# Patient Record
Sex: Female | Born: 1935
Health system: Southern US, Community
[De-identification: ages and names within clinical notes are randomized; demographics above are authoritative.]

## PROBLEM LIST (undated history)

## (undated) DIAGNOSIS — M419 Scoliosis, unspecified: Secondary | ICD-10-CM

## (undated) DIAGNOSIS — K219 Gastro-esophageal reflux disease without esophagitis: Secondary | ICD-10-CM

## (undated) DIAGNOSIS — G8929 Other chronic pain: Secondary | ICD-10-CM

## (undated) DIAGNOSIS — J449 Chronic obstructive pulmonary disease, unspecified: Secondary | ICD-10-CM

## (undated) DIAGNOSIS — E785 Hyperlipidemia, unspecified: Secondary | ICD-10-CM

## (undated) DIAGNOSIS — J189 Pneumonia, unspecified organism: Secondary | ICD-10-CM

## (undated) DIAGNOSIS — R112 Nausea with vomiting, unspecified: Secondary | ICD-10-CM

## (undated) DIAGNOSIS — M1990 Unspecified osteoarthritis, unspecified site: Secondary | ICD-10-CM

## (undated) DIAGNOSIS — J989 Respiratory disorder, unspecified: Secondary | ICD-10-CM

## (undated) DIAGNOSIS — R519 Headache, unspecified: Secondary | ICD-10-CM

## (undated) DIAGNOSIS — R0989 Other specified symptoms and signs involving the circulatory and respiratory systems: Secondary | ICD-10-CM

## (undated) DIAGNOSIS — K5100 Ulcerative (chronic) pancolitis without complications: Secondary | ICD-10-CM

## (undated) DIAGNOSIS — I509 Heart failure, unspecified: Secondary | ICD-10-CM

## (undated) DIAGNOSIS — K279 Peptic ulcer, site unspecified, unspecified as acute or chronic, without hemorrhage or perforation: Secondary | ICD-10-CM

## (undated) DIAGNOSIS — R51 Headache: Secondary | ICD-10-CM

## (undated) DIAGNOSIS — I499 Cardiac arrhythmia, unspecified: Secondary | ICD-10-CM

## (undated) DIAGNOSIS — N390 Urinary tract infection, site not specified: Secondary | ICD-10-CM

## (undated) DIAGNOSIS — K8050 Calculus of bile duct without cholangitis or cholecystitis without obstruction: Secondary | ICD-10-CM

## (undated) DIAGNOSIS — K529 Noninfective gastroenteritis and colitis, unspecified: Secondary | ICD-10-CM

## (undated) DIAGNOSIS — Z9889 Other specified postprocedural states: Secondary | ICD-10-CM

## (undated) DIAGNOSIS — K859 Acute pancreatitis without necrosis or infection, unspecified: Secondary | ICD-10-CM

## (undated) DIAGNOSIS — Z9981 Dependence on supplemental oxygen: Secondary | ICD-10-CM

## (undated) DIAGNOSIS — K449 Diaphragmatic hernia without obstruction or gangrene: Secondary | ICD-10-CM

## (undated) DIAGNOSIS — D649 Anemia, unspecified: Secondary | ICD-10-CM

## (undated) DIAGNOSIS — M549 Dorsalgia, unspecified: Secondary | ICD-10-CM

## (undated) DIAGNOSIS — I10 Essential (primary) hypertension: Secondary | ICD-10-CM

## (undated) HISTORY — DX: Noninfective gastroenteritis and colitis, unspecified: K52.9

## (undated) HISTORY — PX: ABDOMINAL HYSTERECTOMY: SHX81

## (undated) HISTORY — DX: Chronic obstructive pulmonary disease, unspecified: J44.9

## (undated) HISTORY — DX: Hyperlipidemia, unspecified: E78.5

## (undated) HISTORY — DX: Anemia, unspecified: D64.9

## (undated) HISTORY — PX: SPINAL CORD STIMULATOR IMPLANT: SHX2422

## (undated) HISTORY — DX: Essential (primary) hypertension: I10

## (undated) HISTORY — DX: Peptic ulcer, site unspecified, unspecified as acute or chronic, without hemorrhage or perforation: K27.9

## (undated) HISTORY — DX: Gastro-esophageal reflux disease without esophagitis: K21.9

## (undated) HISTORY — DX: Calculus of bile duct without cholangitis or cholecystitis without obstruction: K80.50

## (undated) HISTORY — PX: SPINE SURGERY: SHX786

## (undated) HISTORY — DX: Scoliosis, unspecified: M41.9

## (undated) HISTORY — DX: Diaphragmatic hernia without obstruction or gangrene: K44.9

## (undated) HISTORY — DX: Pneumonia, unspecified organism: J18.9

## (undated) HISTORY — PX: CERVICAL LAMINECTOMY: SHX94

## (undated) HISTORY — DX: Cardiac arrhythmia, unspecified: I49.9

## (undated) HISTORY — PX: BACK SURGERY: SHX140

## (undated) HISTORY — DX: Ulcerative (chronic) pancolitis without complications: K51.00

## (undated) HISTORY — PX: PAIN PUMP IMPLANTATION: SHX330

## (undated) SURGERY — EGD (ESOPHAGOGASTRODUODENOSCOPY)
Anesthesia: Moderate Sedation

---

## 1958-09-28 HISTORY — PX: BREAST CYST EXCISION: SHX579

## 1997-06-30 ENCOUNTER — Ambulatory Visit (HOSPITAL_COMMUNITY): Admission: RE | Admit: 1997-06-30 | Discharge: 1997-07-01 | Payer: Self-pay | Admitting: Neurosurgery

## 1997-07-14 ENCOUNTER — Ambulatory Visit (HOSPITAL_COMMUNITY): Admission: RE | Admit: 1997-07-14 | Discharge: 1997-07-15 | Payer: Self-pay | Admitting: Neurosurgery

## 1997-07-28 ENCOUNTER — Ambulatory Visit (HOSPITAL_COMMUNITY): Admission: RE | Admit: 1997-07-28 | Discharge: 1997-07-28 | Payer: Self-pay | Admitting: Neurosurgery

## 1997-11-08 ENCOUNTER — Encounter: Admission: RE | Admit: 1997-11-08 | Discharge: 1998-02-06 | Payer: Self-pay | Admitting: Neurosurgery

## 1997-12-19 ENCOUNTER — Ambulatory Visit (HOSPITAL_COMMUNITY): Admission: RE | Admit: 1997-12-19 | Discharge: 1997-12-19 | Payer: Self-pay | Admitting: Neurosurgery

## 2000-01-03 ENCOUNTER — Ambulatory Visit (HOSPITAL_COMMUNITY): Admission: RE | Admit: 2000-01-03 | Discharge: 2000-01-03 | Payer: Self-pay | Admitting: Neurosurgery

## 2001-03-01 ENCOUNTER — Ambulatory Visit (HOSPITAL_COMMUNITY): Admission: RE | Admit: 2001-03-01 | Discharge: 2001-03-01 | Payer: Self-pay | Admitting: Neurosurgery

## 2001-03-01 ENCOUNTER — Encounter: Payer: Self-pay | Admitting: Neurosurgery

## 2001-03-19 ENCOUNTER — Encounter: Payer: Self-pay | Admitting: Neurosurgery

## 2001-03-23 ENCOUNTER — Encounter: Payer: Self-pay | Admitting: Neurosurgery

## 2001-03-23 ENCOUNTER — Inpatient Hospital Stay (HOSPITAL_COMMUNITY): Admission: RE | Admit: 2001-03-23 | Discharge: 2001-04-01 | Payer: Self-pay | Admitting: Neurosurgery

## 2001-09-08 ENCOUNTER — Encounter: Payer: Self-pay | Admitting: Neurosurgery

## 2001-09-08 ENCOUNTER — Ambulatory Visit (HOSPITAL_COMMUNITY): Admission: RE | Admit: 2001-09-08 | Discharge: 2001-09-08 | Payer: Self-pay | Admitting: Neurosurgery

## 2001-09-17 ENCOUNTER — Encounter: Payer: Self-pay | Admitting: Neurosurgery

## 2001-09-17 ENCOUNTER — Encounter: Admission: RE | Admit: 2001-09-17 | Discharge: 2001-09-17 | Payer: Self-pay | Admitting: Neurosurgery

## 2001-10-01 ENCOUNTER — Encounter: Payer: Self-pay | Admitting: Neurosurgery

## 2001-10-01 ENCOUNTER — Encounter: Admission: RE | Admit: 2001-10-01 | Discharge: 2001-10-01 | Payer: Self-pay | Admitting: Neurosurgery

## 2001-10-15 ENCOUNTER — Encounter: Admission: RE | Admit: 2001-10-15 | Discharge: 2001-10-15 | Payer: Self-pay | Admitting: Neurosurgery

## 2001-10-15 ENCOUNTER — Encounter: Payer: Self-pay | Admitting: Neurosurgery

## 2002-04-11 ENCOUNTER — Encounter: Admission: RE | Admit: 2002-04-11 | Discharge: 2002-04-11 | Payer: Self-pay | Admitting: Neurosurgery

## 2002-04-11 ENCOUNTER — Encounter: Payer: Self-pay | Admitting: Neurosurgery

## 2002-04-26 ENCOUNTER — Encounter: Payer: Self-pay | Admitting: Neurosurgery

## 2002-04-26 ENCOUNTER — Encounter: Admission: RE | Admit: 2002-04-26 | Discharge: 2002-04-26 | Payer: Self-pay | Admitting: Neurosurgery

## 2002-05-12 ENCOUNTER — Encounter: Payer: Self-pay | Admitting: Neurosurgery

## 2002-05-12 ENCOUNTER — Encounter: Admission: RE | Admit: 2002-05-12 | Discharge: 2002-05-12 | Payer: Self-pay | Admitting: Neurosurgery

## 2003-01-25 ENCOUNTER — Encounter: Admission: RE | Admit: 2003-01-25 | Discharge: 2003-01-25 | Payer: Self-pay | Admitting: Internal Medicine

## 2003-06-30 ENCOUNTER — Ambulatory Visit (HOSPITAL_COMMUNITY): Admission: RE | Admit: 2003-06-30 | Discharge: 2003-06-30 | Payer: Self-pay | Admitting: Neurosurgery

## 2003-08-01 ENCOUNTER — Inpatient Hospital Stay (HOSPITAL_COMMUNITY): Admission: RE | Admit: 2003-08-01 | Discharge: 2003-08-17 | Payer: Self-pay | Admitting: Neurosurgery

## 2003-08-04 ENCOUNTER — Encounter: Payer: Self-pay | Admitting: Neurosurgery

## 2005-06-26 ENCOUNTER — Ambulatory Visit: Payer: Self-pay | Admitting: Internal Medicine

## 2005-07-18 ENCOUNTER — Ambulatory Visit: Payer: Self-pay | Admitting: Internal Medicine

## 2005-07-18 ENCOUNTER — Ambulatory Visit: Payer: Self-pay | Admitting: Cardiovascular Disease

## 2005-08-07 ENCOUNTER — Ambulatory Visit: Payer: Self-pay | Admitting: Internal Medicine

## 2005-11-05 ENCOUNTER — Ambulatory Visit: Payer: Self-pay | Admitting: Internal Medicine

## 2006-01-06 LAB — CONVERTED CEMR LAB

## 2006-01-27 HISTORY — PX: CHOLECYSTECTOMY: SHX55

## 2008-03-13 ENCOUNTER — Encounter: Admission: RE | Admit: 2008-03-13 | Discharge: 2008-03-13 | Payer: Self-pay | Admitting: Cardiology

## 2008-12-01 ENCOUNTER — Encounter: Admission: RE | Admit: 2008-12-01 | Discharge: 2008-12-01 | Payer: Self-pay | Admitting: Cardiology

## 2008-12-05 ENCOUNTER — Telehealth (INDEPENDENT_AMBULATORY_CARE_PROVIDER_SITE_OTHER): Payer: Self-pay | Admitting: *Deleted

## 2009-01-20 ENCOUNTER — Ambulatory Visit: Payer: Self-pay | Admitting: Diagnostic Radiology

## 2009-01-20 ENCOUNTER — Encounter: Payer: Self-pay | Admitting: Emergency Medicine

## 2009-01-20 ENCOUNTER — Inpatient Hospital Stay (HOSPITAL_COMMUNITY): Admission: EM | Admit: 2009-01-20 | Discharge: 2009-01-22 | Payer: Self-pay | Admitting: Internal Medicine

## 2009-01-22 ENCOUNTER — Telehealth: Payer: Self-pay | Admitting: Pulmonary Disease

## 2009-01-23 ENCOUNTER — Telehealth: Payer: Self-pay | Admitting: Internal Medicine

## 2009-02-01 ENCOUNTER — Ambulatory Visit: Payer: Self-pay | Admitting: Pulmonary Disease

## 2009-02-01 ENCOUNTER — Telehealth: Payer: Self-pay | Admitting: Internal Medicine

## 2009-02-01 ENCOUNTER — Ambulatory Visit: Payer: Self-pay | Admitting: Diagnostic Radiology

## 2009-02-01 ENCOUNTER — Ambulatory Visit (HOSPITAL_BASED_OUTPATIENT_CLINIC_OR_DEPARTMENT_OTHER): Admission: RE | Admit: 2009-02-01 | Discharge: 2009-02-01 | Payer: Self-pay | Admitting: Critical Care Medicine

## 2009-02-01 DIAGNOSIS — K279 Peptic ulcer, site unspecified, unspecified as acute or chronic, without hemorrhage or perforation: Secondary | ICD-10-CM | POA: Insufficient documentation

## 2009-02-01 DIAGNOSIS — M1990 Unspecified osteoarthritis, unspecified site: Secondary | ICD-10-CM | POA: Insufficient documentation

## 2009-02-01 DIAGNOSIS — K219 Gastro-esophageal reflux disease without esophagitis: Secondary | ICD-10-CM | POA: Insufficient documentation

## 2009-02-01 DIAGNOSIS — E7800 Pure hypercholesterolemia, unspecified: Secondary | ICD-10-CM | POA: Insufficient documentation

## 2009-02-01 DIAGNOSIS — K449 Diaphragmatic hernia without obstruction or gangrene: Secondary | ICD-10-CM | POA: Insufficient documentation

## 2009-02-01 DIAGNOSIS — I499 Cardiac arrhythmia, unspecified: Secondary | ICD-10-CM | POA: Insufficient documentation

## 2009-02-01 DIAGNOSIS — I10 Essential (primary) hypertension: Secondary | ICD-10-CM | POA: Insufficient documentation

## 2009-02-01 DIAGNOSIS — I5032 Chronic diastolic (congestive) heart failure: Secondary | ICD-10-CM | POA: Insufficient documentation

## 2009-02-01 DIAGNOSIS — J439 Emphysema, unspecified: Secondary | ICD-10-CM | POA: Insufficient documentation

## 2009-02-08 ENCOUNTER — Ambulatory Visit: Payer: Self-pay | Admitting: Critical Care Medicine

## 2009-02-09 ENCOUNTER — Encounter: Payer: Self-pay | Admitting: Critical Care Medicine

## 2009-02-12 ENCOUNTER — Ambulatory Visit (HOSPITAL_BASED_OUTPATIENT_CLINIC_OR_DEPARTMENT_OTHER): Admission: RE | Admit: 2009-02-12 | Discharge: 2009-02-12 | Payer: Self-pay | Admitting: Internal Medicine

## 2009-02-12 ENCOUNTER — Ambulatory Visit: Payer: Self-pay | Admitting: Diagnostic Radiology

## 2009-02-12 ENCOUNTER — Telehealth: Payer: Self-pay | Admitting: Critical Care Medicine

## 2009-02-12 ENCOUNTER — Ambulatory Visit: Payer: Self-pay | Admitting: Internal Medicine

## 2009-02-12 DIAGNOSIS — M810 Age-related osteoporosis without current pathological fracture: Secondary | ICD-10-CM | POA: Insufficient documentation

## 2009-02-15 ENCOUNTER — Telehealth: Payer: Self-pay | Admitting: Internal Medicine

## 2009-02-15 ENCOUNTER — Encounter: Payer: Self-pay | Admitting: Internal Medicine

## 2009-02-16 ENCOUNTER — Telehealth: Payer: Self-pay | Admitting: Internal Medicine

## 2009-02-21 ENCOUNTER — Telehealth: Payer: Self-pay | Admitting: Internal Medicine

## 2009-02-22 ENCOUNTER — Telehealth: Payer: Self-pay | Admitting: Internal Medicine

## 2009-02-22 ENCOUNTER — Ambulatory Visit: Payer: Self-pay | Admitting: Critical Care Medicine

## 2009-02-23 ENCOUNTER — Telehealth (INDEPENDENT_AMBULATORY_CARE_PROVIDER_SITE_OTHER): Payer: Self-pay | Admitting: *Deleted

## 2009-03-01 ENCOUNTER — Ambulatory Visit: Payer: Self-pay | Admitting: Critical Care Medicine

## 2009-03-02 ENCOUNTER — Telehealth: Payer: Self-pay | Admitting: Internal Medicine

## 2009-03-08 ENCOUNTER — Telehealth: Payer: Self-pay | Admitting: Critical Care Medicine

## 2009-03-15 ENCOUNTER — Ambulatory Visit: Payer: Self-pay | Admitting: Critical Care Medicine

## 2009-03-15 ENCOUNTER — Encounter: Payer: Self-pay | Admitting: Adult Health

## 2009-03-15 LAB — CONVERTED CEMR LAB
BUN: 13 mg/dL (ref 6–23)
CO2: 36 meq/L — ABNORMAL HIGH (ref 19–32)
Calcium: 8.4 mg/dL (ref 8.4–10.5)
Chloride: 102 meq/L (ref 96–112)
Creatinine, Ser: 0.8 mg/dL (ref 0.4–1.2)
GFR calc non Af Amer: 73.01 mL/min (ref >60)
Glucose, Bld: 91 mg/dL (ref 70–99)
Potassium: 4.5 meq/L (ref 3.5–5.1)
Sodium: 142 meq/L (ref 135–145)

## 2009-03-16 ENCOUNTER — Telehealth: Payer: Self-pay | Admitting: Critical Care Medicine

## 2009-03-20 LAB — CONVERTED CEMR LAB: Vit D, 25-Hydroxy: 15 ng/mL — ABNORMAL LOW (ref 30–89)

## 2009-04-02 ENCOUNTER — Encounter: Payer: Self-pay | Admitting: Internal Medicine

## 2009-04-02 ENCOUNTER — Encounter (HOSPITAL_COMMUNITY): Admission: RE | Admit: 2009-04-02 | Discharge: 2009-07-01 | Payer: Self-pay | Admitting: Critical Care Medicine

## 2009-04-04 ENCOUNTER — Ambulatory Visit (HOSPITAL_COMMUNITY): Admission: RE | Admit: 2009-04-04 | Discharge: 2009-04-04 | Payer: Self-pay | Admitting: Critical Care Medicine

## 2009-04-09 ENCOUNTER — Ambulatory Visit: Payer: Self-pay | Admitting: Internal Medicine

## 2009-04-09 LAB — CONVERTED CEMR LAB
ALT: 12 units/L (ref 0–35)
AST: 20 units/L (ref 0–37)
Albumin: 3.7 g/dL (ref 3.5–5.2)
Alkaline Phosphatase: 77 units/L (ref 39–117)
BUN: 15 mg/dL (ref 6–23)
Basophils Absolute: 0 10*3/uL (ref 0.0–0.1)
Basophils Relative: 0 % (ref 0–1)
Bilirubin, Direct: <0.1 mg/dL (ref 0.0–0.3)
CO2: 30 meq/L (ref 19–32)
Calcium: 8.2 mg/dL — ABNORMAL LOW (ref 8.4–10.5)
Chloride: 102 meq/L (ref 96–112)
Cholesterol: 151 mg/dL (ref 0–200)
Creatinine, Ser: 0.78 mg/dL (ref 0.40–1.20)
Eosinophils Absolute: 0.1 10*3/uL (ref 0.0–0.7)
Eosinophils Relative: 3 % (ref 0–5)
Glucose, Bld: 79 mg/dL (ref 70–99)
HCT: 35.2 % — ABNORMAL LOW (ref 36.0–46.0)
HDL: 39 mg/dL — ABNORMAL LOW (ref >39)
Hemoglobin: 10.7 g/dL — ABNORMAL LOW (ref 12.0–15.0)
LDL Cholesterol: 87 mg/dL (ref 0–99)
Lymphocytes Relative: 25 % (ref 12–46)
Lymphs Abs: 1 10*3/uL (ref 0.7–4.0)
MCHC: 30.4 g/dL (ref 30.0–36.0)
MCV: 94.9 fL (ref 78.0–100.0)
Monocytes Absolute: 0.4 10*3/uL (ref 0.1–1.0)
Monocytes Relative: 10 % (ref 3–12)
Neutro Abs: 2.5 10*3/uL (ref 1.7–7.7)
Neutrophils Relative %: 63 % (ref 43–77)
Platelets: 159 10*3/uL (ref 150–400)
Potassium: 4.6 meq/L (ref 3.5–5.3)
RBC: 3.71 M/uL — ABNORMAL LOW (ref 3.87–5.11)
RDW: 14.2 % (ref 11.5–15.5)
Sodium: 142 meq/L (ref 135–145)
TSH: 2.074 microintl units/mL (ref 0.350–4.500)
Total Bilirubin: 0.2 mg/dL — ABNORMAL LOW (ref 0.3–1.2)
Total CHOL/HDL Ratio: 3.9
Total Protein: 5.8 g/dL — ABNORMAL LOW (ref 6.0–8.3)
Triglycerides: 125 mg/dL (ref <150)
VLDL: 25 mg/dL (ref 0–40)
Vit D, 1,25-Dihydroxy: 22 — ABNORMAL LOW (ref 30–89)
WBC: 4.1 10*3/uL (ref 4.0–10.5)

## 2009-04-12 ENCOUNTER — Ambulatory Visit: Payer: Self-pay | Admitting: Internal Medicine

## 2009-04-12 ENCOUNTER — Telehealth: Payer: Self-pay | Admitting: Critical Care Medicine

## 2009-04-12 DIAGNOSIS — I279 Pulmonary heart disease, unspecified: Secondary | ICD-10-CM | POA: Insufficient documentation

## 2009-04-13 LAB — CONVERTED CEMR LAB: Pro B Natriuretic peptide (BNP): 111 pg/mL — ABNORMAL HIGH (ref 0.0–100.0)

## 2009-04-16 ENCOUNTER — Ambulatory Visit: Payer: Self-pay | Admitting: Internal Medicine

## 2009-04-16 LAB — HM MAMMOGRAPHY: HM Mammogram: NORMAL

## 2009-04-16 LAB — CONVERTED CEMR LAB

## 2009-05-03 ENCOUNTER — Telehealth: Payer: Self-pay | Admitting: Internal Medicine

## 2009-05-10 ENCOUNTER — Ambulatory Visit: Payer: Self-pay | Admitting: Critical Care Medicine

## 2009-05-10 DIAGNOSIS — R892 Abnormal level of other drugs, medicaments and biological substances in specimens from other organs, systems and tissues: Secondary | ICD-10-CM | POA: Insufficient documentation

## 2009-05-19 ENCOUNTER — Ambulatory Visit: Payer: Self-pay | Admitting: Diagnostic Radiology

## 2009-05-19 ENCOUNTER — Emergency Department (HOSPITAL_BASED_OUTPATIENT_CLINIC_OR_DEPARTMENT_OTHER)
Admission: EM | Admit: 2009-05-19 | Discharge: 2009-05-19 | Payer: Self-pay | Source: Home / Self Care | Admitting: Emergency Medicine

## 2009-06-19 ENCOUNTER — Ambulatory Visit: Payer: Self-pay | Admitting: Internal Medicine

## 2009-06-21 ENCOUNTER — Encounter: Payer: Self-pay | Admitting: Critical Care Medicine

## 2009-07-02 ENCOUNTER — Encounter (HOSPITAL_COMMUNITY): Admission: RE | Admit: 2009-07-02 | Discharge: 2009-07-05 | Payer: Self-pay | Admitting: Critical Care Medicine

## 2009-07-06 ENCOUNTER — Inpatient Hospital Stay (HOSPITAL_COMMUNITY): Admission: EM | Admit: 2009-07-06 | Discharge: 2009-07-09 | Payer: Self-pay | Admitting: Emergency Medicine

## 2009-07-07 ENCOUNTER — Encounter (INDEPENDENT_AMBULATORY_CARE_PROVIDER_SITE_OTHER): Payer: Self-pay | Admitting: Internal Medicine

## 2009-07-11 ENCOUNTER — Telehealth: Payer: Self-pay | Admitting: Internal Medicine

## 2009-07-19 ENCOUNTER — Ambulatory Visit: Payer: Self-pay | Admitting: Critical Care Medicine

## 2009-07-19 ENCOUNTER — Ambulatory Visit: Payer: Self-pay | Admitting: Internal Medicine

## 2009-07-20 ENCOUNTER — Encounter: Payer: Self-pay | Admitting: Internal Medicine

## 2009-07-24 ENCOUNTER — Telehealth: Payer: Self-pay | Admitting: Internal Medicine

## 2009-07-25 ENCOUNTER — Encounter: Payer: Self-pay | Admitting: Internal Medicine

## 2009-07-26 ENCOUNTER — Ambulatory Visit: Payer: Self-pay | Admitting: Internal Medicine

## 2009-07-26 ENCOUNTER — Ambulatory Visit: Payer: Self-pay | Admitting: Diagnostic Radiology

## 2009-07-26 ENCOUNTER — Ambulatory Visit (HOSPITAL_BASED_OUTPATIENT_CLINIC_OR_DEPARTMENT_OTHER): Admission: RE | Admit: 2009-07-26 | Discharge: 2009-07-26 | Payer: Self-pay | Admitting: Internal Medicine

## 2009-07-27 ENCOUNTER — Encounter: Payer: Self-pay | Admitting: Internal Medicine

## 2009-07-27 ENCOUNTER — Telehealth: Payer: Self-pay | Admitting: Internal Medicine

## 2009-08-02 ENCOUNTER — Telehealth: Payer: Self-pay | Admitting: Internal Medicine

## 2009-08-17 ENCOUNTER — Ambulatory Visit: Payer: Self-pay | Admitting: Internal Medicine

## 2009-08-17 ENCOUNTER — Telehealth: Payer: Self-pay | Admitting: Internal Medicine

## 2009-08-17 DIAGNOSIS — H9190 Unspecified hearing loss, unspecified ear: Secondary | ICD-10-CM | POA: Insufficient documentation

## 2009-08-17 DIAGNOSIS — M542 Cervicalgia: Secondary | ICD-10-CM | POA: Insufficient documentation

## 2009-08-20 ENCOUNTER — Encounter (INDEPENDENT_AMBULATORY_CARE_PROVIDER_SITE_OTHER): Payer: Self-pay | Admitting: *Deleted

## 2009-08-24 ENCOUNTER — Encounter: Payer: Self-pay | Admitting: Internal Medicine

## 2009-08-30 ENCOUNTER — Ambulatory Visit: Payer: Self-pay | Admitting: Cardiology

## 2009-09-26 ENCOUNTER — Ambulatory Visit: Payer: Self-pay | Admitting: Diagnostic Radiology

## 2009-09-26 ENCOUNTER — Ambulatory Visit (HOSPITAL_BASED_OUTPATIENT_CLINIC_OR_DEPARTMENT_OTHER): Admission: RE | Admit: 2009-09-26 | Discharge: 2009-09-26 | Payer: Self-pay | Admitting: Internal Medicine

## 2009-09-26 ENCOUNTER — Ambulatory Visit: Payer: Self-pay | Admitting: Family

## 2009-09-26 LAB — CONVERTED CEMR LAB: Rapid Strep: NEGATIVE

## 2009-09-27 ENCOUNTER — Encounter: Payer: Self-pay | Admitting: Adult Health

## 2009-09-28 ENCOUNTER — Ambulatory Visit: Payer: Self-pay | Admitting: Diagnostic Radiology

## 2009-09-28 ENCOUNTER — Ambulatory Visit: Payer: Self-pay | Admitting: Internal Medicine

## 2009-09-28 ENCOUNTER — Telehealth: Payer: Self-pay | Admitting: Critical Care Medicine

## 2009-09-28 ENCOUNTER — Ambulatory Visit (HOSPITAL_BASED_OUTPATIENT_CLINIC_OR_DEPARTMENT_OTHER): Admission: RE | Admit: 2009-09-28 | Discharge: 2009-09-28 | Payer: Self-pay | Admitting: Internal Medicine

## 2009-09-28 DIAGNOSIS — R51 Headache: Secondary | ICD-10-CM | POA: Insufficient documentation

## 2009-09-28 DIAGNOSIS — R05 Cough: Secondary | ICD-10-CM | POA: Insufficient documentation

## 2009-09-28 DIAGNOSIS — R519 Headache, unspecified: Secondary | ICD-10-CM | POA: Insufficient documentation

## 2009-09-28 DIAGNOSIS — R059 Cough, unspecified: Secondary | ICD-10-CM | POA: Insufficient documentation

## 2009-10-04 ENCOUNTER — Ambulatory Visit: Payer: Self-pay | Admitting: Critical Care Medicine

## 2009-10-05 ENCOUNTER — Telehealth: Payer: Self-pay | Admitting: Critical Care Medicine

## 2009-10-18 ENCOUNTER — Encounter: Payer: Self-pay | Admitting: Internal Medicine

## 2009-10-23 ENCOUNTER — Encounter: Payer: Self-pay | Admitting: Internal Medicine

## 2009-10-29 ENCOUNTER — Telehealth: Payer: Self-pay | Admitting: Internal Medicine

## 2009-10-31 ENCOUNTER — Encounter: Payer: Self-pay | Admitting: Critical Care Medicine

## 2009-11-01 ENCOUNTER — Ambulatory Visit: Payer: Self-pay | Admitting: Critical Care Medicine

## 2009-11-01 ENCOUNTER — Encounter: Payer: Self-pay | Admitting: Internal Medicine

## 2009-11-01 ENCOUNTER — Ambulatory Visit (HOSPITAL_BASED_OUTPATIENT_CLINIC_OR_DEPARTMENT_OTHER): Admission: RE | Admit: 2009-11-01 | Discharge: 2009-11-01 | Payer: Self-pay | Admitting: Critical Care Medicine

## 2009-11-01 ENCOUNTER — Ambulatory Visit: Payer: Self-pay | Admitting: Diagnostic Radiology

## 2009-11-14 ENCOUNTER — Telehealth: Payer: Self-pay | Admitting: Internal Medicine

## 2009-12-02 ENCOUNTER — Inpatient Hospital Stay (HOSPITAL_COMMUNITY): Admission: EM | Admit: 2009-12-02 | Discharge: 2009-12-07 | Payer: Self-pay | Admitting: Emergency Medicine

## 2009-12-04 ENCOUNTER — Encounter: Payer: Self-pay | Admitting: Gastroenterology

## 2009-12-14 ENCOUNTER — Telehealth: Payer: Self-pay | Admitting: Critical Care Medicine

## 2009-12-24 ENCOUNTER — Telehealth: Payer: Self-pay | Admitting: Internal Medicine

## 2009-12-27 ENCOUNTER — Ambulatory Visit: Payer: Self-pay | Admitting: Internal Medicine

## 2009-12-27 DIAGNOSIS — K859 Acute pancreatitis without necrosis or infection, unspecified: Secondary | ICD-10-CM | POA: Insufficient documentation

## 2009-12-27 DIAGNOSIS — D649 Anemia, unspecified: Secondary | ICD-10-CM | POA: Insufficient documentation

## 2009-12-28 ENCOUNTER — Encounter (INDEPENDENT_AMBULATORY_CARE_PROVIDER_SITE_OTHER): Payer: Self-pay | Admitting: *Deleted

## 2009-12-31 ENCOUNTER — Encounter: Payer: Self-pay | Admitting: Internal Medicine

## 2010-01-01 ENCOUNTER — Telehealth: Payer: Self-pay | Admitting: Internal Medicine

## 2010-01-03 ENCOUNTER — Ambulatory Visit: Payer: Self-pay | Admitting: Critical Care Medicine

## 2010-01-17 ENCOUNTER — Encounter: Payer: Self-pay | Admitting: Internal Medicine

## 2010-01-24 ENCOUNTER — Inpatient Hospital Stay (HOSPITAL_COMMUNITY)
Admission: EM | Admit: 2010-01-24 | Discharge: 2010-01-28 | Payer: Self-pay | Source: Home / Self Care | Attending: Internal Medicine | Admitting: Internal Medicine

## 2010-01-31 ENCOUNTER — Encounter: Payer: Self-pay | Admitting: Internal Medicine

## 2010-02-05 ENCOUNTER — Ambulatory Visit
Admission: RE | Admit: 2010-02-05 | Discharge: 2010-02-05 | Payer: Self-pay | Source: Home / Self Care | Attending: Internal Medicine | Admitting: Internal Medicine

## 2010-02-07 ENCOUNTER — Ambulatory Visit
Admission: RE | Admit: 2010-02-07 | Discharge: 2010-02-07 | Payer: Self-pay | Source: Home / Self Care | Attending: Critical Care Medicine | Admitting: Critical Care Medicine

## 2010-02-07 ENCOUNTER — Encounter: Payer: Self-pay | Admitting: Critical Care Medicine

## 2010-02-07 ENCOUNTER — Telehealth: Payer: Self-pay | Admitting: Critical Care Medicine

## 2010-02-07 ENCOUNTER — Ambulatory Visit (HOSPITAL_BASED_OUTPATIENT_CLINIC_OR_DEPARTMENT_OTHER)
Admission: RE | Admit: 2010-02-07 | Discharge: 2010-02-07 | Payer: Self-pay | Source: Home / Self Care | Attending: Critical Care Medicine | Admitting: Critical Care Medicine

## 2010-02-08 ENCOUNTER — Telehealth: Payer: Self-pay | Admitting: Critical Care Medicine

## 2010-02-08 LAB — CONVERTED CEMR LAB
ALT: 16 units/L (ref 0–35)
AST: 19 units/L (ref 0–37)
Albumin: 4 g/dL (ref 3.5–5.2)
Alkaline Phosphatase: 94 units/L (ref 39–117)
BUN: 14 mg/dL (ref 6–23)
Basophils Absolute: 0 10*3/uL (ref 0.0–0.1)
Basophils Relative: 0 % (ref 0–1)
Bilirubin, Direct: 0.1 mg/dL (ref 0.0–0.3)
CO2: 32 meq/L (ref 19–32)
Calcium: 9 mg/dL (ref 8.4–10.5)
Chloride: 100 meq/L (ref 96–112)
Creatinine, Ser: 0.67 mg/dL (ref 0.40–1.20)
Eosinophils Absolute: 0.1 10*3/uL (ref 0.0–0.7)
Eosinophils Relative: 2 % (ref 0–5)
Glucose, Bld: 78 mg/dL (ref 70–99)
HCT: 35.6 % — ABNORMAL LOW (ref 36.0–46.0)
Hemoglobin: 11.5 g/dL — ABNORMAL LOW (ref 12.0–15.0)
Indirect Bilirubin: 0.2 mg/dL (ref 0.0–0.9)
Lymphocytes Relative: 20 % (ref 12–46)
Lymphs Abs: 1.1 10*3/uL (ref 0.7–4.0)
MCHC: 32.3 g/dL (ref 30.0–36.0)
MCV: 95.2 fL (ref 78.0–100.0)
Monocytes Absolute: 0.5 10*3/uL (ref 0.1–1.0)
Monocytes Relative: 9 % (ref 3–12)
Neutro Abs: 3.7 10*3/uL (ref 1.7–7.7)
Neutrophils Relative %: 69 % (ref 43–77)
Platelets: 237 10*3/uL (ref 150–400)
Potassium: 4.2 meq/L (ref 3.5–5.3)
RBC: 3.74 M/uL — ABNORMAL LOW (ref 3.87–5.11)
RDW: 14.9 % (ref 11.5–15.5)
Sodium: 142 meq/L (ref 135–145)
Total Bilirubin: 0.3 mg/dL (ref 0.3–1.2)
Total Protein: 6.4 g/dL (ref 6.0–8.3)
WBC: 5.3 10*3/uL (ref 4.0–10.5)

## 2010-02-20 ENCOUNTER — Ambulatory Visit (HOSPITAL_COMMUNITY)
Admission: RE | Admit: 2010-02-20 | Discharge: 2010-02-20 | Payer: Self-pay | Source: Home / Self Care | Attending: Gastroenterology | Admitting: Gastroenterology

## 2010-02-20 ENCOUNTER — Encounter: Payer: Self-pay | Admitting: Internal Medicine

## 2010-02-21 ENCOUNTER — Encounter: Payer: Self-pay | Admitting: Internal Medicine

## 2010-02-21 ENCOUNTER — Ambulatory Visit
Admission: RE | Admit: 2010-02-21 | Discharge: 2010-02-21 | Payer: Self-pay | Source: Home / Self Care | Attending: Critical Care Medicine | Admitting: Critical Care Medicine

## 2010-02-22 NOTE — H&P (Signed)
[name] [name], [name] [name]               ACCOUNT NO.:  [id number]  MEDICAL RECORD NO.:  [id number]          PATIENT TYPE:  INP  LOCATION:  2040                         FACILITY:  [facility]  PHYSICIAN:  Massie Maroon, MD        DATE OF BIRTH:  05/31/1925  DATE OF ADMISSION:  01/24/2010 DATE OF DISCHARGE:                             HISTORY & PHYSICAL   CHIEF COMPLAINT:  Abdominal pain.  HISTORY OF PRESENT ILLNESS:  An 77 year old female with complaints of nausea, vomiting, generalized abdominal pain, and diarrhea.  Patient denies any fever, chills, constipation, bright red blood per rectum. She has had some black stool, but is on iron.  She has not had any recent colonoscopy.  She was recently discharged on 12/09/2009, after admission for mild and acute pancreatitis of unclear etiology.  At that time, on CT scan, there was some colonic wall thickening suspicious for chronic inflammation.  Patient presented to the ED and had a CT scan ofthe abdomen and pelvis which showed pancolitis with infectious or inflammatory process.  Small amount of free pelvic fluid likely related colitis.  There was no evidence of pancreatitis.  There was evidence of prior cholecystectomy with unchanged mild intra and extrahepatic biliary ductal dilation.  There was scoliosis and a small left pleural effusion. Patient will be admitted for workup of colitis.  PAST MEDICAL HISTORY: 1. COPD. 2. Pneumonia. 3. Hypertension. 4. Hyperlipidemia. 5. CHF (ejection fraction 60-65% on cardiac 2-D echo July 07, 2009),     aortic sclerosis. 6. ? Cardiac arrhythmia. 7. Chronic back pain, history of spondylosis. 8. Osteoarthritis. 9. Osteoporosis (status post last treatment March 07, 2009). 10.GERD. 11.Hiatal hernia. 12.? Colitis. 13.Pancreatitis. 14.? MAC infection on CT chest (September 28, 2009).  PAST SURGICAL HISTORY: 1. 2008 cholecystectomy; 2007, 1995, 1970s, back surgeries; 1970s     hysterectomy; 1970s neck  surgery. 2. Spinal cord stimulator surgery.  SOCIAL HISTORY:  The patient does not smoke or drink.  She lives by herself.  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  FAMILY HISTORY:  Diverticulitis, no history of colon cancer, father had heart disease, mother had uterine cancer,  ALLERGIES:  CODEINE, MORPHINE, RECLAST, IV CONTRAST DYE.  MEDICATIONS:  Tandem one p.o. daily, Fioricet 325/50/40 mg one p.o. q.8 h p.r.n., Zofran 8 mg p.o. q.4 h p.r.n., promethazine 25 mg p.o. daily as needed, Dilaudid 4 mg p.o. daily p.r.n., Symbicort 160/4.5 two puffs b.i.d., Ventolin 1 puff t.i.d. p.r.n., Spiriva 18 mcg 1 puff daily, gabapentin 100 mg one p.o. t.i.d., Bystolic 5 mg one half p.o. daily, omeprazole 20 mg one p.o. daily, enteric-coated aspirin 81 mg p.o. daily, vitamin D 50,000 international units one p.o. q. week, vitamin C 500 mg one p.o. daily, Os-Cal plus D one p.o. daily.  REVIEW OF SYSTEMS:  Negative for all 10 organ systems except for pertinent positive as stated above.  PHYSICAL EXAMINATION:  VITAL SIGNS: Temperature 100.0, pulse 106, blood pressure is 161/72, pulse ox is 95-100%, on room air.  HEENT: Anicteric, EOMI, no nystagmus, pupils 1.5 mm, symmetric, direct, consensual near reflexes intact.  Mucous membranes moist.  NECK: No JVD, no bruit, no thyromegaly, no adenopathy.  HEART: Regular rate and rhythm.  S1, S2. No murmurs, gallops, or rubs.  LUNGS: Clear to auscultation bilaterally. ABDOMEN: Soft, nontender, nondistended.  Positive bowel sounds.  There is part of her back pain apparatus relief right side of the belly. EXTREMITIES: No cyanosis, clubbing, or edema.  SKIN: No rashes.  No Cullen sign.  LYMPH NODES: No adenopathy.  NEURO: Nonfocal.  LABORATORY DATA:  BUN 11, creatinine 0.8.  Sodium 135, potassium 3.5, AST 229, ALT 327, alk phos 217, total bilirubin 0.4, albumin 3.7, calcium 8.9, PTT 30.  PT 12.5, INR 0.91.  WBC 8.0, hemoglobin 14.1, platelet  count 129.  Urine microscopic shows rbc's 3-6, lipase 19, lactic acid 1.1.  Cardiac markers show CPK of 46, CK-MB 3.4, troponin-I 0.15.  ASSESSMENT/PLAN: 1. Colitis:  Check stool for fecal leukocytes, culture, C diff, ova     and parasites.  Please involve GI in the morning for further     evaluation.  Patient will be treated with Cipro 400 mg IV b.i.d.     and Flagyl 500 mg IV t.i.d.  Patient will continue on omeprazole     for GI prophylaxis. 2. Tachycardia:  Probably related to pain and possibly some     dehydration.  We will check cardiac markers every 6 hours x3 sets.     Check a TSH.  If tachycardia continues even after hydration, and     better controlled pain, please check a cardiac 2-D echo to rule out     cardiomyopathy. 3. Chronic back pain:  Continue Dilaudid. 4. Chronic headache.  Continue Fioricet. 5. Chronic obstructive pulmonary disease.  Continue Spiriva,     Symbicort, Ventolin. 6. History of congestive heart failure (diastolic):  Continue     Bystolic, continue aspirin. 7. Hypertension:  Patient did not take her Bystolic earlier today.     This may be also a factor in her tachycardia and uncontrolled     hypertension. 8. DVT prophylaxis, SCDs.     Massie Maroon, MD     JYK/MEDQ  D:  01/25/2010  T:  01/25/2010  Job:  [id number]  Electronically Signed by Pearson Grippe MD on 02/22/2010 09:11:14 PM

## 2010-02-27 HISTORY — PX: ERCP W/ SPHICTEROTOMY: SHX1523

## 2010-02-28 NOTE — Assessment & Plan Note (Signed)
Summary: 1 month follow up/mhf--Rm 2   Vital Signs:  Patient profile:   77 year old female Height:      62 inches Weight:      128 pounds BMI:     23.50 Temp:     98.0 degrees F oral Pulse rate:   72 / minute Pulse rhythm:   regular Resp:     16 per minute BP sitting:   120 / 60  (left arm) Cuff size:   regular  Vitals Entered By: Mervin Kung CMA Duncan Dull) (August 17, 2009 2:14 PM) CC: Room 2   1 month follow up.  Pt states she can't hear well in left ear x 2 weeks. Stabbing pain in left side of neck, arm and chest x years but now getting worse. Scratched lower left leg on cement block this Tuesday.  Is Patient Diabetic? No  Does patient need assistance? Functional Status Cook/clean Comments Pt has completed Levaquin. Pt only takes Dilaudid once a day.   Primary Care Provider:  [name] DO  CC:  Room 2   1 month follow up.  Pt states she can't hear well in left ear x 2 weeks. Stabbing pain in left side of neck and arm and chest x years but now getting worse. Scratched lower left leg on cement block this Tuesday. [name]  History of Present Illness: 77 y/o female for f/u intermittent left sided chest pain, left sided neck pain. and left arm pain symptoms last for a few seconds no shortness of breath no change with taking deep breath left pain is not associated  unexplained hearing loss on left ear    Allergies: 1)  ! Codeine 2)  ! Morphine 3)  ! * Reclast 4)  ! * Contrast Dye  Past History:  Past Medical History: OSTEOPOROSIS -  Reclast rx 03/07/09 OSTEOARTHRITIS (ICD-715.90)  HIATAL HERNIA (ICD-553.3) GERD (ICD-530.81)  PNEUMONIA (ICD-486) COPD (ICD-496)  HYPOTENSION (ICD-458.9)  PEPTIC ULCER DISEASE (ICD-533.90) CARDIAC ARRHYTHMIA (ICD-427.9) HYPERCHOLESTEROLEMIA (ICD-272.0)  HEART FAILURE (ICD-428.9) HYPERTENSION (ICD-401.9) Hx of Spondylolysis with intractable back pain    Past Surgical History: gallbladder - 2008  hysterectomy - 1970s back  surgeries - 1970s, 1995, 2007  neck surgery - 1970s  spinal cord stimulator     Family History: heart disease - father cancer - mother [name] Family History Hypertension       Social History: former smoker, quit in 1990, x67yrs, 1ppd. no alcohol widow    3 children  One grandchild at care link = Angelia lives with great-granddaughter  retired: worked for [employer] of [employer]  Physical Exam  General:  alert, well-developed, and well-nourished.   Neck:  supple and no masses.   Lungs:  dry bibasilar crackles, normal respiratory effort and normal breath sounds.   Heart:  normal rate, regular rhythm, and no gallop.   Extremities:  trace left pedal edema and trace right pedal edema.     Impression & Recommendations:  Problem # 1:  NECK PAIN, LEFT (ICD-723.1) Intermittent left neck pain.   question radiculopathy.  no MRI due to pain pump.  trial of gabapentin.    Her updated medication list for this problem includes:    Flexeril 10 Mg Tabs (Cyclobenzaprine hcl) .[name]... Take 1 tablet by mouth two times a day    Dilaudid 4 Mg Tabs (Hydromorphone hcl) .[name]... Take 1 tablet by mouth two times a day    Bayer Low Strength 81 Mg Tbec (Aspirin) .[name]... Take 1 tablet by mouth  once a day  Orders: Neurosurgeon Referral (Neurosurgeon)  Problem # 2:  HEARING LOSS, LEFT EAR (ICD-389.9) unexplained left hearing loss. no cerumen  Pt requests referral to Dr. Suszanne Conners  Orders: ENT Referral (ENT)  Problem # 3:  CHEST PAIN, ATYPICAL (ICD-786.59) EKG is normal.   I doubt cardiac etiology I suspect musculoskelatal etiology  Complete Medication List: 1)  Bystolic 5 Mg Tabs (Nebivolol hcl) .... One half by mouth once daily 2)  Omeprazole 40 Mg Cpdr (Omeprazole) .... One by mouth two times a day 30 min before meals 3)  Flexeril 10 Mg Tabs (Cyclobenzaprine hcl) .... Take 1 tablet by mouth two times a day 4)  Dilaudid 4 Mg Tabs (Hydromorphone hcl) .... Take 1 tablet by mouth two times a day 5)  Bayer Low  Strength 81 Mg Tbec (Aspirin) .... Take 1 tablet by mouth once a day 6)  Spiriva Handihaler 18 Mcg Caps (Tiotropium bromide monohydrate) .... Inhale contents of 1 capsule once a day 7)  Symbicort 160-4.5 Mcg/act Aero (Budesonide-formoterol fumarate) .... Inhale 2 puffs two times a day 8)  Caltrate 600+d Plus 600-400 Mg-unit Tabs (Calcium carbonate-vit d-min) .... One by mouth two times a day with meals 9)  Dilaudid Pump  .... Continuous 10)  Aerochamber Mv Misc (Spacer/aero-holding chambers) .... Use with symbicort 11)  Vitamin D3 50000 Units  .... One by mouth once weekly 12)  Ventolin Hfa 108 (90 Base) Mcg/act Aers (Albuterol sulfate) .... 2 puffs by mouth once daily as needed 13)  Pulse Oximeter  .... Use as needed 14)  Majic Mouthwash  .... Swish and swollow 1-2 teaspoonfuls every 4 hours as directed 15)  Gabapentin 100 Mg Caps (Gabapentin) 16)  Gabapentin 100 Mg Caps (Gabapentin) .... One by mouth three times a day  Patient Instructions: 1)  Please schedule a follow-up appointment in 6 weeks Prescriptions: GABAPENTIN 100 MG CAPS (GABAPENTIN) one by mouth three times a day  #90 x 1   Entered and Authorized by:   D. Thomos Lemons DO   Signed by:   D. Thomos Lemons DO on 08/17/2009   Method used:   Electronically to        [facility] (retail)       [phone]       [place], [place]  [id number]       Ph: [phone]       Fax: [phone]   RxID:   [id number]

## 2010-02-28 NOTE — Letter (Signed)
Summary: CMN for Oxygen/[facility]  CMN for Oxygen/[facility]   Imported By: Lanelle Bal 11/08/2009 12:44:52  _____________________________________________________________________  External Attachment:    Type:   Image     Comment:   External Document

## 2010-02-28 NOTE — Progress Notes (Signed)
Summary: please call grand daughter   Phone Note Call from Patient   Caller: PATIENT GRAND DAUGHTER [name] Call For: Ninette Cotta  Summary of Call: GRAND DAUGHTER IS HERE WITH [place]ana TO SEE DR WRIGHT TODAY.  [facility]  HAS CALLED SEVERAL TIMES ABOUT A MED THAT IS MAKING HER HAVE ARRYTHMIA.  THEY HAVE STATED THEY ARE NOT GETTING A RETURN PHONE CALL.  THEY DID GET A CALL FROM [facility] YESTERDAY STATING THEY WOULD HAVE TO WAIT TILL TODAY SINCE IT WAS YOUR HALF DAY.  GRAND DAUGHTER IS CONCERNED THAT SHE HAS BEEN WAITING FOR A REPLY FOR THREE DAYS.   GRAND DAUGHTER WONDERED IF SHE COULD SPEAK WITH YOU WHILE SHE IS HERE TO SEE DR Delford Field  Initial call taken by: Roselle Locus,  February 22, 2009 10:23 AM  Follow-up for Phone Call        grand daughter stopped at desk after appt with Dr Delford Field and said Dr Delford Field changed all the meds.  Please look at the the changes and she also needs some blood work for the reclast and grand daughter would like to speak with you when you can give her a call [name] [phone] Roselle Locus  February 22, 2009 11:34 AM  Additional Follow-up for Phone Call Additional follow up Details #1::        I will try to call her around 6:00 pm when I am finished seeing patient for the day Additional Follow-up by: D. Thomos Lemons DO,  February 22, 2009 1:23 PM    Additional Follow-up for Phone Call Additional follow up Details #2::    called grand daughter to advise Dr Artist Pais will call her this evening after 6 pm and to apologize for mis routing the message on the 26th  that did not go to Dr Artist Pais.    Roselle Locus  February 22, 2009 1:29 PM  Additional Follow-up for Phone Call Additional follow up Details #3:: Details for Additional Follow-up Action Taken: spoke with grand daughter.  she would like to reschedule Reclast infusion until pt is feeling better.   last BMET was in Dec.  she will need to come in for BMET, Mg, Phos before infusion we can add above labs to ones scheduled in  march and postpone reclast infusion until march Additional Follow-up by: D. Thomos Lemons DO,  February 22, 2009 6:57 PM  Spoke with patients grandaughter [name] she was advised per Dr Artist Pais instructions. Reclast will be scheduled for March and any blood work needed will be drawn around the Reclst appointment Glendell Docker CMA  February 23, 2009 4:53 PM

## 2010-02-28 NOTE — Progress Notes (Signed)
Summary: message from [facility]  Phone Note From Other Clinic   Summary of Call: Sherlyn Lick with [facility] called and stated that pt. refuses Physical Therapy Initial call taken by: Michaelle Copas,  February 01, 2009 12:10 PM  Follow-up for Phone Call        noted Follow-up by: D. Thomos Lemons DO,  February 01, 2009 1:20 PM

## 2010-02-28 NOTE — Assessment & Plan Note (Signed)
Summary: Pulmonary OV   Primary Provider/Referring Provider:  [name] DO  CC:  2 month follow up.  States breathing is better.  c/o nonprod cough x 2-3 days.  [name]  History of Present Illness: 77 yowf quit smoking 1990 due to bad cough, improved   02/01/2009-- Presents for post hosp follow up. Admitted 12/25-12/27/10 for COPD exacerbation. She has a hx of  degenerative disk disease on  chronic Dilaudid was brought in by her family for increased shortness of  breath.  She was diagnosed with a COPD exacerbation and right lower lobe   pneumonia. Concern for probably  episode of  aspiration.  She was tx w/ aggressive pulmonary hygiene and IV abx . Improved w/ transition over to oral/inhaler meds at discharge. She was last seen 2007 by Dr. Sherene Sires for her COPD managment. She has plans to establish with Dr. Artist Pais and would like to be seen at our [facility] office. Since discharge she is slowly improving, continues to get out of breath quite easily. She is currently on Symbicort 160 and Spiriva. Denies chest pain,  orthopnea, hemoptysis, fever, n/v/d, edema, headache.  February 22, 2009 10:41 AM Not as good as the 02/01/09.  Pt notes lips turning blue.  Pt is more dyspneic.  More cough but is dry.  Not on oxygen at home.  No chest pain.  Now on neb plus symbicort plus spiriva   March 01, 2009  At last ov on 1/27 we rx: Start oxygen 2Liter rest, 3Liter exertion obtain today from [facility] main office Start Bystolic 10mg  (2 - 5mg  tab or 1 - 10mg  tab) daily Stop Metoprolol Stop duoneb Stay on Symbicort and Spiriva daily Prednisone 10mg  4 each am x3days, 3 x 3days, 2 x 3days, 1 x 3days then stop Still dyspneic with any exertion but is better compared to 1/27 ov.     March 15, 2009 --Presents for follow up to discuss reclast. She has hx of osteoporosis .w/ hx of steroid exposure d/t  COPD flares. She has been intolerant to oral boniva and fosamax. She would like to discuss reclast. She is here  today to set up infusion. she has no hx of fx.  She is currently on caltrate two times a day.  rec no change in resp rx  April 12, 2009 Acute visit.  Pt c/o swelling in face and both ankles x 1 wk. She also c/o pressure in the center of chest that "comes and goes"- better with tums, no classic lateralizing or exertional cp.  She also c/o increased cough for several days- mostly dry and in the am. No pain in supine position.  Pt denies any significant sore throat, dysphagia, itching, sneezing,  nasal congestion or excess secretions,  fever, chills, sweats, unintended wt loss, pleuritic or exertional cp, hempoptysis, change in activity tolerance  orthopnea pnd.  May 10, 2009 12:08 PM Had swelling with reclast.  This was the first injection.  Red and swollen in legs.  Still with edema now in face. Now not much cough.  No real congestion.   No chest pain.    Preventive Screening-Counseling & Management  Alcohol-Tobacco     Smoking Status: quit > 6 months  Current Medications (verified): 1)  Simvastatin 20 Mg Tabs (Simvastatin) .... Take 1 Tab By Mouth At Bedtime 2)  Bystolic 5 Mg Tabs (Nebivolol Hcl) .... One By Mouth Once Daily 3)  Prilosec 20 Mg Cpdr (Omeprazole) .... Take One 30-60 Min Before First and  Last Meals of The Day 4)  Flexeril 10 Mg Tabs (Cyclobenzaprine Hcl) .... Take 1 Tablet By Mouth Two Times A Day 5)  Dilaudid 4 Mg Tabs (Hydromorphone Hcl) .... Take 1 Tablet By Mouth Two Times A Day 6)  Bayer Low Strength 81 Mg Tbec (Aspirin) .... Take 1 Tablet By Mouth Once A Day 7)  Spiriva Handihaler 18 Mcg Caps (Tiotropium Bromide Monohydrate) .... Inhale Contents of 1 Capsule Once A Day 8)  Symbicort 160-4.5 Mcg/act Aero (Budesonide-Formoterol Fumarate) .... Inhale 2 Puffs Two Times A Day 9)  Caltrate 600+d Plus 600-400 Mg-Unit Tabs (Calcium Carbonate-Vit D-Min) .... One By Mouth Two Times A Day With Meals 10)  Dilaudid Pump .... Continuous 11)  Aerochamber Mv  Misc (Spacer/aero-Holding  Chambers) .... Use With Symbicort 12)  Vitamin D3 1000 Unit Caps (Cholecalciferol) .... One By Mouth Once Daily 13)  Ventolin Hfa 108 (90 Base) Mcg/act Aers (Albuterol Sulfate) .... 2 Puffs By Mouth Once Daily As Needed  Allergies (verified): 1)  ! Codeine 2)  ! Morphine 3)  ! * Reclast 4)  ! * Contrast Dye  Past History:  Past medical, surgical, family and social histories (including risk factors) reviewed, and no changes noted (except as noted below).  Past Medical History: Reviewed history from 04/16/2009 and no changes required. OSTEOPOROSIS -  Reclast rx 03/07/09 OSTEOARTHRITIS (ICD-715.90) HIATAL HERNIA (ICD-553.3) GERD (ICD-530.81) PNEUMONIA (ICD-486) COPD (ICD-496)  HYPOTENSION (ICD-458.9)  PEPTIC ULCER DISEASE (ICD-533.90) CARDIAC ARRHYTHMIA (ICD-427.9) HYPERCHOLESTEROLEMIA (ICD-272.0) HEART FAILURE (ICD-428.9) HYPERTENSION (ICD-401.9) Hx of Spondylolysis with intractable back pain    Past Surgical History: Reviewed history from 04/16/2009 and no changes required. gallbladder - 2008  hysterectomy - 1970s back surgeries - 1970s, 1995, 2007 neck surgery - 1970s spinal cord stimulator    Past Pulmonary History:  Pulmonary History: Pulmonary Function Test  Date: 02/08/2009 Height (in.): 60 Gender: Female  Pre-Spirometry  FVC     Value: 1.48 L/min   Pred: 2.10 L/min     % Pred: 70 % FEV1     Value: 1.03 L     Pred: 1.38 L     % Pred: 74 % FEV1/FVC   Value: 69 %     Pred: 70 %     FEF 25-75   Value: 0.64 L/min   Pred: 1.72 L/min     % Pred: 37 %  Post-Spirometry  FVC     Value: 1.69 L/min   Pred: 2.10 L/min     % Pred: 80 % FEV1     Value: 1.25 L     Pred: 1.38 L     % Pred: 90 % FEV1/FVC   Value: 74 %     Pred: 70 %     FEF 25-75   Value: 0.89 L/min   Pred: 1.72 L/min     % Pred: 52 %  Lung Volumes  TLC     Value: 3.77 L   % Pred: 96 % RV     Value: 2.16 L   % Pred: 126 % DLCO     Value: 10.6 %   % Pred: 71 % DLCO/VA   Value: 3.31 %   % Pred: 101  %  Comments:  Moderate peripheral airflow obstruction ,  normal lung volumes,  mildly reduced DLCO  Family History: Reviewed history from 04/16/2009 and no changes required. heart disease - father cancer - mother [name] Family History Hypertension     Social History: Reviewed history from 04/12/2009 and no changes required.  former smoker, quit in 1990, x51yrs, 1ppd. no alcohol widow  3 children One grandchild at care link = Angelia lives with great-granddaughter  retired: worked for [employer]  Review of Systems       The patient complains of shortness of breath with activity, acid heartburn, and hand/feet swelling.  The patient denies shortness of breath at rest, productive cough, non-productive cough, coughing up blood, chest pain, irregular heartbeats, indigestion, loss of appetite, weight change, abdominal pain, difficulty swallowing, sore throat, tooth/dental problems, headaches, nasal congestion/difficulty breathing through nose, sneezing, itching, ear ache, anxiety, depression, joint stiffness or pain, rash, change in color of mucus, and fever.    Vital Signs:  Patient profile:   77 year old female Height:      62 inches Weight:      134 pounds BMI:     24.60 O2 Sat:      97 % on 2 L/minpulsed Temp:     98.5 degrees F oral Pulse rate:   85 / minute BP sitting:   120 / 76  (left arm) Cuff size:   regular  Vitals Entered By: Gweneth Dimitri RN (May 10, 2009 12:01 PM)  O2 Flow:  2 L/minpulsed CC: 2 month follow up.  States breathing is better.  c/o nonprod cough x 2-3 days.   Comments Medications reviewed with patient Daytime contact number verified with patient. Gweneth Dimitri RN  May 10, 2009 12:03 PM  .Ambulatory Pulse Oximetry  Resting; HR_84____    02 Sat__95% RA___  Lap1 (185 feet)   HR_101____   02 Sat__91% RA___ Lap2 (185 feet)   HR_105____   02 Sat__91% RA___    Lap3 (185 feet)   HR_____   02 Sat_____  ___Test Completed without  Difficulty _x__Test Stopped due to:     Pt requesting to rest after 2nd lap.  Pt did desat to 89% RA x 2 but only for a few seconds each time.   Gweneth Dimitri RN  May 10, 2009 12:32 PM     Physical Exam  Additional Exam:  Gen: Pleasant, well-nourished, in no distress , normal affect ENT: no lesions, no post nasal drip Neck: No JVD, no TMG, no carotid bruits Lungs: No use of accessory muscles, no dullness to percussion, inspir /expir wheezes, distant bs  Cardiovascular: RRR, heart sounds normal, no murmurs or gallop, trace pretibial edema bilaterally Abdomen: soft and non-tender, no HSM, BS normal Musculoskeletal: No deformities, no cyanosis or clubbing Neuro: alert, non-focal     CXR  Procedure date:  04/12/2009  Findings:      IMPRESSION: Stable chest x-ray with hyperaeration.  No active lung disease.  Impression & Recommendations:  Problem # 1:  NONSPECIFIC ABNORMAL TOXICOLOGICAL FINDINGS (ICD-796.0) Assessment Unchanged Probable adverse drug reaction to Reclast with edema plan d/c further Reclast   Problem # 2:  COR PULMONALE (ICD-416.9) Assessment: Improved Severe copd with corpulmonale actually better on BD therapy, oxygenation is better plan obtain a rollator for the pt pulse ox check at home wean oxygen to off during the day No change in inhaled medications.     Orders: Pulse Oximetry, Ambulatory (04540) DME Referral (DME) Est. Patient Level IV (98119)  Medications Added to Medication List This Visit: 1)  Ventolin Hfa 108 (90 Base) Mcg/act Aers (Albuterol sulfate) .... 2 puffs by mouth once daily as needed 2)  Pulse Oximeter  .... Use as needed  Complete Medication List: 1)  Simvastatin 20 Mg Tabs (Simvastatin) .... Take 1 tab by  mouth at bedtime 2)  Bystolic 5 Mg Tabs (Nebivolol hcl) .... One by mouth once daily 3)  Prilosec 20 Mg Cpdr (Omeprazole) .... Take one 30-60 min before first and last meals of the day 4)  Flexeril 10 Mg Tabs  (Cyclobenzaprine hcl) .... Take 1 tablet by mouth two times a day 5)  Dilaudid 4 Mg Tabs (Hydromorphone hcl) .... Take 1 tablet by mouth two times a day 6)  Bayer Low Strength 81 Mg Tbec (Aspirin) .... Take 1 tablet by mouth once a day 7)  Spiriva Handihaler 18 Mcg Caps (Tiotropium bromide monohydrate) .... Inhale contents of 1 capsule once a day 8)  Symbicort 160-4.5 Mcg/act Aero (Budesonide-formoterol fumarate) .... Inhale 2 puffs two times a day 9)  Caltrate 600+d Plus 600-400 Mg-unit Tabs (Calcium carbonate-vit d-min) .... One by mouth two times a day with meals 10)  Dilaudid Pump  .... Continuous 11)  Aerochamber Mv Misc (Spacer/aero-holding chambers) .... Use with symbicort 12)  Vitamin D3 1000 Unit Caps (Cholecalciferol) .... One by mouth once daily 13)  Ventolin Hfa 108 (90 Base) Mcg/act Aers (Albuterol sulfate) .... 2 puffs by mouth once daily as needed 14)  Pulse Oximeter  .... Use as needed  Patient Instructions: 1)  I would not give Reclast again 2)  A rollator will be obtained 3)  A pulse oximeter will be obtained 4)  No change in inhaled medications 5)  You can use oxygen during the day as needed especially to keep saturations above 89% (use pulse oximeter) 6)  Wear oxygen 2Liters every night when you sleep 7)  Return [place] Office 4 months Prescriptions: PULSE OXIMETER use as needed  #1 x 0   Entered and Authorized by:   Storm Frisk MD   Signed by:   Storm Frisk MD on 05/10/2009   Method used:   Print then Give to Patient   RxID:   [id number]

## 2010-02-28 NOTE — Progress Notes (Signed)
Summary: SEE DRS IN H/P...Marland Kitchenwould like to see DR. Alva.   Phone Note From Other Clinic Call back at [phone]   [facility] Call For: [facility] Summary of Call: PT WOULD LIKE TO SEE DRS IN H/P Initial call taken by: Rickard Patience,  January 22, 2009 11:28 AM  Follow-up for Phone Call        pt would like to start seeing one of the docs that sees patient in [place]. Please advise if thsi is okay with you. Carron Curie CMA  January 22, 2009 11:47 AM fine with me -  would ask dr Vassie Loll to see there Follow-up by: Nyoka Cowden MD,  January 22, 2009 12:52 PM  Additional Follow-up for Phone Call Additional follow up Details #1::        Dr. Vassie Loll is it okay for pt to switch to you? Carron Curie CMA  January 22, 2009 12:55 PM     Additional Follow-up for Phone Call Additional follow up Details #2::    ok. have already made appt with Tp next wk (post hospital dc - then with me as TP feels appropriate -pl. double chk) Follow-up by: Comer Locket. Vassie Loll MD,  January 22, 2009 9:45 PM  Additional Follow-up for Phone Call Additional follow up Details #3:: Details for Additional Follow-up Action Taken: verified in EMR that pt does indeed have appt with TP in [place] for 02-02-2008.  called and spoke with pt.  pt aware of appt date and time and that it is in [place].  Arman Filter LPN  January 23, 2009 8:51 AM

## 2010-02-28 NOTE — Progress Notes (Signed)
Summary: [facility] NURSE SAYS HEART RATE OVER 100N   Phone Note Other Incoming   Caller: [facility]  Summary of Call: SHE IS SEEING PATIENT AND HER HEART RATE ABOVE 100.  [phone].  THE FAMILY HAS SWITCHED HER MEDS.  SHE WAS ON ATENOLOL  12.5 MG two times a day DR Shad Ledvina CHANGED HER TO TOPROL 50 MG 1 TIME PER DAY.  THE FAMILY HAS STOPPED THE TOPROL ON SUNDAY AND THEY STARTED THE ATENOLOL BACK.  NURSE WANTS TO TALK TO DR Artist Pais  Initial call taken by: Roselle Locus,  February 21, 2009 2:30 PM

## 2010-02-28 NOTE — Assessment & Plan Note (Signed)
Summary: per dr yoo/mhf   Vital Signs:  Patient profile:   77 year old female Height:      62 inches Weight:      126.50 pounds BMI:     23.22 O2 Sat:      94 % on Room air Temp:     98.0 degrees F oral Pulse rate:   81 / minute Pulse rhythm:   regular Resp:     20 per minute BP sitting:   110 / 60  (right arm) Cuff size:   regular  Vitals Entered By: Glendell Docker CMA (July 26, 2009 2:09 PM)  O2 Flow:  Room air CC: Rm 3- Follow up , Depression Is Patient Diabetic? No Comments follow up on upper respiratory infection,denies productive cough, c/o hoarseness,headache and fatigue, she is requesting a rx for magic mouth wash for motuh irritation. would like to know if she needs to be on the oxygen more   Primary Care Provider:  D. Thomos Lemons DO  CC:  Rm 3- Follow up  and Depression.  History of Present Illness: 77 y/o white female for follow up pt seen by home care nurse - Marylene Land with [facility]  found patient has right and left lobe Rhonci,productive cough with thick yellow sputum pt restarted on abx  cough is somewhat better denies fever or chills  Allergies: 1)  ! Codeine 2)  ! Morphine 3)  ! * Reclast 4)  ! * Contrast Dye  Past History:  Past Medical History: OSTEOPOROSIS -  Reclast rx 03/07/09 OSTEOARTHRITIS (ICD-715.90)  HIATAL HERNIA (ICD-553.3) GERD (ICD-530.81)  PNEUMONIA (ICD-486) COPD (ICD-496)  HYPOTENSION (ICD-458.9)  PEPTIC ULCER DISEASE (ICD-533.90) CARDIAC ARRHYTHMIA (ICD-427.9) HYPERCHOLESTEROLEMIA (ICD-272.0) HEART FAILURE (ICD-428.9) HYPERTENSION (ICD-401.9) Hx of Spondylolysis with intractable back pain    Past Surgical History: gallbladder - 2008  hysterectomy - 1970s back surgeries - 1970s, 1995, 2007 neck surgery - 1970s  spinal cord stimulator     Family History: heart disease - father cancer - mother [name] Family History Hypertension      Social History: former smoker, quit in 1990, x67yrs, 1ppd. no alcohol widow      3 children One grandchild at care link = Angelia lives with great-granddaughter  retired: worked for [employer]  Physical Exam  General:  alert, well-developed, and well-nourished.   Lungs:  dry bibasilar crackles, normal respiratory effort and normal breath sounds.   Heart:  normal rate, regular rhythm, and no gallop.   Extremities:  trace left pedal edema and trace right pedal edema.   Neurologic:  cranial nerves II-XII intact and gait normal.     Impression & Recommendations:  Problem # 1:  COPD (ICD-496) Assessment Improved CXR neg for pna.  DC clinda.  finish course of levaquin Patient advised to call office if symptoms persist or worsen.  Her updated medication list for this problem includes:    Spiriva Handihaler 18 Mcg Caps (Tiotropium bromide monohydrate) ..... Inhale contents of 1 capsule once a day    Symbicort 160-4.5 Mcg/act Aero (Budesonide-formoterol fumarate) ..... Inhale 2 puffs two times a day    Ventolin Hfa 108 (90 Base) Mcg/act Aers (Albuterol sulfate) .[name]... 2 puffs by mouth once daily as needed  Orders: T-2 View CXR, Same Day (71020.5TC)  Complete Medication List: 1)  Bystolic 5 Mg Tabs (Nebivolol hcl) .... One half by mouth once daily 2)  Omeprazole 40 Mg Cpdr (Omeprazole) .... One by mouth two times a day 30 min before meals 3)  Flexeril 10 Mg Tabs (Cyclobenzaprine hcl) .... Take 1 tablet by mouth two times a day 4)  Dilaudid 4 Mg Tabs (Hydromorphone hcl) .... Take 1 tablet by mouth two times a day 5)  Bayer Low Strength 81 Mg Tbec (Aspirin) .... Take 1 tablet by mouth once a day 6)  Spiriva Handihaler 18 Mcg Caps (Tiotropium bromide monohydrate) .... Inhale contents of 1 capsule once a day 7)  Symbicort 160-4.5 Mcg/act Aero (Budesonide-formoterol fumarate) .... Inhale 2 puffs two times a day 8)  Caltrate 600+d Plus 600-400 Mg-unit Tabs (Calcium carbonate-vit d-min) .... One by mouth two times a day with meals 9)  Dilaudid Pump  .... Continuous 10)   Aerochamber Mv Misc (Spacer/aero-holding chambers) .... Use with symbicort 11)  Vitamin D3 50000 Units  .... One by mouth once weekly 12)  Ventolin Hfa 108 (90 Base) Mcg/act Aers (Albuterol sulfate) .... 2 puffs by mouth once daily as needed 13)  Pulse Oximeter  .... Use as needed 14)  Levaquin 500 Mg Tabs (Levofloxacin) .... One by mouth once daily 15)  Majic Mouthwash  .... Swish and swollow 1-2 teaspoonfuls every 4 hours as directed   Patient Instructions: 1)  You can stop clindamycin 2)  Finish levaquin prescription as directed  Current Allergies (reviewed today): ! CODEINE ! MORPHINE ! * RECLAST ! * CONTRAST DYE

## 2010-02-28 NOTE — Progress Notes (Signed)
Summary: Reclast Blood Work  Programme researcher, broadcasting/film/video of Call: call pt - pt needs to come in for BMET, Mg, Phos blood test before we can set up ReClast infusion.   she should have other labs drawn at same time -  Hepatic Panel prior to visit, ICD-9: 272.4 4)  Lipid Panel prior to visit, ICD-9: 272.4 5)  TSH prior to visit, ICD-9: 272.4 6)  CBC w/ Diff prior to visit, ICD-9: 496.0 7)  Vitamin D level:  733.00 Initial call taken by: D. Thomos Lemons DO,  February 16, 2009 6:00 PM  Follow-up for Phone Call        attempted to contact patient at [phone] line busy x3 Follow-up by: Glendell Docker CMA,  February 19, 2009 4:26 PM  Additional Follow-up for Phone Call Additional follow up Details #1::        patient advised per Dr Artist Pais instructions. Patient states she has  appointment with Dr Delford Field on 1/27, she was advised to come if fasting prior to appointment for Dr Delford Field to have labs drawn. Patient verbalized understanding and agrees Additional Follow-up by: Glendell Docker CMA,  February 20, 2009 4:11 PM

## 2010-02-28 NOTE — Assessment & Plan Note (Signed)
Summary: 2 day follow up/mhf   Vital Signs:  Patient profile:   77 year old female Weight:      126.50 pounds BMI:     23.22 O2 Sat:      93 % on Room air Temp:     98.1 degrees F oral Pulse rate:   72 / minute Pulse rhythm:   regular Resp:     18 per minute BP sitting:   116 / 70  (left arm) Cuff size:   regular  Vitals Entered By: [name] [name] (September 28, 2009 2:38 PM)  O2 Flow:  Room air CC: 2  day follow up  Is Patient Diabetic? No Comments c/o unresolved throat pain, requesting rx to help  numb her throat, she is still taking antibiotics   Primary Care Provider:  [name] [name]  CC:  2  day follow up .  History of Present Illness:  09/27/2010  (OV with [name] [name]) [name] [name] is an 77 year old female who presents with complaint of chest congestion- productive of yellow sputum.  Also has sore throat. Denies SOB, or fever. Notes + DOE- which is at baseline.  Patient uses 2 liters of oxygen at night due to history of cor pulmonale.    F/u o2 sat 90% on RA  July 19, 2009 11:58 AM This pt was admitted 6/10 -6/13 for RLL CAP and colitis of sigmoid.   Pt c/o persistent sore throat despite taking ceftin.  throat is red.  she denies obvious white film in AM.   she has chronic cough and headache.  slight left facial redness with "puffiness" no purulent nasal drainage  2D Echo from 6/11  Study Conclusions    - Left ventricle: The cavity size was normal. Wall thickness was     normal. Systolic function was normal. The estimated ejection     fraction was in the range of 60% to 65%. Although no diagnostic     regional wall motion abnormality was identified, this possibility     cannot be completely excluded on the basis of this study.    Preventive Screening-Counseling & Management  Alcohol-Tobacco     Smoking Status: quit  Allergies: 1)  ! Codeine 2)  ! Morphine 3)  ! * Reclast 4)  ! * Contrast Dye  Past History:  Past Medical  History: OSTEOPOROSIS -  Reclast rx 03/07/09 OSTEOARTHRITIS (ICD-715.90)  HIATAL HERNIA (ICD-553.3) GERD (ICD-530.81)   PNEUMONIA (ICD-486) COPD (ICD-496)  HYPOTENSION (ICD-458.9)  PEPTIC ULCER DISEASE (ICD-533.90) CARDIAC ARRHYTHMIA (ICD-427.9) HYPERCHOLESTEROLEMIA (ICD-272.0)  HEART FAILURE (ICD-428.9) HYPERTENSION (ICD-401.9) Hx of Spondylolysis with intractable back pain    Past Surgical History: gallbladder - 2008  hysterectomy - 1970s back surgeries - 1970s, 1995, 2007  neck surgery - 1970s  spinal cord stimulator      Family History: heart disease - father cancer - mother [name] [name]) Family History Hypertension        Social History: former smoker, quit in 1990, x8yrs, 1ppd. no alcohol widow    3 children   One grandchild at care link = Angelia lives with great-granddaughter  retired: worked for [employer] of [employer] Smoking Status:  quit  Review of Systems  The patient denies fever.         headache  Physical Exam  General:  alert, well-developed, and well-nourished.   Head:  left facial edema and mild redness Ears:  R ear normal and L ear normal.   Mouth:  no exudates  and pharyngeal erythema.   Lungs:  normal respiratory effort and coarse breath sounds left base,  no wheezing Heart:  normal rate, regular rhythm, and no gallop.   Extremities:  No lower extremity edema  Neurologic:  cranial nerves II-XII intact and gait normal.   Psych:  normally interactive and good eye contact.     Impression & Recommendations:  Problem # 1:  COUGH (ICD-786.2) 77 y/o chronic cough.  pt tx'ed for RLL pna in June.  Pt completed levaquin then clinda.  rule out chronic sinusitis considering headache.   check CT of chest.  CT of sinuses - No evidence of acute sinusitis.  Incomplete opacification of the left mastoid air cells.  CT of chest:  1.  Bilateral thyroid nodules. 2.  Spinal cord stimulator noted in the lower thoracic canal. 3.  Patchy areas of peribronchial  thickening and peripheral branching nodularity (tree in bud appearance).  This is likely a mild inflammatory or infectious process.  MAC is a possibility. 4.  No focal airspace consolidation or pulmonary edema.  Reviewed CT of chest with Dr. Delford Field.  arrange f/u next week.   cough, chronic hypoxia seems to be out of proportion to COPD  Orders: CT without Contrast (CT w/o contrast)  Problem # 2:  ACUTE PHARYNGITIS (ICD-462) no improvement with ceftin.  prev rapid strep was negative.  tx failure unlikely.  sore throat may be from oral candidida.  empiric clotrimazole troche.  use viocous lidocaine as needed for symptom relief.  Her updated medication list for this problem includes:    Bayer Low Strength 81 Mg Tbec (Aspirin) .[name]... Take 1 tablet by mouth once a day    Ceftin 500 Mg Tabs (Cefuroxime axetil) ..... One tablet by mouth two times a day x 7 days    Clotrimazole 10 Mg Troc (Clotrimazole) ..... Use 5 x per day x 2 weeks    Lidocaine Viscous 2 % Soln (Lidocaine hcl) .Marland KitchenMarland KitchenMarland KitchenMarland Kitchen 5 ml by mouth qid as needed for sore throat (gargle and spit)  Orders: CT without Contrast (CT w/o contrast)  Complete Medication List: 1)  Bystolic 5 Mg Tabs (Nebivolol hcl) .... One half by mouth once daily 2)  Omeprazole 40 Mg Cpdr (Omeprazole) .... One by mouth two times a day 30 min before meals 3)  Flexeril 10 Mg Tabs (Cyclobenzaprine hcl) .... Take 1 tablet by mouth two times a day 4)  Dilaudid 4 Mg Tabs (Hydromorphone hcl) .... Take 1 tablet by mouth two times a day 5)  Bayer Low Strength 81 Mg Tbec (Aspirin) .... Take 1 tablet by mouth once a day 6)  Spiriva Handihaler 18 Mcg Caps (Tiotropium bromide monohydrate) .... Inhale contents of 1 capsule once a day 7)  Symbicort 160-4.5 Mcg/act Aero (Budesonide-formoterol fumarate) .... Inhale 2 puffs two times a day 8)  Caltrate 600+d Plus 600-400 Mg-unit Tabs (Calcium carbonate-vit d-min) .... One by mouth two times a day with meals 9)  Dilaudid Pump  ....  Continuous 10)  Aerochamber Mv Misc (Spacer/aero-holding chambers) .... Use with symbicort 11)  Vitamin D3 50000 Units  .... One by mouth once weekly 12)  Ventolin Hfa 108 (90 Base) Mcg/act Aers (Albuterol sulfate) .... 2 puffs by mouth once daily as needed 13)  Pulse Oximeter  .... Use as needed 14)  Majic Mouthwash  .... Swish and swollow 1-2 teaspoonfuls every 4 hours as directed 15)  Gabapentin 100 Mg Caps (Gabapentin) 16)  Gabapentin 100 Mg Caps (Gabapentin) .... One by mouth three  times a day 17)  Ceftin 500 Mg Tabs (Cefuroxime axetil) .... One tablet by mouth two times a day x 7 days 18)  Clotrimazole 10 Mg Troc (Clotrimazole) .... Use 5 x per day x 2 weeks 19)  Lidocaine Viscous 2 % Soln (Lidocaine hcl) .... 5 ml by mouth qid as needed for sore throat (gargle and spit)  Patient Instructions: 1)  Please schedule a follow-up appointment in 1 month. Prescriptions: LIDOCAINE VISCOUS 2 % SOLN (LIDOCAINE HCL) 5 ml by mouth qid as needed for sore throat (gargle and spit)  #60 ml x 0   Entered and Authorized by:   D. Thomos Lemons [name]   Signed by:   D. Thomos Lemons [name] on 09/28/2009   Method used:   Electronically to        [facility] (retail)       [phone]       [place], [place]  [id number]       Ph: [phone]       Fax: [phone]   RxID:   [id number] CLOTRIMAZOLE 10 MG TROC (CLOTRIMAZOLE) use 5 x per day x 2 weeks  #70 x 0   Entered and Authorized by:   D. Thomos Lemons [name]   Signed by:   D. Thomos Lemons [name] on 09/28/2009   Method used:   Electronically to        [facility] (retail)       [phone]       [place], [place]  [id number]       Ph: [phone]       Fax: [phone]   RxID:   [id number]   Current Allergies (reviewed today): ! CODEINE ! MORPHINE ! * RECLAST ! * CONTRAST DYE

## 2010-02-28 NOTE — Procedures (Signed)
Summary: Oximetry/Oxygen Qualifying Services  Oximetry/Oxygen Qualifying Services   Imported By: Lanelle Bal 11/08/2009 12:19:54  _____________________________________________________________________  External Attachment:    Type:   Image     Comment:   External Document

## 2010-02-28 NOTE — Assessment & Plan Note (Signed)
Summary: Pulmonary OV   Primary Provider/Referring Provider:  [name] DO  CC:  6 wk follow up.  increased difficulty breathing at night, congestion - worse at night, and nonprod cough x 2-3 wks.  Denies wheezing and chest tightness.[name]  History of Present Illness: 77 yowf  July 19, 2009 11:58 AM This pt was admitted 6/10 -6/13 for RLL CAP and colitis of sigmoid.  All c/s data was neg. Pt had AMS.  She responded to flagyl and levaquin. Last CXR showed no PNA and CT chest essentially neg. Echo normal.   The diarrhea is now resolved.  Pt notes now bowels are better,  no diarrhea,  no n/v  no abd pain,   Pt still with cough and tightness in chest , occ prod mucus yellow,  no real pndrip.  No chest pain. No edema in feet,  but did have edema before.  Still with chronic headache.  Head hurts across forehead and to the side of the head.  The pt remains on oxygen at bedtime along with symbicort and spiriva.  October 04, 2009 1:33 PM Pt notes since 6/11.  having sore throat  started two weeks ago.   recent CT chest.   no real fever.  The pt si  still with cough and is congested. The mucus is yellow and gray. There is  no real chest pain.  The pt  notes some headaches.   THe pt is not dyspneic.  There is  no real edema.  The Head aches on front and top The pt was ecent rx with  cleocin A recent CT  chest  noted with LL tree in bud pattern ?MAC . CT sinus was normal  November 01, 2009 2:23 PM Dyspnea is the same, not as bad.  Not able to raise the mucus.  No real chest pain.  No sore throat. No f/c/s.  Notes some headaches.  Mucus is yellow not a gray.  Took abx became white and foamy. at home pulse ox is 94% January 03, 2010 11:50 AM Pt in hosp 11/6 - 12/07/09 for pancreatitis since d/c no cough,  no pain now.  occ pain in LUQ  but is better.   Notes more congestion and more dyspneic at night.  Occ heartburn with this .  Sleeps on two pillows. Bed is raised 6 inches. Has chronic headaches  and to see neurology (2/12)   Preventive Screening-Counseling & Management  Alcohol-Tobacco     Smoking Status: quit  Current Medications (verified): 1)  Bystolic 5 Mg Tabs (Nebivolol Hcl) .... One Half By Mouth Once Daily 2)  Omeprazole 40 Mg Cpdr (Omeprazole) .... One By Mouth Two Times A Day 30 Min Before Meals 3)  Dilaudid 4 Mg Tabs (Hydromorphone Hcl) .... Take 1 Tablet By Mouth Once Daily 4)  Bayer Low Strength 81 Mg Tbec (Aspirin) .... Take 1 Tablet By Mouth Once A Day 5)  Spiriva Handihaler 18 Mcg Caps (Tiotropium Bromide Monohydrate) .... Inhale Contents of 1 Capsule Once A Day 6)  Symbicort 160-4.5 Mcg/act Aero (Budesonide-Formoterol Fumarate) .... Inhale 2 Puffs Two Times A Day 7)  Caltrate 600+d Plus 600-400 Mg-Unit Tabs (Calcium Carbonate-Vit D-Min) .... One By Mouth Two Times A Day With Meals 8)  Dilaudid Pump .... Continuous 9)  Aerochamber Mv  Misc (Spacer/aero-Holding Chambers) .... Use With Symbicort 10)  Ventolin Hfa 108 (90 Base) Mcg/act Aers (Albuterol Sulfate) .[name].. 1 Puff By Mouth Three Times A Day As Needed 11)  Gabapentin 100 Mg Caps (Gabapentin) .... One By Mouth Three Times A Day 12)  Oxygen .... 2l At Night 13)  Flutter  Devi (Respiratory Therapy Supplies) .... Use 4 Times Daily 14)  Vitamin D (Ergocalciferol) 50000 Unit Caps (Ergocalciferol) .... One Tablet By Mouth Once Weekly 15)  Tandem 162-115.2 Mg Caps (Ferrous Fum-Iron Polysacch) .... Take 1 Capsule By Mouth Once A Day 16)  Fioricet 50-325-40 Mg Tabs (Butalbital-Apap-Caffeine) .... One Tablet By Mouth  Every 8 Hours As Needed Headache 17)  Vitamin C 500 Mg Tabs (Ascorbic Acid) .... Take 1 Tablet By Mouth Once A Day  Allergies (verified): 1)  ! Codeine 2)  ! Morphine 3)  ! * Reclast 4)  ! * Contrast Dye  Past History:  Past medical, surgical, family and social histories (including risk factors) reviewed, and no changes noted (except as noted below).  Past Medical History: Reviewed history from  09/28/2009 and no changes required. OSTEOPOROSIS -  Reclast rx 03/07/09 OSTEOARTHRITIS (ICD-715.90)  HIATAL HERNIA (ICD-553.3) GERD (ICD-530.81)   PNEUMONIA (ICD-486) COPD (ICD-496)  HYPOTENSION (ICD-458.9)  PEPTIC ULCER DISEASE (ICD-533.90) CARDIAC ARRHYTHMIA (ICD-427.9) HYPERCHOLESTEROLEMIA (ICD-272.0)  HEART FAILURE (ICD-428.9) HYPERTENSION (ICD-401.9) Hx of Spondylolysis with intractable back pain    Past Surgical History: Reviewed history from 09/28/2009 and no changes required. gallbladder - 2008  hysterectomy - 1970s back surgeries - 1970s, 1995, 2007  neck surgery - 1970s  spinal cord stimulator      Past Pulmonary History:  Pulmonary History: Pulmonary Function Test  Date: 02/08/2009 Height (in.): 60 Gender: Female  Pre-Spirometry  FVC     Value: 1.48 L/min   Pred: 2.10 L/min     % Pred: 70 % FEV1     Value: 1.03 L     Pred: 1.38 L     % Pred: 74 % FEV1/FVC   Value: 69 %     Pred: 70 %     FEF 25-75   Value: 0.64 L/min   Pred: 1.72 L/min     % Pred: 37 %  Post-Spirometry  FVC     Value: 1.69 L/min   Pred: 2.10 L/min     % Pred: 80 % FEV1     Value: 1.25 L     Pred: 1.38 L     % Pred: 90 % FEV1/FVC   Value: 74 %     Pred: 70 %     FEF 25-75   Value: 0.89 L/min   Pred: 1.72 L/min     % Pred: 52 %  Lung Volumes  TLC     Value: 3.77 L   % Pred: 96 % RV     Value: 2.16 L   % Pred: 126 % DLCO     Value: 10.6 %   % Pred: 71 % DLCO/VA   Value: 3.31 %   % Pred: 101 %  Comments:  Moderate peripheral airflow obstruction ,  normal lung volumes,  mildly reduced DLCO  Family History: Reviewed history from 09/28/2009 and no changes required. heart disease - father cancer - mother [name] Family History Hypertension        Social History: Reviewed history from 09/28/2009 and no changes required. former smoker, quit in 1990, x70yrs, 1ppd. no alcohol widow    3 children   One grandchild at care link = Angelia lives with great-granddaughter  retired: worked  for [employer]  Review of Systems       The patient complains of shortness of breath with  activity, productive cough, non-productive cough, and headaches.  The patient denies shortness of breath at rest, coughing up blood, chest pain, irregular heartbeats, acid heartburn, indigestion, loss of appetite, weight change, abdominal pain, difficulty swallowing, sore throat, tooth/dental problems, nasal congestion/difficulty breathing through nose, sneezing, itching, ear ache, anxiety, depression, hand/feet swelling, joint stiffness or pain, rash, change in color of mucus, and fever.    Vital Signs:  Patient profile:   77 year old female Height:      26 inches Weight:      125 pounds BMI:     130.48 O2 Sat:      95 % on Room air Temp:     98.0 degrees F oral Pulse rate:   75 / minute BP sitting:   130 / 70  (left arm) Cuff size:   regular  Vitals Entered By: Gweneth Dimitri RN (January 03, 2010 11:40 AM)  O2 Flow:  Room air CC: 6 wk follow up.  increased difficulty breathing at night, congestion - worse at night, nonprod cough x 2-3 wks.  Denies wheezing and chest tightness. Comments Medications reviewed with patient Daytime contact number verified with patient. Gweneth Dimitri RN  January 03, 2010 11:41 AM    Physical Exam  Additional Exam:  Gen: Pleasant, well-nourished, in no distress , normal affect ENT: no lesions, no post nasal drip Neck: No JVD, no TMG, no carotid bruits Lungs: No use of accessory muscles, no dullness to percussion,  distant BS, decreased  exp wheeze RLL and LLL Cardiovascular: RRR, heart sounds normal, no murmurs or gallop, trace pretibial edema bilaterally Abdomen: soft and non-tender, no HSM, BS normal Musculoskeletal: No deformities, no cyanosis or clubbing Neuro: alert, non-focal  Still poor HFA technique despite aerochamber use    Impression & Recommendations:  Problem # 1:  COPD (ICD-496) Assessment Unchanged  copd with recent tree in bud pattern  LLL on Ct chest ? mac infection.  also poss chronic aspiration .  pt with poor  hfa technique plan  cont current meds, may yet need to change HFA to neb med 120mg  depomedrol IM  Complete Medication List: 1)  Bystolic 5 Mg Tabs (Nebivolol hcl) .... One half by mouth once daily 2)  Omeprazole 40 Mg Cpdr (Omeprazole) .... One by mouth two times a day 30 min before meals 3)  Dilaudid 4 Mg Tabs (Hydromorphone hcl) .... Take 1 tablet by mouth once daily 4)  Bayer Low Strength 81 Mg Tbec (Aspirin) .... Take 1 tablet by mouth once a day 5)  Spiriva Handihaler 18 Mcg Caps (Tiotropium bromide monohydrate) .... Inhale contents of 1 capsule once a day 6)  Symbicort 160-4.5 Mcg/act Aero (Budesonide-formoterol fumarate) .... Inhale 2 puffs two times a day 7)  Caltrate 600+d Plus 600-400 Mg-unit Tabs (Calcium carbonate-vit d-min) .... One by mouth two times a day with meals 8)  Dilaudid Pump  .... Continuous 9)  Aerochamber Mv Misc (Spacer/aero-holding chambers) .... Use with symbicort 10)  Ventolin Hfa 108 (90 Base) Mcg/act Aers (Albuterol sulfate) .[name].. 1 puff by mouth three times a day as needed 11)  Gabapentin 100 Mg Caps (Gabapentin) .... One by mouth three times a day 12)  Oxygen  .... 2l at night 13)  Flutter Devi (Respiratory therapy supplies) .... Use 4 times daily 14)  Vitamin D (ergocalciferol) 50000 Unit Caps (Ergocalciferol) .... One tablet by mouth once weekly 15)  Tandem 162-115.2 Mg Caps (Ferrous fum-iron polysacch) .... Take 1 capsule by mouth once a day  16)  Fioricet 50-325-40 Mg Tabs (Butalbital-apap-caffeine) .... One tablet by mouth  every 8 hours as needed headache 17)  Vitamin C 500 Mg Tabs (Ascorbic acid) .... Take 1 tablet by mouth once a day  Other Orders: Est. Patient Level IV (16109) Depo- Medrol 80mg  (J1040) Depo-Medrol 20mg  (J1020) Admin of Therapeutic Inj  intramuscular or subcutaneous (60454)  Patient Instructions: 1)  Work on your inhaler technique . 2)  A 120mg   Depomedrol injection will be given 3)  No other medication changes 4)  Return 2 months [place]      Medication Administration  Injection # 1:    Medication: Depo- Medrol 80mg     Diagnosis: BRONCHITIS (ICD-490)    Route: IM    Site: RUOQ gluteus    Exp Date: 11/28/2011    Lot #: [id number]    Mfr: Pharm[employer]    Patient tolerated injection without complications    Given by: Kandice Hams CMA (January 03, 2010 12:11 PM)  Injection # 2:    Medication: Depo-Medrol 20mg     Diagnosis: BRONCHITIS (ICD-490)    Route: IM    Site: RUOQ gluteus    Exp Date: 11/28/2011    Lot #: [id number]    Mfr: [employer]    Patient tolerated injection without complications    Given by: Kandice Hams CMA (January 03, 2010 12:12 PM)  Orders Added: 1)  Est. Patient Level IV [09811] 2)  Depo- Medrol 80mg  [J1040] 3)  Depo-Medrol 20mg  [J1020] 4)  Admin of Therapeutic Inj  intramuscular or subcutaneous [91478]

## 2010-02-28 NOTE — Assessment & Plan Note (Signed)
Summary: Pulmonary OV   Primary Provider/Referring Provider:  [name] DO  CC:  HFU.  Pt states she is having more difficulty breathing when walking since discharge. Also c/o of feeling like she has something "stuck" in her throat.  Occas coughing - off white mucus.  .  History of Present Illness: 77 yowf  July 19, 2009 11:58 AM This pt was admitted 6/10 -6/13 for RLL CAP and colitis of sigmoid.  All c/s data was neg. Pt had AMS.  She responded to flagyl and levaquin. Last CXR showed no PNA and CT chest essentially neg. Echo normal.   The diarrhea is now resolved.  Pt notes now bowels are better,  no diarrhea,  no n/v  no abd pain,   Pt still with cough and tightness in chest , occ prod mucus yellow,  no real pndrip.  No chest pain. No edema in feet,  but did have edema before.  Still with chronic headache.  Head hurts across forehead and to the side of the head.  The pt remains on oxygen at bedtime along with symbicort and spiriva.  October 04, 2009 1:33 PM Pt notes since 6/11.  having sore throat  started two weeks ago.   recent CT chest.   no real fever.  The pt si  still with cough and is congested. The mucus is yellow and gray. There is  no real chest pain.  The pt  notes some headaches.   THe pt is not dyspneic.  There is  no real edema.  The Head aches on front and top The pt was ecent rx with  cleocin A recent CT  chest  noted with LL tree in bud pattern ?MAC . CT sinus was normal  November 01, 2009 2:23 PM Dyspnea is the same, not as bad.  Not able to raise the mucus.  No real chest pain.  No sore throat. No f/c/s.  Notes some headaches.  Mucus is yellow not a gray.  Took abx became white and foamy. at home pulse ox is 94% January 03, 2010 11:50 AM Pt in hosp 11/6 - 12/07/09 for pancreatitis since d/c no cough,  no pain now.  occ pain in LUQ  but is better.   Notes more congestion and more dyspneic at night.  Occ heartburn with this .  Sleeps on two pillows. Bed is raised  6 inches. Has chronic headaches and to see neurology (2/12)  February 07, 2010 2:28 PM dx diarrhea and abd pain.  dx colitis ?etiol pt admitted for same  d/c on hyosciamine for nausea and abd pain LFTs higher dyspnea is worse than before .  notes some cough but something in the throat mucus off white no real chest pain. has indigestion no further diarrhea still with abd pain    Current Medications (verified): 1)  Bystolic 5 Mg Tabs (Nebivolol Hcl) .... One Half By Mouth Once Daily 2)  Omeprazole 40 Mg Cpdr (Omeprazole) .... One By Mouth Two Times A Day 30 Min Before Meals 3)  Bayer Low Strength 81 Mg Tbec (Aspirin) .... Take 1 Tablet By Mouth Once A Day 4)  Spiriva Handihaler 18 Mcg Caps (Tiotropium Bromide Monohydrate) .... Inhale Contents of 1 Capsule Once A Day 5)  Symbicort 160-4.5 Mcg/act Aero (Budesonide-Formoterol Fumarate) .... Inhale 2 Puffs Two Times A Day 6)  Caltrate 600+d Plus 600-400 Mg-Unit Tabs (Calcium Carbonate-Vit D-Min) .... One By Mouth Two Times A Day With Meals 7)  Dilaudid Pump .... Continuous 8)  Aerochamber Mv  Misc (Spacer/aero-Holding Chambers) .... Use With Symbicort 9)  Ventolin Hfa 108 (90 Base) Mcg/act Aers (Albuterol Sulfate) .[name].. 1 Puff By Mouth Three Times A Day As Needed 10)  Gabapentin 100 Mg Caps (Gabapentin) .... One By Mouth Three Times A Day 11)  Oxygen .... 2l At Night 12)  Flutter  Devi (Respiratory Therapy Supplies) .... Use 4 Times Daily 13)  Vitamin D (Ergocalciferol) 50000 Unit Caps (Ergocalciferol) .... One Tablet By Mouth Once Weekly 14)  Tandem 162-115.2 Mg Caps (Ferrous Fum-Iron Polysacch) .... Take 1 Capsule By Mouth Once A Day 15)  Fioricet 50-325-40 Mg Tabs (Butalbital-Apap-Caffeine) .... One Tablet By Mouth  Every 8 Hours As Needed Headache 16)  Vitamin C 500 Mg Tabs (Ascorbic Acid) .... Take 1 Tablet By Mouth Once A Day 17)  Hyoscyamine Sulfate 0.125 Mg Tabs (Hyoscyamine Sulfate) .... Take 1 Tablet By Mouth Once A Day As  Needed  Allergies (verified): 1)  ! Codeine 2)  ! Morphine 3)  ! * Reclast 4)  ! * Contrast Dye  Past History:  Past medical, surgical, family and social histories (including risk factors) reviewed, and no changes noted (except as noted below).  Past Medical History: Reviewed history from 12/27/2009 and no changes required. OSTEOPOROSIS -  Reclast rx 03/07/09 OSTEOARTHRITIS (ICD-715.90)   HIATAL HERNIA (ICD-553.3) GERD (ICD-530.81)   PNEUMONIA (ICD-486) COPD (ICD-496)  HYPOTENSION (ICD-458.9)  PEPTIC ULCER DISEASE (ICD-533.90) CARDIAC ARRHYTHMIA (ICD-427.9) HYPERCHOLESTEROLEMIA (ICD-272.0)  HEART FAILURE (ICD-428.9) HYPERTENSION (ICD-401.9) Hx of Spondylolysis with intractable back pain    Past Surgical History: Reviewed history from 12/27/2009 and no changes required. gallbladder - 2008  hysterectomy - 1970s back surgeries - 1970s, 1995, 2007  neck surgery - 1970s  spinal cord stimulator      Past Pulmonary History:  Pulmonary History: Pulmonary Function Test  Date: 02/08/2009 Height (in.): 60 Gender: Female  Pre-Spirometry  FVC     Value: 1.48 L/min   Pred: 2.10 L/min     % Pred: 70 % FEV1     Value: 1.03 L     Pred: 1.38 L     % Pred: 74 % FEV1/FVC   Value: 69 %     Pred: 70 %     FEF 25-75   Value: 0.64 L/min   Pred: 1.72 L/min     % Pred: 37 %  Post-Spirometry  FVC     Value: 1.69 L/min   Pred: 2.10 L/min     % Pred: 80 % FEV1     Value: 1.25 L     Pred: 1.38 L     % Pred: 90 % FEV1/FVC   Value: 74 %     Pred: 70 %     FEF 25-75   Value: 0.89 L/min   Pred: 1.72 L/min     % Pred: 52 %  Lung Volumes  TLC     Value: 3.77 L   % Pred: 96 % RV     Value: 2.16 L   % Pred: 126 % DLCO     Value: 10.6 %   % Pred: 71 % DLCO/VA   Value: 3.31 %   % Pred: 101 %  Comments:  Moderate peripheral airflow obstruction ,  normal lung volumes,  mildly reduced DLCO  Family History: Reviewed history from 12/27/2009 and no changes required. heart disease - father cancer  - mother [name] [name]) Family History Hypertension  Social History: Reviewed history from 12/27/2009 and no changes required. former smoker, quit in 1990, x23yrs, 1ppd. no alcohol widow    3 children   One grandchild at care link = Angelia lives with great-granddaughter   retired: worked for [employer] of [employer]  Review of Systems       The patient complains of shortness of breath with activity, productive cough, non-productive cough, and abdominal pain.  The patient denies shortness of breath at rest, coughing up blood, chest pain, irregular heartbeats, acid heartburn, indigestion, loss of appetite, weight change, difficulty swallowing, sore throat, tooth/dental problems, headaches, nasal congestion/difficulty breathing through nose, sneezing, itching, ear ache, anxiety, depression, hand/feet swelling, joint stiffness or pain, rash, change in color of mucus, and fever.    Vital Signs:  Patient profile:   77 year old female Height:      61 inches Weight:      126.31 pounds BMI:     23.95 O2 Sat:      92 % on Room air Temp:     99.0 degrees F oral Pulse rate:   82 / minute BP sitting:   94 / 62  (right arm) Cuff size:   regular  Vitals Entered By: Gweneth Dimitri RN (February 07, 2010 2:10 PM)  O2 Flow:  Room air CC: HFU.  Pt states she is having more difficulty breathing when walking since discharge. Also c/o of feeling like she has something "stuck" in her throat.  Occas coughing - off white mucus.   Comments Medications reviewed with patient Daytime contact number verified with patient. Gweneth Dimitri RN  February 07, 2010 2:08 PM    Physical Exam  Additional Exam:  Gen: Pleasant, well-nourished, in no distress , normal affect ENT: no lesions, no post nasal drip Neck: No JVD, no TMG, no carotid bruits Lungs: No use of accessory muscles, no dullness to percussion,  distant BS, decreased  exp wheeze RLL and LLL Cardiovascular: RRR, heart sounds normal, no murmurs or gallop, trace  pretibial edema bilaterally Abdomen: diffusely tender, bsa, no masses or hsm Musculoskeletal: No deformities, no cyanosis or clubbing Neuro: alert, non-focal     WBC                       5.3 K/uL                    4.0-10.5   RBC                  [L]  3.74 MIL/uL                 3.87-5.11   Hemoglobin           [L]  11.5 g/dL                   16.1-09.6   Hematocrit           [L]  35.6 %                      36.0-46.0   MCV                       95.2 fL                     78.0-100.0 ! MCH  30.7 pg                     26.0-34.0   MCHC                      32.3 g/dL                   19.1-47.8   RDW                       14.9 %                      11.5-15.5   Platelet Count            237 K/uL                    150-400   Granulocyte %             69 %                        43-77   Absolute Gran             3.7 K/uL                    1.7-7.7   Lymph %                   20 %                        12-46   Absolute Lymph            1.1 K/uL                    0.7-4.0   Mono %                    9 %                         3-12   Absolute Mono             0.5 K/uL                    0.1-1.0   Eos %                     2 %                         0-5   Absolute Eos              0.1 K/uL                    0.0-0.7   Baso %                    0 %                         0-1   Absolute Baso             0.0 K/uL                    0.0-0.1   Smear Review       RESULT: Criteria for review not  met  Tests: (2) Basic Metabolic Panel (16109)   Sodium                    142 mEq/L                   135-145   Potassium                 4.2 mEq/L                   3.5-5.3   Chloride                  100 mEq/L                   96-112   CO2                       32 mEq/L                    19-32   Glucose                   78 mg/dL                    60-45   BUN                       14 mg/dL                    4-09   Creatinine                0.67 mg/dL                   0.40-1.20   Calcium                   9.0 mg/dL                   8.1-19.1  Tests: (3) Liver Profile (47829)   Bilirubin, Total          0.3 mg/dL                   5.6-2.1   Bilirubin, Direct         0.1 mg/dL                   3.0-8.6   Indirect Bilirubin        0.2 mg/dL                   5.7-8.4   Alkaline Phosphatase      94 U/L                      39-117   AST/SGOT                  19 U/L                      0-37   ALT/SGPT                  16 U/L                      0-35   Total Protein             6.4 g/dL  6.0-8.3   Albumin                   4.0 g/dL                    0.1-0.2  CXR  Procedure date:  02/07/2010  Findings:      IMPRESSION: COPD without acute disease.  Impression & Recommendations:  Problem # 1:  BRONCHITIS (ICD-490) Assessment Deteriorated acute tracheobronchits and ongoing mild colitis ?etiol plan Flagyl one three times daily for 5days to cover gut  Cipro one twice daily until gone to cover lungs/gut cxr shows nad  labs ok Her updated medication list for this problem includes:    Spiriva Handihaler 18 Mcg Caps (Tiotropium bromide monohydrate) ..... Inhale contents of 1 capsule once a day    Symbicort 160-4.5 Mcg/act Aero (Budesonide-formoterol fumarate) ..... Inhale 2 puffs two times a day    Ventolin Hfa 108 (90 Base) Mcg/act Aers (Albuterol sulfate) .[name]... 1 puff by mouth three times a day as needed    Cipro 500 Mg Tabs (Ciprofloxacin hcl) .[name]... Take one tablet po twice a day    Flagyl 500 Mg Tabs (Metronidazole) ..... One by mouth three times a day  Orders: Est. Patient Level V (72536) T-2 View CXR (71020TC)  Medications Added to Medication List This Visit: 1)  Cipro 500 Mg Tabs (Ciprofloxacin hcl) .... Take one tablet po twice a day 2)  Flagyl 500 Mg Tabs (Metronidazole) .... One by mouth three times a day  Complete Medication List: 1)  Bystolic 5 Mg Tabs (Nebivolol hcl) .... One half by mouth once daily 2)  Omeprazole  40 Mg Cpdr (Omeprazole) .... One by mouth two times a day 30 min before meals 3)  Bayer Low Strength 81 Mg Tbec (Aspirin) .... Take 1 tablet by mouth once a day 4)  Spiriva Handihaler 18 Mcg Caps (Tiotropium bromide monohydrate) .... Inhale contents of 1 capsule once a day 5)  Symbicort 160-4.5 Mcg/act Aero (Budesonide-formoterol fumarate) .... Inhale 2 puffs two times a day 6)  Caltrate 600+d Plus 600-400 Mg-unit Tabs (Calcium carbonate-vit d-min) .... One by mouth two times a day with meals 7)  Dilaudid Pump  .... Continuous 8)  Aerochamber Mv Misc (Spacer/aero-holding chambers) .... Use with symbicort 9)  Ventolin Hfa 108 (90 Base) Mcg/act Aers (Albuterol sulfate) .[name].. 1 puff by mouth three times a day as needed 10)  Gabapentin 100 Mg Caps (Gabapentin) .... One by mouth three times a day 11)  Oxygen  .... 2l at night 12)  Flutter Devi (Respiratory therapy supplies) .... Use 4 times daily 13)  Vitamin D (ergocalciferol) 50000 Unit Caps (Ergocalciferol) .... One tablet by mouth once weekly 14)  Tandem 162-115.2 Mg Caps (Ferrous fum-iron polysacch) .... Take 1 capsule by mouth once a day 15)  Fioricet 50-325-40 Mg Tabs (Butalbital-apap-caffeine) .... One tablet by mouth  every 8 hours as needed headache 16)  Vitamin C 500 Mg Tabs (Ascorbic acid) .... Take 1 tablet by mouth once a day 17)  Hyoscyamine Sulfate 0.125 Mg Tabs (Hyoscyamine sulfate) .... Take 1 tablet by mouth once a day as needed 18)  Cipro 500 Mg Tabs (Ciprofloxacin hcl) .... Take one tablet po twice a day 19)  Flagyl 500 Mg Tabs (Metronidazole) .... One by mouth three times a day  Other Orders: TLB-CBC Platelet - w/Differential (85025-CBCD) TLB-Hepatic/Liver Function Pnl (80076-HEPATIC) TLB-BMP (Basic Metabolic Panel-BMET) (80048-METABOL)  Patient Instructions: 1)  Flagyl one three times daily for 5days  2)  Cipro one twice daily until gone 3)  Labs and Chest xray today 4)  Return [place] two weeks Prescriptions: FLAGYL  500 MG TABS (METRONIDAZOLE) one by mouth three times a day  #15 x 0   Entered and Authorized by:   Storm Frisk MD   Signed by:   Storm Frisk MD on 02/07/2010   Method used:   Electronically to        [facility] (retail)       [phone]       [place], [place]  [id number]       Ph: [phone]       Fax: [phone]   RxID:   [id number] CIPRO 500 MG  TABS (CIPROFLOXACIN HCL) Take one tablet po twice a day  #14 x 0   Entered and Authorized by:   Storm Frisk MD   Signed by:   Storm Frisk MD on 02/07/2010   Method used:   Electronically to        [facility] (retail)       [phone] [address]       [place], [place]  [id number]       Ph: [phone]       Fax: [phone]   RxID:   [id number]

## 2010-02-28 NOTE — Miscellaneous (Signed)
Summary: Care Plan/[facility]  Care Plan/[facility]   Imported By: Lanelle Bal 08/02/2009 12:41:33  _____________________________________________________________________  External Attachment:    Type:   Image     Comment:   External Document

## 2010-02-28 NOTE — Assessment & Plan Note (Signed)
Summary: congestion & sore/dt--Rm 5   Vital Signs:  Patient profile:   77 year old female Height:      62 inches Weight:      128 pounds BMI:     23.50 O2 Sat:      88 % on Room air Temp:     98.3 degrees F oral Pulse rate:   86 / minute Pulse rhythm:   regular Resp:     16 per minute BP sitting:   118 / 70  (right arm) Cuff size:   regular  Vitals Entered By: Mervin Kung CMA Duncan Dull) (September 26, 2009 1:46 PM)  O2 Flow:  Room air CC: Room 5  Pt states she has sore throat since Sunday, now has productive cough. Comments Pt had Clindamycin rx from the past that she has started. Nicki Guadalajara Fergerson CMA Duncan Dull)  September 26, 2009 1:50 PM    Primary Care Provider:  D. Thomos Lemons DO  CC:  Room 5  Pt states she has sore throat since Sunday and now has productive cough..  History of Present Illness: Ms Weimer is an 77 year old female who presents with complaint of chest congestion- productive of yellow sputum.  Also has sore throat. Denies SOB, or fever. Notes + DOE- which is at baseline.  Patient uses 2 liters of oxygen at night due to history of cor pulmonale.    F/u o2 sat 90% on RA  Allergies: 1)  ! Codeine 2)  ! Morphine 3)  ! * Reclast 4)  ! * Contrast Dye  Past History:  Past Medical History: Last updated: 08/17/2009 OSTEOPOROSIS -  Reclast rx 03/07/09 OSTEOARTHRITIS (ICD-715.90)  HIATAL HERNIA (ICD-553.3) GERD (ICD-530.81)  PNEUMONIA (ICD-486) COPD (ICD-496)  HYPOTENSION (ICD-458.9)  PEPTIC ULCER DISEASE (ICD-533.90) CARDIAC ARRHYTHMIA (ICD-427.9) HYPERCHOLESTEROLEMIA (ICD-272.0)  HEART FAILURE (ICD-428.9) HYPERTENSION (ICD-401.9) Hx of Spondylolysis with intractable back pain    Past Surgical History: Last updated: 08/17/2009 gallbladder - 2008  hysterectomy - 1970s back surgeries - 1970s, 1995, 2007  neck surgery - 1970s  spinal cord stimulator     Review of Systems       see HPI  Physical Exam  General:  Elderly white female, awake, alert and in  NAD Head:  Normocephalic and atraumatic without obvious abnormalities. No apparent alopecia or balding. Lungs:  diminished breath sounds at the bases R>L.  Bibasilar crackles, no wheezing Heart:  Normal rate and regular rhythm. S1 and S2 normal without gallop, murmur, click, rub or other extra sounds. Extremities:  1+ left pedal edema and 1+ right pedal edema.     Impression & Recommendations:  Problem # 1:  BRONCHITIS (ICD-490)  Rapid strep is negative. Started clindamycin this AM which she had on hand.  Recommended that she start ceftin instead.  CXR is negative.  I am concerned about her hypoxia.  She was 90% on room air when I saw her in the office- which appears to be lower than her baseline.   Wears HS 02 2L [place].  No clear volume overload. Plan to continue symbicort, ventolin and spiriva.   + hx of cor pulmonale. Will plan to have pt return on friday so that she can be re-evaluated.    Her updated medication list for this problem includes:    Spiriva Handihaler 18 Mcg Caps (Tiotropium bromide monohydrate) ..... Inhale contents of 1 capsule once a day    Symbicort 160-4.5 Mcg/act Aero (Budesonide-formoterol fumarate) ..... Inhale 2 puffs two times a day  Ventolin Hfa 108 (90 Base) Mcg/act Aers (Albuterol sulfate) .[name]... 2 puffs by mouth once daily as needed    Ceftin 500 Mg Tabs (Cefuroxime axetil) ..... One tablet by mouth two times a day x 7 days  Orders: CXR- 2view (CXR) Prescription Created Electronically [id number])  Complete Medication List: 1)  Bystolic 5 Mg Tabs (Nebivolol hcl) .... One half by mouth once daily 2)  Omeprazole 40 Mg Cpdr (Omeprazole) .... One by mouth two times a day 30 min before meals 3)  Flexeril 10 Mg Tabs (Cyclobenzaprine hcl) .... Take 1 tablet by mouth two times a day 4)  Dilaudid 4 Mg Tabs (Hydromorphone hcl) .... Take 1 tablet by mouth two times a day 5)  Bayer Low Strength 81 Mg Tbec (Aspirin) .... Take 1 tablet by mouth once a day 6)  Spiriva  Handihaler 18 Mcg Caps (Tiotropium bromide monohydrate) .... Inhale contents of 1 capsule once a day 7)  Symbicort 160-4.5 Mcg/act Aero (Budesonide-formoterol fumarate) .... Inhale 2 puffs two times a day 8)  Caltrate 600+d Plus 600-400 Mg-unit Tabs (Calcium carbonate-vit d-min) .... One by mouth two times a day with meals 9)  Dilaudid Pump  .... Continuous 10)  Aerochamber Mv Misc (Spacer/aero-holding chambers) .... Use with symbicort 11)  Vitamin D3 50000 Units  .... One by mouth once weekly 12)  Ventolin Hfa 108 (90 Base) Mcg/act Aers (Albuterol sulfate) .... 2 puffs by mouth once daily as needed 13)  Pulse Oximeter  .... Use as needed 14)  Majic Mouthwash  .... Swish and swollow 1-2 teaspoonfuls every 4 hours as directed 15)  Gabapentin 100 Mg Caps (Gabapentin) 16)  Gabapentin 100 Mg Caps (Gabapentin) .... One by mouth three times a day 17)  Ceftin 500 Mg Tabs (Cefuroxime axetil) .... One tablet by mouth two times a day x 7 days  Other Orders: Rapid Strep (60454)  Patient Instructions: 1)  Please complete your chest x-ray downstairs 2)  Please follow up with Dr. Artist Pais on Friday. 3)  Call if you develop fever, or worsening shortness of breath. Prescriptions: SYMBICORT 160-4.5 MCG/ACT AERO (BUDESONIDE-FORMOTEROL FUMARATE) Inhale 2 puffs two times a day  #1 x 0   Entered and Authorized by:   Lemont Fillers FNP   Signed by:   Lemont Fillers FNP on 09/26/2009   Method used:   Samples Given   RxID:   [id number] SPIRIVA HANDIHALER 18 MCG CAPS (TIOTROPIUM BROMIDE MONOHYDRATE) Inhale contents of 1 capsule once a day  #1 x 0   Entered and Authorized by:   Lemont Fillers FNP   Signed by:   Lemont Fillers FNP on 09/26/2009   Method used:   Samples Given   RxID:   [id number] CEFTIN 500 MG TABS (CEFUROXIME AXETIL) one tablet by mouth two times a day x 7 days  #14 x 0   Entered and Authorized by:   Lemont Fillers FNP   Signed by:   Lemont Fillers  FNP on 09/26/2009   Method used:   Electronically to        [facility] (retail)       [phone]       [place], [place]  [id number]       Ph: [phone]       Fax: [phone]   RxID:   [id number]   Current Allergies (reviewed today): ! CODEINE ! MORPHINE ! * RECLAST ! * CONTRAST  DYE  Laboratory Results    Other Tests  Rapid Strep: negative  Kit Test Internal QC: Positive   (Normal Range: Negative)

## 2010-02-28 NOTE — Letter (Signed)
Summary: CMN for [facility]  CMN for [facility]   Imported By: Lanelle Bal 01/25/2010 12:58:48  _____________________________________________________________________  External Attachment:    Type:   Image     Comment:   External Document

## 2010-02-28 NOTE — Assessment & Plan Note (Signed)
Summary: hospital fu/dt   Vital Signs:  Patient profile:   77 year old female Weight:      125.50 pounds BMI:     23.04 O2 Sat:      95 % on Room air Temp:     98.2 degrees F oral Pulse rate:   86 / minute Pulse rhythm:   regular Resp:     22 per minute BP sitting:   94 / 50  (right arm) Cuff size:   regular  Vitals Entered By: Glendell Docker CMA (July 19, 2009 1:58 PM)  O2 Flow:  Room air CC: Rm 3- Hospital Follow up  Is Patient Diabetic? No   Primary Care Provider:  [name] DO  CC:  Rm 3- Hospital Follow up .  History of Present Illness: 77 yo white female for hospital fu pt admitted for: 1. Right lower lobe pneumonia.   2. Suspected mild wall thickening of the sigmoid colon suggesting       early colitis.   3. CT of the head without contrast.   4. Uncontrolled neck pain and headache with narcotic dependence.   5. History of chronic obstructive pulmonary disease.   6. History of aspiration pneumonia in January of 2011.   7. Osteoporosis.   8. Degenerative disk disease.   9. Hyperlipidemia.   This is an 77 year old female with a history of COPD,   osteoporosis, chronic back pain secondary to lumbar disk disease,   hyperlipidemia, congestive heart failure presents to [facility] ED   because of altered mental status.  The patient was found to be coughing   a lot with a productive phlegm, and she was having some sore throat over   the last few days.  The patient started having some nausea, vomiting,   diarrhea and had a fever of 102.5 in the ED.  The patient was brought in   because of altered mental status.  In the ED, the patient was found to   have a right lower lobe infiltrate and possible colitis and was   admitted.   she is feeling better overall.  still has headaches mental status returned to baseline   denies cough during meals  takes dilaudid at bedtime  Allergies: 1)  ! Codeine 2)  ! Morphine 3)  ! * Reclast 4)  ! * Contrast Dye  Past  History:  Past Medical History: OSTEOPOROSIS -  Reclast rx 03/07/09 OSTEOARTHRITIS (ICD-715.90) HIATAL HERNIA (ICD-553.3)  GERD (ICD-530.81)  PNEUMONIA (ICD-486) COPD (ICD-496)  HYPOTENSION (ICD-458.9)  PEPTIC ULCER DISEASE (ICD-533.90)  CARDIAC ARRHYTHMIA (ICD-427.9) HYPERCHOLESTEROLEMIA (ICD-272.0) HEART FAILURE (ICD-428.9) HYPERTENSION (ICD-401.9) Hx of Spondylolysis with intractable back pain    Past Surgical History: gallbladder - 2008  hysterectomy - 1970s back surgeries - 1970s, 1995, 2007 neck surgery - 1970s  spinal cord stimulator      Social History: former smoker, quit in 1990, x76yrs, 1ppd. no alcohol widow   3 children One grandchild at care link = Angelia  lives with great-granddaughter  retired: worked for [employer]  Review of Systems GI:  Denies abdominal pain, bloody stools, change in bowel habits, dark tarry stools, diarrhea, nausea, and vomiting.  Physical Exam  General:  alert, well-developed, and well-nourished.   Head:  normocephalic and atraumatic.   Mouth:  pharynx pink and moist.   Neck:  supple and no masses.   Lungs:  normal respiratory effort and normal breath sounds.   Heart:  normal rate, regular rhythm, and no  gallop.   Abdomen:  soft, non-tender, and normal bowel sounds.     Impression & Recommendations:  Problem # 1:  PNEUMONIA (ICD-486) Assessment Improved pt recently dc'ed from hosp after tx for pna.  she has clinically improved.  we discussed possibility of aspiration pna esp considering use of chronic narcotics.  raise head of bed.  use pain meds sparingly Her updated medication list for this problem includes:    Levaquin 500 Mg Tabs (Levofloxacin) ..... One by mouth once daily  Problem # 2:  HYPERTENSION (ICD-401.9) bp is low normal.  increase fluid inake.  if bp does not improve, pt to hold bystolic.  headache may be related to volume depletion Her updated medication list for this problem includes:    Bystolic 5 Mg  Tabs (Nebivolol hcl) ..... One half by mouth once daily  BP today: 94/50 Prior BP: 112/64 (07/19/2009)  Labs Reviewed: K+: 4.6 (04/09/2009) Creat: : 0.78 (04/09/2009)   Chol: 151 (04/09/2009)   HDL: 39 (04/09/2009)   LDL: 87 (04/09/2009)   TG: 125 (04/09/2009)  Problem # 3:  HYPERCHOLESTEROLEMIA (ICD-272.0) statin stopped due to elevated LFTs Labs Reviewed: SGOT: 20 (04/09/2009)   SGPT: 12 (04/09/2009)   HDL:39 (04/09/2009)  LDL:87 (04/09/2009)  Chol:151 (04/09/2009)  Trig:125 (04/09/2009)  Complete Medication List: 1)  Bystolic 5 Mg Tabs (Nebivolol hcl) .... One half by mouth once daily 2)  Omeprazole 40 Mg Cpdr (Omeprazole) .... One by mouth two times a day 30 min before meals 3)  Flexeril 10 Mg Tabs (Cyclobenzaprine hcl) .... Take 1 tablet by mouth two times a day 4)  Dilaudid 4 Mg Tabs (Hydromorphone hcl) .... Take 1 tablet by mouth two times a day 5)  Bayer Low Strength 81 Mg Tbec (Aspirin) .... Take 1 tablet by mouth once a day 6)  Spiriva Handihaler 18 Mcg Caps (Tiotropium bromide monohydrate) .... Inhale contents of 1 capsule once a day 7)  Symbicort 160-4.5 Mcg/act Aero (Budesonide-formoterol fumarate) .... Inhale 2 puffs two times a day 8)  Caltrate 600+d Plus 600-400 Mg-unit Tabs (Calcium carbonate-vit d-min) .... One by mouth two times a day with meals 9)  Dilaudid Pump  .... Continuous 10)  Aerochamber Mv Misc (Spacer/aero-holding chambers) .... Use with symbicort 11)  Vitamin D3 50000 Units  .... One by mouth once weekly 12)  Ventolin Hfa 108 (90 Base) Mcg/act Aers (Albuterol sulfate) .... 2 puffs by mouth once daily as needed 13)  Pulse Oximeter  .... Use as needed 14)  Levaquin 500 Mg Tabs (Levofloxacin) .... One by mouth once daily 15)  Majic Mouthwash  .... Swish and swollow 1-2 teaspoonfuls every 4 hours as directed  Patient Instructions: 1)  Please schedule a follow-up appointment in 1 month.   Current Allergies (reviewed today): ! CODEINE ! MORPHINE ! *  RECLAST ! * CONTRAST DYE  headache may be related to relative hypovolemia increase fluids, decrease BP med colitis - viral vs bacterial.  I doubt ischemic colon changes sigmoid, no splenic flexure  try to minimize dilaudid use continue to hold zocor transient LFT elevation likely related to dehydration

## 2010-02-28 NOTE — Progress Notes (Signed)
Summary: diagnosis  Phone Note Call from Patient Call back at [phone]   Caller: [name] [name] - granddaughter Call For: wright Reason for Call: Talk to Nurse Summary of Call: pt saw PEW in [place].  Told her, per granddaughter that she had some other condition that he wanted to bronch her for.  Please call granddaughter and let her know what is going on so that she may explain to her grandmother and get her calmed down. Initial call taken by: Eugene Gavia,  October 05, 2009 10:09 AM  Follow-up for Phone Call        Pt granddaughter wanted to know what was discussed at ov yesterday becasue pt is upset. Grandaughter signed release form.  I read over Impression from OV note and advised of PW recs. Pt granddaughter just wanted to [facility] so she could expalin to pt what exactly PW had discussed because she states the pt was confused.  Grand daughter plans to be with pt at follow-up in October. Carron Curie CMA  October 05, 2009 10:26 AM

## 2010-02-28 NOTE — Assessment & Plan Note (Signed)
Summary: hospital follow up/mhf   Vital Signs:  Patient profile:   77 year old female Weight:      125.75 pounds BMI:     131.26 O2 Sat:      95 % on Room air Temp:     97.9 degrees F oral Pulse rate:   81 / minute Resp:     24 per minute BP sitting:   100 / 62  (left arm) Cuff size:   large  Vitals Entered By: Glendell Docker CMA (February 05, 2010 2:35 PM)  O2 Flow:  Room air CC: Hospital Follow up Is Patient Diabetic? No Pain Assessment Patient in pain? no      Comments cough, unable to bring phlegm up, appointment with Dr Delford Field on Thursday, would like to change to something more cost effective for Bystolic   Primary Care Provider:  D. Thomos Lemons DO  CC:  Hospital Follow up.  History of Present Illness: 77 y/o white female for hosp f/u   "77 year old lady with a past medical history significant for hypertension and frequent abdominal pain who presented with nausea, vomiting, generalized abdominal pain, and diarrhea.  The patient denied any fever or chills and constipation.  Workup included a CT scan of the abdomen which showed pancolitis.  Initially treated with antibiotics but this was discontinued.  The patient continued to improve even while she was off antibiotics.  It was also noted that liver enzymes were elevated but etiology is unclear.  HIDA scan was done to rule out further stones but this was clear.  No evidence of common bile duct obstruction.  It was also thought that part of her abdominal pain may be due to narcotic abuse and dependence.  In any case with hydration and conservative management, the patient's pain resolved."  pt seen by GI.  if symtpoms recur,  ERCP and / or colonoscopy being considered  pt notes she stopped taking dilaudid tabs for breakthrough pain  Preventive Screening-Counseling & Management  Alcohol-Tobacco     Smoking Status: quit  Allergies: 1)  ! Codeine 2)  ! Morphine 3)  ! * Reclast 4)  ! * Contrast Dye  Past  History:  Past Medical History: OSTEOPOROSIS -  Reclast rx 03/07/09 OSTEOARTHRITIS (ICD-715.90)   HIATAL HERNIA (ICD-553.3) GERD (ICD-530.81)    PNEUMONIA (ICD-486) COPD (ICD-496)  HYPOTENSION (ICD-458.9)  PEPTIC ULCER DISEASE (ICD-533.90) CARDIAC ARRHYTHMIA (ICD-427.9) HYPERCHOLESTEROLEMIA (ICD-272.0)  HEART FAILURE (ICD-428.9) HYPERTENSION (ICD-401.9) Hx of Spondylolysis with intractable back pain    Past Surgical History: gallbladder - 2008  hysterectomy - 1970s back surgeries - 1970s, 1995, 2007  neck surgery - 1970s  spinal cord stimulator       Family History: heart disease - father cancer - mother [name] Family History Hypertension          Social History: former smoker, quit in 1990, x109yrs, 1ppd. no alcohol widow    3 children   One grandchild at care link = Angelia lives with great-granddaughter    retired: worked for [employer]  Review of Systems  The patient denies fever and weight loss.    Physical Exam  General:  alert, well-developed, and well-nourished.   Head:  normocephalic and atraumatic.   Eyes:  pupils equal, pupils round, and pupils reactive to light.   Lungs:  normal respiratory effort and normal breath sounds.   Heart:  normal rate, regular rhythm, and no gallop.   Abdomen:  soft, non-tender, and normal bowel sounds.  no masses,  no guarding, and no rigidity.   Extremities:  No lower extremity edema  Neurologic:  cranial nerves II-XII intact and gait normal.   Psych:  normally interactive and good eye contact.     Impression & Recommendations:  Problem # 1:  HYPOXEMIA (ICD-799.02) pt to f/u with Dr. Delford Field unclear if pt experiencing significant nocturnal hypoxemia arrange sleep study  Orders: Sleep Study (Sleep Study)  Problem # 2:  HYPERTENSION (ICD-401.9) Assessment: Unchanged consider taper off bystolic if SBP < 100  Her updated medication list for this problem includes:    Bystolic 5 Mg Tabs (Nebivolol hcl) .....  One half by mouth once daily  BP today: 100/62 Prior BP: 130/70 (01/03/2010)  Labs Reviewed: K+: 4.6 (04/09/2009) Creat: : 0.78 (04/09/2009)   Chol: 151 (04/09/2009)   HDL: 39 (04/09/2009)   LDL: 87 (04/09/2009)   TG: 125 (04/09/2009)  Problem # 3:  COLITIS, ACUTE (ICD-558.9) colitis of unclear etiology I doubt infectious cause consider ischemia colitis   she wakes up with symptoms question over night hypoxia contributing factor  Complete Medication List: 1)  Bystolic 5 Mg Tabs (Nebivolol hcl) .... One half by mouth once daily 2)  Omeprazole 40 Mg Cpdr (Omeprazole) .... One by mouth two times a day 30 min before meals 3)  Bayer Low Strength 81 Mg Tbec (Aspirin) .... Take 1 tablet by mouth once a day 4)  Spiriva Handihaler 18 Mcg Caps (Tiotropium bromide monohydrate) .... Inhale contents of 1 capsule once a day 5)  Symbicort 160-4.5 Mcg/act Aero (Budesonide-formoterol fumarate) .... Inhale 2 puffs two times a day 6)  Caltrate 600+d Plus 600-400 Mg-unit Tabs (Calcium carbonate-vit d-min) .... One by mouth two times a day with meals 7)  Dilaudid Pump  .... Continuous 8)  Aerochamber Mv Misc (Spacer/aero-holding chambers) .... Use with symbicort 9)  Ventolin Hfa 108 (90 Base) Mcg/act Aers (Albuterol sulfate) .[name].. 1 puff by mouth three times a day as needed 10)  Gabapentin 100 Mg Caps (Gabapentin) .... One by mouth three times a day 11)  Oxygen  .... 2l at night 12)  Flutter Devi (Respiratory therapy supplies) .... Use 4 times daily 13)  Vitamin D (ergocalciferol) 50000 Unit Caps (Ergocalciferol) .... One tablet by mouth once weekly 14)  Tandem 162-115.2 Mg Caps (Ferrous fum-iron polysacch) .... Take 1 capsule by mouth once a day 15)  Fioricet 50-325-40 Mg Tabs (Butalbital-apap-caffeine) .... One tablet by mouth  every 8 hours as needed headache 16)  Vitamin C 500 Mg Tabs (Ascorbic acid) .... Take 1 tablet by mouth once a day 17)  Hyoscyamine Sulfate 0.125 Mg Tabs (Hyoscyamine sulfate)  .... Take 1 tablet by mouth once a day as needed 18)  Cipro 500 Mg Tabs (Ciprofloxacin hcl) .... Take one tablet po twice a day 19)  Flagyl 500 Mg Tabs (Metronidazole) .... One by mouth three times a day  Patient Instructions: 1)  Please schedule a follow-up appointment in 6 weeks   Orders Added: 1)  Sleep Study [Sleep Study] 2)  Est. Patient Level IV [16109]    Current Allergies (reviewed today): ! CODEINE ! MORPHINE ! * RECLAST ! * CONTRAST DYE

## 2010-02-28 NOTE — Miscellaneous (Signed)
Summary: PFTs   Pulmonary Function Test Date: 02/08/2009 Height (in.): 60 Gender: Female  Pre-Spirometry FVC    Value: 1.48 L/min   Pred: 2.10 L/min     % Pred: 70 % FEV1    Value: 1.03 L     Pred: 1.38 L     % Pred: 74 % FEV1/FVC  Value: 69 %     Pred: 70 %    FEF 25-75  Value: 0.64 L/min   Pred: 1.72 L/min     % Pred: 37 %  Post-Spirometry FVC    Value: 1.69 L/min   Pred: 2.10 L/min     % Pred: 80 % FEV1    Value: 1.25 L     Pred: 1.38 L     % Pred: 90 % FEV1/FVC  Value: 74 %     Pred: 70 %    FEF 25-75  Value: 0.89 L/min   Pred: 1.72 L/min     % Pred: 52 %  Lung Volumes TLC    Value: 3.77 L   % Pred: 96 % RV    Value: 2.16 L   % Pred: 126 % DLCO    Value: 10.6 %   % Pred: 71 % DLCO/VA  Value: 3.31 %   % Pred: 101 %  Comments: Moderate peripheral airflow obstruction ,  normal lung volumes,  mildly reduced DLCO Clinical Lists Changes  Observations: Added new observation of PFT COMMENTS: Moderate peripheral airflow obstruction ,  normal lung volumes,  mildly reduced DLCO (02/09/2009 12:54) Added new observation of DLCO/VA%EXP: 101 % (02/09/2009 12:54) Added new observation of DLCO/VA: 3.31 % (02/09/2009 12:54) Added new observation of DLCO % EXPEC: 71 % (02/09/2009 12:54) Added new observation of DLCO: 10.6 % (02/09/2009 12:54) Added new observation of RV % EXPECT: 126 % (02/09/2009 12:54) Added new observation of RV: 2.16 L (02/09/2009 12:54) Added new observation of TLC % EXPECT: 96 % (02/09/2009 12:54) Added new observation of TLC: 3.77 L (02/09/2009 12:54) Added new observation of FEF2575%EXPS: 52 % (02/09/2009 12:54) Added new observation of PSTFEF25/75P: 1.72  (02/09/2009 12:54) Added new observation of PSTFEF25/75%: 0.89 L/min (02/09/2009 12:54) Added new observation of FEV1FVCPRDPS: 70 % (02/09/2009 12:54) Added new observation of PSTFEV1/FVC: 74 % (02/09/2009 12:54) Added new observation of POSTFEV1%PRD: 90 % (02/09/2009 12:54) Added new observation of FEV1PRDPST:  1.38 L (02/09/2009 12:54) Added new observation of POST FEV1: 1.25 L/min (02/09/2009 12:54) Added new observation of POST FVC%EXP: 80 % (02/09/2009 12:54) Added new observation of FVCPRDPST: 2.10 L/min (02/09/2009 12:54) Added new observation of POST FVC: 1.69 L (02/09/2009 12:54) Added new observation of FEF % EXPEC: 37 % (02/09/2009 12:54) Added new observation of FEF25-75%PRE: 1.72 L/min (02/09/2009 12:54) Added new observation of FEF 25-75%: 0.64 L/min (02/09/2009 12:54) Added new observation of FEV1/FVC PRE: 70 % (02/09/2009 12:54) Added new observation of FEV1/FVC: 69 % (02/09/2009 12:54) Added new observation of FEV1 % EXP: 74 % (02/09/2009 12:54) Added new observation of FEV1 PREDICT: 1.38 L (02/09/2009 12:54) Added new observation of FEV1: 1.03 L (02/09/2009 12:54) Added new observation of FVC % EXPECT: 70 % (02/09/2009 12:54) Added new observation of FVC PREDICT: 2.10 L (02/09/2009 12:54) Added new observation of FVC: 1.48 L (02/09/2009 12:54) Added new observation of PFT HEIGHT: 60  (02/09/2009 12:54) Added new observation of PFT DATE: 02/08/2009  (02/09/2009 12:54)  Appended Document: PFTs result noted  patient aware

## 2010-02-28 NOTE — Progress Notes (Signed)
Summary: REACTION  Phone Note Call from Patient Call back at [phone]   Caller: Daughter [name] Call For: WRIGHT Summary of Call: PT HAD MED CHANGE. FACE AND ANKLES SWOLLEN CHEST CONGESTION. DAUGHTER CONCERN SHE MAY BE HAVING REACTION . Initial call taken by: Rickard Patience,  April 12, 2009 2:36 PM  Follow-up for Phone Call        called and spoke with angie----she stated that pts meds were changed from atenolol to bystolic over 1 month ago---she has noticed in the last 3 days that she is having some cough--ankles are swollen with pitting edema and her face is red and swollen as well.  she is hypotensive at times and c/o of some CP at times *( she did c/o of CP with the atenolol too)  PW is out of the office today---please advise---she does have appt with TP on 3/21 to discuss these problems but daughter was very concerned about this today.  pt is at pulm rehab now and the RN there did notice some chest rattling/congestion.  please advise   thanks Randell Loop [facility]  April 12, 2009 2:51 PM   Additional Follow-up for Phone Call Additional follow up Details #1::        This sounds more like fluid retention. Suggest trying diuretic maxzide 25, # 5, 1 daily x 5 days to see what effect this has. Additional Follow-up by: Waymon Budge MD,  April 12, 2009 3:12 PM    Additional Follow-up for Phone Call Additional follow up Details #2::    appt made with MW today at 4 pm.  called and spoke with angie and she stated that she would feel better with her being seen today than waiting.  she is aware of the appt with MW and will have pt here at 4pm today.  i explained to her that i will cancel the appt with TP for next week.  i didnt send the maxide in for this pt since she has an appt this afternoon. Randell Loop [facility]  April 12, 2009 3:32 PM

## 2010-02-28 NOTE — Miscellaneous (Signed)
Summary: Order/[facility]  Order/[facility]   Imported By: Lester Fisher 10/03/2009 09:20:53  _____________________________________________________________________  External Attachment:    Type:   Image     Comment:   External Document

## 2010-02-28 NOTE — Assessment & Plan Note (Signed)
Summary: TO EST /hospital f/u   pneumonia rsc from weather   Vital Signs:  Patient profile:   77 year old female Weight:      127.75 pounds BMI:     23.45 O2 Sat:      93 % on Room air Temp:     98.2 degrees F oral Pulse rate:   84 / minute Pulse rhythm:   regular Resp:     18 per minute BP sitting:   114 / 60  (left arm) Cuff size:   regular  Vitals Entered By: Glendell Docker CMA (February 12, 2009 2:35 PM)  O2 Flow:  Room air  Primary Care Provider:  D. Thomos Lemons DO  CC:  New Patient .  History of Present Illness: New Patient   77 y/o white female with hx of COPD, osteoporosis and htn to establish.  Pt previously followed by Dr. Debroah Loop who retired.  she was recently admitted for COPD exacerbation.  daughter reports pt initially improved after hosp discharge but recently caught URI and now has sinus congestion and non prod cough.  no fever chiils.  no worsening SOB. swallow eval done in  hosp.  pt reports study positive.   grand daughter is nurse who reinforced need to take small bites and chin tuck with liquids.  official swallowing eval not avail.  pt has hx of spondylosis and severe low back pain.  she has pain pump in right lower abd and take dilaudid for breakthrough pain  hx of osteoporosis.  she takes actonel.  denies dysphagia.  however, when she takes actonel she can not take her dilaudid as usual which disrupts pain control   copd - seen by [employer].  she was started on symbicort and spiriva.    Allergies: 1)  ! Codeine 2)  ! Morphine 3)  ! * Contrast Dye  Past History:  Past Medical History:  OSTEOARTHRITIS (ICD-715.90) HIATAL HERNIA (ICD-553.3) GERD (ICD-530.81) PNEUMONIA (ICD-486) COPD (ICD-496) HYPOTENSION (ICD-458.9)  PEPTIC ULCER DISEASE (ICD-533.90) CARDIAC ARRHYTHMIA (ICD-427.9) HYPERCHOLESTEROLEMIA (ICD-272.0) HEART FAILURE (ICD-428.9) HYPERTENSION (ICD-401.9) Hx of Spondylolysis with intractable back pain    Past Surgical  History: gallbladder - 2008  hysterectomy - 1970s back surgeries - 1970s, 1995, 2007 neck surgery - 1970s spinal cord stimulator  Family History: heart disease - father cancer - mother [name] Family History Hypertension    Social History: former smoker, quit in 1990, x61yrs, 1ppd. no alcohol widow  3 children lives with great-granddaughter  retired: worked for [employer]  Review of Systems       The patient complains of prolonged cough.  The patient denies fever, chest pain, abdominal pain, and severe indigestion/heartburn.    Physical Exam  General:  alert, well-developed, and well-nourished.   Head:  normocephalic and atraumatic.   Eyes:  pupils equal, pupils round, and pupils reactive to light.   Ears:  R ear normal and L ear normal.  decreased hearing Mouth:  upper dental plate, no white plaques, pharynx pink and moist.   Neck:  supple and no masses.   Lungs:  normal respiratory effort.  decreased breath sounds.  prolonged expiration Heart:  normal rate, regular rhythm, and no gallop.   Abdomen:  soft, non-tender, and normal bowel sounds.  right lower quad - pain pump Extremities:  No lower extremity edema  Neurologic:  cranial nerves II-XII intact and gait normal.   Psych:  normally interactive, good eye contact, not anxious appearing, and not depressed appearing.  Impression & Recommendations:  Problem # 1:  COPD (ICD-496) 77 y/o with non prod cough and sinus congestion.  no new infiltrate on CXR.  tx with ceftin.  continue inhalers.   arrange nebulizer with duoneb qid Her updated medication list for this problem includes:    Proair Hfa 108 (90 Base) Mcg/act Aers (Albuterol sulfate) ..... Inhale 2 puffs every four hours as needed    Spiriva Handihaler 18 Mcg Caps (Tiotropium bromide monohydrate) ..... Inhale contents of 1 capsule once a day    Symbicort 160-4.5 Mcg/act Aero (Budesonide-formoterol fumarate) ..... Inhale 2 puffs two times a  day  Orders: T-2 View CXR, Same Day (71020.5TC)  Problem # 2:  ASPIRATION PNEUMONIA (ICD-507.0) chronic dilaudid use likely increasing her risk of aspiration.  use hosp bed at home and raise head of bed.  continue aspiration precautions  Orders: Misc. Referral (Misc. Ref) Misc. Referral (Misc. Ref)  Problem # 3:  OSTEOPOROSIS (ICD-733.00) when she takes actonel she can not take her usual pain meds with exac chronic back pain.   arrange yearly ReClast infusion.  check vit d level.  The following medications were removed from the medication list:    Actonel 150 Mg Tabs (Risedronate sodium) ..... One tablet by mouth once monthly  Orders: Misc. Referral (Misc. Ref)  Problem # 4:  HYPERTENSION (ICD-401.9) hx tachycardia.  change atenolol to metoprolol Her updated medication list for this problem includes:    Metoprolol Succinate 50 Mg Xr24h-tab (Metoprolol succinate) ..... One by mouth qd  BP today: 114/60 Prior BP: 120/66 (02/01/2009)  Complete Medication List: 1)  Simvastatin 20 Mg Tabs (Simvastatin) .... Take 1 tab by mouth at bedtime 2)  Metoprolol Succinate 50 Mg Xr24h-tab (Metoprolol succinate) .... One by mouth qd 3)  Prilosec 20 Mg Cpdr (Omeprazole) .... Take 1 capsule by mouth two times a day 4)  Flexeril 10 Mg Tabs (Cyclobenzaprine hcl) .... Take 1 tablet by mouth two times a day 5)  Dilaudid 4 Mg Tabs (Hydromorphone hcl) .... Take 1 tablet by mouth two times a day 6)  Bayer Low Strength 81 Mg Tbec (Aspirin) .... Take 1 tablet by mouth once a day 7)  Proair Hfa 108 (90 Base) Mcg/act Aers (Albuterol sulfate) .... Inhale 2 puffs every four hours as needed 8)  Spiriva Handihaler 18 Mcg Caps (Tiotropium bromide monohydrate) .... Inhale contents of 1 capsule once a day 9)  Symbicort 160-4.5 Mcg/act Aero (Budesonide-formoterol fumarate) .... Inhale 2 puffs two times a day 10)  Cefuroxime Axetil 500 Mg Tabs (Cefuroxime axetil) .... One by mouth two times a day 11)  Caltrate  600+d Plus 600-400 Mg-unit Tabs (Calcium carbonate-vit d-min) .... One by mouth two times a day with meals  Patient Instructions: 1)  Please schedule a follow-up appointment in 2 months. 2)  BMP prior to visit, ICD-9: 401.9 3)  Hepatic Panel prior to visit, ICD-9: 272.4 4)  Lipid Panel prior to visit, ICD-9: 272.4 5)  TSH prior to visit, ICD-9: 272.4 6)  CBC w/ Diff prior to visit, ICD-9: 496.0 7)  Vitamin D level:  733.00 8)  Please return for lab work one (1) week before your next appointment.  Prescriptions: METOPROLOL SUCCINATE 50 MG XR24H-TAB (METOPROLOL SUCCINATE) one by mouth qd  #30 x 2   Entered and Authorized by:   D. Thomos Lemons DO   Signed by:   D. Thomos Lemons DO on 02/12/2009   Method used:   Electronically to  [facility] (retail)       [address]       [place], [place]  [id number]       Ph: [phone]       Fax: [phone]   RxID:   [id number] CEFUROXIME AXETIL 500 MG TABS (CEFUROXIME AXETIL) one by mouth two times a day  #20 x 0   Entered and Authorized by:   D. Thomos Lemons DO   Signed by:   D. Thomos Lemons DO on 02/12/2009   Method used:   Electronically to        [facility] (retail)       [phone]       [place], [place]  [id number]       Ph: [phone]       Fax: [phone]   RxID:   [id number]    Immunization History:  Influenza Immunization History:    Influenza:  historical (01/16/2009)  Pneumovax Immunization History:    Pneumovax:  historical (02/13/2004)    Preventive Care Screening  Last Flu Shot:    Date:  01/16/2009    Results:  Historical   Bone Density:    Date:  12/15/2006    Results:  Done std dev  Pap Smear:    Date:  01/06/2006    Results:  Declined    Last Pneumovax:    Date:  02/13/2004    Results:  Historical   Mammogram:    Date:  12/05/2003    Results:  normal

## 2010-02-28 NOTE — Progress Notes (Signed)
Summary: clarification  Phone Note Call from Patient Call back at [phone]   Caller: Daughter Call For: wright Summary of Call: Has question about inhalers. Initial call taken by: Darletta Moll,  February 23, 2009 1:11 PM  Follow-up for Phone Call        Spoke with pts grandaughter, she was confused about dose for symbicort, instructed her to use symbicort 2 puffs two times a day and rinse after per Dr. Florene Route orders Renold Genta RCP, LPN  February 23, 2009 1:55 PM

## 2010-02-28 NOTE — Miscellaneous (Signed)
Summary: Orders Update pft charges  Clinical Lists Changes  Orders: Added new Service order of Carbon Monoxide diffusing w/capacity (94720) - Signed Added new Service order of Lung Volumes (94240) - Signed Added new Service order of Spirometry (Pre & Post) (94060) - Signed 

## 2010-02-28 NOTE — Progress Notes (Signed)
Summary: Home Health Care Services  Phone Note Call from Patient Call back at [phone]   Caller: Grandaughter- [name] Cox Call For: [name] DO Summary of Call: patients grandaughter called and left voice message stating patient was discharged from the hospital on 6/13  and has follow up with Dr Artist Pais on 6/23. Her message states patient was discharged on Home Health Nursing Services, Physical Therapy and Occupational therapy. She would like to know if Dr Artist Pais would send a  order for those services to [facility] and also have them draw a complete metabolic panel as directed on discharge prior to  patients appointments with Dr Artist Pais. Initial call taken by: Glendell Docker CMA,  July 11, 2009 4:39 PM  Follow-up for Phone Call        patients daughter called back and left voice message stating she has not heard anything back regarding her phone message about a referral for patient to [place]. Call was returned to [phone], no answer, voice message left informing grandaughter that she should receive a phone call on Monday regarding the referral. Message was left for her to call and ask for [facility] Follow-up by: Glendell Docker CMA,  July 13, 2009 4:03 PM  Additional Follow-up for Phone Call Additional follow up Details #1::        order was placed Additional Follow-up by: D. Thomos Lemons DO,  July 16, 2009 12:29 PM    Additional Follow-up for Phone Call Additional follow up Details #2::    patients grandaughter [name] advised per Dr Artist Pais instructions Follow-up by: Glendell Docker CMA,  July 16, 2009 1:19 PM

## 2010-02-28 NOTE — Letter (Signed)
Summary: [facility]  [facility]   Imported By: Lanelle Bal 02/12/2010 10:01:16  _____________________________________________________________________  External Attachment:    Type:   Image     Comment:   External Document

## 2010-02-28 NOTE — Assessment & Plan Note (Signed)
Summary: Pulmonary OV   Primary [name] [name]/Referring [name] [name]:  [name] DO  CC:  Follow up.  sore throat, cough - occ prod with gray to yellow mucus, chest congestion, wheezing x 8-10 days.  Denies increased SOB, and f/c/s.[name]  History of Present Illness: 77 yowf  July 19, 2009 11:58 AM This pt was admitted 6/10 -6/13 for RLL CAP and colitis of sigmoid.  All c/s data was neg. Pt had AMS.  She responded to flagyl and levaquin. Last CXR showed no PNA and CT chest essentially neg. Echo normal.   The diarrhea is now resolved.  Pt notes now bowels are better,  no diarrhea,  no n/v  no abd pain,   Pt still with cough and tightness in chest , occ prod mucus yellow,  no real pndrip.  No chest pain. No edema in feet,  but did have edema before.  Still with chronic headache.  Head hurts across forehead and to the side of the head.  The pt remains on oxygen at bedtime along with symbicort and spiriva.  October 04, 2009 1:33 PM Pt notes since 6/11.  having sore throat  started two weeks ago.   recent CT chest.   no real fever.  The pt si  still with cough and is congested. The mucus is yellow and gray. There is  no real chest pain.  The pt  notes some headaches.   THe pt is not dyspneic.  There is  no real edema.  The Head aches on front and top The pt was ecent rx with  cleocin A recent CT  chest  noted with LL tree in bud pattern ?MAC . CT sinus was normal   Preventive Screening-Counseling & Management  Alcohol-Tobacco     Smoking Status: quit  Current Medications (verified): 1)  Bystolic 5 Mg Tabs (Nebivolol Hcl) .... One Half By Mouth Once Daily 2)  Omeprazole 40 Mg Cpdr (Omeprazole) .... One By Mouth Two Times A Day 30 Min Before Meals 3)  Flexeril 10 Mg Tabs (Cyclobenzaprine Hcl) .... Take 1 Tablet By Mouth Two Times A Day 4)  Dilaudid 4 Mg Tabs (Hydromorphone Hcl) .... Take 1 Tablet By Mouth Two Times A Day 5)  Bayer Low Strength 81 Mg Tbec (Aspirin) .... Take 1 Tablet By Mouth Once A  Day 6)  Spiriva Handihaler 18 Mcg Caps (Tiotropium Bromide Monohydrate) .... Inhale Contents of 1 Capsule Once A Day 7)  Symbicort 160-4.5 Mcg/act Aero (Budesonide-Formoterol Fumarate) .... Inhale 2 Puffs Two Times A Day 8)  Caltrate 600+d Plus 600-400 Mg-Unit Tabs (Calcium Carbonate-Vit D-Min) .... One By Mouth Two Times A Day With Meals 9)  Dilaudid Pump .... Continuous 10)  Aerochamber Mv  Misc (Spacer/aero-Holding Chambers) .... Use With Symbicort 11)  Vitamin D3 50000 Units .... One By Mouth Once Weekly 12)  Ventolin Hfa 108 (90 Base) Mcg/act Aers (Albuterol Sulfate) .... 2 Puffs By Mouth Once Daily As Needed 13)  Pulse Oximeter .... Use As Needed 14)  Majic Mouthwash .... Swish and Swollow 1-2 Teaspoonfuls Every 4 Hours As Directed 15)  Gabapentin 100 Mg Caps (Gabapentin) .... One By Mouth Three Times A Day  Allergies (verified): 1)  ! Codeine 2)  ! Morphine 3)  ! * Reclast 4)  ! * Contrast Dye  Past History:  Past medical, surgical, family and social histories (including risk factors) reviewed, and no changes noted (except as noted below).  Past Medical History: Reviewed history from 09/28/2009 and no  changes required. OSTEOPOROSIS -  Reclast rx 03/07/09 OSTEOARTHRITIS (ICD-715.90)  HIATAL HERNIA (ICD-553.3) GERD (ICD-530.81)   PNEUMONIA (ICD-486) COPD (ICD-496)  HYPOTENSION (ICD-458.9)  PEPTIC ULCER DISEASE (ICD-533.90) CARDIAC ARRHYTHMIA (ICD-427.9) HYPERCHOLESTEROLEMIA (ICD-272.0)  HEART FAILURE (ICD-428.9) HYPERTENSION (ICD-401.9) Hx of Spondylolysis with intractable back pain    Past Surgical History: Reviewed history from 09/28/2009 and no changes required. gallbladder - 2008  hysterectomy - 1970s back surgeries - 1970s, 1995, 2007  neck surgery - 1970s  spinal cord stimulator      Past Pulmonary History:  Pulmonary History: Pulmonary Function Test  Date: 02/08/2009 Height (in.): 60 Gender: Female  Pre-Spirometry  FVC     Value: 1.48 L/min   Pred:  2.10 L/min     % Pred: 70 % FEV1     Value: 1.03 L     Pred: 1.38 L     % Pred: 74 % FEV1/FVC   Value: 69 %     Pred: 70 %     FEF 25-75   Value: 0.64 L/min   Pred: 1.72 L/min     % Pred: 37 %  Post-Spirometry  FVC     Value: 1.69 L/min   Pred: 2.10 L/min     % Pred: 80 % FEV1     Value: 1.25 L     Pred: 1.38 L     % Pred: 90 % FEV1/FVC   Value: 74 %     Pred: 70 %     FEF 25-75   Value: 0.89 L/min   Pred: 1.72 L/min     % Pred: 52 %  Lung Volumes  TLC     Value: 3.77 L   % Pred: 96 % RV     Value: 2.16 L   % Pred: 126 % DLCO     Value: 10.6 %   % Pred: 71 % DLCO/VA   Value: 3.31 %   % Pred: 101 %  Comments:  Moderate peripheral airflow obstruction ,  normal lung volumes,  mildly reduced DLCO   Family History: Reviewed history from 09/28/2009 and no changes required. heart disease - father cancer - mother [name] Family History Hypertension        Social History: Reviewed history from 09/28/2009 and no changes required. former smoker, quit in 1990, x83yrs, 1ppd. no alcohol widow    3 children   One grandchild at care link = Angelia lives with great-granddaughter  retired: worked for [employer]  Review of Systems       The patient complains of shortness of breath with activity, shortness of breath at rest, productive cough, and non-productive cough.  The patient denies coughing up blood, chest pain, irregular heartbeats, acid heartburn, indigestion, loss of appetite, weight change, abdominal pain, difficulty swallowing, sore throat, tooth/dental problems, headaches, nasal congestion/difficulty breathing through nose, sneezing, itching, ear ache, anxiety, depression, hand/feet swelling, joint stiffness or pain, rash, change in color of mucus, and fever.    Vital Signs:  Patient profile:   77 year old female Height:      62 inches Weight:      126 pounds BMI:     23.13 O2 Sat:      91 % on Room air Temp:     98.4 degrees F oral Pulse rate:   72 / minute BP sitting:    130 / 70  (left arm) Cuff size:   regular  Vitals Entered By: Gweneth Dimitri RN (October 04, 2009 1:25 PM)  O2  Flow:  Room air CC: Follow up.  sore throat, cough - occ prod with gray to yellow mucus, chest congestion, wheezing x 8-10 days.  Denies increased SOB, f/c/s. Comments Medications reviewed with patient Daytime contact number verified with patient. Gweneth Dimitri RN  October 04, 2009 1:27 PM    Physical Exam  Additional Exam:  Gen: Pleasant, well-nourished, in no distress , normal affect ENT: no lesions, no post nasal drip Neck: No JVD, no TMG, no carotid bruits Lungs: No use of accessory muscles, no dullness to percussion,  distant BS, exp wheeze RLL and LLL Cardiovascular: RRR, heart sounds normal, no murmurs or gallop, trace pretibial edema bilaterally Abdomen: soft and non-tender, no HSM, BS normal Musculoskeletal: No deformities, no cyanosis or clubbing Neuro: alert, non-focal     CT of Chest  Procedure date:  09/28/2009  Findings:      IMPRESSION:   1.  Bilateral thyroid nodules. 2.  Spinal cord stimulator noted in the lower thoracic canal. 3.  Patchy areas of peribronchial thickening and peripheral branching nodularity (tree in bud appearance).  This is likely a mild inflammatory or infectious process.  MAC is a possibility. 4.  No focal airspace consolidation or pulmonary edema.   IMPRESSION: No evidence of acute sinusitis.  Incomplete opacification of the left mastoid air cells.  Impression & Recommendations:  Problem # 1:  COPD (ICD-496) Assessment Unchanged copd with recent tree in bud pattern LLL on Ct chest ? mac infection.  also poss chronic aspiration .  I advised FOB but pt has declined plan empiric levaquin 500mg /d x 10days f/u with repeat cxr in one month  ? if dllaudid is exacerbating tendency to aspirate  No change in inhaled medications.   Maintain treatment program as currently prescribed.  Medications Added to Medication List  This Visit: 1)  Levaquin 500 Mg Tabs (Levofloxacin) .... One by mouth daily  Complete Medication List: 1)  Bystolic 5 Mg Tabs (Nebivolol hcl) .... One half by mouth once daily 2)  Omeprazole 40 Mg Cpdr (Omeprazole) .... One by mouth two times a day 30 min before meals 3)  Flexeril 10 Mg Tabs (Cyclobenzaprine hcl) .... Take 1 tablet by mouth two times a day 4)  Dilaudid 4 Mg Tabs (Hydromorphone hcl) .... Take 1 tablet by mouth two times a day 5)  Bayer Low Strength 81 Mg Tbec (Aspirin) .... Take 1 tablet by mouth once a day 6)  Spiriva Handihaler 18 Mcg Caps (Tiotropium bromide monohydrate) .... Inhale contents of 1 capsule once a day 7)  Symbicort 160-4.5 Mcg/act Aero (Budesonide-formoterol fumarate) .... Inhale 2 puffs two times a day 8)  Caltrate 600+d Plus 600-400 Mg-unit Tabs (Calcium carbonate-vit d-min) .... One by mouth two times a day with meals 9)  Dilaudid Pump  .... Continuous 10)  Aerochamber Mv Misc (Spacer/aero-holding chambers) .... Use with symbicort 11)  Vitamin D3 50000 Units  .... One by mouth once weekly 12)  Ventolin Hfa 108 (90 Base) Mcg/act Aers (Albuterol sulfate) .... 2 puffs by mouth once daily as needed 13)  Pulse Oximeter  .... Use as needed 14)  Majic Mouthwash  .... Swish and swollow 1-2 teaspoonfuls every 4 hours as directed 15)  Gabapentin 100 Mg Caps (Gabapentin) .... One by mouth three times a day 16)  Levaquin 500 Mg Tabs (Levofloxacin) .... One by mouth daily  Other Orders: Est. Patient Level IV (29562)  Patient Instructions: 1)  Levaquin one daily  (10days) 2)  No other medication changes  3)  Discuss stopping dilaudid pump with the pain specialist 4)  Return one month with a Chest xray first Prescriptions: LEVAQUIN 500 MG TABS (LEVOFLOXACIN) One by mouth daily  #10 x 0   Entered and Authorized by:   Storm Frisk MD   Signed by:   Storm Frisk MD on 10/04/2009   Method used:   Electronically to        [facility]  (retail)       [phone]       [place], [place]  [id number]       Ph: [phone]       Fax: [phone]   RxID:   [id number]

## 2010-02-28 NOTE — Progress Notes (Signed)
Summary: Lift Chair Form status  Phone Note Call from Patient   Caller: patient grand daughter Marina Gravel Call For: Artist Pais  Summary of Call: call grand daughter [phone] when rx and med Arther Dames for lift chair is faxed  [phone] Initial call taken by: Roselle Locus,  January 01, 2010 10:08 AM  Follow-up for Phone Call        call returned to patients grandaughter, she states that she was informed by Charlcie Cradle that the paperwork has been on Dr Waynette Buttery desk. Marylene Land was informed that I have not seen the paperwork, and would look into the status, and return a phone call to her.  Follow-up by: Glendell Docker CMA,  January 15, 2010 9:54 AM  Additional Follow-up for Phone Call Additional follow up Details #1::        call was returned to patients granddaughter. Marylene Land stated the form would come from  [facility]. She was advised to have them refax refax form. She was informed after speaking with Dr Artist Pais, no paperwork was found for the lift chair request.  Additional Follow-up by: Glendell Docker CMA,  January 15, 2010 10:06 AM    Additional Follow-up for Phone Call Additional follow up Details #2::    certficate of Medical Necessity form recieved from [facility]. Forwarded to Dr Artist Pais for signature Glendell Docker CMA  January 15, 2010 11:29 AM   form faxed to [facility], and patients grandaughter Marina Gravel has been advised to check with [facility] this morning to make sure that they have the information needed to process the lift chair request. If not, she was advised to call by the end of the day Glendell Docker [facility]  January 18, 2010 8:47 AM   Additional Follow-up for Phone Call Additional follow up Details #3:: Details for Additional Follow-up Action Taken: call placed to  [phone], no answer. Voice message was left for return call to find out if [facility] [place] [facility] has processed  patients medical necessity for Wheelchair lift.  Call was placed to [facility] at [phone],  spoke with Annabelle Harman she has confirmed certificate of medical necessity is complete and it will be sent to file after review. No additional information needed at this time Additional Follow-up by: Glendell Docker CMA,  January 22, 2010 3:28 PM

## 2010-02-28 NOTE — Progress Notes (Signed)
Summary: Needs OV ASAP-appt has been made  ---- Converted from flag ---- ---- 09/28/2009 4:49 PM, Storm Frisk MD wrote: this [place] f[place] ------------------------------  Phone Note Outgoing Call   Call placed by: Gweneth Dimitri RN,  September 28, 2009 5:15 PM Call placed to: Patient Summary of Call: Per PW, pt needs needs OV ASAP in [place].  [facility]OMTC[facility] to schedule this.   Initial call taken by: Gweneth Dimitri RN,  September 28, 2009 5:16 PM  Follow-up for Phone Call        appt this thurs in [place]/ spoke to pt's daughter today- she is aware. Tivis Ringer, CNA  October 02, 2009 9:01 AM  OV scheduled with PW for Thursday by the schedulers.  Will sign off on this phone note. Follow-up by: Gweneth Dimitri RN,  October 02, 2009 10:29 AM

## 2010-02-28 NOTE — Progress Notes (Signed)
Summary: Fioricet   Phone Note Call from Patient   Caller: Patient Call For: yoo  Summary of Call: she was given fioricet 325-50-40 for her headache while she was in the hospital.  She is out.  She is scheduled for her Hospital follow up on Thursday.  could Dr Artist Pais refill till then.  [facility]  Initial call taken by: Roselle Locus,  December 24, 2009 1:48 PM  Follow-up for Phone Call        ok to refill x 1 only Follow-up by: D. Thomos Lemons DO,  December 24, 2009 3:57 PM  Additional Follow-up for Phone Call Additional follow up Details #1::        Rx Called In, call placed to patient at [phone], no answer, no voice mail Additional Follow-up by: Glendell Docker CMA,  December 24, 2009 4:47 PM    New/Updated Medications: VENTOLIN HFA 108 (90 BASE) MCG/ACT AERS (ALBUTEROL SULFATE) 1 puff by mouth three times a day as needed VITAMIN D (ERGOCALCIFEROL) 50000 UNIT CAPS (ERGOCALCIFEROL) one tablet by mouth once weekly TANDEM 162-115.2 MG CAPS (FERROUS FUM-IRON POLYSACCH) Take 1 capsule by mouth once a day FIORICET 50-325-40 MG TABS (BUTALBITAL-APAP-CAFFEINE) one tablet by mouth  every 8 hours as needed headache VITAMIN C 500 MG TABS (ASCORBIC ACID) Take 1 tablet by mouth once a day  Prescriptions: FIORICET 50-325-40 MG TABS (BUTALBITAL-APAP-CAFFEINE) one tablet by mouth  every 8 hours as needed headache  #30 x 0   Entered by:   Glendell Docker CMA   Authorized by:   D. Thomos Lemons DO   Signed by:   Glendell Docker CMA on 12/24/2009   Method used:   Telephoned to ...       [facility] (retail)       [address]       [place], [place]  [id number]       Ph: [phone]       Fax: [phone]   RxID:   [id number]

## 2010-02-28 NOTE — Progress Notes (Signed)
Summary: Gabapentin Refill  Phone Note Refill Request Message from:  Fax from Pharmacy on October 29, 2009 12:03 PM  Refills Requested: Medication #1:  GABAPENTIN 100 MG CAPS one by mouth three times a day.   Dosage confirmed as above?Dosage Confirmed   Brand Name Necessary? No   Supply Requested: 1 month   Last Refilled: 08/17/2009  Method Requested: Electronic Next Appointment Scheduled: 11-01-09 dR wRIGHT Initial call taken by: Roselle Locus,  October 29, 2009 12:04 PM  Follow-up for Phone Call        ok to refill x 3 Follow-up by: D. Thomos Lemons DO,  October 30, 2009 2:43 PM  Additional Follow-up for Phone Call Additional follow up Details #1::        Rx sent to pharamcy Additional Follow-up by: Glendell Docker CMA,  October 30, 2009 2:48 PM    Prescriptions: GABAPENTIN 100 MG CAPS (GABAPENTIN) one by mouth three times a day  #90 x 3   Entered by:   Glendell Docker CMA   Authorized by:   D. Thomos Lemons DO   Signed by:   Glendell Docker CMA on 10/30/2009   Method used:   Electronically to        [facility] (retail)       [phone] [address]       [place], [place]  [id number]       Ph: [phone]       Fax: [phone]   RxID:   [id number]

## 2010-02-28 NOTE — Assessment & Plan Note (Signed)
Summary: Pulmonary OV   Primary Provider/Referring Provider:  [name] DO  CC:  Pt here for follow up. Pt c/o S.O.B with activity and lips being blue when active, non-productive cough, and and heart palpatations since starting new neb medications and BP med.  History of Present Illness: 77 yo female with known hx COPD.    02/01/2009-- Presents for post hosp follow up. Admitted 12/25-12/27/10 for COPD exacerbation. She has a hx of  degenerative disk disease on  chronic Dilaudid was brought in by her family for increased shortness of  breath.  She was diagnosed with a COPD exacerbation and right lower lobe   pneumonia. Concern for probably  episode of  aspiration.  She was tx w/ aggressive pulmonary hygiene and IV abx . Improved w/ transition over to oral/inhaler meds at discharge. She was last seen 2007 by Dr. Sherene Sires for her COPD managment. She has plans to establish with Dr. Artist Pais and would like to be seen at our [facility] office. Since discharge she is slowly improving, continues to get out of breath quite easily. She is currently on Symbicort 160 and Spiriva. Denies chest pain,  orthopnea, hemoptysis, fever, n/v/d, edema, headache.  February 22, 2009 10:41 AM Not as good as the 02/01/09.  Pt notes lips turning blue.  Pt is more dyspneic.  More cough but is dry.  Not on oxygen at home.  No chest pain.  Now on neb plus symbicort plus spiriva    Preventive Screening-Counseling & Management  Alcohol-Tobacco     Smoking Status: quit > 6 months  Current Medications (verified): 1)  Simvastatin 20 Mg Tabs (Simvastatin) .... Take 1 Tab By Mouth At Bedtime 2)  Metoprolol Succinate 50 Mg Xr24h-Tab (Metoprolol Succinate) .... One By Mouth Qd 3)  Prilosec 20 Mg Cpdr (Omeprazole) .... Take 1 Capsule By Mouth Two Times A Day 4)  Flexeril 10 Mg Tabs (Cyclobenzaprine Hcl) .... Take 1 Tablet By Mouth Two Times A Day 5)  Dilaudid 4 Mg Tabs (Hydromorphone Hcl) .... Take 1 Tablet By Mouth Two Times A  Day 6)  Bayer Low Strength 81 Mg Tbec (Aspirin) .... Take 1 Tablet By Mouth Once A Day 7)  Spiriva Handihaler 18 Mcg Caps (Tiotropium Bromide Monohydrate) .... Inhale Contents of 1 Capsule Once A Day 8)  Symbicort 160-4.5 Mcg/act Aero (Budesonide-Formoterol Fumarate) .... Inhale 2 Puffs Two Times A Day 9)  Cefuroxime Axetil 500 Mg Tabs (Cefuroxime Axetil) .... One By Mouth Two Times A Day 10)  Caltrate 600+d Plus 600-400 Mg-Unit Tabs (Calcium Carbonate-Vit D-Min) .... One By Mouth Two Times A Day With Meals 11)  Duoneb 0.5-2.5 (3) Mg/27ml Soln (Ipratropium-Albuterol) .... Three Times A Day 12)  Dilaudid Pump .... Continuous  Allergies (verified): 1)  ! Codeine 2)  ! Morphine 3)  ! * Contrast Dye  Past History:  Past medical, surgical, family and social histories (including risk factors) reviewed, and no changes noted (except as noted below).  Past Medical History: Reviewed history from 02/12/2009 and no changes required.  OSTEOARTHRITIS (ICD-715.90) HIATAL HERNIA (ICD-553.3) GERD (ICD-530.81) PNEUMONIA (ICD-486) COPD (ICD-496) HYPOTENSION (ICD-458.9)  PEPTIC ULCER DISEASE (ICD-533.90) CARDIAC ARRHYTHMIA (ICD-427.9) HYPERCHOLESTEROLEMIA (ICD-272.0) HEART FAILURE (ICD-428.9) HYPERTENSION (ICD-401.9) Hx of Spondylolysis with intractable back pain    Past Surgical History: Reviewed history from 02/12/2009 and no changes required. gallbladder - 2008  hysterectomy - 1970s back surgeries - 1970s, 1995, 2007 neck surgery - 1970s spinal cord stimulator  Family History: Reviewed history from 02/12/2009 and no  changes required. heart disease - father cancer - mother [name] Family History Hypertension    Social History: Reviewed history from 02/12/2009 and no changes required. former smoker, quit in 1990, x73yrs, 1ppd. no alcohol widow  3 children lives with great-granddaughter  retired: worked for [employer] Smoking Status:  quit > 6 months  Review of Systems        The patient complains of shortness of breath with activity, shortness of breath at rest, and non-productive cough.  The patient denies productive cough, coughing up blood, chest pain, irregular heartbeats, acid heartburn, indigestion, loss of appetite, weight change, abdominal pain, difficulty swallowing, sore throat, tooth/dental problems, headaches, nasal congestion/difficulty breathing through nose, sneezing, itching, ear ache, anxiety, depression, hand/feet swelling, joint stiffness or pain, rash, change in color of mucus, and fever.    Vital Signs:  Patient profile:   77 year old female Height:      62 inches Weight:      127 pounds BMI:     23.31 O2 Sat:      89 % on Room air Temp:     98.9 degrees F oral Pulse rate:   100 / minute BP sitting:   122 / 80  (left arm) Cuff size:   regular  Vitals Entered By: Zackery Barefoot CMA (February 22, 2009 10:37 AM)  O2 Flow:  Room air  O2 Sat Comments arrived to exam room with o2 sat of 89% ra.  After being placed on 2L o2 cont, o2 sat increased to 95%.  Serial Vital Signs/Assessments:  Comments: Ambulatory Pulse Oximetry  Resting; HR__88___    02 Sat__90%RA___  Lap1 (185 feet)   HR__110___   02 Sat__89%RA___ Lap2 (185 feet)   HR__119___   02 Sat__88%RA___    Lap3 (185 feet)   HR_____   02 Sat_____  ___Test Completed without Difficulty _x__Test Stopped due to: pt S.O.B and desat to 88%RA, pt recovered to 90%RA after rest.  Zackery Barefoot CMA  February 22, 2009 12:07 PM      By: Zackery Barefoot CMA   CC: Pt here for follow up. Pt c/o S.O.B with activity and lips being blue when active, non-productive cough, and heart palpatations since starting new neb medications and BP med Comments Medications reviewed with patient Zackery Barefoot CMA  February 22, 2009 10:38 AM    Physical Exam  Additional Exam:  Gen: Pleasant, well-nourished, in no distress , normal affect ENT: no lesions, no post nasal drip Neck: No JVD, no TMG,  no carotid bruits Lungs: No use of accessory muscles, no dullness to percussion, inspir /expir wheezes, distant bs  Cardiovascular: RRR, heart sounds normal, no murmurs or gallops, no peripheral edema Abdomen: soft and non-tender, no HSM, BS normal Musculoskeletal: No deformities, no cyanosis or clubbing Neuro: alert, non-focal     Sleep Study  Procedure date:  02/08/2009  Findings:      Low Oxygen Sat:  Oximetry overnight on      RA            =   80%    Pulmonary Function Test Date: 02/08/2009 Height (in.): 60 Gender: Female  Pre-Spirometry FVC    Value: 1.48 L/min   Pred: 2.10 L/min     % Pred: 70 % FEV1    Value: 1.03 L     Pred: 1.38 L     % Pred: 74 % FEV1/FVC  Value: 69 %     Pred: 74 %    FEF 25-75  Value: 0.64 L/min   Pred: 1.72 L/min     % Pred: 37 %  Post-Spirometry FVC    Value: 1.69 L/min   Pred: 2.10 L/min     % Pred: 80 % FEV1    Value: 1.25 L     Pred: 1.38 L     % Pred: 90 % FEV1/FVC  Value: 74 %     Pred: 70 %    FEF 25-75  Value: 0.89 L/min   Pred: 1.72 L/min     % Pred: 52 %  Lung Volumes TLC    Value: 3.77 L   % Pred: 96 % RV    Value: 2.16 L   % Pred: 126 % DLCO    Value: 10.6 %   % Pred: 71 % DLCO/VA  Value: 3.31 %   % Pred: 101 %  Comments: Small airway obstruction, normal lung volumes, mild decrease in DLCO  Impression & Recommendations:  Problem # 1:  COPD (ICD-496) Assessment Deteriorated COPD with ongoing airway inflammation and 2% nocturnal desaturation, need for more selective beta blocker  plan Start oxygen 2Liter rest, 3Liter exertion obtain today from [facility] main office Start Bystolic 10mg  (2 - 5mg  tab or 1 - 10mg  tab) daily Stop Metoprolol Stop duoneb Stay on Symbicort and Spiriva daily Prednisone 10mg  4 each am x3days, 3 x 3days, 2 x 3days, 1 x 3days then stop Return [place] One week  Medications Added to Medication List This Visit: 1)  Bystolic 10 Mg Tabs (Nebivolol hcl) .... One tablet daily 2)  Duoneb 0.5-2.5 (3) Mg/59ml  Soln (Ipratropium-albuterol) .... Three times a day 3)  Dilaudid Pump  .... Continuous 4)  Prednisone 10 Mg Tabs (Prednisone) .... Take as directed 4 each am x3days, 3 x 3days, 2 x 3days, 1 x 3days then stop 5)  Aerochamber Mv Misc (Spacer/aero-holding chambers) .... Use with symbicort  Complete Medication List: 1)  Simvastatin 20 Mg Tabs (Simvastatin) .... Take 1 tab by mouth at bedtime 2)  Bystolic 10 Mg Tabs (Nebivolol hcl) .... One tablet daily 3)  Prilosec 20 Mg Cpdr (Omeprazole) .... Take 1 capsule by mouth two times a day 4)  Flexeril 10 Mg Tabs (Cyclobenzaprine hcl) .... Take 1 tablet by mouth two times a day 5)  Dilaudid 4 Mg Tabs (Hydromorphone hcl) .... Take 1 tablet by mouth two times a day 6)  Bayer Low Strength 81 Mg Tbec (Aspirin) .... Take 1 tablet by mouth once a day 7)  Spiriva Handihaler 18 Mcg Caps (Tiotropium bromide monohydrate) .... Inhale contents of 1 capsule once a day 8)  Symbicort 160-4.5 Mcg/act Aero (Budesonide-formoterol fumarate) .... Inhale 2 puffs two times a day 9)  Caltrate 600+d Plus 600-400 Mg-unit Tabs (Calcium carbonate-vit d-min) .... One by mouth two times a day with meals 10)  Dilaudid Pump  .... Continuous 11)  Prednisone 10 Mg Tabs (Prednisone) .... Take as directed 4 each am x3days, 3 x 3days, 2 x 3days, 1 x 3days then stop 12)  Aerochamber Mv Misc (Spacer/aero-holding chambers) .... Use with symbicort  Other Orders: EKG w/ Interpretation (93000) Est. Patient Level V (62952) DME Referral (DME)  Patient Instructions: 1)  Start oxygen 2Liter rest, 3Liter exertion obtain today from [facility] main office 2)  Start Bystolic 10mg  (2 - 5mg  tab or 1 - 10mg  tab) daily 3)  Stop Metoprolol 4)  Stop duoneb 5)  Stay on Symbicort and Spiriva daily 6)  Prednisone 10mg  4 each am x3days, 3 x 3days,  2 x 3days, 1 x 3days then stop 7)  Return [place] One week Prescriptions: AEROCHAMBER MV  MISC (SPACER/AERO-HOLDING CHAMBERS) Use with Symbicort  #1 x 0    Entered and Authorized by:   Storm Frisk MD   Signed by:   Storm Frisk MD on 02/22/2009   Method used:   Print then Give to Patient   RxID:   [id number] PREDNISONE 10 MG  TABS (PREDNISONE) Take as directed 4 each am x3days, 3 x 3days, 2 x 3days, 1 x 3days then stop  #30 x 0   Entered and Authorized by:   Storm Frisk MD   Signed by:   Storm Frisk MD on 02/22/2009   Method used:   Electronically to        [facility] (retail)       [id number]       [place], [place]  [address]       Ph: [phone]       Fax: [phone]   RxID:   [id number] BYSTOLIC 10 MG  TABS (NEBIVOLOL HCL) One tablet daily  #30 x 6   Entered and Authorized by:   Storm Frisk MD   Signed by:   Storm Frisk MD on 02/22/2009   Method used:   Print then Give to Patient   RxID:   [id number]     Appended Document: Pulmonary OV Note ECG obtained today revealed NSR,  normal ECG

## 2010-02-28 NOTE — Miscellaneous (Signed)
Summary: Care Plan/[facility]  Care Plan/[facility]   Imported By: Lanelle Bal 01/08/2010 12:42:43  _____________________________________________________________________  External Attachment:    Type:   Image     Comment:   External Document

## 2010-02-28 NOTE — Letter (Signed)
Summary: Primary Care Consult Scheduled Letter  [place] [facility]  [address]   [place], [place] [address]   Phone: [phone]  Fax: [phone]      12/28/2009 MRN: [id number]  SHONTE BEUTLER [address] [place], [place]  [address]    Dear Ms. Piercey,      We have scheduled an appointment for you.  At the recommendation of Dr Artist Pais, we have scheduled you a consult with [facility]   on JANUARY 25,2011 at10:15AM.  Their address is_[address]   [place] The office phone number is [phone].  If this appointment day and time is not convenient for you, please feel free to call the office of the doctor you are being referred to at the number listed above and reschedule the appointment.     It is important for you to keep your scheduled appointments. We are here to make sure you are given good patient care. If you have questions or you have made changes to your appointment, please notify us at  [phone], ask for HELEN.    Thank you,  Darral Dash Patient Care Coordinator [place] [facility]

## 2010-02-28 NOTE — Progress Notes (Signed)
Summary: CXR  Phone Note Outgoing Call   Reason for Call: Discuss lab or test results Summary of Call: call the pt and tell her cxr is ok,  no active disease Initial call taken by: Storm Frisk MD,  February 07, 2010 4:24 PM  Follow-up for Phone Call        Called, spoke with pt.  She was informed of above CXR results per PW and verbalized understanding. Follow-up by: Gweneth Dimitri RN,  February 11, 2010 8:47 AM

## 2010-02-28 NOTE — Progress Notes (Signed)
Summary: Swelling/Pain  Phone Note Call from Patient Call back at [phone]   Caller: Daughter Reason for Call: Talk to Nurse Summary of Call: Eunice Blase (daughter, not the nurse) swelling in legs is worse, pain is much worse, pt feels like there is a connection to vitamin D. Pls call to advise. Initial call taken by: Lannette Donath,  May 03, 2009 3:56 PM  Follow-up for Phone Call        call returned to [name] at [phone] no ansewr, voice message left informing patients daughter call was beign retruned to her Follow-up by: Glendell Docker CMA,  May 03, 2009 4:28 PM  Additional Follow-up for Phone Call Additional follow up Details #1::        Eunice Blase called back [phone], pls leave detailed message on her answering machine as to whether you think it's the vitamin D that is causing the swelling, [name] cannot get to the phone easily since she is caring for someone with stage 4 cancer Diane Tomerlin  May 03, 2009 4:52 PM SW Pt-appt has been scheduled for 4/13 Diane Tomerlin  May 07, 2009 9:38 AM    Additional Follow-up for Phone Call Additional follow up Details #2::    needs OV to reassess edema.  please schedule within 1-2 days Follow-up by: D. Thomos Lemons DO,  May 06, 2009 8:18 PM

## 2010-02-28 NOTE — Assessment & Plan Note (Signed)
Summary: Pulmonary/ ext ov  - doubt reaction to reclast   Primary Provider/Referring Provider:  [name] DO  CC:  Acute visit.  Pt c/o swelling in face and both ankles x 1 wk. She also c/o pressure in the center of chest that "comes and goes"- better with tums.  She also c/o increased cough for several days- mostly dry and in the am..  History of Present Illness: 4 yowf quit smoking 1990 due to bad cough, improved   02/01/2009-- Presents for post hosp follow up. Admitted 12/25-12/27/10 for COPD exacerbation. She has a hx of  degenerative disk disease on  chronic Dilaudid was brought in by her family for increased shortness of  breath.  She was diagnosed with a COPD exacerbation and right lower lobe   pneumonia. Concern for probably  episode of  aspiration.  She was tx w/ aggressive pulmonary hygiene and IV abx . Improved w/ transition over to oral/inhaler meds at discharge. She was last seen 2007 by Dr. Sherene Sires for her COPD managment. She has plans to establish with Dr. Artist Pais and would like to be seen at our [facility] Pulmonary office. Since discharge she is slowly improving, continues to get out of breath quite easily. She is currently on Symbicort 160 and Spiriva. Denies chest pain,  orthopnea, hemoptysis, fever, n/v/d, edema, headache.  February 22, 2009 10:41 AM Not as good as the 02/01/09.  Pt notes lips turning blue.  Pt is more dyspneic.  More cough but is dry.  Not on oxygen at home.  No chest pain.  Now on neb plus symbicort plus spiriva   March 01, 2009  At last ov on 1/27 we rx: Start oxygen 2Liter rest, 3Liter exertion obtain today from [facility] main office Start Bystolic 10mg  (2 - 5mg  tab or 1 - 10mg  tab) daily Stop Metoprolol Stop duoneb Stay on Symbicort and Spiriva daily Prednisone 10mg  4 each am x3days, 3 x 3days, 2 x 3days, 1 x 3days then stop Still dyspneic with any exertion but is better compared to 1/27 ov.     March 15, 2009 --Presents for follow up to discuss reclast.  She has hx of osteoporosis .w/ hx of steroid exposure d/t  COPD flares. She has been intolerant to oral boniva and fosamax. She would like to discuss reclast. She is here today to set up infusion. she has no hx of fx.  She is currently on caltrate two times a day.  rec no change in resp rx  April 12, 2009 Acute visit.  Pt c/o swelling in face and both ankles x 1 wk. She also c/o pressure in the center of chest that "comes and goes"- better with tums, no classic lateralizing or exertional cp.  She also c/o increased cough for several days- mostly dry and in the am. No pain in supine position.  Pt denies any significant sore throat, dysphagia, itching, sneezing,  nasal congestion or excess secretions,  fever, chills, sweats, unintended wt loss, pleuritic or exertional cp, hempoptysis, change in activity tolerance  orthopnea pnd.  Current Medications (verified): 1)  Simvastatin 20 Mg Tabs (Simvastatin) .... Take 1 Tab By Mouth At Bedtime 2)  Bystolic 10 Mg  Tabs (Nebivolol Hcl) .... One Tablet Daily 3)  Prilosec 20 Mg Cpdr (Omeprazole) .... Take 1 Capsule By Mouth Two Times A Day 4)  Flexeril 10 Mg Tabs (Cyclobenzaprine Hcl) .... Take 1 Tablet By Mouth Two Times A Day 5)  Dilaudid 4 Mg Tabs (Hydromorphone Hcl) .Marland KitchenMarland KitchenMarland Kitchen  Take 1 Tablet By Mouth Two Times A Day 6)  Bayer Low Strength 81 Mg Tbec (Aspirin) .... Take 1 Tablet By Mouth Once A Day 7)  Spiriva Handihaler 18 Mcg Caps (Tiotropium Bromide Monohydrate) .... Inhale Contents of 1 Capsule Once A Day 8)  Symbicort 160-4.5 Mcg/act Aero (Budesonide-Formoterol Fumarate) .... Inhale 2 Puffs Two Times A Day 9)  Caltrate 600+d Plus 600-400 Mg-Unit Tabs (Calcium Carbonate-Vit D-Min) .... One By Mouth Two Times A Day With Meals 10)  Dilaudid Pump .... Continuous 11)  Aerochamber Mv  Misc (Spacer/aero-Holding Chambers) .... Use With Symbicort 12)  Vitamin D (Ergocalciferol) 50000 Unit Caps (Ergocalciferol) .[name].. 1 Capsule By Mouth Once A Week  Allergies  (verified): 1)  ! Codeine 2)  ! Morphine 3)  ! * Contrast Dye  Past History:  Past Medical History: OSTEOPOROSIS -  Reclast rx 03/07/09 OSTEOARTHRITIS (ICD-715.90) HIATAL HERNIA (ICD-553.3) GERD (ICD-530.81) PNEUMONIA (ICD-486) COPD (ICD-496) HYPOTENSION (ICD-458.9)  PEPTIC ULCER DISEASE (ICD-533.90) CARDIAC ARRHYTHMIA (ICD-427.9) HYPERCHOLESTEROLEMIA (ICD-272.0) HEART FAILURE (ICD-428.9) HYPERTENSION (ICD-401.9) Hx of Spondylolysis with intractable back pain    Social History: former smoker, quit in 1990, x55yrs, 1ppd. no alcohol widow  3 children One grandchild at care link = Angelia lives with great-granddaughter  retired: worked for [employer] of [employer]  Vital Signs:  Patient profile:   77 year old female Weight:      137.50 pounds O2 Sat:      92 % on 3 L/min Temp:     98.5 degrees F oral Pulse rate:   94 / minute BP sitting:   120 / 72  (left arm)  Vitals Entered By: Vernie Murders (April 12, 2009 4:03 PM)  O2 Flow:  3 L/min  Physical Exam  Additional Exam:  Gen: Pleasant, well-nourished, in no distress , normal affect ENT: no lesions, no post nasal drip Neck: No JVD, no TMG, no carotid bruits Lungs: No use of accessory muscles, no dullness to percussion, inspir /expir wheezes, distant bs  Cardiovascular: RRR, heart sounds normal, no murmurs or gallop, trace pretibial edema bilaterally Abdomen: soft and non-tender, no HSM, BS normal Musculoskeletal: No deformities, no cyanosis or clubbing Neuro: alert, non-focal     CXR  Procedure date:  04/12/2009  Findings:       Comparison: Chest x-ray of 02/12/2009   Findings: The lungs are clear and remains slightly hyperaerated. The heart is mildly enlarged and stable.  The bones are slightly osteopenic.  Neurostimulator electrode leads are unchanged, overlying the mid lower thoracic spinal canal.   IMPRESSION: Stable chest x-ray with hyperaeration.  No active lung disease.  Impression &  Recommendations:  Problem # 1:  COPD (ICD-496) Complicated by chronic hypoxemic rf and ? incipient cor pulmonale but no sign elevation of bnp so no change is rx needed at this point other than salt avoidance and compliance with 24 hour 02  Problem # 2:  OSTEOPOROSIS (ICD-733.00) s/p reclast 03/07/09, no evidence of sign systemic reaction  Problem # 3:  GERD (ICD-530.81)  Her updated medication list for this problem includes:    Prilosec 20 Mg Cpdr (Omeprazole) .[name]... Take one 30-60 min before first and last meals of the day  CP is either GERD or IBS related - the   Classic subdiaphragmatic pain pattern suggests ibs:  daytime, not exacerbated by ex or coughing, worse in sitting position, associated with generalized abd bloating, not present supine due to the dome effect of the diaphram canceled in that position.    Rx See  instructions for specific recommendations   Medications Added to Medication List This Visit: 1)  Prilosec 20 Mg Cpdr (Omeprazole) .... Take one 30-60 min before first and last meals of the day  Other Orders: TLB-BNP (B-Natriuretic Peptide) (83880-BNPR) T-2 View CXR (71020TC) Est. Patient Level IV (40981)  Patient Instructions: 1)  Change omeprazole to Take one 30-60 min before first and last meals of the day 2)  avoid foods that cause gas 3)  avoid salt  4)  Keep appt with Dr Delford Field in [facility]

## 2010-02-28 NOTE — Progress Notes (Signed)
Summary: mouth sores:  rx mmw  Phone Note Call from Patient Call back at [phone]   Caller: angie Call For: wright Summary of Call: pt have sores in her mouth would like magic mouthwash [facility] Initial call taken by: Rickard Patience,  March 02, 2009 9:13 AM  Follow-up for Phone Call        pt has white sores in her mouth x 1 week. Pt was not rinsing after using her inhaler at first, but she is now. Pt requesting rx for MMW. Please advise. Carron Curie CMA  March 02, 2009 10:10 AM ok with me rx 8oz use 1-2 tsp q 4 h prn  Follow-up by: Nyoka Cowden MD,  March 02, 2009 10:26 AM  Additional Follow-up for Phone Call Additional follow up Details #1::        rx sent. [name] aware.Carron Curie CMA  March 02, 2009 10:41 AM     New/Updated Medications: * MAGIC MOUTHWASH swish and swallow 1-2 tsp every 4 hours as needed Prescriptions: MAGIC MOUTHWASH swish and swallow 1-2 tsp every 4 hours as needed  #8 oz x 0   Entered by:   Carron Curie CMA   Authorized by:   Nyoka Cowden MD   Signed by:   Carron Curie CMA on 03/02/2009   Method used:   Telephoned to ...       [facility] (retail)       [address]       [place], [place]  [address]       Ph: [phone]       Fax: [phone]   RxID:   [id number]

## 2010-02-28 NOTE — Letter (Signed)
Summary: Primary Care Consult Scheduled Letter  [facility]  [address]   [place], [place] [address]   Phone: [phone]  Fax: [phone]      08/20/2009 MRN: [id number]  SHAUNTEE KARP [address] [place], [place]  [address]    Dear Ms. Sago,      We have scheduled an appointment for you.  At the recommendation of Dr.YOO, we have scheduled you a consult with DR Garen Grams  on  AUGUST  3,2011 at [facility] .  Their address [phone], [place] . The office phone number is [phone].  If this appointment day and time is not convenient for you, please feel free to call the office of the doctor you are being referred to at the number listed above and reschedule the appointment.     It is important for you to keep your scheduled appointments. We are here to make sure you are given good patient care. If you have questions or you have made changes to your appointment, please notify us at  [phone], ask for HELEN.    Thank you,  Patient Care Coordinator [place] [facility]

## 2010-02-28 NOTE — Miscellaneous (Signed)
Summary: Discontinue Home Care Services/[facility]  Discontinue Home Care Services/[facility]   Imported By: Maryln Gottron 09/04/2009 12:29:53  _____________________________________________________________________  External Attachment:    Type:   Image     Comment:   External Document

## 2010-02-28 NOTE — Letter (Signed)
Summary: CMN for Nebulizer/[facility]  CMN for Nebulizer/[facility]   Imported By: Lanelle Bal 02/20/2009 09:20:51  _____________________________________________________________________  External Attachment:    Type:   Image     Comment:   External Document

## 2010-02-28 NOTE — Assessment & Plan Note (Signed)
Summary: hospital follow up/mhf--Rm 3   Vital Signs:  Patient profile:   77 year old female Height:      62 inches Weight:      125.25 pounds BMI:     22.99 O2 Sat:      95 % on Room air Temp:     98.9 degrees F oral Pulse rate:   80 / minute Pulse rhythm:   regular Resp:     18 per minute BP sitting:   130 / 70  (right arm) Cuff size:   regular  Vitals Entered By: Mervin Kung CMA Duncan Dull) (December 27, 2009 1:25 PM)  O2 Flow:  Room air CC: Pt here for hospital follow up, Headaches Is Patient Diabetic? No Comments Pt needs refills on Spiriva and Fioricet. Nicki Guadalajara Fergerson CMA Duncan Dull)  December 27, 2009 1:32 PM    Primary Care Provider:  [name] DO  CC:  Pt here for hospital follow up and Headaches.  History of Present Illness:  Headaches      This is an 77 year old woman who presents with Headaches.  The patient denies nausea, vomiting, and sinus pain.  The headache is described as constant and throbbing.     Preventive Screening-Counseling & Management  Alcohol-Tobacco     Smoking Status: quit  Allergies: 1)  ! Codeine 2)  ! Morphine 3)  ! * Reclast 4)  ! * Contrast Dye  Past History:  Past Medical History: OSTEOPOROSIS -  Reclast rx 03/07/09 OSTEOARTHRITIS (ICD-715.90)   HIATAL HERNIA (ICD-553.3) GERD (ICD-530.81)   PNEUMONIA (ICD-486) COPD (ICD-496)  HYPOTENSION (ICD-458.9)  PEPTIC ULCER DISEASE (ICD-533.90) CARDIAC ARRHYTHMIA (ICD-427.9) HYPERCHOLESTEROLEMIA (ICD-272.0)  HEART FAILURE (ICD-428.9) HYPERTENSION (ICD-401.9) Hx of Spondylolysis with intractable back pain    Past Surgical History: gallbladder - 2008  hysterectomy - 1970s back surgeries - 1970s, 1995, 2007  neck surgery - 1970s  spinal cord stimulator      Family History: heart disease - father cancer - mother [name] Family History Hypertension         Social History: former smoker, quit in 1990, x57yrs, 1ppd. no alcohol widow    3 children   One grandchild at  care link = Angelia lives with great-granddaughter   retired: worked for [employer] of [employer]  Physical Exam  General:  alert, well-developed, and well-nourished.   Eyes:  pupils equal, pupils round, and pupils reactive to light.   Lungs:  normal respiratory effort and normal breath sounds.   Heart:  normal rate, regular rhythm, and no gallop.   Neurologic:  cranial nerves II-XII intact.     Impression & Recommendations:  Problem # 1:  HEADACHE (ICD-784.0) 77 y/o with persistent headache.  CT of brain 12/02/2009 - 1.  Mild atrophy and small vessel disease.  2. No evidence for acute intracranial abnormality.  ESR - 44  12/02/2009  question headache assoc with neurostimulator some improvement with fioricet refer to headache clinic for further eval and tx  Her updated medication list for this problem includes:    Bystolic 5 Mg Tabs (Nebivolol hcl) ..... One half by mouth once daily    Dilaudid 4 Mg Tabs (Hydromorphone hcl) .[name]... Take 1 tablet by mouth once daily    Bayer Low Strength 81 Mg Tbec (Aspirin) .[name]... Take 1 tablet by mouth once a day    Fioricet 50-325-40 Mg Tabs (Butalbital-apap-caffeine) ..... One tablet by mouth  every 8 hours as needed headache  Orders: Headache Clinic Referral (Headache)  Problem # 2:  PANCREATITIS (ICD-577.0) Assessment: Improved  Problem # 3:  ANEMIA (ICD-285.9)  Her updated medication list for this problem includes:    Tandem 162-115.2 Mg Caps (Ferrous fum-iron polysacch) .[name]... Take 1 capsule by mouth once a day  Hgb: 10.7 (04/09/2009)   Hct: 35.2 (04/09/2009)   Platelets: 159 (04/09/2009) RBC: 3.71 (04/09/2009)   RDW: 14.2 (04/09/2009)   WBC: 4.1 (04/09/2009) MCV: 94.9 (04/09/2009)   MCHC: 30.4 (04/09/2009) TSH: 2.074 (04/09/2009)  Complete Medication List: 1)  Bystolic 5 Mg Tabs (Nebivolol hcl) .... One half by mouth once daily 2)  Omeprazole 40 Mg Cpdr (Omeprazole) .... One by mouth two times a day 30 min before meals 3)  Dilaudid 4 Mg Tabs  (Hydromorphone hcl) .... Take 1 tablet by mouth once daily 4)  Bayer Low Strength 81 Mg Tbec (Aspirin) .... Take 1 tablet by mouth once a day 5)  Spiriva Handihaler 18 Mcg Caps (Tiotropium bromide monohydrate) .... Inhale contents of 1 capsule once a day 6)  Symbicort 160-4.5 Mcg/act Aero (Budesonide-formoterol fumarate) .... Inhale 2 puffs two times a day 7)  Caltrate 600+d Plus 600-400 Mg-unit Tabs (Calcium carbonate-vit d-min) .... One by mouth two times a day with meals 8)  Dilaudid Pump  .... Continuous 9)  Aerochamber Mv Misc (Spacer/aero-holding chambers) .... Use with symbicort 10)  Ventolin Hfa 108 (90 Base) Mcg/act Aers (Albuterol sulfate) .[name].. 1 puff by mouth three times a day as needed 11)  Gabapentin 100 Mg Caps (Gabapentin) .... One by mouth three times a day 12)  Oxygen  .... 2l at night 13)  Flutter Devi (Respiratory therapy supplies) .... Use 4 times daily 14)  Vitamin D (ergocalciferol) 50000 Unit Caps (Ergocalciferol) .... One tablet by mouth once weekly 15)  Tandem 162-115.2 Mg Caps (Ferrous fum-iron polysacch) .... Take 1 capsule by mouth once a day 16)  Fioricet 50-325-40 Mg Tabs (Butalbital-apap-caffeine) .... One tablet by mouth  every 8 hours as needed headache 17)  Vitamin C 500 Mg Tabs (Ascorbic acid) .... Take 1 tablet by mouth once a day  Patient Instructions: 1)  Please schedule a follow-up appointment in 3 months. Prescriptions: FIORICET 50-325-40 MG TABS (BUTALBITAL-APAP-CAFFEINE) one tablet by mouth  every 8 hours as needed headache  #30 x 0   Entered and Authorized by:   D. Thomos Lemons DO   Signed by:   D. Thomos Lemons DO on 12/27/2009   Method used:   Electronically to        [facility] (retail)       [phone]       [place], [place]  [id number]       Ph: [phone]       Fax: [phone]   RxID:   [id number]    Orders Added: 1)  Headache Clinic Referral [Headache] 2)  Est. Patient Level III  [02725]     Current Allergies (reviewed today): ! CODEINE ! MORPHINE ! * RECLAST ! * CONTRAST DYE

## 2010-02-28 NOTE — Procedures (Signed)
Summary: Upper Endoscopy  [name] Note: All result statuses are Final unless otherwise noted.  Tests: (1) Upper Endoscopy (EGD)   EGD Upper Endoscopy       DONE     [facility]     [address]     [place], [place]  [address]           ENDOSCOPY PROCEDURE REPORT           PATIENT:  [name] [name], [name] [name]  MR#:  [id number]     BIRTHDATE:  [birth year], 84 yrs. old  GENDER:  female     NDOSCOPIST:  Judie Petit T. Russella Dar, MD, [facility]     Referred by:  [facility]     PROCEDURE DATE:  12/04/2009     PROCEDURE:  EGD, diagnostic 19147     ASA CLASS:  Class II     INDICATIONS:  abnormal CT of abdomen, epigastric pain, anemia     MEDICATIONS:  Fentanyl 75 mcg IV, Versed 5 mg IV     TOPICAL ANESTHETIC:  Cetacaine Spray     DESCRIPTION OF PROCEDURE:   After the risks benefits and     alternatives of the procedure were thoroughly explained, informed     consent was obtained.  The EG-[id number] ([id number]) endoscope was     introduced through the mouth and advanced to the third portion of     the duodenum, without limitations.  The instrument was slowly     withdrawn as the mucosa was fully examined.     <<PROCEDUREIMAGES>>     A deformity was found in the antrum with scarring suggestive of     prior ulcer disease  The esophagus and gastroesophageal junction     were completely normal in appearance.  The duodenal bulb was     normal in appearance, as was the postbulbar duodenum.  The stomach     was entered and closely examined. The plyorus, antrum, angularis,     and lesser curvature were well visualized, including a retroflexed     view of the cardia and fundus. The stomach wall was normally     distensable. The scope passed easily through the pylorus into the     duodenum.  Retroflexed views revealed a hiatal hernia., small.     The scope was then withdrawn from the patient and the procedure     completed.     COMPLICATIONS:  None           ENDOSCOPIC IMPRESSION:     1) Deformity in the antrum     2) Small hiatal hernia           RECOMMENDATIONS:     1) PPI bid for 4 weeks then PPI qam long term     2) CT scan of  abdomen , pancreatic protocol, in 4-6 weeks, if     abnormalities persist consider EUS or MRCP     3) Minimize or avoid NSAIDs           Malcolm T. Russella Dar, MD, [facility]           CC:  Thomos Lemons, DO           n.     eSIGNED:   Venita Lick. Stark at 12/04/2009 03:21 PM           [name], [id number]  Note: An exclamation mark (!) indicates a result that was not dispersed into the flowsheet. Document Creation Date: 12/04/2009  3:21 PM _______________________________________________________________________  (1) Order result status: Final Collection or observation date-time: 12/04/2009 15:13 Requested date-time:  Receipt date-time:  Reported date-time:  Referring Physician:   Ordering Physician: Claudette Head [phone]) Specimen Source:  Source: Launa Grill Order Number: [id number] Lab site:

## 2010-02-28 NOTE — Medication Information (Signed)
Summary: Med Issue Order/[facility]  Med Issue Order/[facility]   Imported By: Lanelle Bal 08/03/2009 08:59:20  _____________________________________________________________________  External Attachment:    Type:   Image     Comment:   External Document

## 2010-02-28 NOTE — Progress Notes (Signed)
Summary: Med question  Phone Note Call from Patient   Caller: Patient Call For: D. Thomos Lemons DO Summary of Call: Does pt. need to take inhalers along with nebulizer that Dr.Yoo has ordered? Call [name]-grandaughter back TODAYat [phone]... She is at her grandmother's house now trying to straighten her meds out.  Initial call taken by: Michaelle Copas,  February 15, 2009 4:14 PM  Follow-up for Phone Call        see can stop proair while using nebulizer but continue symbicort Follow-up by: D. Thomos Lemons DO,  February 15, 2009 5:20 PM  Additional Follow-up for Phone Call Additional follow up Details #1::        patients granddaughter [name] advised per Dr Artist Pais instructions. She has verbalized understanding of how medication is to be used.  Additional Follow-up by: Glendell Docker CMA,  February 15, 2009 6:02 PM

## 2010-02-28 NOTE — Miscellaneous (Signed)
Summary: Verify Face to Face Encounter/[facility]  Verify Face to Face Encounter/[facility]   Imported By: Lanelle Bal 07/31/2009 10:06:07  _____________________________________________________________________  External Attachment:    Type:   Image     Comment:   External Document

## 2010-02-28 NOTE — Letter (Signed)
Summary: CMN for Oximetry Testing/[facility]  CMN for Oximetry Testing/[facility]   Imported By: Lanelle Bal 10/30/2009 12:29:30  _____________________________________________________________________  External Attachment:    Type:   Image     Comment:   External Document

## 2010-02-28 NOTE — Progress Notes (Signed)
Summary: Lab results   Phone Note Outgoing Call   Reason for Call: Discuss lab or test results Summary of Call: call pt and tell her all labs ok Initial call taken by: Storm Frisk MD,  February 08, 2010 9:10 AM  Follow-up for Phone Call        Called, spoke with pt.  She was informed of above lab results per PW and verbalized understanding. Gweneth Dimitri RN  February 11, 2010 8:46 AM

## 2010-02-28 NOTE — Assessment & Plan Note (Signed)
Summary: Pulmonary OV   Primary Provider/Referring Provider:  [name] DO  CC:  HFU.  Pt states she is better since discharge but still having prod cough with yellow mucus.  .  History of Present Illness: 6 yowf quit smoking 1990 due to bad cough, improved   March 15, 2009 --Presents for follow up to discuss reclast. She has hx of osteoporosis .w/ hx of steroid exposure d/t  COPD flares. She has been intolerant to oral boniva and fosamax. She would like to discuss reclast. She is here today to set up infusion. she has no hx of fx.  She is currently on caltrate two times a day.  rec no change in resp rx  April 12, 2009 Acute visit.  Pt c/o swelling in face and both ankles x 1 wk. She also c/o pressure in the center of chest that "comes and goes"- better with tums, no classic lateralizing or exertional cp.  She also c/o increased cough for several days- mostly dry and in the am. No pain in supine position.  Pt denies any significant sore throat, dysphagia, itching, sneezing,  nasal congestion or excess secretions,  fever, chills, sweats, unintended wt loss, pleuritic or exertional cp, hempoptysis, change in activity tolerance  orthopnea pnd.  May 10, 2009 12:08 PM Had swelling with reclast.  This was the first injection.  Red and swollen in legs.  Still with edema now in face. Now not much cough.  No real congestion.   No chest pain.    July 19, 2009 11:58 AM This pt was admitted 6/10 -6/13 for RLL CAP and colitis of sigmoid.  All c/s data was neg. Pt had AMS.  She responded to flagyl and levaquin. Last CXR showed no PNA and CT chest essentially neg. Echo normal.   The diarrhea is now resolved.  Pt notes now bowels are better,  no diarrhea,  no n/v  no abd pain,   Pt still with cough and tightness in chest , occ prod mucus yellow,  no real pndrip.  No chest pain. No edema in feet,  but did have edema before.  Still with chronic headache.  Head hurts across forehead and to the side of  the head.  The pt remains on oxygen at bedtime along with symbicort and spiriva.   Preventive Screening-Counseling & Management  Alcohol-Tobacco     Smoking Status: quit > 6 months  Current Medications (verified): 1)  Bystolic 5 Mg Tabs (Nebivolol Hcl) .... One By Mouth Once Daily 2)  Omeprazole 40 Mg Cpdr (Omeprazole) .... One By Mouth Two Times A Day 30 Min Before Meals 3)  Flexeril 10 Mg Tabs (Cyclobenzaprine Hcl) .... Take 1 Tablet By Mouth Two Times A Day 4)  Dilaudid 4 Mg Tabs (Hydromorphone Hcl) .... Take 1 Tablet By Mouth Two Times A Day 5)  Bayer Low Strength 81 Mg Tbec (Aspirin) .... Take 1 Tablet By Mouth Once A Day 6)  Spiriva Handihaler 18 Mcg Caps (Tiotropium Bromide Monohydrate) .... Inhale Contents of 1 Capsule Once A Day 7)  Symbicort 160-4.5 Mcg/act Aero (Budesonide-Formoterol Fumarate) .... Inhale 2 Puffs Two Times A Day 8)  Caltrate 600+d Plus 600-400 Mg-Unit Tabs (Calcium Carbonate-Vit D-Min) .... One By Mouth Two Times A Day With Meals 9)  Dilaudid Pump .... Continuous 10)  Aerochamber Mv  Misc (Spacer/aero-Holding Chambers) .... Use With Symbicort 11)  Vitamin D3 1000 Unit Caps (Cholecalciferol) .... One By Mouth Once Daily 12)  Ventolin Hfa 108 (90 Base)  Mcg/act Aers (Albuterol Sulfate) .... 2 Puffs By Mouth Once Daily As Needed 13)  Pulse Oximeter .... Use As Needed  Allergies (verified): 1)  ! Codeine 2)  ! Morphine 3)  ! * Reclast 4)  ! * Contrast Dye  Past History:  Past medical, surgical, family and social histories (including risk factors) reviewed, and no changes noted (except as noted below).  Past Medical History: Reviewed history from 06/19/2009 and no changes required. OSTEOPOROSIS -  Reclast rx 03/07/09 OSTEOARTHRITIS (ICD-715.90) HIATAL HERNIA (ICD-553.3) GERD (ICD-530.81)  PNEUMONIA (ICD-486) COPD (ICD-496)  HYPOTENSION (ICD-458.9)  PEPTIC ULCER DISEASE (ICD-533.90) CARDIAC ARRHYTHMIA (ICD-427.9) HYPERCHOLESTEROLEMIA (ICD-272.0) HEART  FAILURE (ICD-428.9) HYPERTENSION (ICD-401.9) Hx of Spondylolysis with intractable back pain    Past Surgical History: Reviewed history from 06/19/2009 and no changes required. gallbladder - 2008  hysterectomy - 1970s back surgeries - 1970s, 1995, 2007 neck surgery - 1970s spinal cord stimulator     Past Pulmonary History:  Pulmonary History: Pulmonary Function Test  Date: 02/08/2009 Height (in.): 60 Gender: Female  Pre-Spirometry  FVC     Value: 1.48 L/min   Pred: 2.10 L/min     % Pred: 70 % FEV1     Value: 1.03 L     Pred: 1.38 L     % Pred: 74 % FEV1/FVC   Value: 69 %     Pred: 70 %     FEF 25-75   Value: 0.64 L/min   Pred: 1.72 L/min     % Pred: 37 %  Post-Spirometry  FVC     Value: 1.69 L/min   Pred: 2.10 L/min     % Pred: 80 % FEV1     Value: 1.25 L     Pred: 1.38 L     % Pred: 90 % FEV1/FVC   Value: 74 %     Pred: 70 %     FEF 25-75   Value: 0.89 L/min   Pred: 1.72 L/min     % Pred: 52 %  Lung Volumes  TLC     Value: 3.77 L   % Pred: 96 % RV     Value: 2.16 L   % Pred: 126 % DLCO     Value: 10.6 %   % Pred: 71 % DLCO/VA   Value: 3.31 %   % Pred: 101 %  Comments:  Moderate peripheral airflow obstruction ,  normal lung volumes,  mildly reduced DLCO  Family History: Reviewed history from 04/16/2009 and no changes required. heart disease - father cancer - mother [name] Family History Hypertension     Social History: Reviewed history from 06/19/2009 and no changes required. former smoker, quit in 1990, x45yrs, 1ppd. no alcohol widow   3 children One grandchild at care link = Angelia lives with great-granddaughter  retired: worked for [employer]  Review of Systems       The patient complains of productive cough, non-productive cough, and headaches.  The patient denies shortness of breath with activity, shortness of breath at rest, coughing up blood, chest pain, irregular heartbeats, acid heartburn, indigestion, loss of appetite, weight change, abdominal  pain, difficulty swallowing, sore throat, tooth/dental problems, nasal congestion/difficulty breathing through nose, sneezing, itching, ear ache, anxiety, depression, hand/feet swelling, joint stiffness or pain, rash, change in color of mucus, and fever.    Vital Signs:  Patient profile:   77 year old female Height:      62 inches Weight:      125 pounds BMI:  22.95 O2 Sat:      95 % on Room air Temp:     98.3 degrees F oral Pulse rate:   88 / minute BP sitting:   112 / 64  (left arm) Cuff size:   regular  Vitals Entered By: Gweneth Dimitri RN (July 19, 2009 11:52 AM)  O2 Flow:  Room air CC: HFU.  Pt states she is better since discharge but still having prod cough with yellow mucus.   Comments Medications reviewed with patient Daytime contact number verified with patient. Gweneth Dimitri RN  July 19, 2009 11:52 AM    Physical Exam  Additional Exam:  Gen: Pleasant, well-nourished, in no distress , normal affect ENT: no lesions, no post nasal drip Neck: No JVD, no TMG, no carotid bruits Lungs: No use of accessory muscles, no dullness to percussion, no wheeze, distant BS Cardiovascular: RRR, heart sounds normal, no murmurs or gallop, trace pretibial edema bilaterally Abdomen: soft and non-tender, no HSM, BS normal Musculoskeletal: No deformities, no cyanosis or clubbing Neuro: alert, non-focal     Impression & Recommendations:  Problem # 1:  COPD (ICD-496) Assessment Unchanged Cor pulmonale, copd Golds Stage III, nocturnal oxygen dependent s/p recent RLL CAP now resolved. plan No change in inhaled medications.   Maintain treatment program as currently prescribed. cont nocturnal oxygen therapy  Problem # 2:  PNEUMONIA (ICD-486) Assessment: Improved RLL CAP  resolved plan no further ABX Orders: Est. Patient Level IV (04540)  Problem # 3:  COLITIS, ACUTE (ICD-558.9) Assessment: Improved Acute colitis of sigmoid colon ?etiology, now resolved plan per PCP Dr  Artist Pais  Medications Added to Medication List This Visit: 1)  Vitamin D3 50000 Units  .... One by mouth once weekly  Complete Medication List: 1)  Bystolic 5 Mg Tabs (Nebivolol hcl) .... One by mouth once daily 2)  Omeprazole 40 Mg Cpdr (Omeprazole) .... One by mouth two times a day 30 min before meals 3)  Flexeril 10 Mg Tabs (Cyclobenzaprine hcl) .... Take 1 tablet by mouth two times a day 4)  Dilaudid 4 Mg Tabs (Hydromorphone hcl) .... Take 1 tablet by mouth two times a day 5)  Bayer Low Strength 81 Mg Tbec (Aspirin) .... Take 1 tablet by mouth once a day 6)  Spiriva Handihaler 18 Mcg Caps (Tiotropium bromide monohydrate) .... Inhale contents of 1 capsule once a day 7)  Symbicort 160-4.5 Mcg/act Aero (Budesonide-formoterol fumarate) .... Inhale 2 puffs two times a day 8)  Caltrate 600+d Plus 600-400 Mg-unit Tabs (Calcium carbonate-vit d-min) .... One by mouth two times a day with meals 9)  Dilaudid Pump  .... Continuous 10)  Aerochamber Mv Misc (Spacer/aero-holding chambers) .... Use with symbicort 11)  Vitamin D3 50000 Units  .... One by mouth once weekly 12)  Ventolin Hfa 108 (90 Base) Mcg/act Aers (Albuterol sulfate) .... 2 puffs by mouth once daily as needed 13)  Pulse Oximeter  .... Use as needed  Patient Instructions: 1)  No change in medications 2)  Return in      4    months

## 2010-02-28 NOTE — Assessment & Plan Note (Signed)
Summary: Pulmonary OV   Primary Terrell Shimko/Referring Orlie Cundari:  [name] DO  CC:  1 month followup with cxr sob occasionally, cough  last few days with yellow frothy pt says using cough drops, and no wheezing.  History of Present Illness: 34 yowf  July 19, 2009 11:58 AM This pt was admitted 6/10 -6/13 for RLL CAP and colitis of sigmoid.  All c/s data was neg. Pt had AMS.  She responded to flagyl and levaquin. Last CXR showed no PNA and CT chest essentially neg. Echo normal.   The diarrhea is now resolved.  Pt notes now bowels are better,  no diarrhea,  no n/v  no abd pain,   Pt still with cough and tightness in chest , occ prod mucus yellow,  no real pndrip.  No chest pain. No edema in feet,  but did have edema before.  Still with chronic headache.  Head hurts across forehead and to the side of the head.  The pt remains on oxygen at bedtime along with symbicort and spiriva.  October 04, 2009 1:33 PM Pt notes since 6/11.  having sore throat  started two weeks ago.   recent CT chest.   no real fever.  The pt si  still with cough and is congested. The mucus is yellow and gray. There is  no real chest pain.  The pt  notes some headaches.   THe pt is not dyspneic.  There is  no real edema.  The Head aches on front and top The pt was ecent rx with  cleocin A recent CT  chest  noted with LL tree in bud pattern ?MAC . CT sinus was normal  November 01, 2009 2:23 PM Dyspnea is the same, not as bad.  Not able to raise the mucus.  No real chest pain.  No sore throat. No f/c/s.  Notes some headaches.  Mucus is yellow not a gray.  Took abx became white and foamy. at home pulse ox is 94%   Current Medications (verified): 1)  Bystolic 5 Mg Tabs (Nebivolol Hcl) .... One Half By Mouth Once Daily 2)  Omeprazole 40 Mg Cpdr (Omeprazole) .... One By Mouth Two Times A Day 30 Min Before Meals 3)  Flexeril 10 Mg Tabs (Cyclobenzaprine Hcl) .... Take 1 Tablet By Mouth Two Times A Day 4)  Dilaudid 4 Mg Tabs  (Hydromorphone Hcl) .... Take 1 Tablet By Mouth Once Daily 5)  Bayer Low Strength 81 Mg Tbec (Aspirin) .... Take 1 Tablet By Mouth Once A Day 6)  Spiriva Handihaler 18 Mcg Caps (Tiotropium Bromide Monohydrate) .... Inhale Contents of 1 Capsule Once A Day 7)  Symbicort 160-4.5 Mcg/act Aero (Budesonide-Formoterol Fumarate) .... Inhale 2 Puffs Two Times A Day 8)  Caltrate 600+d Plus 600-400 Mg-Unit Tabs (Calcium Carbonate-Vit D-Min) .... One By Mouth Two Times A Day With Meals 9)  Dilaudid Pump .... Continuous 10)  Aerochamber Mv  Misc (Spacer/aero-Holding Chambers) .... Use With Symbicort 11)  Vitamin D3 50000 Units .... One By Mouth Once Weekly 12)  Ventolin Hfa 108 (90 Base) Mcg/act Aers (Albuterol Sulfate) .... 2 Puffs By Mouth Once Daily As Needed 13)  Pulse Oximeter .... Use As Needed 14)  Majic Mouthwash .... Swish and Swollow 1-2 Teaspoonfuls Every 4 Hours As Directed 15)  Gabapentin 100 Mg Caps (Gabapentin) .... One By Mouth Three Times A Day 16)  Oxygen .... 2l At Night  Allergies (verified): 1)  ! Codeine 2)  ! Morphine  3)  ! * Reclast 4)  ! * Contrast Dye  Past History:  Past medical, surgical, family and social histories (including risk factors) reviewed, and no changes noted (except as noted below).  Past Medical History: Reviewed history from 09/28/2009 and no changes required. OSTEOPOROSIS -  Reclast rx 03/07/09 OSTEOARTHRITIS (ICD-715.90)  HIATAL HERNIA (ICD-553.3) GERD (ICD-530.81)   PNEUMONIA (ICD-486) COPD (ICD-496)  HYPOTENSION (ICD-458.9)  PEPTIC ULCER DISEASE (ICD-533.90) CARDIAC ARRHYTHMIA (ICD-427.9) HYPERCHOLESTEROLEMIA (ICD-272.0)  HEART FAILURE (ICD-428.9) HYPERTENSION (ICD-401.9) Hx of Spondylolysis with intractable back pain    Past Surgical History: Reviewed history from 09/28/2009 and no changes required. gallbladder - 2008  hysterectomy - 1970s back surgeries - 1970s, 1995, 2007  neck surgery - 1970s  spinal cord stimulator      Past  Pulmonary History:  Pulmonary History: Pulmonary Function Test  Date: 02/08/2009 Height (in.): 60 Gender: Female  Pre-Spirometry  FVC     Value: 1.48 L/min   Pred: 2.10 L/min     % Pred: 70 % FEV1     Value: 1.03 L     Pred: 1.38 L     % Pred: 74 % FEV1/FVC   Value: 69 %     Pred: 70 %     FEF 25-75   Value: 0.64 L/min   Pred: 1.72 L/min     % Pred: 37 %  Post-Spirometry  FVC     Value: 1.69 L/min   Pred: 2.10 L/min     % Pred: 80 % FEV1     Value: 1.25 L     Pred: 1.38 L     % Pred: 90 % FEV1/FVC   Value: 74 %     Pred: 70 %     FEF 25-75   Value: 0.89 L/min   Pred: 1.72 L/min     % Pred: 52 %  Lung Volumes  TLC     Value: 3.77 L   % Pred: 96 % RV     Value: 2.16 L   % Pred: 126 % DLCO     Value: 10.6 %   % Pred: 71 % DLCO/VA   Value: 3.31 %   % Pred: 101 %  Comments:  Moderate peripheral airflow obstruction ,  normal lung volumes,  mildly reduced DLCO  Family History: Reviewed history from 09/28/2009 and no changes required. heart disease - father cancer - mother [name] Family History Hypertension        Social History: Reviewed history from 09/28/2009 and no changes required. former smoker, quit in 1990, x81yrs, 1ppd. no alcohol widow    3 children   One grandchild at care link = Angelia lives with great-granddaughter  retired: worked for [employer]  Review of Systems       The patient complains of shortness of breath with activity.  The patient denies shortness of breath at rest, productive cough, non-productive cough, coughing up blood, chest pain, irregular heartbeats, acid heartburn, indigestion, loss of appetite, weight change, abdominal pain, difficulty swallowing, sore throat, tooth/dental problems, headaches, nasal congestion/difficulty breathing through nose, sneezing, itching, ear ache, anxiety, depression, hand/feet swelling, joint stiffness or pain, rash, change in color of mucus, and fever.    Vital Signs:  Patient profile:   77 year old  female Height:      62 inches Weight:      126 pounds O2 Sat:      93 % on Room air Temp:     98.3 degrees F oral Pulse  rate:   77 / minute BP sitting:   110 / 60  (right arm) Cuff size:   regular  Vitals Entered By: Kandice Hams CMA (November 01, 2009 1:54 PM)  O2 Flow:  Room air CC: 1 month followup with cxr sob occasionally, cough  last few days with yellow frothy pt says using cough drops,  no wheezing   Physical Exam  Additional Exam:  Gen: Pleasant, well-nourished, in no distress , normal affect ENT: no lesions, no post nasal drip Neck: No JVD, no TMG, no carotid bruits Lungs: No use of accessory muscles, no dullness to percussion,  distant BS, decreased  exp wheeze RLL and LLL Cardiovascular: RRR, heart sounds normal, no murmurs or gallop, trace pretibial edema bilaterally Abdomen: soft and non-tender, no HSM, BS normal Musculoskeletal: No deformities, no cyanosis or clubbing Neuro: alert, non-focal     CXR  Procedure date:  11/01/2009  Findings:      IMPRESSION: Mild emphysematous and bronchitic changes. No acute abnormalities.     Impression & Recommendations:  Problem # 1:  COPD (ICD-496) Assessment Improved  copd with recent tree in bud pattern LLL on Ct chest ? mac infection.  also poss chronic aspiration .  Repeat CXR 10/6 normal pt with better hfa technique plan  flu vaccine flutter valve cont laba/ics inhaled meds no need for fob  Medications Added to Medication List This Visit: 1)  Dilaudid 4 Mg Tabs (Hydromorphone hcl) .... Take 1 tablet by mouth once daily 2)  Oxygen  .... 2l at night 3)  Flutter Devi (Respiratory therapy supplies) .... Use 4 times daily  Complete Medication List: 1)  Bystolic 5 Mg Tabs (Nebivolol hcl) .... One half by mouth once daily 2)  Omeprazole 40 Mg Cpdr (Omeprazole) .... One by mouth two times a day 30 min before meals 3)  Flexeril 10 Mg Tabs (Cyclobenzaprine hcl) .... Take 1 tablet by mouth two times a day 4)   Dilaudid 4 Mg Tabs (Hydromorphone hcl) .... Take 1 tablet by mouth once daily 5)  Bayer Low Strength 81 Mg Tbec (Aspirin) .... Take 1 tablet by mouth once a day 6)  Spiriva Handihaler 18 Mcg Caps (Tiotropium bromide monohydrate) .... Inhale contents of 1 capsule once a day 7)  Symbicort 160-4.5 Mcg/act Aero (Budesonide-formoterol fumarate) .... Inhale 2 puffs two times a day 8)  Caltrate 600+d Plus 600-400 Mg-unit Tabs (Calcium carbonate-vit d-min) .... One by mouth two times a day with meals 9)  Dilaudid Pump  .... Continuous 10)  Aerochamber Mv Misc (Spacer/aero-holding chambers) .... Use with symbicort 11)  Vitamin D3 50000 Units  .... One by mouth once weekly 12)  Ventolin Hfa 108 (90 Base) Mcg/act Aers (Albuterol sulfate) .... 2 puffs by mouth once daily as needed 13)  Pulse Oximeter  .... Use as needed 14)  Majic Mouthwash  .... Swish and swollow 1-2 teaspoonfuls every 4 hours as directed 15)  Gabapentin 100 Mg Caps (Gabapentin) .... One by mouth three times a day 16)  Oxygen  .... 2l at night 17)  Flutter Devi (Respiratory therapy supplies) .... Use 4 times daily  Other Orders: Est. Patient Level IV (45409) Flutter Valve (81191) Flu Vaccine 49yrs + MEDICARE PATIENTS (Y7829) Administration Flu vaccine - MCR (F6213) Flu Vaccine Consent Questions     Do you have a history of severe allergic reactions to this vaccine? no    Any prior history of allergic reactions to egg and/or gelatin? no    Do you  have a sensitivity to the preservative Thimersol? no    Do you have a past history of Guillan-Barre Syndrome? no    Do you currently have an acute febrile illness? no    Have you ever had a severe reaction to latex? no    Vaccine information given and explained to patient? yes    Are you currently pregnant? no    Lot Number:[id number]   Exp Date:07/27/2010   Site Given  Left Deltoid IM .Kandice Hams [facility]  November 01, 2009 2:44 PM  Flu Vaccine 73yrs + MEDICARE PATIENTS  [phone]) Administration Flu vaccine - MCR [phone]) .lbmedflu  Patient Instructions: 1)  Flu vaccine 2)  Flutter valve 4 times daily 3)  Stay on inhalers 4)  Return 6 weeks [place] Prescriptions: FLUTTER  DEVI (RESPIRATORY THERAPY SUPPLIES) Use 4 times daily  #1 x 0   Entered and Authorized by:   Storm Frisk MD   Signed by:   Storm Frisk MD on 11/01/2009   Method used:   Print then Give to Patient   RxID:   [id number]

## 2010-02-28 NOTE — Progress Notes (Signed)
Summary: nos appt  Phone Note Call from Patient   Caller: [email or web address]  Call For: wright Summary of Call: Rsc nos from 11/17 to 12/8. Initial call taken by: Darletta Moll,  December 14, 2009 11:04 AM

## 2010-02-28 NOTE — Miscellaneous (Signed)
Summary: CXR order  Clinical Lists Changes  Orders: Added new Test order of T-2 View CXR (71020TC) - Signed 

## 2010-02-28 NOTE — Miscellaneous (Signed)
Summary: [facility]  Care Plan/[facility]   Imported By: Lanelle Bal 08/06/2009 13:43:25  _____________________________________________________________________  External Attachment:    Type:   Image     Comment:   External Document

## 2010-02-28 NOTE — Assessment & Plan Note (Signed)
Summary: 1 month follow up/mhf   Vital Signs:  Patient profile:   77 year old female Weight:      127 pounds BMI:     23.31 O2 Sat:      96 % on Room air Temp:     97.9 degrees F oral Pulse rate:   76 / minute Pulse rhythm:   regular Resp:     26 per minute BP sitting:   110 / 70  (left arm) Cuff size:   regular  Vitals Entered By: Glendell Docker CMA (Jun 19, 2009 4:14 PM)  O2 Flow:  Room air CC: Rm 2- 1 Month Follow up    Primary Care Provider:  [name] DO  CC:  Rm 2- 1 Month Follow up .  History of Present Illness: 77 y/o white female for f/u Lower ext swelling much better.  still gets swelling at end of day  she reports intermittent substernal chest pain symptoms are non exertional.  she is followed by Dr. Patty Sermons cardiologist evaluated pt in the past for volume overload she has had stress test in the past - reported normal she is not sure if she has had 2 D Echo  she is feeling stronger.  off oxygen   Allergies: 1)  ! Codeine 2)  ! Morphine 3)  ! * Reclast 4)  ! * Contrast Dye  Past History:  Past Medical History: OSTEOPOROSIS -  Reclast rx 03/07/09 OSTEOARTHRITIS (ICD-715.90) HIATAL HERNIA (ICD-553.3) GERD (ICD-530.81)  PNEUMONIA (ICD-486) COPD (ICD-496)  HYPOTENSION (ICD-458.9)  PEPTIC ULCER DISEASE (ICD-533.90) CARDIAC ARRHYTHMIA (ICD-427.9) HYPERCHOLESTEROLEMIA (ICD-272.0) HEART FAILURE (ICD-428.9) HYPERTENSION (ICD-401.9) Hx of Spondylolysis with intractable back pain    Past Surgical History: gallbladder - 2008  hysterectomy - 1970s back surgeries - 1970s, 1995, 2007 neck surgery - 1970s spinal cord stimulator     Social History: former smoker, quit in 1990, x59yrs, 1ppd. no alcohol widow   3 children One grandchild at care link = Angelia lives with great-granddaughter  retired: worked for [employer]  Physical Exam  General:  alert, well-developed, and well-nourished.   Chest Wall:  no chest wall tenderness Lungs:   dry bibasilar crackles, normal respiratory effort and normal breath sounds.   Heart:  normal rate, regular rhythm, no murmur, and no gallop.   Extremities:  trace left pedal edema and trace right pedal edema.     Impression & Recommendations:  Problem # 1:  CHEST PAIN, ATYPICAL (ICD-786.59) non exertional chest pain.  increase PPI f/u with cardiologist if symptoms get worse  Problem # 2:  HYPERTENSION (ICD-401.9) stable.  Maintain current medication regimen.  Her updated medication list for this problem includes:    Bystolic 5 Mg Tabs (Nebivolol hcl) ..... One by mouth once daily  BP today: 110/70 Prior BP: 120/76 (05/10/2009)  Labs Reviewed: K+: 4.6 (04/09/2009) Creat: : 0.78 (04/09/2009)   Chol: 151 (04/09/2009)   HDL: 39 (04/09/2009)   LDL: 87 (04/09/2009)   TG: 125 (04/09/2009)  Complete Medication List: 1)  Simvastatin 20 Mg Tabs (Simvastatin) .... Take 1 tab by mouth at bedtime 2)  Bystolic 5 Mg Tabs (Nebivolol hcl) .... One by mouth once daily 3)  Omeprazole 40 Mg Cpdr (Omeprazole) .... One by mouth two times a day 30 min before meals 4)  Flexeril 10 Mg Tabs (Cyclobenzaprine hcl) .... Take 1 tablet by mouth two times a day 5)  Dilaudid 4 Mg Tabs (Hydromorphone hcl) .... Take 1 tablet by mouth two times a  day 6)  Bayer Low Strength 81 Mg Tbec (Aspirin) .... Take 1 tablet by mouth once a day 7)  Spiriva Handihaler 18 Mcg Caps (Tiotropium bromide monohydrate) .... Inhale contents of 1 capsule once a day 8)  Symbicort 160-4.5 Mcg/act Aero (Budesonide-formoterol fumarate) .... Inhale 2 puffs two times a day 9)  Caltrate 600+d Plus 600-400 Mg-unit Tabs (Calcium carbonate-vit d-min) .... One by mouth two times a day with meals 10)  Dilaudid Pump  .... Continuous 11)  Aerochamber Mv Misc (Spacer/aero-holding chambers) .... Use with symbicort 12)  Vitamin D3 1000 Unit Caps (Cholecalciferol) .... One by mouth once daily 13)  Ventolin Hfa 108 (90 Base) Mcg/act Aers (Albuterol  sulfate) .... 2 puffs by mouth once daily as needed 14)  Pulse Oximeter  .... Use as needed  Patient Instructions: 1)  Please follow up with Dr. Patty Sermons re:  chest pain. 2)  Please schedule a follow-up appointment in 6 months. Prescriptions: OMEPRAZOLE 40 MG CPDR (OMEPRAZOLE) one by mouth two times a day 30 min before meals  #60 x 5   Entered and Authorized by:   D. Thomos Lemons DO   Signed by:   D. Thomos Lemons DO on 06/19/2009   Method used:   Electronically to        [facility] (retail)       [id number]       [place], [place]  [id number]       Ph: [phone]       Fax: [phone]   RxID:   [id number]   Current Allergies (reviewed today): ! CODEINE ! MORPHINE ! * RECLAST ! * CONTRAST DYE

## 2010-02-28 NOTE — Progress Notes (Signed)
Summary: Bystolic Refill  Phone Note Refill Request Message from:  Fax from Pharmacy on November 14, 2009 3:42 PM  Refills Requested: Medication #1:  BYSTOLIC 5 MG TABS one half by mouth once daily   Dosage confirmed as above?Dosage Confirmed   Brand Name Necessary? No   Supply Requested: 1 month   Last Refilled: 09/06/2009  Method Requested: Electronic Next Appointment Scheduled: No future appointments on  file with Dr Artist Pais Initial call taken by: Glendell Docker CMA,  November 14, 2009 3:43 PM  Follow-up for Phone Call        Rx completed in Dr. Tiajuana Amass Follow-up by: Glendell Docker CMA,  November 14, 2009 3:45 PM    Prescriptions: BYSTOLIC 5 MG TABS (NEBIVOLOL HCL) one half by mouth once daily  #30 x 3   Entered by:   Glendell Docker CMA   Authorized by:   D. Thomos Lemons DO   Signed by:   Glendell Docker CMA on 11/14/2009   Method used:   Electronically to        [facility] (retail)       [id number]       [place], [place]  [id number]       Ph: [phone]       Fax: [phone]   RxID:   [id number]

## 2010-02-28 NOTE — Progress Notes (Signed)
Summary: status update, O2 clarification  Phone Note Other Incoming   Caller: Angela (nurse) w/ [facility]  [phone] Summary of Call: Received voicemail from nurse stating that she saw pt. today and pt still has coarse crackles in right and left lobes, still has small amount of yellow sputum.   O2 sat was 95% on room air. Is pt's oxygen supposed to be continuous or as needed. Please advise.  Nicki Guadalajara Fergerson CMA Duncan Dull)  July 27, 2009 4:24 PM   Follow-up for Phone Call        use oxygen as needed Follow-up by: D. Thomos Lemons DO,  July 27, 2009 4:29 PM  Additional Follow-up for Phone Call Additional follow up Details #1::        Left message on voicemail informing Marylene Land of Dr. Olegario Messier instructions and to call if she has any questions.  Nicki Guadalajara Fergerson CMA Duncan Dull)  July 27, 2009 4:35 PM

## 2010-02-28 NOTE — Assessment & Plan Note (Signed)
Summary: NP follow up - discuss reclast   Primary Provider/Referring Provider:  [name] DO  CC:  OV to discuss reclast.  c/o still SOB with activity or simply changing tanks.  History of Present Illness: 77 yo female with known hx COPD.    02/01/2009-- Presents for post hosp follow up. Admitted 12/25-12/27/10 for COPD exacerbation. She has a hx of  degenerative disk disease on  chronic Dilaudid was brought in by her family for increased shortness of  breath.  She was diagnosed with a COPD exacerbation and right lower lobe   pneumonia. Concern for probably  episode of  aspiration.  She was tx w/ aggressive pulmonary hygiene and IV abx . Improved w/ transition over to oral/inhaler meds at discharge. She was last seen 2007 by Dr. Sherene Sires for her COPD managment. She has plans to establish with Dr. Artist Pais and would like to be seen at our [facility] office. Since discharge she is slowly improving, continues to get out of breath quite easily. She is currently on Symbicort 160 and Spiriva. Denies chest pain,  orthopnea, hemoptysis, fever, n/v/d, edema, headache.  February 22, 2009 10:41 AM Not as good as the 02/01/09.  Pt notes lips turning blue.  Pt is more dyspneic.  More cough but is dry.  Not on oxygen at home.  No chest pain.  Now on neb plus symbicort plus spiriva   March 01, 2009 10:42 AM At last ov on 1/27 we rx: Start oxygen 2Liter rest, 3Liter exertion obtain today from [facility] main office Start Bystolic 10mg  (2 - 5mg  tab or 1 - 10mg  tab) daily Stop Metoprolol Stop duoneb Stay on Symbicort and Spiriva daily Prednisone 10mg  4 each am x3days, 3 x 3days, 2 x 3days, 1 x 3days then stop Still dyspneic with any exertion but is better compared to 1/27 ov.  There is less cough ,  dyspnea is better.  Pt now on oxygen day and night.  There is no chest pain. There is no wheeze.    Pt denies any significant sore throat, nasal congestion or excess secretions, fever, chills, sweats, unintended  weight loss, pleurtic or exertional chest pain, orthopnea PND, or leg swelling Pt denies any increase in rescue therapy over baseline, denies waking up needing it or having any early am or nocturnal exacerbations of coughing/wheezing/or dyspnea.   March 15, 2009 --Presents for follow up to discuss reclast. She has hx of osteoporosis .w/ hx of steroid exposure d/t  COPD flares. She has been intolerant to oral boniva and fosamax. She would like to discuss reclast. She is here today to set up infusion. she has no hx of fx.  She is currently on caltrate two times a day.  Denies chest pain, dyspnea, orthopnea, hemoptysis, fever, n/v/d, edema, headache, known kidney dz.   Medications Prior to Update: 1)  Simvastatin 20 Mg Tabs (Simvastatin) .... Take 1 Tab By Mouth At Bedtime 2)  Bystolic 10 Mg  Tabs (Nebivolol Hcl) .... One Tablet Daily 3)  Prilosec 20 Mg Cpdr (Omeprazole) .... Take 1 Capsule By Mouth Two Times A Day 4)  Flexeril 10 Mg Tabs (Cyclobenzaprine Hcl) .... Take 1 Tablet By Mouth Two Times A Day 5)  Dilaudid 4 Mg Tabs (Hydromorphone Hcl) .... Take 1 Tablet By Mouth Two Times A Day 6)  Bayer Low Strength 81 Mg Tbec (Aspirin) .... Take 1 Tablet By Mouth Once A Day 7)  Spiriva Handihaler 18 Mcg Caps (Tiotropium Bromide Monohydrate) .... Inhale  Contents of 1 Capsule Once A Day 8)  Symbicort 160-4.5 Mcg/act Aero (Budesonide-Formoterol Fumarate) .... Inhale 2 Puffs Two Times A Day 9)  Caltrate 600+d Plus 600-400 Mg-Unit Tabs (Calcium Carbonate-Vit D-Min) .... One By Mouth Two Times A Day With Meals 10)  Dilaudid Pump .... Continuous 11)  Aerochamber Mv  Misc (Spacer/aero-Holding Chambers) .... Use With Symbicort 12)  Magic Mouthwash .... Swish and Swallow 1-2 Tsp Every 4 Hours As Needed  Current Medications (verified): 1)  Simvastatin 20 Mg Tabs (Simvastatin) .... Take 1 Tab By Mouth At Bedtime 2)  Bystolic 10 Mg  Tabs (Nebivolol Hcl) .... One Tablet Daily 3)  Prilosec 20 Mg Cpdr  (Omeprazole) .... Take 1 Capsule By Mouth Two Times A Day 4)  Flexeril 10 Mg Tabs (Cyclobenzaprine Hcl) .... Take 1 Tablet By Mouth Two Times A Day 5)  Dilaudid 4 Mg Tabs (Hydromorphone Hcl) .... Take 1 Tablet By Mouth Two Times A Day 6)  Bayer Low Strength 81 Mg Tbec (Aspirin) .... Take 1 Tablet By Mouth Once A Day 7)  Spiriva Handihaler 18 Mcg Caps (Tiotropium Bromide Monohydrate) .... Inhale Contents of 1 Capsule Once A Day 8)  Symbicort 160-4.5 Mcg/act Aero (Budesonide-Formoterol Fumarate) .... Inhale 2 Puffs Two Times A Day 9)  Caltrate 600+d Plus 600-400 Mg-Unit Tabs (Calcium Carbonate-Vit D-Min) .... One By Mouth Two Times A Day With Meals 10)  Dilaudid Pump .... Continuous 11)  Aerochamber Mv  Misc (Spacer/aero-Holding Chambers) .... Use With Symbicort 12)  Magic Mouthwash .... Swish and Swallow 1-2 Tsp Every 4 Hours As Needed  Allergies (verified): 1)  ! Codeine 2)  ! Morphine 3)  ! * Contrast Dye  Past History:  Past Surgical History: Last updated: 02/12/2009 gallbladder - 2008  hysterectomy - 1970s back surgeries - 1970s, 1995, 2007 neck surgery - 1970s spinal cord stimulator  Family History: Last updated: 02/12/2009 heart disease - father cancer - mother [name] Family History Hypertension    Social History: Last updated: 02/12/2009 former smoker, quit in 1990, x72yrs, 1ppd. no alcohol widow  3 children lives with great-granddaughter  retired: worked for [employer]  Risk Factors: Smoking Status: quit > 6 months (03/01/2009)  Past Medical History: OSTEOPOROSIS --reclast paperwork March 15, 2009 (intolerant to oral bisophosphanates. )  OSTEOARTHRITIS (ICD-715.90) HIATAL HERNIA (ICD-553.3) GERD (ICD-530.81) PNEUMONIA (ICD-486) COPD (ICD-496) HYPOTENSION (ICD-458.9)  PEPTIC ULCER DISEASE (ICD-533.90) CARDIAC ARRHYTHMIA (ICD-427.9) HYPERCHOLESTEROLEMIA (ICD-272.0) HEART FAILURE (ICD-428.9) HYPERTENSION (ICD-401.9) Hx of Spondylolysis with  intractable back pain    Review of Systems      See HPI  Vital Signs:  Patient profile:   77 year old female Height:      62 inches Weight:      133 pounds BMI:     24.41 O2 Sat:      96 % on 3L cont Temp:     98.4 degrees F oral Pulse rate:   87 / minute BP sitting:   102 / 64  (right arm) Cuff size:   regular  Vitals Entered By: Boone Master CNA (March 15, 2009 3:23 PM)  O2 Flow:  3L cont CC: OV to discuss reclast.  c/o still SOB with activity or simply changing tanks Is Patient Diabetic? No Comments Medications reviewed with patient Daytime contact number verified with patient. Boone Master CNA  March 15, 2009 3:23 PM    Physical Exam  Additional Exam:  Gen: Pleasant, well-nourished, in no distress , normal affect ENT: no lesions, no post nasal drip Neck:  No JVD, no TMG, no carotid bruits Lungs: No use of accessory muscles, no dullness to percussion, inspir /expir wheezes, distant bs  Cardiovascular: RRR, heart sounds normal, no murmurs or gallops, no peripheral edema Abdomen: soft and non-tender, no HSM, BS normal Musculoskeletal: No deformities, no cyanosis or clubbing Neuro: alert, non-focal     Impression & Recommendations:  Problem # 1:  OSTEOPOROSIS (ICD-733.00) Pt would be good candidate for Reclast.  check labs w/ bmet and vitamin d  cont on caltrate two times a day , add vitamin d 1000 international units once daily  weight bearing exercises   Complete Medication List: 1)  Simvastatin 20 Mg Tabs (Simvastatin) .... Take 1 tab by mouth at bedtime 2)  Bystolic 10 Mg Tabs (Nebivolol hcl) .... One tablet daily 3)  Prilosec 20 Mg Cpdr (Omeprazole) .... Take 1 capsule by mouth two times a day 4)  Flexeril 10 Mg Tabs (Cyclobenzaprine hcl) .... Take 1 tablet by mouth two times a day 5)  Dilaudid 4 Mg Tabs (Hydromorphone hcl) .... Take 1 tablet by mouth two times a day 6)  Bayer Low Strength 81 Mg Tbec (Aspirin) .... Take 1 tablet by mouth once a  day 7)  Spiriva Handihaler 18 Mcg Caps (Tiotropium bromide monohydrate) .... Inhale contents of 1 capsule once a day 8)  Symbicort 160-4.5 Mcg/act Aero (Budesonide-formoterol fumarate) .... Inhale 2 puffs two times a day 9)  Caltrate 600+d Plus 600-400 Mg-unit Tabs (Calcium carbonate-vit d-min) .... One by mouth two times a day with meals 10)  Dilaudid Pump  .... Continuous 11)  Aerochamber Mv Misc (Spacer/aero-holding chambers) .... Use with symbicort 12)  Magic Mouthwash  .... Swish and swallow 1-2 tsp every 4 hours as needed  Other Orders: TLB-BMP (Basic Metabolic Panel-BMET) (80048-METABOL) T-Vitamin D (25-Hydroxy) (81191-47829) Misc. Referral (Misc. Ref) Est. Patient Level III (56213)  Patient Instructions: 1)  Calcium with vitamin d two times a day  2)  Add Vitamin D3  1000 International Units once daily  3)  I will call with labs  4)  We are setting you up for Reclast infusion for your bones.  5)  follow up Dr. Delford Field in 2 months in [facility]

## 2010-02-28 NOTE — Progress Notes (Signed)
Summary: Bruise & scab on leg  Phone Note From Other Clinic Call back at [phone]   Caller: Nurse Summary of Call: SW Marylene Land, states pt has bruise and scab on left lower leg, no drainage, pt has been putting neosporin on it, pls examine at OV today Initial call taken by: Lannette Donath,  August 17, 2009 11:38 AM

## 2010-02-28 NOTE — Progress Notes (Signed)
Summary: Home health nurse called with medical question  Phone Note Other Incoming   Caller: debbie, [facility] Summary of Call: Pt. was in hospital with Pnuemonia & COPD.... At home now Heart rate is 120 rest, and 140 -ambulating . Please call nurse back [phone] Initial call taken by: Michaelle Copas,  January 23, 2009 4:32 PM  Follow-up for Phone Call        Called the nurse and explained that she has not been seen by Dr Artist Pais.  Pt has appt on 02/06/09 to est with Dr Artist Pais but still can't give any medical advice without knowing medical history.  Scherrie Merritts, RN to call Dr Sherene Sires and gave her his phone number Follow-up by: Alfred Levins, CMA,  January 23, 2009 5:19 PM

## 2010-02-28 NOTE — Assessment & Plan Note (Signed)
Summary: Pulmonary OV   Primary Provider/Referring Provider:  [name] DO  CC:  2 wk follow up.  SOB with exertion but overall breathing has improved.  Coughing some - prod at times with yellowish white mucus.  Denies wheezing and chest tightness. [name]  History of Present Illness: 77 yowf  July 19, 2009 11:58 AM This pt was admitted 6/10 -6/13 for RLL CAP and colitis of sigmoid.  All c/s data was neg. Pt had AMS.  She responded to flagyl and levaquin. Last CXR showed no PNA and CT chest essentially neg. Echo normal.   The diarrhea is now resolved.  Pt notes now bowels are better,  no diarrhea,  no n/v  no abd pain,   Pt still with cough and tightness in chest , occ prod mucus yellow,  no real pndrip.  No chest pain. No edema in feet,  but did have edema before.  Still with chronic headache.  Head hurts across forehead and to the side of the head.  The pt remains on oxygen at bedtime along with symbicort and spiriva.  October 04, 2009 1:33 PM Pt notes since 6/11.  having sore throat  started two weeks ago.   recent CT chest.   no real fever.  The pt si  still with cough and is congested. The mucus is yellow and gray. There is  no real chest pain.  The pt  notes some headaches.   THe pt is not dyspneic.  There is  no real edema.  The Head aches on front and top The pt was ecent rx with  cleocin A recent CT  chest  noted with LL tree in bud pattern ?MAC . CT sinus was normal  November 01, 2009 2:23 PM Dyspnea is the same, not as bad.  Not able to raise the mucus.  No real chest pain.  No sore throat. No f/c/s.  Notes some headaches.  Mucus is yellow not a gray.  Took abx became white and foamy. at home pulse ox is 94% January 03, 2010 11:50 AM Pt in hosp 11/6 - 12/07/09 for pancreatitis since d/c no cough,  no pain now.  occ pain in LUQ  but is better.   Notes more congestion and more dyspneic at night.  Occ heartburn with this .  Sleeps on two pillows. Bed is raised 6 inches. Has  chronic headaches and to see neurology (2/12)  February 07, 2010 2:28 PM dx diarrhea and abd pain.  dx colitis ?etiol pt admitted for same  d/c on hyosciamine for nausea and abd pain LFTs higher dyspnea is worse than before .  notes some cough but something in the throat mucus off white no real chest pain. has indigestion no further diarrhea still with abd pain   February 21, 2010 2:03 PM at last ov we rec: rx flagyl and cipro to cover GI and lungs cough is less,  flutter valve helps.   GI MD saw the pt and shows stones in the biliary tree.  GB out for three years.  Problem since that time is now off all abx.   no chest pain.  low grade fever.  diarrhea is better.  no n/v  to have ercp for stones on 03/01/10   Current Medications (verified): 1)  Bystolic 5 Mg Tabs (Nebivolol Hcl) .... One Half By Mouth Once Daily 2)  Omeprazole 40 Mg Cpdr (Omeprazole) .... One By Mouth Two Times A Day 30 Min  Before Meals 3)  Bayer Low Strength 81 Mg Tbec (Aspirin) .... Take 1 Tablet By Mouth Once A Day 4)  Spiriva Handihaler 18 Mcg Caps (Tiotropium Bromide Monohydrate) .... Inhale Contents of 1 Capsule Once A Day 5)  Symbicort 160-4.5 Mcg/act Aero (Budesonide-Formoterol Fumarate) .... Inhale 2 Puffs Two Times A Day 6)  Caltrate 600+d Plus 600-400 Mg-Unit Tabs (Calcium Carbonate-Vit D-Min) .... One By Mouth Two Times A Day With Meals 7)  Dilaudid Pump .... Continuous 8)  Aerochamber Mv  Misc (Spacer/aero-Holding Chambers) .... Use With Symbicort 9)  Ventolin Hfa 108 (90 Base) Mcg/act Aers (Albuterol Sulfate) .[name].. 1 Puff By Mouth Three Times A Day As Needed 10)  Gabapentin 100 Mg Caps (Gabapentin) .... One By Mouth Three Times A Day 11)  Oxygen .... 2l At Night 12)  Flutter  Devi (Respiratory Therapy Supplies) .... Use 4 Times Daily 13)  Vitamin D (Ergocalciferol) 50000 Unit Caps (Ergocalciferol) .... One Tablet By Mouth Once Weekly 14)  Tandem 162-115.2 Mg Caps (Ferrous Fum-Iron Polysacch) .... Take 1  Capsule By Mouth Once A Day 15)  Fioricet 50-325-40 Mg Tabs (Butalbital-Apap-Caffeine) .... One Tablet By Mouth  Every 8 Hours As Needed Headache 16)  Vitamin C 500 Mg Tabs (Ascorbic Acid) .... Take 1 Tablet By Mouth Once A Day 17)  Hyoscyamine Sulfate 0.125 Mg Tabs (Hyoscyamine Sulfate) .... Take 1 Tablet By Mouth Once A Day As Needed 18)  Dilaudid 2 Mg Tabs (Hydromorphone Hcl) .... As Needed  Allergies (verified): 1)  ! Codeine 2)  ! Morphine 3)  ! * Reclast 4)  ! * Contrast Dye  Past History:  Past medical, surgical, family and social histories (including risk factors) reviewed, and no changes noted (except as noted below).  Past Medical History: Reviewed history from 02/05/2010 and no changes required. OSTEOPOROSIS -  Reclast rx 03/07/09 OSTEOARTHRITIS (ICD-715.90)   HIATAL HERNIA (ICD-553.3) GERD (ICD-530.81)    PNEUMONIA (ICD-486) COPD (ICD-496)  HYPOTENSION (ICD-458.9)  PEPTIC ULCER DISEASE (ICD-533.90) CARDIAC ARRHYTHMIA (ICD-427.9) HYPERCHOLESTEROLEMIA (ICD-272.0)  HEART FAILURE (ICD-428.9) HYPERTENSION (ICD-401.9) Hx of Spondylolysis with intractable back pain    Past Surgical History: Reviewed history from 02/05/2010 and no changes required. gallbladder - 2008  hysterectomy - 1970s back surgeries - 1970s, 1995, 2007  neck surgery - 1970s  spinal cord stimulator       Past Pulmonary History:  Pulmonary History: Pulmonary Function Test  Date: 02/08/2009 Height (in.): 60 Gender: Female  Pre-Spirometry  FVC     Value: 1.48 L/min   Pred: 2.10 L/min     % Pred: 70 % FEV1     Value: 1.03 L     Pred: 1.38 L     % Pred: 74 % FEV1/FVC   Value: 69 %     Pred: 70 %     FEF 25-75   Value: 0.64 L/min   Pred: 1.72 L/min     % Pred: 37 %  Post-Spirometry  FVC     Value: 1.69 L/min   Pred: 2.10 L/min     % Pred: 80 % FEV1     Value: 1.25 L     Pred: 1.38 L     % Pred: 90 % FEV1/FVC   Value: 74 %     Pred: 70 %     FEF 25-75   Value: 0.89 L/min   Pred: 1.72 L/min      % Pred: 52 %  Lung Volumes  TLC     Value: 3.77 L   %  Pred: 96 % RV     Value: 2.16 L   % Pred: 126 % DLCO     Value: 10.6 %   % Pred: 71 % DLCO/VA   Value: 3.31 %   % Pred: 101 %  Comments:  Moderate peripheral airflow obstruction ,  normal lung volumes,  mildly reduced DLCO  Family History: Reviewed history from 02/05/2010 and no changes required. heart disease - father cancer - mother [name] Family History Hypertension          Social History: Reviewed history from 02/05/2010 and no changes required. former smoker, quit in 1990, x4yrs, 1ppd. no alcohol widow    3 children   One grandchild at care link = Angelia lives with great-granddaughter    retired: worked for [employer]  Review of Systems       The patient complains of shortness of breath with activity.  The patient denies shortness of breath at rest, productive cough, non-productive cough, coughing up blood, chest pain, irregular heartbeats, acid heartburn, indigestion, loss of appetite, weight change, abdominal pain, difficulty swallowing, sore throat, tooth/dental problems, headaches, nasal congestion/difficulty breathing through nose, sneezing, itching, ear ache, anxiety, depression, hand/feet swelling, joint stiffness or pain, rash, change in color of mucus, and fever.    Vital Signs:  Patient profile:   77 year old female Height:      61 inches Weight:      125 pounds BMI:     23.70 O2 Sat:      94 % on Room air Temp:     98.4 degrees F oral Pulse rate:   75 / minute BP sitting:   110 / 62  (right arm) Cuff size:   regular  Vitals Entered By: Gweneth Dimitri RN (February 21, 2010 1:58 PM)  O2 Flow:  Room air CC: 2 wk follow up.  SOB with exertion but overall breathing has improved.  Coughing some - prod at times with yellowish white mucus.  Denies wheezing and chest tightness.  Comments Medications reviewed with patient Daytime contact number verified with patient. Gweneth Dimitri RN  February 21, 2010  1:58 PM    Physical Exam  Additional Exam:  Gen: Pleasant, well-nourished, in no distress , normal affect ENT: no lesions, no post nasal drip Neck: No JVD, no TMG, no carotid bruits Lungs: No use of accessory muscles, no dullness to percussion,  distant BS, decreased  exp wheeze RLL and LLL Cardiovascular: RRR, heart sounds normal, no murmurs or gallop, trace pretibial edema bilaterally Abdomen: diffusely tender, bsa, no masses or hsm Musculoskeletal: No deformities, no cyanosis or clubbing Neuro: alert, non-focal     Impression & Recommendations:  Problem # 1:  BRONCHITIS (ICD-490) Assessment Improved improved tracheobronchitis, stable copd plan No change in inhaled medications.   Maintain treatment program as currently prescribed.  Her updated medication list for this problem includes:    Spiriva Handihaler 18 Mcg Caps (Tiotropium bromide monohydrate) ..... Inhale contents of 1 capsule once a day    Symbicort 160-4.5 Mcg/act Aero (Budesonide-formoterol fumarate) ..... Inhale 2 puffs two times a day    Ventolin Hfa 108 (90 Base) Mcg/act Aers (Albuterol sulfate) .[name]... 1 puff by mouth three times a day as needed  Orders: Est. Patient Level III (11914)  Medications Added to Medication List This Visit: 1)  Dilaudid 2 Mg Tabs (Hydromorphone hcl) .... As needed  Complete Medication List: 1)  Bystolic 5 Mg Tabs (Nebivolol hcl) .... One half by mouth once daily 2)  Omeprazole 40 Mg Cpdr (Omeprazole) .... One by mouth two times a day 30 min before meals 3)  Bayer Low Strength 81 Mg Tbec (Aspirin) .... Take 1 tablet by mouth once a day 4)  Spiriva Handihaler 18 Mcg Caps (Tiotropium bromide monohydrate) .... Inhale contents of 1 capsule once a day 5)  Symbicort 160-4.5 Mcg/act Aero (Budesonide-formoterol fumarate) .... Inhale 2 puffs two times a day 6)  Caltrate 600+d Plus 600-400 Mg-unit Tabs (Calcium carbonate-vit d-min) .... One by mouth two times a day with meals 7)  Dilaudid  Pump  .... Continuous 8)  Aerochamber Mv Misc (Spacer/aero-holding chambers) .... Use with symbicort 9)  Ventolin Hfa 108 (90 Base) Mcg/act Aers (Albuterol sulfate) .[name].. 1 puff by mouth three times a day as needed 10)  Gabapentin 100 Mg Caps (Gabapentin) .... One by mouth three times a day 11)  Oxygen  .... 2l at night 12)  Flutter Devi (Respiratory therapy supplies) .... Use 4 times daily 13)  Vitamin D (ergocalciferol) 50000 Unit Caps (Ergocalciferol) .... One tablet by mouth once weekly 14)  Tandem 162-115.2 Mg Caps (Ferrous fum-iron polysacch) .... Take 1 capsule by mouth once a day 15)  Fioricet 50-325-40 Mg Tabs (Butalbital-apap-caffeine) .... One tablet by mouth  every 8 hours as needed headache 16)  Vitamin C 500 Mg Tabs (Ascorbic acid) .... Take 1 tablet by mouth once a day 17)  Hyoscyamine Sulfate 0.125 Mg Tabs (Hyoscyamine sulfate) .... Take 1 tablet by mouth once a day as needed 18)  Dilaudid 2 Mg Tabs (Hydromorphone hcl) .... As needed  Patient Instructions: 1)  No change in medications 2)  Return in     2 months

## 2010-02-28 NOTE — Miscellaneous (Signed)
Summary: Care Plan & ST Orders/[facility]  Care Plan & ST Orders/[facility]   Imported By: Lanelle Bal 04/16/2009 09:51:21  _____________________________________________________________________  External Attachment:    Type:   Image     Comment:   External Document

## 2010-02-28 NOTE — Assessment & Plan Note (Signed)
Summary: Pulmonary OV   Primary Provider/Referring Provider:  [name] DO  CC:  1 wk follow up.  states she is feeling a little better.  still having SOB when "in a hurry" and still having a "raspy" cough.  c/o sores in mouth x2days.[name]  History of Present Illness: 77 yo female with known hx COPD.    02/01/2009-- Presents for post hosp follow up. Admitted 12/25-12/27/10 for COPD exacerbation. She has a hx of  degenerative disk disease on  chronic Dilaudid was brought in by her family for increased shortness of  breath.  She was diagnosed with a COPD exacerbation and right lower lobe   pneumonia. Concern for probably  episode of  aspiration.  She was tx w/ aggressive pulmonary hygiene and IV abx . Improved w/ transition over to oral/inhaler meds at discharge. She was last seen 2007 by Dr. Sherene Sires for her COPD managment. She has plans to establish with Dr. Artist Pais and would like to be seen at our [facility] office. Since discharge she is slowly improving, continues to get out of breath quite easily. She is currently on Symbicort 160 and Spiriva. Denies chest pain,  orthopnea, hemoptysis, fever, n/v/d, edema, headache.  February 22, 2009 10:41 AM Not as good as the 02/01/09.  Pt notes lips turning blue.  Pt is more dyspneic.  More cough but is dry.  Not on oxygen at home.  No chest pain.  Now on neb plus symbicort plus spiriva   March 01, 2009 10:42 AM At last ov on 1/27 we rx: Start oxygen 2Liter rest, 3Liter exertion obtain today from [facility] main office Start Bystolic 10mg  (2 - 5mg  tab or 1 - 10mg  tab) daily Stop Metoprolol Stop duoneb Stay on Symbicort and Spiriva daily Prednisone 10mg  4 each am x3days, 3 x 3days, 2 x 3days, 1 x 3days then stop Still dyspneic with any exertion but is better compared to 1/27 ov.  There is less cough ,  dyspnea is better.  Pt now on oxygen day and night.  There is no chest pain. There is no wheeze.    Pt denies any significant sore throat, nasal congestion  or excess secretions, fever, chills, sweats, unintended weight loss, pleurtic or exertional chest pain, orthopnea PND, or leg swelling Pt denies any increase in rescue therapy over baseline, denies waking up needing it or having any early am or nocturnal exacerbations of coughing/wheezing/or dyspnea.     Preventive Screening-Counseling & Management  Alcohol-Tobacco     Smoking Status: quit > 6 months  Current Medications (verified): 1)  Simvastatin 20 Mg Tabs (Simvastatin) .... Take 1 Tab By Mouth At Bedtime 2)  Bystolic 10 Mg  Tabs (Nebivolol Hcl) .... One Tablet Daily 3)  Prilosec 20 Mg Cpdr (Omeprazole) .... Take 1 Capsule By Mouth Two Times A Day 4)  Flexeril 10 Mg Tabs (Cyclobenzaprine Hcl) .... Take 1 Tablet By Mouth Two Times A Day 5)  Dilaudid 4 Mg Tabs (Hydromorphone Hcl) .... Take 1 Tablet By Mouth Two Times A Day 6)  Bayer Low Strength 81 Mg Tbec (Aspirin) .... Take 1 Tablet By Mouth Once A Day 7)  Spiriva Handihaler 18 Mcg Caps (Tiotropium Bromide Monohydrate) .... Inhale Contents of 1 Capsule Once A Day 8)  Symbicort 160-4.5 Mcg/act Aero (Budesonide-Formoterol Fumarate) .... Inhale 2 Puffs Two Times A Day 9)  Caltrate 600+d Plus 600-400 Mg-Unit Tabs (Calcium Carbonate-Vit D-Min) .... One By Mouth Two Times A Day With Meals 10)  Dilaudid  Pump .... Continuous 11)  Prednisone 10 Mg  Tabs (Prednisone) .... Take As Directed 4 Each Am X3days, 3 X 3days, 2 X 3days, 1 X 3days Then Stop 12)  Aerochamber Mv  Misc (Spacer/aero-Holding Chambers) .... Use With Symbicort  Allergies (verified): 1)  ! Codeine 2)  ! Morphine 3)  ! * Contrast Dye  Past History:  Past medical, surgical, family and social histories (including risk factors) reviewed, and no changes noted (except as noted below).  Past Medical History: Reviewed history from 02/12/2009 and no changes required.  OSTEOARTHRITIS (ICD-715.90) HIATAL HERNIA (ICD-553.3) GERD (ICD-530.81) PNEUMONIA (ICD-486) COPD  (ICD-496) HYPOTENSION (ICD-458.9)  PEPTIC ULCER DISEASE (ICD-533.90) CARDIAC ARRHYTHMIA (ICD-427.9) HYPERCHOLESTEROLEMIA (ICD-272.0) HEART FAILURE (ICD-428.9) HYPERTENSION (ICD-401.9) Hx of Spondylolysis with intractable back pain    Past Surgical History: Reviewed history from 02/12/2009 and no changes required. gallbladder - 2008  hysterectomy - 1970s back surgeries - 1970s, 1995, 2007 neck surgery - 1970s spinal cord stimulator  Family History: Reviewed history from 02/12/2009 and no changes required. heart disease - father cancer - mother [name] Family History Hypertension    Social History: Reviewed history from 02/12/2009 and no changes required. former smoker, quit in 1990, x80yrs, 1ppd. no alcohol widow  3 children lives with great-granddaughter  retired: worked for [employer]  Review of Systems       The patient complains of shortness of breath with activity and non-productive cough.  The patient denies shortness of breath at rest, productive cough, coughing up blood, chest pain, irregular heartbeats, acid heartburn, indigestion, loss of appetite, weight change, abdominal pain, difficulty swallowing, sore throat, tooth/dental problems, headaches, nasal congestion/difficulty breathing through nose, sneezing, itching, ear ache, anxiety, depression, hand/feet swelling, joint stiffness or pain, rash, change in color of mucus, and fever.    Vital Signs:  Patient profile:   77 year old female Height:      62 inches Weight:      132 pounds BMI:     24.23 O2 Sat:      97 % on 3 L/minpulsed Temp:     98.5 degrees F oral Pulse rate:   93 / minute BP sitting:   138 / 70  (left arm) Cuff size:   regular  Vitals Entered By: Gweneth Dimitri RN (March 01, 2009 10:30 AM)  O2 Flow:  3 L/minpulsed CC: 1 wk follow up.  states she is feeling a little better.  still having SOB when "in a hurry" and still having a "raspy" cough.  c/o sores in mouth x2days. Comments  Medications reviewed with patient Daytime contact number verified with patient. Gweneth Dimitri RN  March 01, 2009 10:35 AM     Impression & Recommendations:  Problem # 1:  COPD (ICD-496) Assessment Improved  COPD with improved airway inflammation and 2% nocturnal desaturation  plan Cont  oxygen 2Liter rest, 3Liter exertion obtain today from [name] main office Cont Bystolic 10mg  (2 - 5mg  tab or 1 - 10mg  tab) daily Stay on Symbicort and Spiriva daily refer to pulmonary rehab Return [place] two months  Complete Medication List: 1)  Simvastatin 20 Mg Tabs (Simvastatin) .... Take 1 tab by mouth at bedtime 2)  Bystolic 10 Mg Tabs (Nebivolol hcl) .... One tablet daily 3)  Prilosec 20 Mg Cpdr (Omeprazole) .... Take 1 capsule by mouth two times a day 4)  Flexeril 10 Mg Tabs (Cyclobenzaprine hcl) .... Take 1 tablet by mouth two times a day 5)  Dilaudid 4 Mg Tabs (Hydromorphone  hcl) .... Take 1 tablet by mouth two times a day 6)  Bayer Low Strength 81 Mg Tbec (Aspirin) .... Take 1 tablet by mouth once a day 7)  Spiriva Handihaler 18 Mcg Caps (Tiotropium bromide monohydrate) .... Inhale contents of 1 capsule once a day 8)  Symbicort 160-4.5 Mcg/act Aero (Budesonide-formoterol fumarate) .... Inhale 2 puffs two times a day 9)  Caltrate 600+d Plus 600-400 Mg-unit Tabs (Calcium carbonate-vit d-min) .... One by mouth two times a day with meals 10)  Dilaudid Pump  .... Continuous 11)  Prednisone 10 Mg Tabs (Prednisone) .... Take as directed 4 each am x3days, 3 x 3days, 2 x 3days, 1 x 3days then stop 12)  Aerochamber Mv Misc (Spacer/aero-holding chambers) .... Use with symbicort  Other Orders: Est. Patient Level IV (14782) Pulmonary Referral (Pulmonary)  Patient Instructions: 1)  No change in medications 2)  Referral to [facility] rehab at [facility] 3)  Return [place] two months

## 2010-02-28 NOTE — Progress Notes (Signed)
  Phone Note From Other Clinic   Caller: [facility] Details for Reason: Hospital Bed Summary of Call: [facility]  called Pt  to deliver  hospital bed  , patient refused bed Initial call taken by: Darral Dash,  February 15, 2009 8:44 AM  Follow-up for Phone Call        noted Follow-up by: D. Thomos Lemons DO,  February 15, 2009 12:10 PM

## 2010-02-28 NOTE — Assessment & Plan Note (Signed)
Summary: NP post hosp follow up   CC:  post hosp follow up - states breathing is about the same.  History of Present Illness: 77 yo female with known hx COPD.    02/01/2009-- Presents for post hosp follow up. Admitted 12/25-12/27/10 for COPD exacerbation. She has a hx of  degenerative disk disease on  chronic Dilaudid was brought in by her family for increased shortness of  breath.  She was diagnosed with a COPD exacerbation and right lower lobe   pneumonia. Concern for probably  episode of  aspiration.  She was tx w/ aggressive pulmonary hygiene and IV abx . Improved w/ transition over to oral/inhaler meds at discharge. She was last seen 2007 by Dr. Sherene Sires for her COPD managment. She has plans to establish with Dr. Artist Pais and would like to be seen at our [facility] office. Since discharge she is slowly improving, continues to get out of breath quite easily. She is currently on Symbicort 160 and Spiriva. Denies chest pain,  orthopnea, hemoptysis, fever, n/v/d, edema, headache.  Preventive Screening-Counseling & Management  Alcohol-Tobacco     Smoking Status: quit  Medications Prior to Update: 1)  None  Current Medications (verified): 1)  Simvastatin 20 Mg Tabs (Simvastatin) .... Take 1 Tab By Mouth At Bedtime 2)  Atenolol 25 Mg Tabs (Atenolol) .... 1/2 Tab By Mouth Two Times A Day 3)  Prilosec 20 Mg Cpdr (Omeprazole) .... Take 1 Capsule By Mouth Two Times A Day 4)  Flexeril 10 Mg Tabs (Cyclobenzaprine Hcl) .... Take 1 Tablet By Mouth Two Times A Day 5)  Dilaudid 4 Mg Tabs (Hydromorphone Hcl) .... Take 1 Tablet By Mouth Two Times A Day 6)  Bayer Low Strength 81 Mg Tbec (Aspirin) .... Take 1 Tablet By Mouth Once A Day 7)  Proair Hfa 108 (90 Base) Mcg/act Aers (Albuterol Sulfate) .... Inhale 2 Puffs Every Four Hours As Needed 8)  Spiriva Handihaler 18 Mcg Caps (Tiotropium Bromide Monohydrate) .... Inhale Contents of 1 Capsule Once A Day 9)  Symbicort 160-4.5 Mcg/act Aero  (Budesonide-Formoterol Fumarate) .... Inhale 2 Puffs Two Times A Day  Allergies (verified): 1)  ! Codeine 2)  ! Morphine 3)  ! * Contrast Dye  Past History:  Family History: Last updated: 02/01/2009 heart disease - father cancer - mother [name]  Social History: Last updated: 02/01/2009 former smoker, quit in 1990, x71yrs, 1ppd. no alcohol widow 3 children lives with great-granddaughter  retired: worked for [employer] of [employer]  Risk Factors: Smoking Status: quit (02/01/2009)  Past Medical History:  OSTEOARTHRITIS (ICD-715.90) HIATAL HERNIA (ICD-553.3) GERD (ICD-530.81) PNEUMONIA (ICD-486) COPD (ICD-496) HYPOTENSION (ICD-458.9) PEPTIC ULCER DISEASE (ICD-533.90) CARDIAC ARRHYTHMIA (ICD-427.9) HYPERCHOLESTEROLEMIA (ICD-272.0) HEART FAILURE (ICD-428.9) HYPERTENSION (ICD-401.9)    Past Surgical History: gallbladder - 2008 hysterectomy - 1970s back surgeries - 1970s, 1995, 2007 neck surgery - 1970s  Family History: heart disease - father cancer - mother [name]  Social History: former smoker, quit in 1990, x50yrs, 1ppd. no alcohol widow 3 children lives with great-granddaughter  retired: worked for [employer] of [employer] Smoking Status:  quit  Review of Systems      See HPI  Vital Signs:  Patient profile:   77 year old female Height:      62 inches Weight:      126 pounds BMI:     23.13 O2 Sat:      90 % on Room air Temp:     98.3 degrees F oral Pulse rate:   84 /  minute BP sitting:   120 / 66  (left arm) Cuff size:   regular  Vitals Entered By: Boone Master CNA (February 01, 2009 3:15 PM)  O2 Flow:  Room air CC: post hosp follow up - states breathing is about the same Is Patient Diabetic? No Comments Medications reviewed with patient Daytime contact number verified with patient. Boone Master CNA  February 01, 2009 3:16 PM     CXR  Procedure date:  02/01/2009  Findings:      IMPRESSION: No acute disease.  Interval clearing of patchy airspace disease  in the right lower lung zone.    Impression & Recommendations:  Problem # 1:  COPD (ICD-496) Recent exacerbation w/ PNA ? seconary aspiration.  Xray with clearance of PNA  she does have bordeline O2 sats , will set up for ovenight oximetry to make sure no nocturnal desaturations.  REC: return for PFT cont on same meds follow up 2-3 weeks Dr. Delford Field  and as needed   Her updated medication list for this problem includes:    Prilosec 20 Mg Cpdr (Omeprazole) .[name]... Take 1 capsule by mouth two times a day  Orders: DME Referral (DME) Est. Patient Level IV (11914)  Medications Added to Medication List This Visit: 1)  Simvastatin 20 Mg Tabs (Simvastatin) .... Take 1 tab by mouth at bedtime 2)  Atenolol 25 Mg Tabs (Atenolol) .... 1/2 tab by mouth two times a day 3)  Prilosec 20 Mg Cpdr (Omeprazole) .... Take 1 capsule by mouth two times a day 4)  Flexeril 10 Mg Tabs (Cyclobenzaprine hcl) .... Take 1 tablet by mouth two times a day 5)  Dilaudid 4 Mg Tabs (Hydromorphone hcl) .... Take 1 tablet by mouth two times a day 6)  Bayer Low Strength 81 Mg Tbec (Aspirin) .... Take 1 tablet by mouth once a day 7)  Proair Hfa 108 (90 Base) Mcg/act Aers (Albuterol sulfate) .... Inhale 2 puffs every four hours as needed 8)  Spiriva Handihaler 18 Mcg Caps (Tiotropium bromide monohydrate) .... Inhale contents of 1 capsule once a day 9)  Symbicort 160-4.5 Mcg/act Aero (Budesonide-formoterol fumarate) .... Inhale 2 puffs two times a day  Complete Medication List: 1)  Simvastatin 20 Mg Tabs (Simvastatin) .... Take 1 tab by mouth at bedtime 2)  Atenolol 25 Mg Tabs (Atenolol) .... 1/2 tab by mouth two times a day 3)  Prilosec 20 Mg Cpdr (Omeprazole) .... Take 1 capsule by mouth two times a day 4)  Flexeril 10 Mg Tabs (Cyclobenzaprine hcl) .... Take 1 tablet by mouth two times a day 5)  Dilaudid 4 Mg Tabs (Hydromorphone hcl) .... Take 1 tablet by mouth two times a day 6)  Bayer Low Strength 81 Mg Tbec (Aspirin)  .... Take 1 tablet by mouth once a day 7)  Proair Hfa 108 (90 Base) Mcg/act Aers (Albuterol sulfate) .... Inhale 2 puffs every four hours as needed 8)  Spiriva Handihaler 18 Mcg Caps (Tiotropium bromide monohydrate) .... Inhale contents of 1 capsule once a day 9)  Symbicort 160-4.5 Mcg/act Aero (Budesonide-formoterol fumarate) .... Inhale 2 puffs two times a day  Other Orders: T-2 View CXR ([id number])  Patient Instructions: 1)  Continue on same meds.  2)  I will call with xray results [phone] - 3)  [name] 4)  Return for PFTs  5)  follow up 3 weeks Dr. Delford Field here at [facility]  6)  Please contact office for sooner follow up if symptoms do not improve  or worsen   Appended Document: NP post hosp follow up I agree with this plan of care  pw

## 2010-02-28 NOTE — Progress Notes (Signed)
Summary: PFT results-ATC  Phone Note Outgoing Call   Reason for Call: Discuss lab or test results Summary of Call: Tammy,  I have not seen this pt yet,  pls call her with her PFT results as you were last provider to see her then I will f/u in [facility].  thks  PW Initial call taken by: Storm Frisk MD,  February 12, 2009 11:05 AM  Follow-up for Phone Call        no answer,  PFTs show mild changes with sl decrease in FEV1 will discuss further at follow up next week, no changes at this time.  Follow-up by: Rubye Oaks NP,  February 12, 2009 2:24 PM  Additional Follow-up for Phone Call Additional follow up Details #1::        ATC no answer, no voicemail. Carron Curie CMA  February 12, 2009 2:27 PM  pt advised. Carron Curie CMA  February 13, 2009 11:01 AM

## 2010-02-28 NOTE — Progress Notes (Signed)
Summary: Chest Pain  Phone Note From Other Clinic Call back at [phone]   Caller: Nurse Summary of Call: SW Marylene Land with [facility], states pt mentioned pain in her jaw & chest  that only lasted one min, vitals good bp 116/68 pulse 80 Initial call taken by: Lannette Donath,  August 02, 2009 3:48 PM  Follow-up for Phone Call        call returned to [facility] with [facility]; she states she has been seeing patient  for the past 2 weeks, and today is the first time that patient has mentiioned that she has been having chest pain with radiating sharp left jaw pain that patient says only last for a minute. Marylene Land states the patients daughter is there with the patient, and states this has been ongoing for the past year, but seems to have gotten worse. Marylene Land was advised to have patient seek emergency care in ER. She states that she will inform patients daughter to do so Follow-up by: Glendell Docker CMA,  August 02, 2009 3:52 PM

## 2010-02-28 NOTE — Letter (Signed)
Summary: CMN for [facility]  CMN for [facility]   Imported By: Sherian Rein 06/29/2009 08:53:49  _____________________________________________________________________  External Attachment:    Type:   Image     Comment:   External Document

## 2010-02-28 NOTE — Miscellaneous (Signed)
Summary: Face to Face Encounter Form/[facility]  Face to Face Encounter Form/[facility]   Imported By: Lanelle Bal 08/03/2009 08:57:45  _____________________________________________________________________  External Attachment:    Type:   Image     Comment:   External Document

## 2010-02-28 NOTE — Progress Notes (Signed)
Summary: Status Update  Phone Note Other Incoming Call back at [phone]   Caller: Lanetta Inch Summary of Call: voice message left from [place] with [facility], stating she was out to see patient and her assessment found patient has right and left lobe Rhonci,productive cough with thick yellow sputum,  she states patient does not have a fever. Marylene Land is also asking for clarification on patients oxygen. Her discharge orders states patient is to be on oxygen continuos, however she states patient  is using the oxygen at night and as needed during the day.  Initial call taken by: Glendell Docker CMA,  July 24, 2009 4:31 PM  Follow-up for Phone Call        please advise pt to starrt abx - levaquin and clindamycin.  I suggest OV with 1-2 days Follow-up by: D. Thomos Lemons DO,  July 24, 2009 4:42 PM  Additional Follow-up for Phone Call Additional follow up Details #1::        call placed to patient informinf patient of medication change and follow up visit per Dr Artist Pais instructions. Patient states she will call back to schedule follow up as instructed.  attempted to contact Marylene Land with [facility] at [phone] answer a detailed voice message was left informing her contact was made with patient regarding phone message, and if there were additional questions to return call if needed Additional Follow-up by: Glendell Docker CMA,  July 24, 2009 4:52 PM    New/Updated Medications: LEVAQUIN 500 MG TABS (LEVOFLOXACIN) one by mouth once daily CLINDAMYCIN HCL 300 MG CAPS (CLINDAMYCIN HCL) one by mouth three times a day Prescriptions: CLINDAMYCIN HCL 300 MG CAPS (CLINDAMYCIN HCL) one by mouth three times a day  #30 x 0   Entered and Authorized by:   D. Thomos Lemons DO   Signed by:   D. Thomos Lemons DO on 07/24/2009   Method used:   Electronically to        [facility] (retail)       [phone]       [place], [place]  [address]       Ph: [phone]       Fax:  [phone]   RxID:   [id number] LEVAQUIN 500 MG TABS (LEVOFLOXACIN) one by mouth once daily  #10 x 0   Entered and Authorized by:   D. Thomos Lemons DO   Signed by:   D. Thomos Lemons DO on 07/24/2009   Method used:   Electronically to        [facility] (retail)       [phone]       [place], [place]  [id number]       Ph: [phone]       Fax: [phone]   RxID:   [id number]

## 2010-02-28 NOTE — Progress Notes (Signed)
Summary: oxygen  Phone Note Call from Patient Call back at [phone]   Caller: Other Relative angie Call For: wright Summary of Call: granddaughter have questions about oxygen use for pt Initial call taken by: Rickard Patience,  March 16, 2009 1:44 PM  Follow-up for Phone Call         Granddaughter wanted to know what leters of o2 she was supposed to be on. I advised according to last ov not from PW on 03/01/09 pt is to use 2 liters at rest and 3 liters with exertion.  she stated understanding. Carron Curie CMA  March 16, 2009 3:23 PM

## 2010-02-28 NOTE — Progress Notes (Signed)
Summary: referral & new phone number  Phone Note Call from Patient Call back at [phone]   Caller: Daughter Call For: wright Summary of Call: York Spaniel someone was supposed to call them about pulmonary rehab a week ago, and they still haven't heard from anyone, please advise.  Follow-up for Phone Call        Bjorn Loser can you help with this helen faxed order on 2/7 but pt has not heard from pulm rehab yet how long does this usually take  Philipp Deputy [facility]  March 08, 2009 3:07 PM   Granddaughter asked that we give her phone number to [facility].  She will be the one that takes pt and pt gets confused at age 77.  Can you call them and give them(Pulm.Rehab) the call back number listed on this message?  She does understand that it can take up to 2 weeks for them to contact now. Follow-up by: Eugene Gavia,  March 09, 2009 11:01 AM  Additional Follow-up for Phone Call Additional follow up Details #1::        Called phone number back to get granddaughter's name in order to provide this information to [facility]. Pt's granddaughter is [name] and she is the person to contact to schedule the pulmonary rehab classes. [name] was advised that it usually takes 2-3 wks once referral has been sent in before she is contacted by pulmonary rehab. Spoke with Marily Memos at Pulmonary Rehab and gave her the above contact information for phyllis to call to arrange rehab. Advised [name] that if she hasnt heard from them in that time period to call us back. [name] aware.  Additional Follow-up by: Alfonso Ramus,  March 09, 2009 11:33 AM

## 2010-02-28 NOTE — Assessment & Plan Note (Signed)
Summary: 2 mo. f/u - jr   Vital Signs:  Patient profile:   77 year old female Height:      62 inches Weight:      134 pounds BMI:     24.60 O2 Sat:      97 % on 3 L/min Temp:     98.8 degrees F oral Pulse (ortho):   77 / minute Pulse rhythm:   regular Resp:     18 per minute BP sitting:   108 / 60  (right arm) Cuff size:   regular  Vitals Entered By: Glendell Docker CMA (April 16, 2009 2:06 PM)  O2 Flow:  3 L/min  Contraindications/Deferment of Procedures/Staging:    Test/Procedure: PAP Smear    Reason for deferment: patient declined     Test/Procedure: Mammogram    Reason for deferment: patient declined  CC: Rm 3- Follow up      Last PAP Result Declined   Primary Care Provider:  DThomos Lemons DO  CC:  Rm 3- Follow up .  History of Present Illness:  Hypertension Follow-Up      This is an 77 year old woman who presents for Hypertension follow-up.  The patient reports edema, but denies lightheadedness.  The patient denies the following associated symptoms: chest pain.  Compliance with medications (by patient report) has been near 100%.  The patient reports that dietary compliance has been fair.    Allergies: 1)  ! Codeine 2)  ! Morphine 3)  ! * Contrast Dye  Past History:  Past Medical History: OSTEOPOROSIS -  Reclast rx 03/07/09 OSTEOARTHRITIS (ICD-715.90) HIATAL HERNIA (ICD-553.3) GERD (ICD-530.81) PNEUMONIA (ICD-486) COPD (ICD-496)  HYPOTENSION (ICD-458.9)  PEPTIC ULCER DISEASE (ICD-533.90) CARDIAC ARRHYTHMIA (ICD-427.9) HYPERCHOLESTEROLEMIA (ICD-272.0) HEART FAILURE (ICD-428.9) HYPERTENSION (ICD-401.9) Hx of Spondylolysis with intractable back pain    Past Surgical History: gallbladder - 2008  hysterectomy - 1970s back surgeries - 1970s, 1995, 2007 neck surgery - 1970s spinal cord stimulator    Family History: heart disease - father cancer - mother [name] Family History Hypertension     Physical Exam  General:  alert, well-developed,  and well-nourished.   Lungs:  normal respiratory effort.  decreased breath sounds.  prolonged expiration Heart:  normal rate, regular rhythm, and no gallop.   Extremities:  trace left pedal edema and trace right pedal edema.     Impression & Recommendations:  Problem # 1:  HYPERTENSION (ICD-401.9) BP with manual cuff 100/60.  reduce bystolic dose.   I suspect lower ext edema side effect from bystolic.  BNP and CXR - does not support CHF  Her updated medication list for this problem includes:    Bystolic 5 Mg Tabs (Nebivolol hcl) ..... One by mouth once daily  BP today: 108/60 Prior BP: 120/72 (04/12/2009)  Labs Reviewed: K+: 4.6 (04/09/2009) Creat: : 0.78 (04/09/2009)   Chol: 151 (04/09/2009)   HDL: 39 (04/09/2009)   LDL: 87 (04/09/2009)   TG: 125 (04/09/2009)  Complete Medication List: 1)  Simvastatin 20 Mg Tabs (Simvastatin) .... Take 1 tab by mouth at bedtime 2)  Bystolic 5 Mg Tabs (Nebivolol hcl) .... One by mouth once daily 3)  Prilosec 20 Mg Cpdr (Omeprazole) .... Take one 30-60 min before first and last meals of the day 4)  Flexeril 10 Mg Tabs (Cyclobenzaprine hcl) .... Take 1 tablet by mouth two times a day 5)  Dilaudid 4 Mg Tabs (Hydromorphone hcl) .... Take 1 tablet by mouth two times a day 6)  Bayer  Low Strength 81 Mg Tbec (Aspirin) .... Take 1 tablet by mouth once a day 7)  Spiriva Handihaler 18 Mcg Caps (Tiotropium bromide monohydrate) .... Inhale contents of 1 capsule once a day 8)  Symbicort 160-4.5 Mcg/act Aero (Budesonide-formoterol fumarate) .... Inhale 2 puffs two times a day 9)  Caltrate 600+d Plus 600-400 Mg-unit Tabs (Calcium carbonate-vit d-min) .... One by mouth two times a day with meals 10)  Dilaudid Pump  .... Continuous 11)  Aerochamber Mv Misc (Spacer/aero-holding chambers) .... Use with symbicort 12)  Vitamin D3 1000 Unit Caps (Cholecalciferol) .... One by mouth once daily 13)  Ventolin Hfa 108 (90 Base) Mcg/act Aers (Albuterol sulfate) .... 2 puffs by  mouth once daily  Patient Instructions: 1)  Please schedule a follow-up appointment in 2 months. Prescriptions: BYSTOLIC 5 MG TABS (NEBIVOLOL HCL) one by mouth once daily  #30 x 3   Entered and Authorized by:   D. Thomos Lemons DO   Signed by:   D. Thomos Lemons DO on 04/16/2009   Method used:   Electronically to        [facility] (retail)       [phone]       [place], [place]  [id number]       Ph: [phone]       Fax: [phone]   RxID:   [id number]    Preventive Care Screening  Mammogram:    Date:  04/16/2009    Results:  Declined  Pap Smear:    Date:  04/16/2009    Results:  Declined    Current Allergies (reviewed today): ! CODEINE ! MORPHINE ! * CONTRAST DYE

## 2010-03-01 ENCOUNTER — Inpatient Hospital Stay (HOSPITAL_COMMUNITY)
Admission: RE | Admit: 2010-03-01 | Discharge: 2010-03-05 | DRG: 392 | Disposition: A | Discharge status: Home / Self Care | Payer: Medicare Other | Source: Ambulatory Visit | Attending: Gastroenterology | Admitting: Gastroenterology

## 2010-03-01 ENCOUNTER — Ambulatory Visit (HOSPITAL_COMMUNITY): Payer: Medicare Other

## 2010-03-01 DIAGNOSIS — R112 Nausea with vomiting, unspecified: Secondary | ICD-10-CM | POA: Diagnosis present

## 2010-03-01 DIAGNOSIS — J4489 Other specified chronic obstructive pulmonary disease: Secondary | ICD-10-CM | POA: Diagnosis present

## 2010-03-01 DIAGNOSIS — Z9981 Dependence on supplemental oxygen: Secondary | ICD-10-CM

## 2010-03-01 DIAGNOSIS — E785 Hyperlipidemia, unspecified: Secondary | ICD-10-CM | POA: Diagnosis present

## 2010-03-01 DIAGNOSIS — I509 Heart failure, unspecified: Secondary | ICD-10-CM | POA: Diagnosis present

## 2010-03-01 DIAGNOSIS — R1013 Epigastric pain: Principal | ICD-10-CM | POA: Diagnosis present

## 2010-03-01 DIAGNOSIS — G894 Chronic pain syndrome: Secondary | ICD-10-CM | POA: Diagnosis present

## 2010-03-01 DIAGNOSIS — J449 Chronic obstructive pulmonary disease, unspecified: Secondary | ICD-10-CM | POA: Diagnosis present

## 2010-03-01 DIAGNOSIS — I10 Essential (primary) hypertension: Secondary | ICD-10-CM | POA: Diagnosis present

## 2010-03-01 DIAGNOSIS — K8050 Calculus of bile duct without cholangitis or cholecystitis without obstruction: Secondary | ICD-10-CM | POA: Diagnosis present

## 2010-03-01 DIAGNOSIS — Y849 Medical procedure, unspecified as the cause of abnormal reaction of the patient, or of later complication, without mention of misadventure at the time of the procedure: Secondary | ICD-10-CM | POA: Diagnosis present

## 2010-03-01 DIAGNOSIS — K219 Gastro-esophageal reflux disease without esophagitis: Secondary | ICD-10-CM | POA: Diagnosis present

## 2010-03-01 LAB — CBC
HCT: 38.3 % (ref 36.0–46.0)
HCT: 39.8 % (ref 36.0–46.0)
Hemoglobin: 12.5 g/dL (ref 12.0–15.0)
Hemoglobin: 13 g/dL (ref 12.0–15.0)
MCH: 30.3 pg (ref 26.0–34.0)
MCH: 30.3 pg (ref 26.0–34.0)
MCHC: 32.6 g/dL (ref 30.0–36.0)
MCHC: 32.7 g/dL (ref 30.0–36.0)
MCV: 93 fL (ref 78.0–100.0)
MCV: 92.8 fL (ref 78.0–100.0)
Platelets: 159 10*3/uL (ref 150–400)
Platelets: 144 10*3/uL — ABNORMAL LOW (ref 150–400)
RBC: 4.12 MIL/uL (ref 3.87–5.11)
RBC: 4.29 MIL/uL (ref 3.87–5.11)
RDW: 14.2 % (ref 11.5–15.5)
RDW: 14.2 % (ref 11.5–15.5)
WBC: 5.5 10*3/uL (ref 4.0–10.5)
WBC: 9.1 10*3/uL (ref 4.0–10.5)

## 2010-03-01 LAB — COMPREHENSIVE METABOLIC PANEL
ALT: 15 U/L (ref 0–35)
ALT: 25 U/L (ref 0–35)
AST: 21 U/L (ref 0–37)
AST: 52 U/L — ABNORMAL HIGH (ref 0–37)
Albumin: 3.4 g/dL — ABNORMAL LOW (ref 3.5–5.2)
Albumin: 3.2 g/dL — ABNORMAL LOW (ref 3.5–5.2)
Alkaline Phosphatase: 84 U/L (ref 39–117)
Alkaline Phosphatase: 95 U/L (ref 39–117)
BUN: 12 mg/dL (ref 6–23)
BUN: 11 mg/dL (ref 6–23)
CO2: 33 mEq/L — ABNORMAL HIGH (ref 19–32)
CO2: 29 mEq/L (ref 19–32)
Calcium: 9 mg/dL (ref 8.4–10.5)
Calcium: 8.2 mg/dL — ABNORMAL LOW (ref 8.4–10.5)
Chloride: 100 mEq/L (ref 96–112)
Chloride: 101 mEq/L (ref 96–112)
Creatinine, Ser: 0.83 mg/dL (ref 0.4–1.2)
Creatinine, Ser: 0.73 mg/dL (ref 0.4–1.2)
GFR calc Af Amer: >60 mL/min (ref >60)
GFR calc Af Amer: >60 mL/min (ref >60)
GFR calc non Af Amer: >60 mL/min (ref >60)
GFR calc non Af Amer: >60 mL/min (ref >60)
Glucose, Bld: 94 mg/dL (ref 70–99)
Glucose, Bld: 129 mg/dL — ABNORMAL HIGH (ref 70–99)
Potassium: 3.8 mEq/L (ref 3.5–5.1)
Potassium: 3.5 mEq/L (ref 3.5–5.1)
Sodium: 141 mEq/L (ref 135–145)
Sodium: 137 mEq/L (ref 135–145)
Total Bilirubin: 0.4 mg/dL (ref 0.3–1.2)
Total Bilirubin: 0.2 mg/dL — ABNORMAL LOW (ref 0.3–1.2)
Total Protein: 6.6 g/dL (ref 6.0–8.3)
Total Protein: 6.1 g/dL (ref 6.0–8.3)

## 2010-03-01 LAB — TYPE AND SCREEN
ABO/RH(D): O POS
Antibody Screen: NEGATIVE

## 2010-03-01 LAB — DIFFERENTIAL
Basophils Absolute: 0 10*3/uL (ref 0.0–0.1)
Basophils Relative: 0 % (ref 0–1)
Eosinophils Absolute: 0.1 10*3/uL (ref 0.0–0.7)
Eosinophils Relative: 1 % (ref 0–5)
Lymphocytes Relative: 9 % — ABNORMAL LOW (ref 12–46)
Lymphs Abs: 0.9 10*3/uL (ref 0.7–4.0)
Monocytes Absolute: 0.2 10*3/uL (ref 0.1–1.0)
Monocytes Relative: 2 % — ABNORMAL LOW (ref 3–12)
Neutro Abs: 8 10*3/uL — ABNORMAL HIGH (ref 1.7–7.7)
Neutrophils Relative %: 88 % — ABNORMAL HIGH (ref 43–77)

## 2010-03-01 LAB — PROTIME-INR
INR: 0.98 (ref 0.00–1.49)
Prothrombin Time: 13.2 seconds (ref 11.6–15.2)

## 2010-03-01 LAB — ABO/RH: ABO/RH(D): O POS

## 2010-03-01 LAB — MRSA PCR SCREENING: MRSA by PCR: NEGATIVE

## 2010-03-01 LAB — LIPASE, BLOOD: Lipase: 30 U/L (ref 11–59)

## 2010-03-02 ENCOUNTER — Inpatient Hospital Stay (HOSPITAL_COMMUNITY): Payer: Medicare Other

## 2010-03-02 LAB — COMPREHENSIVE METABOLIC PANEL
ALT: 30 U/L (ref 0–35)
AST: 42 U/L — ABNORMAL HIGH (ref 0–37)
Albumin: 2.8 g/dL — ABNORMAL LOW (ref 3.5–5.2)
Alkaline Phosphatase: 122 U/L — ABNORMAL HIGH (ref 39–117)
BUN: 9 mg/dL (ref 6–23)
CO2: 27 mEq/L (ref 19–32)
Calcium: 8 mg/dL — ABNORMAL LOW (ref 8.4–10.5)
Chloride: 98 mEq/L (ref 96–112)
Creatinine, Ser: 0.79 mg/dL (ref 0.4–1.2)
GFR calc Af Amer: >60 mL/min (ref >60)
GFR calc non Af Amer: >60 mL/min (ref >60)
Glucose, Bld: 159 mg/dL — ABNORMAL HIGH (ref 70–99)
Potassium: 3.6 mEq/L (ref 3.5–5.1)
Sodium: 134 mEq/L — ABNORMAL LOW (ref 135–145)
Total Bilirubin: 0.4 mg/dL (ref 0.3–1.2)
Total Protein: 6.1 g/dL (ref 6.0–8.3)

## 2010-03-02 LAB — CBC
HCT: 44.6 % (ref 36.0–46.0)
Hemoglobin: 14.8 g/dL (ref 12.0–15.0)
MCH: 30.3 pg (ref 26.0–34.0)
MCHC: 33.2 g/dL (ref 30.0–36.0)
MCV: 91.4 fL (ref 78.0–100.0)
Platelets: 159 10*3/uL (ref 150–400)
RBC: 4.88 MIL/uL (ref 3.87–5.11)
RDW: 14.2 % (ref 11.5–15.5)
WBC: 13 10*3/uL — ABNORMAL HIGH (ref 4.0–10.5)

## 2010-03-02 LAB — URINALYSIS, ROUTINE W REFLEX MICROSCOPIC
Bilirubin Urine: NEGATIVE
Ketones, ur: 15 mg/dL — AB
Leukocytes, UA: NEGATIVE
Nitrite: NEGATIVE
Protein, ur: NEGATIVE mg/dL
Specific Gravity, Urine: 1.034 — ABNORMAL HIGH (ref 1.005–1.030)
Urine Glucose, Fasting: NEGATIVE mg/dL
Urobilinogen, UA: 0.2 mg/dL (ref 0.0–1.0)
pH: 6.5 (ref 5.0–8.0)

## 2010-03-02 LAB — URINE MICROSCOPIC-ADD ON

## 2010-03-02 LAB — LIPASE, BLOOD: Lipase: 20 U/L (ref 11–59)

## 2010-03-02 MED ORDER — IOHEXOL 300 MG/ML  SOLN
100.0000 mL | Freq: Once | INTRAMUSCULAR | Status: AC | PRN
  Administered 2010-03-02: 100 mL via INTRAVENOUS

## 2010-03-03 LAB — COMPREHENSIVE METABOLIC PANEL
ALT: 21 U/L (ref 0–35)
AST: 25 U/L (ref 0–37)
Albumin: 2.2 g/dL — ABNORMAL LOW (ref 3.5–5.2)
Alkaline Phosphatase: 68 U/L (ref 39–117)
BUN: 7 mg/dL (ref 6–23)
CO2: 32 mEq/L (ref 19–32)
Calcium: 7.4 mg/dL — ABNORMAL LOW (ref 8.4–10.5)
Chloride: 102 mEq/L (ref 96–112)
Creatinine, Ser: 0.79 mg/dL (ref 0.4–1.2)
GFR calc Af Amer: >60 mL/min (ref >60)
GFR calc non Af Amer: >60 mL/min (ref >60)
Glucose, Bld: 87 mg/dL (ref 70–99)
Potassium: 4 mEq/L (ref 3.5–5.1)
Sodium: 142 mEq/L (ref 135–145)
Total Bilirubin: 0.4 mg/dL (ref 0.3–1.2)
Total Protein: 4.8 g/dL — ABNORMAL LOW (ref 6.0–8.3)

## 2010-03-03 LAB — CBC
HCT: 36 % (ref 36.0–46.0)
Hemoglobin: 11.9 g/dL — ABNORMAL LOW (ref 12.0–15.0)
MCH: 30.6 pg (ref 26.0–34.0)
MCHC: 33.1 g/dL (ref 30.0–36.0)
MCV: 92.5 fL (ref 78.0–100.0)
Platelets: 113 10*3/uL — ABNORMAL LOW (ref 150–400)
RBC: 3.89 MIL/uL (ref 3.87–5.11)
RDW: 14.5 % (ref 11.5–15.5)
WBC: 4.7 10*3/uL (ref 4.0–10.5)

## 2010-03-05 ENCOUNTER — Encounter: Payer: Self-pay | Admitting: Internal Medicine

## 2010-03-05 LAB — URINE CULTURE
Colony Count: 4000
Culture  Setup Time: 201202041707
Special Requests: POSITIVE

## 2010-03-07 ENCOUNTER — Encounter (HOSPITAL_BASED_OUTPATIENT_CLINIC_OR_DEPARTMENT_OTHER): Payer: Self-pay

## 2010-03-11 NOTE — Consult Note (Signed)
NAME:  [name] [name], [name] [name]               ACCOUNT NO.:  [id number]  MEDICAL RECORD NO.:  [id number]           PATIENT TYPE:  O  LOCATION:  1235                         FACILITY[facility]  [facility]  PHYSICIAN:  Homero Fellers, MD   DATE OF BIRTH:  29-Nov-1925  DATE OF CONSULTATION: DATE OF DISCHARGE:                                CONSULTATION   REFERRING PHYSICIAN:  Dr. Vida Rigger of GI service.  REASON FOR CONSULTATION:  Medical management.  HISTORY:  This is an 77 year old woman who just underwent ERCP procedure here today.  Procedure was well tolerated.  After the procedure, the patient started having epigastric abdominal pain and vomiting.  She is also very nauseous.  She has vomited about 9 to 10 times.  It seems the procedure was done about 4 hours ago.  Vomitus was initially bilious, but now it is becoming brownish and probably coffee-ground.  She was intubated during the procedure.  According to the operative report, she had a medium large sphincterotomy with multiple adjustable balloons, which probably 1 stone being removed.  There was no definite stone seen on cholangiogram.  She also has atrophic gastritis and 2 periampullary diverticula.  Please refer to full report for details.  The patient denied any chest pain or shortness of breath.  No leg swelling.  No back pain.  No diarrhea.  No fever.  PAST MEDICAL HISTORY:  Significant for: 1. Congestive heart failure with a normal ejection fraction going by     echocardiogram done in June of last year. 2. History of high blood pressure. 3. Hyperlipidemia. 4. COPD. 5. Chronic back pain. 6. Spondylosis. 7. History of chronic Dilaudid pump infusion for chronic back pain. 8. Osteoporosis. 9. Gout. 10.History of previous pancreatitis.  MEDICATIONS:  Going by the last discharge summary include, 1. Zofran. 2. Symbicort. 3. Fioricet. 4. Gabapentin. 5. Spiriva. 6. Bystolic. 7. Dilaudid pump 0.5 mg to 1 mg q.4 h. p.r.n. 8. Dilaudid  tablet. 9. Aspirin. 10.Phenergan. 11.Omeprazole. 12.Ventolin. 13.Caltrate. 14.Prandin. 15.Vitamin C. 16.Vitamin D2. 17.Apresoline.  ALLERGIES: 1. MORPHINE 2. CODEINE.  Both of which causes nausea and vomiting. 3. IV CONTRAST DYE.  SOCIAL HISTORY:  No smoking or alcohol use.  She lives alone, get support from her granddaughter.  Ten-point review of systems is essentially negative except as above.  PHYSICAL EXAMINATION:  VITAL SIGNS:  Blood pressure is 182/91, pulse 97, respirations 23, and O2 saturation 98%. GENERAL:  The patient is lying in bed in mild discomfort from abdominal pain.  She had 1 episode of vomiting during my evaluation, which was brownish in color. NECK:  Supple.  No JVD. LUNGS:  Clear to auscultation bilaterally.  No wheezing, no crackles. HEART:  S1 and S2.  No murmurs, rubs, or gallop. ABDOMEN:  Full, soft.  There is epigastric tenderness.  Bowel sounds present.  No masses. EXTREMITIES:  No edema, clubbing, or cyanosis. NEUROLOGICAL:  No deficits.  LABORATORY DATA:  White count today 9.1, hemoglobin 13.0, and platelet count 144.  Lipase is normal at 30.  Chemistry, BUN 11, creatinine 0.73, and potassium 3.5.  Liver enzymes, AST 52 and ALT 25.  Acute abdominal  series showed no evidence of bowel perforation or obstruction.  Chest, clear lungs.  No evidence of free air and no evidence of obstruction or ileus.  EKG, normal sinus rhythm.  ASSESSMENT:  This is an 77 year old woman who is status post endoscopy with retrograde cholangiopancreatography today following which she started having nausea, vomiting, and abdominal pain.  IMPRESSION: 1. Persistent nausea and vomiting, status post ERCP with abdominal     pain.  This is most likely that her symptoms related to the     procedure.  The operative report describes of oozing from the     sphincterotomy site, which stopped at the end of the procedure.  It     is possible that she is having mild oozing and  this is causing     irritation of the gastric and duodenum that causing vomiting. 2. Congestive heart failure appear compensated. 3. Chronic obstructive pulmonary disease appear compensated. 4. High blood pressure, which is currently uncontrolled, probably     related to poor pain control. 5. Hyperlipidemia. 6. Chronic back pain and chronic pain syndrome.  PLAN:  Keep n.p.o. for now, give gentle IV fluids, optimize pain control.  The patient has a Dilaudid pump.  I would like to use Demerol for pain control.  She can start clear liquid once symptoms improve.  I also put her on proton pump inhibitor, use Zofran or Phenergan for vomiting.  Also add some Reglan.  Other home medicines will be on hold.  We will give her some Spiriva and albuterol as should she takes at home.  She will get Lovenox for DVT prophylaxis.  Overall condition is guarded.  Thanks for consulting [facility].  We will follow.     Homero Fellers, MD     FA/MEDQ  D:  03/01/2010  T:  03/01/2010  Job:  [id number]  cc:   Petra Kuba, M.D. Fax: [phone]  Electronically Signed by Homero Fellers  on 03/04/2010 10:30:45 PM

## 2010-03-14 ENCOUNTER — Encounter: Payer: Self-pay | Admitting: Internal Medicine

## 2010-03-14 NOTE — Miscellaneous (Signed)
Summary: Face to Face Encounter/[facility]  Face to Face Encounter/[facility]   Imported By: Lanelle Bal 03/08/2010 09:51:17  _____________________________________________________________________  External Attachment:    Type:   Image     Comment:   External Document

## 2010-03-19 ENCOUNTER — Ambulatory Visit (INDEPENDENT_AMBULATORY_CARE_PROVIDER_SITE_OTHER): Payer: Medicare Other | Admitting: Internal Medicine

## 2010-03-19 ENCOUNTER — Encounter: Payer: Self-pay | Admitting: Internal Medicine

## 2010-03-19 DIAGNOSIS — K8689 Other specified diseases of pancreas: Secondary | ICD-10-CM

## 2010-03-19 DIAGNOSIS — I10 Essential (primary) hypertension: Secondary | ICD-10-CM

## 2010-03-20 NOTE — Letter (Signed)
Summary: Sleep Study cancelled -Patient in Hospital  Sleep Study cancelled -Patient in Hospital   Imported By: Maryln Gottron 03/14/2010 15:40:31  _____________________________________________________________________  External Attachment:    Type:   Image     Comment:   External Document

## 2010-03-20 NOTE — Procedures (Signed)
Summary: Endoscopic Ultrasound /[facility]  Endoscopic Ultrasound [facility]   Imported By: Maryln Gottron 03/14/2010 14:13:16  _____________________________________________________________________  External Attachment:    Type:   Image     Comment:   External Document

## 2010-03-26 NOTE — Letter (Signed)
Summary: [facility]  [facility]   Imported By: Maryln Gottron 03/21/2010 14:11:06  _____________________________________________________________________  External Attachment:    Type:   Image     Comment:   External Document

## 2010-03-28 ENCOUNTER — Telehealth: Payer: Self-pay | Admitting: Internal Medicine

## 2010-03-28 NOTE — Discharge Summary (Signed)
  NAME:  [name] [name], [name] [name]               ACCOUNT NO.:  [id number]  MEDICAL RECORD NO.:  [id number]           PATIENT TYPE:  I  LOCATION:  1521                         FACILITY:  [facility]  PHYSICIAN:  Bernette Redbird, M.D.   DATE OF BIRTH:  05-24-1925  DATE OF ADMISSION:  03/01/2010 DATE OF DISCHARGE:  03/05/2010                              DISCHARGE SUMMARY   FINAL DIAGNOSES: 1. Post endoscopic retrograde cholangiopancreatography pain, without     evidence of pancreatitis. 2. Chronic abdominal pain. 3. History of choledocholithiasis, status post endoscopic retrograde     cholangiopancreatography, sphincterotomy, and stone extraction on     February 3. 4. Multiple medical issues including chronic obstructive pulmonary     disease, congestive heart failure, anemia, reflux, chronic back     pain, history of recurrent pancreatitis.  CONSULTATIONS:  Hillery Aldo, MD, Internal Medicine  COMPLICATIONS:  None.  LABORATORY DATA:  The patient's labs on admission were unremarkable and in particular lipase was normal at 20, BUN 9, creatinine 0.8.  Liver chemistries within normal limits.  White count 13,000, hemoglobin 14.8 as of the day following admission.  HOSPITAL COURSE:  The patient was observed and managed symptomatically. Over the ensuing several days, her abdominal pain and nausea improved to the point where she was tolerating a solid diet and was ambulatory to the bathroom, so discharge home was felt to be clinically appropriate.  DISCHARGE MEDICATIONS:  The patient will resume her previous home medications.  No medications are being added or taken away from her preadmission medications.  DISPOSITION:  The patient is discharged home with followup arranged for Dr. Ewing Schlein in the office on February 16 at 12 noon.  CONDITION ON DISCHARGE:  Improved.          ______________________________ Bernette Redbird, M.D.     RB/MEDQ  D:  03/05/2010  T:  03/05/2010  Job:  [id number]  cc:    Barbette Hair. [place], DO [address] [place], [place] [address]  Petra Kuba, M.D. Fax: [phone]  Electronically Signed by Bernette Redbird M.D. on 03/28/2010 07:56:09 AM

## 2010-04-04 NOTE — Progress Notes (Signed)
Summary: refill-apap/butalbital/caff   Phone Note Refill Request Message from:  Fax from Pharmacy on March 28, 2010 3:52 PM  Refills Requested: Medication #1:  apap/butalbital/caff 325-50-40mg  take one tablet by mouth every 8 hours as needed   Brand Name Necessary? No   Supply Requested: 1 month   Last Refilled: 01/08/2010 [facility] [address] [place] [id number] fax [phone]   Method Requested: Electronic Next Appointment Scheduled: 4.19.12 yoo Initial call taken by: Elba Barman,  March 28, 2010 3:54 PM  Follow-up for Phone Call        ok to refill x 2 Follow-up by: D. Thomos Lemons DO,  March 29, 2010 1:29 PM  Additional Follow-up for Phone Call Additional follow up Details #1::        Rx called to pharmacy to [facility], call placed to patient at [phone], she was informed of rx refill. Additional Follow-up by: Glendell Docker CMA,  March 29, 2010 2:45 PM    Prescriptions: FIORICET 50-325-40 MG TABS [facility]) one tablet by mouth  every 8 hours as needed headache  #30 x 1   Entered by:   Glendell Docker CMA   Authorized by:   D. Thomos Lemons DO   Signed by:   Glendell Docker CMA on 03/29/2010   Method used:   Telephoned to ...       [facility] (retail)       [address]       [place], [place]  [id number]       Ph: [phone]       Fax: [phone]   RxID:   [id number]

## 2010-04-05 NOTE — H&P (Signed)
  NAME:  [name] [name], [name] [name]               ACCOUNT NO.:  [id number]  MEDICAL RECORD NO.:  [id number]           PATIENT TYPE:  I  LOCATION:  1235                         FACILITY:  [facility]  PHYSICIAN:  Petra Kuba, M.D.    DATE OF BIRTH:  Mar 27, 1925  DATE OF ADMISSION:  03/01/2010 DATE OF DISCHARGE:                             HISTORY & PHYSICAL   HISTORY:  The patient is admitted status post ERCP with sphincterotomy and balloon pull-through with persistent abdominal pain and nausea and vomiting.  Please see dictation for the procedure.  The procedure actually went smoothly without any pancreatic duct injections or any obvious problems.  She did have a large periampullary diverticula and some debris was removed from that as well.  Her ERCP was done for a history of recurrent pancreatitis and EUS last week which showed positive CBD stones.  PAST MEDICAL HISTORY:  Fairly extensive pertinent for: 1. COPD. 2. Congestive heart failure. 3. Chronic anemia. 4. Reflux. 5. Hiatal hernia. 6. History of pancreatitis and diverticulitis as well as hypertension. 7. Back pain. 8. Resolved colitis. 9. Spinal stimulator, chronic back pain. 10.She does use oxygen at night. 11.She has had multiple back surgeries as well as a cholecystectomy.  MEDICINES AT HOME: 1. Caltrate. 2. Aspirin. 3. Vitamin D. 4. Neurontin. 5. Omeprazole. 6. Hyoscyamine. 7. Iron. 8. Bystolic. 9. Vitamin C. 10.Dilaudid. 11.Phenergan. 12.Zofran. 13.Spiriva. 14.Symbicort. 15.Ventolin.  FAMILY HISTORY:  Noncontributory.  ALLERGIES: 1. CODEINE. 2. MORPHINE. 3. IV DYE.  SOCIAL HISTORY:  She quit smoking about 25-30 years ago.  Does not drink.  REVIEW OF SYSTEMS:  Preprocedure is pertinent for her chronic headache only, but no abdominal symptoms and her colitis had resolved.  POSTOP PHYSICAL EXAM:  VITAL SIGNS:  Stable, afebrile. LUNGS:  Clear. HEART:  Regular rate and rhythm. ABDOMEN:  Soft, nontender when I  left the endoscopy unit.  Preprocedure labs including CBC were normal.  ASSESSMENT: 1. Status post endoscopic retrograde cholangiopancreatogram,     sphincterotomy, and balloon pull-through, possibly one small stone     being removed with increased pain and nausea, vomiting,     questionable etiology. 2. Multiple medical problems.  PLAN:  Will observe her in the step-down setting and consult hospitalist to assist with medical problems.  In the meantime, will get a three-way abdomen and repeat labs.  In the meantime, will continue antibiotics and keep her comfortable with Dilaudid and Phenergan and Zofran as needed. Repeat labs in the morning with further workup and plans pending above.          ______________________________ Petra Kuba, M.D.     MEM/MEDQ  D:  03/01/2010  T:  03/01/2010  Job:  [id number]  cc:   Barbette Hair. [place], DO [address] [place], [place] [address]  Electronically Signed by Vida Rigger M.D. on 04/04/2010 07:58:38 AM

## 2010-04-05 NOTE — Op Note (Signed)
NAME:  [name] [name], [name] [name]               ACCOUNT NO.:  [id number]  MEDICAL RECORD NO.:  [id number]           PATIENT TYPE:  O  LOCATION:  WLEN                         FACILITY:  [facility]  PHYSICIAN:  Petra Kuba, M.D.    DATE OF BIRTH:  [birth year]  DATE OF PROCEDURE: DATE OF DISCHARGE:                              OPERATIVE REPORT   PROCEDURE:  Endoscopic retrograde cholangiopancreatography, sphincterotomy, and stone extraction.  INDICATIONS:  Patient with CBD stones on endoscopic ultrasound as well as history of a few bouts of pancreatitis.  Consent was signed after risks, benefits, methods, options, thoroughly discussed multiple times in the past with both the patient and multiple family members. Medicines used, 300 mg of propofol, 100 mcg of fentanyl.  PROCEDURE:  The side-viewing therapeutic video duodenoscope was inserted by indirect vision into the stomach and advanced through a normal antrum and pylorus.  She did have some atrophic gastritis seen on insertion. We advanced into the duodenum and 2 periampullary diverticula were brought into view.  One was small, one was large.  The ampulla was at the 6 o'clock position on the larger diverticulum.  Using the triple- lumen sphincterotome loaded with a JAG-wire, deep selective cannulation was readily obtained.  There were no PD injections or wire advancements throughout the procedure.  Once deep selective cannulation was obtained, the wire was advanced into the intrahepatic and dye was injected, no obvious stone was seen.  The CBD was slightly dilated.  We went ahead and proceeded with a medium large sphincterotomy in the customary fashion with excellent biliary drainage.  There was a little bit of heme oozing from the sphincterotomy site which stopped by the end of the procedure. We were able to advance a fully bowed sphincterotome easily in and out of the duct.  We then exchanged the sphincterotome with a 12- 15 mm adjustable  balloon and proceeded with multiple balloon pull through.  We did think one stone was removed during this maneuver.  We then went ahead and proceeded with an occlusion cholangiogram which did not show any residual stones.  The balloon pulled readily through the patent sphincterotomy site and again there was excellent biliary drainage at this juncture.  After we had withdrawn the balloon the first time not mentioned above, we did advance it externally up against the sphincterotomy site and held it in place for 30 seconds which did seem to help the bleeding.  The sphincterotomy site at this juncture was washed and watched and was not bleeding.  She did have a large amount of debris in the large diverticulum which could not be washed down.  We initially advanced the sphincterotome and tried to bow it around it or get the debris in-between the wire and the sphincterotome but were unsuccessful but we did carefully advance the snare and snare the large piece and withdrawn into the duodenum.  We did cut the piece into 4 to 5 pieces with the snare and allowed it to pass.  Again the tick and the sphincterotomy site was washed and watched, there was no signs of bleeding.  We elected to slowly withdrawn.  Air and water were suctioned.  There was no obvious immediate complication.  Scope was removed.  The patient tolerated the procedure well.  ENDOSCOPIC DIAGNOSES: 1. Atrophic gastritis. 2. One small, one larger periampullary diverticulum. 3. No PD injections. 4. Slight dilated common bile duct without any obvious stones seen on     cholangiogram. 5. Status post medium large sphincterotomy and multiple 12-15 mm     adjustable balloon pull through withdrawn at 12 mm without any     resistance through the patent sphincterotomy site and probably 1     stone being delivered. 6. Excellent drainage and negative occlusion cholangiogram at the end     of the procedure. 7. Removed debris from the tick  with the snare and broke into the     pieces.  PLAN:  See how she does clinically.  Follow up p.r.n. or in 1 month. Hold aspirin for 5 days because of her minimal oozing.          ______________________________ Petra Kuba, M.D.     MEM/MEDQ  D:  03/01/2010  T:  03/01/2010  Job:  [id number]  cc:   Barbette Hair. [place], DO [address] [place], [place] [address]  Electronically Signed by Vida Rigger M.D. on 04/04/2010 07:58:35 AM

## 2010-04-08 LAB — CARDIAC PANEL(CRET KIN+CKTOT+MB+TROPI)
CK, MB: 2.9 ng/mL (ref 0.3–4.0)
CK, MB: 3.8 ng/mL (ref 0.3–4.0)
CK, MB: 3.8 ng/mL (ref 0.3–4.0)
Relative Index: INVALID (ref 0.0–2.5)
Relative Index: INVALID (ref 0.0–2.5)
Relative Index: INVALID (ref 0.0–2.5)
Total CK: 18 U/L (ref 7–177)
Total CK: 29 U/L (ref 7–177)
Total CK: 8 U/L (ref 7–177)
Troponin I: 0.06 ng/mL (ref 0.00–0.06)
Troponin I: 0.12 ng/mL — ABNORMAL HIGH (ref 0.00–0.06)
Troponin I: 0.2 ng/mL — ABNORMAL HIGH (ref 0.00–0.06)

## 2010-04-08 LAB — CBC
HCT: 35.9 % — ABNORMAL LOW (ref 36.0–46.0)
HCT: 43.5 % (ref 36.0–46.0)
HCT: 46.8 % — ABNORMAL HIGH (ref 36.0–46.0)
Hemoglobin: 11.6 g/dL — ABNORMAL LOW (ref 12.0–15.0)
Hemoglobin: 14.1 g/dL (ref 12.0–15.0)
Hemoglobin: 15.3 g/dL — ABNORMAL HIGH (ref 12.0–15.0)
MCH: 30.1 pg (ref 26.0–34.0)
MCH: 30.2 pg (ref 26.0–34.0)
MCH: 30.3 pg (ref 26.0–34.0)
MCHC: 32.3 g/dL (ref 30.0–36.0)
MCHC: 32.4 g/dL (ref 30.0–36.0)
MCHC: 32.7 g/dL (ref 30.0–36.0)
MCV: 92.3 fL (ref 78.0–100.0)
MCV: 93.2 fL (ref 78.0–100.0)
MCV: 93.3 fL (ref 78.0–100.0)
Platelets: 115 10*3/uL — ABNORMAL LOW (ref 150–400)
Platelets: 129 10*3/uL — ABNORMAL LOW (ref 150–400)
Platelets: 131 10*3/uL — ABNORMAL LOW (ref 150–400)
RBC: 3.85 MIL/uL — ABNORMAL LOW (ref 3.87–5.11)
RBC: 4.66 MIL/uL (ref 3.87–5.11)
RBC: 5.07 MIL/uL (ref 3.87–5.11)
RDW: 15.2 % (ref 11.5–15.5)
RDW: 15.3 % (ref 11.5–15.5)
RDW: 15.3 % (ref 11.5–15.5)
WBC: 4.9 10*3/uL (ref 4.0–10.5)
WBC: 8 10*3/uL (ref 4.0–10.5)
WBC: 9 10*3/uL (ref 4.0–10.5)

## 2010-04-08 LAB — URINE MICROSCOPIC-ADD ON

## 2010-04-08 LAB — APTT: aPTT: 30 seconds (ref 24–37)

## 2010-04-08 LAB — DIFFERENTIAL
Basophils Absolute: 0 10*3/uL (ref 0.0–0.1)
Basophils Absolute: 0 10*3/uL (ref 0.0–0.1)
Basophils Relative: 0 % (ref 0–1)
Basophils Relative: 0 % (ref 0–1)
Eosinophils Absolute: 0 10*3/uL (ref 0.0–0.7)
Eosinophils Absolute: 0 10*3/uL (ref 0.0–0.7)
Eosinophils Relative: 0 % (ref 0–5)
Eosinophils Relative: 0 % (ref 0–5)
Lymphocytes Relative: 6 % — ABNORMAL LOW (ref 12–46)
Lymphocytes Relative: 7 % — ABNORMAL LOW (ref 12–46)
Lymphs Abs: 0.5 10*3/uL — ABNORMAL LOW (ref 0.7–4.0)
Lymphs Abs: 0.5 10*3/uL — ABNORMAL LOW (ref 0.7–4.0)
Monocytes Absolute: 0.6 10*3/uL (ref 0.1–1.0)
Monocytes Absolute: 0.8 10*3/uL (ref 0.1–1.0)
Monocytes Relative: 7 % (ref 3–12)
Monocytes Relative: 9 % (ref 3–12)
Neutro Abs: 6.8 10*3/uL (ref 1.7–7.7)
Neutro Abs: 7.7 10*3/uL (ref 1.7–7.7)
Neutrophils Relative %: 85 % — ABNORMAL HIGH (ref 43–77)
Neutrophils Relative %: 86 % — ABNORMAL HIGH (ref 43–77)

## 2010-04-08 LAB — HEPATIC FUNCTION PANEL
ALT: 64 U/L — ABNORMAL HIGH (ref 0–35)
ALT: 87 U/L — ABNORMAL HIGH (ref 0–35)
AST: 22 U/L (ref 0–37)
AST: 30 U/L (ref 0–37)
Albumin: 2.3 g/dL — ABNORMAL LOW (ref 3.5–5.2)
Albumin: 2.4 g/dL — ABNORMAL LOW (ref 3.5–5.2)
Alkaline Phosphatase: 100 U/L (ref 39–117)
Alkaline Phosphatase: 90 U/L (ref 39–117)
Bilirubin, Direct: <0.1 mg/dL (ref 0.0–0.3)
Bilirubin, Direct: <0.1 mg/dL (ref 0.0–0.3)
Total Bilirubin: 0.2 mg/dL — ABNORMAL LOW (ref 0.3–1.2)
Total Bilirubin: 0.4 mg/dL (ref 0.3–1.2)
Total Protein: 4.9 g/dL — ABNORMAL LOW (ref 6.0–8.3)
Total Protein: 4.9 g/dL — ABNORMAL LOW (ref 6.0–8.3)

## 2010-04-08 LAB — POCT I-STAT, CHEM 8
BUN: 11 mg/dL (ref 6–23)
Calcium, Ion: 0.98 mmol/L — ABNORMAL LOW (ref 1.12–1.32)
Chloride: 97 mEq/L (ref 96–112)
Creatinine, Ser: 0.8 mg/dL (ref 0.4–1.2)
Glucose, Bld: 152 mg/dL — ABNORMAL HIGH (ref 70–99)
HCT: 46 % (ref 36.0–46.0)
Hemoglobin: 15.6 g/dL — ABNORMAL HIGH (ref 12.0–15.0)
Potassium: 3.5 mEq/L (ref 3.5–5.1)
Sodium: 133 mEq/L — ABNORMAL LOW (ref 135–145)
TCO2: 29 mmol/L (ref 0–100)

## 2010-04-08 LAB — URINALYSIS, ROUTINE W REFLEX MICROSCOPIC
Bilirubin Urine: NEGATIVE
Glucose, UA: NEGATIVE mg/dL
Ketones, ur: 15 mg/dL — AB
Leukocytes, UA: NEGATIVE
Nitrite: NEGATIVE
Protein, ur: NEGATIVE mg/dL
Specific Gravity, Urine: 1.013 (ref 1.005–1.030)
Urobilinogen, UA: 0.2 mg/dL (ref 0.0–1.0)
pH: 8.5 — ABNORMAL HIGH (ref 5.0–8.0)

## 2010-04-08 LAB — TROPONIN I: Troponin I: 0.15 ng/mL — ABNORMAL HIGH (ref 0.00–0.06)

## 2010-04-08 LAB — CROSSMATCH
ABO/RH(D): O POS
Antibody Screen: NEGATIVE

## 2010-04-08 LAB — COMPREHENSIVE METABOLIC PANEL
ALT: 110 U/L — ABNORMAL HIGH (ref 0–35)
ALT: 212 U/L — ABNORMAL HIGH (ref 0–35)
ALT: 327 U/L — ABNORMAL HIGH (ref 0–35)
AST: 113 U/L — ABNORMAL HIGH (ref 0–37)
AST: 229 U/L — ABNORMAL HIGH (ref 0–37)
AST: 42 U/L — ABNORMAL HIGH (ref 0–37)
Albumin: 2.3 g/dL — ABNORMAL LOW (ref 3.5–5.2)
Albumin: 2.8 g/dL — ABNORMAL LOW (ref 3.5–5.2)
Albumin: 3.7 g/dL (ref 3.5–5.2)
Alkaline Phosphatase: 122 U/L — ABNORMAL HIGH (ref 39–117)
Alkaline Phosphatase: 203 U/L — ABNORMAL HIGH (ref 39–117)
Alkaline Phosphatase: 217 U/L — ABNORMAL HIGH (ref 39–117)
BUN: 10 mg/dL (ref 6–23)
BUN: 10 mg/dL (ref 6–23)
BUN: 16 mg/dL (ref 6–23)
CO2: 24 mEq/L (ref 19–32)
CO2: 26 mEq/L (ref 19–32)
CO2: 28 mEq/L (ref 19–32)
Calcium: 7.3 mg/dL — ABNORMAL LOW (ref 8.4–10.5)
Calcium: 7.7 mg/dL — ABNORMAL LOW (ref 8.4–10.5)
Calcium: 8.9 mg/dL (ref 8.4–10.5)
Chloride: 94 mEq/L — ABNORMAL LOW (ref 96–112)
Chloride: 97 mEq/L (ref 96–112)
Chloride: 99 mEq/L (ref 96–112)
Creatinine, Ser: 0.69 mg/dL (ref 0.4–1.2)
Creatinine, Ser: 0.77 mg/dL (ref 0.4–1.2)
Creatinine, Ser: 0.99 mg/dL (ref 0.4–1.2)
GFR calc Af Amer: >60 mL/min (ref >60)
GFR calc Af Amer: >60 mL/min (ref >60)
GFR calc Af Amer: >60 mL/min (ref >60)
GFR calc non Af Amer: 54 mL/min — ABNORMAL LOW (ref >60)
GFR calc non Af Amer: >60 mL/min (ref >60)
GFR calc non Af Amer: >60 mL/min (ref >60)
Glucose, Bld: 148 mg/dL — ABNORMAL HIGH (ref 70–99)
Glucose, Bld: 151 mg/dL — ABNORMAL HIGH (ref 70–99)
Glucose, Bld: 84 mg/dL (ref 70–99)
Potassium: 3.2 mEq/L — ABNORMAL LOW (ref 3.5–5.1)
Potassium: 3.5 mEq/L (ref 3.5–5.1)
Potassium: 4 mEq/L (ref 3.5–5.1)
Sodium: 130 mEq/L — ABNORMAL LOW (ref 135–145)
Sodium: 134 mEq/L — ABNORMAL LOW (ref 135–145)
Sodium: 135 mEq/L (ref 135–145)
Total Bilirubin: 0.4 mg/dL (ref 0.3–1.2)
Total Bilirubin: 0.6 mg/dL (ref 0.3–1.2)
Total Bilirubin: 0.8 mg/dL (ref 0.3–1.2)
Total Protein: 5 g/dL — ABNORMAL LOW (ref 6.0–8.3)
Total Protein: 5.9 g/dL — ABNORMAL LOW (ref 6.0–8.3)
Total Protein: 7.2 g/dL (ref 6.0–8.3)

## 2010-04-08 LAB — IRON AND TIBC
Iron: 18 ug/dL — ABNORMAL LOW (ref 42–135)
Saturation Ratios: 5 % — ABNORMAL LOW (ref 20–55)
TIBC: 352 ug/dL (ref 250–470)
UIBC: 334 ug/dL

## 2010-04-08 LAB — PROTIME-INR
INR: 0.91 (ref 0.00–1.49)
Prothrombin Time: 12.5 seconds (ref 11.6–15.2)

## 2010-04-08 LAB — LACTIC ACID, PLASMA: Lactic Acid, Venous: 1.1 mmol/L (ref 0.5–2.2)

## 2010-04-08 LAB — LIPASE, BLOOD
Lipase: 15 U/L (ref 11–59)
Lipase: 19 U/L (ref 11–59)

## 2010-04-08 LAB — CULTURE, BLOOD (ROUTINE X 2)
Culture  Setup Time: 201112292335
Culture  Setup Time: 201112292335
Culture: NO GROWTH
Culture: NO GROWTH

## 2010-04-08 LAB — ABO/RH: ABO/RH(D): O POS

## 2010-04-08 LAB — URINE CULTURE
Colony Count: NO GROWTH
Culture  Setup Time: 201112291736
Culture: NO GROWTH

## 2010-04-08 LAB — OCCULT BLOOD, POC DEVICE: Fecal Occult Bld: POSITIVE

## 2010-04-08 LAB — HEPATITIS PANEL, ACUTE
HCV Ab: NEGATIVE
Hep A IgM: NEGATIVE
Hep B C IgM: NEGATIVE
Hepatitis B Surface Ag: NEGATIVE

## 2010-04-08 LAB — RETICULIN ANTIBODIES, IGA W TITER: Reticulin Ab, IgA: NEGATIVE

## 2010-04-08 LAB — CK TOTAL AND CKMB (NOT AT ARMC)
CK, MB: 3.4 ng/mL (ref 0.3–4.0)
Relative Index: INVALID (ref 0.0–2.5)
Total CK: 46 U/L (ref 7–177)

## 2010-04-08 LAB — ALPHA-1-ANTITRYPSIN: A-1 Antitrypsin, Ser: 233 mg/dL — ABNORMAL HIGH (ref 83–200)

## 2010-04-08 LAB — FERRITIN: Ferritin: 190 ng/mL (ref 10–291)

## 2010-04-08 LAB — TSH: TSH: 0.723 u[IU]/mL (ref 0.350–4.500)

## 2010-04-08 LAB — ANTI-NUCLEAR AB-TITER (ANA TITER): ANA Titer 1: 1:160 {titer} — ABNORMAL HIGH

## 2010-04-08 LAB — ANA: Anti Nuclear Antibody(ANA): POSITIVE — AB

## 2010-04-08 LAB — TISSUE TRANSGLUTAMINASE, IGA: Tissue Transglutaminase Ab, IgA: 15.8 U/mL (ref <20)

## 2010-04-08 LAB — CERULOPLASMIN: Ceruloplasmin: 40 mg/dL (ref 21–63)

## 2010-04-08 LAB — GLIADIN ANTIBODIES, SERUM
Gliadin IgA: 8.7 U/mL (ref <20)
Gliadin IgG: 2.7 U/mL (ref <20)

## 2010-04-09 LAB — URINALYSIS, ROUTINE W REFLEX MICROSCOPIC
Bilirubin Urine: NEGATIVE
Glucose, UA: NEGATIVE mg/dL
Ketones, ur: NEGATIVE mg/dL
Leukocytes, UA: NEGATIVE
Nitrite: NEGATIVE
Protein, ur: NEGATIVE mg/dL
Specific Gravity, Urine: 1.012 (ref 1.005–1.030)
Urobilinogen, UA: 0.2 mg/dL (ref 0.0–1.0)
pH: 8.5 — ABNORMAL HIGH (ref 5.0–8.0)

## 2010-04-09 LAB — COMPREHENSIVE METABOLIC PANEL
ALT: 15 U/L (ref 0–35)
ALT: 15 U/L (ref 0–35)
ALT: 17 U/L (ref 0–35)
AST: 21 U/L (ref 0–37)
AST: 21 U/L (ref 0–37)
AST: 27 U/L (ref 0–37)
Albumin: 2.4 g/dL — ABNORMAL LOW (ref 3.5–5.2)
Albumin: 2.9 g/dL — ABNORMAL LOW (ref 3.5–5.2)
Albumin: 3.6 g/dL (ref 3.5–5.2)
Alkaline Phosphatase: 60 U/L (ref 39–117)
Alkaline Phosphatase: 67 U/L (ref 39–117)
Alkaline Phosphatase: 98 U/L (ref 39–117)
BUN: 10 mg/dL (ref 6–23)
BUN: 12 mg/dL (ref 6–23)
BUN: 4 mg/dL — ABNORMAL LOW (ref 6–23)
CO2: 29 mEq/L (ref 19–32)
CO2: 30 mEq/L (ref 19–32)
CO2: 31 mEq/L (ref 19–32)
Calcium: 7.1 mg/dL — ABNORMAL LOW (ref 8.4–10.5)
Calcium: 7.9 mg/dL — ABNORMAL LOW (ref 8.4–10.5)
Calcium: 9 mg/dL (ref 8.4–10.5)
Chloride: 104 mEq/L (ref 96–112)
Chloride: 105 mEq/L (ref 96–112)
Chloride: 99 mEq/L (ref 96–112)
Creatinine, Ser: 0.64 mg/dL (ref 0.4–1.2)
Creatinine, Ser: 0.64 mg/dL (ref 0.4–1.2)
Creatinine, Ser: 0.83 mg/dL (ref 0.4–1.2)
GFR calc Af Amer: >60 mL/min (ref >60)
GFR calc Af Amer: >60 mL/min (ref >60)
GFR calc Af Amer: >60 mL/min (ref >60)
GFR calc non Af Amer: >60 mL/min (ref >60)
GFR calc non Af Amer: >60 mL/min (ref >60)
GFR calc non Af Amer: >60 mL/min (ref >60)
Glucose, Bld: 100 mg/dL — ABNORMAL HIGH (ref 70–99)
Glucose, Bld: 104 mg/dL — ABNORMAL HIGH (ref 70–99)
Glucose, Bld: 146 mg/dL — ABNORMAL HIGH (ref 70–99)
Potassium: 3.5 mEq/L (ref 3.5–5.1)
Potassium: 3.7 mEq/L (ref 3.5–5.1)
Potassium: 3.9 mEq/L (ref 3.5–5.1)
Sodium: 138 mEq/L (ref 135–145)
Sodium: 139 mEq/L (ref 135–145)
Sodium: 139 mEq/L (ref 135–145)
Total Bilirubin: 0.3 mg/dL (ref 0.3–1.2)
Total Bilirubin: 0.4 mg/dL (ref 0.3–1.2)
Total Bilirubin: 0.4 mg/dL (ref 0.3–1.2)
Total Protein: 4.9 g/dL — ABNORMAL LOW (ref 6.0–8.3)
Total Protein: 5.5 g/dL — ABNORMAL LOW (ref 6.0–8.3)
Total Protein: 7 g/dL (ref 6.0–8.3)

## 2010-04-09 LAB — CBC
HCT: 32.7 % — ABNORMAL LOW (ref 36.0–46.0)
HCT: 33.2 % — ABNORMAL LOW (ref 36.0–46.0)
HCT: 38.4 % (ref 36.0–46.0)
HCT: 43.6 % (ref 36.0–46.0)
Hemoglobin: 10.4 g/dL — ABNORMAL LOW (ref 12.0–15.0)
Hemoglobin: 10.5 g/dL — ABNORMAL LOW (ref 12.0–15.0)
Hemoglobin: 12.2 g/dL (ref 12.0–15.0)
Hemoglobin: 13.9 g/dL (ref 12.0–15.0)
MCH: 29.5 pg (ref 26.0–34.0)
MCH: 29.5 pg (ref 26.0–34.0)
MCH: 29.6 pg (ref 26.0–34.0)
MCH: 29.7 pg (ref 26.0–34.0)
MCHC: 31.3 g/dL (ref 30.0–36.0)
MCHC: 31.8 g/dL (ref 30.0–36.0)
MCHC: 31.9 g/dL (ref 30.0–36.0)
MCHC: 32.1 g/dL (ref 30.0–36.0)
MCV: 92.4 fL (ref 78.0–100.0)
MCV: 92.8 fL (ref 78.0–100.0)
MCV: 93 fL (ref 78.0–100.0)
MCV: 94.1 fL (ref 78.0–100.0)
Platelets: 158 10*3/uL (ref 150–400)
Platelets: 164 10*3/uL (ref 150–400)
Platelets: 216 10*3/uL (ref 150–400)
Platelets: 218 10*3/uL (ref 150–400)
RBC: 3.53 MIL/uL — ABNORMAL LOW (ref 3.87–5.11)
RBC: 3.54 MIL/uL — ABNORMAL LOW (ref 3.87–5.11)
RBC: 4.13 MIL/uL (ref 3.87–5.11)
RBC: 4.7 MIL/uL (ref 3.87–5.11)
RDW: 14 % (ref 11.5–15.5)
RDW: 14 % (ref 11.5–15.5)
RDW: 14.2 % (ref 11.5–15.5)
RDW: 14.7 % (ref 11.5–15.5)
WBC: 12.4 10*3/uL — ABNORMAL HIGH (ref 4.0–10.5)
WBC: 12.5 10*3/uL — ABNORMAL HIGH (ref 4.0–10.5)
WBC: 4.7 10*3/uL (ref 4.0–10.5)
WBC: 5.7 10*3/uL (ref 4.0–10.5)

## 2010-04-09 LAB — DIFFERENTIAL
Basophils Absolute: 0 10*3/uL (ref 0.0–0.1)
Basophils Absolute: 0 10*3/uL (ref 0.0–0.1)
Basophils Relative: 0 % (ref 0–1)
Basophils Relative: 0 % (ref 0–1)
Eosinophils Absolute: 0 10*3/uL (ref 0.0–0.7)
Eosinophils Absolute: 0 10*3/uL (ref 0.0–0.7)
Eosinophils Relative: 0 % (ref 0–5)
Eosinophils Relative: 0 % (ref 0–5)
Lymphocytes Relative: 4 % — ABNORMAL LOW (ref 12–46)
Lymphocytes Relative: 6 % — ABNORMAL LOW (ref 12–46)
Lymphs Abs: 0.4 10*3/uL — ABNORMAL LOW (ref 0.7–4.0)
Lymphs Abs: 0.8 10*3/uL (ref 0.7–4.0)
Monocytes Absolute: 0.8 10*3/uL (ref 0.1–1.0)
Monocytes Absolute: 0.9 10*3/uL (ref 0.1–1.0)
Monocytes Relative: 6 % (ref 3–12)
Monocytes Relative: 7 % (ref 3–12)
Neutro Abs: 10.8 10*3/uL — ABNORMAL HIGH (ref 1.7–7.7)
Neutro Abs: 11.2 10*3/uL — ABNORMAL HIGH (ref 1.7–7.7)
Neutrophils Relative %: 87 % — ABNORMAL HIGH (ref 43–77)
Neutrophils Relative %: 90 % — ABNORMAL HIGH (ref 43–77)

## 2010-04-09 LAB — BASIC METABOLIC PANEL
BUN: 8 mg/dL (ref 6–23)
CO2: 29 mEq/L (ref 19–32)
Calcium: 7.9 mg/dL — ABNORMAL LOW (ref 8.4–10.5)
Chloride: 102 mEq/L (ref 96–112)
Creatinine, Ser: 0.8 mg/dL (ref 0.4–1.2)
GFR calc Af Amer: >60 mL/min (ref >60)
GFR calc non Af Amer: >60 mL/min (ref >60)
Glucose, Bld: 139 mg/dL — ABNORMAL HIGH (ref 70–99)
Potassium: 4.4 mEq/L (ref 3.5–5.1)
Sodium: 137 mEq/L (ref 135–145)

## 2010-04-09 LAB — FERRITIN: Ferritin: 129 ng/mL (ref 10–291)

## 2010-04-09 LAB — POCT CARDIAC MARKERS
CKMB, poc: 1.1 ng/mL (ref 1.0–8.0)
Myoglobin, poc: 109 ng/mL (ref 12–200)
Troponin i, poc: <0.05 ng/mL (ref 0.00–0.09)

## 2010-04-09 LAB — AMYLASE
Amylase: 19 U/L (ref 0–105)
Amylase: 21 U/L (ref 0–105)

## 2010-04-09 LAB — SEDIMENTATION RATE: Sed Rate: 44 mm/hr — ABNORMAL HIGH (ref 0–22)

## 2010-04-09 LAB — CEA: CEA: 1.1 ng/mL (ref 0.0–5.0)

## 2010-04-09 LAB — LIPASE, BLOOD
Lipase: 18 U/L (ref 11–59)
Lipase: 19 U/L (ref 11–59)
Lipase: 21 U/L (ref 11–59)
Lipase: 21 U/L (ref 11–59)

## 2010-04-09 LAB — VITAMIN B12: Vitamin B-12: 410 pg/mL (ref 211–911)

## 2010-04-09 LAB — URINE MICROSCOPIC-ADD ON

## 2010-04-09 LAB — IRON AND TIBC
Iron: 26 ug/dL — ABNORMAL LOW (ref 42–135)
Saturation Ratios: 8 % — ABNORMAL LOW (ref 20–55)
TIBC: 315 ug/dL (ref 250–470)
UIBC: 289 ug/dL

## 2010-04-09 LAB — CANCER ANTIGEN 19-9: CA 19-9: 11.7 U/mL — ABNORMAL LOW (ref <35.0)

## 2010-04-09 LAB — FOLATE RBC: RBC Folate: 1011 ng/mL — ABNORMAL HIGH (ref 180–600)

## 2010-04-09 LAB — HEMOCCULT GUIAC POC 1CARD (OFFICE): Fecal Occult Bld: POSITIVE

## 2010-04-09 LAB — MAGNESIUM: Magnesium: 2 mg/dL (ref 1.5–2.5)

## 2010-04-09 NOTE — Assessment & Plan Note (Signed)
Summary: 6 week follow up/mhf   Vital Signs:  Patient profile:   77 year old female Height:      61 inches Weight:      121 pounds BMI:     22.95 O2 Sat:      94 % on Room air Temp:     97.9 degrees F oral Pulse rate:   78 / minute Resp:     22 per minute BP sitting:   100 / 60  (left arm) Cuff size:   regular  Vitals Entered By: Glendell Docker CMA (March 19, 2010 2:57 PM)  O2 Flow:  Room air CC: 6 Week follow up Is Patient Diabetic? No Pain Assessment Patient in pain? no      Comments decrease in appetite, refill on Fioricet, discuss cholesterol medications   Primary Care Provider:  [name] DO  CC:  6 Week follow up.  History of Present Illness: 77 yo female for f/u pt seen by GI and treated for  choledocholithiasis, status post endoscopic retrograde     cholangiopancreatography, sphincterotomy, and stone extraction on  February 3.  despite procedure - pt still having poor appetite and intermittent abdominal pain  Preventive Screening-Counseling & Management  Alcohol-Tobacco     Smoking Status: quit  Allergies: 1)  ! Codeine 2)  ! Morphine 3)  ! * Reclast 4)  ! * Contrast Dye  Past History:  Past Medical History: OSTEOPOROSIS -  Reclast rx 03/07/09 OSTEOARTHRITIS (ICD-715.90)   HIATAL HERNIA (ICD-553.3) GERD (ICD-530.81)    PNEUMONIA (ICD-486) COPD (ICD-496)  HYPOTENSION (ICD-458.9)  PEPTIC ULCER DISEASE (ICD-533.90) CARDIAC ARRHYTHMIA (ICD-427.9) HYPERCHOLESTEROLEMIA (ICD-272.0)   HEART FAILURE (ICD-428.9) HYPERTENSION (ICD-401.9) Hx of Spondylolysis with intractable back pain    Past Surgical History: gallbladder - 2008  hysterectomy - 1970s back surgeries - 1970s, 1995, 2007   neck surgery - 1970s  spinal cord stimulator       Physical Exam  General:  alert.   Head:  normocephalic and atraumatic.   Mouth:  pharynx pink and moist.   Lungs:  normal respiratory effort and normal breath sounds.   Heart:  normal rate, regular  rhythm, and no gallop.   Abdomen:  soft, non-tender, and normal bowel sounds.  no masses, no guarding, and no rigidity.     Impression & Recommendations:  Problem # 1:  PANCREATIC INSUFFICIENCY (ICD-577.8) Assessment Unchanged pt still having symptmos despite ERCP , sphincterotomy, and stone extraction on   February 3. question symptoms related to pancreatic insuff.  trial of pancreatic enzymes  Problem # 2:  HYPERTENSION (ICD-401.9) Assessment: Unchanged  Her updated medication list for this problem includes:    Bystolic 5 Mg Tabs (Nebivolol hcl) ..... One half by mouth once daily  BP today: 100/60 Prior BP: 110/62 (02/21/2010)  Labs Reviewed: K+: 4.2 (02/07/2010) Creat: : 0.67 (02/07/2010)   Chol: 151 (04/09/2009)   HDL: 39 (04/09/2009)   LDL: 87 (04/09/2009)   TG: 125 (04/09/2009)  Complete Medication List: 1)  Bystolic 5 Mg Tabs (Nebivolol hcl) .... One half by mouth once daily 2)  Omeprazole 40 Mg Cpdr (Omeprazole) .... One by mouth two times a day 30 min before meals 3)  Bayer Low Strength 81 Mg Tbec (Aspirin) .... Take 1 tablet by mouth once a day 4)  Spiriva Handihaler 18 Mcg Caps (Tiotropium bromide monohydrate) .... Inhale contents of 1 capsule once a day 5)  Symbicort 160-4.5 Mcg/act Aero (Budesonide-formoterol fumarate) .... Inhale 2 puffs two times a day  6)  Caltrate 600+d Plus 600-400 Mg-unit Tabs (Calcium carbonate-vit d-min) .... One by mouth two times a day with meals 7)  Dilaudid Pump  .... Continuous 8)  Aerochamber Mv Misc (Spacer/aero-holding chambers) .... Use with symbicort 9)  Ventolin Hfa 108 (90 Base) Mcg/act Aers (Albuterol sulfate) .[name].. 1 puff by mouth three times a day as needed 10)  Gabapentin 100 Mg Caps (Gabapentin) .... One by mouth three times a day 11)  Oxygen  .... 2l at night 12)  Flutter Devi (Respiratory therapy supplies) .... Use 4 times daily 13)  Vitamin D (ergocalciferol) 50000 Unit Caps (Ergocalciferol) .... One tablet by mouth once  weekly 14)  Tandem 162-115.2 Mg Caps (Ferrous fum-iron polysacch) .... Take 1 capsule by mouth once a day 15)  Fioricet 50-325-40 Mg Tabs (Butalbital-apap-caffeine) .... One tablet by mouth  every 8 hours as needed headache 16)  Vitamin C 500 Mg Tabs (Ascorbic acid) .... Take 1 tablet by mouth once a day 17)  Dilaudid 2 Mg Tabs (Hydromorphone hcl) .... Take 1 tablet by mouth once a day as needed 18)  Creon 24000 Unit Cpep (Pancrelipase (lip-prot-amyl)) .... 2 caps by mouth three to four times per day before meals and snacks  Patient Instructions: 1)  Please schedule a follow-up appointment in 2 months. 2)  BMP prior to visit, ICD-9:  401.9 3)  Lipid Panel prior to visit, ICD-9: 272.4 4)  Please return for lab work one (1) week before your next appointment.  Prescriptions: CREON 24000 UNIT CPEP (PANCRELIPASE (LIP-PROT-AMYL)) 2 caps by mouth three to four times per day before meals and snacks  #240 x 2   Entered and Authorized by:   D. Thomos Lemons DO   Signed by:   D. Thomos Lemons DO on 03/19/2010   Method used:   Electronically to        [facility] (retail)       [phone]       [place], [place]  [id number]       Ph: [phone]       Fax: [phone]   RxID:   [id number]    Orders Added: 1)  Est. Patient Level III [02725]    Current Allergies (reviewed today): ! CODEINE ! MORPHINE ! * RECLAST ! * CONTRAST DYE

## 2010-04-11 ENCOUNTER — Ambulatory Visit (INDEPENDENT_AMBULATORY_CARE_PROVIDER_SITE_OTHER): Payer: Medicare Other | Admitting: Critical Care Medicine

## 2010-04-11 ENCOUNTER — Encounter: Payer: Self-pay | Admitting: Critical Care Medicine

## 2010-04-11 DIAGNOSIS — R05 Cough: Secondary | ICD-10-CM

## 2010-04-11 DIAGNOSIS — R059 Cough, unspecified: Secondary | ICD-10-CM

## 2010-04-11 DIAGNOSIS — J4489 Other specified chronic obstructive pulmonary disease: Secondary | ICD-10-CM

## 2010-04-11 DIAGNOSIS — J449 Chronic obstructive pulmonary disease, unspecified: Secondary | ICD-10-CM

## 2010-04-12 ENCOUNTER — Telehealth: Payer: Self-pay | Admitting: Internal Medicine

## 2010-04-15 LAB — PROTIME-INR
INR: 1.02 (ref 0.00–1.49)
INR: 1.02 (ref 0.00–1.49)
Prothrombin Time: 13.3 seconds (ref 11.6–15.2)
Prothrombin Time: 13.3 seconds (ref 11.6–15.2)

## 2010-04-15 LAB — ACETAMINOPHEN LEVEL: Acetaminophen (Tylenol), Serum: <10 ug/mL — ABNORMAL LOW (ref 10–30)

## 2010-04-15 LAB — CULTURE, BLOOD (ROUTINE X 2)
Culture: NO GROWTH
Culture: NO GROWTH

## 2010-04-15 LAB — BASIC METABOLIC PANEL
BUN: 13 mg/dL (ref 6–23)
CO2: 31 mEq/L (ref 19–32)
Calcium: 7.9 mg/dL — ABNORMAL LOW (ref 8.4–10.5)
Chloride: 101 mEq/L (ref 96–112)
Creatinine, Ser: 0.84 mg/dL (ref 0.4–1.2)
GFR calc Af Amer: >60 mL/min (ref >60)
GFR calc non Af Amer: >60 mL/min (ref >60)
Glucose, Bld: 122 mg/dL — ABNORMAL HIGH (ref 70–99)
Potassium: 4.1 mEq/L (ref 3.5–5.1)
Sodium: 139 mEq/L (ref 135–145)

## 2010-04-15 LAB — URINALYSIS, ROUTINE W REFLEX MICROSCOPIC
Bilirubin Urine: NEGATIVE
Glucose, UA: NEGATIVE mg/dL
Ketones, ur: 15 mg/dL — AB
Leukocytes, UA: NEGATIVE
Nitrite: NEGATIVE
Protein, ur: 30 mg/dL — AB
Specific Gravity, Urine: 1.016 (ref 1.005–1.030)
Urobilinogen, UA: 0.2 mg/dL (ref 0.0–1.0)
pH: 5.5 (ref 5.0–8.0)

## 2010-04-15 LAB — COMPREHENSIVE METABOLIC PANEL
ALT: 30 U/L (ref 0–35)
ALT: 55 U/L — ABNORMAL HIGH (ref 0–35)
AST: 31 U/L (ref 0–37)
AST: 56 U/L — ABNORMAL HIGH (ref 0–37)
Albumin: 2.5 g/dL — ABNORMAL LOW (ref 3.5–5.2)
Albumin: 3.5 g/dL (ref 3.5–5.2)
Alkaline Phosphatase: 173 U/L — ABNORMAL HIGH (ref 39–117)
Alkaline Phosphatase: 99 U/L (ref 39–117)
BUN: 12 mg/dL (ref 6–23)
BUN: 8 mg/dL (ref 6–23)
CO2: 29 mEq/L (ref 19–32)
CO2: 33 mEq/L — ABNORMAL HIGH (ref 19–32)
Calcium: 7.6 mg/dL — ABNORMAL LOW (ref 8.4–10.5)
Calcium: 8.6 mg/dL (ref 8.4–10.5)
Chloride: 104 mEq/L (ref 96–112)
Chloride: 98 mEq/L (ref 96–112)
Creatinine, Ser: 0.7 mg/dL (ref 0.4–1.2)
Creatinine, Ser: 0.85 mg/dL (ref 0.4–1.2)
GFR calc Af Amer: >60 mL/min (ref >60)
GFR calc Af Amer: >60 mL/min (ref >60)
GFR calc non Af Amer: >60 mL/min (ref >60)
GFR calc non Af Amer: >60 mL/min (ref >60)
Glucose, Bld: 112 mg/dL — ABNORMAL HIGH (ref 70–99)
Glucose, Bld: 139 mg/dL — ABNORMAL HIGH (ref 70–99)
Potassium: 3.3 mEq/L — ABNORMAL LOW (ref 3.5–5.1)
Potassium: 3.8 mEq/L (ref 3.5–5.1)
Sodium: 136 mEq/L (ref 135–145)
Sodium: 140 mEq/L (ref 135–145)
Total Bilirubin: 0.4 mg/dL (ref 0.3–1.2)
Total Bilirubin: 0.5 mg/dL (ref 0.3–1.2)
Total Protein: 5.4 g/dL — ABNORMAL LOW (ref 6.0–8.3)
Total Protein: 7 g/dL (ref 6.0–8.3)

## 2010-04-15 LAB — CBC
HCT: 32.9 % — ABNORMAL LOW (ref 36.0–46.0)
HCT: 38.6 % (ref 36.0–46.0)
Hemoglobin: 11.1 g/dL — ABNORMAL LOW (ref 12.0–15.0)
Hemoglobin: 12.8 g/dL (ref 12.0–15.0)
MCHC: 33 g/dL (ref 30.0–36.0)
MCHC: 33.7 g/dL (ref 30.0–36.0)
MCV: 89.8 fL (ref 78.0–100.0)
MCV: 90.1 fL (ref 78.0–100.0)
Platelets: 153 10*3/uL (ref 150–400)
Platelets: 174 10*3/uL (ref 150–400)
RBC: 3.67 MIL/uL — ABNORMAL LOW (ref 3.87–5.11)
RBC: 4.29 MIL/uL (ref 3.87–5.11)
RDW: 16 % — ABNORMAL HIGH (ref 11.5–15.5)
RDW: 16.3 % — ABNORMAL HIGH (ref 11.5–15.5)
WBC: 7.9 10*3/uL (ref 4.0–10.5)
WBC: 9.7 10*3/uL (ref 4.0–10.5)

## 2010-04-15 LAB — CARDIAC PANEL(CRET KIN+CKTOT+MB+TROPI)
CK, MB: 5.2 ng/mL — ABNORMAL HIGH (ref 0.3–4.0)
CK, MB: 5.2 ng/mL — ABNORMAL HIGH (ref 0.3–4.0)
CK, MB: 6.7 ng/mL (ref 0.3–4.0)
Relative Index: 2.8 — ABNORMAL HIGH (ref 0.0–2.5)
Relative Index: 3.1 — ABNORMAL HIGH (ref 0.0–2.5)
Relative Index: 3.6 — ABNORMAL HIGH (ref 0.0–2.5)
Total CK: 143 U/L (ref 7–177)
Total CK: 167 U/L (ref 7–177)
Total CK: 238 U/L — ABNORMAL HIGH (ref 7–177)
Troponin I: 0.06 ng/mL (ref 0.00–0.06)
Troponin I: 0.06 ng/mL (ref 0.00–0.06)
Troponin I: 0.1 ng/mL — ABNORMAL HIGH (ref 0.00–0.06)

## 2010-04-15 LAB — TRICYCLIC ANTIDEPRESSANT EVALUATION
Amitriptyline and Nortriptyline: <5 mcg/L — ABNORMAL LOW (ref 120–250)
Amitriptyline: <5 mcg/L
Clomipramine.: <5 ng/mL
Desemethylclomipramine: <5 ng/mL
Desipramine DPRAM: <5 mcg/L
Desmethyldoxepin: <5 mcg/L
Doxepin: <5 mcg/L
Imipramine IPRAM: <5 mcg/L
Imipramine and Desipramine: <5 mcg/L — ABNORMAL LOW (ref 150–300)
Nortriptyline NTRIP: <5 mcg/L
Tot Clomipramine+Desmethylclomipramine: NOT DETECTED not reported
Total Desmethyldoxepine+Doxepin: <5 mcg/L — ABNORMAL LOW (ref 100–250)

## 2010-04-15 LAB — DIFFERENTIAL
Basophils Absolute: 0 10*3/uL (ref 0.0–0.1)
Basophils Relative: 0 % (ref 0–1)
Eosinophils Absolute: 0 10*3/uL (ref 0.0–0.7)
Eosinophils Relative: 0 % (ref 0–5)
Lymphocytes Relative: 5 % — ABNORMAL LOW (ref 12–46)
Lymphs Abs: 0.5 10*3/uL — ABNORMAL LOW (ref 0.7–4.0)
Monocytes Absolute: 0.8 10*3/uL (ref 0.1–1.0)
Monocytes Relative: 8 % (ref 3–12)
Neutro Abs: 8.4 10*3/uL — ABNORMAL HIGH (ref 1.7–7.7)
Neutrophils Relative %: 87 % — ABNORMAL HIGH (ref 43–77)

## 2010-04-15 LAB — LIPID PANEL
Cholesterol: 132 mg/dL (ref 0–200)
HDL: 35 mg/dL — ABNORMAL LOW (ref >39)
LDL Cholesterol: 82 mg/dL (ref 0–99)
Total CHOL/HDL Ratio: 3.8 RATIO
Triglycerides: 74 mg/dL (ref <150)
VLDL: 15 mg/dL (ref 0–40)

## 2010-04-15 LAB — GLUCOSE, CAPILLARY: Glucose-Capillary: 138 mg/dL — ABNORMAL HIGH (ref 70–99)

## 2010-04-15 LAB — URINE CULTURE
Colony Count: NO GROWTH
Culture: NO GROWTH

## 2010-04-15 LAB — LACTIC ACID, PLASMA: Lactic Acid, Venous: 1.3 mmol/L (ref 0.5–2.2)

## 2010-04-15 LAB — MRSA PCR SCREENING: MRSA by PCR: NEGATIVE

## 2010-04-15 LAB — POCT CARDIAC MARKERS
CKMB, poc: 4.8 ng/mL (ref 1.0–8.0)
Myoglobin, poc: >500 ng/mL (ref 12–200)
Troponin i, poc: <0.05 ng/mL (ref 0.00–0.09)

## 2010-04-15 LAB — CK: Total CK: 202 U/L — ABNORMAL HIGH (ref 7–177)

## 2010-04-15 LAB — APTT: aPTT: 42 seconds — ABNORMAL HIGH (ref 24–37)

## 2010-04-15 LAB — SALICYLATE LEVEL: Salicylate Lvl: <4 mg/dL (ref 2.8–20.0)

## 2010-04-15 LAB — BRAIN NATRIURETIC PEPTIDE: Pro B Natriuretic peptide (BNP): 124 pg/mL — ABNORMAL HIGH (ref 0.0–100.0)

## 2010-04-15 LAB — URINE MICROSCOPIC-ADD ON

## 2010-04-15 LAB — DRUG SCREEN PANEL (SERUM)

## 2010-04-16 LAB — CBC
HCT: 35.1 % — ABNORMAL LOW (ref 36.0–46.0)
Hemoglobin: 12 g/dL (ref 12.0–15.0)
MCHC: 34.1 g/dL (ref 30.0–36.0)
MCV: 89.8 fL (ref 78.0–100.0)
Platelets: 178 10*3/uL (ref 150–400)
RBC: 3.91 MIL/uL (ref 3.87–5.11)
RDW: 13.8 % (ref 11.5–15.5)
WBC: 4.5 10*3/uL (ref 4.0–10.5)

## 2010-04-16 LAB — URINALYSIS, ROUTINE W REFLEX MICROSCOPIC
Glucose, UA: NEGATIVE mg/dL
Hgb urine dipstick: NEGATIVE
Ketones, ur: 15 mg/dL — AB
Nitrite: NEGATIVE
Protein, ur: NEGATIVE mg/dL
Specific Gravity, Urine: 1.019 (ref 1.005–1.030)
Urobilinogen, UA: 0.2 mg/dL (ref 0.0–1.0)
pH: 6 (ref 5.0–8.0)

## 2010-04-16 LAB — COMPREHENSIVE METABOLIC PANEL
ALT: 21 U/L (ref 0–35)
AST: 40 U/L — ABNORMAL HIGH (ref 0–37)
Albumin: 3.4 g/dL — ABNORMAL LOW (ref 3.5–5.2)
Alkaline Phosphatase: 78 U/L (ref 39–117)
BUN: 13 mg/dL (ref 6–23)
CO2: 29 mEq/L (ref 19–32)
Calcium: 7.5 mg/dL — ABNORMAL LOW (ref 8.4–10.5)
Chloride: 102 mEq/L (ref 96–112)
Creatinine, Ser: 0.8 mg/dL (ref 0.4–1.2)
GFR calc Af Amer: >60 mL/min (ref >60)
GFR calc non Af Amer: >60 mL/min (ref >60)
Glucose, Bld: 106 mg/dL — ABNORMAL HIGH (ref 70–99)
Potassium: 4.3 mEq/L (ref 3.5–5.1)
Sodium: 139 mEq/L (ref 135–145)
Total Bilirubin: 0.4 mg/dL (ref 0.3–1.2)
Total Protein: 6.5 g/dL (ref 6.0–8.3)

## 2010-04-16 LAB — DIFFERENTIAL
Basophils Absolute: 0 10*3/uL (ref 0.0–0.1)
Basophils Relative: 1 % (ref 0–1)
Eosinophils Absolute: 0 10*3/uL (ref 0.0–0.7)
Eosinophils Relative: 1 % (ref 0–5)
Lymphocytes Relative: 15 % (ref 12–46)
Lymphs Abs: 0.7 10*3/uL (ref 0.7–4.0)
Monocytes Absolute: 0.5 10*3/uL (ref 0.1–1.0)
Monocytes Relative: 12 % (ref 3–12)
Neutro Abs: 3.3 10*3/uL (ref 1.7–7.7)
Neutrophils Relative %: 71 % (ref 43–77)

## 2010-04-16 LAB — URINE MICROSCOPIC-ADD ON

## 2010-04-16 LAB — LIPASE, BLOOD: Lipase: 17 U/L — ABNORMAL LOW (ref 23–300)

## 2010-04-16 NOTE — Assessment & Plan Note (Signed)
Summary: Pulmonary OV   Primary Provider/Referring Provider:  [name] DO  CC:  2 mos followup, c/o increased sob with exertion, cough, hard to get it up, and no chest tightness or wheezing.  History of Present Illness: 77 yowf  July 19, 2009 11:58 AM This pt was admitted 6/10 -6/13 for RLL CAP and colitis of sigmoid.  All c/s data was neg. Pt had AMS.  She responded to flagyl and levaquin. Last CXR showed no PNA and CT chest essentially neg. Echo normal.   The diarrhea is now resolved.  Pt notes now bowels are better,  no diarrhea,  no n/v  no abd pain,   Pt still with cough and tightness in chest , occ prod mucus yellow,  no real pndrip.  No chest pain. No edema in feet,  but did have edema before.  Still with chronic headache.  Head hurts across forehead and to the side of the head.  The pt remains on oxygen at bedtime along with symbicort and spiriva.  October 04, 2009 1:33 PM Pt notes since 6/11.  having sore throat  started two weeks ago.   recent CT chest.   no real fever.  The pt si  still with cough and is congested. The mucus is yellow and gray. There is  no real chest pain.  The pt  notes some headaches.   THe pt is not dyspneic.  There is  no real edema.  The Head aches on front and top The pt was ecent rx with  cleocin A recent CT  chest  noted with LL tree in bud pattern ?MAC . CT sinus was normal  November 01, 2009 2:23 PM Dyspnea is the same, not as bad.  Not able to raise the mucus.  No real chest pain.  No sore throat. No f/c/s.  Notes some headaches.  Mucus is yellow not a gray.  Took abx became white and foamy. at home pulse ox is 94% January 03, 2010 11:50 AM Pt in hosp 11/6 - 12/07/09 for pancreatitis since d/c no cough,  no pain now.  occ pain in LUQ  but is better.   Notes more congestion and more dyspneic at night.  Occ heartburn with this .  Sleeps on two pillows. Bed is raised 6 inches. Has chronic headaches and to see neurology (2/12)  February 07, 2010 2:28 PM dx diarrhea and abd pain.  dx colitis ?etiol pt admitted for same  d/c on hyosciamine for nausea and abd pain LFTs higher dyspnea is worse than before .  notes some cough but something in the throat mucus off white no real chest pain. has indigestion no further diarrhea still with abd pain   February 21, 2010 2:03 PM at last ov we rec: rx flagyl and cipro to cover GI and lungs cough is less,  flutter valve helps.   GI MD saw the pt and shows stones in the biliary tree.  GB out for three years.  Problem since that time is now off all abx.   no chest pain.  low grade fever.  diarrhea is better.  no n/v  to have ercp for stones on 03/01/10 April 11, 2010 1:50 PM Had ERCP 2/12:  no stones seen.  Opened up bile duct.  abd pain is better,  no n/v.  Notes some cough.  Notes some mucus in the throat   Current Medications (verified): 1)  Bystolic 5 Mg Tabs (Nebivolol  Hcl) .... One Half By Mouth Once Daily 2)  Omeprazole 40 Mg Cpdr (Omeprazole) .... One By Mouth Two Times A Day 30 Min Before Meals 3)  Bayer Low Strength 81 Mg Tbec (Aspirin) .... Take 1 Tablet By Mouth Once A Day 4)  Spiriva Handihaler 18 Mcg Caps (Tiotropium Bromide Monohydrate) .... Inhale Contents of 1 Capsule Once A Day 5)  Symbicort 160-4.5 Mcg/act Aero (Budesonide-Formoterol Fumarate) .... Inhale 2 Puffs Two Times A Day 6)  Caltrate 600+d Plus 600-400 Mg-Unit Tabs (Calcium Carbonate-Vit D-Min) .... One By Mouth Two Times A Day With Meals 7)  Dilaudid Pump .... Continuous 8)  Aerochamber Mv  Misc (Spacer/aero-Holding Chambers) .... Use With Symbicort 9)  Ventolin Hfa 108 (90 Base) Mcg/act Aers (Albuterol Sulfate) .[name].. 1 Puff By Mouth Three Times A Day As Needed 10)  Gabapentin 100 Mg Caps (Gabapentin) .... One By Mouth Three Times A Day 11)  Oxygen .... 2l At Night 12)  Flutter  Devi (Respiratory Therapy Supplies) .... Use 4 Times Daily 13)  Vitamin D (Ergocalciferol) 50000 Unit Caps (Ergocalciferol) .... One  Tablet By Mouth Once Weekly 14)  Tandem 162-115.2 Mg Caps (Ferrous Fum-Iron Polysacch) .... Take 1 Capsule By Mouth Once A Day 15)  Fioricet 50-325-40 Mg Tabs (Butalbital-Apap-Caffeine) .... One Tablet By Mouth  Every 8 Hours As Needed Headache 16)  Vitamin C 500 Mg Tabs (Ascorbic Acid) .... Take 1 Tablet By Mouth Once A Day 17)  Dilaudid 2 Mg Tabs (Hydromorphone Hcl) .... Take 1 Tablet By Mouth Once A Day As Needed 18)  Creon 24000 Unit Cpep (Pancrelipase (Lip-Prot-Amyl)) .... 2 Caps By Mouth Three To Four Times Per Day Before Meals and Snacks  Allergies: 1)  ! Codeine 2)  ! Morphine 3)  ! * Reclast 4)  ! * Contrast Dye  Past History:  Past medical, surgical, family and social histories (including risk factors) reviewed for relevance to current acute and chronic problems.  Past Medical History: Reviewed history from 03/19/2010 and no changes required. OSTEOPOROSIS -  Reclast rx 03/07/09 OSTEOARTHRITIS (ICD-715.90)   HIATAL HERNIA (ICD-553.3) GERD (ICD-530.81)    PNEUMONIA (ICD-486) COPD (ICD-496)  HYPOTENSION (ICD-458.9)  PEPTIC ULCER DISEASE (ICD-533.90) CARDIAC ARRHYTHMIA (ICD-427.9) HYPERCHOLESTEROLEMIA (ICD-272.0)   HEART FAILURE (ICD-428.9) HYPERTENSION (ICD-401.9) Hx of Spondylolysis with intractable back pain    Past Surgical History: Reviewed history from 03/19/2010 and no changes required. gallbladder - 2008  hysterectomy - 1970s back surgeries - 1970s, 1995, 2007   neck surgery - 1970s  spinal cord stimulator       Past Pulmonary History:  Pulmonary History: Pulmonary Function Test  Date: 02/08/2009 Height (in.): 60 Gender: Female  Pre-Spirometry  FVC     Value: 1.48 L/min   Pred: 2.10 L/min     % Pred: 70 % FEV1     Value: 1.03 L     Pred: 1.38 L     % Pred: 74 % FEV1/FVC   Value: 69 %     Pred: 70 %     FEF 25-75   Value: 0.64 L/min   Pred: 1.72 L/min     % Pred: 37 %  Post-Spirometry  FVC     Value: 1.69 L/min   Pred: 2.10 L/min     % Pred: 80  % FEV1     Value: 1.25 L     Pred: 1.38 L     % Pred: 90 % FEV1/FVC   Value: 74 %     Pred: 70 %  FEF 25-75   Value: 0.89 L/min   Pred: 1.72 L/min     % Pred: 52 %  Lung Volumes  TLC     Value: 3.77 L   % Pred: 96 % RV     Value: 2.16 L   % Pred: 126 % DLCO     Value: 10.6 %   % Pred: 71 % DLCO/VA   Value: 3.31 %   % Pred: 101 %  Comments:  Moderate peripheral airflow obstruction ,  normal lung volumes,  mildly reduced DLCO  Family History: Reviewed history from 02/05/2010 and no changes required. heart disease - father cancer - mother [name] Family History Hypertension          Social History: Reviewed history from 02/05/2010 and no changes required. former smoker, quit in 1990, x58yrs, 1ppd. no alcohol widow    3 children   One grandchild at care link = Angelia lives with great-granddaughter    retired: worked for [employer] of [employer]  Review of Systems       The patient complains of shortness of breath with activity.  The patient denies shortness of breath at rest, productive cough, non-productive cough, coughing up blood, chest pain, irregular heartbeats, acid heartburn, indigestion, loss of appetite, weight change, abdominal pain, difficulty swallowing, sore throat, tooth/dental problems, headaches, nasal congestion/difficulty breathing through nose, sneezing, itching, ear ache, anxiety, depression, hand/feet swelling, joint stiffness or pain, rash, change in color of mucus, and fever.    Vital Signs:  Patient profile:   77 year old female Height:      61 inches Weight:      122 pounds BMI:     23.14 O2 Sat:      94 % on Room air Temp:     98.0 degrees F oral Pulse rate:   80 / minute BP sitting:   100 / 60  (right arm) Cuff size:   regular  Vitals Entered By: Kandice Hams CMA (April 11, 2010 1:36 PM)  O2 Flow:  Room air  Physical Exam  Additional Exam:  Gen: Pleasant, well-nourished, in no distress , normal affect ENT: no lesions, no post nasal drip Neck: No  JVD, no TMG, no carotid bruits Lungs: No use of accessory muscles, no dullness to percussion,  distant BS, decreased  exp wheeze RLL and LLL Cardiovascular: RRR, heart sounds normal, no murmurs or gallop, trace pretibial edema bilaterally Abdomen: diffusely tender, bsa, no masses or hsm Musculoskeletal: No deformities, no cyanosis or clubbing Neuro: alert, non-focal     Impression & Recommendations:  Problem # 1:  COPD (ICD-496) Assessment Unchanged  copd  improved plan No change in inhaled medications.   Maintain treatment program as currently prescribed.  Complete Medication List: 1)  Bystolic 5 Mg Tabs (Nebivolol hcl) .... One half by mouth once daily 2)  Omeprazole 40 Mg Cpdr (Omeprazole) .... One by mouth two times a day 30 min before meals 3)  Bayer Low Strength 81 Mg Tbec (Aspirin) .... Take 1 tablet by mouth once a day 4)  Spiriva Handihaler 18 Mcg Caps (Tiotropium bromide monohydrate) .... Inhale contents of 1 capsule once a day 5)  Symbicort 160-4.5 Mcg/act Aero (Budesonide-formoterol fumarate) .... Inhale 2 puffs two times a day 6)  Caltrate 600+d Plus 600-400 Mg-unit Tabs (Calcium carbonate-vit d-min) .... One by mouth two times a day with meals 7)  Dilaudid Pump  .... Continuous 8)  Aerochamber Mv Misc (Spacer/aero-holding chambers) .... Use with symbicort 9)  Ventolin Hfa  108 (90 Base) Mcg/act Aers (Albuterol sulfate) .[name].. 1 puff by mouth three times a day as needed 10)  Gabapentin 100 Mg Caps (Gabapentin) .... One by mouth three times a day 11)  Oxygen  .... 2l at night 12)  Flutter Devi (Respiratory therapy supplies) .... Use 4 times daily 13)  Vitamin D (ergocalciferol) 50000 Unit Caps (Ergocalciferol) .... One tablet by mouth once weekly 14)  Tandem 162-115.2 Mg Caps (Ferrous fum-iron polysacch) .... Take 1 capsule by mouth once a day 15)  Fioricet 50-325-40 Mg Tabs (Butalbital-apap-caffeine) .... One tablet by mouth  every 8 hours as needed headache 16)  Vitamin  C 500 Mg Tabs (Ascorbic acid) .... Take 1 tablet by mouth once a day 17)  Dilaudid 2 Mg Tabs (Hydromorphone hcl) .... Take 1 tablet by mouth once a day as needed 18)  Creon 24000 Unit Cpep (Pancrelipase (lip-prot-amyl)) .... 2 caps by mouth three to four times per day before meals and snacks  Other Orders: Est. Patient Level III (04540)  Patient Instructions: 1)  No change in medications 2)  Return in       4   months

## 2010-04-16 NOTE — Progress Notes (Signed)
Summary: would dr Artist Pais call in some percocet   Phone Note Call from Patient   Caller: Patient Call For: Ridwan Bondy  Summary of Call: she is trying not to use the dilauidid as much.  She had a few percocet from an old rx.  she tried one and it really helps pain.  Would Dr Artist Pais call her in an rx for this med.  [facility].  If the rx needs to be picked up please call grand daughter [phone] Initial call taken by: Roselle Locus,  April 12, 2010 12:26 PM  Follow-up for Phone Call        no she has to call her pain mgt specialist Follow-up by: D. Thomos Lemons DO,  April 12, 2010 4:29 PM  Additional Follow-up for Phone Call Additional follow up Details #1::        spoke with grand daughter and she will contact the pain management people Roselle Locus  April 12, 2010 4:36 PM

## 2010-04-29 LAB — POCT CARDIAC MARKERS
CKMB, poc: 3.3 ng/mL (ref 1.0–8.0)
Myoglobin, poc: 90.3 ng/mL (ref 12–200)
Troponin i, poc: <0.05 ng/mL (ref 0.00–0.09)

## 2010-04-29 LAB — URINALYSIS, MICROSCOPIC ONLY
Bilirubin Urine: NEGATIVE
Glucose, UA: 250 mg/dL — AB
Hgb urine dipstick: NEGATIVE
Ketones, ur: NEGATIVE mg/dL
Leukocytes, UA: NEGATIVE
Nitrite: NEGATIVE
Protein, ur: NEGATIVE mg/dL
Specific Gravity, Urine: 1.026 (ref 1.005–1.030)
Urobilinogen, UA: 0.2 mg/dL (ref 0.0–1.0)
pH: 6 (ref 5.0–8.0)

## 2010-04-29 LAB — CBC
HCT: 34 % — ABNORMAL LOW (ref 36.0–46.0)
HCT: 34.9 % — ABNORMAL LOW (ref 36.0–46.0)
Hemoglobin: 11.3 g/dL — ABNORMAL LOW (ref 12.0–15.0)
Hemoglobin: 11.9 g/dL — ABNORMAL LOW (ref 12.0–15.0)
MCHC: 33.1 g/dL (ref 30.0–36.0)
MCHC: 34.1 g/dL (ref 30.0–36.0)
MCV: 92.8 fL (ref 78.0–100.0)
MCV: 90.8 fL (ref 78.0–100.0)
Platelets: 179 10*3/uL (ref 150–400)
Platelets: 163 10*3/uL (ref 150–400)
RBC: 3.67 MIL/uL — ABNORMAL LOW (ref 3.87–5.11)
RBC: 3.85 MIL/uL — ABNORMAL LOW (ref 3.87–5.11)
RDW: 15.1 % (ref 11.5–15.5)
RDW: 14.1 % (ref 11.5–15.5)
WBC: 6.9 10*3/uL (ref 4.0–10.5)
WBC: 9.1 10*3/uL (ref 4.0–10.5)

## 2010-04-29 LAB — BASIC METABOLIC PANEL
BUN: 12 mg/dL (ref 6–23)
BUN: 19 mg/dL (ref 6–23)
CO2: 32 mEq/L (ref 19–32)
CO2: 31 mEq/L (ref 19–32)
Calcium: 8.1 mg/dL — ABNORMAL LOW (ref 8.4–10.5)
Calcium: 8.4 mg/dL (ref 8.4–10.5)
Chloride: 101 mEq/L (ref 96–112)
Chloride: 102 mEq/L (ref 96–112)
Creatinine, Ser: 0.63 mg/dL (ref 0.4–1.2)
Creatinine, Ser: 0.7 mg/dL (ref 0.4–1.2)
GFR calc Af Amer: >60 mL/min (ref >60)
GFR calc Af Amer: >60 mL/min (ref >60)
GFR calc non Af Amer: >60 mL/min (ref >60)
GFR calc non Af Amer: >60 mL/min (ref >60)
Glucose, Bld: 111 mg/dL — ABNORMAL HIGH (ref 70–99)
Glucose, Bld: 96 mg/dL (ref 70–99)
Potassium: 4.4 mEq/L (ref 3.5–5.1)
Potassium: 4.2 mEq/L (ref 3.5–5.1)
Sodium: 138 mEq/L (ref 135–145)
Sodium: 143 mEq/L (ref 135–145)

## 2010-04-29 LAB — DIFFERENTIAL
Basophils Absolute: 0.1 10*3/uL (ref 0.0–0.1)
Basophils Relative: 1 % (ref 0–1)
Eosinophils Absolute: 0.1 10*3/uL (ref 0.0–0.7)
Eosinophils Relative: 1 % (ref 0–5)
Lymphocytes Relative: 11 % — ABNORMAL LOW (ref 12–46)
Lymphs Abs: 1 10*3/uL (ref 0.7–4.0)
Monocytes Absolute: 0.5 10*3/uL (ref 0.1–1.0)
Monocytes Relative: 5 % (ref 3–12)
Neutro Abs: 7.4 10*3/uL (ref 1.7–7.7)
Neutrophils Relative %: 81 % — ABNORMAL HIGH (ref 43–77)

## 2010-04-29 LAB — URINE CULTURE
Colony Count: 5000
Special Requests: NEGATIVE

## 2010-04-29 LAB — POCT B-TYPE NATRIURETIC PEPTIDE (BNP): B Natriuretic Peptide, POC: 14.8 pg/mL (ref 0–100)

## 2010-04-29 LAB — HEMOCCULT GUIAC POC 1CARD (OFFICE): Fecal Occult Bld: NEGATIVE

## 2010-05-15 ENCOUNTER — Encounter: Payer: Self-pay | Admitting: Internal Medicine

## 2010-05-16 ENCOUNTER — Ambulatory Visit (INDEPENDENT_AMBULATORY_CARE_PROVIDER_SITE_OTHER): Payer: Medicare Other | Admitting: Internal Medicine

## 2010-05-16 ENCOUNTER — Encounter: Payer: Self-pay | Admitting: Internal Medicine

## 2010-05-16 DIAGNOSIS — M549 Dorsalgia, unspecified: Secondary | ICD-10-CM

## 2010-05-16 DIAGNOSIS — G8929 Other chronic pain: Secondary | ICD-10-CM

## 2010-05-16 DIAGNOSIS — K8689 Other specified diseases of pancreas: Secondary | ICD-10-CM

## 2010-05-16 NOTE — Patient Instructions (Addendum)
Please complete the following lab tests before your next follow up appointment: BMET - 401.9 Vit D level - 733.00 CBC - 285.9

## 2010-06-02 ENCOUNTER — Encounter: Payer: Self-pay | Admitting: Internal Medicine

## 2010-06-02 DIAGNOSIS — M549 Dorsalgia, unspecified: Secondary | ICD-10-CM | POA: Insufficient documentation

## 2010-06-02 DIAGNOSIS — G8929 Other chronic pain: Secondary | ICD-10-CM | POA: Insufficient documentation

## 2010-06-02 NOTE — Assessment & Plan Note (Signed)
No improvement in dyspepsia symptoms despite trial of pancreatic enzymes. Continue PPI bid  Pt advised to avoid heavy protein / fat containing meals

## 2010-06-02 NOTE — Progress Notes (Signed)
Subjective:    Patient ID: [name] [name], female    DOB: 08/28/1925, 77 y.o.   MRN: [id number]  HPI  77 y/o white female for follow up. At prev visit, pt tried on pancreatic enzymes to see if improvement in dyspepsia.  She has not noticed any improvement (even at higher dose).  Chronic back pain.  Pt experiencing some breakthrough back pain despite using dilaudid.  She is followed by pain mgt.   Review of Systems  Past Medical History  Diagnosis Date  . Osteoporosis   . Arthritis     osteoarthritis  . GERD (gastroesophageal reflux disease)   . Hiatal hernia   . COPD (chronic obstructive pulmonary disease)   . Pneumonia   . Hypotension   . PUD (peptic ulcer disease)   . Cardiac arrhythmia   . Hyperlipidemia   . Heart failure   . Hypertension   . Spondylolysis     history of intractable back pain  . Choledocholithiasis     History   Social History  . Marital Status: Widowed    Spouse Name: N/A    Number of Children: 3  . Years of Education: N/A   Occupational History  .     Social History Main Topics  . Smoking status: Former Smoker -- 1.0 packs/day for 12 years    Types: Cigarettes    Quit date: 01/28/1988  . Smokeless tobacco: Not on file  . Alcohol Use: No  . Drug Use: Not on file  . Sexually Active: Not on file   Other Topics Concern  . Not on file   Social History Narrative   1 grandchild at care link--AngeliaLives with great-granddaughter    Past Surgical History  Procedure Date  . Cholecystectomy 2008  . Abdominal hysterectomy 1970s  . Spine surgery [phone]  . Neck surgery 1970s  . Spinal cord stimulator implant   . Ercp w/ sphicterotomy 02/2010    Family History  Problem Relation Age of Onset  . Cancer Mother     uterine  . Heart disease Father   . Hypertension Other     Allergies  Allergen Reactions  . Codeine     REACTION: n/v/d, HA  . Morphine     REACTION: n/v/d, HA  . Zoledronic Acid     REACTION: Severe edema      Current Outpatient Prescriptions on File Prior to Visit  Medication Sig Dispense Refill  . albuterol (VENTOLIN HFA) 108 (90 BASE) MCG/ACT inhaler Inhale 1 puff into the lungs 3 (three) times daily as needed.        . Ascorbic Acid (VITAMIN C) 500 MG tablet Take 500 mg by mouth daily.        [name] aspirin 81 MG tablet Take 81 mg by mouth daily.        . budesonide-formoterol (SYMBICORT) 160-4.5 MCG/ACT inhaler Inhale 2 puffs into the lungs 2 (two) times daily.        . butalbital-acetaminophen-caffeine (FIORICET) 50-325-40 MG per tablet Take 1 tablet by mouth every 8 (eight) hours as needed.        . Calcium Carbonate-Vitamin D (CALCIUM 600-D) 600-400 MG-UNIT per tablet Take 1 tablet by mouth 2 (two) times daily with a meal.        . ergocalciferol (VITAMIN D2) 50000 UNITS capsule Take 50,000 Units by mouth once a week.        . ferrous fumarate-iron polysaccharide complex (TANDEM) 162-115.2 MG CAPS Take 1 capsule by mouth daily.        [name]  gabapentin (NEURONTIN) 100 MG capsule Take 100 mg by mouth 3 (three) times daily.        [name] HYDROmorphone (DILAUDID) 2 MG tablet Take 1 tablet by mouth once daily as needed.       . nebivolol (BYSTOLIC) 5 MG tablet Take 1/2 tablet by mouth daily.       . NON FORMULARY DILAUDID PUMP continuous       . NON FORMULARY OXYGEN. 2L at night       . omeprazole (PRILOSEC) 40 MG capsule Take 40 mg by mouth 2 (two) times daily before a meal.        . Respiratory Therapy Supplies (FLUTTER) DEVI Use 4 times daily.       [name] Spacer/Aero-Holding Chambers (AEROCHAMBER MV) inhaler Use with Symbicort.       . tiotropium (SPIRIVA) 18 MCG inhalation capsule Place 18 mcg into inhaler and inhale daily.        . Pancrelipase, Lip-Prot-Amyl, (CREON) 24000 UNITS CPEP Take 2 capsules by mouth 3-4 times per day before meals and snacks.         BP 120/74  Pulse 81  Temp(Src) 98.1 F (36.7 C) (Oral)  Resp 20  Ht 5\' 1"  (1.549 m)  Wt 120 lb (54.432 kg)  BMI 22.67 kg/m2  SpO2  94%       Objective:   Physical Exam  Constitutional: She appears well-developed and well-nourished.  Cardiovascular: Normal rate, regular rhythm and normal heart sounds.   Pulmonary/Chest: Effort normal and breath sounds normal.  Psychiatric: She has a normal mood and affect. Her behavior is normal.          Assessment & Plan:

## 2010-06-02 NOTE — Assessment & Plan Note (Signed)
Pt followed by pain mgt.  Pt advised to request additional breakthrough meds from pain mgt. We discussed providing additional pain meds only if approved in writing by pain mgt.

## 2010-06-06 ENCOUNTER — Other Ambulatory Visit: Payer: Self-pay | Admitting: *Deleted

## 2010-06-06 MED ORDER — LACTULOSE SOLN: 2.0000 mL | Freq: Every evening | Status: DC

## 2010-06-06 NOTE — Telephone Encounter (Signed)
Ok to refill x 3 

## 2010-06-06 NOTE — Telephone Encounter (Signed)
Refill sent to pharmacy.   

## 2010-06-06 NOTE — Telephone Encounter (Signed)
Patient called and left voice message requesting a refill for Lactulose

## 2010-06-14 NOTE — Assessment & Plan Note (Signed)
[facility]                               PULMONARY OFFICE NOTE   AMITA, ATAYDE                        MRN:          [id number]  DATE:08/07/2005                            DOB:          [birth year]    HISTORY:  This is a 77 year old white female remote smoker with unexplained  dyspnea associated with subjective wheezing and inability to use MDI  effectively on July 18, 2005.  I obtained a CT scan of the chest to be  complete and found that she had evidence of a very small loculated effusion  on the right but no evidence of pulmonary embolism or acute abnormality.  She denies having any major surgery on her chest but has had several bad  pneumonias remotely.   She returns today all smiles stating that she feels better since her  morphine was switched to Dilaudid.  Evidently she has also stopped Fosamax  and started high dose PPI therapy.  She was not able to maintain MDI therapy  so she stopped it.   PHYSICAL EXAMINATION:  GENERAL:  She is a pleasant white female in no acute  distress.  VITAL SIGNS: Stable.  LUNGS:  Lung fields are clear bilaterally to auscultation and percussion.  HEART:  Regular rhythm without murmurs, rubs, or gallops.  ABDOMEN:  Soft and nondistended.  EXTREMITIES:  no clubbing or edema.   PFTs reveal a mild to moderate air flow obstruction pattern but no improved  after bronchodilators.  Diffusion capacity is normal   IMPRESSION:  1.  Chronic obstructive pulmonary disease with no active asthmatic      component, therefore, it is difficult the nocturnal wheeze that she      had which resolved on the Fosamax and on PPI therapy most likely      represented VCD/GERD.  I think it is very unlikely that this was simply      a morphine reaction but since she is doing so well on her present      regimen, I am reluctant to change it.   In the event that she becomes convinced that reflux is not driving her  symptoms and/or she  cannot afford Prilosec, it is fine with me if she stops  it and sees if her symptoms flare (the reverse of a therapeutic trial).  1.  Small loculated right pleural effusion.  A followup chest x-ray was      recommended for three months with pulmonary followup in the meantime      p.r.n.                                  Charlaine Dalton. Sherene Sires, MD, FCCP   MBW/MedQ  DD:  08/07/2005  DT:  08/07/2005  Job #:  [id number]   cc:   Drucie Opitz

## 2010-06-14 NOTE — H&P (Signed)
NAME:  [name] [name], [name] [name] NO.:  [id number]   MEDICAL RECORD NO.:  [id number]                   PATIENT TYPE:  OIB   LOCATION:  3172                                 FACILITY:  [facility]   PHYSICIAN:  Payton Doughty, M.D.                   DATE OF BIRTH:  [birth year]   DATE OF ADMISSION:  08/01/2003  DATE OF DISCHARGE:                                HISTORY & PHYSICAL   ADMISSION DIAGNOSIS:  Spondylolysis with intractable back pain.   This is a very nice 77 year old right handed white lady who in 2003  underwent laminotomy foraminotomy at L4-5 and L5-S1 on the right side. She  has persistent pain in back and terrible scoliosis, not really a candidate  for a large operation. Now admitted for spinal cord stimulator for pain  control in the right leg and hip.   PAST MEDICAL HISTORY:  Unremarkable for significant diseases. She had a  cervical laminectomy by Dr. Ethelene Hal in 1972 and hysterectomy in 1970.   ALLERGIES:  She is allergic to CODEINE.   MEDICATIONS:  Only medication is Percocet.   FAMILY HISTORY:  Mother is 26 with heart disease. Dad died at 92 of heart  disease.   SOCIAL HISTORY:  Does not smoke or drink. She is retired from the city of  [employer].   REVIEW OF SYSTEMS:  Remarkable for back pain and pain in the right leg.   PHYSICAL EXAMINATION:  HEENT:  Within normal limits.  NECK:  She has reasonable range of motion in her neck.  CHEST:  Clear.  CARDIAC:  Regular rate and rhythm.  BACK:  She has an obvious rotoscoliosis.  ABDOMEN:  Nontender. No hepatosplenomegaly.  EXTREMITIES:  Without clubbing or cyanosis. Peripheral pulses are good.  GENITOURINARY:  Deferred.  NEUROLOGICAL:  She is awake, alert, and oriented. Her cranial nerves are  intact. Motor exam shows 5/5 strength throughout the upper and lower  extremities. She is tender to percussion over the right posterior spine.  Reflexes are 2 at the knees, absent at the ankles.   STUDIES:  MR  results that she has a significant scoliosis with mild  narrowing of the right sided foramina.   CLINICAL IMPRESSION:  Intractable back pain secondary to scoliosis. She does  not want _________ would like some pain relief. The plan is to try spinal  cord stimulator to be placed from T10 up through T8. The risks and benefits  of this approach have been discussed with her, and she wishes to proceed.                                                Payton Doughty, M.D.    MWR/MEDQ  D:  08/01/2003  T:  08/01/2003  Job:  [id number]

## 2010-06-14 NOTE — Op Note (Signed)
NAME:  MAKENDRA, VIGEANT NO.:  [id number]   MEDICAL RECORD NO.:  [id number]                   PATIENT TYPE:  OIB   LOCATION:  3172                                 FACILITY:  [facility]   PHYSICIAN:  Payton Doughty, M.D.                   DATE OF BIRTH:  July 17, 1925   DATE OF PROCEDURE:  08/01/2003  DATE OF DISCHARGE:                                 OPERATIVE REPORT   PREOPERATIVE DIAGNOSIS:  Intractable back pain.   POSTOPERATIVE DIAGNOSIS:  Intractable back pain.   OPERATIVE PROCEDURE:  Placement of spinal cord stimulator.   SURGEON:  Payton Doughty, M.D.   ASSISTANTBasilia Jumbo.   ANESTHESIA:  General endotracheal.   PREPARATION:  Sterile Betadine prep and scrub with alcohol wipe.   COMPLICATIONS:  None.   BODY OF TEXT:  A 77 year old lady with severe lumbar scoliosis and  intractable back and right hip pain.  Taken to the operating room and  smoothly anesthetized and intubated, placed prone on the operating table.  Following shave, prepped and draped in the usual sterile fashion.  The skin  was infiltrated with 1% lidocaine and 1:400,000 epinephrine.  Skin was  incised from T8 through T11 and the laminae of T11, T10, T9, and the bottom  of T8 were exposed bilaterally in the subperiosteal plane, intraoperative x-  ray confirming correctness of level.  Laminectomy of T11 was carried out to  the inferior aspect of the lamina.  The ligamentum flavum was removed and  the epidural fat dissected, and this revealed the dura.  An electrode was  passed through the epidural space without difficulty.  It was positioned  slightly high to start with, and it was drawn back down to position the  bottom electrode exactly at the T10-11 interspace.  The wound was irrigated  and hemostasis assured.  The strain relief devices were attached to the  interspinous ligament.  The extension electrodes were then placed and exited  via separate trocar incision.  The wound was  irrigated and hemostasis once  again ensured and the incision closed with successive layers of 3-0 Vicryl  and 3-0 nylon.  A Betadine and Telfa dressing was applied and the patient  then returned to the recovery room in good condition.                                               Payton Doughty, M.D.    MWR/MEDQ  D:  08/01/2003  T:  08/01/2003  Job:  [id number]

## 2010-06-14 NOTE — Assessment & Plan Note (Signed)
[facility]                               PULMONARY OFFICE NOTE   [name] [name], [name] [name]                        MRN:          [id number]  DATE:11/05/2005                            DOB:          [birth year]    HISTORY:  A 77 year old white female who carries the diagnosis of  COPD/asthma but actually had minimal air flow obstruction by previous TFTs  dated August 07, 2005, and no improvement after bronchodilators. I thought  most of her problems were upper airway in nature and asked her to stop  Fosamax and take Prilosec 20 mg b.i.d., and she had no symptoms at all when  she was here on July 12 on examination.  She states she has no limitations  presently due to dyspnea.  She does have occasional cough, and her family  hears her wheezing at night, but the patient denies that this is an issue.   For full list of medications, please see patient note dated November 05, 2005, corrected list.   PHYSICAL EXAMINATION:  GENERAL:  She is a chronically ill, ambulatory white  female in no acute distress.  VITAL SIGNS:  Normal.  HEENT:  Unremarkable.  __________ .  LUNGS:  Lung fields are clear bilaterally to auscultation and percussion.  There is a mild kyphotic deformity.  There is mild kyphosis.  HEART:  Regular rate and rhythm without murmurs, rubs, or gallops.  ABDOMEN:  Soft, benign.  EXTREMITIES:  Warm without calf tenderness, cyanosis, clubbing, or edema.   Hemoglobin saturation 95% on room air.   IMPRESSION:  Mild chronic obstructive pulmonary disease with no significant  asthmatic component presently.  Her family would like for her to have a  rescue inhaler.  So I spent extra time teaching her how use metered dose  inhaler technique via spacer and was only partially effective, no more than  25% by the end of the teaching session.  Therefore, I gave her a sample of  Xopenex and if she able to use it and benefit from the Xopenex on a p.r.n.  basis, I  think Combivent would be the best choice for her (no samples  available or a prescription given).   Other than that, if she is truly not limited by dyspnea, I see no benefit to  treating her with any form of maintenance therapy, and p.r.n. Combivent  should be adequate.   I note that my concern previously was she had upper airway instability and  for that reason is taking Prilosec with improvement in her symptoms.  She  would like to back on Fosamax which I  have told her that is fine as long as she remembers to stop it if her  symptoms exacerbate.  Follow up will be Dr. Debroah Loop in [facility].            ______________________________  Charlaine Dalton. Sherene Sires, MD, [facility]      MBW/MedQ  DD:  11/05/2005  DT:  11/07/2005  Job #:  [id number]

## 2010-06-14 NOTE — Op Note (Signed)
[place]. [facility]  Patient:    [name] [name], [name] [name] Visit Number: [id number] MRN: [id number]          Service Type: SUR Location: [facility] 3172 08 Attending Physician:  Emeterio Reeve Dictated by:   Payton Doughty, M.D. Proc. Date: 03/23/01 Admit Date:  03/23/2001                             Operative Report  PREOPERATIVE DIAGNOSIS:  Lumbar spondylosis and mixed right L5-S1 radiculopathies.  POSTOPERATIVE DIAGNOSIS:  Lumbar spondylosis and mixed right L5-S1 radiculopathies.  PROCEDURE:  Right-sided hemilaminectomy of L4 and L5.  SURGEON:  Payton Doughty, M.D.  ANESTHESIA:  General endotracheal.  PREP:  Prepped with Betadine and alcohol wipe.  COMPLICATIONS:  None.  ASSISTANT:  Stefani Dama, M.D.  INDICATIONS FOR PROCEDURE:  The patient is a 77 year old right-handed white female with severe lumbar spondylosis and right leg pain.  DESCRIPTION OF PROCEDURE:  She was taken to the operating room.  Anesthetized and intubated.  Placed prone on the operating room table.  After shave, prep, and drape in the usual sterile fashion, skin was infiltrated with 1% lidocaine with 1:400,000 epinephrine.  Skin was incised from the top of L4 to the mid S1, and the lamina of L4-5 and S1 were exposed on the right side and the subperiosteal plane.  Intraoperative x-ray confirmed correctness of level. The hemilamina of L5 and L4 were removed.  The ligamentum flavum was found to be extremely redundant, was also resected.  The 4 root, 5 root, and 1 root were carefully inspected and decompressed as they rounded their respective lamina.  The pars interarticularis was not violated, nor were the facet joints.  Following complete decompression, the wound was irrigated and hemostasis assured.  The entire defect was filled with Depo-Medrol soaked fat. The fascia was reapproximated with 0 Vicryl interrupted fashion. Subcutaneous tissues were reapproximated with 0 Vicryl in  interrupted fashion. The skin was closed with 3-0 nylon in running locked fashion.  Bacitracin and Telfa dressings were applied, and made occlusive with Op-Site.  The patient returned to the recovery room in good condition. Dictated by:   Payton Doughty, M.D. Attending Physician:  Emeterio Reeve DD:  03/23/01 TD:  03/23/01 Job: [id number]

## 2010-06-14 NOTE — H&P (Signed)
[place]. [facility]  Patient:    [name], [name] Visit Number: [id number] MRN: [id number]          Service Type: SUR Location: 3000 3005 01 Attending Physician:  Emeterio Reeve Dictated by:   Payton Doughty, M.D. Admit Date:  03/23/2001                           History and Physical  ADMITTING DIAGNOSES:  Scoliosis and spondylosis.  BRIEF HISTORY:  A 77 year old right handed white female who has been seeing Dr. Lovell Sheehan for a long time.  She had pain in her back and shooting down her right leg after a click in her back a few weeks ago and her daughter who works in the emergency room here brought her to see me.  She had been having some epidural steroids which were of no avail and she would like to try something to get her nerve roots decompressed so her leg will quit hurting.  Based on MRI she had severe spondylosis and narrowing of the neuroforamen on the right side at 4-5 and 5-1 and she is here for a laminotomy foraminotomy at each of these levels on the right.  Her medical history is remarkable for rheumatoid arthritis and fibromyalgia.  Her medications are celebrex 200 mg p.o. a day and darvocet on a p.r.n. basis.  ALLERGIES:  CODEINE causes nausea and vomiting.  SURGICAL HISTORY:  Cervical laminectomy by Dr. Clearance Coots in 1972, and a history in 1970.  FAMILY HISTORY:  The patients mother is 54 with heart disease.  Father died at 31 of heart disease.  SOCIAL HISTORY:  She does not smoke or drink and is retired from the [employer] of [employer].  REVIEW OF SYSTEMS:  Remarkable for back pain and pain in her right leg.  PHYSICAL EXAMINATION:  HEENT:  Within normal limits.  NECK:  She has reasonable range of motion in her neck.  CHEST:  Clear.  CARDIAC:  Regular rate and rhythm.  ABDOMEN:  Nontender, no hepatosplenomegaly.  EXTREMITIES:  Without clubbing or cyanosis.  GU:  Deferred.  PERIPHERAL PULSES:  Good.  BACK:  She does have marked  scoliosis of her back.  NEUROLOGIC:  She is awake, alert, and oriented.  Cranial nerves II-XII intact.  MOTOR EXAMINATION:  Shows 5/5 strength throughout the upper and lower extremities.  She is tender to percussion over the posterior iliac spine and the ______ on the right side.  Reflexes are 2 at the knees, absent at the ankles.  MR results have been removed above.  IMPRESSION:  Right L5-S1 radiculopathies.  PLAN:  The plan is for decompressive laminotomy and foraminotomy at both 4-5 and 5-1.  The risks and benefits of this approach have been discussed with her and she wishes to proceed. Dictated by:   Payton Doughty, M.D. Attending Physician:  Emeterio Reeve DD:  03/23/01 TD:  03/23/01 Job: [id number]

## 2010-06-14 NOTE — Op Note (Signed)
NAME:  [name] [name], [name] [name] NO.:  [id number]   MEDICAL RECORD NO.:  [id number]                   PATIENT TYPE:  OIB   LOCATION:  NA                                   FACILITY:  [facility]   PHYSICIAN:  Payton Doughty, M.D.                   DATE OF BIRTH:  January 28, 1926   DATE OF PROCEDURE:  08/15/2003  DATE OF DISCHARGE:                                 OPERATIVE REPORT   PREOPERATIVE DIAGNOSIS:  External spinal cord stimulator.   POSTOPERATIVE DIAGNOSIS:  External spinal cord stimulator.   OPERATIVE PROCEDURE:  Implantation of spinal cord receiver.   SURGEON:  Payton Doughty, M.D.   ANESTHESIA:  General endotracheal.   PREPARATION:  Sterile Betadine prep and scrub with alcohol wipe.   COMPLICATIONS:  None.   NURSE ASSISTANT:  [place].   BODY OF TEXT:  This is a 77 year old lady that had a spinal cord stimulator  implanted a couple of weeks ago, has been trialling it.  It seems to work,  and it is now implanted for use.  She was taken to the operating room and  smoothly anesthetized and intubated, placed prone on the operating table.  Following shave, prep and drape in usual sterile fashion, the stimulator  site was opened.  The wire was accessed, the extension wires removed, and  the stimulator wires tunneled subcutaneously to the left posterior superior  iliac spine, where through a transverse incision they were connected to an  ANS receiver.  It was implanted subcutaneously with less than a centimeter  thickness of skin overlying it.  It was attached with the strain relief  devices in place.  Both sounds were irrigated with bacitracin and closed  with successive layers of Vicryl and nylon.  The patient returned to the  recovery room in good condition.                                               Payton Doughty, M.D.    MWR/MEDQ  D:  08/15/2003  T:  08/16/2003  Job:  [id number]

## 2010-06-14 NOTE — Discharge Summary (Signed)
[name] [name]. [facility]  Patient:    [name] [name] Visit Number: [id number] MRN: [id number]          Service Type: SUR Location: 3000 3005 01 Attending Physician:  Emeterio Reeve Dictated by:   Payton Doughty, M.D. Admit Date:  03/23/2001 Discharge Date: 04/01/2001                             Discharge Summary  ADMISSION DIAGNOSIS:  Lumbar spondylitic scoliosis.  DISCHARGE DIAGNOSIS: Lumbar spondylitic scoliosis.  PROCEDURE:  Right sided L4-L5 and L5-S1 laminotomy and foraminotomy.  COMPLICATIONS:  None.  DISCHARGE STATUS:  Alive and well.  HOSPITAL COURSE:  This is a 77 year old right handed white lady whose history and physical is recounted in the chart.  She basically has right sided leg pain and severe spondylitic scoliosis.  Magnetic resonance imaging showed neuroforaminal narrowing and she was admitted for decompression and fusion.  MEDICATIONS:  Celebrex, Darvocet.  ALLERGIES:  CODEINE.  GENERAL EXAMINATION:  Intact.  NEUROLOGICAL EXAMINATION:  Awake and alert with intact strength.  She had positive Tinels over the ischial spine on the right side and also over the posterior superior iliac spine.  She had a positive straight leg raise.  The patient was admitted after ascertaining normal laboratory values and underwent a laminotomy and foraminotomy on the right side.  Prior to operation she had had difficulty walking.  Postoperatively she can get up and about with a walker.  She was visited with the physical therapy who spent significant time with her helping her mobilize.  She is up and about with a walker.  She was visited by the rehabilitation folks who initially said that she was a good candidate for the subacute unit.  There was then some mysterious change of heart where they felt that she could be adequately managed at home.  The reason for this is not entirely clear to me but they nonetheless seem reluctant to take her on to the  subacute care unit.  On examination her lower extremity strength is full.  She has difficulty getting in and out of chairs. The plan is to discharge her home with home health for physical therapy.  She will be sent home on Darvocet.  FOLLOW UP:  Her follow up will be with the [facility] office in one week for suture removal. Dictated by:   Payton Doughty, M.D. Attending Physician:  Emeterio Reeve DD:  04/01/01 TD:  04/02/01 Job: [id number]

## 2010-06-14 NOTE — Op Note (Signed)
NAME:  [name] [name], [name] [name] NO.:  [id number]   MEDICAL RECORD NO.:  [id number]                   PATIENT TYPE:  INP   LOCATION:  3008                                 FACILITY:  [facility]   PHYSICIAN:  Payton Doughty, M.D.                   DATE OF BIRTH:  [birth year]   DATE OF PROCEDURE:  08/04/2003  DATE OF DISCHARGE:                                 OPERATIVE REPORT   PREOPERATIVE DIAGNOSIS:  Misplaced spinal cord stimulator.   POSTOPERATIVE DIAGNOSIS:  Misplaced spinal cord stimulator.   OPERATION PERFORMED:  Revision of spinal cord stimulator placement.   SURGEON:  Payton Doughty, M.D.   ANESTHESIA:  General endotracheal.   PREP:  Sterile Betadine prep and scrub with alcohol wipe.   COMPLICATIONS:  None.   NURSE ASSISTANT:  [place].   INDICATIONS FOR PROCEDURE:  The patient is a 77 year old lady who had a  spinal cord stimulator placed three days ago.  Impedance is very high and  review of x-rays revealed that the stimulator is probably placed upside down  or in an inverted position.  The patient was then taken back to the  operating room for revision.   DESCRIPTION OF PROCEDURE:  The patient was taken to the operating room,  smoothly anesthetized, intubated, and placed prone on the operating table.  Following shave, prep and drape in the usual sterile fashion, the old skin  incision was reopened and the stimulator and its attachments rapidly  accessed.  The extension wires were removed.  The stimulator was withdrawn  from the epidural space and was found to be in with the stimulating surface  pointing in a dorsal direction instead of a ventral direction toward the  cord.  It was flipped over, replaced.  X-ray showed good placement.  The  anchoring strain relieving devices were reattached using silk.  New  extension cords were attached  and exited by separate incision.  The  incision was copiously irrigated.  Hemostasis assured and closed with  successive layers of  2-0 Vicryl, 3-0 Vicryl and 3-0 nylon.  Betadine Telfa  dressing was applied to both sites.  The patient was then transferred to the  recovery room in good condition.                                               Payton Doughty, M.D.    MWR/MEDQ  D:  08/04/2003  T:  08/04/2003  Job:  [id number]

## 2010-06-14 NOTE — Discharge Summary (Signed)
NAME:  [name] [name], [name] [name] NO.:  [id number]   MEDICAL RECORD NO.:  [id number]                   PATIENT TYPE:  INP   LOCATION:  3008                                 FACILITY:  [facility]   PHYSICIAN:  Payton Doughty, M.D.                   DATE OF BIRTH:  05/06/1925   DATE OF ADMISSION:  08/01/2003  DATE OF DISCHARGE:  08/17/2003                                 DISCHARGE SUMMARY   ADMISSION DIAGNOSIS:  Intractable back pain and spondylosis.   DISCHARGE DIAGNOSIS:  Intractable back pain and spondylosis.   PROCEDURE:  Placement of spinal cord stimulator.   SURGEON:  Payton Doughty, M.D.   SECOND ASSISTANT:  Neurosurgery.   COMPLICATIONS:  Displaced spinal cord stimulator.   CONDITION ON DISCHARGE:  Alive and well.   This is a very nice 77 year old right-handed white female whose history and  physical is recounted on the chart.  She has intractable back pain related  to spondylosis and scoliosis.   PAST MEDICAL HISTORY:  Osteoporosis.  Her general health has been good.   ALLERGIES:  CODEINE.   PHYSICAL EXAMINATION:  General examination was intact.  Neurologically, she  had scoliosis, intact strength and back pain.   She was admitted after ascertainment of normal laboratory values on August 01, 2003, and underwent placement of spinal cord stimulator.  It did not work  very well.  She had high impedance.  After review of her films, it was  discovered that the simulator was placed with the leads in upside down  position.  She was then taken back to the operating room on August 04, 2003,  and underwent revision.   Postoperatively, she had a lot of pain and pain down her rib cage.  She  convalesced in the hospital through several days and then has been able to  get up and ambulate.  As time passed and the stimulator was placed into use  by the external leads, it became obvious that the stimulator was, in fact,  helping her.  As her incisional soreness waned, the  stimulator produced more  and more beneficial effects to the pain in her hip and down her legs.  At no  time did she suffer any neurologic deficit from it.  She remained on  antibiotics during the time that these wires were externalized.   By August 15, 2003, she was doing well enough that it was felt that it would  be beneficial to implant the stimulator receiver since she was receiving  good benefit from it.  On August 15, 2003, the stimulator was implanted.  Postoperatively, she did well from this procedure and has been up and about.  She is eating and voiding normally.  Her incision is dry and well-healing.   She is being discharged home on Vicodin and Flexeril with ciprofloxin.   Her follow-up will be in the  [facility] office in  about 10 days for suture removal.                                                Payton Doughty, M.D.    MWR/MEDQ  D:  08/17/2003  T:  08/18/2003  Job:  [id number]

## 2010-08-02 ENCOUNTER — Telehealth: Payer: Self-pay | Admitting: *Deleted

## 2010-08-02 DIAGNOSIS — IMO0002 Reserved for concepts with insufficient information to code with codable children: Secondary | ICD-10-CM

## 2010-08-02 DIAGNOSIS — I10 Essential (primary) hypertension: Secondary | ICD-10-CM

## 2010-08-02 DIAGNOSIS — D649 Anemia, unspecified: Secondary | ICD-10-CM

## 2010-08-02 DIAGNOSIS — M810 Age-related osteoporosis without current pathological fracture: Secondary | ICD-10-CM

## 2010-08-02 LAB — CBC WITH DIFFERENTIAL/PLATELET
Basophils Absolute: 0 10*3/uL (ref 0.0–0.1)
Basophils Relative: 0 % (ref 0–1)
Eosinophils Absolute: 0.2 10*3/uL (ref 0.0–0.7)
Eosinophils Relative: 3 % (ref 0–5)
HCT: 38.1 % (ref 36.0–46.0)
Hemoglobin: 11.8 g/dL — ABNORMAL LOW (ref 12.0–15.0)
Lymphocytes Relative: 28 % (ref 12–46)
Lymphs Abs: 1.3 10*3/uL (ref 0.7–4.0)
MCH: 29.4 pg (ref 26.0–34.0)
MCHC: 31 g/dL (ref 30.0–36.0)
MCV: 95 fL (ref 78.0–100.0)
Monocytes Absolute: 0.3 10*3/uL (ref 0.1–1.0)
Monocytes Relative: 7 % (ref 3–12)
Neutro Abs: 3 10*3/uL (ref 1.7–7.7)
Neutrophils Relative %: 62 % (ref 43–77)
Platelets: 179 10*3/uL (ref 150–400)
RBC: 4.01 MIL/uL (ref 3.87–5.11)
RDW: 14.2 % (ref 11.5–15.5)
WBC: 4.8 10*3/uL (ref 4.0–10.5)

## 2010-08-02 LAB — BASIC METABOLIC PANEL
BUN: 20 mg/dL (ref 6–23)
CO2: 29 mEq/L (ref 19–32)
Calcium: 8.6 mg/dL (ref 8.4–10.5)
Chloride: 101 mEq/L (ref 96–112)
Creat: 0.95 mg/dL (ref 0.50–1.10)
Glucose, Bld: 83 mg/dL (ref 70–99)
Potassium: 4.6 mEq/L (ref 3.5–5.3)
Sodium: 139 mEq/L (ref 135–145)

## 2010-08-02 NOTE — Telephone Encounter (Signed)
Patient presented to office for blood work, and she informed front office staff Cindee Salt) that she thinks that she has urine infection and request to have a urinalysis performed. Marj was informed to urine test would be added to blood work.

## 2010-08-03 LAB — URINALYSIS, ROUTINE W REFLEX MICROSCOPIC
Bilirubin Urine: NEGATIVE
Glucose, UA: NEGATIVE mg/dL
Hgb urine dipstick: NEGATIVE
Ketones, ur: NEGATIVE mg/dL
Nitrite: NEGATIVE
Protein, ur: NEGATIVE mg/dL
Specific Gravity, Urine: 1.026 (ref 1.005–1.030)
Urobilinogen, UA: 0.2 mg/dL (ref 0.0–1.0)
pH: 5 (ref 5.0–8.0)

## 2010-08-03 LAB — URINALYSIS, MICROSCOPIC ONLY
Casts: NONE SEEN
Crystals: NONE SEEN

## 2010-08-03 LAB — VITAMIN D 25 HYDROXY (VIT D DEFICIENCY, FRACTURES): Vit D, 25-Hydroxy: 55 ng/mL (ref 30–89)

## 2010-08-07 ENCOUNTER — Telehealth: Payer: Self-pay | Admitting: *Deleted

## 2010-08-07 NOTE — Telephone Encounter (Signed)
Spoke to pt, she states she has intermittent dysuria and some low back pain. Advised her we would send in Cipro Rx and she states that it makes her extremely nauseated. Denies difficulty with other antibiotics. Please advise if there is another alternative? Also, pt is schedule to see you tomorrow at 1:15 for UTI. Can we cancel that appt?

## 2010-08-07 NOTE — Telephone Encounter (Signed)
Message copied by Kathi Simpers on Wed Aug 07, 2010  1:38 PM ------      Message from: Staci Righter      Created: Wed Aug 07, 2010 10:47 AM       It looks llike a UA was added to routine labs for possible uti sx. pls check with pt and if has sx's consistent with uti (ie freq, dysuria, urgency, etc) then begin cipro 250mg  po bid x 5 (if no med interactions)      Thanks      W

## 2010-08-08 ENCOUNTER — Ambulatory Visit: Payer: Medicare Other | Admitting: Internal Medicine

## 2010-08-08 MED ORDER — SULFAMETHOXAZOLE-TRIMETHOPRIM 800-160 MG PO TABS: 1.0000 | ORAL_TABLET | Freq: Two times a day (BID) | ORAL | Status: DC

## 2010-08-08 NOTE — Telephone Encounter (Signed)
Notified pt per Dr Ilda Foil recommendation. Pt has never tried either but prefers the mildest. Called Septra DS to pharmacist at [facility] as below. Pt aware to call for appt if symptoms not resolved after abx. Appt cancelled for today.

## 2010-08-08 NOTE — Telephone Encounter (Signed)
Either septra ds bid x 7d or macrobid 100mg  bid x 7d. Can cancel if wants but f/u if no resolution of sx's after abx

## 2010-08-12 ENCOUNTER — Telehealth: Payer: Self-pay | Admitting: Internal Medicine

## 2010-08-12 NOTE — Telephone Encounter (Signed)
Refill- gabapentin 100mg  cap. Take one capsule by mouth three times a day. Qty 90. Last fill 5.19.12

## 2010-08-14 MED ORDER — GABAPENTIN 100 MG PO CAPS: 100.0000 mg | ORAL_CAPSULE | Freq: Three times a day (TID) | ORAL | Status: DC

## 2010-08-14 NOTE — Telephone Encounter (Signed)
RX sent

## 2010-08-14 NOTE — Telephone Encounter (Signed)
Last seen by Dr. Artist Pais in 4/12.  [name] [name] is receiving Gabapentin for neck pain since 7/11.  [name] [name] has follow up appt with you tomorrow.  Is it OK to fill or wait until tomorrow?

## 2010-08-14 NOTE — Telephone Encounter (Signed)
Ok to fill 

## 2010-08-15 ENCOUNTER — Encounter: Payer: Self-pay | Admitting: Internal Medicine

## 2010-08-15 ENCOUNTER — Ambulatory Visit: Payer: Medicare Other | Admitting: Internal Medicine

## 2010-08-15 ENCOUNTER — Ambulatory Visit (INDEPENDENT_AMBULATORY_CARE_PROVIDER_SITE_OTHER): Payer: Medicare Other | Admitting: Internal Medicine

## 2010-08-15 DIAGNOSIS — J4489 Other specified chronic obstructive pulmonary disease: Secondary | ICD-10-CM

## 2010-08-15 DIAGNOSIS — I509 Heart failure, unspecified: Secondary | ICD-10-CM

## 2010-08-15 DIAGNOSIS — E559 Vitamin D deficiency, unspecified: Secondary | ICD-10-CM

## 2010-08-15 DIAGNOSIS — J449 Chronic obstructive pulmonary disease, unspecified: Secondary | ICD-10-CM

## 2010-08-17 DIAGNOSIS — E559 Vitamin D deficiency, unspecified: Secondary | ICD-10-CM | POA: Insufficient documentation

## 2010-08-17 NOTE — Assessment & Plan Note (Signed)
Normalized. Stop ergocalciferol. begin OTC vitamin D 800 units daily

## 2010-08-17 NOTE — Assessment & Plan Note (Signed)
Clinically euvolemic.  Stable. °

## 2010-08-17 NOTE — Assessment & Plan Note (Signed)
Mild increase in symptoms possibly related to weather. Exam normal. Monitor for worsening and followup closely. Continue current regimen.

## 2010-08-17 NOTE — Progress Notes (Signed)
  Subjective:    Patient ID: [name] [name], female    DOB: October 26, 1925, 77 y.o.   MRN: [id number]  HPI Patient presents to clinic for evaluation of multiple medical problems. Recently completed 7 day course of antibiotic for UTI. Urinary symptoms have resolved and denies fever chills, urinary frequency or dysuria. Has had mild increase in baseline shortness of breath over the past several weeks possibly associated with the weather. No recent infection or illness. Denies wheezing or chest pain. History of pancreatic insufficiency and weight is up 2 pounds. Suffers from chronic back pain followed by the pain clinic and appear stable. History of vitamin D deficiency with most recent vitamin D level normalized. No other complaints  Reviewed past medical history, medications and allergies  Review of Systems see history of present illness     Objective:   Physical Exam    Physical Exam  Vitals reviewed. Constitutional:  appears well-developed and well-nourished. No distress.  HENT:  Head: Normocephalic and atraumatic.  Nose: Nose normal.  Mouth/Throat: Oropharynx is clear and moist. No oropharyngeal exudate.  Eyes: Conjunctivae clear. Right eye exhibits no discharge. Left eye exhibits no discharge. No scleral icterus.  Neck: Neck supple. No thyromegaly present. No JVD Cardiovascular: Normal rate, regular rhythm and normal heart sounds.  Exam reveals no gallop and no friction rub.   No murmur heard. Pulmonary/Chest: Effort normal and breath sounds normal. No respiratory distress.  has no wheezes.  has no rales.  Lymphadenopathy:   no cervical adenopathy.  Neurological:  is alert.  Skin: Skin is warm and dry.  not diaphoretic.  Psychiatric: normal mood and affect.  Extremities: No edema    Assessment & Plan:

## 2010-08-22 ENCOUNTER — Encounter: Payer: Self-pay | Admitting: Critical Care Medicine

## 2010-08-22 ENCOUNTER — Ambulatory Visit (INDEPENDENT_AMBULATORY_CARE_PROVIDER_SITE_OTHER): Payer: Medicare Other | Admitting: Critical Care Medicine

## 2010-08-22 VITALS — BP 100/62 | HR 79 | Temp 98.4°F | Ht 60.0 in | Wt 123.0 lb

## 2010-08-22 DIAGNOSIS — J209 Acute bronchitis, unspecified: Secondary | ICD-10-CM

## 2010-08-22 DIAGNOSIS — J4489 Other specified chronic obstructive pulmonary disease: Secondary | ICD-10-CM

## 2010-08-22 DIAGNOSIS — J449 Chronic obstructive pulmonary disease, unspecified: Secondary | ICD-10-CM

## 2010-08-22 MED ORDER — PREDNISONE 10 MG PO TABS: ORAL_TABLET | ORAL | Status: DC

## 2010-08-22 MED ORDER — LEVOFLOXACIN 750 MG PO TABS: 750.0000 mg | ORAL_TABLET | Freq: Every day | ORAL | Status: AC

## 2010-08-22 NOTE — Patient Instructions (Signed)
Prednisone 10mg  Take 4 for two days, three for two days, two for two days, then one for two days and stop Levaquin 750mg  daily for 5days No other medication changes Return 2 months [place]

## 2010-08-22 NOTE — Progress Notes (Signed)
Subjective:    Patient ID: [name] [name], female    DOB: 03/06/1925, 77 y.o.   MRN: [id number]  HPI 11 yowf  July 19, 2009 11:58 AM  This pt was admitted 6/10 -6/13 for RLL CAP and colitis of sigmoid. All c/s data was neg. Pt had AMS. She responded to flagyl and levaquin. Last CXR showed no PNA and CT chest essentially neg. Echo normal.  The diarrhea is now resolved.  Pt notes now bowels are better, no diarrhea, no n/v no abd pain, Pt still with cough and tightness in chest , occ prod mucus yellow, no real pndrip. No chest pain. No edema in feet, but did have edema before. Still with chronic headache. Head hurts across forehead and to the side of the head. The pt remains on oxygen at bedtime along with symbicort and spiriva.  October 04, 2009 1:33 PM  Pt notes since 6/11. having sore throat started two weeks ago. recent CT chest. no real fever. The pt si still with cough and is congested.  The mucus is yellow and gray. There is no real chest pain. The pt notes some headaches. THe pt is not dyspneic. There is no real edema. The Head aches on front and top  The pt was ecent rx with cleocin  A recent CT chest noted with LL tree in bud pattern ?MAC .  CT sinus was normal  November 01, 2009 2:23 PM  Dyspnea is the same, not as bad. Not able to raise the mucus. No real chest pain. No sore throat.  No f/c/s. Notes some headaches. Mucus is yellow not a gray. Took abx became white and foamy.  at home pulse ox is 94%  January 03, 2010 11:50 AM  Pt in hosp 11/6 - 12/07/09 for pancreatitis  since d/c no cough, no pain now. occ pain in LUQ but is better.  Notes more congestion and more dyspneic at night. Occ heartburn with this . Sleeps on two pillows. Bed is raised 6 inches.  Has chronic headaches and to see neurology (2/12)  February 07, 2010 2:28 PM  dx diarrhea and abd pain. dx colitis ?etiol pt admitted for same  d/c on hyosciamine for nausea and abd pain  LFTs higher  dyspnea is worse than  before . notes some cough but something in the throat  mucus off white  no real chest pain. has indigestion  no further diarrhea  still with abd pain  February 21, 2010 2:03 PM  at last ov we rec:  rx flagyl and cipro to cover GI and lungs  cough is less, flutter valve helps.  GI MD saw the pt and shows stones in the biliary tree. GB out for three years. Problem since that time  is now off all abx. no chest pain. low grade fever. diarrhea is better. no n/v  to have ercp for stones on 03/01/10  April 11, 2010 1:50 PM  Had ERCP 2/12: no stones seen. Opened up bile duct. abd pain is better, no n/v. Notes some cough. Notes some mucus in the throat   08/22/2010 One month of worsening dyspnea.  Notes some cough productive, yellow. Notes some chest pain. Notes some wheeze.   Past Medical History  Diagnosis Date  . Osteoporosis   . Arthritis     osteoarthritis  . GERD (gastroesophageal reflux disease)   . Hiatal hernia   . COPD (chronic obstructive pulmonary disease)   . Pneumonia   . Hypotension   .  PUD (peptic ulcer disease)   . Cardiac arrhythmia   . Hyperlipidemia   . Heart failure   . Hypertension   . Scoliosis deformity of spine     history of intractable back pain  . Choledocholithiasis      Family History  Problem Relation Age of Onset  . Cancer Mother     uterine  . Heart disease Father   . Hypertension Other      History   Social History  . Marital Status: Widowed    Spouse Name: N/A    Number of Children: 3  . Years of Education: N/A   Occupational History  .     Social History Main Topics  . Smoking status: Former Smoker -- 1.0 packs/day for 12 years    Types: Cigarettes    Quit date: 01/28/1988  . Smokeless tobacco: Never Used  . Alcohol Use: No  . Drug Use: Not on file  . Sexually Active: Not on file   Other Topics Concern  . Not on file   Social History Narrative   1 grandchild at care link--AngeliaLives with great-granddaughter      Allergies  Allergen Reactions  . Codeine     REACTION: n/v/d, HA  . Morphine     REACTION: n/v/d, HA  . Zoledronic Acid     REACTION: Severe edema     Outpatient Prescriptions Prior to Visit  Medication Sig Dispense Refill  . albuterol (VENTOLIN HFA) 108 (90 BASE) MCG/ACT inhaler Inhale 1 puff into the lungs 3 (three) times daily as needed.        . Ascorbic Acid (VITAMIN C) 500 MG tablet Take 500 mg by mouth daily.        [name] aspirin 81 MG tablet Take 81 mg by mouth daily.        . budesonide-formoterol (SYMBICORT) 160-4.5 MCG/ACT inhaler Inhale 2 puffs into the lungs 2 (two) times daily.        . butalbital-acetaminophen-caffeine (FIORICET) 50-325-40 MG per tablet Take 1 tablet by mouth every 8 (eight) hours as needed.        . Calcium Carbonate-Vitamin D (CALCIUM 600-D) 600-400 MG-UNIT per tablet Take 1 tablet by mouth 2 (two) times daily with a meal.        . cyclobenzaprine (FLEXERIL) 10 MG tablet Take 1 tablet by mouth Three times a day.      . ferrous fumarate-iron polysaccharide complex (TANDEM) 162-115.2 MG CAPS Take 1 capsule by mouth daily.        [name] gabapentin (NEURONTIN) 100 MG capsule Take 1 capsule (100 mg total) by mouth 3 (three) times daily.  90 capsule  0  . Lactulose SOLN Take 2 mLs by mouth every evening.  500 mL  2  . nebivolol (BYSTOLIC) 5 MG tablet Take 1/2 tablet by mouth daily.       . NON FORMULARY OXYGEN. 2L at night       . omeprazole (PRILOSEC) 40 MG capsule Take 40 mg by mouth 2 (two) times daily before a meal.        . oxyCODONE-acetaminophen (PERCOCET) 5-325 MG per tablet Take 1 tablet by mouth Three times a day.      [name] Respiratory Therapy Supplies (FLUTTER) DEVI Use 3-4 times daily.      [name] Spacer/Aero-Holding Chambers (AEROCHAMBER MV) inhaler Use with Symbicort.       . tiotropium (SPIRIVA) 18 MCG inhalation capsule Place 18 mcg into inhaler and inhale daily.  Review of Systems Constitutional:   No  weight loss, night sweats,  Fevers,  chills, fatigue, lassitude. HEENT:   No headaches,  Difficulty swallowing,  Tooth/dental problems,  Sore throat,                No sneezing, itching, ear ache, nasal congestion, post nasal drip,   CV:  No chest pain,  Orthopnea, PND, swelling in lower extremities, anasarca, dizziness, palpitations  GI  No heartburn, indigestion, abdominal pain, nausea, vomiting, diarrhea, change in bowel habits, loss of appetite  Resp: Notes shortness of breath with exertion and at rest.  Notes  excess mucus, notes  productive cough,  No non-productive cough,  No coughing up of blood.  No change in color of mucus.  No wheezing.  No chest wall deformity  Skin: no rash or lesions.  GU: no dysuria, change in color of urine, no urgency or frequency.  No flank pain.  MS:  No joint pain or swelling.  No decreased range of motion.  No back pain.  Psych:  No change in mood or affect. No depression or anxiety.  No memory loss.     Objective:   Physical Exam Filed Vitals:   08/22/10 1611  BP: 100/62  Pulse: 79  Temp: 98.4 F (36.9 C)  TempSrc: Oral  Height: 5' (1.524 m)  Weight: 123 lb (55.792 kg)  SpO2: 93%    Gen: Pleasant, well-nourished, in no distress,  normal affect  ENT: No lesions,  mouth clear,  oropharynx clear, no postnasal drip  Neck: No JVD, no TMG, no carotid bruits  Lungs: No use of accessory muscles, no dullness to percussion,exp wheeze  Cardiovascular: RRR, heart sounds normal, no murmur or gallops, no peripheral edema  Abdomen: soft and NT, no HSM,  BS normal  Musculoskeletal: No deformities, no cyanosis or clubbing  Neuro: alert, non focal  Skin: Warm, no lesions or rashes        Assessment & Plan:   COPD Copd exac d/t tracheobronchitis Plan Prednisone 10mg  Take 4 for two days, three for two days, two for two days, then one for two days and stop Levaquin 750mg  daily for 5days No other medication changes Return 2 months [place]     Updated Medication  List Outpatient Encounter Prescriptions as of 08/22/2010  Medication Sig Dispense Refill  . albuterol (VENTOLIN HFA) 108 (90 BASE) MCG/ACT inhaler Inhale 1 puff into the lungs 3 (three) times daily as needed.        . Ascorbic Acid (VITAMIN C) 500 MG tablet Take 500 mg by mouth daily.        [name] aspirin 81 MG tablet Take 81 mg by mouth daily.        . budesonide-formoterol (SYMBICORT) 160-4.5 MCG/ACT inhaler Inhale 2 puffs into the lungs 2 (two) times daily.        . butalbital-acetaminophen-caffeine (FIORICET) 50-325-40 MG per tablet Take 1 tablet by mouth every 8 (eight) hours as needed.        . Calcium Carbonate-Vitamin D (CALCIUM 600-D) 600-400 MG-UNIT per tablet Take 1 tablet by mouth 2 (two) times daily with a meal.        . cyclobenzaprine (FLEXERIL) 10 MG tablet Take 1 tablet by mouth Three times a day.      . ferrous fumarate-iron polysaccharide complex (TANDEM) 162-115.2 MG CAPS Take 1 capsule by mouth daily.        [name] gabapentin (NEURONTIN) 100 MG capsule Take 1 capsule (100 mg total)  by mouth 3 (three) times daily.  90 capsule  0  . Lactulose SOLN Take 2 mLs by mouth every evening.  500 mL  2  . nebivolol (BYSTOLIC) 5 MG tablet Take 1/2 tablet by mouth daily.       . NON FORMULARY OXYGEN. 2L at night       . omeprazole (PRILOSEC) 40 MG capsule Take 40 mg by mouth 2 (two) times daily before a meal.        . oxyCODONE-acetaminophen (PERCOCET) 5-325 MG per tablet Take 1 tablet by mouth Three times a day.      . promethazine (PHENERGAN) 25 MG tablet Take by mouth as needed.      [name] Respiratory Therapy Supplies (FLUTTER) DEVI Use 3-4 times daily.      [name] Spacer/Aero-Holding Chambers (AEROCHAMBER MV) inhaler Use with Symbicort.       . tiotropium (SPIRIVA) 18 MCG inhalation capsule Place 18 mcg into inhaler and inhale daily.        . Vitamin D, Ergocalciferol, (DRISDOL) 50000 UNITS CAPS Once weekly      . levofloxacin (LEVAQUIN) 750 MG tablet Take 1 tablet (750 mg total) by mouth daily.  5  tablet  0  . predniSONE (DELTASONE) 10 MG tablet Take 4 for two days, three for two days, two for two days, then one for two days and stop   20 tablet  0

## 2010-08-24 NOTE — Assessment & Plan Note (Signed)
Copd exac d/t tracheobronchitis Plan Prednisone 10mg  Take 4 for two days, three for two days, two for two days, then one for two days and stop Levaquin 750mg  daily for 5days No other medication changes Return 2 months [place]

## 2010-09-05 ENCOUNTER — Telehealth: Payer: Self-pay | Admitting: Critical Care Medicine

## 2010-09-05 NOTE — Telephone Encounter (Signed)
ATC pt, NA and no option to leave a msg, mailbox full, WCB.

## 2010-09-05 NOTE — Telephone Encounter (Signed)
Suggest doxycycline 100 mg, # 8   2 today then one daily     Prednisone 20 mg daily # 7                We can offer benzonatate/ tessalon for cough 200mg ,  # 30, 1 tid prn cough      F/u with Dr Delford Field

## 2010-09-05 NOTE — Telephone Encounter (Signed)
[name] [name] pt--Spoke with pt daughter and she states the pt finished Levaquin on 08-27-10 and prednisone taper on 08-30-10, but her chest congestion is much worse then when she saw [name] [name] on 08-22-10 and was given the meds. She has a cough, but daughter states it is very hard for her to get any phlegm to come up. She states the cough sounds "very deep" in her chest. She is still having SOB as well but it is no worse then at time of OV.  Please advise. Carron Curie, CMA Allergies  Allergen Reactions  . Codeine     REACTION: n/v/d, HA  . Morphine     REACTION: n/v/d, HA  . Zoledronic Acid     REACTION: Severe edema

## 2010-09-06 MED ORDER — PREDNISONE 20 MG PO TABS: 20.0000 mg | ORAL_TABLET | Freq: Every day | ORAL | Status: AC

## 2010-09-06 MED ORDER — BENZONATATE 200 MG PO CAPS: 200.0000 mg | ORAL_CAPSULE | Freq: Three times a day (TID) | ORAL | Status: AC | PRN

## 2010-09-06 MED ORDER — DOXYCYCLINE HYCLATE 100 MG PO TABS: ORAL_TABLET | ORAL | Status: DC

## 2010-09-06 NOTE — Telephone Encounter (Signed)
Pt daughter advised and rx sent. Pt has appt with PW on 10-17-10. Carron Curie, CMA

## 2010-10-17 ENCOUNTER — Encounter: Payer: Self-pay | Admitting: Critical Care Medicine

## 2010-10-17 ENCOUNTER — Ambulatory Visit (INDEPENDENT_AMBULATORY_CARE_PROVIDER_SITE_OTHER): Payer: Medicare Other | Admitting: Critical Care Medicine

## 2010-10-17 VITALS — BP 132/64 | HR 94 | Temp 98.1°F | Ht 61.0 in | Wt 128.0 lb

## 2010-10-17 DIAGNOSIS — J449 Chronic obstructive pulmonary disease, unspecified: Secondary | ICD-10-CM

## 2010-10-17 DIAGNOSIS — J209 Acute bronchitis, unspecified: Secondary | ICD-10-CM

## 2010-10-17 MED ORDER — PREDNISONE 10 MG PO TABS: ORAL_TABLET | ORAL | Status: DC

## 2010-10-17 MED ORDER — AZITHROMYCIN 250 MG PO TABS: ORAL_TABLET | ORAL | Status: DC

## 2010-10-17 NOTE — Progress Notes (Signed)
Subjective:    Patient ID: [name] [name], female    DOB: [birth year], 77 y.o.   MRN: [id number]  HPI 77 y.o.WF with COPD  10/17/2010 Notes more wheezing and DOE. Also dypsnea at night.  Notes cough prod yellow.  Notes some abd pain. Symptoms for one month   Past Medical History  Diagnosis Date  . Osteoporosis   . Arthritis     osteoarthritis  . GERD (gastroesophageal reflux disease)   . Hiatal hernia   . COPD (chronic obstructive pulmonary disease)   . Pneumonia   . Hypotension   . PUD (peptic ulcer disease)   . Cardiac arrhythmia   . Hyperlipidemia   . Heart failure   . Hypertension   . Scoliosis deformity of spine     history of intractable back pain  . Choledocholithiasis      Family History  Problem Relation Age of Onset  . Cancer Mother     uterine  . Heart disease Father   . Hypertension Other      History   Social History  . Marital Status: Widowed    Spouse Name: N/A    Number of Children: 3  . Years of Education: N/A   Occupational History  .     Social History Main Topics  . Smoking status: Former Smoker -- 1.0 packs/day for 12 years    Types: Cigarettes    Quit date: 01/28/1988  . Smokeless tobacco: Never Used  . Alcohol Use: No  . Drug Use: Not on file  . Sexually Active: Not on file   Other Topics Concern  . Not on file   Social History Narrative   1 grandchild at care link--AngeliaLives with great-granddaughter     Allergies  Allergen Reactions  . Codeine     REACTION: n/v/d, HA  . Morphine     REACTION: n/v/d, HA  . Zoledronic Acid     REACTION: Severe edema     Outpatient Prescriptions Prior to Visit  Medication Sig Dispense Refill  . albuterol (VENTOLIN HFA) 108 (90 BASE) MCG/ACT inhaler Inhale 1 puff into the lungs 3 (three) times daily as needed.        . Ascorbic Acid (VITAMIN C) 500 MG tablet Take 500 mg by mouth daily.        [name] aspirin 81 MG tablet Take 81 mg by mouth daily.        . budesonide-formoterol  (SYMBICORT) 160-4.5 MCG/ACT inhaler Inhale 2 puffs into the lungs 2 (two) times daily.        . butalbital-acetaminophen-caffeine (FIORICET) 50-325-40 MG per tablet Take 1 tablet by mouth every 8 (eight) hours as needed.        . Calcium Carbonate-Vitamin D (CALCIUM 600-D) 600-400 MG-UNIT per tablet Take 1 tablet by mouth 2 (two) times daily with a meal.        . cyclobenzaprine (FLEXERIL) 10 MG tablet Take 1 tablet by mouth Three times a day.      . ferrous fumarate-iron polysaccharide complex (TANDEM) 162-115.2 MG CAPS Take 1 capsule by mouth daily.        [name] gabapentin (NEURONTIN) 100 MG capsule Take 1 capsule (100 mg total) by mouth 3 (three) times daily.  90 capsule  0  . Lactulose SOLN Take 2 mLs by mouth every evening.  500 mL  2  . nebivolol (BYSTOLIC) 5 MG tablet Take 1/2 tablet by mouth daily.       . NON FORMULARY OXYGEN. 2L at  night       . omeprazole (PRILOSEC) 40 MG capsule Take 40 mg by mouth 2 (two) times daily before a meal.        . oxyCODONE-acetaminophen (PERCOCET) 5-325 MG per tablet Take 1 tablet by mouth Three times a day.      . promethazine (PHENERGAN) 25 MG tablet Take by mouth as needed.      [name] Respiratory Therapy Supplies (FLUTTER) DEVI Use 3-4 times daily.      [name] Spacer/Aero-Holding Chambers (AEROCHAMBER MV) inhaler Use with Symbicort.       . tiotropium (SPIRIVA) 18 MCG inhalation capsule Place 18 mcg into inhaler and inhale daily.        . predniSONE (DELTASONE) 10 MG tablet Take 4 for two days, three for two days, two for two days, then one for two days and stop   20 tablet  0  . Vitamin D, Ergocalciferol, (DRISDOL) 50000 UNITS CAPS Once weekly      . doxycycline (VIBRA-TABS) 100 MG tablet Take 2 tablets today and then one daily until gone  8 tablet  0      Review of Systems  Constitutional:   No  weight loss, night sweats,  Fevers, chills, fatigue, lassitude. HEENT:   No headaches,  Difficulty swallowing,  Tooth/dental problems,  Sore throat,                 No sneezing, itching, ear ache, nasal congestion, post nasal drip,   CV:  No chest pain,  Orthopnea, PND, swelling in lower extremities, anasarca, dizziness, palpitations  GI  No heartburn, indigestion, abdominal pain, nausea, vomiting, diarrhea, change in bowel habits, loss of appetite  Resp: Notes shortness of breath with exertion and at rest.  Notes  excess mucus, notes  productive cough,  No non-productive cough,  No coughing up of blood.  No change in color of mucus.  No wheezing.  No chest wall deformity  Skin: no rash or lesions.  GU: no dysuria, change in color of urine, no urgency or frequency.  No flank pain.  MS:  No joint pain or swelling.  No decreased range of motion.  No back pain.  Psych:  No change in mood or affect. No depression or anxiety.  No memory loss.     Objective:   Physical Exam  Filed Vitals:   10/17/10 1337  BP: 132/64  Pulse: 94  Temp: 98.1 F (36.7 C)  TempSrc: Oral  Height: 5\' 1"  (1.549 m)  Weight: 128 lb (58.06 kg)  SpO2: 96%    Gen: Pleasant, well-nourished, in no distress,  normal affect  ENT: No lesions,  mouth clear,  oropharynx clear, no postnasal drip  Neck: No JVD, no TMG, no carotid bruits  Lungs: No use of accessory muscles, no dullness to percussion,exp wheeze  Cardiovascular: RRR, heart sounds normal, no murmur or gallops, no peripheral edema  Abdomen: soft and NT, no HSM,  BS normal  Musculoskeletal: No deformities, no cyanosis or clubbing  Neuro: alert, non focal  Skin: Warm, no lesions or rashes        Assessment & Plan:   COPD Acute tracheobronchitis with flare  Plan Azithromycin x 5days Pulse prednisone No change in inhaled or maintenance medications. Return in 1 week     Updated Medication List Outpatient Encounter Prescriptions as of 10/17/2010  Medication Sig Dispense Refill  . albuterol (VENTOLIN HFA) 108 (90 BASE) MCG/ACT inhaler Inhale 1 puff into the lungs 3 (three) times daily as  needed.         . Ascorbic Acid (VITAMIN C) 500 MG tablet Take 500 mg by mouth daily.        [name] aspirin 81 MG tablet Take 81 mg by mouth daily.        . budesonide-formoterol (SYMBICORT) 160-4.5 MCG/ACT inhaler Inhale 2 puffs into the lungs 2 (two) times daily.        . butalbital-acetaminophen-caffeine (FIORICET) 50-325-40 MG per tablet Take 1 tablet by mouth every 8 (eight) hours as needed.        . Calcium Carbonate-Vitamin D (CALCIUM 600-D) 600-400 MG-UNIT per tablet Take 1 tablet by mouth 2 (two) times daily with a meal.        . Cholecalciferol (VITAMIN D PO) Take 1 tablet by mouth daily.        . cyclobenzaprine (FLEXERIL) 10 MG tablet Take 1 tablet by mouth Three times a day.      . ferrous fumarate-iron polysaccharide complex (TANDEM) 162-115.2 MG CAPS Take 1 capsule by mouth daily.        [name] gabapentin (NEURONTIN) 100 MG capsule Take 1 capsule (100 mg total) by mouth 3 (three) times daily.  90 capsule  0  . Lactulose SOLN Take 2 mLs by mouth every evening.  500 mL  2  . nebivolol (BYSTOLIC) 5 MG tablet Take 1/2 tablet by mouth daily.       . NON FORMULARY OXYGEN. 2L at night       . omeprazole (PRILOSEC) 40 MG capsule Take 40 mg by mouth 2 (two) times daily before a meal.        . oxyCODONE-acetaminophen (PERCOCET) 5-325 MG per tablet Take 1 tablet by mouth Three times a day.      . promethazine (PHENERGAN) 25 MG tablet Take by mouth as needed.      [name] Respiratory Therapy Supplies (FLUTTER) DEVI Use 3-4 times daily.      [name] Spacer/Aero-Holding Chambers (AEROCHAMBER MV) inhaler Use with Symbicort.       . tiotropium (SPIRIVA) 18 MCG inhalation capsule Place 18 mcg into inhaler and inhale daily.        [name] DISCONTD: predniSONE (DELTASONE) 10 MG tablet Take 4 for two days, three for two days, two for two days, then one for two days and stop   20 tablet  0  . DISCONTD: Vitamin D, Ergocalciferol, (DRISDOL) 50000 UNITS CAPS Once weekly      . azithromycin (ZITHROMAX) 250 MG tablet Take two once then one daily  until gone  6 each  0  . predniSONE (DELTASONE) 10 MG tablet Take 4 for two days three for two days two for two days one for two days  20 tablet  0  . DISCONTD: azithromycin (ZITHROMAX) 250 MG tablet Take two once then one daily until gone  6 each  0  . DISCONTD: doxycycline (VIBRA-TABS) 100 MG tablet Take 2 tablets today and then one daily until gone  8 tablet  0  . DISCONTD: predniSONE (DELTASONE) 10 MG tablet Take 4 for two days three for two days two for two days one for two days  20 tablet  0

## 2010-10-17 NOTE — Assessment & Plan Note (Signed)
Acute tracheobronchitis with flare  Plan Azithromycin x 5days Pulse prednisone No change in inhaled or maintenance medications. Return in 1 week

## 2010-10-17 NOTE — Patient Instructions (Signed)
Azithromycin 250mg  Take two once then one daily until gone Prednisone 10mg  Take 4 for two days three for two days two for two days one for two days No other changes in medications Return 2 weeks

## 2010-10-31 ENCOUNTER — Ambulatory Visit (INDEPENDENT_AMBULATORY_CARE_PROVIDER_SITE_OTHER): Payer: Medicare Other | Admitting: Critical Care Medicine

## 2010-10-31 ENCOUNTER — Encounter: Payer: Self-pay | Admitting: Critical Care Medicine

## 2010-10-31 VITALS — BP 108/62 | HR 78 | Temp 99.0°F | Ht 62.0 in | Wt 126.0 lb

## 2010-10-31 DIAGNOSIS — B370 Candidal stomatitis: Secondary | ICD-10-CM

## 2010-10-31 DIAGNOSIS — J209 Acute bronchitis, unspecified: Secondary | ICD-10-CM

## 2010-10-31 DIAGNOSIS — J4489 Other specified chronic obstructive pulmonary disease: Secondary | ICD-10-CM

## 2010-10-31 DIAGNOSIS — J449 Chronic obstructive pulmonary disease, unspecified: Secondary | ICD-10-CM

## 2010-10-31 DIAGNOSIS — J309 Allergic rhinitis, unspecified: Secondary | ICD-10-CM

## 2010-10-31 MED ORDER — AMOXICILLIN-POT CLAVULANATE 875-125 MG PO TABS: 1.0000 | ORAL_TABLET | Freq: Two times a day (BID) | ORAL | Status: AC

## 2010-10-31 MED ORDER — FLUTICASONE FUROATE 27.5 MCG/SPRAY NA SUSP: 2.0000 | Freq: Every day | NASAL | Status: DC

## 2010-10-31 MED ORDER — FLUCONAZOLE 100 MG PO TABS: ORAL_TABLET | ORAL | Status: DC

## 2010-10-31 MED ORDER — PREDNISONE 10 MG PO TABS: ORAL_TABLET | ORAL | Status: DC

## 2010-10-31 NOTE — Assessment & Plan Note (Addendum)
Copd with recent tracheobronchitis, now recurrent with oral candidiasis Plan Stay on inhalers USe veramyst two sprays each nostril daily until sample gone, no refill Augmentin one twice daily for 10days Recycle prednisone 10mg  Take 4 for three days 3 for three days 2 for three days 1 for three days and stop Diflucan /fluconazole 100mg  two once then one daily for mouth sores Return [place] one month

## 2010-10-31 NOTE — Patient Instructions (Signed)
Stay on inhalers USe veramyst two sprays each nostril daily until sample gone, no refill Augmentin one twice daily for 10days Recycle prednisone 10mg  Take 4 for three days 3 for three days 2 for three days 1 for three days and stop Diflucan /fluconazole 100mg  two once then one daily for mouth sores Return [place] one month

## 2010-10-31 NOTE — Progress Notes (Signed)
Subjective:    Patient ID: [name] [name], female    DOB: 1925/11/20, 77 y.o.   MRN: [id number]  HPI 77 y.o.WF with COPD   10/31/2010 At last ov we rx zpak and pred pulse,  Not any better. Still coughing sl yellow.  Still dyspneic with activity.  No real abdominal pain.  Occ wheeze.  No edema in feet.  No f/c but + sweats.  Past Medical History  Diagnosis Date  . Osteoporosis   . Arthritis     osteoarthritis  . GERD (gastroesophageal reflux disease)   . Hiatal hernia   . COPD (chronic obstructive pulmonary disease)   . Pneumonia   . Hypotension   . PUD (peptic ulcer disease)   . Cardiac arrhythmia   . Hyperlipidemia   . Heart failure   . Hypertension   . Scoliosis deformity of spine     history of intractable back pain  . Choledocholithiasis      Family History  Problem Relation Age of Onset  . Cancer Mother     uterine  . Heart disease Father   . Hypertension Other      History   Social History  . Marital Status: Widowed    Spouse Name: N/A    Number of Children: 3  . Years of Education: N/A   Occupational History  .     Social History Main Topics  . Smoking status: Former Smoker -- 1.0 packs/day for 12 years    Types: Cigarettes    Quit date: 01/28/1988  . Smokeless tobacco: Never Used  . Alcohol Use: No  . Drug Use: Not on file  . Sexually Active: Not on file   Other Topics Concern  . Not on file   Social History Narrative   1 grandchild at care link--AngeliaLives with great-granddaughter     Allergies  Allergen Reactions  . Codeine     REACTION: n/v/d, HA  . Morphine     REACTION: n/v/d, HA  . Zoledronic Acid     REACTION: Severe edema     Outpatient Prescriptions Prior to Visit  Medication Sig Dispense Refill  . albuterol (VENTOLIN HFA) 108 (90 BASE) MCG/ACT inhaler Inhale 1 puff into the lungs 3 (three) times daily as needed.        . Ascorbic Acid (VITAMIN C) 500 MG tablet Take 500 mg by mouth daily.        [name] aspirin 81 MG tablet  Take 81 mg by mouth daily.        . budesonide-formoterol (SYMBICORT) 160-4.5 MCG/ACT inhaler Inhale 2 puffs into the lungs 2 (two) times daily.        . butalbital-acetaminophen-caffeine (FIORICET) 50-325-40 MG per tablet Take 1 tablet by mouth every 8 (eight) hours as needed.        . Calcium Carbonate-Vitamin D (CALCIUM 600-D) 600-400 MG-UNIT per tablet Take 1 tablet by mouth 2 (two) times daily with a meal.        . Cholecalciferol (VITAMIN D PO) Take 1 tablet by mouth daily.        . cyclobenzaprine (FLEXERIL) 10 MG tablet Take 1 tablet by mouth Three times a day.      . ferrous fumarate-iron polysaccharide complex (TANDEM) 162-115.2 MG CAPS Take 1 capsule by mouth daily.        [name] gabapentin (NEURONTIN) 100 MG capsule Take 1 capsule (100 mg total) by mouth 3 (three) times daily.  90 capsule  0  . Lactulose SOLN Take  2 mLs by mouth every evening.  500 mL  2  . nebivolol (BYSTOLIC) 5 MG tablet Take 1/2 tablet by mouth daily.       . NON FORMULARY OXYGEN. 2L at night       . omeprazole (PRILOSEC) 40 MG capsule Take 40 mg by mouth 2 (two) times daily before a meal.        . promethazine (PHENERGAN) 25 MG tablet Take by mouth as needed.      [name] Respiratory Therapy Supplies (FLUTTER) DEVI Use 3-4 times daily.      [name] Spacer/Aero-Holding Chambers (AEROCHAMBER MV) inhaler Use with Symbicort.       . tiotropium (SPIRIVA) 18 MCG inhalation capsule Place 18 mcg into inhaler and inhale daily.        [name] azithromycin (ZITHROMAX) 250 MG tablet Take two once then one daily until gone  6 each  0  . oxyCODONE-acetaminophen (PERCOCET) 5-325 MG per tablet Take 1 tablet by mouth Three times a day.      . predniSONE (DELTASONE) 10 MG tablet Take 4 for two days three for two days two for two days one for two days  20 tablet  0      Review of Systems  Constitutional:   No  weight loss, night sweats,  Fevers, chills, fatigue, lassitude. HEENT:   No headaches,  Difficulty swallowing,  Tooth/dental problems,  Sore  throat,                No sneezing, itching, ear ache, nasal congestion, post nasal drip,   CV:  No chest pain,  Orthopnea, PND, swelling in lower extremities, anasarca, dizziness, palpitations  GI  No heartburn, indigestion, abdominal pain, nausea, vomiting, diarrhea, change in bowel habits, loss of appetite  Resp: Notes shortness of breath with exertion and at rest.  Notes  excess mucus, notes  productive cough,  No non-productive cough,  No coughing up of blood.  No change in color of mucus.  No wheezing.  No chest wall deformity  Skin: no rash or lesions.  GU: no dysuria, change in color of urine, no urgency or frequency.  No flank pain.  MS:  No joint pain or swelling.  No decreased range of motion.  No back pain.  Psych:  No change in mood or affect. No depression or anxiety.  No memory loss.     Objective:   Physical Exam  Filed Vitals:   10/31/10 1319  BP: 108/62  Pulse: 78  Temp: 99 F (37.2 C)  TempSrc: Oral  Height: 5\' 2"  (1.575 m)  Weight: 126 lb (57.153 kg)  SpO2: 95%    Gen: Pleasant, well-nourished, in no distress,  normal affect  ENT: No lesions,  mouth clear,  oropharynx clear, no postnasal drip  Neck: No JVD, no TMG, no carotid bruits  Lungs: No use of accessory muscles, no dullness to percussion,exp wheeze  Cardiovascular: RRR, heart sounds normal, no murmur or gallops, no peripheral edema  Abdomen: soft and NT, no HSM,  BS normal  Musculoskeletal: No deformities, no cyanosis or clubbing  Neuro: alert, non focal  Skin: Warm, no lesions or rashes        Assessment & Plan:   COPD Copd with recent tracheobronchitis, now recurrent with oral candidiasis Plan Stay on inhalers USe veramyst two sprays each nostril daily until sample gone, no refill Augmentin one twice daily for 10days Recycle prednisone 10mg  Take 4 for three days 3 for three days 2 for three  days 1 for three days and stop Diflucan /fluconazole 100mg  two once then one daily  for mouth sores Return [place] one month       Updated Medication List Outpatient Encounter Prescriptions as of 10/31/2010  Medication Sig Dispense Refill  . albuterol (VENTOLIN HFA) 108 (90 BASE) MCG/ACT inhaler Inhale 1 puff into the lungs 3 (three) times daily as needed.        . Ascorbic Acid (VITAMIN C) 500 MG tablet Take 500 mg by mouth daily.        [name] aspirin 81 MG tablet Take 81 mg by mouth daily.        . budesonide-formoterol (SYMBICORT) 160-4.5 MCG/ACT inhaler Inhale 2 puffs into the lungs 2 (two) times daily.        . butalbital-acetaminophen-caffeine (FIORICET) 50-325-40 MG per tablet Take 1 tablet by mouth every 8 (eight) hours as needed.        . Calcium Carbonate-Vitamin D (CALCIUM 600-D) 600-400 MG-UNIT per tablet Take 1 tablet by mouth 2 (two) times daily with a meal.        . Cholecalciferol (VITAMIN D PO) Take 1 tablet by mouth daily.        . cyclobenzaprine (FLEXERIL) 10 MG tablet Take 1 tablet by mouth Three times a day.      . ferrous fumarate-iron polysaccharide complex (TANDEM) 162-115.2 MG CAPS Take 1 capsule by mouth daily.        [name] gabapentin (NEURONTIN) 100 MG capsule Take 1 capsule (100 mg total) by mouth 3 (three) times daily.  90 capsule  0  . HYDROmorphone HCl (DILAUDID PO) Take by mouth. 2-3 times daily       . Lactulose SOLN Take 2 mLs by mouth every evening.  500 mL  2  . nebivolol (BYSTOLIC) 5 MG tablet Take 1/2 tablet by mouth daily.       . NON FORMULARY OXYGEN. 2L at night       . omeprazole (PRILOSEC) 40 MG capsule Take 40 mg by mouth 2 (two) times daily before a meal.        . promethazine (PHENERGAN) 25 MG tablet Take by mouth as needed.      [name] Respiratory Therapy Supplies (FLUTTER) DEVI Use 3-4 times daily.      [name] Spacer/Aero-Holding Chambers (AEROCHAMBER MV) inhaler Use with Symbicort.       . tiotropium (SPIRIVA) 18 MCG inhalation capsule Place 18 mcg into inhaler and inhale daily.        [name] amoxicillin-clavulanate (AUGMENTIN) 875-125 MG  per tablet Take 1 tablet by mouth 2 (two) times daily.  20 tablet  0  . fluconazole (DIFLUCAN) 100 MG tablet Take two then one daily  7 tablet  0  . fluticasone (VERAMYST) 27.5 MCG/SPRAY nasal spray Place 2 sprays into the nose daily.  10 g  0  . predniSONE (DELTASONE) 10 MG tablet Take 4 for three days 3 for three days 2 for three days 1 for three days and stop  30 tablet  0  . DISCONTD: azithromycin (ZITHROMAX) 250 MG tablet Take two once then one daily until gone  6 each  0  . DISCONTD: oxyCODONE-acetaminophen (PERCOCET) 5-325 MG per tablet Take 1 tablet by mouth Three times a day.      [name] DISCONTD: predniSONE (DELTASONE) 10 MG tablet Take 4 for two days three for two days two for two days one for two days  20 tablet  0

## 2010-11-07 ENCOUNTER — Other Ambulatory Visit: Payer: Self-pay | Admitting: Neurosurgery

## 2010-11-07 DIAGNOSIS — M4120 Other idiopathic scoliosis, site unspecified: Secondary | ICD-10-CM

## 2010-11-11 ENCOUNTER — Ambulatory Visit (INDEPENDENT_AMBULATORY_CARE_PROVIDER_SITE_OTHER): Payer: Medicare Other | Admitting: Cardiology

## 2010-11-11 ENCOUNTER — Encounter: Payer: Self-pay | Admitting: Cardiology

## 2010-11-11 VITALS — BP 130/70 | HR 98 | Ht 62.0 in | Wt 128.0 lb

## 2010-11-11 DIAGNOSIS — R0609 Other forms of dyspnea: Secondary | ICD-10-CM

## 2010-11-11 DIAGNOSIS — R0989 Other specified symptoms and signs involving the circulatory and respiratory systems: Secondary | ICD-10-CM

## 2010-11-11 DIAGNOSIS — E7800 Pure hypercholesterolemia, unspecified: Secondary | ICD-10-CM

## 2010-11-11 DIAGNOSIS — I119 Hypertensive heart disease without heart failure: Secondary | ICD-10-CM

## 2010-11-11 DIAGNOSIS — R079 Chest pain, unspecified: Secondary | ICD-10-CM

## 2010-11-11 DIAGNOSIS — J449 Chronic obstructive pulmonary disease, unspecified: Secondary | ICD-10-CM

## 2010-11-11 DIAGNOSIS — J4489 Other specified chronic obstructive pulmonary disease: Secondary | ICD-10-CM

## 2010-11-11 DIAGNOSIS — R0600 Dyspnea, unspecified: Secondary | ICD-10-CM

## 2010-11-11 DIAGNOSIS — I10 Essential (primary) hypertension: Secondary | ICD-10-CM

## 2010-11-11 MED ORDER — NITROGLYCERIN 0.4 MG SL SUBL: 0.4000 mg | SUBLINGUAL_TABLET | SUBLINGUAL | Status: DC | PRN

## 2010-11-11 NOTE — Assessment & Plan Note (Signed)
The patient denies any hemoptysis or purulent sputum.  No recent fever or chills

## 2010-11-11 NOTE — Progress Notes (Signed)
[name] [name] Date of Birth:  08-16-1925 [facility] / [facility] [address]   [address] [place], [place]  [address] [phone]           Fax   [phone]  History of Present Illness: This pleasant 77 year old woman seen for a scheduled 1 year followup office visit.  Patient has a history of atypical chest pain.  She had a nuclear stress test on 04/05/08, which showed normal left ventricular systolic function with an ejection fraction of 70% and no evidence of reversible ischemia.  There were no wall motion abnormalities.  The patient does have a history of severe COPD and is followed by pulmonary.  Patient also has a history of hypercholesterolemia.  She's had a past history of orthostatic hypotension, but now has a history of mild essential hypertension.  Blood pressure has been well controlled on nebivolol.  Patient has a history of chronic pain syndrome and is followed in a pain clinic.  Current Outpatient Prescriptions  Medication Sig Dispense Refill  . albuterol (VENTOLIN HFA) 108 (90 BASE) MCG/ACT inhaler Inhale 1 puff into the lungs 3 (three) times daily as needed.        . Ascorbic Acid (VITAMIN C) 500 MG tablet Take 500 mg by mouth daily.        [name] aspirin 81 MG tablet Take 81 mg by mouth daily.        . budesonide-formoterol (SYMBICORT) 160-4.5 MCG/ACT inhaler Inhale 2 puffs into the lungs 2 (two) times daily.        . butalbital-acetaminophen-caffeine (FIORICET) 50-325-40 MG per tablet Take 1 tablet by mouth every 8 (eight) hours as needed.        . Calcium Carbonate-Vitamin D (CALCIUM 600-D) 600-400 MG-UNIT per tablet Take 1 tablet by mouth 2 (two) times daily with a meal.        . Cholecalciferol (VITAMIN D PO) Take 1 tablet by mouth daily.        . cyclobenzaprine (FLEXERIL) 10 MG tablet Take 1 tablet by mouth Three times a day.      . ferrous fumarate-iron polysaccharide complex (TANDEM) 162-115.2 MG CAPS Take 1 capsule by mouth daily.        . fluticasone  (VERAMYST) 27.5 MCG/SPRAY nasal spray Place 2 sprays into the nose daily.  10 g  0  . gabapentin (NEURONTIN) 100 MG capsule Take 1 capsule (100 mg total) by mouth 3 (three) times daily.  90 capsule  0  . HYDROmorphone HCl (DILAUDID PO) Take by mouth. 2-3 times daily       . Lactulose SOLN Take 2 mLs by mouth every evening.  500 mL  2  . nebivolol (BYSTOLIC) 5 MG tablet Take 1/2 tablet by mouth daily.       . NON FORMULARY OXYGEN. 2L at night       . omeprazole (PRILOSEC) 40 MG capsule Take 20 mg by mouth daily.       . predniSONE (DELTASONE) 10 MG tablet Take 4 for three days 3 for three days 2 for three days 1 for three days and stop  30 tablet  0  . promethazine (PHENERGAN) 25 MG tablet Take by mouth as needed.      [name] Respiratory Therapy Supplies (FLUTTER) DEVI Use 3-4 times daily.      [name] Spacer/Aero-Holding Chambers (AEROCHAMBER MV) inhaler Use with Symbicort.       . tiotropium (SPIRIVA) 18 MCG inhalation capsule Place 18 mcg into inhaler and inhale  daily.        . amoxicillin-clavulanate (AUGMENTIN) 875-125 MG per tablet Take 1 tablet by mouth 2 (two) times daily.  20 tablet  0  . nitroGLYCERIN (NITROSTAT) 0.4 MG SL tablet Place 1 tablet (0.4 mg total) under the tongue every 5 (five) minutes as needed for chest pain.  100 tablet  3    Allergies  Allergen Reactions  . Codeine     REACTION: n/v/d, HA  . Morphine     REACTION: n/v/d, HA  . Zoledronic Acid     REACTION: Severe edema    Patient Active Problem List  Diagnoses  . HYPERCHOLESTEROLEMIA  . ANEMIA  . HEARING LOSS, LEFT EAR  . HYPERTENSION  . COR PULMONALE  . CARDIAC ARRHYTHMIA  . HEART FAILURE  . COPD  . GERD  . PEPTIC ULCER DISEASE  . HIATAL HERNIA  . PANCREATITIS  . OSTEOARTHRITIS  . NECK PAIN, LEFT  . OSTEOPOROSIS  . HEADACHE  . NONSPECIFIC ABNORMAL TOXICOLOGICAL FINDINGS  . PANCREATIC INSUFFICIENCY  . Chronic back pain  . Vitamin D deficiency    History  Smoking status  . Former Smoker -- 1.0  packs/day for 12 years  . Types: Cigarettes  . Quit date: 01/28/1988  Smokeless tobacco  . Never Used    History  Alcohol Use No    Family History  Problem Relation Age of Onset  . Cancer Mother     uterine  . Heart disease Father   . Hypertension Other     Review of Systems: Constitutional: no fever chills diaphoresis or fatigue or change in weight.  Head and neck: no hearing loss, no epistaxis, no photophobia or visual disturbance. Respiratory: No cough, shortness of breath or wheezing. Cardiovascular: No chest pain peripheral edema, palpitations. Gastrointestinal: No abdominal distention, no abdominal pain, no change in bowel habits hematochezia or melena. Genitourinary: No dysuria, no frequency, no urgency, no nocturia. Musculoskeletal:No arthralgias, no back pain, no gait disturbance or myalgias. Neurological: No dizziness, no headaches, no numbness, no seizures, no syncope, no weakness, no tremors. Hematologic: No lymphadenopathy, no easy bruising. Psychiatric: No confusion, no hallucinations, no sleep disturbance.    Physical Exam: Filed Vitals:   11/11/10 1324  BP: 130/70  Pulse: 98   the general appearance reveals a well-developed, well-nourished woman in no acute distress.Pupils equal and reactive.   Extraocular Movements are full.  There is no scleral icterus.  The mouth and pharynx are normal.  The neck is supple.  The carotids reveal no bruits.  The jugular venous pressure is normal.  The thyroid is not enlarged.  There is no lymphadenopathy.  Chest reveals bilateral crackling pulmonary ralesThe precordium is quiet.  The first heart sound is normal.  The second heart sound is physiologically split.  There is no murmur gallop rub or click.  There is no abnormal lift or heave.  The abdomen is soft and nontender. Bowel sounds are normal. The liver and spleen are not enlarged. There Are no abdominal masses. There are no bruits.  The pedal pulses are good.  There  is no phlebitis or edema.  There is no cyanosis or clubbing. Strength is normal and symmetrical in all extremities.  There is no lateralizing weakness.  There are no sensory deficits.     Assessment / Plan: Continue same medication.  Recheck in one year for office visit and EKG.  We refilled her Nitrostat 0.4 mg sublingually when necessary for atypical chest pain

## 2010-11-11 NOTE — Assessment & Plan Note (Signed)
Patient has a past history of hypercholesterolemia.  This is followed by her primary care provider.  She is not presently on statin therapy

## 2010-11-11 NOTE — Assessment & Plan Note (Signed)
The patient's blood pressure recently has been stable on current therapy.  No recent dizzy spells or syncope

## 2010-11-11 NOTE — Patient Instructions (Signed)
continue same dose of medications  A prescription for NTG has been sent to your pharmacy to have on hand if you should have chest pains  Your physician wants you to follow-up in: 1 year You will receive a reminder letter in the mail two months in advance. If you don't receive a letter, please call our office to schedule the follow-up appointment.

## 2010-11-12 ENCOUNTER — Ambulatory Visit (INDEPENDENT_AMBULATORY_CARE_PROVIDER_SITE_OTHER): Payer: Medicare Other | Admitting: Internal Medicine

## 2010-11-12 ENCOUNTER — Ambulatory Visit (HOSPITAL_BASED_OUTPATIENT_CLINIC_OR_DEPARTMENT_OTHER)
Admission: RE | Admit: 2010-11-12 | Discharge: 2010-11-12 | Disposition: A | Discharge status: Home / Self Care | Payer: Medicare Other | Source: Ambulatory Visit | Attending: Internal Medicine | Admitting: Internal Medicine

## 2010-11-12 ENCOUNTER — Encounter: Payer: Self-pay | Admitting: Internal Medicine

## 2010-11-12 VITALS — BP 100/60 | HR 84 | Temp 98.0°F | Resp 18 | Wt 128.0 lb

## 2010-11-12 DIAGNOSIS — K1379 Other lesions of oral mucosa: Secondary | ICD-10-CM

## 2010-11-12 DIAGNOSIS — R918 Other nonspecific abnormal finding of lung field: Secondary | ICD-10-CM

## 2010-11-12 DIAGNOSIS — J449 Chronic obstructive pulmonary disease, unspecified: Secondary | ICD-10-CM

## 2010-11-12 DIAGNOSIS — K1370 Unspecified lesions of oral mucosa: Secondary | ICD-10-CM

## 2010-11-12 DIAGNOSIS — J4489 Other specified chronic obstructive pulmonary disease: Secondary | ICD-10-CM | POA: Insufficient documentation

## 2010-11-12 MED ORDER — MAGIC MOUTHWASH: 5.0000 mL | Freq: Three times a day (TID) | ORAL | Status: DC

## 2010-11-12 NOTE — Progress Notes (Signed)
  Subjective:    Patient ID: [name] [name], female    DOB: 09/05/1925, 77 y.o.   MRN: [id number]  HPI Pt presents to clinic for followup of multiple medical problems. H/o copd recently tx'ed by pulm for possible bronchitis with augmentin x 10d with prednisone. Doesn't feel substantially better. +chest congestion. No cough. Requests magic mouthwash for mouth irritation. No thrush though recently tx'ed with diflucan. No alleviating or exacerbating factors. No other complaints. Continues to be followed by the pain clinic for chronic neck/back pain. No other complaints.  Past Medical History  Diagnosis Date  . Osteoporosis   . Arthritis     osteoarthritis  . GERD (gastroesophageal reflux disease)   . Hiatal hernia   . COPD (chronic obstructive pulmonary disease)   . Pneumonia   . Hypotension   . PUD (peptic ulcer disease)   . Cardiac arrhythmia   . Hyperlipidemia   . Heart failure   . Hypertension   . Scoliosis deformity of spine     history of intractable back pain  . Choledocholithiasis   . Pancolitis     Infectious vs. inflammatory  . Abdominal pain     Resolved  . Tachycardia   . Chronic anemia    Past Surgical History  Procedure Date  . Cholecystectomy 2008  . Abdominal hysterectomy 1970s  . Spine surgery [phone]  . Neck surgery 1970s  . Spinal cord stimulator implant   . Ercp w/ sphicterotomy 02/2010    reports that she quit smoking about 22 years ago. Her smoking use included Cigarettes. She has a 12 pack-year smoking history. She has never used smokeless tobacco. She reports that she does not drink alcohol. Her drug history not on file. family history includes Cancer in her mother; Heart disease in her father; and Hypertension in her other. Allergies  Allergen Reactions  . Codeine     REACTION: n/v/d, HA  . Morphine     REACTION: n/v/d, HA  . Zoledronic Acid     REACTION: Severe edema     Review of Systems see hpi     Objective:   Physical Exam    Nursing note and vitals reviewed. Constitutional: She appears well-developed and well-nourished. No distress.  HENT:  Head: Normocephalic and atraumatic.  Right Ear: External ear normal.  Left Ear: External ear normal.  Nose: Nose normal.  Mouth/Throat: Oropharynx is clear and moist. No oropharyngeal exudate.  Eyes: Conjunctivae are normal. No scleral icterus.  Neck: Neck supple. No JVD present.  Cardiovascular: Normal rate, regular rhythm and normal heart sounds.  Exam reveals no gallop and no friction rub.   No murmur heard. Pulmonary/Chest: Effort normal. No respiratory distress. She has no wheezes. She has rales.       Right mid lung rhonchi.  Neurological: She is alert.  Skin: Skin is warm and dry. She is not diaphoretic. No erythema.  Psychiatric: She has a normal mood and affect.          Assessment & Plan:

## 2010-11-13 ENCOUNTER — Telehealth: Payer: Self-pay | Admitting: *Deleted

## 2010-11-13 NOTE — Telephone Encounter (Signed)
Pharmacist with [facility] called and left voice message stating there were 2 medication orders in patients Rx that was sent. He is requesting clarification on Rx if the provider is wanting the patient to have Magic Mouth Wash.

## 2010-11-13 NOTE — Telephone Encounter (Signed)
Call returned to pharmacist at  [facility] [phone] , he was provide clarification on Rx to dispense Magic Mouth Wash to patient.

## 2010-11-14 ENCOUNTER — Telehealth: Payer: Self-pay | Admitting: Critical Care Medicine

## 2010-11-14 NOTE — Telephone Encounter (Signed)
Received refill request from [facility] for MMW #240 ml swish and swallow 5 to 10 ml q4h. PW, pls advise if this is okay to refill for her, thanks!

## 2010-11-15 MED ORDER — DIPHENHYD-HYDROCORT-NYSTATIN MT SUSP: OROMUCOSAL | Status: DC

## 2010-11-15 NOTE — Telephone Encounter (Signed)
Ok to refill 

## 2010-11-15 NOTE — Telephone Encounter (Signed)
RX has been sent to the pharmacy.

## 2010-11-16 DIAGNOSIS — K1379 Other lesions of oral mucosa: Secondary | ICD-10-CM | POA: Insufficient documentation

## 2010-11-16 NOTE — Assessment & Plan Note (Signed)
Recent abx and steroid use. No clinical evidence of thrush. Attempt magic mouthwash. Followup if no improvement or worsening.

## 2010-11-16 NOTE — Assessment & Plan Note (Signed)
Recent possible infectious trigger for flare. With abn lung exam today will proceed with cxr.

## 2010-11-18 ENCOUNTER — Ambulatory Visit
Admission: RE | Admit: 2010-11-18 | Discharge: 2010-11-18 | Disposition: A | Discharge status: Home / Self Care | Payer: Medicare Other | Source: Ambulatory Visit | Attending: Neurosurgery | Admitting: Neurosurgery

## 2010-11-18 DIAGNOSIS — M4120 Other idiopathic scoliosis, site unspecified: Secondary | ICD-10-CM

## 2010-12-05 ENCOUNTER — Ambulatory Visit (INDEPENDENT_AMBULATORY_CARE_PROVIDER_SITE_OTHER): Payer: Medicare Other | Admitting: Critical Care Medicine

## 2010-12-05 ENCOUNTER — Encounter: Payer: Self-pay | Admitting: Critical Care Medicine

## 2010-12-05 VITALS — BP 130/86 | HR 82 | Temp 97.9°F | Ht 60.0 in | Wt 130.0 lb

## 2010-12-05 DIAGNOSIS — Z23 Encounter for immunization: Secondary | ICD-10-CM

## 2010-12-05 DIAGNOSIS — J449 Chronic obstructive pulmonary disease, unspecified: Secondary | ICD-10-CM

## 2010-12-05 DIAGNOSIS — J4489 Other specified chronic obstructive pulmonary disease: Secondary | ICD-10-CM

## 2010-12-05 NOTE — Progress Notes (Signed)
Subjective:    Patient ID: [name] [name], female    DOB: [birth year], 77 y.o.   MRN: [id number]  HPI 77 y.o.WF with COPD   10/4 At last ov we rx zpak and pred pulse,  Not any better. Still coughing sl yellow.  Still dyspneic with activity.  No real abdominal pain.  Occ wheeze.  No edema in feet.  No f/c but + sweats.  11/8 Viral syndrome still, still dyspneic.  Sl cough , sl yellow.  Occ wheeze.   At last ov we rec: Stay on inhalers USe veramyst two sprays each nostril daily until sample gone, no refill Augmentin one twice daily for 10days Recycle prednisone 10mg  Take 4 for three days 3 for three days 2 for three days 1 for three days and stop Diflucan /fluconazole 100mg  two once then one daily for mouth sores After last ov had developed a viral illness.  Mouth still sore.  Past Medical History  Diagnosis Date  . Osteoporosis   . Arthritis     osteoarthritis  . GERD (gastroesophageal reflux disease)   . Hiatal hernia   . COPD (chronic obstructive pulmonary disease)   . Pneumonia   . Hypotension   . PUD (peptic ulcer disease)   . Cardiac arrhythmia   . Hyperlipidemia   . Heart failure   . Hypertension   . Scoliosis deformity of spine     history of intractable back pain  . Choledocholithiasis   . Pancolitis     Infectious vs. inflammatory  . Abdominal pain     Resolved  . Tachycardia   . Chronic anemia      Family History  Problem Relation Age of Onset  . Cancer Mother     uterine  . Heart disease Father   . Hypertension Other      History   Social History  . Marital Status: Widowed    Spouse Name: N/A    Number of Children: 3  . Years of Education: N/A   Occupational History  .     Social History Main Topics  . Smoking status: Former Smoker -- 1.0 packs/day for 12 years    Types: Cigarettes    Quit date: 01/28/1988  . Smokeless tobacco: Never Used  . Alcohol Use: No  . Drug Use: Not on file  . Sexually Active: Not on file   Other Topics  Concern  . Not on file   Social History Narrative   1 grandchild at care link--AngeliaLives with great-granddaughter     Allergies  Allergen Reactions  . Codeine     REACTION: n/v/d, HA  . Morphine     REACTION: n/v/d, HA  . Zoledronic Acid     REACTION: Severe edema     Outpatient Prescriptions Prior to Visit  Medication Sig Dispense Refill  . albuterol (VENTOLIN HFA) 108 (90 BASE) MCG/ACT inhaler Inhale 1 puff into the lungs 3 (three) times daily as needed.        . Alum & Mag Hydroxide-Simeth (MAGIC MOUTHWASH) SOLN Take 5 mLs by mouth 3 (three) times daily.  75 mL  1  . Ascorbic Acid (VITAMIN C) 500 MG tablet Take 500 mg by mouth daily.        [name] aspirin 81 MG tablet Take 81 mg by mouth daily.        . budesonide-formoterol (SYMBICORT) 160-4.5 MCG/ACT inhaler Inhale 2 puffs into the lungs 2 (two) times daily.        . butalbital-acetaminophen-caffeine (  FIORICET) 50-325-40 MG per tablet Take 1 tablet by mouth every 8 (eight) hours as needed.        . Calcium Carbonate-Vitamin D (CALCIUM 600-D) 600-400 MG-UNIT per tablet Take 1 tablet by mouth 2 (two) times daily with a meal.        . Cholecalciferol (VITAMIN D PO) Take 1 tablet by mouth daily.        . cyclobenzaprine (FLEXERIL) 10 MG tablet Take 1 tablet by mouth Three times a day.      . ferrous fumarate-iron polysaccharide complex (TANDEM) 162-115.2 MG CAPS Take 1 capsule by mouth daily.        . fluticasone (VERAMYST) 27.5 MCG/SPRAY nasal spray Place 2 sprays into the nose daily.  10 g  0  . gabapentin (NEURONTIN) 100 MG capsule Take 1 capsule (100 mg total) by mouth 3 (three) times daily.  90 capsule  0  . HYDROmorphone HCl (DILAUDID PO) Take by mouth. 2-3 times daily       . Lactulose SOLN Take 2 mLs by mouth every evening.  500 mL  2  . nebivolol (BYSTOLIC) 5 MG tablet Take 1/2 tablet by mouth daily.       . nitroGLYCERIN (NITROSTAT) 0.4 MG SL tablet Place 1 tablet (0.4 mg total) under the tongue every 5 (five) minutes as  needed for chest pain.  100 tablet  3  . NON FORMULARY OXYGEN. 2L at night       . omeprazole (PRILOSEC) 40 MG capsule Take 20 mg by mouth daily.       . promethazine (PHENERGAN) 25 MG tablet Take by mouth as needed.      [name] Respiratory Therapy Supplies (FLUTTER) DEVI Use 3-4 times daily.      [name] Spacer/Aero-Holding Chambers (AEROCHAMBER MV) inhaler Use with Symbicort.       . tiotropium (SPIRIVA) 18 MCG inhalation capsule Place 18 mcg into inhaler and inhale daily.        . Diphenhyd-Hydrocort-Nystatin SUSP Swish and swallow 5 to 10 ml every 4 hours as needed  240 mL  0      Review of Systems  Constitutional:   No  weight loss, night sweats,  Fevers, chills, fatigue, lassitude. HEENT:   No headaches,  Difficulty swallowing,  Tooth/dental problems,  Sore throat,                No sneezing, itching, ear ache, nasal congestion, post nasal drip,   CV:  No chest pain,  Orthopnea, PND, swelling in lower extremities, anasarca, dizziness, palpitations  GI  No heartburn, indigestion, abdominal pain, nausea, vomiting, diarrhea, change in bowel habits, loss of appetite  Resp: Notes shortness of breath with exertion and at rest.  Notes  excess mucus, notes  productive cough,  No non-productive cough,  No coughing up of blood.  No change in color of mucus.  No wheezing.  No chest wall deformity  Skin: no rash or lesions.  GU: no dysuria, change in color of urine, no urgency or frequency.  No flank pain.  MS:  No joint pain or swelling.  No decreased range of motion.  No back pain.  Psych:  No change in mood or affect. No depression or anxiety.  No memory loss.     Objective:   Physical Exam  Filed Vitals:   12/05/10 1335  BP: 130/86  Pulse: 82  Temp: 97.9 F (36.6 C)  TempSrc: Oral  Height: 5' (1.524 m)  Weight: 130 lb (58.968 kg)  SpO2: 96%    Gen: Pleasant, well-nourished, in no distress,  normal affect  ENT: No lesions,  mouth clear,  oropharynx clear, no postnasal  drip  Neck: No JVD, no TMG, no carotid bruits  Lungs: No use of accessory muscles, no dullness to percussion,no wheeze  Cardiovascular: RRR, heart sounds normal, no murmur or gallops, no peripheral edema  Abdomen: soft and NT, no HSM,  BS normal  Musculoskeletal: No deformities, no cyanosis or clubbing  Neuro: alert, non focal  Skin: Warm, no lesions or rashes        Assessment & Plan:   COPD REcent copd exacerbation Plan No change in inhaled or maintenance medications. Return in   2 months     Updated Medication List Outpatient Encounter Prescriptions as of 12/05/2010  Medication Sig Dispense Refill  . albuterol (VENTOLIN HFA) 108 (90 BASE) MCG/ACT inhaler Inhale 1 puff into the lungs 3 (three) times daily as needed.        . Alum & Mag Hydroxide-Simeth (MAGIC MOUTHWASH) SOLN Take 5 mLs by mouth 3 (three) times daily.  75 mL  1  . Ascorbic Acid (VITAMIN C) 500 MG tablet Take 500 mg by mouth daily.        [name] aspirin 81 MG tablet Take 81 mg by mouth daily.        . budesonide-formoterol (SYMBICORT) 160-4.5 MCG/ACT inhaler Inhale 2 puffs into the lungs 2 (two) times daily.        . butalbital-acetaminophen-caffeine (FIORICET) 50-325-40 MG per tablet Take 1 tablet by mouth every 8 (eight) hours as needed.        . Calcium Carbonate-Vitamin D (CALCIUM 600-D) 600-400 MG-UNIT per tablet Take 1 tablet by mouth 2 (two) times daily with a meal.        . Cholecalciferol (VITAMIN D PO) Take 1 tablet by mouth daily.        . cyclobenzaprine (FLEXERIL) 10 MG tablet Take 1 tablet by mouth Three times a day.      . ferrous fumarate-iron polysaccharide complex (TANDEM) 162-115.2 MG CAPS Take 1 capsule by mouth daily.        . fluticasone (VERAMYST) 27.5 MCG/SPRAY nasal spray Place 2 sprays into the nose daily.  10 g  0  . gabapentin (NEURONTIN) 100 MG capsule Take 1 capsule (100 mg total) by mouth 3 (three) times daily.  90 capsule  0  . HYDROmorphone HCl (DILAUDID PO) Take by mouth. 2-3  times daily       . Lactulose SOLN Take 2 mLs by mouth every evening.  500 mL  2  . nebivolol (BYSTOLIC) 5 MG tablet Take 1/2 tablet by mouth daily.       . nitroGLYCERIN (NITROSTAT) 0.4 MG SL tablet Place 1 tablet (0.4 mg total) under the tongue every 5 (five) minutes as needed for chest pain.  100 tablet  3  . NON FORMULARY OXYGEN. 2L at night       . omeprazole (PRILOSEC) 40 MG capsule Take 20 mg by mouth daily.       . promethazine (PHENERGAN) 25 MG tablet Take by mouth as needed.      [name] Respiratory Therapy Supplies (FLUTTER) DEVI Use 3-4 times daily.      [name] Spacer/Aero-Holding Chambers (AEROCHAMBER MV) inhaler Use with Symbicort.       . tiotropium (SPIRIVA) 18 MCG inhalation capsule Place 18 mcg into inhaler and inhale daily.        [name] DISCONTD: Diphenhyd-Hydrocort-Nystatin SUSP Swish and  swallow 5 to 10 ml every 4 hours as needed  240 mL  0

## 2010-12-05 NOTE — Patient Instructions (Signed)
No change in medications. Return in       2 months Flu vaccine

## 2010-12-06 NOTE — Assessment & Plan Note (Signed)
REcent copd exacerbation Plan No change in inhaled or maintenance medications. Return in   2 months

## 2010-12-20 ENCOUNTER — Telehealth: Payer: Self-pay | Admitting: Internal Medicine

## 2010-12-20 MED ORDER — GABAPENTIN 100 MG PO CAPS: 100.0000 mg | ORAL_CAPSULE | Freq: Three times a day (TID) | ORAL | Status: DC

## 2010-12-20 MED ORDER — NEBIVOLOL HCL 5 MG PO TABS: ORAL_TABLET | ORAL | Status: DC

## 2010-12-20 NOTE — Telephone Encounter (Signed)
Rx refill sent to [facility].

## 2010-12-20 NOTE — Telephone Encounter (Signed)
Refill- gabapentin 100mg  cap. Take one capsule three times each day. Qty 90. Last fill 7.18.12

## 2010-12-20 NOTE — Telephone Encounter (Signed)
Refill- bystolic 5mg  tab. Take 1/2 tablet by mouth every day. Qty 30. Last fill 8.24.12

## 2011-02-11 DIAGNOSIS — M545 Low back pain, unspecified: Secondary | ICD-10-CM | POA: Diagnosis not present

## 2011-02-11 DIAGNOSIS — Z462 Encounter for fitting and adjustment of other devices related to nervous system and special senses: Secondary | ICD-10-CM | POA: Diagnosis not present

## 2011-02-11 DIAGNOSIS — M5137 Other intervertebral disc degeneration, lumbosacral region: Secondary | ICD-10-CM | POA: Diagnosis not present

## 2011-03-05 IMAGING — CT CT ABD-PELV W/ CM
2 of 5 series · 17 of 46 positions shown, 19 images · IV contrast (APPLIED)
Comparison: CT abdomen pelvis 01/24/2010 most recent.

CLINICAL DATA: Status post ERCP.  Fever with nausea and vomiting.

CT ABDOMEN AND PELVIS WITH CONTRAST
TECHNIQUE: Multidetector CT imaging of the abdomen and pelvis was
performed following the standard protocol during bolus
administration of intravenous contrast.
Contrast: 9mnipaque-9JJ, 100 ml.

[Series 2: abd_pel 5.0 b40f st · axial · 0.75mm/px · z∈[-325,+15]mm · 14 of 76 slices shown, 16 images]
[im 4/76  soft-tissue]
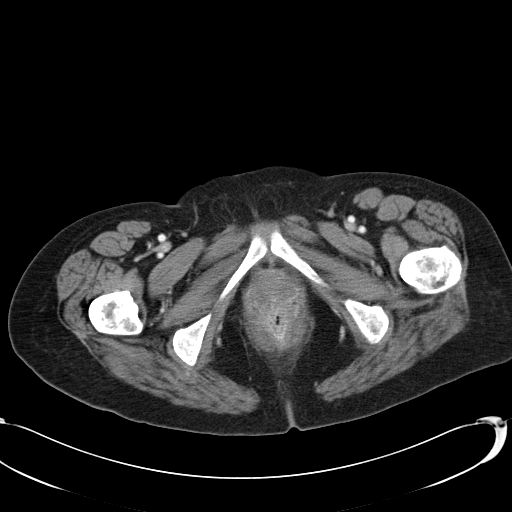
[im 4/76  bone]
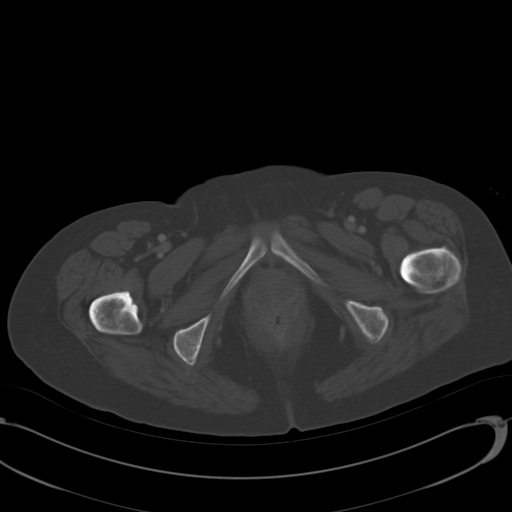
[im 8/76  soft-tissue]
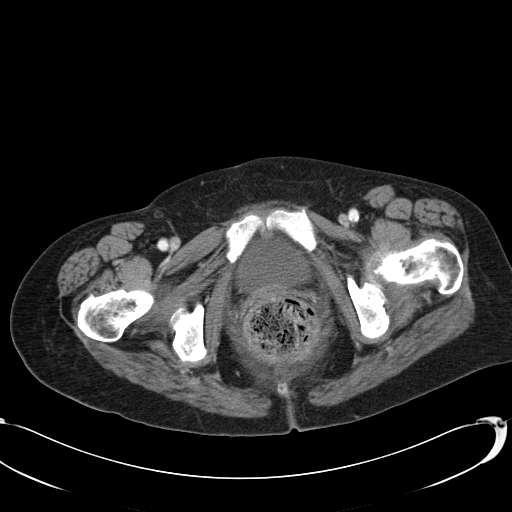
[im 16/76  soft-tissue]
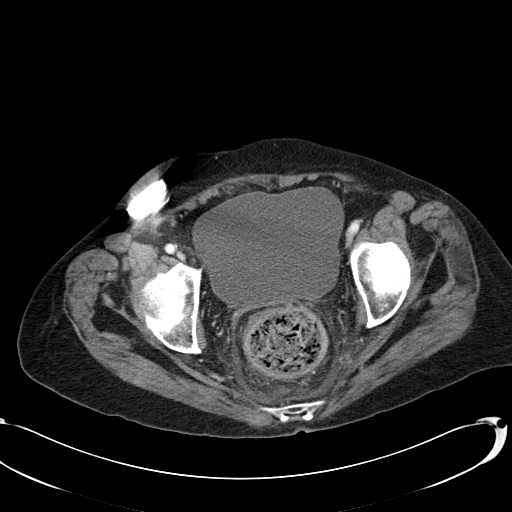
[im 20/76  soft-tissue]
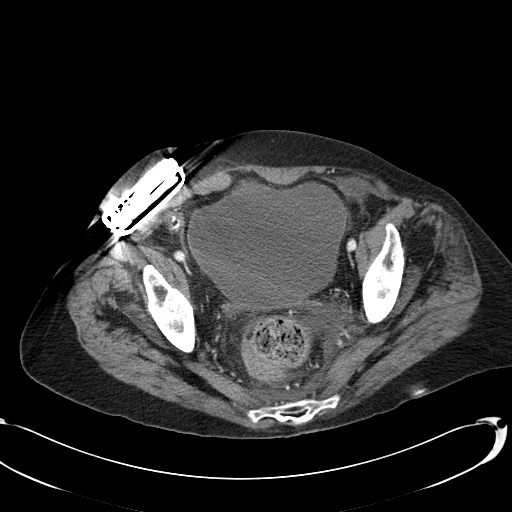
[im 24/76  soft-tissue]
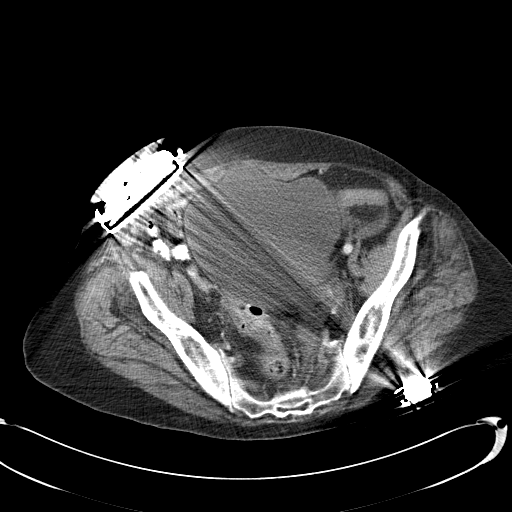
[im 32/76  soft-tissue]
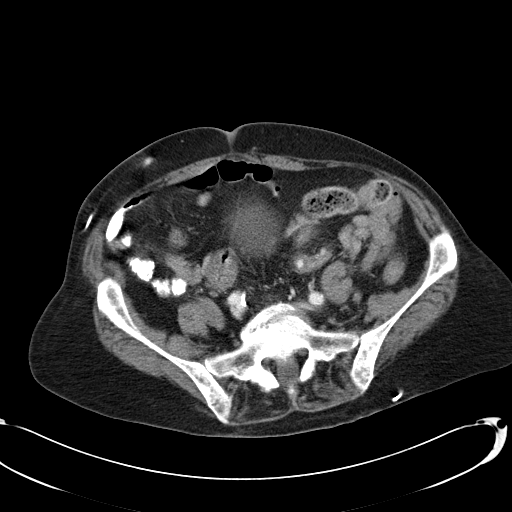
[im 36/76  soft-tissue]
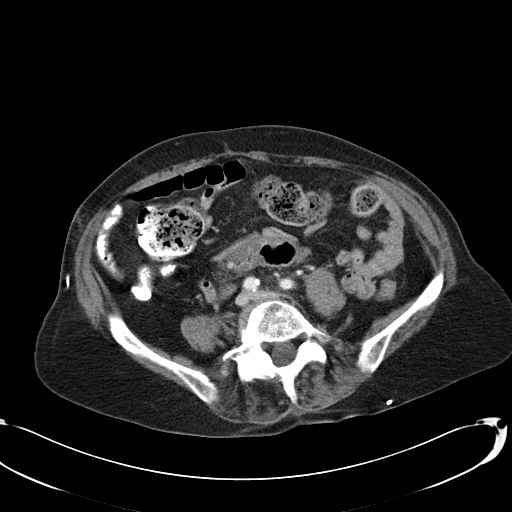
[im 40/76  soft-tissue]
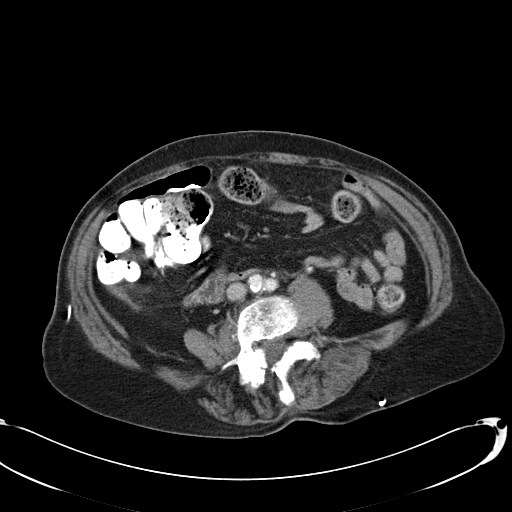
[im 44/76  soft-tissue]
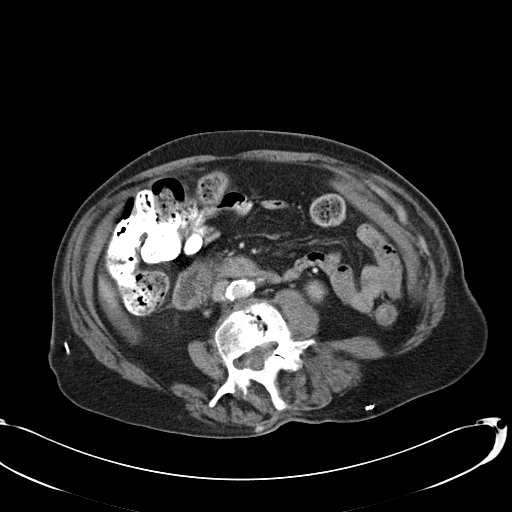
[im 44/76  bone]
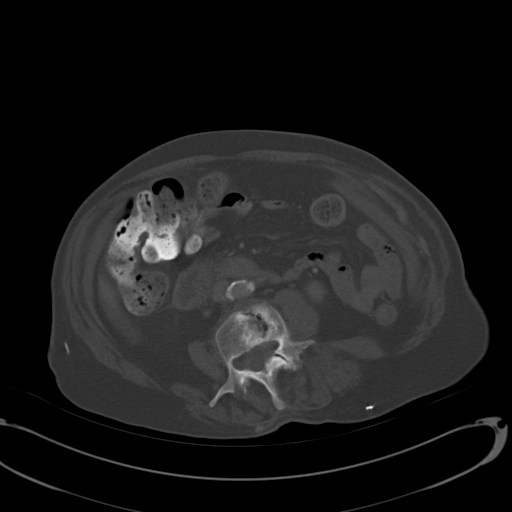
[im 52/76  soft-tissue]
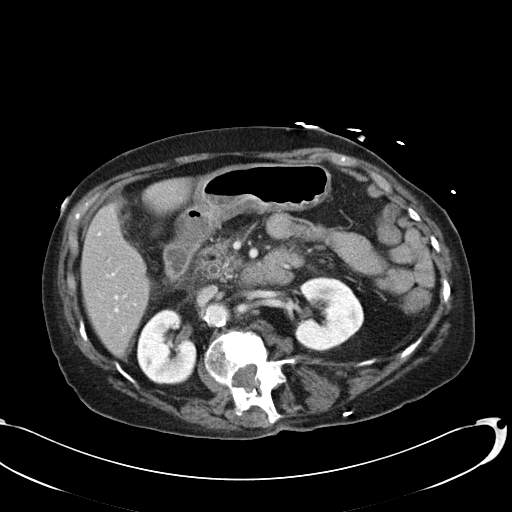
[im 56/76  soft-tissue]
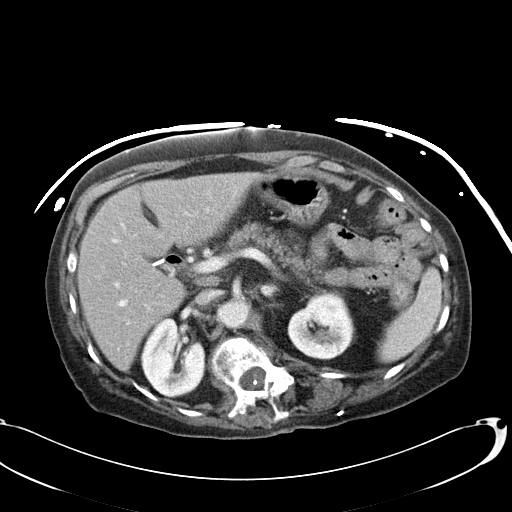
[im 60/76  soft-tissue]
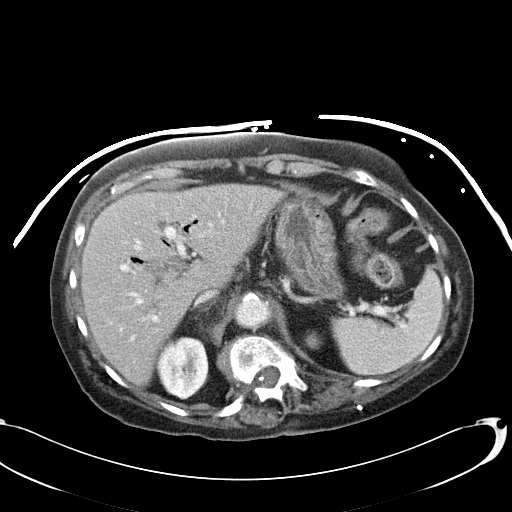
[im 68/76  soft-tissue]
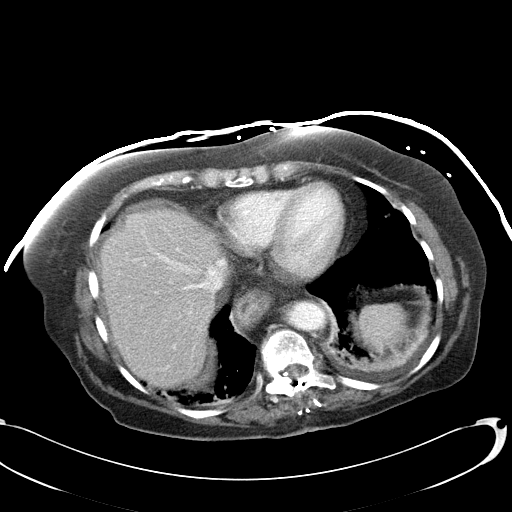
[im 72/76  soft-tissue]
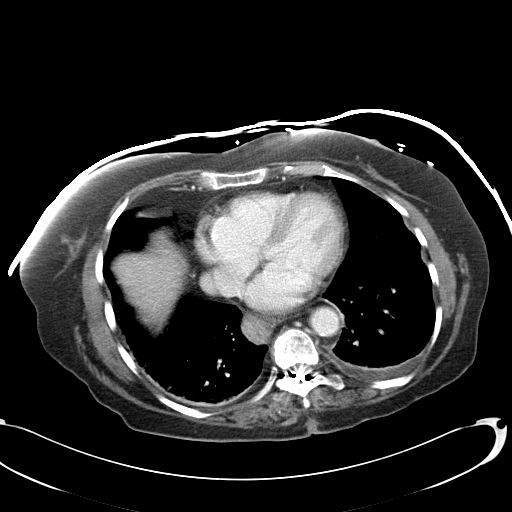

[Series 602: coronal abdomen · coronal · 0.77mm/px · 3 of 111 slices shown]
[im 37/111  soft-tissue]
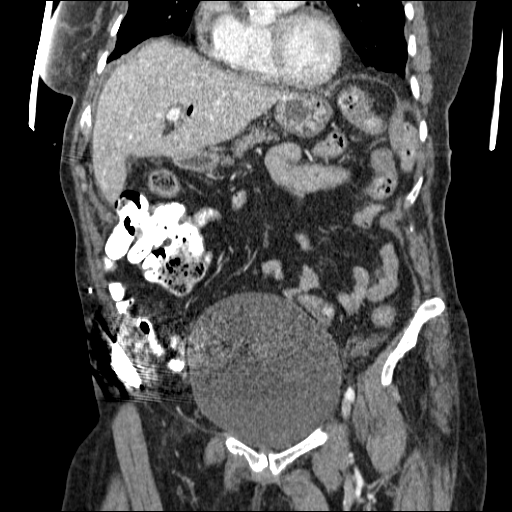
[im 49/111  soft-tissue]
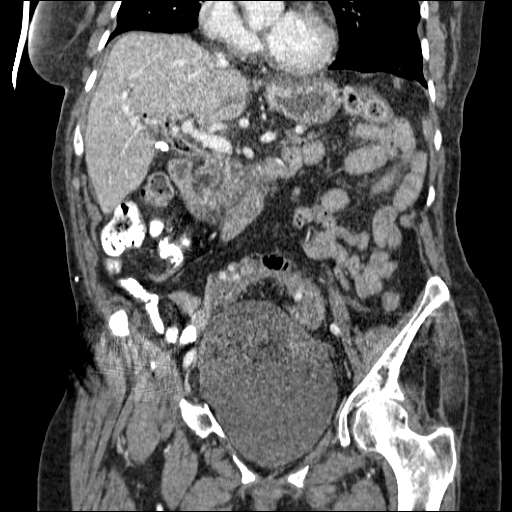
[im 62/111  soft-tissue]
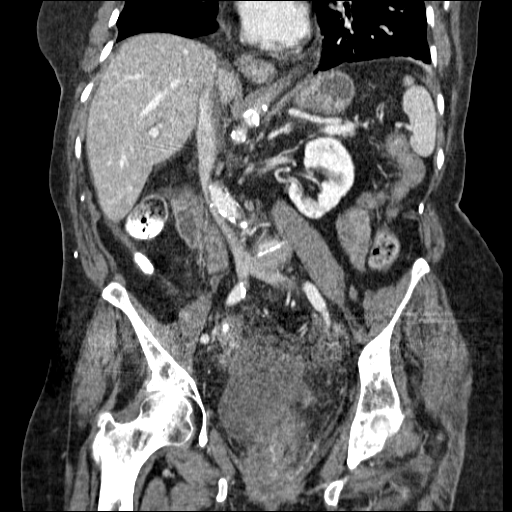

[17 of 46 positions shown; findings below may reference images not displayed]

FINDINGS: Normal post ERCP appearance of the liver with
pneumobilia.  Slightly diminished intra and extrahepatic biliary
ductal dilatation.  Common bile duct stent in good position.  Air
containing duodenal diverticulum is stable compared with priors.
No significant peripancreatic edema or retroperitoneal hemorrhage.
No extraluminal air in the retroperitoneum or peritoneal cavity.
No evidence for bowel obstruction.  Normal appearing kidneys.
Scoliosis.  Dorsal column stimulator.  No significant adenopathy.
No visible bowel obstruction.  Large fecal impaction.  Marked
bladder distention may require a Foley catheter. Advanced
degenerative disc disease lumbar spine with  bilateral hip
degenerative joint disease.  Small left pleural effusion.  Mild
atelectasis both lung bases.
IMPRESSION: No evidence for  procedural complications status post ERCP.
Pneumobilia with decreased caliber intra and extrahepatic biliary
ductal dilatation.  Unchanged duodenal diverticulum.  No intra-
abdominal or retroperitoneal free air.  Fecal impaction.  Marked
bladder distention.

## 2011-03-06 ENCOUNTER — Encounter: Payer: Self-pay | Admitting: Critical Care Medicine

## 2011-03-06 ENCOUNTER — Ambulatory Visit (INDEPENDENT_AMBULATORY_CARE_PROVIDER_SITE_OTHER): Payer: Medicare Other | Admitting: Critical Care Medicine

## 2011-03-06 ENCOUNTER — Other Ambulatory Visit: Payer: Self-pay | Admitting: Internal Medicine

## 2011-03-06 DIAGNOSIS — J449 Chronic obstructive pulmonary disease, unspecified: Secondary | ICD-10-CM | POA: Diagnosis not present

## 2011-03-06 DIAGNOSIS — J4489 Other specified chronic obstructive pulmonary disease: Secondary | ICD-10-CM

## 2011-03-06 MED ORDER — AMOXICILLIN-POT CLAVULANATE 875-125 MG PO TABS: 1.0000 | ORAL_TABLET | Freq: Two times a day (BID) | ORAL | Status: AC

## 2011-03-06 NOTE — Assessment & Plan Note (Signed)
Asthmatic bronchitis with acute flare of copd Plan augmentin x 7days No change inhaled meds

## 2011-03-06 NOTE — Progress Notes (Signed)
Subjective:    Patient ID: [name] [name], female    DOB: 1925-05-12, 78 y.o.   MRN: [id number]  HPI 79 y.o.WF with COPD   03/06/2011 Noting more congestion over 1 week.  Mucus now is yellow. No chest pain,  Tightness and wheezing.  Noting more dyspnea at rest.  No edema in feet.  Notes some pndrip.  Notes occ heartburn.   Past Medical History  Diagnosis Date  . Osteoporosis   . Arthritis     osteoarthritis  . GERD (gastroesophageal reflux disease)   . Hiatal hernia   . COPD (chronic obstructive pulmonary disease)   . Pneumonia   . Hypotension   . PUD (peptic ulcer disease)   . Cardiac arrhythmia   . Hyperlipidemia   . Heart failure   . Hypertension   . Scoliosis deformity of spine     history of intractable back pain  . Choledocholithiasis   . Pancolitis     Infectious vs. inflammatory  . Abdominal pain     Resolved  . Tachycardia   . Chronic anemia      Family History  Problem Relation Age of Onset  . Cancer Mother     uterine  . Heart disease Father   . Hypertension Other      History   Social History  . Marital Status: Widowed    Spouse Name: N/A    Number of Children: 3  . Years of Education: N/A   Occupational History  .     Social History Main Topics  . Smoking status: Former Smoker -- 1.0 packs/day for 12 years    Types: Cigarettes    Quit date: 01/28/1988  . Smokeless tobacco: Never Used  . Alcohol Use: No  . Drug Use: Not on file  . Sexually Active: Not on file   Other Topics Concern  . Not on file   Social History Narrative   1 grandchild at care link--AngeliaLives with great-granddaughter     Allergies  Allergen Reactions  . Codeine     REACTION: n/v/d, HA  . Morphine     REACTION: n/v/d, HA  . Zoledronic Acid     REACTION: Severe edema     Outpatient Prescriptions Prior to Visit  Medication Sig Dispense Refill  . albuterol (VENTOLIN HFA) 108 (90 BASE) MCG/ACT inhaler Inhale 1 puff into the lungs 3 (three) times daily as  needed.        . Alum & Mag Hydroxide-Simeth (MAGIC MOUTHWASH) SOLN Take 5 mLs by mouth 3 (three) times daily.  75 mL  1  . Ascorbic Acid (VITAMIN C) 500 MG tablet Take 500 mg by mouth daily.        [name] aspirin 81 MG tablet Take 81 mg by mouth daily.        . budesonide-formoterol (SYMBICORT) 160-4.5 MCG/ACT inhaler Inhale 2 puffs into the lungs 2 (two) times daily.        . butalbital-acetaminophen-caffeine (FIORICET) 50-325-40 MG per tablet Take 1 tablet by mouth every 8 (eight) hours as needed.        . Calcium Carbonate-Vitamin D (CALCIUM 600-D) 600-400 MG-UNIT per tablet Take 1 tablet by mouth 2 (two) times daily with a meal.        . Cholecalciferol (VITAMIN D PO) Take 1 tablet by mouth daily.        . cyclobenzaprine (FLEXERIL) 10 MG tablet Take 1 tablet by mouth 3 (three) times daily as needed.       [name]  ferrous fumarate-iron polysaccharide complex (TANDEM) 162-115.2 MG CAPS Take 1 capsule by mouth daily.        . fluticasone (VERAMYST) 27.5 MCG/SPRAY nasal spray Place 2 sprays into the nose daily.  10 g  0  . HYDROmorphone HCl (DILAUDID PO) Take by mouth. 2-3 times daily       . Lactulose SOLN Take 2 mLs by mouth every evening.  500 mL  2  . nebivolol (BYSTOLIC) 5 MG tablet Take 1/2 tablet by mouth daily.  30 tablet  2  . nitroGLYCERIN (NITROSTAT) 0.4 MG SL tablet Place 1 tablet (0.4 mg total) under the tongue every 5 (five) minutes as needed for chest pain.  100 tablet  3  . NON FORMULARY OXYGEN. 2L at night       . omeprazole (PRILOSEC) 40 MG capsule Take 20 mg by mouth daily.       . promethazine (PHENERGAN) 25 MG tablet Take by mouth as needed.      [name] Respiratory Therapy Supplies (FLUTTER) DEVI Use 3-4 times daily.      [name] Spacer/Aero-Holding Chambers (AEROCHAMBER MV) inhaler Use with Symbicort.       . tiotropium (SPIRIVA) 18 MCG inhalation capsule Place 18 mcg into inhaler and inhale daily.        [name] gabapentin (NEURONTIN) 100 MG capsule Take 1 capsule (100 mg total) by mouth 3 (three)  times daily.  90 capsule  3      Review of Systems  Constitutional:   No  weight loss, night sweats,  Fevers, chills, fatigue, lassitude. HEENT:   No headaches,  Difficulty swallowing,  Tooth/dental problems,  Sore throat,                No sneezing, itching, ear ache, nasal congestion, post nasal drip,   CV:  No chest pain,  Orthopnea, PND, swelling in lower extremities, anasarca, dizziness, palpitations  GI  No heartburn, indigestion, abdominal pain, nausea, vomiting, diarrhea, change in bowel habits, loss of appetite  Resp: Notes shortness of breath with exertion and at rest.  Notes  excess mucus, notes  productive cough,  No non-productive cough,  No coughing up of blood.  No change in color of mucus.  No wheezing.  No chest wall deformity  Skin: no rash or lesions.  GU: no dysuria, change in color of urine, no urgency or frequency.  No flank pain.  MS:  No joint pain or swelling.  No decreased range of motion.  No back pain.  Psych:  No change in mood or affect. No depression or anxiety.  No memory loss.     Objective:   Physical Exam  Filed Vitals:   03/06/11 1448  BP: 130/66  Pulse: 72  Temp: 98.3 F (36.8 C)  TempSrc: Oral  Height: 5\' 2"  (1.575 m)  Weight: 127 lb (57.607 kg)  SpO2: 92%    Gen: Pleasant, well-nourished, in no distress,  normal affect  ENT: No lesions,  mouth clear,  oropharynx clear, no postnasal drip  Neck: No JVD, no TMG, no carotid bruits  Lungs: No use of accessory muscles, no dullness to percussion,no wheeze  Cardiovascular: RRR, heart sounds normal, no murmur or gallops, no peripheral edema  Abdomen: soft and NT, no HSM,  BS normal  Musculoskeletal: No deformities, no cyanosis or clubbing  Neuro: alert, non focal  Skin: Warm, no lesions or rashes        Assessment & Plan:   COPD Asthmatic bronchitis with acute flare of  copd Plan augmentin x 7days No change inhaled meds      Updated Medication List Outpatient  Encounter Prescriptions as of 03/06/2011  Medication Sig Dispense Refill  . albuterol (VENTOLIN HFA) 108 (90 BASE) MCG/ACT inhaler Inhale 1 puff into the lungs 3 (three) times daily as needed.        . Alum & Mag Hydroxide-Simeth (MAGIC MOUTHWASH) SOLN Take 5 mLs by mouth 3 (three) times daily.  75 mL  1  . Ascorbic Acid (VITAMIN C) 500 MG tablet Take 500 mg by mouth daily.        [name] aspirin 81 MG tablet Take 81 mg by mouth daily.        . budesonide-formoterol (SYMBICORT) 160-4.5 MCG/ACT inhaler Inhale 2 puffs into the lungs 2 (two) times daily.        . butalbital-acetaminophen-caffeine (FIORICET) 50-325-40 MG per tablet Take 1 tablet by mouth every 8 (eight) hours as needed.        . Calcium Carbonate-Vitamin D (CALCIUM 600-D) 600-400 MG-UNIT per tablet Take 1 tablet by mouth 2 (two) times daily with a meal.        . Cholecalciferol (VITAMIN D PO) Take 1 tablet by mouth daily.        . cyclobenzaprine (FLEXERIL) 10 MG tablet Take 1 tablet by mouth 3 (three) times daily as needed.       . ferrous fumarate-iron polysaccharide complex (TANDEM) 162-115.2 MG CAPS Take 1 capsule by mouth daily.        . fluticasone (VERAMYST) 27.5 MCG/SPRAY nasal spray Place 2 sprays into the nose daily.  10 g  0  . gabapentin (NEURONTIN) 100 MG capsule Take 100 mg by mouth 2 (two) times daily.      [name] HYDROmorphone HCl (DILAUDID PO) Take by mouth. 2-3 times daily       . Lactulose SOLN Take 2 mLs by mouth every evening.  500 mL  2  . nebivolol (BYSTOLIC) 5 MG tablet Take 1/2 tablet by mouth daily.  30 tablet  2  . nitroGLYCERIN (NITROSTAT) 0.4 MG SL tablet Place 1 tablet (0.4 mg total) under the tongue every 5 (five) minutes as needed for chest pain.  100 tablet  3  . NON FORMULARY OXYGEN. 2L at night       . omeprazole (PRILOSEC) 40 MG capsule Take 20 mg by mouth daily.       . promethazine (PHENERGAN) 25 MG tablet Take by mouth as needed.      [name] Respiratory Therapy Supplies (FLUTTER) DEVI Use 3-4 times daily.        [name] Spacer/Aero-Holding Chambers (AEROCHAMBER MV) inhaler Use with Symbicort.       . tiotropium (SPIRIVA) 18 MCG inhalation capsule Place 18 mcg into inhaler and inhale daily.        [name] DISCONTD: gabapentin (NEURONTIN) 100 MG capsule Take 1 capsule (100 mg total) by mouth 3 (three) times daily.  90 capsule  3  . amoxicillin-clavulanate (AUGMENTIN) 875-125 MG per tablet Take 1 tablet by mouth 2 (two) times daily.  14 tablet  0

## 2011-03-06 NOTE — Patient Instructions (Signed)
Take augmentin one twice daily for 7days Can use mucinex as needed for cough  (1200mg ) Stay on inhalers Return 2 months

## 2011-03-06 NOTE — Telephone Encounter (Signed)
Verified with pharmacy that pt still has refill on file for February. Current request denied.

## 2011-03-07 ENCOUNTER — Other Ambulatory Visit: Payer: Self-pay | Admitting: Internal Medicine

## 2011-03-07 NOTE — Telephone Encounter (Signed)
Pt has follow up on 03/18/11.  RX sent.

## 2011-03-11 ENCOUNTER — Ambulatory Visit: Payer: Medicare Other | Admitting: Internal Medicine

## 2011-03-11 DIAGNOSIS — M545 Low back pain, unspecified: Secondary | ICD-10-CM | POA: Diagnosis not present

## 2011-03-11 DIAGNOSIS — M5137 Other intervertebral disc degeneration, lumbosacral region: Secondary | ICD-10-CM | POA: Diagnosis not present

## 2011-03-11 DIAGNOSIS — G894 Chronic pain syndrome: Secondary | ICD-10-CM | POA: Diagnosis not present

## 2011-03-11 DIAGNOSIS — Z462 Encounter for fitting and adjustment of other devices related to nervous system and special senses: Secondary | ICD-10-CM | POA: Diagnosis not present

## 2011-03-18 ENCOUNTER — Ambulatory Visit (INDEPENDENT_AMBULATORY_CARE_PROVIDER_SITE_OTHER): Payer: Medicare Other | Admitting: Internal Medicine

## 2011-03-18 ENCOUNTER — Encounter: Payer: Self-pay | Admitting: Internal Medicine

## 2011-03-18 VITALS — BP 102/60 | HR 63 | Temp 98.0°F | Resp 20 | Ht 61.0 in | Wt 128.0 lb

## 2011-03-18 DIAGNOSIS — E559 Vitamin D deficiency, unspecified: Secondary | ICD-10-CM

## 2011-03-18 DIAGNOSIS — I10 Essential (primary) hypertension: Secondary | ICD-10-CM | POA: Diagnosis not present

## 2011-03-18 DIAGNOSIS — Z79899 Other long term (current) drug therapy: Secondary | ICD-10-CM

## 2011-03-18 DIAGNOSIS — J069 Acute upper respiratory infection, unspecified: Secondary | ICD-10-CM | POA: Insufficient documentation

## 2011-03-18 LAB — CBC WITH DIFFERENTIAL/PLATELET
Basophils Absolute: 0 10*3/uL (ref 0.0–0.1)
Basophils Relative: 0 % (ref 0–1)
Eosinophils Absolute: 0.1 10*3/uL (ref 0.0–0.7)
Eosinophils Relative: 3 % (ref 0–5)
HCT: 38.4 % (ref 36.0–46.0)
Hemoglobin: 12.7 g/dL (ref 12.0–15.0)
Lymphocytes Relative: 23 % (ref 12–46)
Lymphs Abs: 1.2 10*3/uL (ref 0.7–4.0)
MCH: 30.7 pg (ref 26.0–34.0)
MCHC: 33.1 g/dL (ref 30.0–36.0)
MCV: 92.8 fL (ref 78.0–100.0)
Monocytes Absolute: 0.5 10*3/uL (ref 0.1–1.0)
Monocytes Relative: 9 % (ref 3–12)
Neutro Abs: 3.4 10*3/uL (ref 1.7–7.7)
Neutrophils Relative %: 65 % (ref 43–77)
Platelets: 177 10*3/uL (ref 150–400)
RBC: 4.14 MIL/uL (ref 3.87–5.11)
RDW: 12.9 % (ref 11.5–15.5)
WBC: 5.2 10*3/uL (ref 4.0–10.5)

## 2011-03-18 NOTE — Progress Notes (Signed)
  Subjective:    Patient ID: [name] [name], female    DOB: Oct 07, 1925, 78 y.o.   MRN: [id number]  HPI Pt presents to clinic for followup of multiple medical problems. Notes 3d h/o throat irritation and nasal congestion now improving. Denies f/c or cough. Taking mucinex with improvement. No other alleviating or exacerbating factors. Wt stable and bp well controlled.   Past Medical History  Diagnosis Date  . Osteoporosis   . Arthritis     osteoarthritis  . GERD (gastroesophageal reflux disease)   . Hiatal hernia   . COPD (chronic obstructive pulmonary disease)   . Pneumonia   . Hypotension   . PUD (peptic ulcer disease)   . Cardiac arrhythmia   . Hyperlipidemia   . Heart failure   . Hypertension   . Scoliosis deformity of spine     history of intractable back pain  . Choledocholithiasis   . Pancolitis     Infectious vs. inflammatory  . Abdominal pain     Resolved  . Tachycardia   . Chronic anemia    Past Surgical History  Procedure Date  . Cholecystectomy 2008  . Abdominal hysterectomy 1970s  . Spine surgery [phone]  . Neck surgery 1970s  . Spinal cord stimulator implant   . Ercp w/ sphicterotomy 02/2010    reports that she quit smoking about 23 years ago. Her smoking use included Cigarettes. She has a 12 pack-year smoking history. She has never used smokeless tobacco. She reports that she does not drink alcohol. Her drug history not on file. family history includes Cancer in her mother; Heart disease in her father; and Hypertension in her other. Allergies  Allergen Reactions  . Codeine     REACTION: n/v/d, HA  . Morphine     REACTION: n/v/d, HA  . Zoledronic Acid     REACTION: Severe edema      Review of Systems see hpi     Objective:   Physical Exam  Physical Exam  Nursing note and vitals reviewed. Constitutional: Appears well-developed and well-nourished. No distress.  HENT: OP without erythema or exudate. Head: Normocephalic and atraumatic.    Right Ear: External ear normal.  Left Ear: External ear normal.  Eyes: Conjunctivae are normal. No scleral icterus.  Neck: Neck supple. Carotid bruit is not present.  Cardiovascular: Normal rate, regular rhythm and normal heart sounds.  Exam reveals no gallop and no friction rub.   No murmur heard. Pulmonary/Chest: Effort normal and breath sounds normal. No respiratory distress. He has no wheezes. no rales.  Lymphadenopathy:    He has no cervical adenopathy.  Neurological:Alert.  Skin: Skin is warm and dry. Not diaphoretic.  Psychiatric: Has a normal mood and affect.        Assessment & Plan:

## 2011-03-18 NOTE — Assessment & Plan Note (Signed)
Obtain vitamin d level 

## 2011-03-18 NOTE — Assessment & Plan Note (Signed)
Improving. Continue mucinex. If sx's do not continue to improve or worsen notify clinic

## 2011-03-18 NOTE — Assessment & Plan Note (Signed)
Good control. If trend continues consider dose reduction. Obtain cbc, chem7

## 2011-03-19 LAB — BASIC METABOLIC PANEL
BUN: 20 mg/dL (ref 6–23)
CO2: 31 mEq/L (ref 19–32)
Calcium: 8.7 mg/dL (ref 8.4–10.5)
Chloride: 102 mEq/L (ref 96–112)
Creat: 0.8 mg/dL (ref 0.50–1.10)
Glucose, Bld: 83 mg/dL (ref 70–99)
Potassium: 4.6 mEq/L (ref 3.5–5.3)
Sodium: 141 mEq/L (ref 135–145)

## 2011-03-19 LAB — HEPATIC FUNCTION PANEL
ALT: 13 U/L (ref 0–35)
AST: 21 U/L (ref 0–37)
Albumin: 4 g/dL (ref 3.5–5.2)
Alkaline Phosphatase: 140 U/L — ABNORMAL HIGH (ref 39–117)
Bilirubin, Direct: <0.1 mg/dL (ref 0.0–0.3)
Total Bilirubin: 0.2 mg/dL — ABNORMAL LOW (ref 0.3–1.2)
Total Protein: 6.2 g/dL (ref 6.0–8.3)

## 2011-03-19 LAB — VITAMIN D 25 HYDROXY (VIT D DEFICIENCY, FRACTURES): Vit D, 25-Hydroxy: 59 ng/mL (ref 30–89)

## 2011-03-27 ENCOUNTER — Ambulatory Visit: Payer: Medicare Other | Admitting: Critical Care Medicine

## 2011-04-07 DIAGNOSIS — G894 Chronic pain syndrome: Secondary | ICD-10-CM | POA: Diagnosis not present

## 2011-04-07 DIAGNOSIS — M545 Low back pain, unspecified: Secondary | ICD-10-CM | POA: Diagnosis not present

## 2011-04-07 DIAGNOSIS — Z462 Encounter for fitting and adjustment of other devices related to nervous system and special senses: Secondary | ICD-10-CM | POA: Diagnosis not present

## 2011-04-07 DIAGNOSIS — M5137 Other intervertebral disc degeneration, lumbosacral region: Secondary | ICD-10-CM | POA: Diagnosis not present

## 2011-04-30 ENCOUNTER — Telehealth: Payer: Self-pay | Admitting: *Deleted

## 2011-04-30 NOTE — Telephone Encounter (Signed)
I spoke with the pt and advised that PW will not be in the office on 05-01-11 in the PM so we need to R/s her appt. She asked that I call her daughter to arrange this because she is her transportation. I [id number] with [name].  Please speak with Philipp Deputy or Victorino Dike to r/s appt. Carron Curie, CMA

## 2011-04-30 NOTE — Telephone Encounter (Signed)
I spoke with the pt daughter and she request appt for 4-11. So appt set for 05-08-11 at 2pm. Carron Curie, CMA

## 2011-05-01 ENCOUNTER — Ambulatory Visit: Payer: Medicare Other | Admitting: Critical Care Medicine

## 2011-05-06 DIAGNOSIS — M545 Low back pain, unspecified: Secondary | ICD-10-CM | POA: Diagnosis not present

## 2011-05-06 DIAGNOSIS — G894 Chronic pain syndrome: Secondary | ICD-10-CM | POA: Diagnosis not present

## 2011-05-06 DIAGNOSIS — Z462 Encounter for fitting and adjustment of other devices related to nervous system and special senses: Secondary | ICD-10-CM | POA: Diagnosis not present

## 2011-05-06 DIAGNOSIS — M5137 Other intervertebral disc degeneration, lumbosacral region: Secondary | ICD-10-CM | POA: Diagnosis not present

## 2011-05-08 ENCOUNTER — Ambulatory Visit: Payer: Medicare Other | Admitting: Critical Care Medicine

## 2011-05-09 ENCOUNTER — Ambulatory Visit (INDEPENDENT_AMBULATORY_CARE_PROVIDER_SITE_OTHER): Payer: Medicare Other | Admitting: Adult Health

## 2011-05-09 ENCOUNTER — Encounter: Payer: Self-pay | Admitting: Adult Health

## 2011-05-09 VITALS — BP 100/52 | HR 77 | Temp 98.2°F | Ht 61.0 in | Wt 126.6 lb

## 2011-05-09 DIAGNOSIS — J449 Chronic obstructive pulmonary disease, unspecified: Secondary | ICD-10-CM | POA: Diagnosis not present

## 2011-05-09 DIAGNOSIS — J4489 Other specified chronic obstructive pulmonary disease: Secondary | ICD-10-CM

## 2011-05-09 NOTE — Progress Notes (Signed)
Subjective:    Patient ID: LEEYA RUSCONI, female    DOB: 1925/12/15, 78 y.o.   MRN: [id number]  HPI 78 y.o.WF with COPD   05/09/2011 Acute OV  2 month follow up COPD. Continues to have DOE, no significant change from her baseline. Does have occasional cough , yellow in early am, clears as day progresses. Occasional wheezing -on/off. No fever or hemoptysis. No weight loss. She continues on Spiriva and Symbicort. She denies any missed doses.  Past Medical History  Diagnosis Date  . Osteoporosis   . Arthritis     osteoarthritis  . GERD (gastroesophageal reflux disease)   . Hiatal hernia   . COPD (chronic obstructive pulmonary disease)   . Pneumonia   . Hypotension   . PUD (peptic ulcer disease)   . Cardiac arrhythmia   . Hyperlipidemia   . Heart failure   . Hypertension   . Scoliosis deformity of spine     history of intractable back pain  . Choledocholithiasis   . Pancolitis     Infectious vs. inflammatory  . Abdominal pain     Resolved  . Tachycardia   . Chronic anemia      Family History  Problem Relation Age of Onset  . Cancer Mother     uterine  . Heart disease Father   . Hypertension Other      History   Social History  . Marital Status: Widowed    Spouse Name: N/A    Number of Children: 3  . Years of Education: N/A   Occupational History  .     Social History Main Topics  . Smoking status: Former Smoker -- 1.0 packs/day for 12 years    Types: Cigarettes    Quit date: 01/28/1988  . Smokeless tobacco: Never Used  . Alcohol Use: No  . Drug Use: Not on file  . Sexually Active: Not on file   Other Topics Concern  . Not on file   Social History Narrative   1 grandchild at care link--AngeliaLives with great-granddaughter     Allergies  Allergen Reactions  . Codeine     REACTION: n/v/d, HA  . Morphine     REACTION: n/v/d, HA  . Zoledronic Acid     REACTION: Severe edema     Outpatient Prescriptions Prior to Visit  Medication Sig Dispense  Refill  . albuterol (VENTOLIN HFA) 108 (90 BASE) MCG/ACT inhaler Inhale 1 puff into the lungs 3 (three) times daily as needed.        . Alum & Mag Hydroxide-Simeth (MAGIC MOUTHWASH) SOLN Take 5 mLs by mouth 3 (three) times daily.  75 mL  1  . Ascorbic Acid (VITAMIN C) 500 MG tablet Take 500 mg by mouth daily.        [name] aspirin 81 MG tablet Take 81 mg by mouth daily.        . budesonide-formoterol (SYMBICORT) 160-4.5 MCG/ACT inhaler Inhale 2 puffs into the lungs 2 (two) times daily.        . butalbital-acetaminophen-caffeine (FIORICET) 50-325-40 MG per tablet Take 1 tablet by mouth every 8 (eight) hours as needed.        . Calcium Carbonate-Vitamin D (CALCIUM 600-D) 600-400 MG-UNIT per tablet Take 1 tablet by mouth 2 (two) times daily with a meal.        . Cholecalciferol (VITAMIN D PO) Take 1 tablet by mouth daily.       . cyclobenzaprine (FLEXERIL) 10 MG tablet Take 1 tablet  by mouth 3 (three) times daily as needed.       . ferrous fumarate-iron polysaccharide complex (TANDEM) 162-115.2 MG CAPS Take 1 capsule by mouth daily.        . fluticasone (FLONASE) 50 MCG/ACT nasal spray USE 1 TO 2 SPRAYS IN EACH NOSTRIL DAILY  16 g  0  . fluticasone (VERAMYST) 27.5 MCG/SPRAY nasal spray Place 2 sprays into the nose daily.  10 g  0  . gabapentin (NEURONTIN) 100 MG capsule Take 100 mg by mouth 2 (two) times daily.      [name] HYDROmorphone HCl (DILAUDID PO) Take by mouth. 2-3 times daily       . Lactulose SOLN Take 2 mLs by mouth every evening.  500 mL  2  . nebivolol (BYSTOLIC) 5 MG tablet Take 1/2 tablet by mouth daily.  30 tablet  2  . nitroGLYCERIN (NITROSTAT) 0.4 MG SL tablet Place 1 tablet (0.4 mg total) under the tongue every 5 (five) minutes as needed for chest pain.  100 tablet  3  . NON FORMULARY OXYGEN. 2L at night       . omeprazole (PRILOSEC) 40 MG capsule Take 20 mg by mouth daily.       . promethazine (PHENERGAN) 25 MG tablet Take by mouth as needed.      [name] Respiratory Therapy Supplies (FLUTTER)  DEVI Use 3-4 times daily.      [name] Spacer/Aero-Holding Chambers (AEROCHAMBER MV) inhaler Use with Symbicort.       . tiotropium (SPIRIVA) 18 MCG inhalation capsule Place 18 mcg into inhaler and inhale daily.            Review of Systems  Constitutional:   No  weight loss, night sweats,  Fevers, chills,  +fatigue, lassitude. HEENT:   No headaches,  Difficulty swallowing,  Tooth/dental problems,  Sore throat,                No sneezing, itching, ear ache, nasal congestion, post nasal drip,   CV:  No chest pain,  Orthopnea, PND, swelling in lower extremities, anasarca, dizziness, palpitations  GI  No heartburn, indigestion, abdominal pain, nausea, vomiting, diarrhea, change in bowel habits, loss of appetite  Resp: Notes shortness of breath with exertion and at rest.    No coughing up of blood.  No change in color of mucus.  No wheezing.  No chest wall deformity  Skin: no rash or lesions.  GU: no dysuria, change in color of urine, no urgency or frequency.  No flank pain.  MS:  No joint pain or swelling.  No decreased range of motion.  No back pain.  Psych:  No change in mood or affect. No depression or anxiety.  No memory loss.     Objective:   Physical Exam  Filed Vitals:   05/09/11 1604  BP: 100/52  Pulse: 77  Temp: 98.2 F (36.8 C)  TempSrc: Oral  Height: 5\' 1"  (1.549 m)  Weight: 126 lb 9.6 oz (57.425 kg)  SpO2: 90%    Gen: Pleasant, well-nourished, in no distress,  normal affect  ENT: No lesions,  mouth clear,  oropharynx clear, no postnasal drip  Neck: No JVD, no TMG, no carotid bruits  Lungs: No use of accessory muscles, no dullness to percussion,no wheeze, coarse BS   Cardiovascular: RRR, heart sounds normal, no murmur or gallops, no peripheral edema  Abdomen: soft and NT, no HSM,  BS normal  Musculoskeletal: No deformities, no cyanosis or clubbing  Neuro: alert,  non focal  Skin: Warm, no lesions or rashes        Assessment & Plan:   No  problem-specific assessment & plan notes found for this encounter.   Updated Medication List Outpatient Encounter Prescriptions as of 05/09/2011  Medication Sig Dispense Refill  . albuterol (VENTOLIN HFA) 108 (90 BASE) MCG/ACT inhaler Inhale 1 puff into the lungs 3 (three) times daily as needed.        . Alum & Mag Hydroxide-Simeth (MAGIC MOUTHWASH) SOLN Take 5 mLs by mouth 3 (three) times daily.  75 mL  1  . Ascorbic Acid (VITAMIN C) 500 MG tablet Take 500 mg by mouth daily.        [name] aspirin 81 MG tablet Take 81 mg by mouth daily.        . budesonide-formoterol (SYMBICORT) 160-4.5 MCG/ACT inhaler Inhale 2 puffs into the lungs 2 (two) times daily.        . butalbital-acetaminophen-caffeine (FIORICET) 50-325-40 MG per tablet Take 1 tablet by mouth every 8 (eight) hours as needed.        . Calcium Carbonate-Vitamin D (CALCIUM 600-D) 600-400 MG-UNIT per tablet Take 1 tablet by mouth 2 (two) times daily with a meal.        . Cholecalciferol (VITAMIN D PO) Take 1 tablet by mouth daily.       . cyclobenzaprine (FLEXERIL) 10 MG tablet Take 1 tablet by mouth 3 (three) times daily as needed.       . ferrous fumarate-iron polysaccharide complex (TANDEM) 162-115.2 MG CAPS Take 1 capsule by mouth daily.        . fluticasone (FLONASE) 50 MCG/ACT nasal spray USE 1 TO 2 SPRAYS IN EACH NOSTRIL DAILY  16 g  0  . fluticasone (VERAMYST) 27.5 MCG/SPRAY nasal spray Place 2 sprays into the nose daily.  10 g  0  . gabapentin (NEURONTIN) 100 MG capsule Take 100 mg by mouth 2 (two) times daily.      [name] HYDROmorphone HCl (DILAUDID PO) Take by mouth. 2-3 times daily       . Lactulose SOLN Take 2 mLs by mouth every evening.  500 mL  2  . nebivolol (BYSTOLIC) 5 MG tablet Take 1/2 tablet by mouth daily.  30 tablet  2  . nitroGLYCERIN (NITROSTAT) 0.4 MG SL tablet Place 1 tablet (0.4 mg total) under the tongue every 5 (five) minutes as needed for chest pain.  100 tablet  3  . NON FORMULARY OXYGEN. 2L at night       .  omeprazole (PRILOSEC) 40 MG capsule Take 20 mg by mouth daily.       . promethazine (PHENERGAN) 25 MG tablet Take by mouth as needed.      [name] Respiratory Therapy Supplies (FLUTTER) DEVI Use 3-4 times daily.      [name] Spacer/Aero-Holding Chambers (AEROCHAMBER MV) inhaler Use with Symbicort.       . tiotropium (SPIRIVA) 18 MCG inhalation capsule Place 18 mcg into inhaler and inhale daily.

## 2011-05-09 NOTE — Patient Instructions (Signed)
Continue on current regimen .  follow up Dr. Wright  In 3 months and As needed   

## 2011-05-15 DIAGNOSIS — K2100 Gastro-esophageal reflux disease with esophagitis, without bleeding: Secondary | ICD-10-CM | POA: Diagnosis not present

## 2011-05-15 DIAGNOSIS — K5900 Constipation, unspecified: Secondary | ICD-10-CM | POA: Diagnosis not present

## 2011-05-15 DIAGNOSIS — R109 Unspecified abdominal pain: Secondary | ICD-10-CM | POA: Diagnosis not present

## 2011-05-15 NOTE — Assessment & Plan Note (Signed)
Compensated on present regimen  Plan:  Continue on current regimen  follow up Dr. Delford Field  In 3 months and As needed

## 2011-06-03 DIAGNOSIS — M545 Low back pain, unspecified: Secondary | ICD-10-CM | POA: Diagnosis not present

## 2011-06-03 DIAGNOSIS — M5137 Other intervertebral disc degeneration, lumbosacral region: Secondary | ICD-10-CM | POA: Diagnosis not present

## 2011-06-03 DIAGNOSIS — G894 Chronic pain syndrome: Secondary | ICD-10-CM | POA: Diagnosis not present

## 2011-06-03 DIAGNOSIS — Z462 Encounter for fitting and adjustment of other devices related to nervous system and special senses: Secondary | ICD-10-CM | POA: Diagnosis not present

## 2011-07-02 DIAGNOSIS — G8929 Other chronic pain: Secondary | ICD-10-CM | POA: Diagnosis not present

## 2011-07-02 DIAGNOSIS — M545 Low back pain, unspecified: Secondary | ICD-10-CM | POA: Diagnosis not present

## 2011-07-02 DIAGNOSIS — J449 Chronic obstructive pulmonary disease, unspecified: Secondary | ICD-10-CM | POA: Diagnosis not present

## 2011-07-02 DIAGNOSIS — M069 Rheumatoid arthritis, unspecified: Secondary | ICD-10-CM | POA: Diagnosis not present

## 2011-07-02 DIAGNOSIS — IMO0001 Reserved for inherently not codable concepts without codable children: Secondary | ICD-10-CM | POA: Diagnosis not present

## 2011-07-02 DIAGNOSIS — Z885 Allergy status to narcotic agent status: Secondary | ICD-10-CM | POA: Diagnosis not present

## 2011-07-02 DIAGNOSIS — Z79899 Other long term (current) drug therapy: Secondary | ICD-10-CM | POA: Diagnosis not present

## 2011-07-02 DIAGNOSIS — M4120 Other idiopathic scoliosis, site unspecified: Secondary | ICD-10-CM | POA: Diagnosis not present

## 2011-07-02 DIAGNOSIS — I10 Essential (primary) hypertension: Secondary | ICD-10-CM | POA: Diagnosis not present

## 2011-07-02 DIAGNOSIS — Z7982 Long term (current) use of aspirin: Secondary | ICD-10-CM | POA: Diagnosis not present

## 2011-07-02 DIAGNOSIS — M5137 Other intervertebral disc degeneration, lumbosacral region: Secondary | ICD-10-CM | POA: Diagnosis not present

## 2011-07-02 DIAGNOSIS — Z462 Encounter for fitting and adjustment of other devices related to nervous system and special senses: Secondary | ICD-10-CM | POA: Diagnosis not present

## 2011-07-03 DIAGNOSIS — G8929 Other chronic pain: Secondary | ICD-10-CM | POA: Diagnosis not present

## 2011-07-03 DIAGNOSIS — M5137 Other intervertebral disc degeneration, lumbosacral region: Secondary | ICD-10-CM | POA: Diagnosis not present

## 2011-07-03 DIAGNOSIS — Z462 Encounter for fitting and adjustment of other devices related to nervous system and special senses: Secondary | ICD-10-CM | POA: Diagnosis not present

## 2011-07-03 DIAGNOSIS — M51379 Other intervertebral disc degeneration, lumbosacral region without mention of lumbar back pain or lower extremity pain: Secondary | ICD-10-CM | POA: Diagnosis not present

## 2011-07-03 DIAGNOSIS — G894 Chronic pain syndrome: Secondary | ICD-10-CM | POA: Diagnosis not present

## 2011-07-03 DIAGNOSIS — M545 Low back pain, unspecified: Secondary | ICD-10-CM | POA: Diagnosis not present

## 2011-07-03 DIAGNOSIS — I10 Essential (primary) hypertension: Secondary | ICD-10-CM | POA: Diagnosis not present

## 2011-07-03 DIAGNOSIS — M4120 Other idiopathic scoliosis, site unspecified: Secondary | ICD-10-CM | POA: Diagnosis not present

## 2011-07-15 ENCOUNTER — Ambulatory Visit (INDEPENDENT_AMBULATORY_CARE_PROVIDER_SITE_OTHER): Payer: Medicare Other | Admitting: Internal Medicine

## 2011-07-15 ENCOUNTER — Encounter: Payer: Self-pay | Admitting: Internal Medicine

## 2011-07-15 ENCOUNTER — Telehealth: Payer: Self-pay | Admitting: Internal Medicine

## 2011-07-15 VITALS — BP 100/60 | HR 65 | Temp 98.2°F | Resp 20 | Ht 61.0 in | Wt 121.0 lb

## 2011-07-15 DIAGNOSIS — M1990 Unspecified osteoarthritis, unspecified site: Secondary | ICD-10-CM

## 2011-07-15 DIAGNOSIS — M810 Age-related osteoporosis without current pathological fracture: Secondary | ICD-10-CM

## 2011-07-15 DIAGNOSIS — I10 Essential (primary) hypertension: Secondary | ICD-10-CM

## 2011-07-15 DIAGNOSIS — Z79899 Other long term (current) drug therapy: Secondary | ICD-10-CM

## 2011-07-15 MED ORDER — DICLOFENAC SODIUM 1 % TD GEL: 1.0000 "application " | Freq: Four times a day (QID) | TRANSDERMAL | Status: DC | PRN

## 2011-07-15 NOTE — Progress Notes (Signed)
  Subjective:    Patient ID: [name] [name], female    DOB: 1925/11/11, 78 y.o.   MRN: [id number]  HPI Pt presents to clinic for followup of multiple medical problems. Some intermittent DOE with weather changes but feels is mild. Arthritis of hands is flaring. Believes last BMD > 2 years ago.   Past Medical History  Diagnosis Date  . Osteoporosis   . Arthritis     osteoarthritis  . GERD (gastroesophageal reflux disease)   . Hiatal hernia   . COPD (chronic obstructive pulmonary disease)   . Pneumonia   . Hypotension   . PUD (peptic ulcer disease)   . Cardiac arrhythmia   . Hyperlipidemia   . Heart failure   . Hypertension   . Scoliosis deformity of spine     history of intractable back pain  . Choledocholithiasis   . Pancolitis     Infectious vs. inflammatory  . Abdominal pain     Resolved  . Tachycardia   . Chronic anemia    Past Surgical History  Procedure Date  . Cholecystectomy 2008  . Abdominal hysterectomy 1970s  . Spine surgery [id number]  . Neck surgery 1970s  . Spinal cord stimulator implant   . Ercp w/ sphicterotomy 02/2010    reports that she quit smoking about 23 years ago. Her smoking use included Cigarettes. She has a 12 pack-year smoking history. She has never used smokeless tobacco. She reports that she does not drink alcohol. Her drug history not on file. family history includes Cancer in her mother; Heart disease in her father; and Hypertension in her other. Allergies  Allergen Reactions  . Codeine     REACTION: n/v/d, HA  . Morphine     REACTION: n/v/d, HA  . Zoledronic Acid     REACTION: Severe edema      Review of Systems see hpi     Objective:   Physical Exam  Physical Exam  Nursing note and vitals reviewed. Constitutional: Appears well-developed and well-nourished. No distress.  HENT:  Head: Normocephalic and atraumatic.  Right Ear: External ear normal.  Left Ear: External ear normal.  Eyes: Conjunctivae are normal. No  scleral icterus.  Neck: Neck supple. Carotid bruit is not present.  Cardiovascular: Normal rate, regular rhythm and normal heart sounds.  Exam reveals no gallop and no friction rub.   No murmur heard. Pulmonary/Chest: Effort normal and breath sounds normal. No respiratory distress. He has no wheezes. no rales.  Lymphadenopathy:    He has no cervical adenopathy.  Neurological:Alert.  Skin: Skin is warm and dry. Not diaphoretic.  Psychiatric: Has a normal mood and affect.        Assessment & Plan:

## 2011-07-15 NOTE — Assessment & Plan Note (Signed)
Low nl. asx without dizziness/preysncope. Continue current regimen and monitor bp

## 2011-07-15 NOTE — Assessment & Plan Note (Signed)
Attempt voltaren gel prn.

## 2011-07-15 NOTE — Assessment & Plan Note (Signed)
Schedule bmd 

## 2011-07-15 NOTE — Telephone Encounter (Signed)
Lab order entered.

## 2011-07-15 NOTE — Patient Instructions (Signed)
We will help you schedule your bone density test. Please schedule labs prior to your next appointment- [phone]

## 2011-07-18 ENCOUNTER — Telehealth: Payer: Self-pay | Admitting: Internal Medicine

## 2011-07-18 MED ORDER — OMEPRAZOLE 20 MG PO CPDR: 40.0000 mg | DELAYED_RELEASE_CAPSULE | Freq: Every day | ORAL | Status: DC

## 2011-07-18 NOTE — Telephone Encounter (Signed)
Rx refill sent to pharmacy. 

## 2011-07-18 NOTE — Telephone Encounter (Signed)
Refill- omeprazole dr 20mg  cer. Take one capsule by mouth two times daily. Qty 60 last fill 6.1.13

## 2011-07-25 DIAGNOSIS — K5900 Constipation, unspecified: Secondary | ICD-10-CM | POA: Diagnosis not present

## 2011-07-25 DIAGNOSIS — R109 Unspecified abdominal pain: Secondary | ICD-10-CM | POA: Diagnosis not present

## 2011-07-25 DIAGNOSIS — K2100 Gastro-esophageal reflux disease with esophagitis, without bleeding: Secondary | ICD-10-CM | POA: Diagnosis not present

## 2011-07-29 DIAGNOSIS — M545 Low back pain, unspecified: Secondary | ICD-10-CM | POA: Diagnosis not present

## 2011-07-29 DIAGNOSIS — M5137 Other intervertebral disc degeneration, lumbosacral region: Secondary | ICD-10-CM | POA: Diagnosis not present

## 2011-07-29 DIAGNOSIS — G894 Chronic pain syndrome: Secondary | ICD-10-CM | POA: Diagnosis not present

## 2011-07-29 DIAGNOSIS — Z462 Encounter for fitting and adjustment of other devices related to nervous system and special senses: Secondary | ICD-10-CM | POA: Diagnosis not present

## 2011-08-27 ENCOUNTER — Ambulatory Visit: Payer: Medicare Other | Admitting: Critical Care Medicine

## 2011-08-27 DIAGNOSIS — Z462 Encounter for fitting and adjustment of other devices related to nervous system and special senses: Secondary | ICD-10-CM | POA: Diagnosis not present

## 2011-08-27 DIAGNOSIS — M545 Low back pain, unspecified: Secondary | ICD-10-CM | POA: Diagnosis not present

## 2011-08-27 DIAGNOSIS — G894 Chronic pain syndrome: Secondary | ICD-10-CM | POA: Diagnosis not present

## 2011-08-27 DIAGNOSIS — M5137 Other intervertebral disc degeneration, lumbosacral region: Secondary | ICD-10-CM | POA: Diagnosis not present

## 2011-09-02 ENCOUNTER — Ambulatory Visit (INDEPENDENT_AMBULATORY_CARE_PROVIDER_SITE_OTHER): Payer: Medicare Other | Admitting: Internal Medicine

## 2011-09-02 ENCOUNTER — Encounter: Payer: Self-pay | Admitting: Internal Medicine

## 2011-09-02 VITALS — BP 110/63 | HR 64 | Temp 98.1°F | Resp 16 | Wt 122.0 lb

## 2011-09-02 DIAGNOSIS — J40 Bronchitis, not specified as acute or chronic: Secondary | ICD-10-CM | POA: Diagnosis not present

## 2011-09-02 MED ORDER — DOXYCYCLINE HYCLATE 100 MG PO TABS: 100.0000 mg | ORAL_TABLET | Freq: Two times a day (BID) | ORAL | Status: AC

## 2011-09-02 NOTE — Progress Notes (Signed)
  Subjective:    Patient ID: [name] [name], female    DOB: 1925/06/02, 78 y.o.   MRN: [id number]  HPI Pt presents to clinic for evaluation of cough. Notes one week h/o chest congestion, cough productive for grey sputum and nasal drainage. Denies f/c, dyspnea or wheezing. Taking no medication for the problem. No alleviating or exacerbating factors. Known h/o COPD compliant with medications.  Past Medical History  Diagnosis Date  . Osteoporosis   . Arthritis     osteoarthritis  . GERD (gastroesophageal reflux disease)   . Hiatal hernia   . COPD (chronic obstructive pulmonary disease)   . Pneumonia   . Hypotension   . PUD (peptic ulcer disease)   . Cardiac arrhythmia   . Hyperlipidemia   . Heart failure   . Hypertension   . Scoliosis deformity of spine     history of intractable back pain  . Choledocholithiasis   . Pancolitis     Infectious vs. inflammatory  . Abdominal pain     Resolved  . Tachycardia   . Chronic anemia    Past Surgical History  Procedure Date  . Cholecystectomy 2008  . Abdominal hysterectomy 1970s  . Spine surgery [phone]  . Neck surgery 1970s  . Spinal cord stimulator implant   . Ercp w/ sphicterotomy 02/2010    reports that she quit smoking about 23 years ago. Her smoking use included Cigarettes. She has a 12 pack-year smoking history. She has never used smokeless tobacco. She reports that she does not drink alcohol. Her drug history not on file. family history includes Cancer in her mother; Heart disease in her father; and Hypertension in her other. Allergies  Allergen Reactions  . Codeine     REACTION: n/v/d, HA  . Morphine     REACTION: n/v/d, HA  . Zoledronic Acid     REACTION: Severe edema     Review of Systems see hpi     Objective:   Physical Exam  Nursing note and vitals reviewed. Constitutional: She appears well-developed and well-nourished. No distress.  HENT:  Head: Normocephalic and atraumatic.  Eyes: Conjunctivae are  normal. No scleral icterus.  Neck: Neck supple. No JVD present.  Cardiovascular: Normal rate, regular rhythm and normal heart sounds.  Exam reveals no gallop and no friction rub.   No murmur heard. Pulmonary/Chest: Effort normal and breath sounds normal. No respiratory distress. She has no wheezes. She has no rales.  Neurological: She is alert.  Skin: Skin is warm and dry. She is not diaphoretic.  Psychiatric: She has a normal mood and affect.          Assessment & Plan:

## 2011-09-03 ENCOUNTER — Telehealth: Payer: Self-pay | Admitting: Internal Medicine

## 2011-09-03 DIAGNOSIS — J40 Bronchitis, not specified as acute or chronic: Secondary | ICD-10-CM | POA: Insufficient documentation

## 2011-09-03 NOTE — Telephone Encounter (Signed)
Patients daughter Eunice Blase states that patient is having a side effect to Doxyclycline. She has been throwing up ever since starting med.

## 2011-09-03 NOTE — Assessment & Plan Note (Signed)
Begin abx tx. Followup if no improvement or worsening.  

## 2011-09-03 NOTE — Telephone Encounter (Signed)
Patients daughter called again and states that if Dr. Rodena Medin calls any other medication in to send medication to [facility]

## 2011-09-03 NOTE — Telephone Encounter (Signed)
Change to zpak as directed #1 if not allergic and no interactions

## 2011-09-04 ENCOUNTER — Other Ambulatory Visit: Payer: Self-pay | Admitting: Internal Medicine

## 2011-09-04 MED ORDER — AZITHROMYCIN 250 MG PO TABS: ORAL_TABLET | ORAL | Status: AC

## 2011-09-04 NOTE — Telephone Encounter (Signed)
done

## 2011-09-04 NOTE — Telephone Encounter (Signed)
Please call the z pack in to [facility] so grand daughter can pick up for her as soon as possible.  [name] [name] is not doing well on the doxycycline

## 2011-09-25 DIAGNOSIS — M5137 Other intervertebral disc degeneration, lumbosacral region: Secondary | ICD-10-CM | POA: Diagnosis not present

## 2011-09-25 DIAGNOSIS — Z462 Encounter for fitting and adjustment of other devices related to nervous system and special senses: Secondary | ICD-10-CM | POA: Diagnosis not present

## 2011-09-25 DIAGNOSIS — M545 Low back pain, unspecified: Secondary | ICD-10-CM | POA: Diagnosis not present

## 2011-09-25 DIAGNOSIS — G894 Chronic pain syndrome: Secondary | ICD-10-CM | POA: Diagnosis not present

## 2011-10-12 ENCOUNTER — Encounter (HOSPITAL_COMMUNITY): Payer: Self-pay

## 2011-10-12 ENCOUNTER — Emergency Department (HOSPITAL_COMMUNITY): Payer: Medicare Other

## 2011-10-12 ENCOUNTER — Inpatient Hospital Stay (HOSPITAL_COMMUNITY)
Admission: EM | Admit: 2011-10-12 | Discharge: 2011-10-20 | DRG: 871 | Disposition: A | Discharge status: Home Health | Payer: Medicare Other | Attending: Internal Medicine | Admitting: Internal Medicine

## 2011-10-12 DIAGNOSIS — D72829 Elevated white blood cell count, unspecified: Secondary | ICD-10-CM | POA: Diagnosis present

## 2011-10-12 DIAGNOSIS — R519 Headache, unspecified: Secondary | ICD-10-CM | POA: Diagnosis present

## 2011-10-12 DIAGNOSIS — R9439 Abnormal result of other cardiovascular function study: Secondary | ICD-10-CM | POA: Diagnosis present

## 2011-10-12 DIAGNOSIS — R109 Unspecified abdominal pain: Secondary | ICD-10-CM | POA: Diagnosis not present

## 2011-10-12 DIAGNOSIS — E7800 Pure hypercholesterolemia, unspecified: Secondary | ICD-10-CM

## 2011-10-12 DIAGNOSIS — M1990 Unspecified osteoarthritis, unspecified site: Secondary | ICD-10-CM

## 2011-10-12 DIAGNOSIS — I10 Essential (primary) hypertension: Secondary | ICD-10-CM | POA: Diagnosis present

## 2011-10-12 DIAGNOSIS — M542 Cervicalgia: Secondary | ICD-10-CM

## 2011-10-12 DIAGNOSIS — M810 Age-related osteoporosis without current pathological fracture: Secondary | ICD-10-CM

## 2011-10-12 DIAGNOSIS — R6510 Systemic inflammatory response syndrome (SIRS) of non-infectious origin without acute organ dysfunction: Secondary | ICD-10-CM

## 2011-10-12 DIAGNOSIS — A419 Sepsis, unspecified organism: Secondary | ICD-10-CM | POA: Diagnosis not present

## 2011-10-12 DIAGNOSIS — Z9981 Dependence on supplemental oxygen: Secondary | ICD-10-CM | POA: Diagnosis not present

## 2011-10-12 DIAGNOSIS — G8929 Other chronic pain: Secondary | ICD-10-CM | POA: Diagnosis present

## 2011-10-12 DIAGNOSIS — K5289 Other specified noninfective gastroenteritis and colitis: Secondary | ICD-10-CM | POA: Diagnosis not present

## 2011-10-12 DIAGNOSIS — J40 Bronchitis, not specified as acute or chronic: Secondary | ICD-10-CM

## 2011-10-12 DIAGNOSIS — K5100 Ulcerative (chronic) pancolitis without complications: Secondary | ICD-10-CM | POA: Diagnosis not present

## 2011-10-12 DIAGNOSIS — R5381 Other malaise: Secondary | ICD-10-CM | POA: Diagnosis not present

## 2011-10-12 DIAGNOSIS — H9190 Unspecified hearing loss, unspecified ear: Secondary | ICD-10-CM

## 2011-10-12 DIAGNOSIS — J4489 Other specified chronic obstructive pulmonary disease: Secondary | ICD-10-CM | POA: Diagnosis present

## 2011-10-12 DIAGNOSIS — R51 Headache: Secondary | ICD-10-CM | POA: Diagnosis present

## 2011-10-12 DIAGNOSIS — R933 Abnormal findings on diagnostic imaging of other parts of digestive tract: Secondary | ICD-10-CM | POA: Diagnosis not present

## 2011-10-12 DIAGNOSIS — R112 Nausea with vomiting, unspecified: Secondary | ICD-10-CM | POA: Diagnosis not present

## 2011-10-12 DIAGNOSIS — I509 Heart failure, unspecified: Secondary | ICD-10-CM | POA: Diagnosis not present

## 2011-10-12 DIAGNOSIS — I279 Pulmonary heart disease, unspecified: Secondary | ICD-10-CM | POA: Diagnosis present

## 2011-10-12 DIAGNOSIS — K2970 Gastritis, unspecified, without bleeding: Secondary | ICD-10-CM | POA: Diagnosis not present

## 2011-10-12 DIAGNOSIS — K319 Disease of stomach and duodenum, unspecified: Secondary | ICD-10-CM | POA: Diagnosis not present

## 2011-10-12 DIAGNOSIS — R918 Other nonspecific abnormal finding of lung field: Secondary | ICD-10-CM | POA: Diagnosis not present

## 2011-10-12 DIAGNOSIS — K8689 Other specified diseases of pancreas: Secondary | ICD-10-CM

## 2011-10-12 DIAGNOSIS — K859 Acute pancreatitis without necrosis or infection, unspecified: Secondary | ICD-10-CM

## 2011-10-12 DIAGNOSIS — D649 Anemia, unspecified: Secondary | ICD-10-CM

## 2011-10-12 DIAGNOSIS — R197 Diarrhea, unspecified: Secondary | ICD-10-CM | POA: Diagnosis not present

## 2011-10-12 DIAGNOSIS — J449 Chronic obstructive pulmonary disease, unspecified: Secondary | ICD-10-CM | POA: Diagnosis not present

## 2011-10-12 DIAGNOSIS — K449 Diaphragmatic hernia without obstruction or gangrene: Secondary | ICD-10-CM

## 2011-10-12 DIAGNOSIS — R748 Abnormal levels of other serum enzymes: Secondary | ICD-10-CM

## 2011-10-12 DIAGNOSIS — K279 Peptic ulcer, site unspecified, unspecified as acute or chronic, without hemorrhage or perforation: Secondary | ICD-10-CM

## 2011-10-12 DIAGNOSIS — J439 Emphysema, unspecified: Secondary | ICD-10-CM | POA: Diagnosis present

## 2011-10-12 DIAGNOSIS — K219 Gastro-esophageal reflux disease without esophagitis: Secondary | ICD-10-CM | POA: Diagnosis present

## 2011-10-12 DIAGNOSIS — J690 Pneumonitis due to inhalation of food and vomit: Secondary | ICD-10-CM | POA: Diagnosis not present

## 2011-10-12 DIAGNOSIS — R0789 Other chest pain: Secondary | ICD-10-CM | POA: Diagnosis not present

## 2011-10-12 DIAGNOSIS — R1013 Epigastric pain: Secondary | ICD-10-CM | POA: Diagnosis not present

## 2011-10-12 DIAGNOSIS — R892 Abnormal level of other drugs, medicaments and biological substances in specimens from other organs, systems and tissues: Secondary | ICD-10-CM

## 2011-10-12 DIAGNOSIS — I499 Cardiac arrhythmia, unspecified: Secondary | ICD-10-CM

## 2011-10-12 HISTORY — DX: Dorsalgia, unspecified: M54.9

## 2011-10-12 HISTORY — DX: Heart failure, unspecified: I50.9

## 2011-10-12 HISTORY — DX: Acute pancreatitis without necrosis or infection, unspecified: K85.90

## 2011-10-12 HISTORY — DX: Other chronic pain: G89.29

## 2011-10-12 LAB — CBC WITH DIFFERENTIAL/PLATELET
Basophils Absolute: 0 10*3/uL (ref 0.0–0.1)
Basophils Relative: 0 % (ref 0–1)
Eosinophils Absolute: 0 10*3/uL (ref 0.0–0.7)
Eosinophils Relative: 0 % (ref 0–5)
HCT: 44.5 % (ref 36.0–46.0)
Hemoglobin: 14.7 g/dL (ref 12.0–15.0)
Lymphocytes Relative: 2 % — ABNORMAL LOW (ref 12–46)
Lymphs Abs: 0.4 10*3/uL — ABNORMAL LOW (ref 0.7–4.0)
MCH: 30.6 pg (ref 26.0–34.0)
MCHC: 33 g/dL (ref 30.0–36.0)
MCV: 92.7 fL (ref 78.0–100.0)
Monocytes Absolute: 1.1 10*3/uL — ABNORMAL HIGH (ref 0.1–1.0)
Monocytes Relative: 5 % (ref 3–12)
Neutro Abs: 22.1 10*3/uL — ABNORMAL HIGH (ref 1.7–7.7)
Neutrophils Relative %: 94 % — ABNORMAL HIGH (ref 43–77)
Platelets: 185 10*3/uL (ref 150–400)
RBC: 4.8 MIL/uL (ref 3.87–5.11)
RDW: 13.4 % (ref 11.5–15.5)
WBC: 23.7 10*3/uL — ABNORMAL HIGH (ref 4.0–10.5)

## 2011-10-12 LAB — URINALYSIS, ROUTINE W REFLEX MICROSCOPIC
Bilirubin Urine: NEGATIVE
Glucose, UA: NEGATIVE mg/dL
Hgb urine dipstick: NEGATIVE
Ketones, ur: 40 mg/dL — AB
Leukocytes, UA: NEGATIVE
Nitrite: NEGATIVE
Protein, ur: 30 mg/dL — AB
Specific Gravity, Urine: 1.02 (ref 1.005–1.030)
Urobilinogen, UA: 0.2 mg/dL (ref 0.0–1.0)
pH: 6 (ref 5.0–8.0)

## 2011-10-12 LAB — COMPREHENSIVE METABOLIC PANEL
ALT: 12 U/L (ref 0–35)
AST: 22 U/L (ref 0–37)
Albumin: 3.7 g/dL (ref 3.5–5.2)
Alkaline Phosphatase: 137 U/L — ABNORMAL HIGH (ref 39–117)
BUN: 14 mg/dL (ref 6–23)
CO2: 30 mEq/L (ref 19–32)
Calcium: 9.6 mg/dL (ref 8.4–10.5)
Chloride: 95 mEq/L — ABNORMAL LOW (ref 96–112)
Creatinine, Ser: 0.69 mg/dL (ref 0.50–1.10)
GFR calc Af Amer: >90 mL/min (ref >90)
GFR calc non Af Amer: 78 mL/min — ABNORMAL LOW (ref >90)
Glucose, Bld: 195 mg/dL — ABNORMAL HIGH (ref 70–99)
Potassium: 3.4 mEq/L — ABNORMAL LOW (ref 3.5–5.1)
Sodium: 137 mEq/L (ref 135–145)
Total Bilirubin: 0.3 mg/dL (ref 0.3–1.2)
Total Protein: 6.9 g/dL (ref 6.0–8.3)

## 2011-10-12 LAB — OCCULT BLOOD, POC DEVICE: Fecal Occult Bld: POSITIVE

## 2011-10-12 LAB — URINE MICROSCOPIC-ADD ON

## 2011-10-12 LAB — LACTIC ACID, PLASMA: Lactic Acid, Venous: 2.1 mmol/L (ref 0.5–2.2)

## 2011-10-12 MED ORDER — HYDROMORPHONE HCL PF 1 MG/ML IJ SOLN
0.5000 mg | Freq: Once | INTRAMUSCULAR | Status: AC
  Administered 2011-10-13: 0.5 mg via INTRAVENOUS
  Filled 2011-10-12: qty 1

## 2011-10-12 MED ORDER — SODIUM CHLORIDE 0.9 % IV BOLUS (SEPSIS)
1000.0000 mL | Freq: Once | INTRAVENOUS | Status: AC
  Administered 2011-10-12: 1000 mL via INTRAVENOUS

## 2011-10-12 MED ORDER — METRONIDAZOLE IN NACL 5-0.79 MG/ML-% IV SOLN
500.0000 mg | Freq: Once | INTRAVENOUS | Status: AC
  Administered 2011-10-13: 500 mg via INTRAVENOUS
  Filled 2011-10-12: qty 100

## 2011-10-12 MED ORDER — ONDANSETRON HCL 4 MG/2ML IJ SOLN
4.0000 mg | Freq: Once | INTRAMUSCULAR | Status: AC
  Administered 2011-10-12: 4 mg via INTRAVENOUS

## 2011-10-12 MED ORDER — ONDANSETRON HCL 4 MG/2ML IJ SOLN
INTRAMUSCULAR | Status: AC
  Filled 2011-10-12: qty 2

## 2011-10-12 MED ORDER — FENTANYL CITRATE 0.05 MG/ML IJ SOLN: 50.0000 ug | Freq: Once | INTRAMUSCULAR | Status: DC

## 2011-10-12 MED ORDER — ONDANSETRON HCL 4 MG/2ML IJ SOLN
4.0000 mg | Freq: Once | INTRAMUSCULAR | Status: AC
  Administered 2011-10-12: 4 mg via INTRAVENOUS
  Filled 2011-10-12: qty 2

## 2011-10-12 MED ORDER — POTASSIUM CHLORIDE 10 MEQ/100ML IV SOLN
10.0000 meq | Freq: Once | INTRAVENOUS | Status: AC
  Administered 2011-10-13: 10 meq via INTRAVENOUS
  Filled 2011-10-12: qty 100

## 2011-10-12 MED ORDER — HYDROMORPHONE HCL PF 1 MG/ML IJ SOLN
1.0000 mg | Freq: Once | INTRAMUSCULAR | Status: AC
  Administered 2011-10-12: 1 mg via INTRAVENOUS
  Filled 2011-10-12: qty 1

## 2011-10-12 MED ORDER — ONDANSETRON HCL 4 MG/2ML IJ SOLN: 4.0000 mg | Freq: Once | INTRAMUSCULAR | Status: AC

## 2011-10-12 MED ORDER — CIPROFLOXACIN IN D5W 400 MG/200ML IV SOLN
400.0000 mg | Freq: Once | INTRAVENOUS | Status: AC
  Administered 2011-10-13: 400 mg via INTRAVENOUS
  Filled 2011-10-12 (×2): qty 200

## 2011-10-12 MED ORDER — SODIUM CHLORIDE 0.9 % IV SOLN: INTRAVENOUS | Status: DC

## 2011-10-12 MED ORDER — IOHEXOL 300 MG/ML  SOLN
100.0000 mL | Freq: Once | INTRAMUSCULAR | Status: AC | PRN
  Administered 2011-10-12: 100 mL via INTRAVENOUS

## 2011-10-12 MED ORDER — FENTANYL CITRATE 0.05 MG/ML IJ SOLN
50.0000 ug | Freq: Once | INTRAMUSCULAR | Status: AC
  Administered 2011-10-12: 50 ug via INTRAVENOUS
  Filled 2011-10-12: qty 2

## 2011-10-12 NOTE — ED Provider Notes (Signed)
History     CSN: [id number]  Arrival date & time 10/12/11  1719   First MD Initiated Contact with Patient 10/12/11 1803      Chief Complaint  Patient presents with  . Abdominal Pain  . Nausea  . Emesis  . Headache    (Consider location/radiation/quality/duration/timing/severity/associated sxs/prior treatment) HPI Social frontal headache abdominal pain vomiting and diarrhea onset today. Patient had black stool earlier today. ho no other complaint no treatment prior to coming here. Denies abdominal pain presently. Complains of frontal headache presently. No nausea at present Past Medical History  Diagnosis Date  . Osteoporosis   . Arthritis     osteoarthritis  . GERD (gastroesophageal reflux disease)   . Hiatal hernia   . COPD (chronic obstructive pulmonary disease)   . Pneumonia   . Hypotension   . PUD (peptic ulcer disease)   . Cardiac arrhythmia   . Hyperlipidemia   . Heart failure   . Hypertension   . Scoliosis deformity of spine     history of intractable back pain  . Choledocholithiasis   . Pancolitis     Infectious vs. inflammatory  . Abdominal pain     Resolved  . Tachycardia   . Chronic anemia   . Fecal impaction    Chronic pain Past Surgical History  Procedure Date  . Cholecystectomy 2008  . Abdominal hysterectomy 1970s  . Spine surgery [phone]  . Neck surgery 1970s  . Spinal cord stimulator implant   . Ercp w/ sphicterotomy 02/2010    Family History  Problem Relation Age of Onset  . Cancer Mother     uterine  . Heart disease Father   . Hypertension Other     History  Substance Use Topics  . Smoking status: Former Smoker -- 1.0 packs/day for 12 years    Types: Cigarettes    Quit date: 01/28/1988  . Smokeless tobacco: Never Used  . Alcohol Use: No    OB History    Grav Para Term Preterm Abortions TAB SAB Ect Mult Living                  Review of Systems  Gastrointestinal: Positive for nausea, vomiting, abdominal pain,  diarrhea and blood in stool.  Neurological: Positive for headaches.  All other systems reviewed and are negative.    Allergies  Codeine; Morphine; and Zoledronic acid  Home Medications   Current Outpatient Rx  Name Route Sig Dispense Refill  . HYDROMORPHONE HCL 2 MG PO TABS Oral Take 2 mg by mouth as needed. pain    . HYOSCYAMINE SULFATE 0.125 MG PO TBDP Sublingual Place 0.125 mg under the tongue daily.    [name] VITAMIN D (ERGOCALCIFEROL) 50000 UNITS PO CAPS Oral Take 50,000 Units by mouth every 7 (seven) days.    . ALBUTEROL SULFATE HFA 108 (90 BASE) MCG/ACT IN AERS Inhalation Inhale 1 puff into the lungs 3 (three) times daily as needed.      [name] VITAMIN C 500 MG PO TABS Oral Take 500 mg by mouth daily.      . ASPIRIN 81 MG PO TABS Oral Take 81 mg by mouth daily.      . BUDESONIDE-FORMOTEROL FUMARATE 160-4.5 MCG/ACT IN AERO Inhalation Inhale 2 puffs into the lungs 2 (two) times daily.      [name] BUTALBITAL-APAP-CAFFEINE 50-325-40 MG PO TABS Oral Take 1 tablet by mouth every 8 (eight) hours as needed.      [name] CALCIUM CARBONATE-VITAMIN D 600-400 MG-UNIT PO  TABS Oral Take 1 tablet by mouth 2 (two) times daily with a meal.      . CYCLOBENZAPRINE HCL 10 MG PO TABS Oral Take 1 tablet by mouth 3 (three) times daily as needed.     [name] DICLOFENAC SODIUM 1 % TD GEL Topical Apply 1 application topically 4 (four) times daily as needed. 100 g 4  . FERROUS FUM-IRON POLYSACCH 162-115.2 MG PO CAPS Oral Take 1 capsule by mouth daily.      [name] FLUTICASONE PROPIONATE 50 MCG/ACT NA SUSP  USE 1 TO 2 SPRAYS IN EACH NOSTRIL DAILY 16 g 0  . GABAPENTIN 100 MG PO CAPS Oral Take 100 mg by mouth 3 (three) times daily.     [name] LACTULOSE SOLN Oral Take 2 mLs by mouth every evening. 500 mL 2  . NEBIVOLOL HCL 5 MG PO TABS  Take 1/2 tablet by mouth daily. 30 tablet 2  . NITROGLYCERIN 0.4 MG SL SUBL Sublingual Place 1 tablet (0.4 mg total) under the tongue every 5 (five) minutes as needed for chest pain. 100 tablet 3  . NON FORMULARY   OXYGEN. 2L at night     . OMEPRAZOLE 20 MG PO CPDR Oral Take 2 capsules (40 mg total) by mouth daily. 60 capsule 6  . PROMETHAZINE HCL 25 MG PO TABS Oral Take by mouth as needed.    [name] TIOTROPIUM BROMIDE MONOHYDRATE 18 MCG IN CAPS Inhalation Place 18 mcg into inhaler and inhale daily.        BP 180/75  Pulse 99  Temp 98.8 F (37.1 C) (Oral)  Resp 16  SpO2 98%  Physical Exam  Nursing note and vitals reviewed. Constitutional: She is oriented to person, place, and time.       Ill appearing  HENT:  Head: Normocephalic and atraumatic.  Eyes: Conjunctivae normal are normal. Pupils are equal, round, and reactive to light.  Neck: Neck supple. No tracheal deviation present. No thyromegaly present.  Cardiovascular: Normal rate and regular rhythm.   No murmur heard. Pulmonary/Chest: Effort normal and breath sounds normal.  Abdominal: Soft. Bowel sounds are normal. She exhibits no distension. There is no tenderness.  Genitourinary: Guaiac positive stool.       Liquid black stool heme positive  Musculoskeletal: Normal range of motion. She exhibits no edema and no tenderness.  Neurological: She is alert and oriented to person, place, and time. Coordination normal.  Skin: Skin is warm and dry. No rash noted.  Psychiatric: She has a normal mood and affect.    ED Course  Procedures (including critical care time)  Date: 10/12/2011  Rate: 102  Rhythm: sinus tachycardia  QRS Axis: normal  Intervals: normal  ST/T Wave abnormalities: normal  Conduction Disutrbances:none  Narrative Interpretation:   Old EKG Reviewed: Tracing from 12/02/2009 normal sinus rhythm within normal limits interpreted by me  Labs Reviewed - No data to display No results found.  x-rays reviewed by me No diagnosis found.  11:40 PM patient states nausea is improved pain is improved but requesting medicine for headache. Additional dilaudid ordered MDM  Spoke with Dr.Kakrakandy Plan admit step down unit. IV fluids  antibiotics, pain control, blood cultures  Diagnoses #1 sepsis #2 colitis #3 GI bleed #4 hypokalemia #5 headache  CRITICAL CARE Performed by: Doug Sou   Total critical care time: 30 minute  Critical care time was exclusive of separately billable procedures and treating other patients.  Critical care was necessary to treat or prevent imminent or life-threatening deterioration.  Critical  care was time spent personally by me on the following activities: development of treatment plan with patient and/or surrogate as well as nursing, discussions with consultants, evaluation of patient's response to treatment, examination of patient, obtaining history from patient or surrogate, ordering and performing treatments and interventions, ordering and review of laboratory studies, ordering and review of radiographic studies, pulse oximetry and re-evaluation of patient's condition.      Doug Sou, MD 10/12/11 2352

## 2011-10-12 NOTE — ED Notes (Signed)
XBM:[id number]<KG> Expected date:<BR> Expected time:<BR> Means of arrival:<BR> Comments:<BR> PTAR

## 2011-10-12 NOTE — ED Notes (Signed)
Patient's relative stated that she worked for [employer] and the patient had had diarrhea today and the patient's daughter contradicted her by saying that she saw the stool and it was formed and black. EDP at the bedside when this was said.

## 2011-10-12 NOTE — ED Notes (Signed)
Patient's relative very demanding. Relative requested a rectal temp and stated if we did not get one soon then she would do it.  Rectal temp taken by Clinical research associate and witnessed by Gerilyn Pilgrim, EMT. 99.6 rectally

## 2011-10-12 NOTE — ED Notes (Addendum)
Per [facility]: Approximately 0230, pt had a BM (dark stool, very solid); another BM after this. At approximately 1530, pt had an episode of diarrhea w/ c/o pain in the epigastric region. Nondistended. NV in presence of EMS staff. Alert/oriented, responsive to stimuli, but drowsy. O2 sat was 91% on RA, pt is on O2 2L at night. Pt responded to 3L O2 per nasal canula and o2 sat went up to 98%. Pt has not taken medicine today due to inability to keep down fluids and food. BP was 182/118 last. 20g in RAC, 4mg  Zofran IV.

## 2011-10-13 ENCOUNTER — Encounter (HOSPITAL_COMMUNITY): Payer: Self-pay | Admitting: Internal Medicine

## 2011-10-13 DIAGNOSIS — R51 Headache: Secondary | ICD-10-CM | POA: Diagnosis present

## 2011-10-13 DIAGNOSIS — R6510 Systemic inflammatory response syndrome (SIRS) of non-infectious origin without acute organ dysfunction: Secondary | ICD-10-CM

## 2011-10-13 DIAGNOSIS — I509 Heart failure, unspecified: Secondary | ICD-10-CM

## 2011-10-13 DIAGNOSIS — J449 Chronic obstructive pulmonary disease, unspecified: Secondary | ICD-10-CM

## 2011-10-13 DIAGNOSIS — R748 Abnormal levels of other serum enzymes: Secondary | ICD-10-CM | POA: Diagnosis present

## 2011-10-13 DIAGNOSIS — R519 Headache, unspecified: Secondary | ICD-10-CM | POA: Diagnosis present

## 2011-10-13 DIAGNOSIS — I279 Pulmonary heart disease, unspecified: Secondary | ICD-10-CM

## 2011-10-13 DIAGNOSIS — R6889 Other general symptoms and signs: Secondary | ICD-10-CM

## 2011-10-13 DIAGNOSIS — K5100 Ulcerative (chronic) pancolitis without complications: Secondary | ICD-10-CM

## 2011-10-13 LAB — CK TOTAL AND CKMB (NOT AT ARMC)
CK, MB: 4.6 ng/mL — ABNORMAL HIGH (ref 0.3–4.0)
CK, MB: 3.9 ng/mL (ref 0.3–4.0)
Relative Index: INVALID (ref 0.0–2.5)
Relative Index: INVALID (ref 0.0–2.5)
Total CK: 47 U/L (ref 7–177)
Total CK: 40 U/L (ref 7–177)

## 2011-10-13 LAB — CBC WITH DIFFERENTIAL/PLATELET
Basophils Absolute: 0 10*3/uL (ref 0.0–0.1)
Basophils Relative: 0 % (ref 0–1)
Eosinophils Absolute: 0 10*3/uL (ref 0.0–0.7)
Eosinophils Relative: 0 % (ref 0–5)
HCT: 44.2 % (ref 36.0–46.0)
Hemoglobin: 14.6 g/dL (ref 12.0–15.0)
Lymphocytes Relative: 5 % — ABNORMAL LOW (ref 12–46)
Lymphs Abs: 0.8 10*3/uL (ref 0.7–4.0)
MCH: 30.8 pg (ref 26.0–34.0)
MCHC: 33 g/dL (ref 30.0–36.0)
MCV: 93.2 fL (ref 78.0–100.0)
Monocytes Absolute: 0.8 10*3/uL (ref 0.1–1.0)
Monocytes Relative: 5 % (ref 3–12)
Neutro Abs: 13.9 10*3/uL — ABNORMAL HIGH (ref 1.7–7.7)
Neutrophils Relative %: 90 % — ABNORMAL HIGH (ref 43–77)
Platelets: 150 10*3/uL (ref 150–400)
RBC: 4.74 MIL/uL (ref 3.87–5.11)
RDW: 13.8 % (ref 11.5–15.5)
WBC: 15.5 10*3/uL — ABNORMAL HIGH (ref 4.0–10.5)

## 2011-10-13 LAB — COMPREHENSIVE METABOLIC PANEL
ALT: 9 U/L (ref 0–35)
AST: 17 U/L (ref 0–37)
Albumin: 2.7 g/dL — ABNORMAL LOW (ref 3.5–5.2)
Alkaline Phosphatase: 115 U/L (ref 39–117)
BUN: 16 mg/dL (ref 6–23)
CO2: 28 mEq/L (ref 19–32)
Calcium: 8 mg/dL — ABNORMAL LOW (ref 8.4–10.5)
Chloride: 98 mEq/L (ref 96–112)
Creatinine, Ser: 0.69 mg/dL (ref 0.50–1.10)
GFR calc Af Amer: >90 mL/min (ref >90)
GFR calc non Af Amer: 78 mL/min — ABNORMAL LOW (ref >90)
Glucose, Bld: 156 mg/dL — ABNORMAL HIGH (ref 70–99)
Potassium: 4 mEq/L (ref 3.5–5.1)
Sodium: 135 mEq/L (ref 135–145)
Total Bilirubin: 0.2 mg/dL — ABNORMAL LOW (ref 0.3–1.2)
Total Protein: 5.6 g/dL — ABNORMAL LOW (ref 6.0–8.3)

## 2011-10-13 LAB — URINALYSIS, ROUTINE W REFLEX MICROSCOPIC
Bilirubin Urine: NEGATIVE
Glucose, UA: NEGATIVE mg/dL
Ketones, ur: NEGATIVE mg/dL
Leukocytes, UA: NEGATIVE
Nitrite: NEGATIVE
Protein, ur: NEGATIVE mg/dL
Specific Gravity, Urine: >1.046 — ABNORMAL HIGH (ref 1.005–1.030)
Urobilinogen, UA: 0.2 mg/dL (ref 0.0–1.0)
pH: 6 (ref 5.0–8.0)

## 2011-10-13 LAB — LIPASE, BLOOD: Lipase: 9 U/L — ABNORMAL LOW (ref 11–59)

## 2011-10-13 LAB — URINE MICROSCOPIC-ADD ON

## 2011-10-13 LAB — TROPONIN I
Troponin I: 0.96 ng/mL (ref <0.30)
Troponin I: <0.3 ng/mL (ref <0.30)
Troponin I: <0.3 ng/mL (ref <0.30)

## 2011-10-13 LAB — PROCALCITONIN: Procalcitonin: 1.31 ng/mL

## 2011-10-13 LAB — MRSA PCR SCREENING: MRSA by PCR: NEGATIVE

## 2011-10-13 LAB — LACTIC ACID, PLASMA: Lactic Acid, Venous: 1.4 mmol/L (ref 0.5–2.2)

## 2011-10-13 MED ORDER — CYCLOBENZAPRINE HCL 10 MG PO TABS
10.0000 mg | ORAL_TABLET | Freq: Three times a day (TID) | ORAL | Status: DC | PRN
  Administered 2011-10-14 – 2011-10-19 (×2): 10 mg via ORAL
  Filled 2011-10-13 (×3): qty 1

## 2011-10-13 MED ORDER — HYDROMORPHONE HCL 2 MG PO TABS
2.0000 mg | ORAL_TABLET | Freq: Four times a day (QID) | ORAL | Status: DC | PRN
  Administered 2011-10-13 – 2011-10-18 (×5): 2 mg via ORAL
  Filled 2011-10-13 (×5): qty 1

## 2011-10-13 MED ORDER — SODIUM CHLORIDE 0.9 % IJ SOLN
3.0000 mL | Freq: Two times a day (BID) | INTRAMUSCULAR | Status: DC
  Administered 2011-10-13 – 2011-10-20 (×8): 3 mL via INTRAVENOUS

## 2011-10-13 MED ORDER — ALBUTEROL SULFATE HFA 108 (90 BASE) MCG/ACT IN AERS: 1.0000 | INHALATION_SPRAY | Freq: Three times a day (TID) | RESPIRATORY_TRACT | Status: DC | PRN

## 2011-10-13 MED ORDER — ONDANSETRON HCL 4 MG/2ML IJ SOLN
4.0000 mg | Freq: Four times a day (QID) | INTRAMUSCULAR | Status: DC | PRN
  Administered 2011-10-13 – 2011-10-20 (×3): 4 mg via INTRAVENOUS
  Filled 2011-10-13 (×3): qty 2

## 2011-10-13 MED ORDER — ACETAMINOPHEN 325 MG PO TABS
650.0000 mg | ORAL_TABLET | Freq: Four times a day (QID) | ORAL | Status: DC | PRN
  Administered 2011-10-13 – 2011-10-18 (×6): 650 mg via ORAL
  Filled 2011-10-13 (×6): qty 2

## 2011-10-13 MED ORDER — FAMOTIDINE 20 MG PO TABS
20.0000 mg | ORAL_TABLET | Freq: Every day | ORAL | Status: DC
  Administered 2011-10-13 – 2011-10-20 (×8): 20 mg via ORAL
  Filled 2011-10-13 (×8): qty 1

## 2011-10-13 MED ORDER — ONDANSETRON HCL 4 MG PO TABS: 4.0000 mg | ORAL_TABLET | Freq: Four times a day (QID) | ORAL | Status: DC | PRN

## 2011-10-13 MED ORDER — NITROGLYCERIN 0.4 MG SL SUBL: 0.4000 mg | SUBLINGUAL_TABLET | SUBLINGUAL | Status: DC | PRN

## 2011-10-13 MED ORDER — BUTALBITAL-APAP-CAFFEINE 50-325-40 MG PO TABS
1.0000 | ORAL_TABLET | Freq: Three times a day (TID) | ORAL | Status: DC | PRN
  Administered 2011-10-13 – 2011-10-20 (×13): 1 via ORAL
  Filled 2011-10-13 (×14): qty 1

## 2011-10-13 MED ORDER — BUDESONIDE-FORMOTEROL FUMARATE 160-4.5 MCG/ACT IN AERO
2.0000 | INHALATION_SPRAY | Freq: Two times a day (BID) | RESPIRATORY_TRACT | Status: DC
  Administered 2011-10-13 – 2011-10-20 (×15): 2 via RESPIRATORY_TRACT
  Filled 2011-10-13: qty 6

## 2011-10-13 MED ORDER — SODIUM CHLORIDE 0.9 % IV SOLN: INTRAVENOUS | Status: AC

## 2011-10-13 MED ORDER — GABAPENTIN 100 MG PO CAPS
100.0000 mg | ORAL_CAPSULE | Freq: Three times a day (TID) | ORAL | Status: DC
  Administered 2011-10-13 – 2011-10-20 (×22): 100 mg via ORAL
  Filled 2011-10-13 (×27): qty 1

## 2011-10-13 MED ORDER — FLUTICASONE PROPIONATE 50 MCG/ACT NA SUSP
2.0000 | Freq: Every day | NASAL | Status: DC
  Administered 2011-10-13 – 2011-10-19 (×7): 2 via NASAL
  Filled 2011-10-13: qty 16

## 2011-10-13 MED ORDER — METRONIDAZOLE IN NACL 5-0.79 MG/ML-% IV SOLN
500.0000 mg | Freq: Three times a day (TID) | INTRAVENOUS | Status: DC
  Administered 2011-10-13 – 2011-10-16 (×10): 500 mg via INTRAVENOUS
  Filled 2011-10-13 (×12): qty 100

## 2011-10-13 MED ORDER — DICLOFENAC SODIUM 1 % TD GEL
2.0000 g | Freq: Four times a day (QID) | TRANSDERMAL | Status: DC | PRN
  Filled 2011-10-13: qty 100

## 2011-10-13 MED ORDER — NEBIVOLOL HCL 5 MG PO TABS
5.0000 mg | ORAL_TABLET | Freq: Every day | ORAL | Status: DC
  Administered 2011-10-13 – 2011-10-14 (×2): 5 mg via ORAL
  Filled 2011-10-13 (×4): qty 1

## 2011-10-13 MED ORDER — TIOTROPIUM BROMIDE MONOHYDRATE 18 MCG IN CAPS
18.0000 ug | ORAL_CAPSULE | Freq: Every day | RESPIRATORY_TRACT | Status: DC
  Administered 2011-10-13 – 2011-10-20 (×8): 18 ug via RESPIRATORY_TRACT
  Filled 2011-10-13 (×2): qty 5

## 2011-10-13 MED ORDER — CIPROFLOXACIN IN D5W 400 MG/200ML IV SOLN
400.0000 mg | Freq: Two times a day (BID) | INTRAVENOUS | Status: DC
  Administered 2011-10-13 – 2011-10-14 (×2): 400 mg via INTRAVENOUS
  Filled 2011-10-13 (×3): qty 200

## 2011-10-13 MED ORDER — ACETAMINOPHEN 650 MG RE SUPP: 650.0000 mg | Freq: Four times a day (QID) | RECTAL | Status: DC | PRN

## 2011-10-13 MED ORDER — HYOSCYAMINE SULFATE 0.125 MG PO TBDP
0.1250 mg | ORAL_TABLET | Freq: Every day | ORAL | Status: DC
  Administered 2011-10-13: 0.125 mg via SUBLINGUAL
  Filled 2011-10-13 (×4): qty 1

## 2011-10-13 MED ORDER — POTASSIUM CHLORIDE IN NACL 20-0.9 MEQ/L-% IV SOLN
INTRAVENOUS | Status: DC
  Administered 2011-10-13: 100 mL/h via INTRAVENOUS
  Administered 2011-10-13: 15:00:00 via INTRAVENOUS
  Administered 2011-10-14: 100 mL/h via INTRAVENOUS
  Filled 2011-10-13 (×5): qty 1000

## 2011-10-13 NOTE — Progress Notes (Signed)
ANTIBIOTIC CONSULT NOTE - INITIAL  Pharmacy Consult for Cipro Indication: Colitis  Allergies  Allergen Reactions  . Codeine     REACTION: n/v/d, HA  . Morphine     REACTION: n/v/d, HA  . Sulfa Antibiotics   . Zoledronic Acid     REACTION: Severe edema    Patient Measurements: Height: 5' 1.02" (155 cm) Weight: 121 lb 14.6 oz (55.3 kg) IBW/kg (Calculated) : 47.86    Vital Signs: Temp: 100.3 F (37.9 C) (09/15 2316) Temp src: Rectal (09/15 2316) BP: 169/85 mmHg (09/15 2316) Pulse Rate: 132  (09/15 2316) Intake/Output from previous day:   Intake/Output from this shift:    Labs:  [facility] 10/12/11 1859  WBC 23.7*  HGB 14.7  PLT 185  LABCREA --  CREATININE 0.69   Estimated Creatinine Clearance: 39.6 ml/min (by C-G formula based on Cr of 0.69). No results found for this basename: VANCOTROUGH:2,VANCOPEAK:2,VANCORANDOM:2,GENTTROUGH:2,GENTPEAK:2,GENTRANDOM:2,TOBRATROUGH:2,TOBRAPEAK:2,TOBRARND:2,AMIKACINPEAK:2,AMIKACINTROU:2,AMIKACIN:2, in the last 72 hours   Microbiology: No results found for this or any previous visit (from the past 720 hour(s)).  Medical History: Past Medical History  Diagnosis Date  . Osteoporosis   . Arthritis     osteoarthritis  . GERD (gastroesophageal reflux disease)   . Hiatal hernia   . COPD (chronic obstructive pulmonary disease)   . Pneumonia   . Hypotension   . PUD (peptic ulcer disease)   . Cardiac arrhythmia   . Hyperlipidemia   . Heart failure   . Hypertension   . Scoliosis deformity of spine     history of intractable back pain  . Choledocholithiasis   . Pancolitis     Infectious vs. inflammatory  . Abdominal pain     Resolved  . Tachycardia   . Chronic anemia   . Fecal impaction     Medications:  Scheduled:    . sodium chloride   Intravenous STAT  . budesonide-formoterol  2 puff Inhalation BID  . ciprofloxacin  400 mg Intravenous Once  . fentaNYL  50 mcg Intravenous Once  . fluticasone  2 spray Each Nare Daily    . gabapentin  100 mg Oral TID  .  HYDROmorphone (DILAUDID) injection  0.5 mg Intravenous Once  .  HYDROmorphone (DILAUDID) injection  1 mg Intravenous Once  . metronidazole  500 mg Intravenous Once  . metronidazole  500 mg Intravenous Q8H  . nebivolol  5 mg Oral Daily  . ondansetron (ZOFRAN) IV  4 mg Intravenous Once  . ondansetron (ZOFRAN) IV  4 mg Intravenous Once  . ondansetron  4 mg Intravenous Once  . potassium chloride  10 mEq Intravenous Once  . sodium chloride  1,000 mL Intravenous Once  . sodium chloride  1,000 mL Intravenous Once  . sodium chloride  3 mL Intravenous Q12H  . DISCONTD: fentaNYL  50 mcg Intravenous Once   Infusions:    . 0.9 % NaCl with KCl 20 mEq / L    . DISCONTD: sodium chloride     Assessment:  78 year old female with initial complaint of abdominal pain, N/V, headache  Received Cipro 400mg  IV x 1 in ED @ 02:46 and Flagyl 500mg  IV x 1 in ED @ 00:49  IV Flagyl (MD dosing) and Cipro (Rx dosing) to continue for colitis  No renal adjustment for Cipro warranted at this point as CrCl > 30 ml/min  Goal of Therapy:  Eradication of infection  Plan:   Cipro 400mg  IV q12h  Follow renal function, cultures  Diavion Labrador, Joselyn Glassman, PharmD 10/13/2011,3:13  AM

## 2011-10-13 NOTE — Progress Notes (Signed)
Troponin elevated at 0.96  Patient denies chest pain or dysnpea.   Resting quietly, arouses easily and although sleepy she is oriented. Vital signs stable: HR 96 sinus rhythm on monitor. 12 lead EKG in process and serial troponins have been ordered.  [name] [name].

## 2011-10-13 NOTE — H&P (Signed)
[name] [name] is an 78 y.o. female.   Patient was seen and examined on October 13, 2011 at 1 AM. PCP - Dr. Ty Hilts. Chief Complaint: Abdominal pain. HPI: 78 year-old female with history of COPD on home oxygen, hypertension, history of pancreatitis and had undergone ERCP with sphincterotomy and subsequently cholecystectomy presents with complaints of abdominal pain. The abdominal pain started off early in the morning. The pain has been more in the epigastric area episodic crampy associated nausea no vomiting denies any diarrhea. Has subjective feeling of fever chills. Patient's records show patient has been on antibiotics last month for bronchitis. In addition patient also is complaining of frontal headache which started off same time. Patient denies any blurred vision or focal deficits oral neck pain. In the ER patient was found to be tachycardic with leukocytosis. Patient's CT abdomen pelvis shows diffuse pancolitis. Lactic acid levels were normal. Patient is admitted for further management.  Past Medical History  Diagnosis Date  . Osteoporosis   . Arthritis     osteoarthritis  . GERD (gastroesophageal reflux disease)   . Hiatal hernia   . COPD (chronic obstructive pulmonary disease)   . Pneumonia   . Hypotension   . PUD (peptic ulcer disease)   . Cardiac arrhythmia   . Hyperlipidemia   . Heart failure   . Hypertension   . Scoliosis deformity of spine     history of intractable back pain  . Choledocholithiasis   . Pancolitis     Infectious vs. inflammatory  . Abdominal pain     Resolved  . Tachycardia   . Chronic anemia   . Fecal impaction     Past Surgical History  Procedure Date  . Cholecystectomy 2008  . Abdominal hysterectomy 1970s  . Spine surgery [phone]  . Neck surgery 1970s  . Spinal cord stimulator implant   . Ercp w/ sphicterotomy 02/2010    Family History  Problem Relation Age of Onset  . Cancer Mother     uterine  . Heart disease Father   .  Hypertension Other    Social History:  reports that she quit smoking about 23 years ago. Her smoking use included Cigarettes. She has a 12 pack-year smoking history. She has never used smokeless tobacco. She reports that she does not drink alcohol or use illicit drugs.  Allergies:  Allergies  Allergen Reactions  . Codeine     REACTION: n/v/d, HA  . Morphine     REACTION: n/v/d, HA  . Zoledronic Acid     REACTION: Severe edema     (Not in a hospital admission)  Results for orders placed during the hospital encounter of 10/12/11 (from the past 48 hour(s))  COMPREHENSIVE METABOLIC PANEL     Status: Abnormal   Collection Time   10/12/11  6:59 PM      Component Value Range Comment   Sodium 137  135 - 145 mEq/L    Potassium 3.4 (*) 3.5 - 5.1 mEq/L    Chloride 95 (*) 96 - 112 mEq/L    CO2 30  19 - 32 mEq/L    Glucose, Bld 195 (*) 70 - 99 mg/dL    BUN 14  6 - 23 mg/dL    Creatinine, Ser 1.19  0.50 - 1.10 mg/dL    Calcium 9.6  8.4 - 14.7 mg/dL    Total Protein 6.9  6.0 - 8.3 g/dL    Albumin 3.7  3.5 - 5.2 g/dL    AST 22  0 - 37 U/L    ALT 12  0 - 35 U/L    Alkaline Phosphatase 137 (*) 39 - 117 U/L    Total Bilirubin 0.3  0.3 - 1.2 mg/dL    GFR calc non Af Amer 78 (*) >90 mL/min    GFR calc Af Amer >90  >90 mL/min   CBC WITH DIFFERENTIAL     Status: Abnormal   Collection Time   10/12/11  6:59 PM      Component Value Range Comment   WBC 23.7 (*) 4.0 - 10.5 K/uL    RBC 4.80  3.87 - 5.11 MIL/uL    Hemoglobin 14.7  12.0 - 15.0 g/dL    HCT 16.1  09.6 - 04.5 %    MCV 92.7  78.0 - 100.0 fL    MCH 30.6  26.0 - 34.0 pg    MCHC 33.0  30.0 - 36.0 g/dL    RDW 40.9  81.1 - 91.4 %    Platelets 185  150 - 400 K/uL    Neutrophils Relative 94 (*) 43 - 77 %    Neutro Abs 22.1 (*) 1.7 - 7.7 K/uL    Lymphocytes Relative 2 (*) 12 - 46 %    Lymphs Abs 0.4 (*) 0.7 - 4.0 K/uL    Monocytes Relative 5  3 - 12 %    Monocytes Absolute 1.1 (*) 0.1 - 1.0 K/uL    Eosinophils Relative 0  0 - 5 %     Eosinophils Absolute 0.0  0.0 - 0.7 K/uL    Basophils Relative 0  0 - 1 %    Basophils Absolute 0.0  0.0 - 0.1 K/uL   OCCULT BLOOD, POC DEVICE     Status: Normal   Collection Time   10/12/11  7:02 PM      Component Value Range Comment   Fecal Occult Bld POSITIVE     URINALYSIS, ROUTINE W REFLEX MICROSCOPIC     Status: Abnormal   Collection Time   10/12/11  7:20 PM      Component Value Range Comment   Color, Urine YELLOW  YELLOW    APPearance CLEAR  CLEAR    Specific Gravity, Urine 1.020  1.005 - 1.030    pH 6.0  5.0 - 8.0    Glucose, UA NEGATIVE  NEGATIVE mg/dL    Hgb urine dipstick NEGATIVE  NEGATIVE    Bilirubin Urine NEGATIVE  NEGATIVE    Ketones, ur 40 (*) NEGATIVE mg/dL    Protein, ur 30 (*) NEGATIVE mg/dL    Urobilinogen, UA 0.2  0.0 - 1.0 mg/dL    Nitrite NEGATIVE  NEGATIVE    Leukocytes, UA NEGATIVE  NEGATIVE   URINE MICROSCOPIC-ADD ON     Status: Normal   Collection Time   10/12/11  7:20 PM      Component Value Range Comment   RBC / HPF 0-2  <3 RBC/hpf   LACTIC ACID, PLASMA     Status: Normal   Collection Time   10/12/11  7:43 PM      Component Value Range Comment   Lactic Acid, Venous 2.1  0.5 - 2.2 mmol/L   PROCALCITONIN     Status: Normal   Collection Time   10/13/11 12:20 AM      Component Value Range Comment   Procalcitonin 1.31      Ct Head Wo Contrast  10/12/2011  *RADIOLOGY REPORT*  Clinical Data: Abdominal pain with nausea, emesis, and headache  CT HEAD WITHOUT CONTRAST  Technique:  Contiguous axial images were obtained from the base of the skull through the vertex without contrast.  Comparison: 12/02/2009  Findings: There is no evidence for acute infarction, intracranial hemorrhage, mass lesion, hydrocephalus, or extra-axial fluid. Slight atrophy.  Mild chronic microvascular ischemic change. Intact calvarium.  Clear sinuses and mastoids.  Bilateral cataract extraction.  IMPRESSION: Mild atrophy with chronic microvascular ischemic change.  No acute stroke or  hemorrhage.   Original Report Authenticated By: Elsie Stain, M.D.    Ct Abdomen Pelvis W Contrast  10/12/2011  *RADIOLOGY REPORT*  Clinical Data: Abdominal pain.  Nausea and vomiting.  CT ABDOMEN AND PELVIS WITH CONTRAST  Technique:  Multidetector CT imaging of the abdomen and pelvis was performed following the standard protocol during bolus administration of intravenous contrast.  Contrast: OMNIPAQUE IOHEXOL 300 MG/ML  SOLN  Comparison: Chest and two views abdomen earlier this same date.  CT abdomen and pelvis 03/02/2010.  Findings: There is no pleural or pericardial effusion.  The lung bases demonstrate some dependent atelectasis.  Pneumobilia is identified as on the prior examination.  The patient is status post cholecystectomy.  The spleen, adrenal glands, kidneys and pancreas appear normal.  There is diffuse wall thickening of the colon.  No pneumatosis, portal venous gas or free intraperitoneal air is identified.  A small amount of free pelvic fluid is seen.  The urinary bladder is incompletely distended.  The walls appears hyperenhancing.  No lymphadenopathy is identified.  The patient has severe convex right scoliosis.  Spinal stimulator device is noted.  IMPRESSION:  1.  Pancolitis. 2.  Incompletely distended urinary bladder demonstrates an enhancing wall and possible wall thickening worrisome for cystitis. 3.  Unchanged pneumobilia is consistent with prior sphincterotomy.   Original Report Authenticated By: Bernadene Bell. D'ALESSIO, M.D.    Dg Abd Acute W/chest  10/12/2011  *RADIOLOGY REPORT*  Clinical Data: Abdominal pain with vomiting and diarrhea.  ACUTE ABDOMEN SERIES (ABDOMEN 2 VIEW & CHEST 1 VIEW)  Comparison: Chest radiographs 11/12/2010 an acute abdominal series 03/01/2010.  Findings: The heart size is stable.  There is new right hilar fullness which may reflect a perihilar infiltrate or ill-defined mass.  The left lung is clear.  There is no significant pleural effusion.  A thoracic  spinal stimulator is noted.  The abdomen is nearly gasless.  There is no free intraperitoneal air or evidence of significantly distended bowel.  The patient has a severe convex right scoliosis with associated spondylosis. Cholecystectomy clips are noted.  An intrathecal spinal pump and its catheter appear intact.  IMPRESSION:  1.  New right perihilar infiltrate or ill-defined mass.  Correlate clinically.  This needs radiographic followup to exclude neoplasm. 2.  Nearly gasless abdomen without evidence of obstruction or free air.   Original Report Authenticated By: Gerrianne Scale, M.D.     Review of Systems  Constitutional: Positive for fever and diaphoresis.  Eyes: Negative.   Respiratory: Negative.   Cardiovascular: Negative.   Gastrointestinal: Positive for nausea and abdominal pain.  Genitourinary: Negative.   Musculoskeletal: Negative.   Skin: Negative.   Neurological: Positive for headaches.  Endo/Heme/Allergies: Negative.   Psychiatric/Behavioral: Negative.     Blood pressure 169/85, pulse 132, temperature 100.3 F (37.9 C), temperature source Rectal, resp. rate 20, SpO2 99.00%. Physical Exam  Constitutional: She is oriented to person, place, and time. She appears well-developed and well-nourished. No distress.  HENT:  Head: Normocephalic and atraumatic.  Right Ear: External ear  normal.  Left Ear: External ear normal.  Nose: Nose normal.  Mouth/Throat: Oropharynx is clear and moist. No oropharyngeal exudate.  Eyes: Conjunctivae normal are normal. Pupils are equal, round, and reactive to light. Right eye exhibits no discharge. Left eye exhibits no discharge. No scleral icterus.  Neck: Normal range of motion. Neck supple.  Cardiovascular: Normal rate and regular rhythm.   Respiratory: Effort normal and breath sounds normal. No respiratory distress. She has no wheezes. She has no rales.  GI: Soft. Bowel sounds are normal. She exhibits no distension. There is no tenderness. There  is no rebound.  Musculoskeletal: Normal range of motion. She exhibits no edema and no tenderness.  Neurological: She is alert and oriented to person, place, and time.       Moves all extremities.  Skin: Skin is warm and dry. No rash noted. She is not diaphoretic. No erythema.  Psychiatric: Her behavior is normal.     Assessment/Plan #1. SIRS secondary to pancolitis - at this time and colitis concerning for C. difficile colitis. C. difficile PCR as been ordered. Stool cultures have been ordered. Until results are available patient will be on Flagyl and ciprofloxacin. Gentle hydration. Patient had a similar picture 2 years ago. Lipase is pending. #2. Leukocytosis probably secondary #1 - blood cultures and urine culture been ordered. Follow CBC trend. #3. Headache probably precipitated by #1. #4. Chronic pain on Dilaudid pump. #5. COPD with no exacerbation - continue home medications. #6. Hypertension - continue home medications. #7. History of CHF/cor pulmonale - presently not short of breath and does not look decompensated. Due to patient's pancolitis patient will be gently hydrated.  CODE STATUS - full code.  Toniyah Dilmore N. 10/13/2011, 1:22 AM

## 2011-10-13 NOTE — Progress Notes (Signed)
CRITICAL VALUE ALERT  Critical value received:  Troponin 0.96  Date of notification:  10/13/11  Time of notification:  0535  Critical value read back: yes  Nurse who received alert:  Barkley Bruns RN CCRN  MD notified (1st page):  E-link  Time of first page:  [phone]  MD notified (2nd page):M. Burnadette Peter NP  Time of second page:N/A  Responding MD:  M. Burnadette Peter NP  Time MD responded:  [phone]

## 2011-10-13 NOTE — Progress Notes (Signed)
I have seen and assessed patient and agree with Dr Katherene Ponto assessment and plan. Patient does have a pancolitis and per patient and daughter was having black/melanotic stools. Patient states abdominal pain is improving. Stool studies are pending. Initial set of troponin was elevated. Will order serial enzymes and check a 2-D echo. If 2-D echo is normal and patient remains asymptomatic elevated troponins likely secondary to serous/sepsis. If 2-D echo is abnormal we'll consult with cardiology for further evaluation and management. Will also consult with GI for patient's pancolitis and black/melanotic stools. We'll place patient on Pepcid. Follow.

## 2011-10-13 NOTE — Consult Note (Signed)
Subjective:   HPI  The patient is an 78 year old female who we are asked to see in consultation. She states that she woke up at about 1:30 in the morning with abdominal pain in the upper abdomen, vomiting, and diarrhea. She states the stools were somewhat blackish in color and she did not see red stool. A CT scan of the abdomen was done which showed evidence of colitis. There is report of her having been on some antibiotics in the recent past for bronchitis. Stool studies were ordered for culture and Clostridium difficile however the patient states that she has not had a stool since she's been in the hospital. She feels much better today and is not having any further abdominal pain today, or diarrhea. Her hemoglobin was 14.7.  Review of Systems Currently denies chest pain or shortness of breath.  Past Medical History  Diagnosis Date  . Osteoporosis   . Arthritis     osteoarthritis  . GERD (gastroesophageal reflux disease)   . Hiatal hernia   . COPD (chronic obstructive pulmonary disease)   . Pneumonia   . Hypotension   . PUD (peptic ulcer disease)   . Cardiac arrhythmia   . Hyperlipidemia   . Heart failure   . Hypertension   . Scoliosis deformity of spine     history of intractable back pain  . Choledocholithiasis   . Pancolitis     Infectious vs. inflammatory  . Abdominal pain     Resolved  . Tachycardia   . Chronic anemia   . Fecal impaction   . Chronic back pain   . Pancreatitis     History of  . CHF (congestive heart failure)    Past Surgical History  Procedure Date  . Cholecystectomy 2008  . Abdominal hysterectomy 1970s  . Spine surgery [phone]  . Neck surgery 1970s  . Spinal cord stimulator implant   . Ercp w/ sphicterotomy 02/2010  . Back surgery     x 5   History   Social History  . Marital Status: Widowed    Spouse Name: N/A    Number of Children: 3  . Years of Education: N/A   Occupational History  .     Social History Main Topics  .  Smoking status: Former Smoker -- 1.0 packs/day for 12 years    Types: Cigarettes    Quit date: 01/28/1988  . Smokeless tobacco: Never Used  . Alcohol Use: No  . Drug Use: No  . Sexually Active: No   Other Topics Concern  . Not on file   Social History Narrative   1 grandchild at care link--AngeliaLives with great-granddaughter   family history includes Cancer in her mother; Heart disease in her father; and Hypertension in her other. Current facility-administered medications:0.9 %  sodium chloride infusion, , Intravenous, STAT, Sam Jacubowitz, MD;  0.9 % NaCl with KCl 20 mEq/ L  infusion, , Intravenous, Continuous, Eduard Clos, MD, Last Rate: 100 mL/hr at 10/13/11 1444;  acetaminophen (TYLENOL) suppository 650 mg, 650 mg, Rectal, Q6H PRN, Eduard Clos, MD acetaminophen (TYLENOL) tablet 650 mg, 650 mg, Oral, Q6H PRN, Eduard Clos, MD, 650 mg at 10/13/11 1259;  albuterol (PROVENTIL HFA;VENTOLIN HFA) 108 (90 BASE) MCG/ACT inhaler 1 puff, 1 puff, Inhalation, TID PRN, Eduard Clos, MD;  budesonide-formoterol (SYMBICORT) 160-4.5 MCG/ACT inhaler 2 puff, 2 puff, Inhalation, BID, Eduard Clos, MD, 2 puff at 10/13/11 1022 butalbital-acetaminophen-caffeine (FIORICET, ESGIC) 50-325-40 MG per tablet 1 tablet, 1 tablet, Oral,  Q8H PRN, Eduard Clos, MD, 1 tablet at 10/13/11 0750;  ciprofloxacin (CIPRO) IVPB 400 mg, 400 mg, Intravenous, Once, Doug Sou, MD, 400 mg at 10/13/11 0246;  ciprofloxacin (CIPRO) IVPB 400 mg, 400 mg, Intravenous, Q12H, Leann Trefz Poindexter, PHARMD;  cyclobenzaprine (FLEXERIL) tablet 10 mg, 10 mg, Oral, TID PRN, Eduard Clos, MD diclofenac sodium (VOLTAREN) 1 % transdermal gel 2 g, 2 g, Topical, QID PRN, Rodolph Bong, MD;  famotidine (PEPCID) tablet 20 mg, 20 mg, Oral, Daily, Rodolph Bong, MD, 20 mg at 10/13/11 1258;  fentaNYL (SUBLIMAZE) injection 50 mcg, 50 mcg, Intravenous, Once, Doug Sou, MD, 50 mcg at 10/12/11  1858;  fluticasone (FLONASE) 50 MCG/ACT nasal spray 2 spray, 2 spray, Each Nare, Daily, Eduard Clos, MD, 2 spray at 10/13/11 1120 gabapentin (NEURONTIN) capsule 100 mg, 100 mg, Oral, TID, Eduard Clos, MD, 100 mg at 10/13/11 1258;  HYDROmorphone (DILAUDID) injection 0.5 mg, 0.5 mg, Intravenous, Once, Doug Sou, MD, 0.5 mg at 10/13/11 0015;  HYDROmorphone (DILAUDID) injection 1 mg, 1 mg, Intravenous, Once, Doug Sou, MD, 1 mg at 10/12/11 2010;  HYDROmorphone (DILAUDID) tablet 2 mg, 2 mg, Oral, Q6H PRN, Rodolph Bong, MD, 2 mg at 10/13/11 1120 hyoscyamine (ANASPAZ) tablet 0.125 mg, 0.125 mg, Sublingual, Daily, Rodolph Bong, MD, 0.125 mg at 10/13/11 1300;  iohexol (OMNIPAQUE) 300 MG/ML solution 100 mL, 100 mL, Intravenous, Once PRN, Medication Radiologist, MD, 100 mL at 10/12/11 2239;  metroNIDAZOLE (FLAGYL) IVPB 500 mg, 500 mg, Intravenous, Once, Doug Sou, MD, 500 mg at 10/13/11 0049 metroNIDAZOLE (FLAGYL) IVPB 500 mg, 500 mg, Intravenous, Q8H, Eduard Clos, MD, 500 mg at 10/13/11 0750;  nebivolol (BYSTOLIC) tablet 5 mg, 5 mg, Oral, Daily, Eduard Clos, MD, 5 mg at 10/13/11 1259;  nitroGLYCERIN (NITROSTAT) SL tablet 0.4 mg, 0.4 mg, Sublingual, Q5 min PRN, Eduard Clos, MD;  ondansetron [facility]) injection 4 mg, 4 mg, Intravenous, Once, Doug Sou, MD, 4 mg at 10/12/11 2142 ondansetron (ZOFRAN) injection 4 mg, 4 mg, Intravenous, Once, Doug Sou, MD, 4 mg at 10/12/11 2210;  ondansetron (ZOFRAN) injection 4 mg, 4 mg, Intravenous, Once, Doug Sou, MD;  ondansetron (ZOFRAN) injection 4 mg, 4 mg, Intravenous, Q6H PRN, Eduard Clos, MD;  ondansetron (ZOFRAN) tablet 4 mg, 4 mg, Oral, Q6H PRN, Eduard Clos, MD potassium chloride 10 mEq in 100 mL IVPB, 10 mEq, Intravenous, Once, Doug Sou, MD, 10 mEq at 10/13/11 0045;  sodium chloride 0.9 % bolus 1,000 mL, 1,000 mL, Intravenous, Once, Doug Sou, MD, 1,000 mL at 10/12/11 1857;   sodium chloride 0.9 % bolus 1,000 mL, 1,000 mL, Intravenous, Once, Doug Sou, MD, 1,000 mL at 10/12/11 2010 sodium chloride 0.9 % injection 3 mL, 3 mL, Intravenous, Q12H, Eduard Clos, MD, 3 mL at 10/13/11 1300;  tiotropium (SPIRIVA) inhalation capsule 18 mcg, 18 mcg, Inhalation, Daily, Rodolph Bong, MD, 18 mcg at 10/13/11 1234;  DISCONTD: 0.9 %  sodium chloride infusion, , Intravenous, Continuous, Doug Sou, MD;  DISCONTD: fentaNYL (SUBLIMAZE) injection 50 mcg, 50 mcg, Intravenous, Once, Doug Sou, MD Allergies  Allergen Reactions  . Codeine     REACTION: n/v/d, HA  . Morphine     REACTION: n/v/d, HA  . Sulfa Antibiotics   . Zoledronic Acid     REACTION: Severe edema     Objective:     BP 98/56  Pulse 94  Temp 98.1 F (36.7 C) (Oral)  Resp 14  Ht 5' (1.524 m)  Wt  54 kg (119 lb 0.8 oz)  BMI 23.25 kg/m2  SpO2 99%  She is in no distress  Heart regular rhythm no murmurs  Lungs clear  Abdomen is soft, bowel sounds are normal, it is nontender  Laboratory No components found with this basename: d1      Assessment:     Colitis based on CT scan. The acute onset of this sounds suspicious of an infectious etiology. She has not had any further diarrhea since she's been here. No stool therefore was collected.      Plan:     At this time she seems very stable and is in no distress and is not having any abdominal pain. Given her age and multiple medical problems I would continue to treat her medically and observe her clinical course. I would not be inclined at this time to rush in to do any type of endoscopic procedures. We will follow her with you. Lab Results  Component Value Date   HGB 14.6 10/13/2011   HGB 14.7 10/12/2011   HGB 12.7 03/18/2011   HCT 44.2 10/13/2011   HCT 44.5 10/12/2011   HCT 38.4 03/18/2011   ALKPHOS 115 10/13/2011   ALKPHOS 137* 10/12/2011   ALKPHOS 140* 03/18/2011   AST 17 10/13/2011   AST 22 10/12/2011   AST 21 03/18/2011   ALT 9  10/13/2011   ALT 12 10/12/2011   ALT 13 03/18/2011   AMYLASE 19 12/06/2009   AMYLASE 21 12/04/2009

## 2011-10-13 NOTE — Progress Notes (Signed)
  Echocardiogram 2D Echocardiogram has been performed.  Shalva Rozycki 10/13/2011, 11:09 AM

## 2011-10-13 NOTE — Progress Notes (Signed)
eLink Physician-Brief Progress Note Patient Name: [name] [name] DOB: 1925-05-15 MRN: [id number]  Date of Service  10/13/2011   HPI/Events of Note   Called by bedside RN to report positive troponins.  eICU Interventions  Asked to notify primary.      Sabrinna Yearwood 10/13/2011, 5:35 AMC

## 2011-10-14 DIAGNOSIS — A419 Sepsis, unspecified organism: Secondary | ICD-10-CM

## 2011-10-14 DIAGNOSIS — R918 Other nonspecific abnormal finding of lung field: Secondary | ICD-10-CM | POA: Diagnosis present

## 2011-10-14 DIAGNOSIS — R748 Abnormal levels of other serum enzymes: Secondary | ICD-10-CM

## 2011-10-14 LAB — CBC WITH DIFFERENTIAL/PLATELET
Basophils Absolute: 0 10*3/uL (ref 0.0–0.1)
Basophils Relative: 0 % (ref 0–1)
Eosinophils Absolute: 0 10*3/uL (ref 0.0–0.7)
Eosinophils Relative: 1 % (ref 0–5)
HCT: 32.6 % — ABNORMAL LOW (ref 36.0–46.0)
Hemoglobin: 10.7 g/dL — ABNORMAL LOW (ref 12.0–15.0)
Lymphocytes Relative: 12 % (ref 12–46)
Lymphs Abs: 0.8 10*3/uL (ref 0.7–4.0)
MCH: 30.7 pg (ref 26.0–34.0)
MCHC: 32 g/dL (ref 30.0–36.0)
MCV: 95.9 fL (ref 78.0–100.0)
Monocytes Absolute: 0.5 10*3/uL (ref 0.1–1.0)
Monocytes Relative: 8 % (ref 3–12)
Neutro Abs: 5.2 10*3/uL (ref 1.7–7.7)
Neutrophils Relative %: 79 % — ABNORMAL HIGH (ref 43–77)
Platelets: 121 10*3/uL — ABNORMAL LOW (ref 150–400)
RBC: 3.45 MIL/uL — ABNORMAL LOW (ref 3.87–5.11)
RDW: 14.1 % (ref 11.5–15.5)
WBC: 6.6 10*3/uL (ref 4.0–10.5)

## 2011-10-14 LAB — COMPREHENSIVE METABOLIC PANEL
ALT: 7 U/L (ref 0–35)
AST: 13 U/L (ref 0–37)
Albumin: 2.4 g/dL — ABNORMAL LOW (ref 3.5–5.2)
Alkaline Phosphatase: 70 U/L (ref 39–117)
BUN: 10 mg/dL (ref 6–23)
CO2: 29 mEq/L (ref 19–32)
Calcium: 7.4 mg/dL — ABNORMAL LOW (ref 8.4–10.5)
Chloride: 105 mEq/L (ref 96–112)
Creatinine, Ser: 0.72 mg/dL (ref 0.50–1.10)
GFR calc Af Amer: 89 mL/min — ABNORMAL LOW (ref >90)
GFR calc non Af Amer: 77 mL/min — ABNORMAL LOW (ref >90)
Glucose, Bld: 137 mg/dL — ABNORMAL HIGH (ref 70–99)
Potassium: 4.2 mEq/L (ref 3.5–5.1)
Sodium: 138 mEq/L (ref 135–145)
Total Bilirubin: 0.2 mg/dL — ABNORMAL LOW (ref 0.3–1.2)
Total Protein: 4.6 g/dL — ABNORMAL LOW (ref 6.0–8.3)

## 2011-10-14 LAB — CLOSTRIDIUM DIFFICILE BY PCR: Toxigenic C. Difficile by PCR: NEGATIVE

## 2011-10-14 LAB — URINE CULTURE
Colony Count: NO GROWTH
Culture: NO GROWTH

## 2011-10-14 LAB — MAGNESIUM: Magnesium: 1.6 mg/dL (ref 1.5–2.5)

## 2011-10-14 MED ORDER — SODIUM CHLORIDE 0.9 % IV SOLN
INTRAVENOUS | Status: DC
  Administered 2011-10-14: 09:00:00 via INTRAVENOUS
  Administered 2011-10-15: 75 mL via INTRAVENOUS
  Administered 2011-10-15: via INTRAVENOUS
  Administered 2011-10-16: 1000 mL via INTRAVENOUS
  Administered 2011-10-17: 06:00:00 via INTRAVENOUS
  Administered 2011-10-17: 200 mL via INTRAVENOUS
  Administered 2011-10-19: 30 mL/h via INTRAVENOUS

## 2011-10-14 MED ORDER — LEVOFLOXACIN IN D5W 750 MG/150ML IV SOLN
750.0000 mg | INTRAVENOUS | Status: DC
  Administered 2011-10-14 – 2011-10-16 (×2): 750 mg via INTRAVENOUS
  Filled 2011-10-14 (×2): qty 150

## 2011-10-14 MED ORDER — HYOSCYAMINE SULFATE 0.125 MG SL SUBL
0.1250 mg | SUBLINGUAL_TABLET | Freq: Every day | SUBLINGUAL | Status: DC
  Administered 2011-10-14 – 2011-10-20 (×7): 0.125 mg via SUBLINGUAL
  Filled 2011-10-14 (×7): qty 1

## 2011-10-14 MED ORDER — MAGNESIUM SULFATE 40 MG/ML IJ SOLN
2.0000 g | Freq: Once | INTRAMUSCULAR | Status: AC
  Administered 2011-10-14: 2 g via INTRAVENOUS
  Filled 2011-10-14: qty 50

## 2011-10-14 NOTE — Progress Notes (Signed)
[facility] PROGRESS NOTE  [name] [name] ZOX:[id number] DOB: [birth year] DOA: 10/12/2011 PCP: [name], MD  Assessment/Plan: Principal Problem:  *SIRS (systemic inflammatory response syndrome) Active Problems:  HYPERTENSION  COPD  GERD  Pancolitis  Headache  Cardiac enzymes elevated  #1. SIRS secondary to pancolitis -  at this time and colitis concerning for C. difficile colitis. C. difficile PCR as been ordered and is pending.. Stool cultures have been ordered and are pending. Patient is currently afebrile. WBC is trending down. Will change IV Cipro to IV Levaquin to treat both of patient's pancolitis and right lung infiltrate. Continue IV Flagyl. Patient has been seen by gastroenterology and agree with current medical treatment. Follow. Appreciate GIs input and recommendations. Continue gentle hydration.  #2 pancolitis Patient with some clinical improvement. Abdominal pain is improving. Stool studies are pending. C. difficile PCR is pending. WBC is trending down. Change IV ciprofloxacin to IV Levaquin.. Continue IV Flagyl. GI is following I appreciate input and recommendations.   #3 right lung infiltrate. Chest x-ray Questionable etiology likely secondary to aspiration pneumonia as patient did have several episodes of emesis prior to admission. Patient denies any shortness of breath. Patient with no productive cough.Change IV ciprofloxacin to IV Levaquin. Patient will likely need a repeat chest x-ray done in about 4-6 weeks for resolution of infiltrate.  #3. Leukocytosis probably secondary #1 2 and 3, - blood cultures and urine culture been ordered.Stool cultures are pending. Leukocytosis is trending down. Continue empiric IV antibiotics.  Follow CBC trend.   #4. Headache probably precipitated by #1.  Fioricet when necessary  #5. Chronic pain on Dilaudid pump.  Continue pain regimen.  #6. COPD with no exacerbation -   stable continue home medications.    #7. Hypertension - continue home medications.   #8. History of CHF/cor pulmonale -  presently not short of breath and does not look decompensated. Monitor closely with hydration.  #9 elevated cardiac enzymes First set of cardiac enzymes were elevated however serial enzymes have been negative. 2-D echo with a normal EF with no wall motion abnormalities. Patient is currently asymptomatic. Likely secondary to current infection. No further cardiac workup is needed at this time. We'll follow.  #10 prophylaxis Pepcid for GI prophylaxis. SCDs for DVT prophylaxis.   Code Status: Full Family Communication: Updated patient. No family at bedside. Disposition Plan: Home versus SNF when medically stable.   Brief narrative: 78 year-old female with history of COPD on home oxygen, hypertension, history of pancreatitis and had undergone ERCP with sphincterotomy and subsequently cholecystectomy presents with complaints of abdominal pain. The abdominal pain started off early in the morning. The pain has been more in the epigastric area episodic crampy associated nausea no vomiting denies any diarrhea. Has subjective feeling of fever chills. Patient's records show patient has been on antibiotics last month for bronchitis. In addition patient also is complaining of frontal headache which started off same time. Patient denies any blurred vision or focal deficits oral neck pain. In the ER patient was found to be tachycardic with leukocytosis. Patient's CT abdomen pelvis shows diffuse pancolitis. Lactic acid levels were normal. Patient is admitted for further management.    Consultants:  GI Dr. Evette [name] 10/13/2011  Procedures:  2-D echo 10/13/2011  Acute abdominal series 10/12/2011  CT of the abdomen and pelvis 10/12/2011  Antibiotics:  IV ciprofloxacin 10/13/2011 to 10/14/2011  IV Flagyl 10/13/2011  IV Levaquin 10/14/2011  HPI/Subjective: Patient complaining of a headache. Patient states  abdominal pain is  improved. Patient tolerating clear liquids.  Objective: Filed Vitals:   10/14/11 0500 10/14/11 0600 10/14/11 0652 10/14/11 0800  BP:  86/43 97/48   Pulse:  71 72   Temp:    97.7 F (36.5 C)  TempSrc:    Oral  Resp:  9 10   Height:      Weight: 57.1 kg (125 lb 14.1 oz)     SpO2:  100% 100%     Intake/Output Summary (Last 24 hours) at 10/14/11 0931 Last data filed at 10/14/11 0700  Gross per 24 hour  Intake   2902 ml  Output   1300 ml  Net   1602 ml   Filed Weights   10/13/11 0307 10/13/11 0325 10/14/11 0500  Weight: 55.3 kg (121 lb 14.6 oz) 54 kg (119 lb 0.8 oz) 57.1 kg (125 lb 14.1 oz)    Exam:   General:  NAD  Cardiovascular: RRR  Respiratory: Some scattered coarse BS  Abdomen: Soft/NT/ND/+BS  Data Reviewed: Basic Metabolic Panel:  Lab 10/14/11 1610 10/13/11 0425 10/12/11 1859  NA 138 135 137  K 4.2 4.0 3.4*  CL 105 98 95*  CO2 29 28 30   GLUCOSE 137* 156* 195*  BUN 10 16 14   CREATININE 0.72 0.69 0.69  CALCIUM 7.4* 8.0* 9.6  MG 1.6 -- --  PHOS -- -- --   Liver Function Tests:  Lab 10/14/11 0325 10/13/11 0425 10/12/11 1859  AST 13 17 22   ALT 7 9 12   ALKPHOS 70 115 137*  BILITOT 0.2* 0.2* 0.3  PROT 4.6* 5.6* 6.9  ALBUMIN 2.4* 2.7* 3.7    Lab 10/13/11 0425  LIPASE 9*  AMYLASE --   No results found for this basename: AMMONIA:5 in the last 168 hours CBC:  Lab 10/14/11 0325 10/13/11 0425 10/12/11 1859  WBC 6.6 15.5* 23.7*  NEUTROABS 5.2 13.9* 22.1*  HGB 10.7* 14.6 14.7  HCT 32.6* 44.2 44.5  MCV 95.9 93.2 92.7  PLT 121* 150 185   Cardiac Enzymes:  Lab 10/13/11 1621 10/13/11 1040 10/13/11 0425  CKTOTAL 40 47 --  CKMB 3.9 4.6* --  CKMBINDEX -- -- --  TROPONINI <0.30 <0.30 0.96*   BNP (last 3 results) No results found for this basename: PROBNP:3 in the last 8760 hours CBG: No results found for this basename: GLUCAP:5 in the last 168 hours  Recent Results (from the past 240 hour(s))  CULTURE, BLOOD (ROUTINE X 2)      Status: Normal (Preliminary result)   Collection Time   10/13/11 12:00 AM      Component Value Range Status Comment   Specimen Description BLOOD RIGHT ANTECUBITAL   Final    Special Requests BOTTLES DRAWN AEROBIC AND ANAEROBIC 5 CC EACH   Final    Culture  Setup Time 10/13/2011 14:16   Final    Culture     Final    Value:        BLOOD CULTURE RECEIVED NO GROWTH TO DATE CULTURE WILL BE HELD FOR 5 DAYS BEFORE ISSUING A FINAL NEGATIVE REPORT   Report Status PENDING   Incomplete   CULTURE, BLOOD (ROUTINE X 2)     Status: Normal (Preliminary result)   Collection Time   10/13/11 12:10 AM      Component Value Range Status Comment   Specimen Description BLOOD LEFT HAND   Final    Special Requests BOTTLES DRAWN AEROBIC ONLY 3 CC   Final    Culture  Setup Time 10/13/2011 14:16  Final    Culture     Final    Value:        BLOOD CULTURE RECEIVED NO GROWTH TO DATE CULTURE WILL BE HELD FOR 5 DAYS BEFORE ISSUING A FINAL NEGATIVE REPORT   Report Status PENDING   Incomplete   MRSA PCR SCREENING     Status: Normal   Collection Time   10/13/11  3:28 AM      Component Value Range Status Comment   MRSA by PCR NEGATIVE  NEGATIVE Final      Studies: Ct Head Wo Contrast  10/12/2011  *RADIOLOGY REPORT*  Clinical Data: Abdominal pain with nausea, emesis, and headache  CT HEAD WITHOUT CONTRAST  Technique:  Contiguous axial images were obtained from the base of the skull through the vertex without contrast.  Comparison: 12/02/2009  Findings: There is no evidence for acute infarction, intracranial hemorrhage, mass lesion, hydrocephalus, or extra-axial fluid. Slight atrophy.  Mild chronic microvascular ischemic change. Intact calvarium.  Clear sinuses and mastoids.  Bilateral cataract extraction.  IMPRESSION: Mild atrophy with chronic microvascular ischemic change.  No acute stroke or hemorrhage.   Original Report Authenticated By: Elsie Stain, M.D.    Ct Abdomen Pelvis W Contrast  10/12/2011  *RADIOLOGY REPORT*   Clinical Data: Abdominal pain.  Nausea and vomiting.  CT ABDOMEN AND PELVIS WITH CONTRAST  Technique:  Multidetector CT imaging of the abdomen and pelvis was performed following the standard protocol during bolus administration of intravenous contrast.  Contrast: OMNIPAQUE IOHEXOL 300 MG/ML  SOLN  Comparison: Chest and two views abdomen earlier this same date.  CT abdomen and pelvis 03/02/2010.  Findings: There is no pleural or pericardial effusion.  The lung bases demonstrate some dependent atelectasis.  Pneumobilia is identified as on the prior examination.  The patient is status post cholecystectomy.  The spleen, adrenal glands, kidneys and pancreas appear normal.  There is diffuse wall thickening of the colon.  No pneumatosis, portal venous gas or free intraperitoneal air is identified.  A small amount of free pelvic fluid is seen.  The urinary bladder is incompletely distended.  The walls appears hyperenhancing.  No lymphadenopathy is identified.  The patient has severe convex right scoliosis.  Spinal stimulator device is noted.  IMPRESSION:  1.  Pancolitis. 2.  Incompletely distended urinary bladder demonstrates an enhancing wall and possible wall thickening worrisome for cystitis. 3.  Unchanged pneumobilia is consistent with prior sphincterotomy.   Original Report Authenticated By: Bernadene Bell. D'ALESSIO, M.D.    Dg Abd Acute W/chest  10/12/2011  *RADIOLOGY REPORT*  Clinical Data: Abdominal pain with vomiting and diarrhea.  ACUTE ABDOMEN SERIES (ABDOMEN 2 VIEW & CHEST 1 VIEW)  Comparison: Chest radiographs 11/12/2010 an acute abdominal series 03/01/2010.  Findings: The heart size is stable.  There is new right hilar fullness which may reflect a perihilar infiltrate or ill-defined mass.  The left lung is clear.  There is no significant pleural effusion.  A thoracic spinal stimulator is noted.  The abdomen is nearly gasless.  There is no free intraperitoneal air or evidence of significantly distended  bowel.  The patient has a severe convex right scoliosis with associated spondylosis. Cholecystectomy clips are noted.  An intrathecal spinal pump and its catheter appear intact.  IMPRESSION:  1.  New right perihilar infiltrate or ill-defined mass.  Correlate clinically.  This needs radiographic followup to exclude neoplasm. 2.  Nearly gasless abdomen without evidence of obstruction or free air.   Original Report Authenticated By:  Gerrianne Scale, M.D.     Scheduled Meds:   . sodium chloride   Intravenous STAT  . budesonide-formoterol  2 puff Inhalation BID  . ciprofloxacin  400 mg Intravenous Q12H  . famotidine  20 mg Oral Daily  . fluticasone  2 spray Each Nare Daily  . gabapentin  100 mg Oral TID  . hyoscyamine  0.125 mg Sublingual Daily  . magnesium sulfate 1 - 4 g bolus IVPB  2 g Intravenous Once  . metronidazole  500 mg Intravenous Q8H  . nebivolol  5 mg Oral Daily  . sodium chloride  3 mL Intravenous Q12H  . tiotropium  18 mcg Inhalation Daily   Continuous Infusions:   . sodium chloride 75 mL/hr at 10/14/11 0840  . DISCONTD: 0.9 % NaCl with KCl 20 mEq / L 100 mL/hr (10/14/11 0507)    Principal Problem:  *SIRS (systemic inflammatory response syndrome) Active Problems:  HYPERTENSION  COPD  GERD  Pancolitis  Headache  Cardiac enzymes elevated    Time spent: > 30 mins    [facility]  [facility] Pager [phone]. If 8PM-8AM, please contact night-coverage at [email or web address], password [facility] 10/14/2011, 9:31 AM  LOS: 2 days

## 2011-10-14 NOTE — Progress Notes (Signed)
CARE MANAGEMENT NOTE 10/14/2011  Patient:  SYA, NESTLER   Account Number:  [id number]  Date Initiated:  10/14/2011  Documentation initiated by:  DAVIS,RHONDA  Subjective/Objective Assessment:   pt with suspected sepsis     Action/Plan:   from home   Anticipated DC Date:  10/17/2011   Anticipated DC Plan:  HOME/SELF CARE  In-house referral  NA      DC Planning Services  NA      [facility] Choice  NA   Choice offered to / List presented to:  NA   DME arranged  NA      DME agency  NA     HH arranged  NA      HH agency  NA   Status of service:  In process, will continue to follow Medicare Important Message given?  NA - LOS <3 / Initial given by admissions (If response is "NO", the following Medicare IM given date fields will be blank) Date Medicare IM given:   Date Additional Medicare IM given:    Discharge Disposition:    Per UR Regulation:  Reviewed for med. necessity/level of care/duration of stay  If discussed at Long Length of Stay Meetings, dates discussed:    Comments:  [id number] Earlene Plater, RN, BSN, CCM: CHART REVIEWED AND UPDATED. NO DISCHARGE NEEDS PRESENT AT THIS TIME. CASE MANAGEMENT [id number]

## 2011-10-14 NOTE — Progress Notes (Signed)
INITIAL ADULT NUTRITION ASSESSMENT Date: 10/14/2011   Time: 10:05 AM Reason for Assessment: nutrition risk-weight loss with decreased po intake  ASSESSMENT: Female 78 y.o.  Dx: SIRS (systemic inflammatory response syndrome) secondary to pancolitis  Hx:  Past Medical History  Diagnosis Date  . Osteoporosis   . Arthritis     osteoarthritis  . GERD (gastroesophageal reflux disease)   . Hiatal hernia   . COPD (chronic obstructive pulmonary disease)   . Pneumonia   . Hypotension   . PUD (peptic ulcer disease)   . Cardiac arrhythmia   . Hyperlipidemia   . Heart failure   . Hypertension   . Scoliosis deformity of spine     history of intractable back pain  . Choledocholithiasis   . Pancolitis     Infectious vs. inflammatory  . Abdominal pain     Resolved  . Tachycardia   . Chronic anemia   . Fecal impaction   . Chronic back pain   . Pancreatitis     History of  . CHF (congestive heart failure)    Past Surgical History  Procedure Date  . Cholecystectomy 2008  . Abdominal hysterectomy 1970s  . Spine surgery [phone]  . Neck surgery 1970s  . Spinal cord stimulator implant   . Ercp w/ sphicterotomy 02/2010  . Back surgery     x 5    Related Meds:     . sodium chloride   Intravenous STAT  . budesonide-formoterol  2 puff Inhalation BID  . famotidine  20 mg Oral Daily  . fluticasone  2 spray Each Nare Daily  . gabapentin  100 mg Oral TID  . hyoscyamine  0.125 mg Sublingual Daily  . levofloxacin (LEVAQUIN) IV  750 mg Intravenous Q48H  . magnesium sulfate 1 - 4 g bolus IVPB  2 g Intravenous Once  . metronidazole  500 mg Intravenous Q8H  . nebivolol  5 mg Oral Daily  . sodium chloride  3 mL Intravenous Q12H  . tiotropium  18 mcg Inhalation Daily  . DISCONTD: ciprofloxacin  400 mg Intravenous Q12H     Ht: 5' (152.4 cm)  Wt: 125 lb 14.1 oz (57.1 kg)  Ideal Wt: 45.5 kg  % Ideal Wt: 125  Usual Wt: 119# per pt Wt Readings from Last 10 Encounters:    10/14/11 125 lb 14.1 oz (57.1 kg)  09/02/11 122 lb (55.339 kg)  07/15/11 121 lb (54.885 kg)  05/09/11 126 lb 9.6 oz (57.425 kg)  03/18/11 128 lb (58.06 kg)  03/06/11 127 lb (57.607 kg)  12/05/10 130 lb (58.968 kg)  11/12/10 128 lb (58.06 kg)  11/11/10 128 lb (58.06 kg)  08/30/09 125 lb (56.7 kg)   % Usual Wt: 105  Body mass index is 24.58 kg/(m^2).    Labs:  CMP     Component Value Date/Time   NA 138 10/14/2011 0325   K 4.2 10/14/2011 0325   CL 105 10/14/2011 0325   CO2 29 10/14/2011 0325   GLUCOSE 137* 10/14/2011 0325   BUN 10 10/14/2011 0325   CREATININE 0.72 10/14/2011 0325   CREATININE 0.80 03/18/2011 1428   CALCIUM 7.4* 10/14/2011 0325   PROT 4.6* 10/14/2011 0325   ALBUMIN 2.4* 10/14/2011 0325   AST 13 10/14/2011 0325   ALT 7 10/14/2011 0325   ALKPHOS 70 10/14/2011 0325   BILITOT 0.2* 10/14/2011 0325   GFRNONAA 77* 10/14/2011 0325   GFRAA 89* 10/14/2011 0325    I/O last 3 completed shifts: In: 3402 [  I.V.:2700; IV Piggyback:702] Out: 1300 [Urine:1300] Total I/O In: 175 [I.V.:75; IV Piggyback:100] Out: 800 [Urine:800]   Diet Order: Full Liquid  Supplements/Tube Feeding:  none  IVF:    sodium chloride Last Rate: 75 mL/hr at 10/14/11 0840  DISCONTD: 0.9 % NaCl with KCl 20 mEq / L Last Rate: 100 mL/hr (10/14/11 0507)    Estimated Nutritional Needs:   Kcal: 1300-1400 Protein: 55-65 gm  Fluid: >1.4L  Food/Nutrition Related Hx: Pt reports regular diet and eating fairly well prior to admit.  Pt reports no recent weight loss.  Tolerating clear liquid diet.  Diet increased to full liquids. Pt does not want supplements at this time.  NUTRITION DIAGNOSIS: -Predicted suboptimal energy intake (NI-1.6).  Status: Ongoing  RELATED TO: gi issues  AS EVIDENCE BY: decreased intake observed today  MONITORING/EVALUATION(Goals): Intake of >75% meals. Monitor:  Diet, intake, labs, weight  EDUCATION NEEDS: -Education not appropriate at this time  INTERVENTION: Monitor pt  status and need for supplements  Dietitian #:Krystal Teachey Derrell Lolling, RD, LDN Clinical Inpatient Dietitian Pager:  [phone] Weekend and after hours pager:  [phone]    DOCUMENTATION CODES Per approved criteria  -Not Applicable    Jeoffrey Massed 10/14/2011, 10:05 AM

## 2011-10-14 NOTE — Progress Notes (Signed)
[facility] Progress Note  Subjective: Feels better. Had some brown stool today per RN. No bleeding.   Objective: Vital signs in last 24 hours: Temp:  [97.4 F (36.3 C)-98.7 F (37.1 C)] 97.7 F (36.5 C) (09/17 0800) Pulse Rate:  [66-94] 94  (09/17 0900) Resp:  [8-20] 16  (09/17 0900) BP: (79-129)/(37-70) 129/61 mmHg (09/17 0835) SpO2:  [97 %-100 %] 100 % (09/17 0900) Weight:  [57.1 kg (125 lb 14.1 oz)] 57.1 kg (125 lb 14.1 oz) (09/17 0500) Weight change: 1.8 kg (3 lb 15.5 oz)   PE: Abdomen is soft and non tender.  Lab Results: Results for orders placed during the hospital encounter of 10/12/11 (from the past 24 hour(s))  URINALYSIS, ROUTINE W REFLEX MICROSCOPIC     Status: Abnormal   Collection Time   10/13/11 11:26 AM      Component Value Range   Color, Urine YELLOW  YELLOW   APPearance CLEAR  CLEAR   Specific Gravity, Urine >1.046 (*) 1.005 - 1.030   pH 6.0  5.0 - 8.0   Glucose, UA NEGATIVE  NEGATIVE mg/dL   Hgb urine dipstick SMALL (*) NEGATIVE   Bilirubin Urine NEGATIVE  NEGATIVE   Ketones, ur NEGATIVE  NEGATIVE mg/dL   Protein, ur NEGATIVE  NEGATIVE mg/dL   Urobilinogen, UA 0.2  0.0 - 1.0 mg/dL   Nitrite NEGATIVE  NEGATIVE   Leukocytes, UA NEGATIVE  NEGATIVE  URINE MICROSCOPIC-ADD ON     Status: Abnormal   Collection Time   10/13/11 11:26 AM      Component Value Range   Squamous Epithelial / LPF RARE  RARE   RBC / HPF 3-6  <3 RBC/hpf   Bacteria, UA FEW (*) RARE   Urine-Other MUCOUS PRESENT    TROPONIN I     Status: Normal   Collection Time   10/13/11  4:21 PM      Component Value Range   Troponin I <0.30  <0.30 ng/mL  CK TOTAL AND CKMB     Status: Normal   Collection Time   10/13/11  4:21 PM      Component Value Range   Total CK 40  7 - 177 U/L   CK, MB 3.9  0.3 - 4.0 ng/mL   Relative Index RELATIVE INDEX IS INVALID  0.0 - 2.5  COMPREHENSIVE METABOLIC PANEL     Status: Abnormal   Collection Time   10/14/11  3:25 AM      Component Value Range     Sodium 138  135 - 145 mEq/L   Potassium 4.2  3.5 - 5.1 mEq/L   Chloride 105  96 - 112 mEq/L   CO2 29  19 - 32 mEq/L   Glucose, Bld 137 (*) 70 - 99 mg/dL   BUN 10  6 - 23 mg/dL   Creatinine, Ser 1.61  0.50 - 1.10 mg/dL   Calcium 7.4 (*) 8.4 - 10.5 mg/dL   Total Protein 4.6 (*) 6.0 - 8.3 g/dL   Albumin 2.4 (*) 3.5 - 5.2 g/dL   AST 13  0 - 37 U/L   ALT 7  0 - 35 U/L   Alkaline Phosphatase 70  39 - 117 U/L   Total Bilirubin 0.2 (*) 0.3 - 1.2 mg/dL   GFR calc non Af Amer 77 (*) >90 mL/min   GFR calc Af Amer 89 (*) >90 mL/min  CBC WITH DIFFERENTIAL     Status: Abnormal   Collection Time   10/14/11  3:25  AM      Component Value Range   WBC 6.6  4.0 - 10.5 K/uL   RBC 3.45 (*) 3.87 - 5.11 MIL/uL   Hemoglobin 10.7 (*) 12.0 - 15.0 g/dL   HCT 84.6 (*) 96.2 - 95.2 %   MCV 95.9  78.0 - 100.0 fL   MCH 30.7  26.0 - 34.0 pg   MCHC 32.0  30.0 - 36.0 g/dL   RDW 84.1  32.4 - 40.1 %   Platelets 121 (*) 150 - 400 K/uL   Neutrophils Relative 79 (*) 43 - 77 %   Neutro Abs 5.2  1.7 - 7.7 K/uL   Lymphocytes Relative 12  12 - 46 %   Lymphs Abs 0.8  0.7 - 4.0 K/uL   Monocytes Relative 8  3 - 12 %   Monocytes Absolute 0.5  0.1 - 1.0 K/uL   Eosinophils Relative 1  0 - 5 %   Eosinophils Absolute 0.0  0.0 - 0.7 K/uL   Basophils Relative 0  0 - 1 %   Basophils Absolute 0.0  0.0 - 0.1 K/uL  MAGNESIUM     Status: Normal   Collection Time   10/14/11  3:25 AM      Component Value Range   Magnesium 1.6  1.5 - 2.5 mg/dL    Studies/Results: @RISRSLT24 @    Assessment: Pancolitis. Rule out infectious (stool being checked). ? Ischemic.  Plan: Continue supportive care.    [name] [name] F 10/14/2011, 11:24 AM

## 2011-10-14 NOTE — Progress Notes (Signed)
ANTIBIOTIC CONSULT NOTE - INITIAL  Pharmacy Consult for Levaquin Indication: PNA, Colitis  Allergies  Allergen Reactions  . Codeine     REACTION: n/v/d, HA  . Morphine     REACTION: n/v/d, HA  . Sulfa Antibiotics   . Zoledronic Acid     REACTION: Severe edema   Patient Measurements: Height: 5' (152.4 cm) Weight: 125 lb 14.1 oz (57.1 kg) IBW/kg (Calculated) : 45.5   Vital Signs: Temp: 97.8 F (36.6 C) (09/17 1200) Temp src: Oral (09/17 1200) BP: 106/56 mmHg (09/17 1030) Pulse Rate: 77  (09/17 1100) Intake/Output from previous day: 09/16 0701 - 09/17 0700 In: 2902 [I.V.:2400; IV Piggyback:502] Out: 1300 [Urine:1300] Intake/Output from this shift: Total I/O In: 375 [I.V.:225; IV Piggyback:150] Out: 800 [Urine:800]  Labs:  [facility] 10/14/11 0325 10/13/11 0425 10/12/11 1859  WBC 6.6 15.5* 23.7*  HGB 10.7* 14.6 14.7  PLT 121* 150 185  LABCREA -- -- --  CREATININE 0.72 0.69 0.69   Estimated Creatinine Clearance: 41.4 ml/min (by C-G formula based on Cr of 0.72). No results found for this basename: VANCOTROUGH:2,VANCOPEAK:2,VANCORANDOM:2,GENTTROUGH:2,GENTPEAK:2,GENTRANDOM:2,TOBRATROUGH:2,TOBRAPEAK:2,TOBRARND:2,AMIKACINPEAK:2,AMIKACINTROU:2,AMIKACIN:2, in the last 72 hours   Assessment:  72 yof w/hx COPD, HTN, pancreatitis, h/o ERCP and cholecystecomy admitted with abd pain. Patient was started on Cipro and Flagyl for pancolitis/SIRS on 9/15. MD d/c'd cipro and start Levaquin per MD this AM for additional lung coverage (R lung infiltrate)  Afeb, WBC wnl, Scr stable  Pending blood and urine cultures.  C.diff and mrsa pcr both negative  Plan:   Levaquin 750 mg IV q48h  Pharmacy will f/u  Geoffry Paradise Thi 10/14/2011,1:29 PM

## 2011-10-15 LAB — CBC WITH DIFFERENTIAL/PLATELET
Basophils Absolute: 0 10*3/uL (ref 0.0–0.1)
Basophils Relative: 0 % (ref 0–1)
Eosinophils Absolute: 0.1 10*3/uL (ref 0.0–0.7)
Eosinophils Relative: 2 % (ref 0–5)
HCT: 33.2 % — ABNORMAL LOW (ref 36.0–46.0)
Hemoglobin: 10.6 g/dL — ABNORMAL LOW (ref 12.0–15.0)
Lymphocytes Relative: 16 % (ref 12–46)
Lymphs Abs: 0.8 10*3/uL (ref 0.7–4.0)
MCH: 30.8 pg (ref 26.0–34.0)
MCHC: 31.9 g/dL (ref 30.0–36.0)
MCV: 96.5 fL (ref 78.0–100.0)
Monocytes Absolute: 0.5 10*3/uL (ref 0.1–1.0)
Monocytes Relative: 11 % (ref 3–12)
Neutro Abs: 3.4 10*3/uL (ref 1.7–7.7)
Neutrophils Relative %: 71 % (ref 43–77)
Platelets: 123 10*3/uL — ABNORMAL LOW (ref 150–400)
RBC: 3.44 MIL/uL — ABNORMAL LOW (ref 3.87–5.11)
RDW: 14 % (ref 11.5–15.5)
WBC: 4.7 10*3/uL (ref 4.0–10.5)

## 2011-10-15 LAB — BASIC METABOLIC PANEL
BUN: 4 mg/dL — ABNORMAL LOW (ref 6–23)
CO2: 26 mEq/L (ref 19–32)
Calcium: 7.5 mg/dL — ABNORMAL LOW (ref 8.4–10.5)
Chloride: 105 mEq/L (ref 96–112)
Creatinine, Ser: 0.64 mg/dL (ref 0.50–1.10)
GFR calc Af Amer: >90 mL/min (ref >90)
GFR calc non Af Amer: 80 mL/min — ABNORMAL LOW (ref >90)
Glucose, Bld: 94 mg/dL (ref 70–99)
Potassium: 3.8 mEq/L (ref 3.5–5.1)
Sodium: 139 mEq/L (ref 135–145)

## 2011-10-15 MED ORDER — NEBIVOLOL HCL 2.5 MG PO TABS
2.5000 mg | ORAL_TABLET | Freq: Every day | ORAL | Status: DC
  Administered 2011-10-16 – 2011-10-20 (×2): 2.5 mg via ORAL
  Filled 2011-10-15 (×5): qty 1

## 2011-10-15 MED ORDER — HYDROCOD POLST-CHLORPHEN POLST 10-8 MG/5ML PO LQCR
5.0000 mL | Freq: Two times a day (BID) | ORAL | Status: DC | PRN
  Filled 2011-10-15: qty 5

## 2011-10-15 MED ORDER — HYDROCOD POLST-CHLORPHEN POLST 10-8 MG/5ML PO LQCR
5.0000 mL | Freq: Once | ORAL | Status: AC
  Administered 2011-10-15: 5 mL via ORAL

## 2011-10-15 NOTE — Progress Notes (Signed)
Spoke with the family of patient, daughter works over at [employer] in [place].  States that the patient uses [facility] may need RN,PT,OT and aide

## 2011-10-15 NOTE — Progress Notes (Signed)
[facility] Progress Note  Subjective: The patient is feeling well today and denies abdominal pain.  Objective: Vital signs in last 24 hours: Temp:  [97.6 F (36.4 C)-98.2 F (36.8 C)] 97.6 F (36.4 C) (09/18 0800) Pulse Rate:  [50-85] 50  (09/18 0800) Resp:  [9-19] 19  (09/18 0800) BP: (89-112)/(42-87) 103/87 mmHg (09/18 0800) SpO2:  [91 %-100 %] 91 % (09/18 0800) Weight change:    PE: Abdomen is soft and nontender  Lab Results: Results for orders placed during the hospital encounter of 10/12/11 (from the past 24 hour(s))  BASIC METABOLIC PANEL     Status: Abnormal   Collection Time   10/15/11  4:02 AM      Component Value Range   Sodium 139  135 - 145 mEq/L   Potassium 3.8  3.5 - 5.1 mEq/L   Chloride 105  96 - 112 mEq/L   CO2 26  19 - 32 mEq/L   Glucose, Bld 94  70 - 99 mg/dL   BUN 4 (*) 6 - 23 mg/dL   Creatinine, Ser 4.78  0.50 - 1.10 mg/dL   Calcium 7.5 (*) 8.4 - 10.5 mg/dL   GFR calc non Af Amer 80 (*) >90 mL/min   GFR calc Af Amer >90  >90 mL/min  CBC WITH DIFFERENTIAL     Status: Abnormal   Collection Time   10/15/11  4:02 AM      Component Value Range   WBC 4.7  4.0 - 10.5 K/uL   RBC 3.44 (*) 3.87 - 5.11 MIL/uL   Hemoglobin 10.6 (*) 12.0 - 15.0 g/dL   HCT 29.5 (*) 62.1 - 30.8 %   MCV 96.5  78.0 - 100.0 fL   MCH 30.8  26.0 - 34.0 pg   MCHC 31.9  30.0 - 36.0 g/dL   RDW 65.7  84.6 - 96.2 %   Platelets 123 (*) 150 - 400 K/uL   Neutrophils Relative 71  43 - 77 %   Neutro Abs 3.4  1.7 - 7.7 K/uL   Lymphocytes Relative 16  12 - 46 %   Lymphs Abs 0.8  0.7 - 4.0 K/uL   Monocytes Relative 11  3 - 12 %   Monocytes Absolute 0.5  0.1 - 1.0 K/uL   Eosinophils Relative 2  0 - 5 %   Eosinophils Absolute 0.1  0.0 - 0.7 K/uL   Basophils Relative 0  0 - 1 %   Basophils Absolute 0.0  0.0 - 0.1 K/uL    Studies/Results: @RISRSLT24 @    Assessment: Pancolitis, infectious versus ischemic in nature. Patient is clinically improved. She is hungry and would  like to keep. White blood cell count is normal. H&H is stable.  Plan: [facility].    Graylin Shiver 10/15/2011, 12:21 PM

## 2011-10-15 NOTE — Progress Notes (Signed)
[facility] PROGRESS NOTE  [name] [name]:[id number] DOB: Jun 14, 1925 DOA: 10/12/2011 PCP: [name], MD  Assessment/Plan: Principal Problem:  *SIRS (systemic inflammatory response syndrome) Active Problems:  HYPERTENSION  COPD  GERD  Pancolitis  Headache  Cardiac enzymes elevated  Pulmonary infiltrate in right lung on CXR  #1. SIRS secondary to pancolitis -  -C. difficile PCR negative. Stool cultures pending. Patient is currently afebrile. No leukocytosis.  -continue IV Levaquin to treat both of patient's pancolitis and right lung infiltrate. Continue IV Flagyl. -Still with hypotension last p.m., continue hydration with IV fluids  -GI following, Appreciate input and recommendations.   #2 pancolitis -As above, GI following  #3 right lung infiltrate. Chest x-ray Questionable etiology likely secondary to aspiration pneumonia as patient did have several episodes of emesis prior to admission. Patient denies any shortness of breath. Patient with no productive cough. on IV Levaquin as above. Patient will likely need a repeat chest x-ray done in about 4-6 weeks for resolution of infiltrate.  #3. Leukocytosis probably secondary #1 2 and 3, -resolved with treatment as above  #4. Headache probably precipitated by #1.  Continue Fioricet when necessary  #5. Chronic pain on Dilaudid pump.  Continue pain regimen.  #6. COPD with no exacerbation -   stable continue home medications.   #7.h/o Hypertension - Hold parameters added to home medications due to hypotension last pm as above.   #8. History of CHF/cor pulmonale -  presently not short of breath and does not look decompensated. Continue to Monitor closely with hydration.  #9 elevated cardiac enzymes First set of cardiac enzymes were elevated however serial enzymes have been negative. 2-D echo with a normal EF with no wall motion abnormalities. Likely secondary to current infection. No further cardiac  workup is needed at this time.  -Patient remains asymptomatic. #10 prophylaxis Pepcid for GI prophylaxis. SCDs for DVT prophylaxis.   Code Status: Full Family Communication: Updated patient. No family at bedside. Disposition Plan: Home versus SNF when medically stable.   Brief narrative: 78 year-old female with history of COPD on home oxygen, hypertension, history of pancreatitis and had undergone ERCP with sphincterotomy and subsequently cholecystectomy presents with complaints of abdominal pain. The abdominal pain started off early in the morning. The pain has been more in the epigastric area episodic crampy associated nausea no vomiting denies any diarrhea. Has subjective feeling of fever chills. Patient's records show patient has been on antibiotics last month for bronchitis. In addition patient also is complaining of frontal headache which started off same time. Patient denies any blurred vision or focal deficits oral neck pain. In the ER patient was found to be tachycardic with leukocytosis. Patient's CT abdomen pelvis shows diffuse pancolitis. Lactic acid levels were normal. Patient was admitted for further management.    Consultants:  GI Dr. Evette [name] 10/13/2011  Procedures:  2-D echo 10/13/2011  Acute abdominal series 10/12/2011  CT of the abdomen and pelvis 10/12/2011  Antibiotics:  IV ciprofloxacin 10/13/2011 to 10/14/2011  IV Flagyl 10/13/2011  IV Levaquin 10/14/2011  HPI/Subjective: States she had a headache earlier this a.m., denies nausea vomiting, decreased abdominal pain  Objective: Filed Vitals:   10/15/11 0200 10/15/11 0400 10/15/11 0600 10/15/11 0800  BP: 89/46 99/55 102/45 103/87  Pulse: 66 65 72 50  Temp:  98 F (36.7 C)  97.6 F (36.4 C)  TempSrc:  Axillary  Oral  Resp: 11 12 10 19   Height:      Weight:  SpO2: 100% 100% 97% 91%    Intake/Output Summary (Last 24 hours) at 10/15/11 1033 Last data filed at 10/15/11 0900  Gross per 24 hour    Intake   2150 ml  Output    700 ml  Net   1450 ml   Filed Weights   10/13/11 0307 10/13/11 0325 10/14/11 0500  Weight: 55.3 kg (121 lb 14.6 oz) 54 kg (119 lb 0.8 oz) 57.1 kg (125 lb 14.1 oz)    Exam:   General:  NAD  Cardiovascular: RRR  Respiratory: Decreased breath sounds at the bases, otherwise clear  Abdomen: Soft/NT/ND/+BS  Data Reviewed: Basic Metabolic Panel:  Lab 10/15/11 0981 10/14/11 0325 10/13/11 0425 10/12/11 1859  NA 139 138 135 137  K 3.8 4.2 4.0 3.4*  CL 105 105 98 95*  CO2 26 29 28 30   GLUCOSE 94 137* 156* 195*  BUN 4* 10 16 14   CREATININE 0.64 0.72 0.69 0.69  CALCIUM 7.5* 7.4* 8.0* 9.6  MG -- 1.6 -- --  PHOS -- -- -- --   Liver Function Tests:  Lab 10/14/11 0325 10/13/11 0425 10/12/11 1859  AST 13 17 22   ALT 7 9 12   ALKPHOS 70 115 137*  BILITOT 0.2* 0.2* 0.3  PROT 4.6* 5.6* 6.9  ALBUMIN 2.4* 2.7* 3.7    Lab 10/13/11 0425  LIPASE 9*  AMYLASE --   No results found for this basename: AMMONIA:5 in the last 168 hours CBC:  Lab 10/15/11 0402 10/14/11 0325 10/13/11 0425 10/12/11 1859  WBC 4.7 6.6 15.5* 23.7*  NEUTROABS 3.4 5.2 13.9* 22.1*  HGB 10.6* 10.7* 14.6 14.7  HCT 33.2* 32.6* 44.2 44.5  MCV 96.5 95.9 93.2 92.7  PLT 123* 121* 150 185   Cardiac Enzymes:  Lab 10/13/11 1621 10/13/11 1040 10/13/11 0425  CKTOTAL 40 47 --  CKMB 3.9 4.6* --  CKMBINDEX -- -- --  TROPONINI <0.30 <0.30 0.96*   BNP (last 3 results) No results found for this basename: PROBNP:3 in the last 8760 hours CBG: No results found for this basename: GLUCAP:5 in the last 168 hours  Recent Results (from the past 240 hour(s))  CULTURE, BLOOD (ROUTINE X 2)     Status: Normal (Preliminary result)   Collection Time   10/13/11 12:00 AM      Component Value Range Status Comment   Specimen Description BLOOD RIGHT ANTECUBITAL   Final    Special Requests BOTTLES DRAWN AEROBIC AND ANAEROBIC 5 CC EACH   Final    Culture  Setup Time 10/13/2011 14:16   Final    Culture      Final    Value:        BLOOD CULTURE RECEIVED NO GROWTH TO DATE CULTURE WILL BE HELD FOR 5 DAYS BEFORE ISSUING A FINAL NEGATIVE REPORT   Report Status PENDING   Incomplete   CULTURE, BLOOD (ROUTINE X 2)     Status: Normal (Preliminary result)   Collection Time   10/13/11 12:10 AM      Component Value Range Status Comment   Specimen Description BLOOD LEFT HAND   Final    Special Requests BOTTLES DRAWN AEROBIC ONLY 3 CC   Final    Culture  Setup Time 10/13/2011 14:16   Final    Culture     Final    Value:        BLOOD CULTURE RECEIVED NO GROWTH TO DATE CULTURE WILL BE HELD FOR 5 DAYS BEFORE ISSUING A FINAL NEGATIVE REPORT  Report Status PENDING   Incomplete   MRSA PCR SCREENING     Status: Normal   Collection Time   10/13/11  3:28 AM      Component Value Range Status Comment   MRSA by PCR NEGATIVE  NEGATIVE Final   URINE CULTURE     Status: Normal   Collection Time   10/13/11 11:26 AM      Component Value Range Status Comment   Specimen Description URINE, RANDOM   Final    Special Requests NONE   Final    Culture  Setup Time 10/14/2011 02:35   Final    Colony Count NO GROWTH   Final    Culture NO GROWTH   Final    Report Status 10/14/2011 FINAL   Final   CLOSTRIDIUM DIFFICILE BY PCR     Status: Normal   Collection Time   10/14/11  9:07 AM      Component Value Range Status Comment   C difficile by pcr NEGATIVE  NEGATIVE Final      Studies: No results found.  Scheduled Meds:    . budesonide-formoterol  2 puff Inhalation BID  . famotidine  20 mg Oral Daily  . fluticasone  2 spray Each Nare Daily  . gabapentin  100 mg Oral TID  . hyoscyamine  0.125 mg Sublingual Daily  . levofloxacin (LEVAQUIN) IV  750 mg Intravenous Q48H  . magnesium sulfate 1 - 4 g bolus IVPB  2 g Intravenous Once  . metronidazole  500 mg Intravenous Q8H  . nebivolol  5 mg Oral Daily  . sodium chloride  3 mL Intravenous Q12H  . tiotropium  18 mcg Inhalation Daily  . DISCONTD: hyoscyamine  0.125 mg  Sublingual Daily   Continuous Infusions:    . sodium chloride 75 mL (10/15/11 0639)    Principal Problem:  *SIRS (systemic inflammatory response syndrome) Active Problems:  HYPERTENSION  COPD  GERD  Pancolitis  Headache  Cardiac enzymes elevated  Pulmonary infiltrate in right lung on CXR    Time spent: >42mins    [facility] C  [facility] Pager [phone]. If 8PM-8AM, please contact night-coverage at [email or web address], password [facility] 10/15/2011, 10:33 AM  LOS: 3 days

## 2011-10-15 NOTE — Progress Notes (Signed)
Patient refused to take Bystolic5mg  and also noted that she normally took half tablet at home. Pharmacy was alerted and her dosing was adjusted accordingly. Continuing to attend monitor patient

## 2011-10-16 LAB — CBC
HCT: 30.5 % — ABNORMAL LOW (ref 36.0–46.0)
Hemoglobin: 9.7 g/dL — ABNORMAL LOW (ref 12.0–15.0)
MCH: 30.5 pg (ref 26.0–34.0)
MCHC: 31.8 g/dL (ref 30.0–36.0)
MCV: 95.9 fL (ref 78.0–100.0)
Platelets: 127 10*3/uL — ABNORMAL LOW (ref 150–400)
RBC: 3.18 MIL/uL — ABNORMAL LOW (ref 3.87–5.11)
RDW: 14 % (ref 11.5–15.5)
WBC: 4.4 10*3/uL (ref 4.0–10.5)

## 2011-10-16 LAB — BASIC METABOLIC PANEL
BUN: 4 mg/dL — ABNORMAL LOW (ref 6–23)
CO2: 30 mEq/L (ref 19–32)
Calcium: 7.6 mg/dL — ABNORMAL LOW (ref 8.4–10.5)
Chloride: 108 mEq/L (ref 96–112)
Creatinine, Ser: 0.58 mg/dL (ref 0.50–1.10)
GFR calc Af Amer: >90 mL/min (ref >90)
GFR calc non Af Amer: 82 mL/min — ABNORMAL LOW (ref >90)
Glucose, Bld: 108 mg/dL — ABNORMAL HIGH (ref 70–99)
Potassium: 3.9 mEq/L (ref 3.5–5.1)
Sodium: 141 mEq/L (ref 135–145)

## 2011-10-16 MED ORDER — METRONIDAZOLE 500 MG PO TABS
500.0000 mg | ORAL_TABLET | Freq: Three times a day (TID) | ORAL | Status: DC
  Administered 2011-10-16 – 2011-10-20 (×12): 500 mg via ORAL
  Filled 2011-10-16 (×15): qty 1

## 2011-10-16 MED ORDER — LEVOFLOXACIN 500 MG PO TABS
500.0000 mg | ORAL_TABLET | ORAL | Status: DC
  Administered 2011-10-18 – 2011-10-20 (×2): 500 mg via ORAL
  Filled 2011-10-16 (×2): qty 1

## 2011-10-16 MED ORDER — ENSURE COMPLETE PO LIQD
237.0000 mL | Freq: Two times a day (BID) | ORAL | Status: DC
  Administered 2011-10-16 – 2011-10-20 (×6): 237 mL via ORAL

## 2011-10-16 NOTE — Progress Notes (Signed)
[facility] PROGRESS NOTE  [name] [name] JXB:[id number] DOB: March 10, 1925 DOA: 10/12/2011 PCP: [name], MD  Assessment/Plan: Principal Problem:  *SIRS (systemic inflammatory response syndrome) Active Problems:  HYPERTENSION  COPD  GERD  Pancolitis  Headache  Cardiac enzymes elevated  Pulmonary infiltrate in right lung on CXR  #1. SIRS secondary to pancolitis -  -C. difficile PCR negative. Stool cultures pending. Patient is currently afebrile. No leukocytosis.  -Change to by mouth Levaquin to treat both of patient's pancolitis and right lung infiltrate. Change to by mouth Flagyl as per GI recommendation- total of 10 days of antibiotics recommended. -Patient tolerating regular diet -No further hypotensive episodes last p.m.  -GI following, Appreciate input and recommendations-patient to follow up in office with Dr. Ewing Schlein as scheduled  -Will consult PT OT  #2 pancolitis -As above, GI following  #3 right lung infiltrate. Chest x-ray Questionable etiology likely secondary to aspiration pneumonia as patient did have several episodes of emesis prior to admission. Patient denies any shortness of breath. Patient with no productive cough. Changing to oral Levaquin as above. Patient will likely need a repeat chest x-ray done in about 4-6 weeks for resolution of infiltrate.  #3. Leukocytosis probably secondary #1 2 and 3, -resolved with treatment as above  #4. Headache probably precipitated by #1.  Continue Fioricet when necessary  #5. Chronic pain on Dilaudid pump.  Continue pain regimen.  #6. COPD with no exacerbation -   stable continue home medications.   #7.h/o Hypertension - Hold parameters added to home medications due to hypotension last pm as above.   #8. History of CHF/cor pulmonale -  presently not short of breath and does not look decompensated. Continue to Monitor closely with hydration.  #9 elevated cardiac enzymes First set of cardiac enzymes  were elevated however serial enzymes have been negative. 2-D echo with a normal EF with no wall motion abnormalities. Likely secondary to current infection. No further cardiac workup is needed at this time.  -Patient remains asymptomatic. #10 prophylaxis Pepcid for GI prophylaxis. SCDs for DVT prophylaxis.   Code Status: Full Family Communication: Updated patient. No family at bedside. Disposition Plan: Home versus SNF when medically stable.   Brief narrative: 78 year-old female with history of COPD on home oxygen, hypertension, history of pancreatitis and had undergone ERCP with sphincterotomy and subsequently cholecystectomy presents with complaints of abdominal pain. The abdominal pain started off early in the morning. The pain has been more in the epigastric area episodic crampy associated nausea no vomiting denies any diarrhea. Has subjective feeling of fever chills. Patient's records show patient has been on antibiotics last month for bronchitis. In addition patient also is complaining of frontal headache which started off same time. Patient denies any blurred vision or focal deficits oral neck pain. In the ER patient was found to be tachycardic with leukocytosis. Patient's CT abdomen pelvis shows diffuse pancolitis. Lactic acid levels were normal. Patient was admitted for further management.    Consultants:  GI Dr. Evette [name] 10/13/2011  Procedures:  2-D echo 10/13/2011  Acute abdominal series 10/12/2011  CT of the abdomen and pelvis 10/12/2011  Antibiotics:  IV ciprofloxacin 10/13/2011 to 10/14/2011  IV Flagyl 10/13/2011  IV Levaquin 10/14/2011  HPI/Subjective: + headache earlier this a.m for now better, denies nausea vomiting complaining of mild pain across upper abdomen. Tolerating solids well.  Objective: Filed Vitals:   10/16/11 0400 10/16/11 0800 10/16/11 0852 10/16/11 0925  BP:    141/65  Pulse: 75  107  Temp: 97.8 F (36.6 C) 98.4 F (36.9 C)    TempSrc: Oral  Oral    Resp: 12   15  Height:      Weight:      SpO2: 100%  100% 99%    Intake/Output Summary (Last 24 hours) at 10/16/11 0948 Last data filed at 10/16/11 0900  Gross per 24 hour  Intake   2628 ml  Output    770 ml  Net   1858 ml   Filed Weights   10/13/11 0325 10/14/11 0500 10/16/11 0000  Weight: 54 kg (119 lb 0.8 oz) 57.1 kg (125 lb 14.1 oz) 59.6 kg (131 lb 6.3 oz)    Exam:   General:  NAD  Cardiovascular: RRR  Respiratory: Decreased breath sounds at the bases, otherwise clear  Abdomen: Soft/NT/ND/+BS  Data Reviewed: Basic Metabolic Panel:  Lab 10/16/11 1610 10/15/11 0402 10/14/11 0325 10/13/11 0425 10/12/11 1859  NA 141 139 138 135 137  K 3.9 3.8 4.2 4.0 3.4*  CL 108 105 105 98 95*  CO2 30 26 29 28 30   GLUCOSE 108* 94 137* 156* 195*  BUN 4* 4* 10 16 14   CREATININE 0.58 0.64 0.72 0.69 0.69  CALCIUM 7.6* 7.5* 7.4* 8.0* 9.6  MG -- -- 1.6 -- --  PHOS -- -- -- -- --   Liver Function Tests:  Lab 10/14/11 0325 10/13/11 0425 10/12/11 1859  AST 13 17 22   ALT 7 9 12   ALKPHOS 70 115 137*  BILITOT 0.2* 0.2* 0.3  PROT 4.6* 5.6* 6.9  ALBUMIN 2.4* 2.7* 3.7    Lab 10/13/11 0425  LIPASE 9*  AMYLASE --   No results found for this basename: AMMONIA:5 in the last 168 hours CBC:  Lab 10/16/11 0312 10/15/11 0402 10/14/11 0325 10/13/11 0425 10/12/11 1859  WBC 4.4 4.7 6.6 15.5* 23.7*  NEUTROABS -- 3.4 5.2 13.9* 22.1*  HGB 9.7* 10.6* 10.7* 14.6 14.7  HCT 30.5* 33.2* 32.6* 44.2 44.5  MCV 95.9 96.5 95.9 93.2 92.7  PLT 127* 123* 121* 150 185   Cardiac Enzymes:  Lab 10/13/11 1621 10/13/11 1040 10/13/11 0425  CKTOTAL 40 47 --  CKMB 3.9 4.6* --  CKMBINDEX -- -- --  TROPONINI <0.30 <0.30 0.96*   BNP (last 3 results) No results found for this basename: PROBNP:3 in the last 8760 hours CBG: No results found for this basename: GLUCAP:5 in the last 168 hours  Recent Results (from the past 240 hour(s))  CULTURE, BLOOD (ROUTINE X 2)     Status: Normal (Preliminary  result)   Collection Time   10/13/11 12:00 AM      Component Value Range Status Comment   Specimen Description BLOOD RIGHT ANTECUBITAL   Final    Special Requests BOTTLES DRAWN AEROBIC AND ANAEROBIC 5 CC EACH   Final    Culture  Setup Time 10/13/2011 14:16   Final    Culture     Final    Value:        BLOOD CULTURE RECEIVED NO GROWTH TO DATE CULTURE WILL BE HELD FOR 5 DAYS BEFORE ISSUING A FINAL NEGATIVE REPORT   Report Status PENDING   Incomplete   CULTURE, BLOOD (ROUTINE X 2)     Status: Normal (Preliminary result)   Collection Time   10/13/11 12:10 AM      Component Value Range Status Comment   Specimen Description BLOOD LEFT HAND   Final    Special Requests BOTTLES DRAWN AEROBIC ONLY 3 CC  Final    Culture  Setup Time 10/13/2011 14:16   Final    Culture     Final    Value:        BLOOD CULTURE RECEIVED NO GROWTH TO DATE CULTURE WILL BE HELD FOR 5 DAYS BEFORE ISSUING A FINAL NEGATIVE REPORT   Report Status PENDING   Incomplete   MRSA PCR SCREENING     Status: Normal   Collection Time   10/13/11  3:28 AM      Component Value Range Status Comment   MRSA by PCR NEGATIVE  NEGATIVE Final   URINE CULTURE     Status: Normal   Collection Time   10/13/11 11:26 AM      Component Value Range Status Comment   Specimen Description URINE, RANDOM   Final    Special Requests NONE   Final    Culture  Setup Time 10/14/2011 02:35   Final    Colony Count NO GROWTH   Final    Culture NO GROWTH   Final    Report Status 10/14/2011 FINAL   Final   CLOSTRIDIUM DIFFICILE BY PCR     Status: Normal   Collection Time   10/14/11  9:07 AM      Component Value Range Status Comment   C difficile by pcr NEGATIVE  NEGATIVE Final      Studies: No results found.  Scheduled Meds:    . budesonide-formoterol  2 puff Inhalation BID  . chlorpheniramine-HYDROcodone  5 mL Oral Once  . famotidine  20 mg Oral Daily  . fluticasone  2 spray Each Nare Daily  . gabapentin  100 mg Oral TID  . hyoscyamine  0.125 mg  Sublingual Daily  . levofloxacin (LEVAQUIN) IV  750 mg Intravenous Q48H  . metronidazole  500 mg Intravenous Q8H  . nebivolol  2.5 mg Oral Daily  . sodium chloride  3 mL Intravenous Q12H  . tiotropium  18 mcg Inhalation Daily  . DISCONTD: nebivolol  5 mg Oral Daily   Continuous Infusions:    . sodium chloride 75 mL/hr at 10/15/11 2346    Principal Problem:  *SIRS (systemic inflammatory response syndrome) Active Problems:  HYPERTENSION  COPD  GERD  Pancolitis  Headache  Cardiac enzymes elevated  Pulmonary infiltrate in right lung on CXR    Time spent: >62mins    [facility] C  [facility] Pager [phone]. If 8PM-8AM, please contact night-coverage at [email or web address], password [facility] 10/16/2011, 9:48 AM  LOS: 4 days

## 2011-10-16 NOTE — Progress Notes (Signed)
Patient came from ICU, alert and orientedx4.

## 2011-10-16 NOTE — Progress Notes (Signed)
Nutrition Follow-up  Intervention:  Ensure Complete bid, encouraged po  Assessment:   Ate only bites of Regular diet this am secondary to very poor appetite.  Now willing to try Ensure.  Diet Order:  Regular  Meds: Scheduled Meds:   . budesonide-formoterol  2 puff Inhalation BID  . chlorpheniramine-HYDROcodone  5 mL Oral Once  . famotidine  20 mg Oral Daily  . fluticasone  2 spray Each Nare Daily  . gabapentin  100 mg Oral TID  . hyoscyamine  0.125 mg Sublingual Daily  . levofloxacin (LEVAQUIN) IV  750 mg Intravenous Q48H  . metronidazole  500 mg Intravenous Q8H  . nebivolol  2.5 mg Oral Daily  . sodium chloride  3 mL Intravenous Q12H  . tiotropium  18 mcg Inhalation Daily  . DISCONTD: nebivolol  5 mg Oral Daily   Continuous Infusions:   . sodium chloride 75 mL/hr at 10/15/11 2346   PRN Meds:.acetaminophen, acetaminophen, albuterol, butalbital-acetaminophen-caffeine, chlorpheniramine-HYDROcodone, cyclobenzaprine, diclofenac sodium, HYDROmorphone, nitroGLYCERIN, ondansetron (ZOFRAN) IV, ondansetron  Labs:  CMP     Component Value Date/Time   NA 141 10/16/2011 0312   K 3.9 10/16/2011 0312   CL 108 10/16/2011 0312   CO2 30 10/16/2011 0312   GLUCOSE 108* 10/16/2011 0312   BUN 4* 10/16/2011 0312   CREATININE 0.58 10/16/2011 0312   CREATININE 0.80 03/18/2011 1428   CALCIUM 7.6* 10/16/2011 0312   PROT 4.6* 10/14/2011 0325   ALBUMIN 2.4* 10/14/2011 0325   AST 13 10/14/2011 0325   ALT 7 10/14/2011 0325   ALKPHOS 70 10/14/2011 0325   BILITOT 0.2* 10/14/2011 0325   GFRNONAA 82* 10/16/2011 0312   GFRAA >90 10/16/2011 0312     Intake/Output Summary (Last 24 hours) at 10/16/11 1052 Last data filed at 10/16/11 0900  Gross per 24 hour  Intake   2553 ml  Output    770 ml  Net   1783 ml    Weight Status:  59.6 kg (55.3 kg on admit)  Re-estimated needs:  1300-1400 kcal, 55-65 gm protein  Nutrition Dx:  Predicted suboptimal intake -now Inadequate oral intake related to poor appetite AEB  observed poor intake  Goal:  Intake >75% meals and supplements  Monitor:  Intake, labs, weight   Oran Rein, RD, LDN Clinical Inpatient Dietitian Pager:  [phone] Weekend and after hours pager:  [phone]

## 2011-10-16 NOTE — Progress Notes (Signed)
[facility] Progress Note  Subjective: Feeling better. No abdominal pain. Tolerating diet.  Objective: Vital signs in last 24 hours: Temp:  [97.8 F (36.6 C)-98.5 F (36.9 C)] 98.4 F (36.9 C) (09/19 0800) Pulse Rate:  [75-107] 107  (09/19 0925) Resp:  [11-18] 15  (09/19 0925) BP: (97-141)/(48-71) 141/65 mmHg (09/19 0925) SpO2:  [93 %-100 %] 99 % (09/19 0925) Weight:  [59.6 kg (131 lb 6.3 oz)] 59.6 kg (131 lb 6.3 oz) (09/19 0000) Weight change:    PE  Abdomen soft non tender. Lab Results: Results for orders placed during the hospital encounter of 10/12/11 (from the past 24 hour(s))  CBC     Status: Abnormal   Collection Time   10/16/11  3:12 AM      Component Value Range   WBC 4.4  4.0 - 10.5 K/uL   RBC 3.18 (*) 3.87 - 5.11 MIL/uL   Hemoglobin 9.7 (*) 12.0 - 15.0 g/dL   HCT 40.9 (*) 81.1 - 91.4 %   MCV 95.9  78.0 - 100.0 fL   MCH 30.5  26.0 - 34.0 pg   MCHC 31.8  30.0 - 36.0 g/dL   RDW 78.2  95.6 - 21.3 %   Platelets 127 (*) 150 - 400 K/uL  BASIC METABOLIC PANEL     Status: Abnormal   Collection Time   10/16/11  3:12 AM      Component Value Range   Sodium 141  135 - 145 mEq/L   Potassium 3.9  3.5 - 5.1 mEq/L   Chloride 108  96 - 112 mEq/L   CO2 30  19 - 32 mEq/L   Glucose, Bld 108 (*) 70 - 99 mg/dL   BUN 4 (*) 6 - 23 mg/dL   Creatinine, Ser 0.86  0.50 - 1.10 mg/dL   Calcium 7.6 (*) 8.4 - 10.5 mg/dL   GFR calc non Af Amer 82 (*) >90 mL/min   GFR calc Af Amer >90  >90 mL/min    Studies/Results: @[id number] @    Assessment: Pancolitis, infectious vs ischemic. Improved.  Plan: Transfer to floor. Continue regular diet.     Linville Decarolis F 10/16/2011, 10:14 AM

## 2011-10-17 DIAGNOSIS — K319 Disease of stomach and duodenum, unspecified: Secondary | ICD-10-CM

## 2011-10-17 DIAGNOSIS — R5381 Other malaise: Secondary | ICD-10-CM

## 2011-10-17 LAB — CBC
HCT: 31.1 % — ABNORMAL LOW (ref 36.0–46.0)
Hemoglobin: 9.9 g/dL — ABNORMAL LOW (ref 12.0–15.0)
MCH: 30.5 pg (ref 26.0–34.0)
MCHC: 31.8 g/dL (ref 30.0–36.0)
MCV: 95.7 fL (ref 78.0–100.0)
Platelets: 137 10*3/uL — ABNORMAL LOW (ref 150–400)
RBC: 3.25 MIL/uL — ABNORMAL LOW (ref 3.87–5.11)
RDW: 13.8 % (ref 11.5–15.5)
WBC: 5 10*3/uL (ref 4.0–10.5)

## 2011-10-17 NOTE — Evaluation (Signed)
Physical Therapy Evaluation Patient Details Name: [name] [name] MRN: [id number] DOB: Feb 08, 1925 Today's Date: 10/17/2011 Time: 7829-5621 PT Time Calculation (min): 31 min  PT Assessment / Plan / Recommendation Clinical Impression  78 yo female presents with deconditioning.  She was admitted with SIRS and currently is requiring supplemental O2 as she decreases O2 sats with any activity.  Recommend 24/7 assist with follow up PT and monitoring of O2 sats    PT Assessment  Patient needs continued PT services    Follow Up Recommendations  Home health PT;Inpatient Rehab;Supervision/Assistance - 24 hour    Barriers to Discharge Decreased caregiver support      Equipment Recommendations  None recommended by PT    Recommendations for Other Services Rehab consult (per family request)   Frequency Min 3X/week    Precautions / Restrictions Precautions Precautions: Fall Precaution Comments: gdtr reports patient has a chronic pain pump   Pertinent Vitals/Pain Pt c/o headache      Mobility  Bed Mobility Details for Bed Mobility Assistance: pt up in hallway with family with straight cane, though pt appears unsteady and was found to have decreased O2 sats Transfers Transfers: Sit to Stand;Stand to Sit Sit to Stand: 4: Min guard;With upper extremity assist Stand to Sit: 4: Min guard Details for Transfer Assistance: pt with O2 desturation with minor activity Ambulation/Gait Ambulation/Gait Assistance: 4: Min assist Ambulation Distance (Feet): 40 Feet Assistive device: Rolling walker Ambulation/Gait Assistance Details: pt needs RW for stability in gait Gait Pattern: Decreased step length - right;Decreased step length - left Gait velocity: decreased General Gait Details: pt appears deconditioned while walking. She needs continued O2 support and still has deceased O2 saturation Stairs: No Wheelchair Mobility Wheelchair Mobility: No    Exercises Other Exercises Other Exercises: began  instruction in core strengthening: glute sets, abd sets. standing quad sets   PT Diagnosis: Difficulty walking;Generalized weakness;Abnormality of gait  PT Problem List: Decreased strength;Decreased activity tolerance;Cardiopulmonary status limiting activity;Decreased safety awareness;Decreased mobility PT Treatment Interventions: DME instruction;Gait training;Functional mobility training;Therapeutic activities;Therapeutic exercise;Balance training   PT Goals Acute Rehab PT Goals PT Goal Formulation: With patient/family Time For Goal Achievement: 10/31/11 Potential to Achieve Goals: Good Pt will go Supine/Side to Sit: with supervision PT Goal: Supine/Side to Sit - Progress: Goal set today Pt will go Sit to Supine/Side: with supervision PT Goal: Sit to Supine/Side - Progress: Goal set today Pt will go Sit to Stand: with supervision PT Goal: Sit to Stand - Progress: Goal set today Pt will go Stand to Sit: with supervision PT Goal: Stand to Sit - Progress: Goal set today Pt will Stand: with supervision PT Goal: Stand - Progress: Goal set today Pt will Ambulate: 51 - 150 feet;with modified independence;with least restrictive assistive device PT Goal: Ambulate - Progress: Goal set today Pt will Go Up / Down Stairs: with supervision;with least restrictive assistive device PT Goal: Up/Down Stairs - Progress: Goal set today  Visit Information  Last PT Received On: 10/17/11 Assistance Needed: +1    Subjective Data  Subjective: per family, pt needs to walk Patient Stated Goal: per family to go to rehab prior to returning home alone   Prior Functioning  Home Living Lives With: Alone Available Help at Discharge: Available PRN/intermittently Type of Home: House Home Access: Stairs to enter [employer] of Steps: 2 Entrance Stairs-Rails: Right;Left;Can reach both Home Layout: One level Bathroom Shower/Tub: Health visitor: Handicapped height Home Adaptive  Equipment: Shower chair with back;Bedside commode/3-in-1;Straight cane;Walker - rolling Additional  Comments: oxygen at night Prior Function Level of Independence: Independent with assistive device(s) Able to Take Stairs?: Yes Driving: Yes Vocation: Retired Comments: pt very active at home at baseline  She uses a RW or cane when out of door, but typically holds onto furniture in the house Communication Communication: No difficulties Dominant Hand: Right    Cognition  Overall Cognitive Status: Appears within functional limits for tasks assessed/performed Arousal/Alertness: Awake/alert Orientation Level: Appears intact for tasks assessed Behavior During Session: [facility] for tasks performed    Extremity/Trunk Assessment Right Lower Extremity Assessment RLE ROM/Strength/Tone: Deficits RLE ROM/Strength/Tone Deficits: generalized muscle atrophy Left Lower Extremity Assessment LLE ROM/Strength/Tone: Deficits LLE ROM/Strength/Tone Deficits: generalized muscle atrophy   Balance Balance Balance Assessed: Yes Static Standing Balance Static Standing - Balance Support: Right upper extremity supported;Left upper extremity supported;Bilateral upper extremity supported;No upper extremity supported;During functional activity Static Standing - Level of Assistance: 5: Stand by assistance Static Standing - Comment/# of Minutes: 5 with several rest breaks.  Pt with decreased O2 with activity in standing  End of Session PT - End of Session Equipment Utilized During Treatment: Gait belt;Oxygen Activity Tolerance: Patient tolerated treatment well;Treatment limited secondary to medical complications (Comment);Other (comment) (decreased O2 sats) Nurse Communication: Mobility status  GP     Bayard Hugger. Manson Passey, Pt [phone] 10/17/2011, 3:12 PM

## 2011-10-17 NOTE — Progress Notes (Signed)
[facility] Progress Note  Subjective: Feels better but still with some diarrhea. Stool was neg for C dif.  Objective: Vital signs in last 24 hours: Temp:  [97.5 F (36.4 C)-98.6 F (37 C)] 97.5 F (36.4 C) (09/20 0612) Pulse Rate:  [81-92] 81  (09/20 0612) Resp:  [14-20] 16  (09/20 0612) BP: (112-151)/(50-66) 127/54 mmHg (09/20 0612) SpO2:  [70 %-100 %] 100 % (09/20 1455) Weight:  [59.421 kg (131 lb)] 59.421 kg (131 lb) (09/20 0612) Weight change: -0.179 kg (-6.3 oz)   PEAbdomen soft Lab Results: Results for orders placed during the hospital encounter of 10/12/11 (from the past 24 hour(s))  CBC     Status: Abnormal   Collection Time   10/17/11  3:17 AM      Component Value Range   WBC 5.0  4.0 - 10.5 K/uL   RBC 3.25 (*) 3.87 - 5.11 MIL/uL   Hemoglobin 9.9 (*) 12.0 - 15.0 g/dL   HCT 16.1 (*) 09.6 - 04.5 %   MCV 95.7  78.0 - 100.0 fL   MCH 30.5  26.0 - 34.0 pg   MCHC 31.8  30.0 - 36.0 g/dL   RDW 40.9  81.1 - 91.4 %   Platelets 137 (*) 150 - 400 K/uL    Studies/Results: @RISRSLT24 @    Assessment: Pancolitis gradual improvement  Plan: Continue supportive care    Amari Burnsworth F 10/17/2011, 4:33 PM

## 2011-10-17 NOTE — Progress Notes (Signed)
ANTIBIOTIC CONSULT NOTE - Follow up  Pharmacy Consult for Levaquin Indication: PNA, Colitis  Allergies  Allergen Reactions  . Codeine     REACTION: n/v/d, HA  . Morphine     REACTION: n/v/d, HA  . Sulfa Antibiotics   . Zoledronic Acid     REACTION: Severe edema   Patient Measurements: Height: 5' (152.4 cm) Weight: 131 lb (59.421 kg) IBW/kg (Calculated) : 45.5   Vital Signs: Temp: 97.5 F (36.4 C) (09/20 0612) Temp src: Oral (09/20 0612) BP: 127/54 mmHg (09/20 0612) Pulse Rate: 81  (09/20 0612) Intake/Output from previous day: 09/19 0701 - 09/20 0700 In: 1804 [I.V.:1554; IV Piggyback:250] Out: 602 [Urine:600; Stool:2] Intake/Output from this shift:    Labs:  Basename 10/17/11 0317 10/16/11 0312 10/15/11 0402  WBC 5.0 4.4 4.7  HGB 9.9* 9.7* 10.6*  PLT 137* 127* 123*  LABCREA -- -- --  CREATININE -- 0.58 0.64   Estimated Creatinine Clearance: 42.2 ml/min (by C-G formula based on Cr of 0.58). No results found for this basename: VANCOTROUGH:2,VANCOPEAK:2,VANCORANDOM:2,GENTTROUGH:2,GENTPEAK:2,GENTRANDOM:2,TOBRATROUGH:2,TOBRAPEAK:2,TOBRARND:2,AMIKACINPEAK:2,AMIKACINTROU:2,AMIKACIN:2, in the last 72 hours   Assessment:  8 yof w/hx COPD, HTN, pancreatitis, h/o ERCP and cholecystecomy admitted with abd pain. Patient was started on Cipro and Flagyl for pancolitis/SIRS on 9/15. MD d/c'd cipro and start Levaquin per MD this AM for additional lung coverage (R lung infiltrate)  Day #4 Levaquin, now PO.  Day # 6 Flagyl.  Afeb, WBC wnl, Scr stable  All cx negative. Await final on blood.  Plan:   Cont Levaquin 500mg  PO q24h.  Cont Flagyl  500mg  PO q8h - dosed per MD.   Pharmacy will sign-off.  Charolotte Eke, PharmD, pager [phone]. 10/17/2011,11:12 AM.

## 2011-10-17 NOTE — Evaluation (Signed)
Occupational Therapy Evaluation Patient Details Name: [name] [name] MRN: [id number] DOB: 10/11/1925 Today's Date: 10/17/2011 Time: 4098-1191 OT Time Calculation (min): 34 min  OT Assessment / Plan / Recommendation Clinical Impression  Pt is an 78 yo female who presents with SIRS 2* pancolitis. Pt lives alone and the family is requesting and inpt rehab referral. They stated home health therapy was not successful last time. Skilled OT indicated to maximize independence with BADLs to mod I level in prep for d/c to next venue of care or home with HHOT.    OT Assessment  Patient needs continued OT Services    Follow Up Recommendations  Skilled nursing facility;Inpatient Rehab (If family refuses than HHOT)    Barriers to Discharge Decreased caregiver support    Equipment Recommendations  None recommended by OT    Recommendations for Other Services Rehab consult  Frequency  Min 2X/week    Precautions / Restrictions Precautions Precautions: Fall Restrictions Weight Bearing Restrictions: No   Pertinent Vitals/Pain Pt's O2 sats on 3L 97% following activity.    ADL  Grooming: Performed;Wash/dry hands;Min guard Where Assessed - Grooming: Supported standing Upper Body Bathing: Simulated;Set up Where Assessed - Upper Body Bathing: Unsupported sitting Lower Body Bathing: Simulated;Minimal assistance Where Assessed - Lower Body Bathing: Supported sit to stand Upper Body Dressing: Simulated;Set up Where Assessed - Upper Body Dressing: Unsupported sitting Lower Body Dressing: Simulated;Minimal assistance Where Assessed - Lower Body Dressing: Supported sit to stand Toilet Transfer: Performed;Min guard Statistician Method: Sit to Barista: Raised toilet seat with arms (or 3-in-1 over toilet) Toileting - Clothing Manipulation and Hygiene: Performed;Minimal assistance Where Assessed - Engineer, mining and Hygiene: Sit to stand from 3-in-1 or  toilet Equipment Used: Rolling walker;Gait belt Transfers/Ambulation Related to ADLs: Pt ambulated to the bathroom with minguard A and RW. Fatigues quickly ADL Comments: Pt required min VCs for safe manipulation of RW around the bathroom.    OT Diagnosis: Generalized weakness  OT Problem List: Decreased activity tolerance;Decreased safety awareness;Decreased knowledge of use of DME or AE OT Treatment Interventions: Self-care/ADL training;Therapeutic activities;DME and/or AE instruction;Patient/family education;Energy conservation   OT Goals Acute Rehab OT Goals OT Goal Formulation: With patient/family Time For Goal Achievement: 10/31/11 Potential to Achieve Goals: Good ADL Goals Pt Will Perform Grooming: with modified independence;Standing at sink ADL Goal: Grooming - Progress: Goal set today Pt Will Transfer to Toilet: with modified independence;Ambulation;Regular height toilet;Comfort height toilet;3-in-1 ADL Goal: Toilet Transfer - Progress: Goal set today Pt Will Perform Toileting - Clothing Manipulation: with modified independence;Sitting on 3-in-1 or toilet;Standing ADL Goal: Toileting - Clothing Manipulation - Progress: Goal set today Pt Will Perform Toileting - Hygiene: with modified independence;Sit to stand from 3-in-1/toilet ADL Goal: Toileting - Hygiene - Progress: Goal set today Pt Will Perform Tub/Shower Transfer: Shower transfer;with modified independence;Ambulation ADL Goal: Tub/Shower Transfer - Progress: Goal set today Additional ADL Goal #1: Pt will complete all aspects of bathing and dressing including gathering ADL items with mod I  ADL Goal: Additional Goal #1 - Progress: Goal set today Miscellaneous OT Goals Miscellaneous OT Goal #1: Pt will incorporate 3 energy conservation strategies into self care routine with min VCs. OT Goal: Miscellaneous Goal #1 - Progress: Goal set today  Visit Information  Last OT Received On: 10/17/11 Assistance Needed: +1     Subjective Data  Subjective: This room is more unsafe than my house Patient Stated Goal: Ihor Gully the yard   Prior Functioning  Vision/Perception  Home Living Lives  With: Alone Available Help at Discharge: Available PRN/intermittently Type of Home: House Home Access: Stairs to enter Entrance Stairs-Number of Steps: 2 Entrance Stairs-Rails: Right;Left;Can reach both Home Layout: One level Bathroom Shower/Tub: Health visitor: Handicapped height Home Adaptive Equipment: Shower chair with back;Bedside commode/3-in-1;Straight cane;Walker - rolling Prior Function Level of Independence: Independent with assistive device(s) Able to Take Stairs?: Yes Driving: Yes Vocation: Retired Musician: No difficulties Dominant Hand: Right      Cognition  Overall Cognitive Status: Appears within functional limits for tasks assessed/performed Arousal/Alertness: Awake/alert Orientation Level: Appears intact for tasks assessed Behavior During Session: [facility] for tasks performed    Extremity/Trunk Assessment Right Upper Extremity Assessment RUE ROM/Strength/Tone: [facility] for tasks assessed Left Upper Extremity Assessment LUE ROM/Strength/Tone: WFL for tasks assessed   Mobility  Shoulder Instructions  Bed Mobility Bed Mobility: Supine to Sit Supine to Sit: 6: Modified independent (Device/Increase time) Transfers Transfers: Sit to Stand;Stand to Sit Sit to Stand: 4: Min guard;With upper extremity assist;From bed;From chair/3-in-1 Stand to Sit: 4: Min guard;With upper extremity assist;With armrests;To chair/3-in-1 Details for Transfer Assistance: Occasional VCs for hand placement and safety.       Exercise     Balance Static Sitting Balance Static Sitting - Balance Support: No upper extremity supported;Feet supported Static Sitting - Level of Assistance: 7: Independent   End of Session OT - End of Session Equipment Utilized During Treatment: Gait belt Activity  Tolerance: Patient tolerated treatment well Patient left: in chair;with call bell/phone within reach;with family/visitor present  GO     Dashun Borre A OTR/L [phone] 10/17/2011, 10:53 AM

## 2011-10-17 NOTE — Progress Notes (Signed)
[facility] PROGRESS NOTE  [name] [name] EAV:[id number] DOB: 11-13-1925 DOA: 10/12/2011 PCP: [name], MD  Assessment/Plan: Principal Problem:  *SIRS (systemic inflammatory response syndrome) Active Problems:  HYPERTENSION  COPD  GERD  Pancolitis  Headache  Cardiac enzymes elevated  Pulmonary infiltrate in right lung on CXR  #1. SIRS secondary to pancolitis -  -C. difficile PCR negative. Stool cultures pending. Patient is currently afebrile. No leukocytosis.  -Change to by mouth Levaquin to treat both of patient's pancolitis and right lung infiltrate. Change to by mouth Flagyl as per GI recommendation- total of 10 days of antibiotics recommended. -Patient tolerating regular diet -No further hypotensive episodes last p.m.  -GI following, Appreciate input and recommendations-patient to follow up in office with Dr. Ewing Schlein as scheduled  -PT OT recommending rehabilitation in CIR consulted  #2 pancolitis -As above, GI following  #3 right lung infiltrate. Chest x-ray Questionable etiology likely secondary to aspiration pneumonia as patient did have several episodes of emesis prior to admission. Patient denies any shortness of breath. Patient with no productive cough. On oral Levaquin as above. Patient will likely need a repeat chest x-ray done in about 4-6 weeks for resolution of infiltrate.  #3. Leukocytosis probably secondary #1 2 and 3, -resolved with treatment as above  #4. Headache probably precipitated by #1.  Continue Fioricet when necessary  #5. Chronic pain on Dilaudid pump.  Continue pain regimen.  #6. COPD with no exacerbation -   stable continue home medications.   #7.h/o Hypertension - Hold parameters added to home medications due to hypotension last pm as above.   #8. History of CHF/cor pulmonale -  presently not short of breath and does not look decompensated. IV fluids decreased continue to follow  #9 elevated cardiac enzymes First set  of cardiac enzymes were elevated however serial enzymes have been negative. 2-D echo with a normal EF with no wall motion abnormalities. Likely secondary to current infection. No further cardiac workup is needed at this time.  -Patient remains asymptomatic. #10 prophylaxis Pepcid for GI prophylaxis. SCDs for DVT prophylaxis.   Code Status: Full Family Communication: Updated patient. No family at bedside. Disposition Plan: Home versus SNF when medically stable.   Brief narrative: 78 year-old female with history of COPD on home oxygen, hypertension, history of pancreatitis and had undergone ERCP with sphincterotomy and subsequently cholecystectomy presents with complaints of abdominal pain. The abdominal pain started off early in the morning. The pain has been more in the epigastric area episodic crampy associated nausea no vomiting denies any diarrhea. Has subjective feeling of fever chills. Patient's records show patient has been on antibiotics last month for bronchitis. In addition patient also is complaining of frontal headache which started off same time. Patient denies any blurred vision or focal deficits oral neck pain. In the ER patient was found to be tachycardic with leukocytosis. Patient's CT abdomen pelvis shows diffuse pancolitis. Lactic acid levels were normal. Patient was admitted for further management.    Consultants:  GI Dr. Evette [name] 10/13/2011  Procedures:  2-D echo 10/13/2011  Acute abdominal series 10/12/2011  CT of the abdomen and pelvis 10/12/2011  Antibiotics:  IV ciprofloxacin 10/13/2011 to 10/14/2011  IV Flagyl 10/13/2011  IV Levaquin 10/14/2011  HPI/Subjective: Sitting up in chair, reports loose stools today. Family at bedside. Denies pain, denies shortness of breath.  Objective: Filed Vitals:   10/16/11 2154 10/17/11 0245 10/17/11 0612 10/17/11 1004  BP: 151/66 112/54 127/54   Pulse: 92 81 81  Temp: 98.6 F (37 C)  97.5 F (36.4 C)   TempSrc: Oral   Oral   Resp: 16 14 16    Height:      Weight:   59.421 kg (131 lb)   SpO2: 100% 100% 100% 96%    Intake/Output Summary (Last 24 hours) at 10/17/11 1107 Last data filed at 10/17/11 9604  Gross per 24 hour  Intake   1254 ml  Output    100 ml  Net   1154 ml   Filed Weights   10/14/11 0500 10/16/11 0000 10/17/11 0612  Weight: 57.1 kg (125 lb 14.1 oz) 59.6 kg (131 lb 6.3 oz) 59.421 kg (131 lb)    Exam:   General:  NAD  Cardiovascular: RRR  Respiratory: Decreased breath sounds at the bases, otherwise clear  Abdomen: Soft/NT/ND/+BS  Extremities: No cyanosis and no edema  Data Reviewed: Basic Metabolic Panel:  Lab 10/16/11 5409 10/15/11 0402 10/14/11 0325 10/13/11 0425 10/12/11 1859  NA 141 139 138 135 137  K 3.9 3.8 4.2 4.0 3.4*  CL 108 105 105 98 95*  CO2 30 26 29 28 30   GLUCOSE 108* 94 137* 156* 195*  BUN 4* 4* 10 16 14   CREATININE 0.58 0.64 0.72 0.69 0.69  CALCIUM 7.6* 7.5* 7.4* 8.0* 9.6  MG -- -- 1.6 -- --  PHOS -- -- -- -- --   Liver Function Tests:  Lab 10/14/11 0325 10/13/11 0425 10/12/11 1859  AST 13 17 22   ALT 7 9 12   ALKPHOS 70 115 137*  BILITOT 0.2* 0.2* 0.3  PROT 4.6* 5.6* 6.9  ALBUMIN 2.4* 2.7* 3.7    Lab 10/13/11 0425  LIPASE 9*  AMYLASE --   No results found for this basename: AMMONIA:5 in the last 168 hours CBC:  Lab 10/17/11 0317 10/16/11 0312 10/15/11 0402 10/14/11 0325 10/13/11 0425 10/12/11 1859  WBC 5.0 4.4 4.7 6.6 15.5* --  NEUTROABS -- -- 3.4 5.2 13.9* 22.1*  HGB 9.9* 9.7* 10.6* 10.7* 14.6 --  HCT 31.1* 30.5* 33.2* 32.6* 44.2 --  MCV 95.7 95.9 96.5 95.9 93.2 --  PLT 137* 127* 123* 121* 150 --   Cardiac Enzymes:  Lab 10/13/11 1621 10/13/11 1040 10/13/11 0425  CKTOTAL 40 47 --  CKMB 3.9 4.6* --  CKMBINDEX -- -- --  TROPONINI <0.30 <0.30 0.96*   BNP (last 3 results) No results found for this basename: PROBNP:3 in the last 8760 hours CBG: No results found for this basename: GLUCAP:5 in the last 168 hours  Recent Results  (from the past 240 hour(s))  CULTURE, BLOOD (ROUTINE X 2)     Status: Normal (Preliminary result)   Collection Time   10/13/11 12:00 AM      Component Value Range Status Comment   Specimen Description BLOOD RIGHT ANTECUBITAL   Final    Special Requests BOTTLES DRAWN AEROBIC AND ANAEROBIC 5 CC EACH   Final    Culture  Setup Time 10/13/2011 14:16   Final    Culture     Final    Value:        BLOOD CULTURE RECEIVED NO GROWTH TO DATE CULTURE WILL BE HELD FOR 5 DAYS BEFORE ISSUING A FINAL NEGATIVE REPORT   Report Status PENDING   Incomplete   CULTURE, BLOOD (ROUTINE X 2)     Status: Normal (Preliminary result)   Collection Time   10/13/11 12:10 AM      Component Value Range Status Comment   Specimen Description BLOOD LEFT HAND  Final    Special Requests BOTTLES DRAWN AEROBIC ONLY 3 CC   Final    Culture  Setup Time 10/13/2011 14:16   Final    Culture     Final    Value:        BLOOD CULTURE RECEIVED NO GROWTH TO DATE CULTURE WILL BE HELD FOR 5 DAYS BEFORE ISSUING A FINAL NEGATIVE REPORT   Report Status PENDING   Incomplete   MRSA PCR SCREENING     Status: Normal   Collection Time   10/13/11  3:28 AM      Component Value Range Status Comment   MRSA by PCR NEGATIVE  NEGATIVE Final   URINE CULTURE     Status: Normal   Collection Time   10/13/11 11:26 AM      Component Value Range Status Comment   Specimen Description URINE, RANDOM   Final    Special Requests NONE   Final    Culture  Setup Time 10/14/2011 02:35   Final    Colony Count NO GROWTH   Final    Culture NO GROWTH   Final    Report Status 10/14/2011 FINAL   Final   CLOSTRIDIUM DIFFICILE BY PCR     Status: Normal   Collection Time   10/14/11  9:07 AM      Component Value Range Status Comment   C difficile by pcr NEGATIVE  NEGATIVE Final      Studies: No results found.  Scheduled Meds:    . budesonide-formoterol  2 puff Inhalation BID  . famotidine  20 mg Oral Daily  . feeding supplement  237 mL Oral BID BM  .  fluticasone  2 spray Each Nare Daily  . gabapentin  100 mg Oral TID  . hyoscyamine  0.125 mg Sublingual Daily  . levofloxacin  500 mg Oral Q48H  . metroNIDAZOLE  500 mg Oral Q8H  . nebivolol  2.5 mg Oral Daily  . sodium chloride  3 mL Intravenous Q12H  . tiotropium  18 mcg Inhalation Daily  . DISCONTD: levofloxacin (LEVAQUIN) IV  750 mg Intravenous Q48H  . DISCONTD: metronidazole  500 mg Intravenous Q8H   Continuous Infusions:    . sodium chloride 75 mL/hr at 10/17/11 0556    Principal Problem:  *SIRS (systemic inflammatory response syndrome) Active Problems:  HYPERTENSION  COPD  GERD  Pancolitis  Headache  Cardiac enzymes elevated  Pulmonary infiltrate in right lung on CXR    Time spent:    Kela Millin  [facility] Pager [phone]. If 8PM-8AM, please contact night-coverage at [email or web address], password [facility] 10/17/2011, 11:07 AM  LOS: 5 days

## 2011-10-17 NOTE — Consult Note (Signed)
Physical Medicine and Rehabilitation Consult Reason for Consult: deconditioning Referring Physician:  Dr Verlin Fester   HPI: [name] [name] is a 78 y.o. female with history of COPD, OA, chronic LBP who was admitted on 10/12/11 with abdominal pain, vomiting and diarrhea.  Patient with history of antibiotics recently for bronchitis.  Patient tachycardic with leucocytosis due to SIRS. Started on cipro and flagyl.  CT abdomen with evidence of colitis and GI consulted for input. Dr Evette [name] evaluated patient and felt that patient likely with pancolitis-infectious v/s ischemic in nature.  C-diff check negative. Abdominal pain resolved but po intake remains poor.  H/H on downward trend  10.7--->9.9. Patient noted to be deconditioned.  Evaluated by OT and CIR recommended.   Review of Systems  Eyes: Negative for blurred vision.  Respiratory: Positive for shortness of breath. Negative for cough.        Uses oxygen at nights.   Cardiovascular: Negative for chest pain and palpitations.  Gastrointestinal: Positive for abdominal pain and diarrhea.  Genitourinary: Negative for urgency and frequency.  Musculoskeletal: Positive for myalgias and back pain.  Neurological: Positive for weakness. Negative for dizziness and headaches.   Past Medical History  Diagnosis Date  . Osteoporosis   . Arthritis     osteoarthritis  . GERD (gastroesophageal reflux disease)   . Hiatal hernia   . COPD (chronic obstructive pulmonary disease)   . Pneumonia   . Hypotension   . PUD (peptic ulcer disease)   . Cardiac arrhythmia   . Hyperlipidemia   . Heart failure   . Hypertension   . Scoliosis deformity of spine     history of intractable back pain  . Choledocholithiasis   . Pancolitis     Infectious vs. inflammatory  . Abdominal pain     Resolved  . Tachycardia   . Chronic anemia   . Fecal impaction   . Chronic back pain   . Pancreatitis     History of  . CHF (congestive heart failure)    Past Surgical History    Procedure Date  . Cholecystectomy 2008  . Abdominal hysterectomy 1970s  . Spine surgery [phone]  . Neck surgery 1970s  . Spinal cord stimulator implant   . Ercp w/ sphicterotomy 02/2010  . Back surgery     x 5   Family History  Problem Relation Age of Onset  . Cancer Mother     uterine  . Heart disease Father   . Hypertension Other    Social History:  Lives alone. Independent PTA. reports that she quit smoking about 23 years ago. Her smoking use included Cigarettes. She has a 12 pack-year smoking history. She has never used smokeless tobacco. She reports that she does not drink alcohol or use illicit drugs. Has assistance with home management. Family can check in past discharge.    Allergies  Allergen Reactions  . Codeine     REACTION: n/v/d, HA  . Morphine     REACTION: n/v/d, HA  . Sulfa Antibiotics   . Zoledronic Acid     REACTION: Severe edema   Medications Prior to Admission  Medication Sig Dispense Refill  . albuterol (VENTOLIN HFA) 108 (90 BASE) MCG/ACT inhaler Inhale 1 puff into the lungs 3 (three) times daily as needed.        . Ascorbic Acid (VITAMIN C) 500 MG tablet Take 500 mg by mouth daily.        [name] aspirin 81 MG tablet Take 81 mg by mouth daily.        [name]  budesonide-formoterol (SYMBICORT) 160-4.5 MCG/ACT inhaler Inhale 2 puffs into the lungs 2 (two) times daily.        . butalbital-acetaminophen-caffeine (FIORICET) 50-325-40 MG per tablet Take 1 tablet by mouth every 8 (eight) hours as needed. Migraine      . Calcium Carbonate-Vitamin D (CALCIUM 600-D) 600-400 MG-UNIT per tablet Take 1 tablet by mouth 2 (two) times daily with a meal.        . cyclobenzaprine (FLEXERIL) 10 MG tablet Take 1 tablet by mouth 3 (three) times daily as needed.       . diclofenac sodium (VOLTAREN) 1 % GEL Apply 1 application topically 4 (four) times daily as needed. Inflammation      . ferrous fumarate-iron polysaccharide complex (TANDEM) 162-115.2 MG CAPS Take 1 capsule by  mouth daily.        . fluticasone (FLONASE) 50 MCG/ACT nasal spray       . gabapentin (NEURONTIN) 100 MG capsule Take 100 mg by mouth 3 (three) times daily.       [name] HYDROmorphone (DILAUDID) 2 MG tablet Take 2 mg by mouth as needed. pain      . hyoscyamine (ANASPAZ) 0.125 MG TBDP Place 0.125 mg under the tongue daily.      . Lactulose SOLN Take 2 mLs by mouth every evening.      . nebivolol (BYSTOLIC) 5 MG tablet Take 5 mg by mouth daily. Take 1/2 tablet by mouth daily.      . nitroGLYCERIN (NITROSTAT) 0.4 MG SL tablet Place 1 tablet (0.4 mg total) under the tongue every 5 (five) minutes as needed for chest pain.  100 tablet  3  . NON FORMULARY OXYGEN. 2L at night       . omeprazole (PRILOSEC) 20 MG capsule Take 2 capsules (40 mg total) by mouth daily.  60 capsule  6  . promethazine (PHENERGAN) 25 MG tablet Take 25 mg by mouth as needed. Nausea      . tiotropium (SPIRIVA) 18 MCG inhalation capsule Place 18 mcg into inhaler and inhale daily.        . Vitamin D, Ergocalciferol, (DRISDOL) 50000 UNITS CAPS Take 50,000 Units by mouth every 7 (seven) days.        Home: Home Living Lives With: Alone Available Help at Discharge: Available PRN/intermittently Type of Home: House Home Access: Stairs to enter [employer] of Steps: 2 Entrance Stairs-Rails: Right;Left;Can reach both Home Layout: One level Bathroom Shower/Tub: Health visitor: Handicapped height Home Adaptive Equipment: Shower chair with back;Bedside commode/3-in-1;Straight cane;Walker - rolling  Functional History: Prior Function Able to Take Stairs?: Yes Driving: Yes Vocation: Retired Functional Status:  Mobility: Bed Mobility Bed Mobility: Supine to Sit Supine to Sit: 6: Modified independent (Device/Increase time) Transfers Sit to Stand: 4: Min guard;With upper extremity assist;From bed;From chair/3-in-1 Stand to Sit: 4: Min guard;With upper extremity assist;With armrests;To chair/3-in-1       ADL: ADL Grooming: Performed;Wash/dry hands;Min guard Where Assessed - Grooming: Supported standing Upper Body Bathing: Simulated;Set up Where Assessed - Upper Body Bathing: Unsupported sitting Lower Body Bathing: Simulated;Minimal assistance Where Assessed - Lower Body Bathing: Supported sit to stand Upper Body Dressing: Simulated;Set up Where Assessed - Upper Body Dressing: Unsupported sitting Lower Body Dressing: Simulated;Minimal assistance Where Assessed - Lower Body Dressing: Supported sit to stand Toilet Transfer: Performed;Min guard Statistician Method: Sit to Barista: Raised toilet seat with arms (or 3-in-1 over toilet) Equipment Used: Rolling walker;Gait belt Transfers/Ambulation Related to ADLs: Pt ambulated  to the bathroom with minguard A and RW. Fatigues quickly ADL Comments: Pt required min VCs for safe manipulation of RW around the bathroom.  Cognition: Cognition Arousal/Alertness: Awake/alert Orientation Level: Oriented X4 Cognition Overall Cognitive Status: Appears within functional limits for tasks assessed/performed Arousal/Alertness: Awake/alert Orientation Level: Appears intact for tasks assessed Behavior During Session: [facility] for tasks performed  Blood pressure 127/54, pulse 81, temperature 97.5 F (36.4 C), temperature source Oral, resp. rate 16, height 5' (1.524 m), weight 59.421 kg (131 lb), SpO2 96.00%. Physical Exam  Nursing note and vitals reviewed. Constitutional: She is oriented to person, place, and time.       Thin elderly female, complaining of abdominal pain.  HENT:  Head: Normocephalic and atraumatic.  Eyes: Pupils are equal, round, and reactive to light.  Neck: Normal range of motion.  Cardiovascular: Normal rate and regular rhythm.   Pulmonary/Chest: Effort normal and breath sounds normal.  Abdominal: Soft. Bowel sounds are normal. She exhibits no distension. There is no tenderness.  Musculoskeletal: She  exhibits no edema.  Neurological: She is alert and oriented to person, place, and time. She has normal strength. No cranial nerve deficit or sensory deficit. She displays a negative Romberg sign.  Skin: Skin is warm and dry.       A few bruises, ecchymoses     Results for orders placed during the hospital encounter of 10/12/11 (from the past 24 hour(s))  CBC     Status: Abnormal   Collection Time   10/17/11  3:17 AM      Component Value Range   WBC 5.0  4.0 - 10.5 K/uL   RBC 3.25 (*) 3.87 - 5.11 MIL/uL   Hemoglobin 9.9 (*) 12.0 - 15.0 g/dL   HCT 16.1 (*) 09.6 - 04.5 %   MCV 95.7  78.0 - 100.0 fL   MCH 30.5  26.0 - 34.0 pg   MCHC 31.8  30.0 - 36.0 g/dL   RDW 40.9  81.1 - 91.4 %   Platelets 137 (*) 150 - 400 K/uL   No results found.  Assessment/Plan: Diagnosis: weakness related to SIRS, colitis 1. Does the need for close, 24 hr/day medical supervision in concert with the patient's rehab needs make it unreasonable for this patient to be served in a less intensive setting? No and Potentially 2. Co-Morbidities requiring supervision/potential complications: htn, copd 3. Due to bladder management, bowel management, safety, skin/wound care, disease management, medication administration and pain management, does the patient require 24 hr/day rehab nursing? Potentially 4. Does the patient require coordinated care of a physician, rehab nurse, PT (1-2 hrs/day, 5 days/week) and OT (1-2 hrs/day, 5 days/week) to address physical and functional deficits in the context of the above medical diagnosis(es)? Potentially Addressing deficits in the following areas: balance, endurance, locomotion, strength, transferring, bowel/bladder control, bathing, dressing, feeding, grooming and toileting 5. Can the patient actively participate in an intensive therapy program of at least 3 hrs of therapy per day at least 5 days per week? Yes 6. The potential for patient to make measurable gains while on inpatient rehab  is fair 7. Anticipated functional outcomes upon discharge from inpatient rehab are TBD with PT, TBD with OT, n/a with SLP. 8. Estimated rehab length of stay to reach the above functional goals is: to be determined 9. Does the patient have adequate social supports to accommodate these discharge functional goals? Yes 10. Anticipated D/C setting: Home 11. Anticipated post D/C treatments: HH therapy 12. Overall Rehab/Functional Prognosis: excellent  RECOMMENDATIONS:  This patient's condition is appropriate for continued rehabilitative care in the following setting: HH vs CIR Patient has agreed to participate in recommended program. Yes Note that insurance prior authorization may be required for reimbursement for recommended care.  Comment: Considering the fact that she had been mowing her grass two weeks ago and that she has just started getting up with therapy, my feeling is that she will be able to go home when medically stable. Rehab RN to follow up.   Ivory Broad, MD     10/17/2011

## 2011-10-18 LAB — CBC
HCT: 32.2 % — ABNORMAL LOW (ref 36.0–46.0)
Hemoglobin: 10.4 g/dL — ABNORMAL LOW (ref 12.0–15.0)
MCH: 30.9 pg (ref 26.0–34.0)
MCHC: 32.3 g/dL (ref 30.0–36.0)
MCV: 95.5 fL (ref 78.0–100.0)
Platelets: 175 10*3/uL (ref 150–400)
RBC: 3.37 MIL/uL — ABNORMAL LOW (ref 3.87–5.11)
RDW: 13.6 % (ref 11.5–15.5)
WBC: 4.6 10*3/uL (ref 4.0–10.5)

## 2011-10-18 LAB — BASIC METABOLIC PANEL
BUN: 6 mg/dL (ref 6–23)
CO2: 34 mEq/L — ABNORMAL HIGH (ref 19–32)
Calcium: 8.5 mg/dL (ref 8.4–10.5)
Chloride: 102 mEq/L (ref 96–112)
Creatinine, Ser: 0.71 mg/dL (ref 0.50–1.10)
GFR calc Af Amer: 89 mL/min — ABNORMAL LOW (ref >90)
GFR calc non Af Amer: 77 mL/min — ABNORMAL LOW (ref >90)
Glucose, Bld: 108 mg/dL — ABNORMAL HIGH (ref 70–99)
Potassium: 4.2 mEq/L (ref 3.5–5.1)
Sodium: 140 mEq/L (ref 135–145)

## 2011-10-18 NOTE — Progress Notes (Signed)
[name] [name] 2:14 PM  Subjective: Patient is doing better and her main complaint is a headache which is chronic and a little nausea but her diarrhea is better not having any abdominal pain and her hospital chart and office chart was reviewed  Objective:  vital signs stable afebrile no acute distress abdomen is soft nontender good bowel sounds adequate peripheral pulses white count hemoglobin stable  Assessment: Improved questionable infection versus ischemic colitis  Plan: Continue present management per hospital team and I will check on tomorrow and she has a followup with me in the office unfortunately I do not think other stool studies were done other than C. difficile which would've assisted possible in making a diagnosis  Jeter Tomey E

## 2011-10-18 NOTE — Progress Notes (Signed)
[facility] PROGRESS NOTE  [name] [name] ZOX:[id number] DOB: 1925-05-09 DOA: 10/12/2011 PCP: [name], MD  Assessment/Plan: Principal Problem:  *SIRS (systemic inflammatory response syndrome) Active Problems:  HYPERTENSION  COPD  GERD  Pancolitis  Headache  Cardiac enzymes elevated  Pulmonary infiltrate in right lung on CXR  #1. SIRS secondary to pancolitis -  -C. difficile PCR negative. Stool cultures pending. Patient is currently afebrile. No leukocytosis.  -Change to by mouth Levaquin to treat both of patient's pancolitis and right lung infiltrate. Change to by mouth Flagyl as per GI recommendation- total of 10 days of antibiotics recommended. -Patient tolerating regular diet -No further hypotensive episodes last p.m.  -GI following, Appreciate input and recommendations-patient to follow up in office with Dr. Ewing Schlein as scheduled  -CIR to followup for final decision  #2 pancolitis -As above, to followup with GI outpatient  #3 right lung infiltrate. Chest x-ray Questionable etiology likely secondary to aspiration pneumonia as patient did have several episodes of emesis prior to admission. Patient denies any shortness of breath. Patient with no productive cough. On oral Levaquin as above. Patient will likely need a repeat chest x-ray done in about 4-6 weeks for resolution of infiltrate.  #3. Leukocytosis probably secondary #1 2 and 3, -resolved with treatment as above  #4. Headache probably precipitated by #1.  Continue Fioricet when necessary  #5. Chronic pain on Dilaudid pump.  Continue pain regimen.  #6. COPD with no exacerbation -   stable continue home medications.   #7.h/o Hypertension - Hold parameters added to home medications due to hypotension last pm as above.   #8. History of CHF/cor pulmonale -  presently not short of breath and does not look decompensated. IV fluids decreased continue to follow  #9 elevated cardiac enzymes First  set of cardiac enzymes were elevated however serial enzymes have been negative. 2-D echo with a normal EF with no wall motion abnormalities. Likely secondary to current infection. No further cardiac workup is needed at this time.  -Patient remains asymptomatic. #10 prophylaxis Pepcid for GI prophylaxis. SCDs for DVT prophylaxis.   Code Status: Full Family Communication: Updated patient. No family at bedside. Disposition Plan: Home versus CIR when medically stable.   Brief narrative: 78 year-old female with history of COPD on home oxygen, hypertension, history of pancreatitis and had undergone ERCP with sphincterotomy and subsequently cholecystectomy presents with complaints of abdominal pain. The abdominal pain started off early in the morning. The pain has been more in the epigastric area episodic crampy associated nausea no vomiting denies any diarrhea. Has subjective feeling of fever chills. Patient's records show patient has been on antibiotics last month for bronchitis. In addition patient also is complaining of frontal headache which started off same time. Patient denies any blurred vision or focal deficits oral neck pain. In the ER patient was found to be tachycardic with leukocytosis. Patient's CT abdomen pelvis shows diffuse pancolitis. Lactic acid levels were normal. Patient was admitted for further management.    Consultants:  GI Dr. Evette [name] 10/13/2011  Procedures:  2-D echo 10/13/2011  Acute abdominal series 10/12/2011  CT of the abdomen and pelvis 10/12/2011  Antibiotics:  IV ciprofloxacin 10/13/2011 to 10/14/2011  IV Flagyl 10/13/2011  IV Levaquin 10/14/2011  HPI/Subjective: Sitting up in chair, complaining of headache, no further diarrhea.  Objective: Filed Vitals:   10/18/11 0612 10/18/11 0821 10/18/11 0823 10/18/11 1015  BP: 125/51   141/52  Pulse: 93   97  Temp: 98 F (36.7 C)  97.8 F (36.6 C)  TempSrc: Oral   Oral  Resp: 14   16  Height:        Weight: 58.968 kg (130 lb)     SpO2: 99% 99% 93% 90%    Intake/Output Summary (Last 24 hours) at 10/18/11 1354 Last data filed at 10/18/11 1016  Gross per 24 hour  Intake 1300.25 ml  Output    900 ml  Net 400.25 ml   Filed Weights   10/16/11 0000 10/17/11 0612 10/18/11 0612  Weight: 59.6 kg (131 lb 6.3 oz) 59.421 kg (131 lb) 58.968 kg (130 lb)    Exam:   General:  NAD  Cardiovascular: RRR  Respiratory: Decreased breath sounds at the bases, otherwise clear  Abdomen: Soft/NT/ND/+BS  Extremities: No cyanosis and no edema  Data Reviewed: Basic Metabolic Panel:  Lab 10/18/11 1610 10/16/11 0312 10/15/11 0402 10/14/11 0325 10/13/11 0425  NA 140 141 139 138 135  K 4.2 3.9 3.8 4.2 4.0  CL 102 108 105 105 98  CO2 34* 30 26 29 28   GLUCOSE 108* 108* 94 137* 156*  BUN 6 4* 4* 10 16  CREATININE 0.71 0.58 0.64 0.72 0.69  CALCIUM 8.5 7.6* 7.5* 7.4* 8.0*  MG -- -- -- 1.6 --  PHOS -- -- -- -- --   Liver Function Tests:  Lab 10/14/11 0325 10/13/11 0425 10/12/11 1859  AST 13 17 22   ALT 7 9 12   ALKPHOS 70 115 137*  BILITOT 0.2* 0.2* 0.3  PROT 4.6* 5.6* 6.9  ALBUMIN 2.4* 2.7* 3.7    Lab 10/13/11 0425  LIPASE 9*  AMYLASE --   No results found for this basename: AMMONIA:5 in the last 168 hours CBC:  Lab 10/18/11 0420 10/17/11 0317 10/16/11 0312 10/15/11 0402 10/14/11 0325 10/13/11 0425 10/12/11 1859  WBC 4.6 5.0 4.4 4.7 6.6 -- --  NEUTROABS -- -- -- 3.4 5.2 13.9* 22.1*  HGB 10.4* 9.9* 9.7* 10.6* 10.7* -- --  HCT 32.2* 31.1* 30.5* 33.2* 32.6* -- --  MCV 95.5 95.7 95.9 96.5 95.9 -- --  PLT 175 137* 127* 123* 121* -- --   Cardiac Enzymes:  Lab 10/13/11 1621 10/13/11 1040 10/13/11 0425  CKTOTAL 40 47 --  CKMB 3.9 4.6* --  CKMBINDEX -- -- --  TROPONINI <0.30 <0.30 0.96*   BNP (last 3 results) No results found for this basename: PROBNP:3 in the last 8760 hours CBG: No results found for this basename: GLUCAP:5 in the last 168 hours  Recent Results (from the past  240 hour(s))  CULTURE, BLOOD (ROUTINE X 2)     Status: Normal (Preliminary result)   Collection Time   10/13/11 12:00 AM      Component Value Range Status Comment   Specimen Description BLOOD RIGHT ANTECUBITAL   Final    Special Requests BOTTLES DRAWN AEROBIC AND ANAEROBIC 5 CC EACH   Final    Culture  Setup Time 10/13/2011 14:16   Final    Culture     Final    Value:        BLOOD CULTURE RECEIVED NO GROWTH TO DATE CULTURE WILL BE HELD FOR 5 DAYS BEFORE ISSUING A FINAL NEGATIVE REPORT   Report Status PENDING   Incomplete   CULTURE, BLOOD (ROUTINE X 2)     Status: Normal (Preliminary result)   Collection Time   10/13/11 12:10 AM      Component Value Range Status Comment   Specimen Description BLOOD LEFT HAND   Final  Special Requests BOTTLES DRAWN AEROBIC ONLY 3 CC   Final    Culture  Setup Time 10/13/2011 14:16   Final    Culture     Final    Value:        BLOOD CULTURE RECEIVED NO GROWTH TO DATE CULTURE WILL BE HELD FOR 5 DAYS BEFORE ISSUING A FINAL NEGATIVE REPORT   Report Status PENDING   Incomplete   MRSA PCR SCREENING     Status: Normal   Collection Time   10/13/11  3:28 AM      Component Value Range Status Comment   MRSA by PCR NEGATIVE  NEGATIVE Final   URINE CULTURE     Status: Normal   Collection Time   10/13/11 11:26 AM      Component Value Range Status Comment   Specimen Description URINE, RANDOM   Final    Special Requests NONE   Final    Culture  Setup Time 10/14/2011 02:35   Final    Colony Count NO GROWTH   Final    Culture NO GROWTH   Final    Report Status 10/14/2011 FINAL   Final   CLOSTRIDIUM DIFFICILE BY PCR     Status: Normal   Collection Time   10/14/11  9:07 AM      Component Value Range Status Comment   C difficile by pcr NEGATIVE  NEGATIVE Final      Studies: No results found.  Scheduled Meds:    . budesonide-formoterol  2 puff Inhalation BID  . famotidine  20 mg Oral Daily  . feeding supplement  237 mL Oral BID BM  . fluticasone  2 spray Each  Nare Daily  . gabapentin  100 mg Oral TID  . hyoscyamine  0.125 mg Sublingual Daily  . levofloxacin  500 mg Oral Q48H  . metroNIDAZOLE  500 mg Oral Q8H  . nebivolol  2.5 mg Oral Daily  . sodium chloride  3 mL Intravenous Q12H  . tiotropium  18 mcg Inhalation Daily   Continuous Infusions:    . sodium chloride 200 mL (10/17/11 1753)    Principal Problem:  *SIRS (systemic inflammatory response syndrome) Active Problems:  HYPERTENSION  COPD  GERD  Pancolitis  Headache  Cardiac enzymes elevated  Pulmonary infiltrate in right lung on CXR    Time spent:    Kela Millin  [facility] Pager [phone]. If 8PM-8AM, please contact night-coverage at [email or web address], password [facility] 10/18/2011, 1:54 PM  LOS: 6 days

## 2011-10-19 LAB — CULTURE, BLOOD (ROUTINE X 2)
Culture: NO GROWTH
Culture: NO GROWTH

## 2011-10-19 NOTE — Progress Notes (Signed)
[facility] PROGRESS NOTE  [name] [name] ZOX:[id number] DOB: 1925/03/03 DOA: 10/12/2011 PCP: [name], MD  Assessment/Plan: Principal Problem:  *SIRS (systemic inflammatory response syndrome) Active Problems:  HYPERTENSION  COPD  GERD  Pancolitis  Headache  Cardiac enzymes elevated  Pulmonary infiltrate in right lung on CXR  #1. SIRS secondary to pancolitis -  -C. difficile PCR negative. Stool cultures pending. Patient is currently afebrile. No leukocytosis.  -Change to by mouth Levaquin to treat both of patient's pancolitis and right lung infiltrate. Change to by mouth Flagyl as per GI recommendation- total of 10 days of antibiotics recommended. -Patient continues to improve clinically, tolerating regular diet -No further hypotensive episodes last p.m.  -GI following, Appreciate input and recommendations-patient to follow up in office with Dr. Ewing Schlein as scheduled  -noted CIR final decision -pt denied, discussed this with pt and family- daughter asked for pt to be ambulated prir to d/c and this was done per nsg- she ambulated 97ft with walker and family insisting that someone from CIR physically comes and sees pt prior to making final decision - I called Britta Mccreedy back  And she will call family.  #2 pancolitis -As above, to followup with GI outpatient  #3 right lung infiltrate. Chest x-ray Questionable etiology likely secondary to aspiration pneumonia as patient did have several episodes of emesis prior to admission. Patient denies any shortness of breath. Patient with no productive cough. On oral Levaquin as above. Patient will likely need a repeat chest x-ray done in about 4-6 weeks for resolution of infiltrate.  #3. Leukocytosis probably secondary #1 2 and 3, -resolved with treatment as above  #4. Headache probably precipitated by #1.  Continue Fioricet when necessary  #5. Chronic pain on Dilaudid pump.  Continue pain regimen.  #6. COPD with no  exacerbation -   stable continue home medications.   #7.h/o Hypertension - Hold parameters added to home medications due to hypotension last pm as above.   #8. History of CHF/cor pulmonale -  presently not short of breath and does not look decompensated. IV fluids decreased continue to follow  #9 elevated cardiac enzymes First set of cardiac enzymes were elevated however serial enzymes have been negative. 2-D echo with a normal EF with no wall motion abnormalities. Likely secondary to current infection. No further cardiac workup is needed at this time.  -Patient remains asymptomatic. #10 prophylaxis Pepcid for GI prophylaxis. SCDs for DVT prophylaxis.   Code Status: Full Family Communication: Updated patient. No family at bedside. Disposition Plan: plannind home in am, unless CIR reconsiders   Brief narrative: 78 year-old female with history of COPD on home oxygen, hypertension, history of pancreatitis and had undergone ERCP with sphincterotomy and subsequently cholecystectomy presents with complaints of abdominal pain. The abdominal pain started off early in the morning. The pain has been more in the epigastric area episodic crampy associated nausea no vomiting denies any diarrhea. Has subjective feeling of fever chills. Patient's records show patient has been on antibiotics last month for bronchitis. In addition patient also is complaining of frontal headache which started off same time. Patient denies any blurred vision or focal deficits oral neck pain. In the ER patient was found to be tachycardic with leukocytosis. Patient's CT abdomen pelvis shows diffuse pancolitis. Lactic acid levels were normal. Patient was admitted for further management.    Consultants:  GI Dr. Evette [name] 10/13/2011  Procedures:  2-D echo 10/13/2011  Acute abdominal series 10/12/2011  CT of the abdomen and pelvis 10/12/2011  Antibiotics:  IV ciprofloxacin 10/13/2011 to 10/14/2011  IV Flagyl  10/13/2011  IV Levaquin 10/14/2011  HPI/Subjective: States feeling better, no new c/o, denies diarrhea   Objective: Filed Vitals:   10/19/11 0517 10/19/11 0830 10/19/11 1059 10/19/11 1445  BP: 134/64  125/50 132/62  Pulse: 67  92 90  Temp: 97.4 F (36.3 C)  98.1 F (36.7 C) 98.4 F (36.9 C)  TempSrc: Oral  Oral Oral  Resp: 18  16 18   Height:      Weight: 59.875 kg (132 lb)     SpO2: 100% 96% 97% 99%    Intake/Output Summary (Last 24 hours) at 10/19/11 1559 Last data filed at 10/19/11 1500  Gross per 24 hour  Intake    720 ml  Output    430 ml  Net    290 ml   Filed Weights   10/17/11 0612 10/18/11 0612 10/19/11 0517  Weight: 59.421 kg (131 lb) 58.968 kg (130 lb) 59.875 kg (132 lb)    Exam:   General:  NAD  Cardiovascular: RRR  Respiratory: Decreased breath sounds at the bases, otherwise clear. No crackles  Abdomen: Soft/NT/ND/+BS  Extremities: No cyanosis and no edema  Data Reviewed: Basic Metabolic Panel:  Lab 10/18/11 4098 10/16/11 0312 10/15/11 0402 10/14/11 0325 10/13/11 0425  NA 140 141 139 138 135  K 4.2 3.9 3.8 4.2 4.0  CL 102 108 105 105 98  CO2 34* 30 26 29 28   GLUCOSE 108* 108* 94 137* 156*  BUN 6 4* 4* 10 16  CREATININE 0.71 0.58 0.64 0.72 0.69  CALCIUM 8.5 7.6* 7.5* 7.4* 8.0*  MG -- -- -- 1.6 --  PHOS -- -- -- -- --   Liver Function Tests:  Lab 10/14/11 0325 10/13/11 0425 10/12/11 1859  AST 13 17 22   ALT 7 9 12   ALKPHOS 70 115 137*  BILITOT 0.2* 0.2* 0.3  PROT 4.6* 5.6* 6.9  ALBUMIN 2.4* 2.7* 3.7    Lab 10/13/11 0425  LIPASE 9*  AMYLASE --   No results found for this basename: AMMONIA:5 in the last 168 hours CBC:  Lab 10/18/11 0420 10/17/11 0317 10/16/11 0312 10/15/11 0402 10/14/11 0325 10/13/11 0425 10/12/11 1859  WBC 4.6 5.0 4.4 4.7 6.6 -- --  NEUTROABS -- -- -- 3.4 5.2 13.9* 22.1*  HGB 10.4* 9.9* 9.7* 10.6* 10.7* -- --  HCT 32.2* 31.1* 30.5* 33.2* 32.6* -- --  MCV 95.5 95.7 95.9 96.5 95.9 -- --  PLT 175 137* 127*  123* 121* -- --   Cardiac Enzymes:  Lab 10/13/11 1621 10/13/11 1040 10/13/11 0425  CKTOTAL 40 47 --  CKMB 3.9 4.6* --  CKMBINDEX -- -- --  TROPONINI <0.30 <0.30 0.96*   BNP (last 3 results) No results found for this basename: PROBNP:3 in the last 8760 hours CBG: No results found for this basename: GLUCAP:5 in the last 168 hours  Recent Results (from the past 240 hour(s))  CULTURE, BLOOD (ROUTINE X 2)     Status: Normal   Collection Time   10/13/11 12:00 AM      Component Value Range Status Comment   Specimen Description BLOOD RIGHT ANTECUBITAL   Final    Special Requests BOTTLES DRAWN AEROBIC AND ANAEROBIC 5 CC EACH   Final    Culture  Setup Time 10/13/2011 14:16   Final    Culture NO GROWTH 5 DAYS   Final    Report Status 10/19/2011 FINAL   Final   CULTURE, BLOOD (ROUTINE X  2)     Status: Normal   Collection Time   10/13/11 12:10 AM      Component Value Range Status Comment   Specimen Description BLOOD LEFT HAND   Final    Special Requests BOTTLES DRAWN AEROBIC ONLY 3 CC   Final    Culture  Setup Time 10/13/2011 14:16   Final    Culture NO GROWTH 5 DAYS   Final    Report Status 10/19/2011 FINAL   Final   MRSA PCR SCREENING     Status: Normal   Collection Time   10/13/11  3:28 AM      Component Value Range Status Comment   MRSA by PCR NEGATIVE  NEGATIVE Final   URINE CULTURE     Status: Normal   Collection Time   10/13/11 11:26 AM      Component Value Range Status Comment   Specimen Description URINE, RANDOM   Final    Special Requests NONE   Final    Culture  Setup Time 10/14/2011 02:35   Final    Colony Count NO GROWTH   Final    Culture NO GROWTH   Final    Report Status 10/14/2011 FINAL   Final   CLOSTRIDIUM DIFFICILE BY PCR     Status: Normal   Collection Time   10/14/11  9:07 AM      Component Value Range Status Comment   C difficile by pcr NEGATIVE  NEGATIVE Final      Studies: No results found.  Scheduled Meds:    . budesonide-formoterol  2 puff  Inhalation BID  . famotidine  20 mg Oral Daily  . feeding supplement  237 mL Oral BID BM  . fluticasone  2 spray Each Nare Daily  . gabapentin  100 mg Oral TID  . hyoscyamine  0.125 mg Sublingual Daily  . levofloxacin  500 mg Oral Q48H  . metroNIDAZOLE  500 mg Oral Q8H  . nebivolol  2.5 mg Oral Daily  . sodium chloride  3 mL Intravenous Q12H  . tiotropium  18 mcg Inhalation Daily   Continuous Infusions:    . sodium chloride 30 mL/hr (10/19/11 0848)    Principal Problem:  *SIRS (systemic inflammatory response syndrome) Active Problems:  HYPERTENSION  COPD  GERD  Pancolitis  Headache  Cardiac enzymes elevated  Pulmonary infiltrate in right lung on CXR    Time spent:    Kela Millin  [facility] Pager [phone]. If 8PM-8AM, please contact night-coverage at [email or web address], password [facility] 10/19/2011, 3:59 PM  LOS: 7 days

## 2011-10-19 NOTE — Progress Notes (Signed)
[name] [name] 11:28 AM  Subjective: The patient is doing better from a GI standpoint is eating okay without pain and has not had any diarrhea today  Objective: Vital signs stable afebrile no acute distress abdomen is soft nontender labs stable  Assessment: Multiple medical problems currently improved from a GI standpoint  Plan: I discussed the case with her daughter and her self and please call us if we could be of any further assistance this hospital stay otherwise she has a followup with me in October and happy to see back sooner when necessary  [facility] E

## 2011-10-19 NOTE — Progress Notes (Signed)
Feel patient will be able to go home with home health when medically ready. Do not feel we can justify an inpatient rehab admission. Please call with any questions. [phone].

## 2011-10-20 LAB — BASIC METABOLIC PANEL
BUN: 8 mg/dL (ref 6–23)
CO2: 35 mEq/L — ABNORMAL HIGH (ref 19–32)
Calcium: 8.6 mg/dL (ref 8.4–10.5)
Chloride: 102 mEq/L (ref 96–112)
Creatinine, Ser: 0.59 mg/dL (ref 0.50–1.10)
GFR calc Af Amer: >90 mL/min (ref >90)
GFR calc non Af Amer: 82 mL/min — ABNORMAL LOW (ref >90)
Glucose, Bld: 106 mg/dL — ABNORMAL HIGH (ref 70–99)
Potassium: 3.9 mEq/L (ref 3.5–5.1)
Sodium: 142 mEq/L (ref 135–145)

## 2011-10-20 LAB — CBC
HCT: 31.1 % — ABNORMAL LOW (ref 36.0–46.0)
Hemoglobin: 10 g/dL — ABNORMAL LOW (ref 12.0–15.0)
MCH: 30.8 pg (ref 26.0–34.0)
MCHC: 32.2 g/dL (ref 30.0–36.0)
MCV: 95.7 fL (ref 78.0–100.0)
Platelets: 218 10*3/uL (ref 150–400)
RBC: 3.25 MIL/uL — ABNORMAL LOW (ref 3.87–5.11)
RDW: 13.7 % (ref 11.5–15.5)
WBC: 5 10*3/uL (ref 4.0–10.5)

## 2011-10-20 MED ORDER — HYDROMORPHONE HCL 2 MG PO TABS: 2.0000 mg | ORAL_TABLET | ORAL | Status: DC | PRN

## 2011-10-20 MED ORDER — LEVOFLOXACIN 500 MG PO TABS: 500.0000 mg | ORAL_TABLET | ORAL | Status: DC

## 2011-10-20 MED ORDER — METRONIDAZOLE 500 MG PO TABS: 500.0000 mg | ORAL_TABLET | Freq: Three times a day (TID) | ORAL | Status: DC

## 2011-10-20 NOTE — Progress Notes (Signed)
Pt discharged to home with daughter.  IV discontinued prior to discharge.  Discharge paperwork reviewed and signed prior to discharge.  Prescriptions provided. [name] [name]

## 2011-10-20 NOTE — Progress Notes (Signed)
I contacted pt's daughter, [name] [name] yesterday and left a message for her to call me to discuss why her Mother was not a candidate for inpt rehab. Pt medically does not require hospital based rehab. Daughter called me back this morning and we discussed rehab decision. Daughter is in agreement to d/c home with family and requests HH PT and OT. She does not want SNF rehab at this time. Please call me for any questions. [phone].

## 2011-10-20 NOTE — Discharge Summary (Signed)
Physician Discharge Summary  [name] [name] ZOX:[id number] DOB: [birth year] DOA: 10/12/2011  PCP: [name], MD  Admit date: 10/12/2011 Discharge date: 10/20/2011  Recommendations for Outpatient Follow-up:      Follow-up Information    Follow up with Letitia Libra, Ala Dach, MD. (in 1-2weeks)    Contact information:   [address] [place] [address] [phone]       Follow up with [facility] E, MD. (as scheduled)    Contact information:   [address] [place] [address] [phone]           Discharge Diagnoses:  Principal Problem:  *SIRS (systemic inflammatory response syndrome) Active Problems:  HYPERTENSION  COPD  GERD  Pancolitis  Headache  Cardiac enzymes elevated  Pulmonary infiltrate in right lung on CXR   Discharge Condition: Improved/stable  Diet recommendation: Regular  Filed Weights   10/18/11 0612 10/19/11 0517 10/20/11 0517  Weight: 58.968 kg (130 lb) 59.875 kg (132 lb) 60.328 kg (133 lb)    History of present illness:  78 year-old female with history of COPD on home oxygen, hypertension, history of pancreatitis and had undergone ERCP with sphincterotomy and subsequently cholecystectomy presents with complaints of abdominal pain. The abdominal pain started off early in the morning. The pain has been more in the epigastric area episodic crampy associated nausea no vomiting denies any diarrhea. Has subjective feeling of fever chills. Patient's records show patient has been on antibiotics last month for bronchitis. In addition patient also is complaining of frontal headache which started off same time. Patient denies any blurred vision or focal deficits oral neck pain. In the ER patient was found to be tachycardic with leukocytosis. Patient's CT abdomen pelvis shows diffuse pancolitis. Lactic acid levels were normal. Patient was admitted for further  management   Hospital Course by problem list:  #1. SIRS secondary to pancolitis -  -Upon admission patient had C. difficile PCR negative, and date although school stool cultures were ordered still no results at this time.Patient improved clinically currently afebrile. No leukocytosis.  -She was changed to Change to by mouth Levaquin to treat both of patient's pancolitis and right lung infiltrate. Change to by mouth Flagyl as per GI recommendation- total of 10 days of antibiotics recommended.  -GI was consulted and followed patient in the hospital and as a she improved she was started on a diet which was advanced and she has been tolerating a regular diet  -GI following, Appreciate input and recommendations-patient to follow up in office with Dr. Ewing Schlein as scheduled  -PT was consulted and recommended inpatient rehabilitation, CIR was consulted and as stated the patient does not meet criteria for admission for rehabilitation at this time. She'll be discharged home with home health PT OT. #2 pancolitis  -As above, to followup with GI outpatient with Dr. Ozella Rocks in October as scheduled  #3 right lung infiltrate. Chest x-ray  Questionable etiology likely secondary to aspiration pneumonia as patient did have several episodes of emesis prior to admission. She was placed on empiric antibiotics and improved clinically -Patient denies any shortness of breath. Patient with no productive cough. She's being discharged on oral Levaquin as above to complete the treatment course. Patient will likely need a repeat chest x-ray done in about 4-6 weeks for resolution of infiltrate.  #3. Leukocytosis probably secondary #1 2 and 3, -resolved with treatment as above  #4. Headache probably precipitated by #1.  Continue Fioricet  when necessary  #5. Chronic pain on Dilaudid pump.  Continue pain regimen.  #6. COPD with no exacerbation -  stable continue home medications.  #7.h/o Hypertension -  Hold parameters added to  home medications due to hypotension last pm as above.  #8. History of CHF/cor pulmonale -  presently not short of breath and does not look decompensated. Her fluid status was monitored and she's not had any evidence of fluid overload. #9 elevated cardiac enzymes  First set of cardiac enzymes were elevated however serial enzymes have been negative. 2-D echo with a normal EF with no wall motion abnormalities. Likely secondary to current infection. No further cardiac workup is needed at this time.  -Patient remains asymptomatic.  #10 prophylaxis  Pepcid for GI prophylaxis. SCDs for DVT prophylaxis.  Consultants:  GI Dr. Evette [name] 10/13/2011 Procedures:  2-D echo 10/13/2011  Acute abdominal series 10/12/2011  CT of the abdomen and pelvis 10/12/2011     Discharge Exam: Filed Vitals:   10/19/11 2120 10/20/11 0202 10/20/11 0517 10/20/11 0822  BP:  116/58 111/54   Pulse: 91 92 90   Temp:  98.4 F (36.9 C) 97.6 F (36.4 C)   TempSrc:  Oral Oral   Resp: 18 16 16    Height:      Weight:   60.328 kg (133 lb)   SpO2: 97% 97% 100% 92%   Exam:  General: NAD  Cardiovascular: RRR  Respiratory: Decreased breath sounds at the bases, otherwise clear. No crackles  Abdomen: Soft/NT/ND/+BS  Extremities: No cyanosis and no edema    Discharge Instructions  Discharge Orders    Future Appointments: Provider: Department: Dept Phone: Center:   11/10/2011 2:00 PM Storm Frisk, MD [facility] [phone] None   11/11/2011 1:15 PM Edwyna Perfect, MD [facility] [phone] [facility]     Future Orders Please Complete By Expires   Diet general      Increase activity slowly          Medication List     As of 10/20/2011 11:26 AM    STOP taking these medications         aspirin 81 MG tablet      TAKE these medications         budesonide-formoterol 160-4.5 MCG/ACT inhaler   Commonly known as: SYMBICORT   Inhale 2 puffs into the lungs 2 (two) times daily.      CALCIUM  600-D 600-400 MG-UNIT per tablet   Generic drug: Calcium Carbonate-Vitamin D   Take 1 tablet by mouth 2 (two) times daily with a meal.      cyclobenzaprine 10 MG tablet   Commonly known as: FLEXERIL   Take 1 tablet by mouth 3 (three) times daily as needed.      diclofenac sodium 1 % Gel   Commonly known as: VOLTAREN   Apply 1 application topically 4 (four) times daily as needed. Inflammation      ferrous fumarate-iron polysaccharide complex 162-115.2 MG Caps   Commonly known as: TANDEM   Take 1 capsule by mouth daily.      FIORICET 50-325-40 MG per tablet   Generic drug: butalbital-acetaminophen-caffeine   Take 1 tablet by mouth every 8 (eight) hours as needed. Migraine      fluticasone 50 MCG/ACT nasal spray   Commonly known as: FLONASE      gabapentin 100 MG capsule   Commonly known as: NEURONTIN   Take 100 mg by mouth 3 (three) times daily.  HYDROmorphone 2 MG tablet   Commonly known as: DILAUDID   Take 1 tablet (2 mg total) by mouth as needed. pain      hyoscyamine 0.125 MG Tbdp   Commonly known as: ANASPAZ   Place 0.125 mg under the tongue daily.      Lactulose Soln   Take 2 mLs by mouth every evening.      levofloxacin 500 MG tablet   Commonly known as: LEVAQUIN   Take 1 tablet (500 mg total) by mouth every other day.      metroNIDAZOLE 500 MG tablet   Commonly known as: FLAGYL   Take 1 tablet (500 mg total) by mouth every 8 (eight) hours.      nebivolol 5 MG tablet   Commonly known as: BYSTOLIC   Take 5 mg by mouth daily. Take 1/2 tablet by mouth daily.      nitroGLYCERIN 0.4 MG SL tablet   Commonly known as: NITROSTAT   Place 1 tablet (0.4 mg total) under the tongue every 5 (five) minutes as needed for chest pain.      NON FORMULARY   OXYGEN. 2L at night      omeprazole 20 MG capsule   Commonly known as: PRILOSEC   Take 2 capsules (40 mg total) by mouth daily.      promethazine 25 MG tablet   Commonly known as: PHENERGAN   Take 25 mg by mouth  as needed. Nausea      tiotropium 18 MCG inhalation capsule   Commonly known as: SPIRIVA   Place 18 mcg into inhaler and inhale daily.      VENTOLIN HFA 108 (90 BASE) MCG/ACT inhaler   Generic drug: albuterol   Inhale 1 puff into the lungs 3 (three) times daily as needed.      vitamin C 500 MG tablet   Commonly known as: ASCORBIC ACID   Take 500 mg by mouth daily.      Vitamin D (Ergocalciferol) 50000 UNITS Caps   Commonly known as: DRISDOL   Take 50,000 Units by mouth every 7 (seven) days.           Follow-up Information    Follow up with Letitia Libra, Ala Dach, MD. (in 1-2weeks)    Contact information:   [address] [place] [address] [phone]       Follow up with [facility] E, MD. (as scheduled)    Contact information:   [address] [place] [address] [phone]           The results of significant diagnostics from this hospitalization (including imaging, microbiology, ancillary and laboratory) are listed below for reference.    Significant Diagnostic Studies: Ct Head Wo Contrast  10/12/2011  *RADIOLOGY REPORT*  Clinical Data: Abdominal pain with nausea, emesis, and headache  CT HEAD WITHOUT CONTRAST  Technique:  Contiguous axial images were obtained from the base of the skull through the vertex without contrast.  Comparison: 12/02/2009  Findings: There is no evidence for acute infarction, intracranial hemorrhage, mass lesion, hydrocephalus, or extra-axial fluid. Slight atrophy.  Mild chronic microvascular ischemic change. Intact calvarium.  Clear sinuses and mastoids.  Bilateral cataract extraction.  IMPRESSION: Mild atrophy with chronic microvascular ischemic change.  No acute stroke or hemorrhage.   Original Report Authenticated By: Elsie Stain, M.D.    Ct Abdomen Pelvis W Contrast  10/12/2011  *RADIOLOGY REPORT*  Clinical Data: Abdominal pain.  Nausea and  vomiting.  CT ABDOMEN AND  PELVIS WITH CONTRAST  Technique:  Multidetector CT imaging of the abdomen and pelvis was performed following the standard protocol during bolus administration of intravenous contrast.  Contrast: OMNIPAQUE IOHEXOL 300 MG/ML  SOLN  Comparison: Chest and two views abdomen earlier this same date.  CT abdomen and pelvis 03/02/2010.  Findings: There is no pleural or pericardial effusion.  The lung bases demonstrate some dependent atelectasis.  Pneumobilia is identified as on the prior examination.  The patient is status post cholecystectomy.  The spleen, adrenal glands, kidneys and pancreas appear normal.  There is diffuse wall thickening of the colon.  No pneumatosis, portal venous gas or free intraperitoneal air is identified.  A small amount of free pelvic fluid is seen.  The urinary bladder is incompletely distended.  The walls appears hyperenhancing.  No lymphadenopathy is identified.  The patient has severe convex right scoliosis.  Spinal stimulator device is noted.  IMPRESSION:  1.  Pancolitis. 2.  Incompletely distended urinary bladder demonstrates an enhancing wall and possible wall thickening worrisome for cystitis. 3.  Unchanged pneumobilia is consistent with prior sphincterotomy.   Original Report Authenticated By: Bernadene Bell. D'ALESSIO, M.D.    Dg Abd Acute W/chest  10/12/2011  *RADIOLOGY REPORT*  Clinical Data: Abdominal pain with vomiting and diarrhea.  ACUTE ABDOMEN SERIES (ABDOMEN 2 VIEW & CHEST 1 VIEW)  Comparison: Chest radiographs 11/12/2010 an acute abdominal series 03/01/2010.  Findings: The heart size is stable.  There is new right hilar fullness which may reflect a perihilar infiltrate or ill-defined mass.  The left lung is clear.  There is no significant pleural effusion.  A thoracic spinal stimulator is noted.  The abdomen is nearly gasless.  There is no free intraperitoneal air or evidence of significantly distended bowel.  The patient has a severe convex right scoliosis with associated  spondylosis. Cholecystectomy clips are noted.  An intrathecal spinal pump and its catheter appear intact.  IMPRESSION:  1.  New right perihilar infiltrate or ill-defined mass.  Correlate clinically.  This needs radiographic followup to exclude neoplasm. 2.  Nearly gasless abdomen without evidence of obstruction or free air.   Original Report Authenticated By: Gerrianne Scale, M.D.     Microbiology: Recent Results (from the past 240 hour(s))  CULTURE, BLOOD (ROUTINE X 2)     Status: Normal   Collection Time   10/13/11 12:00 AM      Component Value Range Status Comment   Specimen Description BLOOD RIGHT ANTECUBITAL   Final    Special Requests BOTTLES DRAWN AEROBIC AND ANAEROBIC 5 CC EACH   Final    Culture  Setup Time 10/13/2011 14:16   Final    Culture NO GROWTH 5 DAYS   Final    Report Status 10/19/2011 FINAL   Final   CULTURE, BLOOD (ROUTINE X 2)     Status: Normal   Collection Time   10/13/11 12:10 AM      Component Value Range Status Comment   Specimen Description BLOOD LEFT HAND   Final    Special Requests BOTTLES DRAWN AEROBIC ONLY 3 CC   Final    Culture  Setup Time 10/13/2011 14:16   Final    Culture NO GROWTH 5 DAYS   Final    Report Status 10/19/2011 FINAL   Final   MRSA PCR SCREENING     Status: Normal   Collection Time   10/13/11  3:28 AM      Component Value  Range Status Comment   MRSA by PCR NEGATIVE  NEGATIVE Final   URINE CULTURE     Status: Normal   Collection Time   10/13/11 11:26 AM      Component Value Range Status Comment   Specimen Description URINE, RANDOM   Final    Special Requests NONE   Final    Culture  Setup Time 10/14/2011 02:35   Final    Colony Count NO GROWTH   Final    Culture NO GROWTH   Final    Report Status 10/14/2011 FINAL   Final   CLOSTRIDIUM DIFFICILE BY PCR     Status: Normal   Collection Time   10/14/11  9:07 AM      Component Value Range Status Comment   C difficile by pcr NEGATIVE  NEGATIVE Final      Labs: Basic Metabolic  Panel:  Lab 10/20/11 0348 10/18/11 0420 10/16/11 0312 10/15/11 0402 10/14/11 0325  NA 142 140 141 139 138  K 3.9 4.2 3.9 3.8 4.2  CL 102 102 108 105 105  CO2 35* 34* 30 26 29   GLUCOSE 106* 108* 108* 94 137*  BUN 8 6 4* 4* 10  CREATININE 0.59 0.71 0.58 0.64 0.72  CALCIUM 8.6 8.5 7.6* 7.5* 7.4*  MG -- -- -- -- 1.6  PHOS -- -- -- -- --   Liver Function Tests:  Lab 10/14/11 0325  AST 13  ALT 7  ALKPHOS 70  BILITOT 0.2*  PROT 4.6*  ALBUMIN 2.4*   No results found for this basename: LIPASE:5,AMYLASE:5 in the last 168 hours No results found for this basename: AMMONIA:5 in the last 168 hours CBC:  Lab 10/20/11 0348 10/18/11 0420 10/17/11 0317 10/16/11 0312 10/15/11 0402 10/14/11 0325  WBC 5.0 4.6 5.0 4.4 4.7 --  NEUTROABS -- -- -- -- 3.4 5.2  HGB 10.0* 10.4* 9.9* 9.7* 10.6* --  HCT 31.1* 32.2* 31.1* 30.5* 33.2* --  MCV 95.7 95.5 95.7 95.9 96.5 --  PLT 218 175 137* 127* 123* --   Cardiac Enzymes:  Lab 10/13/11 1621  CKTOTAL 40  CKMB 3.9  CKMBINDEX --  TROPONINI <0.30   BNP: BNP (last 3 results) No results found for this basename: PROBNP:3 in the last 8760 hours CBG: No results found for this basename: GLUCAP:5 in the last 168 hours  Time coordinating discharge: >30 minutes  Signed:  Annisten Manchester C  [facility] 10/20/2011, 11:26 AM

## 2011-10-20 NOTE — Progress Notes (Signed)
Physical Therapy Treatment Patient Details Name: [name] [name] MRN: [id number] DOB: 05-29-1925 Today's Date: 10/20/2011 Time: 8295-6213 PT Time Calculation (min): 24 min  PT Assessment / Plan / Recommendation Comments on Treatment Session  pt doing better overall today; dtr states she feels compfortable with pt at current status    Follow Up Recommendations  Home health PT;Inpatient Rehab;Supervision/Assistance - 24 hour    Barriers to Discharge        Equipment Recommendations  None recommended by PT    Recommendations for Other Services    Frequency Min 3X/week   Plan Discharge plan remains appropriate;Frequency remains appropriate    Precautions / Restrictions     Pertinent Vitals/Pain     Mobility  Bed Mobility Bed Mobility: Supine to Sit Supine to Sit: 6: Modified independent (Device/Increase time) Transfers Transfers: Sit to Stand;Stand to Sit Sit to Stand: 4: Min guard;With upper extremity assist Stand to Sit: 4: Min guard Details for Transfer Assistance: cues for hand placement and safety Ambulation/Gait Ambulation/Gait Assistance: 4: Min guard;5: Supervision Ambulation Distance (Feet): 65 Feet Assistive device: Rolling walker Ambulation/Gait Assistance Details: improved stability with RW, cues for RW distance from self Gait Pattern: Decreased step length - right;Decreased step length - left Gait velocity: decreased General Gait Details: O2 sats >95% on 2L O2 this am; HR 105-107    Exercises General Exercises - Lower Extremity Ankle Circles/Pumps: AROM;Both;10 reps Long Arc Quad: AROM;Both;10 reps Other Exercises Other Exercises: isometric hip AB/ADDUCtion   PT Diagnosis:    PT Problem List:   PT Treatment Interventions:     PT Goals Acute Rehab PT Goals Time For Goal Achievement: 10/31/11 Potential to Achieve Goals: Good Pt will go Supine/Side to Sit: with supervision PT Goal: Supine/Side to Sit - Progress: Met Pt will go Sit to Stand: with  supervision PT Goal: Sit to Stand - Progress: Progressing toward goal Pt will go Stand to Sit: with supervision PT Goal: Stand to Sit - Progress: Progressing toward goal Pt will Ambulate: 51 - 150 feet;with modified independence;with least restrictive assistive device PT Goal: Ambulate - Progress: Progressing toward goal  Visit Information  Last PT Received On: 10/20/11 Assistance Needed: +1    Subjective Data  Patient Stated Goal: home and pt be able to walk in house   Cognition  Overall Cognitive Status: Appears within functional limits for tasks assessed/performed Arousal/Alertness: Awake/alert Orientation Level: Appears intact for tasks assessed Behavior During Session: [facility] for tasks performed    Balance     End of Session PT - End of Session Equipment Utilized During Treatment: Gait belt;Oxygen Activity Tolerance: Patient tolerated treatment well Patient left: in bed;with call bell/phone within reach;with family/visitor present   GP     [facility] 10/20/2011, 12:51 PM

## 2011-10-21 ENCOUNTER — Ambulatory Visit: Payer: Medicare Other | Admitting: Critical Care Medicine

## 2011-10-21 DIAGNOSIS — M545 Low back pain, unspecified: Secondary | ICD-10-CM | POA: Diagnosis not present

## 2011-10-21 DIAGNOSIS — G894 Chronic pain syndrome: Secondary | ICD-10-CM | POA: Diagnosis not present

## 2011-10-21 DIAGNOSIS — Z462 Encounter for fitting and adjustment of other devices related to nervous system and special senses: Secondary | ICD-10-CM | POA: Diagnosis not present

## 2011-10-21 DIAGNOSIS — M5137 Other intervertebral disc degeneration, lumbosacral region: Secondary | ICD-10-CM | POA: Diagnosis not present

## 2011-10-31 ENCOUNTER — Encounter: Payer: Self-pay | Admitting: Internal Medicine

## 2011-10-31 ENCOUNTER — Ambulatory Visit (INDEPENDENT_AMBULATORY_CARE_PROVIDER_SITE_OTHER): Payer: Medicare Other | Admitting: Internal Medicine

## 2011-10-31 VITALS — BP 94/62 | HR 59 | Temp 98.2°F | Resp 14 | Wt 116.0 lb

## 2011-10-31 DIAGNOSIS — I10 Essential (primary) hypertension: Secondary | ICD-10-CM | POA: Diagnosis not present

## 2011-10-31 DIAGNOSIS — R9389 Abnormal findings on diagnostic imaging of other specified body structures: Secondary | ICD-10-CM | POA: Insufficient documentation

## 2011-10-31 DIAGNOSIS — K5100 Ulcerative (chronic) pancolitis without complications: Secondary | ICD-10-CM | POA: Diagnosis not present

## 2011-10-31 DIAGNOSIS — R918 Other nonspecific abnormal finding of lung field: Secondary | ICD-10-CM | POA: Diagnosis not present

## 2011-10-31 DIAGNOSIS — Z23 Encounter for immunization: Secondary | ICD-10-CM | POA: Diagnosis not present

## 2011-10-31 NOTE — Assessment & Plan Note (Signed)
May represent aspiration pneumonia. Recommend followup chest x-ray to be performed in approximately 2 and a half to three weeks. Schedule followup after that time

## 2011-10-31 NOTE — Assessment & Plan Note (Signed)
Blood pressure low normal. Hold the bystolic for five days. Monitor blood pressure during that time and report results for review if not normal.

## 2011-10-31 NOTE — Progress Notes (Signed)
Subjective:    Patient ID: [name] [name], female    DOB: 05/10/1925, 78 y.o.   MRN: [id number]  HPI patient presents to clinic for hospital followup of pancolitis. Hospital records and discharge summary reviewed. Admitted with abdominal pain and SIRS. Felt to have pancolitis with negative C. difficile toxin. Initially took Levaquin and was transitioned to Flagyl for a total ten days which is completed. Was evaluated by GI during hospitalization with recommendation for outpatient followup. Family is unaware of GI appointment scheduled. Chest x-ray also performed her hospitalization and hospitalist felt was consistent with possible right-sided infiltrate with recommendation for followup chest x-ray approximately 4-6 weeks later. As patient was having episodes of emesis there is some clinical concern that the infiltrate may represent aspiration. After discharge from hospital patient had no further abdominal pain nausea vomiting but did have episode of emesis yesterday which was isolated and transient. Denies fever chills. Blood pressure low normal rechecked to be a systolic blood pressure one hundred. Denies dizziness presyncope or syncope.  Past Medical History  Diagnosis Date  . Osteoporosis   . Arthritis     osteoarthritis  . GERD (gastroesophageal reflux disease)   . Hiatal hernia   . COPD (chronic obstructive pulmonary disease)   . Pneumonia   . Hypotension   . PUD (peptic ulcer disease)   . Cardiac arrhythmia   . Hyperlipidemia   . Heart failure   . Hypertension   . Scoliosis deformity of spine     history of intractable back pain  . Choledocholithiasis   . Pancolitis     Infectious vs. inflammatory  . Abdominal pain     Resolved  . Tachycardia   . Chronic anemia   . Fecal impaction   . Chronic back pain   . Pancreatitis     History of  . CHF (congestive heart failure)    Past Surgical History  Procedure Date  . Cholecystectomy 2008  . Abdominal hysterectomy 1970s  .  Spine surgery [phone]  . Neck surgery 1970s  . Spinal cord stimulator implant   . Ercp w/ sphicterotomy 02/2010  . Back surgery     x 5    reports that she quit smoking about 23 years ago. Her smoking use included Cigarettes. She has a 12 pack-year smoking history. She has never used smokeless tobacco. She reports that she does not drink alcohol or use illicit drugs. family history includes Cancer in her mother; Heart disease in her father; and Hypertension in her other. Allergies  Allergen Reactions  . Codeine     REACTION: n/v/d, HA  . Morphine     REACTION: n/v/d, HA  . Sulfa Antibiotics   . Zoledronic Acid     REACTION: Severe edema     Review of Systems see history of present illness     Objective:   Physical Exam  Nursing note and vitals reviewed. Constitutional: She appears well-developed and well-nourished. No distress.  HENT:  Head: Normocephalic and atraumatic.  Left Ear: External ear normal.  Eyes: Conjunctivae normal are normal. No scleral icterus.  Neck: Neck supple. No JVD present.  Cardiovascular: Normal rate, regular rhythm and normal heart sounds.   Pulmonary/Chest: Effort normal and breath sounds normal.  Abdominal: Soft. Bowel sounds are normal. She exhibits no distension and no mass. There is tenderness in the left lower quadrant. There is no rebound and no guarding.       Mild left lower quadrant tenderness to palpation. No rebound guarding or  rigidity.  Skin: She is not diaphoretic.          Assessment & Plan:

## 2011-10-31 NOTE — Patient Instructions (Addendum)
Please return to the [facility] radiology center in approximately 3 weeks for a repeat chest xray.

## 2011-10-31 NOTE — Assessment & Plan Note (Signed)
Completed course of Flagyl. Schedule GI followup as discussed during hospitalization.

## 2011-11-10 ENCOUNTER — Ambulatory Visit: Payer: Medicare Other | Admitting: Critical Care Medicine

## 2011-11-10 ENCOUNTER — Encounter: Payer: Self-pay | Admitting: Internal Medicine

## 2011-11-10 ENCOUNTER — Ambulatory Visit (HOSPITAL_BASED_OUTPATIENT_CLINIC_OR_DEPARTMENT_OTHER)
Admission: RE | Admit: 2011-11-10 | Discharge: 2011-11-10 | Disposition: A | Discharge status: Home / Self Care | Payer: Medicare Other | Source: Ambulatory Visit | Attending: Internal Medicine | Admitting: Internal Medicine

## 2011-11-10 ENCOUNTER — Ambulatory Visit (INDEPENDENT_AMBULATORY_CARE_PROVIDER_SITE_OTHER): Payer: Medicare Other | Admitting: Internal Medicine

## 2011-11-10 VITALS — BP 110/60 | HR 84 | Temp 98.2°F | Resp 16 | Wt 115.0 lb

## 2011-11-10 DIAGNOSIS — I10 Essential (primary) hypertension: Secondary | ICD-10-CM | POA: Insufficient documentation

## 2011-11-10 DIAGNOSIS — J4489 Other specified chronic obstructive pulmonary disease: Secondary | ICD-10-CM | POA: Insufficient documentation

## 2011-11-10 DIAGNOSIS — J069 Acute upper respiratory infection, unspecified: Secondary | ICD-10-CM

## 2011-11-10 DIAGNOSIS — J449 Chronic obstructive pulmonary disease, unspecified: Secondary | ICD-10-CM | POA: Diagnosis not present

## 2011-11-10 DIAGNOSIS — R9389 Abnormal findings on diagnostic imaging of other specified body structures: Secondary | ICD-10-CM

## 2011-11-10 DIAGNOSIS — J90 Pleural effusion, not elsewhere classified: Secondary | ICD-10-CM | POA: Insufficient documentation

## 2011-11-10 MED ORDER — BENZONATATE 100 MG PO CAPS: 100.0000 mg | ORAL_CAPSULE | Freq: Two times a day (BID) | ORAL | Status: DC | PRN

## 2011-11-10 NOTE — Progress Notes (Signed)
  Subjective:    Patient ID: [name] [name], female    DOB: 1925/07/31, 78 y.o.   MRN: [id number]  HPI patient presents to clinic as a work in for evaluation of cough. Notes one day history of cough without productive sputum or hemoptysis. Leishmania head brief minimal wheezing last night but overall denies affect and breathing including shortness of breath or persistent wheezing. Requests something for cough. Wishes to avoid antibiotics currently if at all possible. History of abnormal chest x-ray with possible right infiltrate. Is performing followup chest x-ray today.  Past Medical History  Diagnosis Date  . Osteoporosis   . Arthritis     osteoarthritis  . GERD (gastroesophageal reflux disease)   . Hiatal hernia   . COPD (chronic obstructive pulmonary disease)   . Pneumonia   . Hypotension   . PUD (peptic ulcer disease)   . Cardiac arrhythmia   . Hyperlipidemia   . Heart failure   . Hypertension   . Scoliosis deformity of spine     history of intractable back pain  . Choledocholithiasis   . Pancolitis     Infectious vs. inflammatory  . Abdominal pain     Resolved  . Tachycardia   . Chronic anemia   . Fecal impaction   . Chronic back pain   . Pancreatitis     History of  . CHF (congestive heart failure)    Past Surgical History  Procedure Date  . Cholecystectomy 2008  . Abdominal hysterectomy 1970s  . Spine surgery [phone]  . Neck surgery 1970s  . Spinal cord stimulator implant   . Ercp w/ sphicterotomy 02/2010  . Back surgery     x 5    reports that she quit smoking about 23 years ago. Her smoking use included Cigarettes. She has a 12 pack-year smoking history. She has never used smokeless tobacco. She reports that she does not drink alcohol or use illicit drugs. family history includes Cancer in her mother; Heart disease in her father; and Hypertension in her other. Allergies  Allergen Reactions  . Codeine     REACTION: n/v/d, HA  . Morphine    REACTION: n/v/d, HA  . Sulfa Antibiotics   . Zoledronic Acid     REACTION: Severe edema     Review of Systems  See history of present illness     Objective:   Physical Exam  Nursing note and vitals reviewed. Constitutional: She appears well-developed and well-nourished. No distress.  HENT:  Head: Normocephalic and atraumatic.  Right Ear: External ear normal.  Left Ear: External ear normal.  Nose: Nose normal.  Mouth/Throat: Oropharynx is clear and moist. No oropharyngeal exudate.  Eyes: Conjunctivae normal are normal. No scleral icterus.  Neck: Neck supple.  Cardiovascular: Normal rate, regular rhythm and normal heart sounds.   Pulmonary/Chest: Effort normal and breath sounds normal. No respiratory distress. She has no wheezes. She has no rales.  Neurological: She is alert.  Skin: Skin is warm and dry. She is not diaphoretic.  Psychiatric: She has a normal mood and affect.          Assessment & Plan:

## 2011-11-10 NOTE — Assessment & Plan Note (Signed)
Suspect possible current viral etiology. Attempt Tessalon Perles when necessary cough. Monitor closely for worsening or failure to improve. Followup if no improvement or worsening.

## 2011-11-11 ENCOUNTER — Ambulatory Visit: Payer: Medicare Other | Admitting: Internal Medicine

## 2011-11-18 DIAGNOSIS — M545 Low back pain, unspecified: Secondary | ICD-10-CM | POA: Diagnosis not present

## 2011-11-18 DIAGNOSIS — G894 Chronic pain syndrome: Secondary | ICD-10-CM | POA: Diagnosis not present

## 2011-11-18 DIAGNOSIS — Z462 Encounter for fitting and adjustment of other devices related to nervous system and special senses: Secondary | ICD-10-CM | POA: Diagnosis not present

## 2011-11-18 DIAGNOSIS — M5137 Other intervertebral disc degeneration, lumbosacral region: Secondary | ICD-10-CM | POA: Diagnosis not present

## 2011-11-20 ENCOUNTER — Ambulatory Visit: Payer: Medicare Other | Admitting: Critical Care Medicine

## 2011-11-24 DIAGNOSIS — R932 Abnormal findings on diagnostic imaging of liver and biliary tract: Secondary | ICD-10-CM | POA: Diagnosis not present

## 2011-11-24 DIAGNOSIS — K2100 Gastro-esophageal reflux disease with esophagitis, without bleeding: Secondary | ICD-10-CM | POA: Diagnosis not present

## 2011-11-24 DIAGNOSIS — K5289 Other specified noninfective gastroenteritis and colitis: Secondary | ICD-10-CM | POA: Diagnosis not present

## 2011-11-24 DIAGNOSIS — R109 Unspecified abdominal pain: Secondary | ICD-10-CM | POA: Diagnosis not present

## 2011-11-28 ENCOUNTER — Ambulatory Visit: Payer: Medicare Other | Admitting: Internal Medicine

## 2011-12-16 DIAGNOSIS — R51 Headache: Secondary | ICD-10-CM | POA: Diagnosis not present

## 2011-12-16 DIAGNOSIS — M545 Low back pain, unspecified: Secondary | ICD-10-CM | POA: Diagnosis not present

## 2011-12-16 DIAGNOSIS — Z462 Encounter for fitting and adjustment of other devices related to nervous system and special senses: Secondary | ICD-10-CM | POA: Diagnosis not present

## 2011-12-16 DIAGNOSIS — M5137 Other intervertebral disc degeneration, lumbosacral region: Secondary | ICD-10-CM | POA: Diagnosis not present

## 2012-01-05 ENCOUNTER — Other Ambulatory Visit: Payer: Self-pay | Admitting: Internal Medicine

## 2012-01-05 MED ORDER — GABAPENTIN 100 MG PO CAPS: 100.0000 mg | ORAL_CAPSULE | Freq: Three times a day (TID) | ORAL | Status: DC

## 2012-01-05 NOTE — Telephone Encounter (Signed)
Refill- gabapentin 100mg  capsule. Take one capsule(100mg  total) by mouth three times daily. Qty 90 last fill 9.4.13

## 2012-01-14 DIAGNOSIS — M545 Low back pain, unspecified: Secondary | ICD-10-CM | POA: Diagnosis not present

## 2012-01-14 DIAGNOSIS — Z79899 Other long term (current) drug therapy: Secondary | ICD-10-CM | POA: Diagnosis not present

## 2012-01-14 DIAGNOSIS — G894 Chronic pain syndrome: Secondary | ICD-10-CM | POA: Diagnosis not present

## 2012-01-14 DIAGNOSIS — R51 Headache: Secondary | ICD-10-CM | POA: Diagnosis not present

## 2012-01-14 DIAGNOSIS — M5137 Other intervertebral disc degeneration, lumbosacral region: Secondary | ICD-10-CM | POA: Diagnosis not present

## 2012-02-10 DIAGNOSIS — Z79899 Other long term (current) drug therapy: Secondary | ICD-10-CM | POA: Diagnosis not present

## 2012-02-10 DIAGNOSIS — G894 Chronic pain syndrome: Secondary | ICD-10-CM | POA: Diagnosis not present

## 2012-02-10 DIAGNOSIS — M545 Low back pain, unspecified: Secondary | ICD-10-CM | POA: Diagnosis not present

## 2012-03-10 ENCOUNTER — Other Ambulatory Visit: Payer: Self-pay | Admitting: Internal Medicine

## 2012-03-12 DIAGNOSIS — G894 Chronic pain syndrome: Secondary | ICD-10-CM | POA: Diagnosis not present

## 2012-03-12 DIAGNOSIS — M545 Low back pain, unspecified: Secondary | ICD-10-CM | POA: Diagnosis not present

## 2012-03-26 DIAGNOSIS — D500 Iron deficiency anemia secondary to blood loss (chronic): Secondary | ICD-10-CM | POA: Diagnosis not present

## 2012-03-26 DIAGNOSIS — R932 Abnormal findings on diagnostic imaging of liver and biliary tract: Secondary | ICD-10-CM | POA: Diagnosis not present

## 2012-03-26 DIAGNOSIS — R109 Unspecified abdominal pain: Secondary | ICD-10-CM | POA: Diagnosis not present

## 2012-03-26 DIAGNOSIS — R197 Diarrhea, unspecified: Secondary | ICD-10-CM | POA: Diagnosis not present

## 2012-04-01 DIAGNOSIS — J449 Chronic obstructive pulmonary disease, unspecified: Secondary | ICD-10-CM | POA: Diagnosis not present

## 2012-04-01 DIAGNOSIS — M545 Low back pain, unspecified: Secondary | ICD-10-CM | POA: Diagnosis not present

## 2012-04-01 DIAGNOSIS — I10 Essential (primary) hypertension: Secondary | ICD-10-CM | POA: Diagnosis not present

## 2012-04-01 DIAGNOSIS — Z885 Allergy status to narcotic agent status: Secondary | ICD-10-CM | POA: Diagnosis not present

## 2012-04-01 DIAGNOSIS — IMO0001 Reserved for inherently not codable concepts without codable children: Secondary | ICD-10-CM | POA: Diagnosis not present

## 2012-04-01 DIAGNOSIS — M069 Rheumatoid arthritis, unspecified: Secondary | ICD-10-CM | POA: Diagnosis not present

## 2012-04-01 DIAGNOSIS — Z7982 Long term (current) use of aspirin: Secondary | ICD-10-CM | POA: Diagnosis not present

## 2012-04-01 DIAGNOSIS — Z79899 Other long term (current) drug therapy: Secondary | ICD-10-CM | POA: Diagnosis not present

## 2012-04-01 DIAGNOSIS — M5137 Other intervertebral disc degeneration, lumbosacral region: Secondary | ICD-10-CM | POA: Diagnosis not present

## 2012-04-01 DIAGNOSIS — M533 Sacrococcygeal disorders, not elsewhere classified: Secondary | ICD-10-CM | POA: Diagnosis not present

## 2012-04-08 DIAGNOSIS — G894 Chronic pain syndrome: Secondary | ICD-10-CM | POA: Diagnosis not present

## 2012-04-08 DIAGNOSIS — M545 Low back pain, unspecified: Secondary | ICD-10-CM | POA: Diagnosis not present

## 2012-04-08 DIAGNOSIS — M5137 Other intervertebral disc degeneration, lumbosacral region: Secondary | ICD-10-CM | POA: Diagnosis not present

## 2012-04-08 DIAGNOSIS — R51 Headache: Secondary | ICD-10-CM | POA: Diagnosis not present

## 2012-05-03 ENCOUNTER — Telehealth: Payer: Self-pay | Admitting: Critical Care Medicine

## 2012-05-03 NOTE — Telephone Encounter (Signed)
Yes it is safe to fly without oxygen on plane

## 2012-05-03 NOTE — Telephone Encounter (Signed)
Spoke with pt She is wondering if PW thinks that the pt is safe to fly  She only uses o2 at HS and PRN during the day Please advise thanks!

## 2012-05-03 NOTE — Telephone Encounter (Signed)
ATC Angela. No answer. LMOMTCB

## 2012-05-04 DIAGNOSIS — M545 Low back pain, unspecified: Secondary | ICD-10-CM | POA: Diagnosis not present

## 2012-05-04 DIAGNOSIS — G894 Chronic pain syndrome: Secondary | ICD-10-CM | POA: Diagnosis not present

## 2012-05-04 DIAGNOSIS — R51 Headache: Secondary | ICD-10-CM | POA: Diagnosis not present

## 2012-05-04 DIAGNOSIS — M5137 Other intervertebral disc degeneration, lumbosacral region: Secondary | ICD-10-CM | POA: Diagnosis not present

## 2012-05-04 NOTE — Telephone Encounter (Signed)
Spoke with Marylene Land and notified of recs per PW  She verbalized understanding and states nothing further needed

## 2012-05-31 DIAGNOSIS — M545 Low back pain, unspecified: Secondary | ICD-10-CM | POA: Diagnosis not present

## 2012-05-31 DIAGNOSIS — M5137 Other intervertebral disc degeneration, lumbosacral region: Secondary | ICD-10-CM | POA: Diagnosis not present

## 2012-05-31 DIAGNOSIS — G894 Chronic pain syndrome: Secondary | ICD-10-CM | POA: Diagnosis not present

## 2012-05-31 DIAGNOSIS — R51 Headache: Secondary | ICD-10-CM | POA: Diagnosis not present

## 2012-05-31 DIAGNOSIS — M5136 Other intervertebral disc degeneration, lumbar region: Secondary | ICD-10-CM | POA: Insufficient documentation

## 2012-06-28 DIAGNOSIS — R51 Headache: Secondary | ICD-10-CM | POA: Diagnosis not present

## 2012-06-28 DIAGNOSIS — M5137 Other intervertebral disc degeneration, lumbosacral region: Secondary | ICD-10-CM | POA: Diagnosis not present

## 2012-06-28 DIAGNOSIS — M545 Low back pain, unspecified: Secondary | ICD-10-CM | POA: Diagnosis not present

## 2012-06-28 DIAGNOSIS — G894 Chronic pain syndrome: Secondary | ICD-10-CM | POA: Diagnosis not present

## 2012-07-14 ENCOUNTER — Telehealth: Payer: Self-pay

## 2012-07-14 NOTE — Telephone Encounter (Signed)
I spoke with pt about receiving paperwork from [facility] about her back pain.   Pt states she has not spoke to anyone about this and she doesn't want it.

## 2012-07-28 DIAGNOSIS — M545 Low back pain, unspecified: Secondary | ICD-10-CM | POA: Diagnosis not present

## 2012-07-28 DIAGNOSIS — M5137 Other intervertebral disc degeneration, lumbosacral region: Secondary | ICD-10-CM | POA: Diagnosis not present

## 2012-07-28 DIAGNOSIS — R51 Headache: Secondary | ICD-10-CM | POA: Diagnosis not present

## 2012-07-28 DIAGNOSIS — G894 Chronic pain syndrome: Secondary | ICD-10-CM | POA: Diagnosis not present

## 2012-08-04 ENCOUNTER — Other Ambulatory Visit: Payer: Self-pay | Admitting: Family

## 2012-08-04 NOTE — Telephone Encounter (Signed)
Rx request to pharmacy; **Office Visit Needed Prior to Future Refills**/SLS  

## 2012-08-11 ENCOUNTER — Ambulatory Visit (INDEPENDENT_AMBULATORY_CARE_PROVIDER_SITE_OTHER): Payer: Medicare Other | Admitting: Family

## 2012-08-11 ENCOUNTER — Encounter: Payer: Self-pay | Admitting: Family

## 2012-08-11 VITALS — BP 100/65 | HR 85 | Temp 98.0°F | Resp 16 | Wt 117.0 lb

## 2012-08-11 DIAGNOSIS — R1013 Epigastric pain: Secondary | ICD-10-CM

## 2012-08-11 LAB — CBC WITH DIFFERENTIAL/PLATELET
Basophils Absolute: 0 10*3/uL (ref 0.0–0.1)
Basophils Relative: 1 % (ref 0–1)
Eosinophils Absolute: 0.1 10*3/uL (ref 0.0–0.7)
Eosinophils Relative: 3 % (ref 0–5)
HCT: 37.8 % (ref 36.0–46.0)
Hemoglobin: 12.3 g/dL (ref 12.0–15.0)
Lymphocytes Relative: 24 % (ref 12–46)
Lymphs Abs: 0.9 10*3/uL (ref 0.7–4.0)
MCH: 30.4 pg (ref 26.0–34.0)
MCHC: 32.5 g/dL (ref 30.0–36.0)
MCV: 93.6 fL (ref 78.0–100.0)
Monocytes Absolute: 0.3 10*3/uL (ref 0.1–1.0)
Monocytes Relative: 8 % (ref 3–12)
Neutro Abs: 2.5 10*3/uL (ref 1.7–7.7)
Neutrophils Relative %: 64 % (ref 43–77)
Platelets: 226 10*3/uL (ref 150–400)
RBC: 4.04 MIL/uL (ref 3.87–5.11)
RDW: 13.5 % (ref 11.5–15.5)
WBC: 3.8 10*3/uL — ABNORMAL LOW (ref 4.0–10.5)

## 2012-08-11 MED ORDER — PROMETHAZINE HCL 25 MG PO TABS: 25.0000 mg | ORAL_TABLET | ORAL | Status: DC | PRN

## 2012-08-11 MED ORDER — OMEPRAZOLE 40 MG PO CPDR: 40.0000 mg | DELAYED_RELEASE_CAPSULE | Freq: Every day | ORAL | Status: DC

## 2012-08-11 NOTE — Progress Notes (Signed)
Subjective:    Patient ID: [name] [name], female    DOB: [birth year], 79 y.o.   MRN: [id number]  HPI  [name] [name] is an 79 yr old female who presents today with chief complaint of abdominal pain. Pain is located in the epigastric area.    Pain has been present for 3 months and is associated with vomiting. Last vomited on Saturday. Reports good PO intake. Normal BM's, denies black or blood stools.    Chart review notes + hx of PUD and pancreatitis.  She is s/p cholecystectomy   Review of Systems See HPI  Past Medical History  Diagnosis Date  . Osteoporosis   . Arthritis     osteoarthritis  . GERD (gastroesophageal reflux disease)   . Hiatal hernia   . COPD (chronic obstructive pulmonary disease)   . Pneumonia   . Hypotension   . PUD (peptic ulcer disease)   . Cardiac arrhythmia   . Hyperlipidemia   . Heart failure   . Hypertension   . Scoliosis deformity of spine     history of intractable back pain  . Choledocholithiasis   . Pancolitis     Infectious vs. inflammatory  . Abdominal pain     Resolved  . Tachycardia   . Chronic anemia   . Fecal impaction   . Chronic back pain   . Pancreatitis     History of  . CHF (congestive heart failure)     History   Social History  . Marital Status: Widowed    Spouse Name: N/A    Number of Children: 3  . Years of Education: N/A   Occupational History  .     Social History Main Topics  . Smoking status: Former Smoker -- 1.00 packs/day for 12 years    Types: Cigarettes    Quit date: 01/28/1988  . Smokeless tobacco: Never Used  . Alcohol Use: No  . Drug Use: No  . Sexually Active: No   Other Topics Concern  . Not on file   Social History Narrative   1 grandchild at care link--Angelia   Lives with great-granddaughter    Past Surgical History  Procedure Laterality Date  . Cholecystectomy  2008  . Abdominal hysterectomy  1970s  . Spine surgery  [phone]  . Neck surgery  1970s  . Spinal cord stimulator  implant    . Ercp w/ sphicterotomy  02/2010  . Back surgery      x 5    Family History  Problem Relation Age of Onset  . Cancer Mother     uterine  . Heart disease Father   . Hypertension Other     Allergies  Allergen Reactions  . Codeine     REACTION: n/v/d, HA  . Morphine     REACTION: n/v/d, HA  . Sulfa Antibiotics   . Zoledronic Acid     REACTION: Severe edema    Current Outpatient Prescriptions on File Prior to Visit  Medication Sig Dispense Refill  . albuterol (VENTOLIN HFA) 108 (90 BASE) MCG/ACT inhaler Inhale 1 puff into the lungs 3 (three) times daily as needed.        . Ascorbic Acid (VITAMIN C) 500 MG tablet Take 500 mg by mouth daily.        . benzonatate (TESSALON) 100 MG capsule Take 1 capsule (100 mg total) by mouth 2 (two) times daily as needed for cough.  20 capsule  0  . budesonide-formoterol (SYMBICORT) 160-4.5 MCG/ACT inhaler Inhale  2 puffs into the lungs 2 (two) times daily.        . butalbital-acetaminophen-caffeine (FIORICET) 50-325-40 MG per tablet Take 1 tablet by mouth every 8 (eight) hours as needed. Migraine      . Calcium Carbonate-Vitamin D (CALCIUM 600-D) 600-400 MG-UNIT per tablet Take 1 tablet by mouth 2 (two) times daily with a meal.        . cyclobenzaprine (FLEXERIL) 10 MG tablet Take 1 tablet by mouth 3 (three) times daily as needed.       . diclofenac sodium (VOLTAREN) 1 % GEL Apply 1 application topically 4 (four) times daily as needed. Inflammation      . ferrous fumarate-iron polysaccharide complex (TANDEM) 162-115.2 MG CAPS Take 1 capsule by mouth daily.        . fluticasone (FLONASE) 50 MCG/ACT nasal spray       . gabapentin (NEURONTIN) 100 MG capsule Take 1 capsule (100 mg total) by mouth 3 (three) times daily.  90 capsule  2  . HYDROmorphone (DILAUDID) 2 MG tablet Take 1 tablet (2 mg total) by mouth as needed. pain  30 tablet  0  . hyoscyamine (ANASPAZ) 0.125 MG TBDP Place 0.125 mg under the tongue daily.      . Lactulose SOLN Take  2 mLs by mouth every evening.      [name] levofloxacin (LEVAQUIN) 500 MG tablet       . metroNIDAZOLE (FLAGYL) 500 MG tablet Take 1 tablet (500 mg total) by mouth every 8 (eight) hours.  6 tablet  0  . nebivolol (BYSTOLIC) 5 MG tablet Take 5 mg by mouth daily. Take 1/2 tablet by mouth daily.      . nitroGLYCERIN (NITROSTAT) 0.4 MG SL tablet Place 1 tablet (0.4 mg total) under the tongue every 5 (five) minutes as needed for chest pain.  100 tablet  3  . NON FORMULARY OXYGEN. 2L at night       . omeprazole (PRILOSEC) 20 MG capsule TAKE 2 CAPSULES BY MOUTH DAILY  60 capsule  0  . promethazine (PHENERGAN) 25 MG tablet Take 25 mg by mouth as needed. Nausea      . tiotropium (SPIRIVA) 18 MCG inhalation capsule Place 18 mcg into inhaler and inhale daily.        . Vitamin D, Ergocalciferol, (DRISDOL) 50000 UNITS CAPS Take 50,000 Units by mouth every 7 (seven) days.       No current facility-administered medications on file prior to visit.    BP 100/65  Pulse 85  Temp(Src) 98 F (36.7 C) (Oral)  Resp 16  Wt 117 lb 0.6 oz (53.089 kg)  BMI 22.86 kg/m2  SpO2 94%       Objective:   Physical Exam  Constitutional: She is oriented to person, place, and time. She appears well-developed and well-nourished.  HENT:  Head: Normocephalic and atraumatic.  Cardiovascular: Normal rate and regular rhythm.   No murmur heard. Pulmonary/Chest: Effort normal and breath sounds normal. No respiratory distress. She has no wheezes. She has no rales. She exhibits no tenderness.  Abdominal: Soft. Bowel sounds are normal. She exhibits no distension.  Mild epigastric tenderness to palpation without guarding  Musculoskeletal: She exhibits no edema.  Neurological: She is alert and oriented to person, place, and time.  Skin: Skin is warm and dry.  Psychiatric: She has a normal mood and affect. Her behavior is normal. Judgment and thought content normal.          Assessment & Plan:

## 2012-08-11 NOTE — Assessment & Plan Note (Signed)
Increase omeprazole from 20mg  to 40mg  daily.  Obtain LFT/Lipase to evaluate for pancreatitis.  Refill prn phenergan. Pt to keep upcoming GI apt. Pt instructed to go to the ED if severe/worsening abdominal pain or if unable to keep down food/liquids.

## 2012-08-11 NOTE — Patient Instructions (Addendum)
Keep upcoming appointment with Dr. Kathy Breach. Complete your lab work prior to leaving.  Increase omeprazole from 20mg  to 40mg  (I have sent 40mg  tabs to your pharmacy). Please schedule a follow up appointment in 1 month.

## 2012-08-12 ENCOUNTER — Encounter: Payer: Self-pay | Admitting: Family

## 2012-08-12 LAB — HEPATIC FUNCTION PANEL
ALT: 11 U/L (ref 0–35)
AST: 15 U/L (ref 0–37)
Albumin: 3.8 g/dL (ref 3.5–5.2)
Alkaline Phosphatase: 75 U/L (ref 39–117)
Bilirubin, Direct: <0.1 mg/dL (ref 0.0–0.3)
Total Bilirubin: 0.2 mg/dL — ABNORMAL LOW (ref 0.3–1.2)
Total Protein: 5.7 g/dL — ABNORMAL LOW (ref 6.0–8.3)

## 2012-08-12 LAB — LIPASE: Lipase: 14 U/L (ref 0–75)

## 2012-08-13 ENCOUNTER — Telehealth: Payer: Self-pay | Admitting: *Deleted

## 2012-08-13 MED ORDER — OMEPRAZOLE 40 MG PO CPDR: 40.0000 mg | DELAYED_RELEASE_CAPSULE | Freq: Two times a day (BID) | ORAL | Status: DC

## 2012-08-13 NOTE — Telephone Encounter (Signed)
OK to send 1 month supply of omeprazole 40mg  BID.  I defer long term use of this dose to Dr. Kathy Breach. She can discuss with him at her upcoming apt.

## 2012-08-13 NOTE — Telephone Encounter (Signed)
Rx sent, notified pt and she voices understanding.

## 2012-08-13 NOTE — Telephone Encounter (Signed)
Received message from pt stating she was previously taking omeprazole 20mg  twice a day. Was told at recent visit that we were increasing medication to 40mg  twice a day but rx at pharmacy said 40mg  once a day.  Please advise.

## 2012-08-17 ENCOUNTER — Telehealth: Payer: Self-pay | Admitting: *Deleted

## 2012-08-17 NOTE — Telephone Encounter (Signed)
Received call from a female stating she was calling from [facility]. States she is patient's case worker and has been unable to get in touch with pt and wanted to verify that pt was not currently in the hospital. Caller ID did not identify caller's phone number. I asked caller if she could fax something on their letterhead with her request. She states that it is not necessary for her to do that since they are contracted with [place]. I told her I did not feel comfortable providing any information unless we received request on company letterhead. She stated that she was not going to do that and was ending the call.

## 2012-08-26 DIAGNOSIS — M5137 Other intervertebral disc degeneration, lumbosacral region: Secondary | ICD-10-CM | POA: Diagnosis not present

## 2012-08-26 DIAGNOSIS — M545 Low back pain, unspecified: Secondary | ICD-10-CM | POA: Diagnosis not present

## 2012-08-26 DIAGNOSIS — Z79899 Other long term (current) drug therapy: Secondary | ICD-10-CM | POA: Diagnosis not present

## 2012-08-26 DIAGNOSIS — G894 Chronic pain syndrome: Secondary | ICD-10-CM | POA: Diagnosis not present

## 2012-09-02 DIAGNOSIS — R932 Abnormal findings on diagnostic imaging of liver and biliary tract: Secondary | ICD-10-CM | POA: Diagnosis not present

## 2012-09-02 DIAGNOSIS — R109 Unspecified abdominal pain: Secondary | ICD-10-CM | POA: Diagnosis not present

## 2012-09-02 DIAGNOSIS — R112 Nausea with vomiting, unspecified: Secondary | ICD-10-CM | POA: Diagnosis not present

## 2012-09-15 ENCOUNTER — Ambulatory Visit (INDEPENDENT_AMBULATORY_CARE_PROVIDER_SITE_OTHER): Payer: Medicare Other | Admitting: Family

## 2012-09-15 ENCOUNTER — Encounter: Payer: Self-pay | Admitting: Family

## 2012-09-15 VITALS — BP 102/66 | HR 81 | Temp 98.6°F | Resp 16 | Wt 119.0 lb

## 2012-09-15 DIAGNOSIS — G44229 Chronic tension-type headache, not intractable: Secondary | ICD-10-CM

## 2012-09-15 DIAGNOSIS — R1013 Epigastric pain: Secondary | ICD-10-CM | POA: Diagnosis not present

## 2012-09-15 DIAGNOSIS — M1990 Unspecified osteoarthritis, unspecified site: Secondary | ICD-10-CM | POA: Diagnosis not present

## 2012-09-15 MED ORDER — DICLOFENAC SODIUM 1 % TD GEL: 1.0000 "application " | Freq: Four times a day (QID) | TRANSDERMAL | Status: DC | PRN

## 2012-09-15 NOTE — Assessment & Plan Note (Signed)
Per pt description, the headaches seem to originate in the neck.  I advised pt on use of flexeril prn neck pain to see if this helps. She is already on dilaudid.  I also advised her to discontinue fioricet and goody powder as I believe that she may be getting some rebound headaches from the chronic use. Advised tylenol prn.  We discussed referral to a headache specialist but she declines at this time.

## 2012-09-15 NOTE — Assessment & Plan Note (Signed)
Suspect gastritis related to NSAID abuse. Advised discontinuation of all NSAIDS.  Continue PPI.

## 2012-09-15 NOTE — Progress Notes (Signed)
Subjective:    Patient ID: [name] [name], female    DOB: 23-Jun-1925, 80 y.o.   MRN: [id number]  HPI  Epigastric Pain- she reports that she saw Dr. Kathy Breach and he had no further recommendations.  Reports that the pain comes and goes.   Headaches- reports that sometimes she wakes up with headaches.  Reports that that pain starts at the back of the neck and then works it's way up.   When she gets these headaches she takes a "headache pill" (fioricet).  If HA persists then she may need to take a goody powder.  Reports that she does this nearly every day. She reports that she sometimes has associated nausea and vomiting, though notes that she only gets these symptoms 1-2 times a month.  Denies associated blurred vision, photophobia or phonophobia.    Arthritis- reports + pain in hands and neck.   Review of Systems See HPI  Past Medical History  Diagnosis Date  . Osteoporosis   . Arthritis     osteoarthritis  . GERD (gastroesophageal reflux disease)   . Hiatal hernia   . COPD (chronic obstructive pulmonary disease)   . Pneumonia   . Hypotension   . PUD (peptic ulcer disease)   . Cardiac arrhythmia   . Hyperlipidemia   . Heart failure   . Hypertension   . Scoliosis deformity of spine     history of intractable back pain  . Choledocholithiasis   . Pancolitis     Infectious vs. inflammatory  . Abdominal pain     Resolved  . Tachycardia   . Chronic anemia   . Fecal impaction   . Chronic back pain   . Pancreatitis     History of  . CHF (congestive heart failure)     History   Social History  . Marital Status: Widowed    Spouse Name: N/A    Number of Children: 3  . Years of Education: N/A   Occupational History  .     Social History Main Topics  . Smoking status: Former Smoker -- 1.00 packs/day for 12 years    Types: Cigarettes    Quit date: 01/28/1988  . Smokeless tobacco: Never Used  . Alcohol Use: No  . Drug Use: No  . Sexual Activity: No   Other Topics Concern   . Not on file   Social History Narrative   1 grandchild at care link--Angelia   Lives with great-granddaughter    Past Surgical History  Procedure Laterality Date  . Cholecystectomy  2008  . Abdominal hysterectomy  1970s  . Spine surgery  [phone]  . Neck surgery  1970s  . Spinal cord stimulator implant    . Ercp w/ sphicterotomy  02/2010  . Back surgery      x 5    Family History  Problem Relation Age of Onset  . Cancer Mother     uterine  . Heart disease Father   . Hypertension Other     Allergies  Allergen Reactions  . Codeine     REACTION: n/v/d, HA  . Morphine     REACTION: n/v/d, HA  . Sulfa Antibiotics   . Zoledronic Acid     REACTION: Severe edema    Current Outpatient Prescriptions on File Prior to Visit  Medication Sig Dispense Refill  . albuterol (VENTOLIN HFA) 108 (90 BASE) MCG/ACT inhaler Inhale 1 puff into the lungs 3 (three) times daily as needed.        [name]  Ascorbic Acid (VITAMIN C) 500 MG tablet Take 500 mg by mouth daily.        . budesonide-formoterol (SYMBICORT) 160-4.5 MCG/ACT inhaler Inhale 2 puffs into the lungs 2 (two) times daily.        . butalbital-acetaminophen-caffeine (FIORICET) 50-325-40 MG per tablet Take 1 tablet by mouth every 8 (eight) hours as needed. Migraine      . Calcium Carbonate-Vitamin D (CALCIUM 600-D) 600-400 MG-UNIT per tablet Take 1 tablet by mouth 2 (two) times daily with a meal.        . cyclobenzaprine (FLEXERIL) 10 MG tablet Take 1 tablet by mouth 3 (three) times daily as needed.       . ferrous fumarate-iron polysaccharide complex (TANDEM) 162-115.2 MG CAPS Take 1 capsule by mouth daily.        . fluticasone (FLONASE) 50 MCG/ACT nasal spray       . gabapentin (NEURONTIN) 100 MG capsule Take 1 capsule (100 mg total) by mouth 3 (three) times daily.  90 capsule  2  . HYDROmorphone (DILAUDID) 2 MG tablet Take 1 tablet (2 mg total) by mouth as needed. pain  30 tablet  0  . hyoscyamine (ANASPAZ) 0.125 MG TBDP Place  0.125 mg under the tongue daily.      . Lactulose SOLN Take 2 mLs by mouth at bedtime as needed.       . metroNIDAZOLE (FLAGYL) 500 MG tablet Take 1 tablet (500 mg total) by mouth every 8 (eight) hours.  6 tablet  0  . nebivolol (BYSTOLIC) 5 MG tablet Take 5 mg by mouth daily. Take 1/2 tablet by mouth daily.      . nitroGLYCERIN (NITROSTAT) 0.4 MG SL tablet Place 1 tablet (0.4 mg total) under the tongue every 5 (five) minutes as needed for chest pain.  100 tablet  3  . NON FORMULARY OXYGEN. 2L at night       . omeprazole (PRILOSEC) 40 MG capsule Take 1 capsule (40 mg total) by mouth 2 (two) times daily.  60 capsule  0  . promethazine (PHENERGAN) 25 MG tablet Take 1 tablet (25 mg total) by mouth as needed. Nausea  30 tablet  0  . tiotropium (SPIRIVA) 18 MCG inhalation capsule Place 18 mcg into inhaler and inhale daily.        . Vitamin D, Ergocalciferol, (DRISDOL) 50000 UNITS CAPS Take 50,000 Units by mouth every 7 (seven) days.       No current facility-administered medications on file prior to visit.    BP 102/66  Pulse 81  Temp(Src) 98.6 F (37 C) (Oral)  Resp 16  Wt 119 lb 0.6 oz (53.996 kg)  BMI 23.25 kg/m2  SpO2 96%       Objective:   Physical Exam  Constitutional: She appears well-developed and well-nourished. No distress.  Eyes: Pupils are equal, round, and reactive to light.  Cardiovascular: Normal rate and regular rhythm.   No murmur heard. Pulmonary/Chest: Effort normal and breath sounds normal. No respiratory distress. She has no wheezes. She has no rales. She exhibits no tenderness.  Musculoskeletal: She exhibits no edema.  Prominent knuckles noted on bilateral hands          Assessment & Plan:

## 2012-09-15 NOTE — Patient Instructions (Addendum)
Please stop all Goodie powder and the fioricet. You may use tylenol as needed for pain/headache. You may use the flexeril prn.  Call if your headaches worsen, or if not improved.

## 2012-09-15 NOTE — Assessment & Plan Note (Signed)
Recommended use of voltaren gel prn.

## 2012-09-20 ENCOUNTER — Encounter: Payer: Self-pay | Admitting: Family

## 2012-09-20 ENCOUNTER — Ambulatory Visit (INDEPENDENT_AMBULATORY_CARE_PROVIDER_SITE_OTHER): Payer: Medicare Other | Admitting: Family

## 2012-09-20 VITALS — BP 112/60 | HR 90 | Temp 98.2°F | Resp 16 | Wt 119.1 lb

## 2012-09-20 DIAGNOSIS — R51 Headache: Secondary | ICD-10-CM | POA: Diagnosis not present

## 2012-09-20 DIAGNOSIS — R4182 Altered mental status, unspecified: Secondary | ICD-10-CM | POA: Diagnosis not present

## 2012-09-20 DIAGNOSIS — G894 Chronic pain syndrome: Secondary | ICD-10-CM | POA: Diagnosis not present

## 2012-09-20 DIAGNOSIS — M5137 Other intervertebral disc degeneration, lumbosacral region: Secondary | ICD-10-CM | POA: Diagnosis not present

## 2012-09-20 DIAGNOSIS — M545 Low back pain, unspecified: Secondary | ICD-10-CM | POA: Diagnosis not present

## 2012-09-20 DIAGNOSIS — N390 Urinary tract infection, site not specified: Secondary | ICD-10-CM | POA: Diagnosis not present

## 2012-09-20 LAB — POCT URINALYSIS DIPSTICK
Bilirubin, UA: NEGATIVE
Blood, UA: NEGATIVE
Glucose, UA: NEGATIVE
Ketones, UA: NEGATIVE
Nitrite, UA: NEGATIVE
Protein, UA: NEGATIVE
Spec Grav, UA: 1.015
Urobilinogen, UA: 0.2
pH, UA: 6

## 2012-09-20 MED ORDER — CIPROFLOXACIN HCL 250 MG PO TABS: 250.0000 mg | ORAL_TABLET | Freq: Two times a day (BID) | ORAL | Status: DC

## 2012-09-20 NOTE — Progress Notes (Signed)
Subjective:    Patient ID: [name] [name], female    DOB: Jan 10, 1926, 79 y.o.   MRN: [id number]  HPI  [name] is an 79 yr old female who presents today with her daughter due recent confusion. Daughter notes that for the last few weeks the pt has been having issues with short term memory. She has also complained of fatigue.  Daughter found the pt to be diaphoretic on Saturday despite being in the air conditioning.  The patient denies cough, ear pain, nausea/vomitting.     Review of Systems See HPI  Past Medical History  Diagnosis Date  . Osteoporosis   . Arthritis     osteoarthritis  . GERD (gastroesophageal reflux disease)   . Hiatal hernia   . COPD (chronic obstructive pulmonary disease)   . Pneumonia   . Hypotension   . PUD (peptic ulcer disease)   . Cardiac arrhythmia   . Hyperlipidemia   . Heart failure   . Hypertension   . Scoliosis deformity of spine     history of intractable back pain  . Choledocholithiasis   . Pancolitis     Infectious vs. inflammatory  . Abdominal pain     Resolved  . Tachycardia   . Chronic anemia   . Fecal impaction   . Chronic back pain   . Pancreatitis     History of  . CHF (congestive heart failure)     History   Social History  . Marital Status: Widowed    Spouse Name: N/A    Number of Children: 3  . Years of Education: N/A   Occupational History  .     Social History Main Topics  . Smoking status: Former Smoker -- 1.00 packs/day for 12 years    Types: Cigarettes    Quit date: 01/28/1988  . Smokeless tobacco: Never Used  . Alcohol Use: No  . Drug Use: No  . Sexual Activity: No   Other Topics Concern  . Not on file   Social History Narrative   1 grandchild at care link--Angelia   Lives with great-granddaughter    Past Surgical History  Procedure Laterality Date  . Cholecystectomy  2008  . Abdominal hysterectomy  1970s  . Spine surgery  [phone]  . Neck surgery  1970s  . Spinal cord stimulator  implant    . Ercp w/ sphicterotomy  02/2010  . Back surgery      x 5    Family History  Problem Relation Age of Onset  . Cancer Mother     uterine  . Heart disease Father   . Hypertension Other     Allergies  Allergen Reactions  . Codeine     REACTION: n/v/d, HA  . Morphine     REACTION: n/v/d, HA  . Sulfa Antibiotics   . Zoledronic Acid     REACTION: Severe edema    Current Outpatient Prescriptions on File Prior to Visit  Medication Sig Dispense Refill  . albuterol (VENTOLIN HFA) 108 (90 BASE) MCG/ACT inhaler Inhale 1 puff into the lungs 3 (three) times daily as needed.        . Ascorbic Acid (VITAMIN C) 500 MG tablet Take 500 mg by mouth daily.        . budesonide-formoterol (SYMBICORT) 160-4.5 MCG/ACT inhaler Inhale 2 puffs into the lungs 2 (two) times daily.        . Calcium Carbonate-Vitamin D (CALCIUM 600-D) 600-400 MG-UNIT per tablet Take 1 tablet by mouth 2 (  two) times daily with a meal.        . cyclobenzaprine (FLEXERIL) 10 MG tablet Take 1 tablet by mouth 3 (three) times daily as needed.       . diclofenac sodium (VOLTAREN) 1 % GEL Apply 1 application topically 4 (four) times daily as needed. Inflammation  100 g  2  . ferrous fumarate-iron polysaccharide complex (TANDEM) 162-115.2 MG CAPS Take 1 capsule by mouth daily.        . fluticasone (FLONASE) 50 MCG/ACT nasal spray       . gabapentin (NEURONTIN) 100 MG capsule Take 1 capsule (100 mg total) by mouth 3 (three) times daily.  90 capsule  2  . HYDROmorphone (DILAUDID) 2 MG tablet Take 1 tablet (2 mg total) by mouth as needed. pain  30 tablet  0  . hyoscyamine (ANASPAZ) 0.125 MG TBDP Place 0.125 mg under the tongue daily.      . Lactulose SOLN Take 2 mLs by mouth at bedtime as needed.       . metroNIDAZOLE (FLAGYL) 500 MG tablet Take 1 tablet (500 mg total) by mouth every 8 (eight) hours.  6 tablet  0  . nebivolol (BYSTOLIC) 5 MG tablet Take 5 mg by mouth daily. Take 1/2 tablet by mouth daily.      . nitroGLYCERIN  (NITROSTAT) 0.4 MG SL tablet Place 1 tablet (0.4 mg total) under the tongue every 5 (five) minutes as needed for chest pain.  100 tablet  3  . NON FORMULARY OXYGEN. 2L at night       . omeprazole (PRILOSEC) 40 MG capsule Take 1 capsule (40 mg total) by mouth 2 (two) times daily.  60 capsule  0  . promethazine (PHENERGAN) 25 MG tablet Take 1 tablet (25 mg total) by mouth as needed. Nausea  30 tablet  0  . tiotropium (SPIRIVA) 18 MCG inhalation capsule Place 18 mcg into inhaler and inhale daily.        . Vitamin D, Ergocalciferol, (DRISDOL) 50000 UNITS CAPS Take 50,000 Units by mouth every 7 (seven) days.       No current facility-administered medications on file prior to visit.    BP 112/60  Pulse 90  Temp(Src) 98.2 F (36.8 C) (Oral)  Resp 16  Wt 119 lb 1.9 oz (54.032 kg)  BMI 23.26 kg/m2  SpO2 92%       Objective:   Physical Exam  Constitutional: She appears well-developed and well-nourished. No distress.  HENT:  Head: Normocephalic.  Cardiovascular: Normal rate and regular rhythm.   No murmur heard. Pulmonary/Chest: Effort normal and breath sounds normal. No respiratory distress. She has no wheezes. She has no rales. She exhibits no tenderness.  Abdominal: Soft. She exhibits no distension. There is no tenderness.  Neurological: She is alert.  Skin: Skin is warm and dry.  Psychiatric: She has a normal mood and affect.          Assessment & Plan:

## 2012-09-20 NOTE — Assessment & Plan Note (Addendum)
Will plan rx with cipro for probable UTI.  This is likely cause for recent confusion.  Rx with cipro, send urine for culture. Advised daughter and pt to call as outlined in AVS, follow up in 1 week.

## 2012-09-20 NOTE — Patient Instructions (Addendum)
Please follow up in 1 week. Call if increased weakness, nausea, vomiting, fever, worsening confusion.

## 2012-09-21 LAB — URINE CULTURE
Colony Count: NO GROWTH
Organism ID, Bacteria: NO GROWTH

## 2012-09-28 ENCOUNTER — Ambulatory Visit (INDEPENDENT_AMBULATORY_CARE_PROVIDER_SITE_OTHER): Payer: Medicare Other | Admitting: Family

## 2012-09-28 ENCOUNTER — Encounter: Payer: Self-pay | Admitting: Family

## 2012-09-28 VITALS — BP 120/62 | HR 62 | Temp 98.0°F | Wt 118.0 lb

## 2012-09-28 DIAGNOSIS — R1013 Epigastric pain: Secondary | ICD-10-CM

## 2012-09-28 DIAGNOSIS — G8929 Other chronic pain: Secondary | ICD-10-CM | POA: Diagnosis not present

## 2012-09-28 DIAGNOSIS — R413 Other amnesia: Secondary | ICD-10-CM | POA: Insufficient documentation

## 2012-09-28 NOTE — Patient Instructions (Addendum)
Please follow up in 3 months- call sooner if new or worsening symptoms.

## 2012-09-28 NOTE — Assessment & Plan Note (Signed)
Stable and unchanged.  This is managed by Dr. Kathy Breach.

## 2012-09-28 NOTE — Progress Notes (Signed)
Subjective:    Patient ID: [name] [name], female    DOB: 05-18-1925, 79 y.o.   MRN: [id number]  HPI  [name] is an 79 yr old female who presents today for follow up. She continues to have fatigue.  Urine culture last visit was negative.    She continues to follow with Dr. Kathy Breach.  Intermittent chronic ache which is chronic.  Had ache last night and this morning and then again this am. Tolerating PO's. No fevers.    She continues to have headaches.    She reports that she is sleeping well.  She has not had any further episodes of diaphoresis. No vomiting.  Reports that her mood is ok.   Review of Systems See HPI  Past Medical History  Diagnosis Date  . Osteoporosis   . Arthritis     osteoarthritis  . GERD (gastroesophageal reflux disease)   . Hiatal hernia   . COPD (chronic obstructive pulmonary disease)   . Pneumonia   . Hypotension   . PUD (peptic ulcer disease)   . Cardiac arrhythmia   . Hyperlipidemia   . Heart failure   . Hypertension   . Scoliosis deformity of spine     history of intractable back pain  . Choledocholithiasis   . Pancolitis     Infectious vs. inflammatory  . Abdominal pain     Resolved  . Tachycardia   . Chronic anemia   . Fecal impaction   . Chronic back pain   . Pancreatitis     History of  . CHF (congestive heart failure)     History   Social History  . Marital Status: Widowed    Spouse Name: N/A    Number of Children: 3  . Years of Education: N/A   Occupational History  .     Social History Main Topics  . Smoking status: Former Smoker -- 1.00 packs/day for 12 years    Types: Cigarettes    Quit date: 01/28/1988  . Smokeless tobacco: Never Used  . Alcohol Use: No  . Drug Use: No  . Sexual Activity: No   Other Topics Concern  . Not on file   Social History Narrative   1 grandchild at care link--Angelia   Lives with great-granddaughter    Past Surgical History  Procedure Laterality Date  . Cholecystectomy  2008  .  Abdominal hysterectomy  1970s  . Spine surgery  [phone]  . Neck surgery  1970s  . Spinal cord stimulator implant    . Ercp w/ sphicterotomy  02/2010  . Back surgery      x 5    Family History  Problem Relation Age of Onset  . Cancer Mother     uterine  . Heart disease Father   . Hypertension Other     Allergies  Allergen Reactions  . Codeine     REACTION: n/v/d, HA  . Morphine     REACTION: n/v/d, HA  . Sulfa Antibiotics   . Zoledronic Acid     REACTION: Severe edema    Current Outpatient Prescriptions on File Prior to Visit  Medication Sig Dispense Refill  . albuterol (VENTOLIN HFA) 108 (90 BASE) MCG/ACT inhaler Inhale 1 puff into the lungs 3 (three) times daily as needed.        . Ascorbic Acid (VITAMIN C) 500 MG tablet Take 500 mg by mouth daily.        . budesonide-formoterol (SYMBICORT) 160-4.5 MCG/ACT inhaler Inhale 2 puffs  into the lungs 2 (two) times daily.        . Calcium Carbonate-Vitamin D (CALCIUM 600-D) 600-400 MG-UNIT per tablet Take 1 tablet by mouth 2 (two) times daily with a meal.        . cyclobenzaprine (FLEXERIL) 10 MG tablet Take 1 tablet by mouth 3 (three) times daily as needed.       . Diclofenac Potassium 50 MG PACK Take 1-2 each by mouth daily.      . diclofenac sodium (VOLTAREN) 1 % GEL Apply 1 application topically 4 (four) times daily as needed. Inflammation  100 g  2  . ferrous fumarate-iron polysaccharide complex (TANDEM) 162-115.2 MG CAPS Take 1 capsule by mouth daily.        . fluticasone (FLONASE) 50 MCG/ACT nasal spray       . gabapentin (NEURONTIN) 100 MG capsule Take 1 capsule (100 mg total) by mouth 3 (three) times daily.  90 capsule  2  . HYDROmorphone (DILAUDID) 2 MG tablet Take 1 tablet (2 mg total) by mouth as needed. pain  30 tablet  0  . hyoscyamine (ANASPAZ) 0.125 MG TBDP Place 0.125 mg under the tongue daily.      . Lactulose SOLN Take 2 mLs by mouth at bedtime as needed.       . nebivolol (BYSTOLIC) 5 MG tablet Take 5  mg by mouth daily. Take 1/2 tablet by mouth daily.      . nitroGLYCERIN (NITROSTAT) 0.4 MG SL tablet Place 1 tablet (0.4 mg total) under the tongue every 5 (five) minutes as needed for chest pain.  100 tablet  3  . NON FORMULARY OXYGEN. 2L at night       . omeprazole (PRILOSEC) 40 MG capsule Take 1 capsule (40 mg total) by mouth 2 (two) times daily.  60 capsule  0  . promethazine (PHENERGAN) 25 MG tablet Take 1 tablet (25 mg total) by mouth as needed. Nausea  30 tablet  0  . tiotropium (SPIRIVA) 18 MCG inhalation capsule Place 18 mcg into inhaler and inhale daily.        . Vitamin D, Ergocalciferol, (DRISDOL) 50000 UNITS CAPS Take 50,000 Units by mouth every 7 (seven) days.      . ciprofloxacin (CIPRO) 250 MG tablet Take 1 tablet (250 mg total) by mouth 2 (two) times daily.  6 tablet  0  . metroNIDAZOLE (FLAGYL) 500 MG tablet Take 1 tablet (500 mg total) by mouth every 8 (eight) hours.  6 tablet  0   No current facility-administered medications on file prior to visit.    BP 120/62  Pulse 62  Temp(Src) 98 F (36.7 C) (Oral)  Wt 118 lb 0.6 oz (53.543 kg)  BMI 23.05 kg/m2  SpO2 93%       Objective:   Physical Exam  Constitutional: She is oriented to person, place, and time. She appears well-developed and well-nourished. No distress.  Cardiovascular: Normal rate and regular rhythm.   No murmur heard. Pulmonary/Chest: Effort normal and breath sounds normal. No respiratory distress. She has no wheezes. She has no rales. She exhibits no tenderness.  Abdominal: Soft. Bowel sounds are normal. She exhibits no distension and no mass. There is no tenderness. There is no rebound and no guarding.  Musculoskeletal: She exhibits no edema.  Neurological: She is alert and oriented to person, place, and time.  Psychiatric: She has a normal mood and affect. Her behavior is normal. Judgment and thought content normal.  Assessment & Plan:

## 2012-09-28 NOTE — Assessment & Plan Note (Signed)
We discussed possibility of referral to Neuro and possibility of adding medication for dementia/memory loss.  Daughter wishes to monitor for now and declines referral.

## 2012-09-29 ENCOUNTER — Ambulatory Visit: Payer: Medicare Other | Admitting: Physician Assistant

## 2012-10-08 ENCOUNTER — Encounter: Payer: Self-pay | Admitting: Cardiology

## 2012-10-08 ENCOUNTER — Ambulatory Visit (INDEPENDENT_AMBULATORY_CARE_PROVIDER_SITE_OTHER): Payer: Medicare Other | Admitting: Cardiology

## 2012-10-08 VITALS — BP 126/74 | HR 89 | Ht 60.0 in | Wt 119.0 lb

## 2012-10-08 DIAGNOSIS — I119 Hypertensive heart disease without heart failure: Secondary | ICD-10-CM | POA: Diagnosis not present

## 2012-10-08 NOTE — Progress Notes (Signed)
Rondel Jumbo Date of Birth:  Apr 25, 1925 [facility] [address]   [address] [place], [place]  [address] [phone]           Fax   [phone]  History of Present Illness: This pleasant 79 year old woman seen for a scheduled 1 year followup office visit.  Patient has a history of atypical chest pain.  She had a nuclear stress test on 04/05/08, which showed normal left ventricular systolic function with an ejection fraction of 70% and no evidence of reversible ischemia.  There were no wall motion abnormalities.  The patient does have a history of severe COPD and is followed by pulmonary.  Patient also has a history of hypercholesterolemia.  She's had a past history of orthostatic hypotension, but now has a history of mild essential hypertension.  Blood pressure has been well controlled on nebivolol.  Patient has a history of chronic pain syndrome and is followed in a pain clinic.  Her pain is based upon severe scoliosis and curvature of the spine.  Current Outpatient Prescriptions  Medication Sig Dispense Refill  . albuterol (VENTOLIN HFA) 108 (90 BASE) MCG/ACT inhaler Inhale 1 puff into the lungs 3 (three) times daily as needed.        . Ascorbic Acid (VITAMIN C) 500 MG tablet Take 500 mg by mouth daily.        . budesonide-formoterol (SYMBICORT) 160-4.5 MCG/ACT inhaler Inhale 2 puffs into the lungs 2 (two) times daily.        . Calcium Carbonate-Vitamin D (CALCIUM 600-D) 600-400 MG-UNIT per tablet Take 1 tablet by mouth 2 (two) times daily with a meal.        . cyclobenzaprine (FLEXERIL) 10 MG tablet Take 1 tablet by mouth 3 (three) times daily as needed.       . Diclofenac Potassium 50 MG PACK Take 1-2 each by mouth daily.      . diclofenac sodium (VOLTAREN) 1 % GEL Apply 1 application topically 4 (four) times daily as needed. Inflammation  100 g  2  . ferrous fumarate-iron polysaccharide complex (TANDEM) 162-115.2 MG CAPS Take 1 capsule by mouth daily.         . fluticasone (FLONASE) 50 MCG/ACT nasal spray       . gabapentin (NEURONTIN) 100 MG capsule Take 1 capsule (100 mg total) by mouth 3 (three) times daily.  90 capsule  2  . HYDROmorphone (DILAUDID) 2 MG tablet Take 1 tablet (2 mg total) by mouth as needed. pain  30 tablet  0  . hyoscyamine (ANASPAZ) 0.125 MG TBDP Place 0.125 mg under the tongue daily.      . Lactulose SOLN Take 2 mLs by mouth at bedtime as needed.       . metroNIDAZOLE (FLAGYL) 500 MG tablet Take 1 tablet (500 mg total) by mouth every 8 (eight) hours.  6 tablet  0  . nebivolol (BYSTOLIC) 5 MG tablet Take 5 mg by mouth daily. Take 1/2 tablet by mouth daily.      . nitroGLYCERIN (NITROSTAT) 0.4 MG SL tablet Place 1 tablet (0.4 mg total) under the tongue every 5 (five) minutes as needed for chest pain.  100 tablet  3  . NON FORMULARY OXYGEN. 2L at night       . omeprazole (PRILOSEC) 40 MG capsule Take 1 capsule (40 mg total) by mouth 2 (two) times daily.  60 capsule  0  . promethazine (PHENERGAN) 25 MG tablet Take 1 tablet (25  mg total) by mouth as needed. Nausea  30 tablet  0  . tiotropium (SPIRIVA) 18 MCG inhalation capsule Place 18 mcg into inhaler and inhale daily.        . Vitamin D, Ergocalciferol, (DRISDOL) 50000 UNITS CAPS Take 50,000 Units by mouth every 7 (seven) days.       No current facility-administered medications for this visit.    Allergies  Allergen Reactions  . Codeine     REACTION: n/v/d, HA  . Morphine     REACTION: n/v/d, HA  . Sulfa Antibiotics   . Zoledronic Acid     REACTION: Severe edema    Patient Active Problem List   Diagnosis Date Noted  . Memory change 09/28/2012  . Abdominal pain, chronic, epigastric 09/28/2012  . Chronic tension headaches 09/15/2012  . Abdominal pain, epigastric 08/11/2012  . URI (upper respiratory infection) 11/10/2011  . Cardiac enzymes elevated 10/13/2011  . Vitamin D deficiency 08/17/2010  . Chronic back pain 06/02/2010  . PANCREATIC INSUFFICIENCY 03/19/2010   . ANEMIA 12/27/2009  . PANCREATITIS 12/27/2009  . HEARING LOSS, LEFT EAR 08/17/2009  . NONSPECIFIC ABNORMAL TOXICOLOGICAL FINDINGS 05/10/2009  . COR PULMONALE 04/12/2009  . OSTEOPOROSIS 02/12/2009  . HYPERCHOLESTEROLEMIA 02/01/2009  . HYPERTENSION 02/01/2009  . CARDIAC ARRHYTHMIA 02/01/2009  . HEART FAILURE 02/01/2009  . COPD 02/01/2009  . GERD 02/01/2009  . PEPTIC ULCER DISEASE 02/01/2009  . HIATAL HERNIA 02/01/2009  . OSTEOARTHRITIS 02/01/2009    History  Smoking status  . Former Smoker -- 1.00 packs/day for 12 years  . Types: Cigarettes  . Quit date: 01/28/1988  Smokeless tobacco  . Never Used    History  Alcohol Use No    Family History  Problem Relation Age of Onset  . Cancer Mother     uterine  . Heart disease Father   . Hypertension Other     Review of Systems: Constitutional: no fever chills diaphoresis or fatigue or change in weight.  Head and neck: no hearing loss, no epistaxis, no photophobia or visual disturbance. Respiratory: No cough, shortness of breath or wheezing. Cardiovascular: No chest pain peripheral edema, palpitations. Gastrointestinal: No abdominal distention, no abdominal pain, no change in bowel habits hematochezia or melena. Genitourinary: No dysuria, no frequency, no urgency, no nocturia. Musculoskeletal:No arthralgias, no back pain, no gait disturbance or myalgias. Neurological: No dizziness, no headaches, no numbness, no seizures, no syncope, no weakness, no tremors. Hematologic: No lymphadenopathy, no easy bruising. Psychiatric: No confusion, no hallucinations, no sleep disturbance.    Physical Exam: Filed Vitals:   10/08/12 1552  BP: 126/74  Pulse: 89   the general appearance reveals a well-developed, well-nourished woman in no acute distress.Pupils equal and reactive.   Extraocular Movements are full.  There is no scleral icterus.  The mouth and pharynx are normal.  The neck is supple.  The carotids reveal no bruits.  The  jugular venous pressure is normal.  The thyroid is not enlarged.  There is no lymphadenopathy.  Chest reveals bilateral crackling pulmonary ralesThe precordium is quiet.  The first heart sound is normal.  The second heart sound is physiologically split.  There is no murmur gallop rub or click.  There is no abnormal lift or heave.  The abdomen is soft and nontender. Bowel sounds are normal. The liver and spleen are not enlarged. There Are no abdominal masses. There are no bruits.  The pedal pulses are good.  There is no phlebitis or edema.  There is no cyanosis or clubbing.  Strength is normal and symmetrical in all extremities.  There is no lateralizing weakness.  There are no sensory deficits.  EKG today shows normal sinus rhythm and no ischemic changes   Assessment / Plan: Continue same medication.  Recheck in one year for office visit and EKG.

## 2012-10-08 NOTE — Patient Instructions (Addendum)
Your physician recommends that you continue on your current medications as directed. Please refer to the Current Medication list given to you today.  Your physician wants you to follow-up in: 1 YEAR OV/EKG  You will receive a reminder letter in the mail two months in advance. If you don't receive a letter, please call our office to schedule the follow-up appointment.  

## 2012-10-15 ENCOUNTER — Other Ambulatory Visit: Payer: Self-pay | Admitting: Family

## 2012-10-15 ENCOUNTER — Other Ambulatory Visit: Payer: Self-pay | Admitting: Internal Medicine

## 2012-11-01 ENCOUNTER — Ambulatory Visit (INDEPENDENT_AMBULATORY_CARE_PROVIDER_SITE_OTHER): Payer: Medicare Other | Admitting: Critical Care Medicine

## 2012-11-01 ENCOUNTER — Encounter: Payer: Self-pay | Admitting: Critical Care Medicine

## 2012-11-01 VITALS — BP 116/70 | HR 69 | Temp 98.1°F | Ht 62.0 in | Wt 121.0 lb

## 2012-11-01 DIAGNOSIS — Z23 Encounter for immunization: Secondary | ICD-10-CM | POA: Diagnosis not present

## 2012-11-01 DIAGNOSIS — J438 Other emphysema: Secondary | ICD-10-CM | POA: Diagnosis not present

## 2012-11-01 DIAGNOSIS — J439 Emphysema, unspecified: Secondary | ICD-10-CM

## 2012-11-01 MED ORDER — BUDESONIDE-FORMOTEROL FUMARATE 160-4.5 MCG/ACT IN AERO: 2.0000 | INHALATION_SPRAY | Freq: Two times a day (BID) | RESPIRATORY_TRACT | Status: DC

## 2012-11-01 MED ORDER — ALBUTEROL SULFATE HFA 108 (90 BASE) MCG/ACT IN AERS: 1.0000 | INHALATION_SPRAY | Freq: Three times a day (TID) | RESPIRATORY_TRACT | Status: DC | PRN

## 2012-11-01 NOTE — Assessment & Plan Note (Signed)
Gold stage C. COPD stable at this time Plan Maintain inhaled medications as prescribed Administer flu vaccine

## 2012-11-01 NOTE — Patient Instructions (Addendum)
Stop spiriva Stay on symbicort, refills sent Albuterol as needed Flu vaccine was given Return 6 months

## 2012-11-01 NOTE — Progress Notes (Signed)
Subjective:    Patient ID: [name] [name], female    DOB: [birth year], 79 y.o.   MRN: [id number]  HPI 79 y.o.WF with COPD   11/01/2012 Chief Complaint  Patient presents with  . Follow-up    last seen 2013.  Breathing is unchanged.  Does have SOB when walking distances and wheezing at times.  No cough or chest tightness.   gradually worse. No real cough. No real chest pain .  Gets dyspneic if in a hurry.   Pt denies any significant sore throat, nasal congestion or excess secretions, fever, chills, sweats, unintended weight loss, pleurtic or exertional chest pain, orthopnea PND, or leg swelling Pt denies any increase in rescue therapy over baseline, denies waking up needing it or having any early am or nocturnal exacerbations of coughing/wheezing/or dyspnea. Pt also denies any obvious fluctuation in symptoms with  weather or environmental change or other alleviating or aggravating factors    Past Medical History  Diagnosis Date  . Osteoporosis   . Arthritis     osteoarthritis  . GERD (gastroesophageal reflux disease)   . Hiatal hernia   . COPD (chronic obstructive pulmonary disease)   . PUD (peptic ulcer disease)   . Cardiac arrhythmia   . Hyperlipidemia   . Heart failure   . Hypertension   . Scoliosis deformity of spine     history of intractable back pain  . Choledocholithiasis   . Pancolitis     Infectious vs. inflammatory  . Abdominal pain     Resolved  . Chronic anemia   . Chronic back pain   . Pancreatitis     History of  . CHF (congestive heart failure)      Family History  Problem Relation Age of Onset  . Cancer Mother     uterine  . Heart disease Father   . Hypertension Other      History   Social History  . Marital Status: Widowed    Spouse Name: N/A    Number of Children: 3  . Years of Education: N/A   Occupational History  .     Social History Main Topics  . Smoking status: Former Smoker -- 1.00 packs/day for 12 years    Types: Cigarettes    Quit date: 01/28/1988  . Smokeless tobacco: Never Used  . Alcohol Use: No  . Drug Use: No  . Sexual Activity: No   Other Topics Concern  . Not on file   Social History Narrative   1 grandchild at care link--Angelia   Lives with great-granddaughter     Allergies  Allergen Reactions  . Codeine     REACTION: n/v/d, HA  . Morphine     REACTION: n/v/d, HA  . Sulfa Antibiotics   . Zoledronic Acid     REACTION: Severe edema     Outpatient Prescriptions Prior to Visit  Medication Sig Dispense Refill  . Ascorbic Acid (VITAMIN C) 500 MG tablet Take 500 mg by mouth daily.        . Calcium Carbonate-Vitamin D (CALCIUM 600-D) 600-400 MG-UNIT per tablet Take 1 tablet by mouth 2 (two) times daily with a meal.        . cyclobenzaprine (FLEXERIL) 10 MG tablet Take 1 tablet by mouth 3 (three) times daily as needed.       . diclofenac sodium (VOLTAREN) 1 % GEL Apply 1 application topically 4 (four) times daily as needed. Inflammation  100 g  2  . ferrous fumarate-iron polysaccharide  complex (TANDEM) 162-115.2 MG CAPS Take 1 capsule by mouth daily.        [name] HYDROmorphone (DILAUDID) 2 MG tablet Take 1 tablet (2 mg total) by mouth as needed. pain  30 tablet  0  . hyoscyamine (ANASPAZ) 0.125 MG TBDP Place 0.125 mg under the tongue daily.      . Lactulose SOLN Take 2 mLs by mouth at bedtime as needed.       . nitroGLYCERIN (NITROSTAT) 0.4 MG SL tablet Place 1 tablet (0.4 mg total) under the tongue every 5 (five) minutes as needed for chest pain.  100 tablet  3  . NON FORMULARY OXYGEN. 2L at night       . omeprazole (PRILOSEC) 40 MG capsule TAKE 1 CAPSULE BY MOUTH 2 TIMES DAILY  60 capsule  3  . promethazine (PHENERGAN) 25 MG tablet Take 1 tablet (25 mg total) by mouth as needed. Nausea  30 tablet  0  . Vitamin D, Ergocalciferol, (DRISDOL) 50000 UNITS CAPS Take 50,000 Units by mouth every 7 (seven) days.      [name] albuterol (VENTOLIN HFA) 108 (90 BASE) MCG/ACT inhaler Inhale 1 puff into the lungs 3  (three) times daily as needed.        . budesonide-formoterol (SYMBICORT) 160-4.5 MCG/ACT inhaler Inhale 2 puffs into the lungs 2 (two) times daily.        [name] gabapentin (NEURONTIN) 100 MG capsule TAKE 1 CAPSULE BY MOUTH THREE TIMES A DAY  90 capsule  3  . tiotropium (SPIRIVA) 18 MCG inhalation capsule Place 18 mcg into inhaler and inhale daily as needed.       . fluticasone (FLONASE) 50 MCG/ACT nasal spray       . Diclofenac Potassium 50 MG PACK Take 1-2 each by mouth daily.      . metroNIDAZOLE (FLAGYL) 500 MG tablet Take 1 tablet (500 mg total) by mouth every 8 (eight) hours.  6 tablet  0  . nebivolol (BYSTOLIC) 5 MG tablet Take 5 mg by mouth daily. Take 1/2 tablet by mouth daily.       No facility-administered medications prior to visit.      Review of Systems  Constitutional:   No  weight loss, night sweats,  Fevers, chills,  +fatigue, lassitude. HEENT:   No headaches,  Difficulty swallowing,  Tooth/dental problems,  Sore throat,                No sneezing, itching, ear ache, nasal congestion, post nasal drip,   CV:  No chest pain,  Orthopnea, PND, swelling in lower extremities, anasarca, dizziness, palpitations  GI  No heartburn, indigestion, abdominal pain, nausea, vomiting, diarrhea, change in bowel habits, loss of appetite  Resp: Notes shortness of breath with exertion and at rest.    No coughing up of blood.  No change in color of mucus.  No wheezing.  No chest wall deformity  Skin: no rash or lesions.  GU: no dysuria, change in color of urine, no urgency or frequency.  No flank pain.  MS:  No joint pain or swelling.  No decreased range of motion.  No back pain.  Psych:  No change in mood or affect. No depression or anxiety.  No memory loss.     Objective:   Physical Exam  Filed Vitals:   11/01/12 1415  BP: 116/70  Pulse: 69  Temp: 98.1 F (36.7 C)  TempSrc: Oral  Height: 5\' 2"  (1.575 m)  Weight: 121 lb (54.885 kg)  SpO2: 94%    Gen: Pleasant,  well-nourished, in no distress,  normal affect  ENT: No lesions,  mouth clear,  oropharynx clear, no postnasal drip  Neck: No JVD, no TMG, no carotid bruits  Lungs: No use of accessory muscles, no dullness to percussion,no wheeze, coarse BS   Cardiovascular: RRR, heart sounds normal, no murmur or gallops, no peripheral edema  Abdomen: soft and NT, no HSM,  BS normal  Musculoskeletal: No deformities, no cyanosis or clubbing  Neuro: alert, non focal  Skin: Warm, no lesions or rashes        Assessment & Plan:   COPD with emphysema, gold stage C. Gold stage C. COPD stable at this time Plan Maintain inhaled medications as prescribed Administer flu vaccine     Updated Medication List Outpatient Encounter Prescriptions as of 11/01/2012  Medication Sig Dispense Refill  . albuterol (VENTOLIN HFA) 108 (90 BASE) MCG/ACT inhaler Inhale 1 puff into the lungs 3 (three) times daily as needed.  1 Inhaler  6  . Ascorbic Acid (VITAMIN C) 500 MG tablet Take 500 mg by mouth daily.        . budesonide-formoterol (SYMBICORT) 160-4.5 MCG/ACT inhaler Inhale 2 puffs into the lungs 2 (two) times daily.  1 Inhaler  11  . Calcium Carbonate-Vitamin D (CALCIUM 600-D) 600-400 MG-UNIT per tablet Take 1 tablet by mouth 2 (two) times daily with a meal.        . cyclobenzaprine (FLEXERIL) 10 MG tablet Take 1 tablet by mouth 3 (three) times daily as needed.       . diclofenac sodium (VOLTAREN) 1 % GEL Apply 1 application topically 4 (four) times daily as needed. Inflammation  100 g  2  . ferrous fumarate-iron polysaccharide complex (TANDEM) 162-115.2 MG CAPS Take 1 capsule by mouth daily.        [name] gabapentin (NEURONTIN) 100 MG capsule Take 100 mg by mouth 2 (two) times daily.       [name] HYDROmorphone (DILAUDID) 2 MG tablet Take 1 tablet (2 mg total) by mouth as needed. pain  30 tablet  0  . hyoscyamine (ANASPAZ) 0.125 MG TBDP Place 0.125 mg under the tongue daily.      . Lactulose SOLN Take 2 mLs by mouth at  bedtime as needed.       . nitroGLYCERIN (NITROSTAT) 0.4 MG SL tablet Place 1 tablet (0.4 mg total) under the tongue every 5 (five) minutes as needed for chest pain.  100 tablet  3  . NON FORMULARY OXYGEN. 2L at night       . omeprazole (PRILOSEC) 40 MG capsule TAKE 1 CAPSULE BY MOUTH 2 TIMES DAILY  60 capsule  3  . promethazine (PHENERGAN) 25 MG tablet Take 1 tablet (25 mg total) by mouth as needed. Nausea  30 tablet  0  . Vitamin D, Ergocalciferol, (DRISDOL) 50000 UNITS CAPS Take 50,000 Units by mouth every 7 (seven) days.      . [DISCONTINUED] albuterol (VENTOLIN HFA) 108 (90 BASE) MCG/ACT inhaler Inhale 1 puff into the lungs 3 (three) times daily as needed.        . [DISCONTINUED] budesonide-formoterol (SYMBICORT) 160-4.5 MCG/ACT inhaler Inhale 2 puffs into the lungs 2 (two) times daily.        . [DISCONTINUED] gabapentin (NEURONTIN) 100 MG capsule TAKE 1 CAPSULE BY MOUTH THREE TIMES A DAY  90 capsule  3  . [DISCONTINUED] tiotropium (SPIRIVA) 18 MCG inhalation capsule Place 18 mcg into inhaler and inhale daily as needed.       [name]  fluticasone (FLONASE) 50 MCG/ACT nasal spray       . [DISCONTINUED] Diclofenac Potassium 50 MG PACK Take 1-2 each by mouth daily.      . [DISCONTINUED] metroNIDAZOLE (FLAGYL) 500 MG tablet Take 1 tablet (500 mg total) by mouth every 8 (eight) hours.  6 tablet  0  . [DISCONTINUED] nebivolol (BYSTOLIC) 5 MG tablet Take 5 mg by mouth daily. Take 1/2 tablet by mouth daily.       No facility-administered encounter medications on file as of 11/01/2012.

## 2012-11-11 DIAGNOSIS — M5137 Other intervertebral disc degeneration, lumbosacral region: Secondary | ICD-10-CM | POA: Diagnosis not present

## 2012-11-11 DIAGNOSIS — M545 Low back pain, unspecified: Secondary | ICD-10-CM | POA: Diagnosis not present

## 2012-11-11 DIAGNOSIS — R51 Headache: Secondary | ICD-10-CM | POA: Diagnosis not present

## 2012-11-11 DIAGNOSIS — G894 Chronic pain syndrome: Secondary | ICD-10-CM | POA: Diagnosis not present

## 2012-11-16 ENCOUNTER — Ambulatory Visit: Payer: Medicare Other | Admitting: Family

## 2012-12-28 ENCOUNTER — Ambulatory Visit (INDEPENDENT_AMBULATORY_CARE_PROVIDER_SITE_OTHER): Payer: Medicare Other | Admitting: Family

## 2012-12-28 ENCOUNTER — Other Ambulatory Visit: Payer: Self-pay | Admitting: Family

## 2012-12-28 ENCOUNTER — Encounter: Payer: Self-pay | Admitting: Family

## 2012-12-28 VITALS — BP 100/70 | HR 83 | Temp 97.9°F | Resp 16 | Ht 60.0 in | Wt 118.0 lb

## 2012-12-28 DIAGNOSIS — R413 Other amnesia: Secondary | ICD-10-CM | POA: Diagnosis not present

## 2012-12-28 DIAGNOSIS — Z23 Encounter for immunization: Secondary | ICD-10-CM

## 2012-12-28 NOTE — Assessment & Plan Note (Signed)
Unchanged.  Pt completed MMSE today and scored 23/30 placing her in the mild cognitive impairment category.  We discussed checking b12/folate, rpr/tsh. She is agreeable. Discussed referral to neurologist to discuss possibility of adding aricept. She declines referral at this time.

## 2012-12-28 NOTE — Progress Notes (Signed)
Pre visit review using our clinic review tool, if applicable. No additional management support is needed unless otherwise documented below in the visit note. 

## 2012-12-28 NOTE — Progress Notes (Signed)
Subjective:    Patient ID: [name] [name], female    DOB: 05/18/1925, 79 y.o.   MRN: [id number]  HPI  [name] [name] is an 79 yr old female who presents today for follow up of her memory loss.  She presents today with her daughter with whom she lives.  Pt and daughter noted no change in the patient's memory since last visit. Daughter reports that she sometimes forget's things like if someone has updated there house remotely, she thinks its new.  Trouble remembering names. Long term memory is good.    Review of Systems See HPI  Past Medical History  Diagnosis Date  . Osteoporosis   . Arthritis     osteoarthritis  . GERD (gastroesophageal reflux disease)   . Hiatal hernia   . COPD (chronic obstructive pulmonary disease)   . PUD (peptic ulcer disease)   . Cardiac arrhythmia   . Hyperlipidemia   . Heart failure   . Hypertension   . Scoliosis deformity of spine     history of intractable back pain  . Choledocholithiasis   . Pancolitis     Infectious vs. inflammatory  . Abdominal pain     Resolved  . Chronic anemia   . Chronic back pain   . Pancreatitis     History of  . CHF (congestive heart failure)     History   Social History  . Marital Status: Widowed    Spouse Name: N/A    Number of Children: 3  . Years of Education: N/A   Occupational History  .     Social History Main Topics  . Smoking status: Former Smoker -- 1.00 packs/day for 12 years    Types: Cigarettes    Quit date: 01/28/1988  . Smokeless tobacco: Never Used  . Alcohol Use: No  . Drug Use: No  . Sexual Activity: No   Other Topics Concern  . Not on file   Social History Narrative   1 grandchild at care link--Angelia   Lives with great-granddaughter    Past Surgical History  Procedure Laterality Date  . Cholecystectomy  2008  . Abdominal hysterectomy  1970s  . Spine surgery  [phone]  . Neck surgery  1970s  . Spinal cord stimulator implant    . Ercp w/ sphicterotomy  02/2010  .  Back surgery      x 5    Family History  Problem Relation Age of Onset  . Cancer Mother     uterine  . Heart disease Father   . Hypertension Other     Allergies  Allergen Reactions  . Codeine     REACTION: n/v/d, HA  . Morphine     REACTION: n/v/d, HA  . Sulfa Antibiotics   . Zoledronic Acid     REACTION: Severe edema    Current Outpatient Prescriptions on File Prior to Visit  Medication Sig Dispense Refill  . albuterol (VENTOLIN HFA) 108 (90 BASE) MCG/ACT inhaler Inhale 1 puff into the lungs 3 (three) times daily as needed.  1 Inhaler  6  . Ascorbic Acid (VITAMIN C) 500 MG tablet Take 500 mg by mouth daily.        . budesonide-formoterol (SYMBICORT) 160-4.5 MCG/ACT inhaler Inhale 2 puffs into the lungs 2 (two) times daily.  1 Inhaler  11  . Calcium Carbonate-Vitamin D (CALCIUM 600-D) 600-400 MG-UNIT per tablet Take 1 tablet by mouth 2 (two) times daily with a meal.        .  cyclobenzaprine (FLEXERIL) 10 MG tablet Take 1 tablet by mouth 3 (three) times daily as needed.       . diclofenac sodium (VOLTAREN) 1 % GEL Apply 1 application topically 4 (four) times daily as needed. Inflammation  100 g  2  . ferrous fumarate-iron polysaccharide complex (TANDEM) 162-115.2 MG CAPS Take 1 capsule by mouth daily.        . fluticasone (FLONASE) 50 MCG/ACT nasal spray       . gabapentin (NEURONTIN) 100 MG capsule Take 100 mg by mouth 2 (two) times daily.       [name] HYDROmorphone (DILAUDID) 2 MG tablet Take 1 tablet (2 mg total) by mouth as needed. pain  30 tablet  0  . hyoscyamine (ANASPAZ) 0.125 MG TBDP Place 0.125 mg under the tongue daily.      . Lactulose SOLN Take 2 mLs by mouth at bedtime as needed.       . nitroGLYCERIN (NITROSTAT) 0.4 MG SL tablet Place 1 tablet (0.4 mg total) under the tongue every 5 (five) minutes as needed for chest pain.  100 tablet  3  . NON FORMULARY OXYGEN. 2L at night       . omeprazole (PRILOSEC) 40 MG capsule TAKE 1 CAPSULE BY MOUTH 2 TIMES DAILY  60 capsule  3   . promethazine (PHENERGAN) 25 MG tablet Take 1 tablet (25 mg total) by mouth as needed. Nausea  30 tablet  0  . Vitamin D, Ergocalciferol, (DRISDOL) 50000 UNITS CAPS Take 50,000 Units by mouth every 7 (seven) days.       No current facility-administered medications on file prior to visit.    BP 100/70  Pulse 83  Temp(Src) 97.9 F (36.6 C) (Oral)  Resp 16  Ht 5' (1.524 m)  Wt 118 lb (53.524 kg)  BMI 23.05 kg/m2  SpO2 94%       Objective:   Physical Exam  Constitutional: She appears well-developed and well-nourished. No distress.  Neurological:  Oriented to person, place, not oriented to date          Assessment & Plan:

## 2012-12-28 NOTE — Patient Instructions (Signed)
Please complete lab work prior to leaving. Follow up in 3 months, sooner if problems/concerns.  

## 2012-12-29 LAB — SYPHILIS: RPR W/REFLEX TO RPR TITER AND TREPONEMAL ANTIBODIES, TRADITIONAL SCREENING AND DIAGNOSIS ALGORITHM

## 2012-12-29 LAB — TSH: TSH: 1.275 u[IU]/mL (ref 0.350–4.500)

## 2012-12-30 ENCOUNTER — Encounter: Payer: Self-pay | Admitting: Family

## 2013-01-03 DIAGNOSIS — R51 Headache: Secondary | ICD-10-CM | POA: Diagnosis not present

## 2013-01-03 DIAGNOSIS — Z79899 Other long term (current) drug therapy: Secondary | ICD-10-CM | POA: Diagnosis not present

## 2013-01-03 DIAGNOSIS — M545 Low back pain, unspecified: Secondary | ICD-10-CM | POA: Diagnosis not present

## 2013-01-03 DIAGNOSIS — M5137 Other intervertebral disc degeneration, lumbosacral region: Secondary | ICD-10-CM | POA: Diagnosis not present

## 2013-01-21 ENCOUNTER — Encounter (HOSPITAL_COMMUNITY): Payer: Self-pay | Admitting: Emergency Medicine

## 2013-01-21 ENCOUNTER — Emergency Department (HOSPITAL_COMMUNITY): Payer: Medicare Other

## 2013-01-21 ENCOUNTER — Inpatient Hospital Stay (HOSPITAL_COMMUNITY): Payer: Medicare Other

## 2013-01-21 ENCOUNTER — Inpatient Hospital Stay (HOSPITAL_COMMUNITY)
Admission: EM | Admit: 2013-01-21 | Discharge: 2013-01-27 | DRG: 386 | Disposition: A | Discharge status: Home / Self Care | Payer: Medicare Other | Attending: Internal Medicine | Admitting: Internal Medicine

## 2013-01-21 DIAGNOSIS — I10 Essential (primary) hypertension: Secondary | ICD-10-CM

## 2013-01-21 DIAGNOSIS — E785 Hyperlipidemia, unspecified: Secondary | ICD-10-CM | POA: Diagnosis present

## 2013-01-21 DIAGNOSIS — R109 Unspecified abdominal pain: Secondary | ICD-10-CM | POA: Diagnosis not present

## 2013-01-21 DIAGNOSIS — J439 Emphysema, unspecified: Secondary | ICD-10-CM

## 2013-01-21 DIAGNOSIS — M549 Dorsalgia, unspecified: Secondary | ICD-10-CM | POA: Diagnosis not present

## 2013-01-21 DIAGNOSIS — K5900 Constipation, unspecified: Secondary | ICD-10-CM | POA: Diagnosis present

## 2013-01-21 DIAGNOSIS — R000 Tachycardia, unspecified: Secondary | ICD-10-CM

## 2013-01-21 DIAGNOSIS — R112 Nausea with vomiting, unspecified: Secondary | ICD-10-CM

## 2013-01-21 DIAGNOSIS — M1990 Unspecified osteoarthritis, unspecified site: Secondary | ICD-10-CM | POA: Diagnosis present

## 2013-01-21 DIAGNOSIS — Z79899 Other long term (current) drug therapy: Secondary | ICD-10-CM | POA: Diagnosis not present

## 2013-01-21 DIAGNOSIS — M810 Age-related osteoporosis without current pathological fracture: Secondary | ICD-10-CM | POA: Diagnosis present

## 2013-01-21 DIAGNOSIS — Z8249 Family history of ischemic heart disease and other diseases of the circulatory system: Secondary | ICD-10-CM | POA: Diagnosis not present

## 2013-01-21 DIAGNOSIS — J449 Chronic obstructive pulmonary disease, unspecified: Secondary | ICD-10-CM | POA: Diagnosis present

## 2013-01-21 DIAGNOSIS — K5289 Other specified noninfective gastroenteritis and colitis: Secondary | ICD-10-CM | POA: Diagnosis not present

## 2013-01-21 DIAGNOSIS — Z87891 Personal history of nicotine dependence: Secondary | ICD-10-CM

## 2013-01-21 DIAGNOSIS — I5032 Chronic diastolic (congestive) heart failure: Secondary | ICD-10-CM | POA: Diagnosis present

## 2013-01-21 DIAGNOSIS — R51 Headache: Secondary | ICD-10-CM | POA: Diagnosis not present

## 2013-01-21 DIAGNOSIS — J90 Pleural effusion, not elsewhere classified: Secondary | ICD-10-CM | POA: Diagnosis not present

## 2013-01-21 DIAGNOSIS — J4489 Other specified chronic obstructive pulmonary disease: Secondary | ICD-10-CM | POA: Diagnosis present

## 2013-01-21 DIAGNOSIS — G44229 Chronic tension-type headache, not intractable: Secondary | ICD-10-CM | POA: Diagnosis present

## 2013-01-21 DIAGNOSIS — J438 Other emphysema: Secondary | ICD-10-CM | POA: Diagnosis not present

## 2013-01-21 DIAGNOSIS — I498 Other specified cardiac arrhythmias: Secondary | ICD-10-CM | POA: Diagnosis not present

## 2013-01-21 DIAGNOSIS — K529 Noninfective gastroenteritis and colitis, unspecified: Secondary | ICD-10-CM

## 2013-01-21 DIAGNOSIS — K219 Gastro-esophageal reflux disease without esophagitis: Secondary | ICD-10-CM | POA: Diagnosis not present

## 2013-01-21 DIAGNOSIS — R1110 Vomiting, unspecified: Secondary | ICD-10-CM | POA: Diagnosis not present

## 2013-01-21 DIAGNOSIS — I509 Heart failure, unspecified: Secondary | ICD-10-CM

## 2013-01-21 DIAGNOSIS — G8929 Other chronic pain: Secondary | ICD-10-CM | POA: Diagnosis present

## 2013-01-21 DIAGNOSIS — E876 Hypokalemia: Secondary | ICD-10-CM | POA: Diagnosis present

## 2013-01-21 DIAGNOSIS — R1011 Right upper quadrant pain: Secondary | ICD-10-CM | POA: Diagnosis not present

## 2013-01-21 DIAGNOSIS — D649 Anemia, unspecified: Secondary | ICD-10-CM | POA: Diagnosis not present

## 2013-01-21 DIAGNOSIS — K5100 Ulcerative (chronic) pancolitis without complications: Secondary | ICD-10-CM | POA: Diagnosis not present

## 2013-01-21 DIAGNOSIS — D126 Benign neoplasm of colon, unspecified: Secondary | ICD-10-CM | POA: Diagnosis not present

## 2013-01-21 DIAGNOSIS — R1013 Epigastric pain: Secondary | ICD-10-CM | POA: Diagnosis not present

## 2013-01-21 LAB — URINALYSIS, ROUTINE W REFLEX MICROSCOPIC
Bilirubin Urine: NEGATIVE
Glucose, UA: NEGATIVE mg/dL
Ketones, ur: 15 mg/dL — AB
Leukocytes, UA: NEGATIVE
Nitrite: NEGATIVE
Protein, ur: NEGATIVE mg/dL
Specific Gravity, Urine: 1.015 (ref 1.005–1.030)
Urobilinogen, UA: 0.2 mg/dL (ref 0.0–1.0)
pH: 7 (ref 5.0–8.0)

## 2013-01-21 LAB — COMPREHENSIVE METABOLIC PANEL
ALT: 13 U/L (ref 0–35)
AST: 23 U/L (ref 0–37)
Albumin: 3.8 g/dL (ref 3.5–5.2)
Alkaline Phosphatase: 90 U/L (ref 39–117)
BUN: 12 mg/dL (ref 6–23)
CO2: 29 mEq/L (ref 19–32)
Calcium: 8.6 mg/dL (ref 8.4–10.5)
Chloride: 97 mEq/L (ref 96–112)
Creatinine, Ser: 0.61 mg/dL (ref 0.50–1.10)
GFR calc Af Amer: >90 mL/min (ref >90)
GFR calc non Af Amer: 80 mL/min — ABNORMAL LOW (ref >90)
Glucose, Bld: 173 mg/dL — ABNORMAL HIGH (ref 70–99)
Potassium: 3.4 mEq/L — ABNORMAL LOW (ref 3.5–5.1)
Sodium: 138 mEq/L (ref 135–145)
Total Bilirubin: 0.1 mg/dL — ABNORMAL LOW (ref 0.3–1.2)
Total Protein: 6.8 g/dL (ref 6.0–8.3)

## 2013-01-21 LAB — CBC WITH DIFFERENTIAL/PLATELET
Basophils Absolute: 0 10*3/uL (ref 0.0–0.1)
Basophils Relative: 0 % (ref 0–1)
Eosinophils Absolute: 0 10*3/uL (ref 0.0–0.7)
Eosinophils Relative: 0 % (ref 0–5)
HCT: 42.1 % (ref 36.0–46.0)
Hemoglobin: 14.1 g/dL (ref 12.0–15.0)
Lymphocytes Relative: 3 % — ABNORMAL LOW (ref 12–46)
Lymphs Abs: 0.2 10*3/uL — ABNORMAL LOW (ref 0.7–4.0)
MCH: 31.7 pg (ref 26.0–34.0)
MCHC: 33.5 g/dL (ref 30.0–36.0)
MCV: 94.6 fL (ref 78.0–100.0)
Monocytes Absolute: 0.6 10*3/uL (ref 0.1–1.0)
Monocytes Relative: 10 % (ref 3–12)
Neutro Abs: 5.3 10*3/uL (ref 1.7–7.7)
Neutrophils Relative %: 87 % — ABNORMAL HIGH (ref 43–77)
Platelets: 131 10*3/uL — ABNORMAL LOW (ref 150–400)
RBC: 4.45 MIL/uL (ref 3.87–5.11)
RDW: 13.5 % (ref 11.5–15.5)
WBC: 6.2 10*3/uL (ref 4.0–10.5)

## 2013-01-21 LAB — TROPONIN I: Troponin I: <0.3 ng/mL (ref <0.30)

## 2013-01-21 LAB — OCCULT BLOOD, POC DEVICE: Fecal Occult Bld: POSITIVE — AB

## 2013-01-21 LAB — LACTIC ACID, PLASMA: Lactic Acid, Venous: 1.8 mmol/L (ref 0.5–2.2)

## 2013-01-21 LAB — LIPASE, BLOOD: Lipase: 11 U/L (ref 11–59)

## 2013-01-21 LAB — PRO B NATRIURETIC PEPTIDE: Pro B Natriuretic peptide (BNP): 911.1 pg/mL — ABNORMAL HIGH (ref 0–450)

## 2013-01-21 LAB — URINE MICROSCOPIC-ADD ON

## 2013-01-21 MED ORDER — CYCLOBENZAPRINE HCL 10 MG PO TABS: 10.0000 mg | ORAL_TABLET | Freq: Three times a day (TID) | ORAL | Status: DC | PRN

## 2013-01-21 MED ORDER — ONDANSETRON HCL 4 MG/2ML IJ SOLN
4.0000 mg | Freq: Once | INTRAMUSCULAR | Status: DC
  Filled 2013-01-21: qty 2

## 2013-01-21 MED ORDER — METOCLOPRAMIDE HCL 5 MG/ML IJ SOLN: 5.0000 mg | Freq: Once | INTRAMUSCULAR | Status: DC

## 2013-01-21 MED ORDER — SODIUM CHLORIDE 0.9 % IV SOLN
INTRAVENOUS | Status: DC
  Administered 2013-01-21: 10:00:00 via INTRAVENOUS

## 2013-01-21 MED ORDER — FLUTICASONE PROPIONATE 50 MCG/ACT NA SUSP
2.0000 | Freq: Every day | NASAL | Status: DC
  Administered 2013-01-25 – 2013-01-27 (×3): 2 via NASAL
  Filled 2013-01-21 (×2): qty 16

## 2013-01-21 MED ORDER — METOPROLOL TARTRATE 1 MG/ML IV SOLN
2.5000 mg | Freq: Three times a day (TID) | INTRAVENOUS | Status: DC | PRN
  Administered 2013-01-21 – 2013-01-23 (×2): 2.5 mg via INTRAVENOUS
  Filled 2013-01-21 (×3): qty 5

## 2013-01-21 MED ORDER — HYDROMORPHONE HCL PF 1 MG/ML IJ SOLN
1.0000 mg | Freq: Once | INTRAMUSCULAR | Status: AC
  Administered 2013-01-21: 1 mg via INTRAVENOUS
  Filled 2013-01-21: qty 1

## 2013-01-21 MED ORDER — DICLOFENAC SODIUM 1 % TD GEL
1.0000 "application " | Freq: Four times a day (QID) | TRANSDERMAL | Status: DC | PRN
  Filled 2013-01-21: qty 100

## 2013-01-21 MED ORDER — ACETAMINOPHEN 650 MG RE SUPP: 650.0000 mg | Freq: Four times a day (QID) | RECTAL | Status: DC | PRN

## 2013-01-21 MED ORDER — SODIUM CHLORIDE 0.9 % IV SOLN
INTRAVENOUS | Status: DC
  Administered 2013-01-21: 17:00:00 via INTRAVENOUS

## 2013-01-21 MED ORDER — BUDESONIDE-FORMOTEROL FUMARATE 160-4.5 MCG/ACT IN AERO
2.0000 | INHALATION_SPRAY | Freq: Two times a day (BID) | RESPIRATORY_TRACT | Status: DC
  Administered 2013-01-21 – 2013-01-27 (×11): 2 via RESPIRATORY_TRACT
  Filled 2013-01-21: qty 6

## 2013-01-21 MED ORDER — IOHEXOL 300 MG/ML  SOLN
80.0000 mL | Freq: Once | INTRAMUSCULAR | Status: AC | PRN
  Administered 2013-01-21: 80 mL via INTRAVENOUS

## 2013-01-21 MED ORDER — ACETAMINOPHEN 325 MG PO TABS
650.0000 mg | ORAL_TABLET | Freq: Four times a day (QID) | ORAL | Status: DC | PRN
  Administered 2013-01-22 – 2013-01-25 (×3): 650 mg via ORAL
  Filled 2013-01-21 (×5): qty 2

## 2013-01-21 MED ORDER — PROMETHAZINE HCL 12.5 MG PO TABS
12.5000 mg | ORAL_TABLET | Freq: Four times a day (QID) | ORAL | Status: DC | PRN
  Filled 2013-01-21 (×2): qty 1

## 2013-01-21 MED ORDER — ONDANSETRON HCL 4 MG/2ML IJ SOLN
4.0000 mg | Freq: Once | INTRAMUSCULAR | Status: AC
  Administered 2013-01-21: 4 mg via INTRAVENOUS
  Filled 2013-01-21: qty 2

## 2013-01-21 MED ORDER — IOHEXOL 300 MG/ML  SOLN: 25.0000 mL | INTRAMUSCULAR | Status: DC

## 2013-01-21 MED ORDER — GABAPENTIN 100 MG PO CAPS
100.0000 mg | ORAL_CAPSULE | Freq: Two times a day (BID) | ORAL | Status: DC
  Administered 2013-01-25 – 2013-01-27 (×5): 100 mg via ORAL
  Filled 2013-01-21 (×14): qty 1

## 2013-01-21 MED ORDER — HYDROMORPHONE HCL PF 1 MG/ML IJ SOLN
0.5000 mg | INTRAMUSCULAR | Status: DC | PRN
  Administered 2013-01-22 – 2013-01-26 (×10): 0.5 mg via INTRAVENOUS
  Filled 2013-01-21 (×10): qty 1

## 2013-01-21 MED ORDER — METOPROLOL TARTRATE 1 MG/ML IV SOLN
5.0000 mg | Freq: Once | INTRAVENOUS | Status: AC
  Administered 2013-01-21: 5 mg via INTRAVENOUS
  Filled 2013-01-21: qty 5

## 2013-01-21 MED ORDER — SODIUM CHLORIDE 0.9 % IJ SOLN
3.0000 mL | Freq: Two times a day (BID) | INTRAMUSCULAR | Status: DC
  Administered 2013-01-23 – 2013-01-26 (×3): 3 mL via INTRAVENOUS

## 2013-01-21 MED ORDER — PANTOPRAZOLE SODIUM 40 MG IV SOLR
40.0000 mg | Freq: Two times a day (BID) | INTRAVENOUS | Status: DC
  Administered 2013-01-22 – 2013-01-26 (×8): 40 mg via INTRAVENOUS
  Filled 2013-01-21 (×11): qty 40

## 2013-01-21 MED ORDER — METRONIDAZOLE IN NACL 5-0.79 MG/ML-% IV SOLN
500.0000 mg | Freq: Three times a day (TID) | INTRAVENOUS | Status: DC
  Administered 2013-01-21 – 2013-01-24 (×9): 500 mg via INTRAVENOUS
  Filled 2013-01-21 (×11): qty 100

## 2013-01-21 MED ORDER — METOCLOPRAMIDE HCL 5 MG/ML IJ SOLN
5.0000 mg | Freq: Once | INTRAMUSCULAR | Status: DC
  Filled 2013-01-21: qty 2

## 2013-01-21 MED ORDER — BUTALBITAL-APAP-CAFFEINE 50-325-40 MG PO TABS
1.0000 | ORAL_TABLET | Freq: Three times a day (TID) | ORAL | Status: DC | PRN
  Administered 2013-01-25 – 2013-01-27 (×4): 1 via ORAL
  Filled 2013-01-21 (×5): qty 1

## 2013-01-21 MED ORDER — SODIUM CHLORIDE 0.9 % IV SOLN
INTRAVENOUS | Status: DC
  Administered 2013-01-22 – 2013-01-23 (×3): via INTRAVENOUS
  Administered 2013-01-23 – 2013-01-24 (×2): 125 mL/h via INTRAVENOUS
  Administered 2013-01-25: 125 mL via INTRAVENOUS
  Administered 2013-01-25: 16:00:00 via INTRAVENOUS

## 2013-01-21 MED ORDER — ALBUTEROL SULFATE HFA 108 (90 BASE) MCG/ACT IN AERS
1.0000 | INHALATION_SPRAY | Freq: Three times a day (TID) | RESPIRATORY_TRACT | Status: DC | PRN
  Administered 2013-01-23: 2 via RESPIRATORY_TRACT

## 2013-01-21 MED ORDER — POTASSIUM CHLORIDE 10 MEQ/100ML IV SOLN
10.0000 meq | INTRAVENOUS | Status: AC
  Administered 2013-01-21 (×3): 10 meq via INTRAVENOUS
  Filled 2013-01-21 (×3): qty 100

## 2013-01-21 MED ORDER — HYDRALAZINE HCL 20 MG/ML IJ SOLN
10.0000 mg | Freq: Three times a day (TID) | INTRAMUSCULAR | Status: DC | PRN
  Administered 2013-01-21: 10 mg via INTRAVENOUS
  Filled 2013-01-21 (×2): qty 1

## 2013-01-21 MED ORDER — CIPROFLOXACIN IN D5W 400 MG/200ML IV SOLN
400.0000 mg | Freq: Two times a day (BID) | INTRAVENOUS | Status: DC
  Administered 2013-01-21 – 2013-01-26 (×10): 400 mg via INTRAVENOUS
  Filled 2013-01-21 (×13): qty 200

## 2013-01-21 NOTE — ED Notes (Signed)
NOTIFIED DR . Ethelda Chick IN PERSON FOR PATIENTS LAB RESULTS OF OCCULT STOOL BLOOD CARD = POSITIVE ( + ) RESULTS ,@12 :30 PM ,01/21/2013.

## 2013-01-21 NOTE — H&P (Signed)
[facility] History and Physical  [name] [name] GNF:[id number] DOB: 11-11-1925 DOA: 01/21/2013  Referring physician: Dr Ethelda Chick PCP: Lemont Fillers., NP   Chief Complaint: abdominal pain, vomiting.   HPI: [name] [name] is a 79 y.o. female with PMH significant for Diastolic heart failure EF 60 % by ECHO 2013, scoliosis deformity of the spine intractable pain on dilaudid  PCA pump, prior history of pan-colitis 09-2011 who presents with nausea, multiple episodes of vomiting, fluid content, abdominal pain that started night prior to admission. She denies diarrhea.  She is also complaining of her chronic headaches.    Review of Systems:  Negative except as per HPI.   Past Medical History  Diagnosis Date  . Osteoporosis   . Arthritis     osteoarthritis  . GERD (gastroesophageal reflux disease)   . Hiatal hernia   . COPD (chronic obstructive pulmonary disease)   . PUD (peptic ulcer disease)   . Cardiac arrhythmia   . Hyperlipidemia   . Heart failure   . Hypertension   . Scoliosis deformity of spine     history of intractable back pain  . Choledocholithiasis   . Pancolitis     Infectious vs. inflammatory  . Abdominal pain     Resolved  . Chronic anemia   . Chronic back pain   . Pancreatitis     History of  . CHF (congestive heart failure)    Past Surgical History  Procedure Laterality Date  . Cholecystectomy  2008  . Abdominal hysterectomy  1970s  . Spine surgery  [phone]  . Neck surgery  1970s  . Spinal cord stimulator implant    . Ercp w/ sphicterotomy  02/2010  . Back surgery      x 5   Social History:  reports that she quit smoking about 25 years ago. Her smoking use included Cigarettes. She has a 12 pack-year smoking history. She has never used smokeless tobacco. She reports that she does not drink alcohol or use illicit drugs.  Allergies  Allergen Reactions  . Codeine     REACTION: n/v/d, HA  . Morphine     REACTION: n/v/d, HA  .  Sulfa Antibiotics   . Zoledronic Acid     REACTION: Severe edema    Family History  Problem Relation Age of Onset  . Cancer Mother     uterine  . Heart disease Father   . Hypertension Other      Prior to Admission medications   Medication Sig Start Date End Date Taking? Authorizing Provider  albuterol (PROVENTIL HFA;VENTOLIN HFA) 108 (90 BASE) MCG/ACT inhaler Inhale 1-2 puffs into the lungs 3 (three) times daily as needed for wheezing or shortness of breath.   Yes Historical Provider, MD  Ascorbic Acid (VITAMIN C) 500 MG tablet Take 500 mg by mouth daily.     Yes Historical Provider, MD  budesonide-formoterol (SYMBICORT) 160-4.5 MCG/ACT inhaler Inhale 2 puffs into the lungs 2 (two) times daily. 11/01/12  Yes Storm Frisk, MD  butalbital-acetaminophen-caffeine (FIORICET, ESGIC) [phone] MG per tablet Take 1 tablet by mouth every 8 (eight) hours as needed for headache.   Yes Historical Provider, MD  Calcium Carbonate-Vitamin D (CALCIUM 600-D) 600-400 MG-UNIT per tablet Take 1 tablet by mouth 2 (two) times daily with a meal.     Yes Historical Provider, MD  cyclobenzaprine (FLEXERIL) 10 MG tablet Take 1 tablet by mouth 3 (three) times daily as needed for muscle spasms.  07/31/10  Yes Historical Provider, MD  diclofenac sodium (VOLTAREN) 1 % GEL Apply 1 application topically 4 (four) times daily as needed. Inflammation 09/15/12  Yes Sandford Craze, NP  ferrous fumarate-iron polysaccharide complex (TANDEM) 162-115.2 MG CAPS Take 1 capsule by mouth daily.     Yes Historical Provider, MD  fluticasone (FLONASE) 50 MCG/ACT nasal spray Place 2 sprays into both nostrils daily.  03/07/11  Yes Edwyna Perfect, MD  gabapentin (NEURONTIN) 100 MG capsule Take 100 mg by mouth 2 (two) times daily.  10/15/12  Yes Sandford Craze, NP  HYDROmorphone (DILAUDID) 4 MG tablet Take 4 mg by mouth 2 (two) times daily as needed for moderate pain or severe pain.   Yes Historical Provider, MD  hyoscyamine (ANASPAZ)  0.125 MG TBDP Place 0.125 mg under the tongue daily.   Yes Historical Provider, MD  Lactulose SOLN Take 2 mLs by mouth at bedtime as needed (for constipation).  06/06/10  Yes Doe-Hyun R Artist Pais, DO  nitroGLYCERIN (NITROSTAT) 0.4 MG SL tablet Place 1 tablet (0.4 mg total) under the tongue every 5 (five) minutes as needed for chest pain. 11/11/10  Yes Cassell Clement, MD  omeprazole (PRILOSEC) 40 MG capsule Take 40 mg by mouth 2 (two) times daily.   Yes Historical Provider, MD  oxyCODONE-acetaminophen (PERCOCET/ROXICET) 5-325 MG per tablet Take 1 tablet by mouth every 4 (four) hours as needed for moderate pain or severe pain.   Yes Historical Provider, MD  promethazine (PHENERGAN) 25 MG tablet Take 1 tablet (25 mg total) by mouth as needed. Nausea 08/11/12  Yes Sandford Craze, NP  Vitamin D, Ergocalciferol, (DRISDOL) 50000 UNITS CAPS Take 50,000 Units by mouth every 7 (seven) days.   Yes Historical Provider, MD   Physical Exam: Filed Vitals:   01/21/13 1630  BP: 200/90  Pulse: 117  Temp:   Resp: 16    BP 200/90  Pulse 117  Temp(Src) 100.2 F (37.9 C) (Rectal)  Resp 16  SpO2 96%  General:  Appears calm and comfortable Eyes: PERRL, normal lids, irises & conjunctiva ENT: grossly normal hearing, lips & tongue dry.  Neck: no LAD, masses or thyromegaly Cardiovascular: Tachycardia RRR, no m/r/g. No LE edema. Telemetry: SR, tachycardia Respiratory: CTA bilaterally, no w/r/r. Normal respiratory effort. Abdomen: soft, tenderness right side mid abdomen. No rigidity, PCA pump in place.  Skin: no rash or induration seen on limited exam Musculoskeletal: grossly normal tone BUE/BLE Psychiatric: grossly normal mood and affect, speech fluent and appropriate Neurologic: grossly non-focal.          Labs on Admission:  Basic Metabolic Panel:  Recent Labs Lab 01/21/13 1011  NA 138  K 3.4*  CL 97  CO2 29  GLUCOSE 173*  BUN 12  CREATININE 0.61  CALCIUM 8.6   Liver Function  Tests:  Recent Labs Lab 01/21/13 1011  AST 23  ALT 13  ALKPHOS 90  BILITOT 0.1*  PROT 6.8  ALBUMIN 3.8    Recent Labs Lab 01/21/13 1011  LIPASE 11   No results found for this basename: AMMONIA,  in the last 168 hours CBC:  Recent Labs Lab 01/21/13 1011  WBC 6.2  NEUTROABS 5.3  HGB 14.1  HCT 42.1  MCV 94.6  PLT 131*   Cardiac Enzymes:  Recent Labs Lab 01/21/13 1011  TROPONINI <0.30    BNP (last 3 results)  Recent Labs  01/21/13 1011  PROBNP 911.1*   CBG: No results found for this basename: GLUCAP,  in the last 168 hours  Radiological Exams on Admission: Dg Chest  2 View  01/21/2013   CLINICAL DATA:  78 year old female with shortness of breath and weakness. Initial encounter.  EXAM: CHEST  2 VIEW  COMPARISON:  11/10/2011 and earlier.  FINDINGS: Stable lung volumes. Mild costophrenic angle blunting bilaterally not significantly changed since 2013. No pneumothorax, pulmonary edema or confluent pulmonary opacity. Chronic thoracic spinal stimulator and scoliosis. Normal cardiac size and mediastinal contours. Visualized tracheal air column is within normal limits. Calcified aortic atherosclerosis. No acute osseous abnormality identified.  IMPRESSION: Chronic trace pleural effusions versus pleural scarring. No acute cardiopulmonary abnormality.   Electronically Signed   By: Augusto Gamble M.D.   On: 01/21/2013 12:54   Ct Abdomen Pelvis W Contrast  01/21/2013   CLINICAL DATA:  Epigastric pain.  Right upper quadrant pain.  EXAM: CT ABDOMEN AND PELVIS WITH CONTRAST  TECHNIQUE: Multidetector CT imaging of the abdomen and pelvis was performed using the standard protocol following bolus administration of intravenous contrast.  CONTRAST:  80mL OMNIPAQUE IOHEXOL 300 MG/ML  SOLN  COMPARISON:  10/12/2011.  FINDINGS: Lung Bases: Dependent atelectasis.  Liver: Unchanged mild intrahepatic biliary ductal dilation status post cholecystectomy. Small amount of pneumobilia in the left hepatic  lobe compatible with prior sphincterotomy.  Spleen:  Normal.  Gallbladder:  Surgically absent.  Clips in the fossa.  Common bile duct:  Within normal limits status post cholecystectomy.  Pancreas:  Expected pancreatic atrophy.  Adrenal glands:  Normal bilaterally.  Kidneys: Normal enhancement. Tiny low-density lesions are present likely representing simple renal cysts. Normal delayed excretion of contrast. Ureters are within normal limits.  Stomach:  Normal.  Small bowel: Uncomplicated periampullary duodenum diverticulum. No small bowel obstruction or inflammatory changes.  Colon: Diffuse colitis is present. Mural thickening extends from the cecum to the rectum. Perirectal fat stranding is present. There is colonic diverticulosis present as well. No abscess or perforation is identified.  Pelvic Genitourinary: Hysterectomy. Urinary bladder appears normal. No pelvic free fluid.  Bones: Scoliosis and spondylosis. No acute osseous abnormality. No aggressive osseous lesions.  Vasculature: Atherosclerosis.  Tortuous abdominal aorta.  Body Wall: There is an implantable device in the right abdomen, possibly representing an insulin pump. Spinal stimulator is also present.  IMPRESSION: 1. Recurrent pain and colitis. The differential considerations are primarily infection including C difficile or ulcerative colitis/inflammatory bowel disease. 2. Hysterectomy and cholecystectomy with pneumobilia compatible with sphincterotomy.   Electronically Signed   By: Andreas Newport M.D.   On: 01/21/2013 15:19    EKG: Independently reviewed. Sinus rhythm.   Assessment/Plan Principal Problem:   Pancolitis Active Problems:   HYPERTENSION   Chronic tension headaches   Sinus tachycardia  1-Pan-colitis: Differential: infectious, inflamatory , ischemic.  patient presents with abdominal pain, nausea, vomiting. CT abdomen show pan colitis. Admit to telemetry, IV fluids, Anti emetics, patient febrile. Will start IV ciprofloxacin and  flagyl. No diarrhea reported. She was occult blood positive. Dr Dulce Sellar will see patient in consultation. Will check lactic acid.   2-Hypertension: PRN Hydralazine.  3-Tachycardia: tylenol for fever, IV fluids.  4-Chronic pain: patient has PCA pump. Hold oral opioid due to vomiting. Will order PRN dilaudid IV careful with oversedation.  5-Hypokalemia: replace with KCL IV 3 runs.  6-Chronic Headaches; PRN pain medications.  7-DVT prophylaxis: SCD, if hb stable need to start Lovenox/heparin for DVT prophylaxis. Patient occult blood positive.   Code Status:Full Code.  Family Communication: Care discussed with daughter.  Disposition Plan: expect 4 to 5 days.   Time spent: 75 minutes.   [name] [name] [facility]  Pager [phone]

## 2013-01-21 NOTE — ED Notes (Signed)
CT transporter sent away by family due to patient's headache.  Pt resting at this time.  Patient c/o headache, contacted Tiffany, PA.  Tiffany reordered dilaudid

## 2013-01-21 NOTE — ED Notes (Signed)
Pt continuing to vomit. Can not keep contrast down

## 2013-01-21 NOTE — ED Provider Notes (Signed)
Complains of multiple symptoms of vomiting and abdominal pain onset last day. Presently without abdominal pain as I examine her. On exam chronically ill-appearing other than normal active bowel sounds, nontender.   Doug Sou, MD 01/21/13 1734

## 2013-01-21 NOTE — Progress Notes (Signed)
Report called in to the ED, report from [place].

## 2013-01-21 NOTE — ED Provider Notes (Signed)
Medical screening examination/treatment/procedure(s) were performed by non-physician practitioner and as supervising physician I was immediately available for consultation/collaboration.  EKG Interpretation    Date/Time:  Friday January 21 2013 09:48:54 EST Ventricular Rate:  91 PR Interval:  155 QRS Duration: 83 QT Interval:  366 QTC Calculation: 450 R Axis:   71 Text Interpretation:  Sinus rhythm Baseline wander in lead(s) V6 No significant change since last tracing Confirmed by Ethelda Chick  MD, Trusten Hume ([id number]) on 01/21/2013 12:17:37 PM             Doug Sou, MD 01/21/13 1734

## 2013-01-21 NOTE — ED Notes (Signed)
Patient presents via ems with c/o vomiting several times since night, unsure of amount of times.  Patient states she feels sick, has RUQ tenderness.  Family states that she has hx of probable pancreatitis, patient states that this feels similar

## 2013-01-21 NOTE — ED Provider Notes (Signed)
CSN: [id number]     Arrival date & time 01/21/13  4098 History   First MD Initiated Contact with Patient 01/21/13 0945     Chief Complaint  Patient presents with  . Abdominal Pain   (Consider location/radiation/quality/duration/timing/severity/associated sxs/prior Treatment) HPI  Gastroenterologist: Dr. Ewing Schlein  PCP: Dr. Peggyann Juba Cardiology: Dr. Patty Sermons Pulmonologist: Dr. Delford Field  PMH: 1. GERD 2. COPD 3. CHF, hx 4. Hypertension 5. Pancolitis  6. Pancreatitis 7. Chronic back pain - pt has implanted Dilaudid pump  Patient presents to the ER from home with complaints of vomiting and RUQ pain that started last night, the pain does not radiate, and she is unable to describe it. She has a hx of abdominal pain and has had biliary stents placed and an ERCP in the past for her pains. The family is with her and say that the patient appears to not feel well and has vomited numerous times since last night. She endorses feeling weak. Pt denies constipation or diarrhea. Normal bowel movements with out blood in vomitus or stool. Pt denies SOB, CP, wheezing, weakness, dysuria.    Past Medical History  Diagnosis Date  . Osteoporosis   . Arthritis     osteoarthritis  . GERD (gastroesophageal reflux disease)   . Hiatal hernia   . COPD (chronic obstructive pulmonary disease)   . PUD (peptic ulcer disease)   . Cardiac arrhythmia   . Hyperlipidemia   . Heart failure   . Hypertension   . Scoliosis deformity of spine     history of intractable back pain  . Choledocholithiasis   . Pancolitis     Infectious vs. inflammatory  . Abdominal pain     Resolved  . Chronic anemia   . Chronic back pain   . Pancreatitis     History of  . CHF (congestive heart failure)    Past Surgical History  Procedure Laterality Date  . Cholecystectomy  2008  . Abdominal hysterectomy  1970s  . Spine surgery  [phone]  . Neck surgery  1970s  . Spinal cord stimulator implant    . Ercp w/  sphicterotomy  02/2010  . Back surgery      x 5   Family History  Problem Relation Age of Onset  . Cancer Mother     uterine  . Heart disease Father   . Hypertension Other    History  Substance Use Topics  . Smoking status: Former Smoker -- 1.00 packs/day for 12 years    Types: Cigarettes    Quit date: 01/28/1988  . Smokeless tobacco: Never Used  . Alcohol Use: No   OB History   Grav Para Term Preterm Abortions TAB SAB Ect Mult Living                 Review of Systems   The patient denies anorexia, fever, weight loss,, vision loss, decreased hearing, hoarseness, chest pain, syncope, dyspnea on exertion, peripheral edema, balance deficits, hemoptysis, melena, hematochezia, severe indigestion/heartburn, hematuria, incontinence, genital sores, muscle weakness, suspicious skin lesions, transient blindness, difficulty walking, depression, unusual weight change, abnormal bleeding, enlarged lymph nodes, angioedema, and breast masses.  Allergies  Codeine; Morphine; Sulfa antibiotics; and Zoledronic acid  Home Medications   Current Outpatient Rx  Name  Route  Sig  Dispense  Refill  . albuterol (PROVENTIL HFA;VENTOLIN HFA) 108 (90 BASE) MCG/ACT inhaler   Inhalation   Inhale 1-2 puffs into the lungs 3 (three) times daily as needed for wheezing or shortness of breath.         [name]  Ascorbic Acid (VITAMIN C) 500 MG tablet   Oral   Take 500 mg by mouth daily.           . budesonide-formoterol (SYMBICORT) 160-4.5 MCG/ACT inhaler   Inhalation   Inhale 2 puffs into the lungs 2 (two) times daily.   1 Inhaler   11   . butalbital-acetaminophen-caffeine (FIORICET, ESGIC) 50-325-40 MG per tablet   Oral   Take 1 tablet by mouth every 8 (eight) hours as needed for headache.         . Calcium Carbonate-Vitamin D (CALCIUM 600-D) 600-400 MG-UNIT per tablet   Oral   Take 1 tablet by mouth 2 (two) times daily with a meal.           . cyclobenzaprine (FLEXERIL) 10 MG tablet   Oral    Take 1 tablet by mouth 3 (three) times daily as needed for muscle spasms.          . diclofenac sodium (VOLTAREN) 1 % GEL   Topical   Apply 1 application topically 4 (four) times daily as needed. Inflammation   100 g   2   . ferrous fumarate-iron polysaccharide complex (TANDEM) 162-115.2 MG CAPS   Oral   Take 1 capsule by mouth daily.           . fluticasone (FLONASE) 50 MCG/ACT nasal spray   Each Nare   Place 2 sprays into both nostrils daily.          [name] gabapentin (NEURONTIN) 100 MG capsule   Oral   Take 100 mg by mouth 2 (two) times daily.          [name] HYDROmorphone (DILAUDID) 4 MG tablet   Oral   Take 4 mg by mouth 2 (two) times daily as needed for moderate pain or severe pain.         . hyoscyamine (ANASPAZ) 0.125 MG TBDP   Sublingual   Place 0.125 mg under the tongue daily.         . Lactulose SOLN   Oral   Take 2 mLs by mouth at bedtime as needed (for constipation).          . nitroGLYCERIN (NITROSTAT) 0.4 MG SL tablet   Sublingual   Place 1 tablet (0.4 mg total) under the tongue every 5 (five) minutes as needed for chest pain.   100 tablet   3   . omeprazole (PRILOSEC) 40 MG capsule   Oral   Take 40 mg by mouth 2 (two) times daily.         [name] oxyCODONE-acetaminophen (PERCOCET/ROXICET) 5-325 MG per tablet   Oral   Take 1 tablet by mouth every 4 (four) hours as needed for moderate pain or severe pain.         . promethazine (PHENERGAN) 25 MG tablet   Oral   Take 1 tablet (25 mg total) by mouth as needed. Nausea   30 tablet   0   . Vitamin D, Ergocalciferol, (DRISDOL) 50000 UNITS CAPS   Oral   Take 50,000 Units by mouth every 7 (seven) days.          BP 191/82  Pulse 101  Temp(Src) 100.2 F (37.9 C) (Rectal)  Resp 19  SpO2 98% Physical Exam  Nursing note and vitals reviewed. Constitutional: She appears well-developed and well-nourished. No distress.  HENT:  Head: Normocephalic and atraumatic.  Eyes: Pupils are equal, round, and  reactive to light.  Neck: Normal range of motion. Neck supple.  Cardiovascular: Normal rate and regular rhythm.   Pulmonary/Chest: Effort normal.  Abdominal: Soft. There is tenderness in the right upper quadrant. There is no rigidity, no rebound, no guarding, no CVA tenderness, no tenderness at McBurney's point and negative Murphy's sign.    Vomiting during exam  Neurological: She is alert.  Skin: Skin is warm and dry.    ED Course  Procedures (including critical care time) Labs Review Labs Reviewed  CBC WITH DIFFERENTIAL - Abnormal; Notable for the following:    Platelets 131 (*)    Neutrophils Relative % 87 (*)    Lymphocytes Relative 3 (*)    Lymphs Abs 0.2 (*)    All other components within normal limits  COMPREHENSIVE METABOLIC PANEL - Abnormal; Notable for the following:    Potassium 3.4 (*)    Glucose, Bld 173 (*)    Total Bilirubin 0.1 (*)    GFR calc non Af Amer 80 (*)    All other components within normal limits  PRO B NATRIURETIC PEPTIDE - Abnormal; Notable for the following:    Pro B Natriuretic peptide (BNP) 911.1 (*)    All other components within normal limits  URINALYSIS, ROUTINE W REFLEX MICROSCOPIC - Abnormal; Notable for the following:    Hgb urine dipstick SMALL (*)    Ketones, ur 15 (*)    All other components within normal limits  OCCULT BLOOD, POC DEVICE - Abnormal; Notable for the following:    Fecal Occult Bld POSITIVE (*)    All other components within normal limits  CLOSTRIDIUM DIFFICILE BY PCR  LIPASE, BLOOD  TROPONIN I  URINE MICROSCOPIC-ADD ON  OCCULT BLOOD X 1 CARD TO LAB, STOOL   Imaging Review Dg Chest 2 View  01/21/2013   CLINICAL DATA:  79 year old female with shortness of breath and weakness. Initial encounter.  EXAM: CHEST  2 VIEW  COMPARISON:  11/10/2011 and earlier.  FINDINGS: Stable lung volumes. Mild costophrenic angle blunting bilaterally not significantly changed since 2013. No pneumothorax, pulmonary edema or confluent  pulmonary opacity. Chronic thoracic spinal stimulator and scoliosis. Normal cardiac size and mediastinal contours. Visualized tracheal air column is within normal limits. Calcified aortic atherosclerosis. No acute osseous abnormality identified.  IMPRESSION: Chronic trace pleural effusions versus pleural scarring. No acute cardiopulmonary abnormality.   Electronically Signed   By: Augusto Gamble M.D.   On: 01/21/2013 12:54   Ct Abdomen Pelvis W Contrast  01/21/2013   CLINICAL DATA:  Epigastric pain.  Right upper quadrant pain.  EXAM: CT ABDOMEN AND PELVIS WITH CONTRAST  TECHNIQUE: Multidetector CT imaging of the abdomen and pelvis was performed using the standard protocol following bolus administration of intravenous contrast.  CONTRAST:  80mL OMNIPAQUE IOHEXOL 300 MG/ML  SOLN  COMPARISON:  10/12/2011.  FINDINGS: Lung Bases: Dependent atelectasis.  Liver: Unchanged mild intrahepatic biliary ductal dilation status post cholecystectomy. Small amount of pneumobilia in the left hepatic lobe compatible with prior sphincterotomy.  Spleen:  Normal.  Gallbladder:  Surgically absent.  Clips in the fossa.  Common bile duct:  Within normal limits status post cholecystectomy.  Pancreas:  Expected pancreatic atrophy.  Adrenal glands:  Normal bilaterally.  Kidneys: Normal enhancement. Tiny low-density lesions are present likely representing simple renal cysts. Normal delayed excretion of contrast. Ureters are within normal limits.  Stomach:  Normal.  Small bowel: Uncomplicated periampullary duodenum diverticulum. No small bowel obstruction or inflammatory changes.  Colon: Diffuse colitis is present. Mural thickening extends from the cecum to the rectum. Perirectal fat stranding is  present. There is colonic diverticulosis present as well. No abscess or perforation is identified.  Pelvic Genitourinary: Hysterectomy. Urinary bladder appears normal. No pelvic free fluid.  Bones: Scoliosis and spondylosis. No acute osseous abnormality.  No aggressive osseous lesions.  Vasculature: Atherosclerosis.  Tortuous abdominal aorta.  Body Wall: There is an implantable device in the right abdomen, possibly representing an insulin pump. Spinal stimulator is also present.  IMPRESSION: 1. Recurrent pain and colitis. The differential considerations are primarily infection including C difficile or ulcerative colitis/inflammatory bowel disease. 2. Hysterectomy and cholecystectomy with pneumobilia compatible with sphincterotomy.   Electronically Signed   By: Andreas Newport M.D.   On: 01/21/2013 15:19    EKG Interpretation    Date/Time:  Friday January 21 2013 09:48:54 EST Ventricular Rate:  91 PR Interval:  155 QRS Duration: 83 QT Interval:  366 QTC Calculation: 450 R Axis:   71 Text Interpretation:  Sinus rhythm Baseline wander in lead(s) V6 No significant change since last tracing Confirmed by JACUBOWITZ  MD, SAM ([id number]) on 01/21/2013 12:17:37 PM            MDM   1. Pancolitis   2. Hypertension    Patients pain and n/v difficult to control in the ED. Patients blood pressure is also difficult to control- 5mg  IV Lopressor ordered. CT abd/pelv came back and shows more pan colitis. I spoke with Dr. Dulce Sellar who works with Dr. Ewing Schlein, He will consult on patient in the hospital. He did not make a request for me to order abx.  [facility] has agreed to admit, inpatient, team 10, [facility], tele    Dorthula Matas, PA-C 01/21/13 1614  Dorthula Matas, PA-C 01/21/13 1614

## 2013-01-22 DIAGNOSIS — R1013 Epigastric pain: Secondary | ICD-10-CM

## 2013-01-22 DIAGNOSIS — R112 Nausea with vomiting, unspecified: Secondary | ICD-10-CM

## 2013-01-22 DIAGNOSIS — G8929 Other chronic pain: Secondary | ICD-10-CM

## 2013-01-22 LAB — COMPREHENSIVE METABOLIC PANEL
ALT: 10 U/L (ref 0–35)
AST: 18 U/L (ref 0–37)
Albumin: 2.7 g/dL — ABNORMAL LOW (ref 3.5–5.2)
Alkaline Phosphatase: 74 U/L (ref 39–117)
BUN: 15 mg/dL (ref 6–23)
CO2: 31 mEq/L (ref 19–32)
Calcium: 7.4 mg/dL — ABNORMAL LOW (ref 8.4–10.5)
Chloride: 100 mEq/L (ref 96–112)
Creatinine, Ser: 0.74 mg/dL (ref 0.50–1.10)
GFR calc Af Amer: 87 mL/min — ABNORMAL LOW (ref >90)
GFR calc non Af Amer: 75 mL/min — ABNORMAL LOW (ref >90)
Glucose, Bld: 111 mg/dL — ABNORMAL HIGH (ref 70–99)
Potassium: 4.8 mEq/L (ref 3.5–5.1)
Sodium: 137 mEq/L (ref 135–145)
Total Bilirubin: 0.1 mg/dL — ABNORMAL LOW (ref 0.3–1.2)
Total Protein: 5.5 g/dL — ABNORMAL LOW (ref 6.0–8.3)

## 2013-01-22 LAB — CBC
HCT: 46.1 % — ABNORMAL HIGH (ref 36.0–46.0)
Hemoglobin: 15.4 g/dL — ABNORMAL HIGH (ref 12.0–15.0)
MCH: 32 pg (ref 26.0–34.0)
MCHC: 33.4 g/dL (ref 30.0–36.0)
MCV: 95.6 fL (ref 78.0–100.0)
Platelets: 147 10*3/uL — ABNORMAL LOW (ref 150–400)
RBC: 4.82 MIL/uL (ref 3.87–5.11)
RDW: 14 % (ref 11.5–15.5)
WBC: 5.7 10*3/uL (ref 4.0–10.5)

## 2013-01-22 LAB — GLUCOSE, CAPILLARY: Glucose-Capillary: 90 mg/dL (ref 70–99)

## 2013-01-22 MED ORDER — ONDANSETRON HCL 4 MG/2ML IJ SOLN
4.0000 mg | Freq: Four times a day (QID) | INTRAMUSCULAR | Status: DC | PRN
  Administered 2013-01-22 – 2013-01-24 (×4): 4 mg via INTRAVENOUS
  Filled 2013-01-22 (×4): qty 2

## 2013-01-22 MED ORDER — ONDANSETRON HCL 4 MG/2ML IJ SOLN
INTRAMUSCULAR | Status: AC
  Administered 2013-01-22: 11:00:00
  Filled 2013-01-22: qty 2

## 2013-01-22 NOTE — Progress Notes (Signed)
[facility] PROGRESS NOTE  [name] [name] ZOX:[id number] DOB: 10-Aug-1925 DOA: 01/21/2013 PCP: Lemont Fillers., NP  Assessment/Plan: 1-Pan-colitis: Most likely infectious versus inflammatory  -Will continue ciprofloxacin and Flagyl -Continue bowel rest with once of clear liquid diet if patient  patient feels she can tolerate it -GI has been consulted and will determine needs for flex/colonoscopy -follow up GI pathogen and c. Diff -No diarrhea reported.  -She was occult blood positive.  -continue PRN antiemetics  2-Hypertension: PRN Hydralazine.   3-Tachycardia: Tylenol for fever, IV fluids.   4-Chronic pain: patient has PCA pump.  -continue PRN dilaudid  5-Hypokalemia: repleted  6-Chronic Headaches; PRN pain medications.   7-DVT prophylaxis: SCD   Code Status: Full Family Communication: Daughter at bedside Disposition Plan: Home when medically stable   Consultants:  GI (Dr. Madilyn Fireman)  Procedures:  See below for x-ray reports  Antibiotics:  Ciprofloxacin and Flagyl  HPI/Subjective: Afebrile, complaining of nausea and some episodes of vomiting; patient also describes intermittent abdominal pain and one time episode of vaginal bleeding.  Objective: Filed Vitals:   01/22/13 0428  BP: 137/77  Pulse: 104  Temp: 98.6 F (37 C)  Resp: 21    Intake/Output Summary (Last 24 hours) at 01/22/13 1117 Last data filed at 01/22/13 0446  Gross per 24 hour  Intake      0 ml  Output    350 ml  Net   -350 ml   Filed Weights   01/21/13 1751 01/22/13 0500  Weight: 51.755 kg (114 lb 1.6 oz) 54.1 kg (119 lb 4.3 oz)    Exam:   General:  NAD, complaining of abd pain and N/V  Cardiovascular: tachycardic, no rubs or gallops, S1 and S2  Respiratory: CTA bilaterally  Abdomen: dilaudid pump appreciated on exam (right mid region); tenderness to palpation of her LLQ, no distension, soft and with positive BS  Musculoskeletal: No edema, no cyanosis.  Data  Reviewed: Basic Metabolic Panel:  Recent Labs Lab 01/21/13 1011 01/22/13 0425  NA 138 137  K 3.4* 4.8  CL 97 100  CO2 29 31  GLUCOSE 173* 111*  BUN 12 15  CREATININE 0.61 0.74  CALCIUM 8.6 7.4*   Liver Function Tests:  Recent Labs Lab 01/21/13 1011 01/22/13 0425  AST 23 18  ALT 13 10  ALKPHOS 90 74  BILITOT 0.1* 0.1*  PROT 6.8 5.5*  ALBUMIN 3.8 2.7*    Recent Labs Lab 01/21/13 1011  LIPASE 11   CBC:  Recent Labs Lab 01/21/13 1011 01/22/13 0425  WBC 6.2 5.7  NEUTROABS 5.3  --   HGB 14.1 15.4*  HCT 42.1 46.1*  MCV 94.6 95.6  PLT 131* 147*   Cardiac Enzymes:  Recent Labs Lab 01/21/13 1011  TROPONINI <0.30   BNP (last 3 results)  Recent Labs  01/21/13 1011  PROBNP 911.1*   CBG:  Recent Labs Lab 01/22/13 1035  GLUCAP 90    Studies: Dg Chest 2 View  01/21/2013   CLINICAL DATA:  79 year old female with shortness of breath and weakness. Initial encounter.  EXAM: CHEST  2 VIEW  COMPARISON:  11/10/2011 and earlier.  FINDINGS: Stable lung volumes. Mild costophrenic angle blunting bilaterally not significantly changed since 2013. No pneumothorax, pulmonary edema or confluent pulmonary opacity. Chronic thoracic spinal stimulator and scoliosis. Normal cardiac size and mediastinal contours. Visualized tracheal air column is within normal limits. Calcified aortic atherosclerosis. No acute osseous abnormality identified.  IMPRESSION: Chronic trace pleural effusions versus pleural scarring. No acute cardiopulmonary abnormality.  Electronically Signed   By: Augusto Gamble M.D.   On: 01/21/2013 12:54   Ct Head Wo Contrast  01/21/2013   CLINICAL DATA:  Headaches with history of hypertension  EXAM: CT HEAD WITHOUT CONTRAST  TECHNIQUE: Contiguous axial images were obtained from the base of the skull through the vertex without intravenous contrast.  COMPARISON:  CT scan of the brain dated October 12, 2011.  FINDINGS: The ventricles are normal in size and position.  There is mild diffuse cerebral atrophy which is stable and consistent with chronic small vessel ischemia. There is no shift of the midline. There is no evidence of an acute intracranial hemorrhage. No acute ischemic event is demonstrated. There are punctate basal ganglia calcifications bilaterally. The cerebellum and brainstem exhibit no acute abnormalities.  At bone window settings the observed portions of the paranasal sinuses and mastoid air cells are clear. There is no evidence of an acute skull fracture.  IMPRESSION: 1. There is no evidence of an acute ischemic or hemorrhagic event. There is no intracranial mass effect or hydrocephalus. 2. There are findings consistent with chronic small vessel ischemia and mild diffuse age appropriate cerebral atrophy.   Electronically Signed   By: David  [place]   On: 01/21/2013 20:20   Ct Abdomen Pelvis W Contrast  01/21/2013   CLINICAL DATA:  Epigastric pain.  Right upper quadrant pain.  EXAM: CT ABDOMEN AND PELVIS WITH CONTRAST  TECHNIQUE: Multidetector CT imaging of the abdomen and pelvis was performed using the standard protocol following bolus administration of intravenous contrast.  CONTRAST:  80mL OMNIPAQUE IOHEXOL 300 MG/ML  SOLN  COMPARISON:  10/12/2011.  FINDINGS: Lung Bases: Dependent atelectasis.  Liver: Unchanged mild intrahepatic biliary ductal dilation status post cholecystectomy. Small amount of pneumobilia in the left hepatic lobe compatible with prior sphincterotomy.  Spleen:  Normal.  Gallbladder:  Surgically absent.  Clips in the fossa.  Common bile duct:  Within normal limits status post cholecystectomy.  Pancreas:  Expected pancreatic atrophy.  Adrenal glands:  Normal bilaterally.  Kidneys: Normal enhancement. Tiny low-density lesions are present likely representing simple renal cysts. Normal delayed excretion of contrast. Ureters are within normal limits.  Stomach:  Normal.  Small bowel: Uncomplicated periampullary duodenum diverticulum. No small  bowel obstruction or inflammatory changes.  Colon: Diffuse colitis is present. Mural thickening extends from the cecum to the rectum. Perirectal fat stranding is present. There is colonic diverticulosis present as well. No abscess or perforation is identified.  Pelvic Genitourinary: Hysterectomy. Urinary bladder appears normal. No pelvic free fluid.  Bones: Scoliosis and spondylosis. No acute osseous abnormality. No aggressive osseous lesions.  Vasculature: Atherosclerosis.  Tortuous abdominal aorta.  Body Wall: There is an implantable device in the right abdomen, possibly representing an insulin pump. Spinal stimulator is also present.  IMPRESSION: 1. Recurrent pain and colitis. The differential considerations are primarily infection including C difficile or ulcerative colitis/inflammatory bowel disease. 2. Hysterectomy and cholecystectomy with pneumobilia compatible with sphincterotomy.   Electronically Signed   By: Andreas Newport M.D.   On: 01/21/2013 15:19    Scheduled Meds: . budesonide-formoterol  2 puff Inhalation BID  . ciprofloxacin  400 mg Intravenous Q12H  . fluticasone  2 spray Each Nare Daily  . gabapentin  100 mg Oral BID  . metoCLOPramide (REGLAN) injection  5 mg Intravenous Once  . metronidazole  500 mg Intravenous Q8H  . pantoprazole (PROTONIX) IV  40 mg Intravenous Q12H  . sodium chloride  3 mL Intravenous Q12H   Continuous Infusions: .  sodium chloride 125 mL/hr at 01/22/13 0446     Time spent: >30 minutes    Haydn Cush  [facility] Pager [phone]. If 7PM-7AM, please contact night-coverage at [email or web address], password [facility] 01/22/2013, 11:17 AM  LOS: 1 day

## 2013-01-22 NOTE — Progress Notes (Signed)
Pt tried to eat clear liquids this afternoon and became sick with N&V immediately after having a few sips.  MD was notified.  Pt was instructed to hold off drinking anything else for the time being.

## 2013-01-22 NOTE — Consult Note (Signed)
[facility] Consult Note  Referring Provider: No ref. provider found Primary Care Physician:  Lemont Fillers., NP Primary Gastroenterologist:  Dr.  Antony Contras Complaint: nausea and vomiting HPI: [name] [name] is an 79 y.o. white  female  Who presents having been found by her daughters with nausea vomiting and apparently abdominal pain. She was brought to the emergency room. Evaluation there by CT scan revealed pan colitis with mural thickening from the rectum to the cecum. Interestingly she denies any diarrhea or bloody bowel movements. She is apparently had similar presentation in the past in 2013 and possibly prior to that as well. She denies any recent travel or recent antibiotics. C. Difficile toxin is pending. She has no personal or family history of inflammatory bowel disease.  Past Medical History  Diagnosis Date  . Osteoporosis   . Arthritis     osteoarthritis  . GERD (gastroesophageal reflux disease)   . Hiatal hernia   . PUD (peptic ulcer disease)   . Cardiac arrhythmia   . Hyperlipidemia   . Heart failure   . Hypertension   . Scoliosis deformity of spine     history of intractable back pain  . Choledocholithiasis   . Pancolitis     Infectious vs. inflammatory  . Abdominal pain     Resolved  . Chronic anemia   . Chronic back pain   . Pancreatitis     History of  . CHF (congestive heart failure)   . COPD (chronic obstructive pulmonary disease)     oxygen dependent at  night 2LPM    Past Surgical History  Procedure Laterality Date  . Cholecystectomy  2008  . Abdominal hysterectomy  1970s  . Spine surgery  [phone]  . Neck surgery  1970s  . Spinal cord stimulator implant    . Ercp w/ sphicterotomy  02/2010  . Back surgery      x 5  . Pain pump implantation  2009    back, dilaudid and bupravacaine    Medications Prior to Admission  Medication Sig Dispense Refill  . albuterol (PROVENTIL HFA;VENTOLIN HFA) 108 (90 BASE) MCG/ACT inhaler  Inhale 1-2 puffs into the lungs 3 (three) times daily as needed for wheezing or shortness of breath.      . Ascorbic Acid (VITAMIN C) 500 MG tablet Take 500 mg by mouth daily.        . budesonide-formoterol (SYMBICORT) 160-4.5 MCG/ACT inhaler Inhale 2 puffs into the lungs 2 (two) times daily.  1 Inhaler  11  . butalbital-acetaminophen-caffeine (FIORICET, ESGIC) 50-325-40 MG per tablet Take 1 tablet by mouth every 8 (eight) hours as needed for headache.      . Calcium Carbonate-Vitamin D (CALCIUM 600-D) 600-400 MG-UNIT per tablet Take 1 tablet by mouth 2 (two) times daily with a meal.        . cyclobenzaprine (FLEXERIL) 10 MG tablet Take 1 tablet by mouth 3 (three) times daily as needed for muscle spasms.       . diclofenac sodium (VOLTAREN) 1 % GEL Apply 1 application topically 4 (four) times daily as needed. Inflammation  100 g  2  . ferrous fumarate-iron polysaccharide complex (TANDEM) 162-115.2 MG CAPS Take 1 capsule by mouth daily.        . fluticasone (FLONASE) 50 MCG/ACT nasal spray Place 2 sprays into both nostrils daily.       [name] gabapentin (NEURONTIN) 100 MG capsule Take 100 mg by mouth 2 (two) times daily.       [name]  HYDROmorphone (DILAUDID) 4 MG tablet Take 4 mg by mouth 2 (two) times daily as needed for moderate pain or severe pain.      . hyoscyamine (ANASPAZ) 0.125 MG TBDP Place 0.125 mg under the tongue daily.      . Lactulose SOLN Take 2 mLs by mouth at bedtime as needed (for constipation).       . nitroGLYCERIN (NITROSTAT) 0.4 MG SL tablet Place 1 tablet (0.4 mg total) under the tongue every 5 (five) minutes as needed for chest pain.  100 tablet  3  . omeprazole (PRILOSEC) 40 MG capsule Take 40 mg by mouth 2 (two) times daily.      [name] oxyCODONE-acetaminophen (PERCOCET/ROXICET) 5-325 MG per tablet Take 1 tablet by mouth every 4 (four) hours as needed for moderate pain or severe pain.      . promethazine (PHENERGAN) 25 MG tablet Take 1 tablet (25 mg total) by mouth as needed. Nausea  30  tablet  0  . Vitamin D, Ergocalciferol, (DRISDOL) 50000 UNITS CAPS Take 50,000 Units by mouth every 7 (seven) days.        Allergies:  Allergies  Allergen Reactions  . Codeine     REACTION: n/v/d, HA  . Morphine     REACTION: n/v/d, HA  . Sulfa Antibiotics   . Zoledronic Acid     REACTION: Severe edema    Family History  Problem Relation Age of Onset  . Cancer Mother     uterine  . Heart disease Father   . Hypertension Other     Social History:  reports that she quit smoking about 25 years ago. Her smoking use included Cigarettes. She has a 12 pack-year smoking history. She has never used smokeless tobacco. She reports that she does not drink alcohol or use illicit drugs.  Review of Systems: negative except as above   Blood pressure 137/77, pulse 104, temperature 98.6 F (37 C), temperature source Oral, resp. rate 21, weight 54.1 kg (119 lb 4.3 oz), SpO2 98.00%. Head: Normocephalic, without obvious abnormality, atraumatic Neck: no adenopathy, no carotid bruit, no JVD, supple, symmetrical, trachea midline and thyroid not enlarged, symmetric, no tenderness/mass/nodules Resp: clear to auscultation bilaterally Cardio: regular rate and rhythm, S1, S2 normal, no murmur, click, rub or gallop GI: abdomen soft nondistended with normoactive bowel sounds. There is a subcutaneous pain pump devices in the right mid abdomen is easily palpable. Extremities: extremities normal, atraumatic, no cyanosis or edema  Results for orders placed during the hospital encounter of 01/21/13 (from the past 48 hour(s))  CBC WITH DIFFERENTIAL     Status: Abnormal   Collection Time    01/21/13 10:11 AM      Result Value Range   WBC 6.2  4.0 - 10.5 K/uL   RBC 4.45  3.87 - 5.11 MIL/uL   Hemoglobin 14.1  12.0 - 15.0 g/dL   HCT 78.2  95.6 - 21.3 %   MCV 94.6  78.0 - 100.0 fL   MCH 31.7  26.0 - 34.0 pg   MCHC 33.5  30.0 - 36.0 g/dL   RDW 08.6  57.8 - 46.9 %   Platelets 131 (*) 150 - 400 K/uL    Neutrophils Relative % 87 (*) 43 - 77 %   Neutro Abs 5.3  1.7 - 7.7 K/uL   Lymphocytes Relative 3 (*) 12 - 46 %   Lymphs Abs 0.2 (*) 0.7 - 4.0 K/uL   Monocytes Relative 10  3 - 12 %  Monocytes Absolute 0.6  0.1 - 1.0 K/uL   Eosinophils Relative 0  0 - 5 %   Eosinophils Absolute 0.0  0.0 - 0.7 K/uL   Basophils Relative 0  0 - 1 %   Basophils Absolute 0.0  0.0 - 0.1 K/uL  COMPREHENSIVE METABOLIC PANEL     Status: Abnormal   Collection Time    01/21/13 10:11 AM      Result Value Range   Sodium 138  135 - 145 mEq/L   Potassium 3.4 (*) 3.5 - 5.1 mEq/L   Chloride 97  96 - 112 mEq/L   CO2 29  19 - 32 mEq/L   Glucose, Bld 173 (*) 70 - 99 mg/dL   BUN 12  6 - 23 mg/dL   Creatinine, Ser 4.09  0.50 - 1.10 mg/dL   Calcium 8.6  8.4 - 81.1 mg/dL   Total Protein 6.8  6.0 - 8.3 g/dL   Albumin 3.8  3.5 - 5.2 g/dL   AST 23  0 - 37 U/L   ALT 13  0 - 35 U/L   Alkaline Phosphatase 90  39 - 117 U/L   Total Bilirubin 0.1 (*) 0.3 - 1.2 mg/dL   GFR calc non Af Amer 80 (*) >90 mL/min   GFR calc Af Amer >90  >90 mL/min   Comment: (NOTE)     The eGFR has been calculated using the CKD EPI equation.     This calculation has not been validated in all clinical situations.     eGFR's persistently <90 mL/min signify possible Chronic Kidney     Disease.  LIPASE, BLOOD     Status: None   Collection Time    01/21/13 10:11 AM      Result Value Range   Lipase 11  11 - 59 U/L  TROPONIN I     Status: None   Collection Time    01/21/13 10:11 AM      Result Value Range   Troponin I <0.30  <0.30 ng/mL   Comment:            Due to the release kinetics of cTnI,     a negative result within the first hours     of the onset of symptoms does not rule out     myocardial infarction with certainty.     If myocardial infarction is still suspected,     repeat the test at appropriate intervals.  PRO B NATRIURETIC PEPTIDE     Status: Abnormal   Collection Time    01/21/13 10:11 AM      Result Value Range   Pro B  Natriuretic peptide (BNP) 911.1 (*) 0 - 450 pg/mL  URINALYSIS, ROUTINE W REFLEX MICROSCOPIC     Status: Abnormal   Collection Time    01/21/13 11:30 AM      Result Value Range   Color, Urine YELLOW  YELLOW   APPearance CLEAR  CLEAR   Specific Gravity, Urine 1.015  1.005 - 1.030   pH 7.0  5.0 - 8.0   Glucose, UA NEGATIVE  NEGATIVE mg/dL   Hgb urine dipstick SMALL (*) NEGATIVE   Bilirubin Urine NEGATIVE  NEGATIVE   Ketones, ur 15 (*) NEGATIVE mg/dL   Protein, ur NEGATIVE  NEGATIVE mg/dL   Urobilinogen, UA 0.2  0.0 - 1.0 mg/dL   Nitrite NEGATIVE  NEGATIVE   Leukocytes, UA NEGATIVE  NEGATIVE  URINE MICROSCOPIC-ADD ON     Status: None   Collection  Time    01/21/13 11:30 AM      Result Value Range   RBC / HPF 3-6  <3 RBC/hpf  OCCULT BLOOD, POC DEVICE     Status: Abnormal   Collection Time    01/21/13 12:40 PM      Result Value Range   Fecal Occult Bld POSITIVE (*) NEGATIVE  LACTIC ACID, PLASMA     Status: None   Collection Time    01/21/13  6:57 PM      Result Value Range   Lactic Acid, Venous 1.8  0.5 - 2.2 mmol/L  COMPREHENSIVE METABOLIC PANEL     Status: Abnormal   Collection Time    01/22/13  4:25 AM      Result Value Range   Sodium 137  135 - 145 mEq/L   Potassium 4.8  3.5 - 5.1 mEq/L   Comment: DELTA CHECK NOTED   Chloride 100  96 - 112 mEq/L   CO2 31  19 - 32 mEq/L   Glucose, Bld 111 (*) 70 - 99 mg/dL   BUN 15  6 - 23 mg/dL   Creatinine, Ser 4.54  0.50 - 1.10 mg/dL   Calcium 7.4 (*) 8.4 - 10.5 mg/dL   Total Protein 5.5 (*) 6.0 - 8.3 g/dL   Albumin 2.7 (*) 3.5 - 5.2 g/dL   AST 18  0 - 37 U/L   ALT 10  0 - 35 U/L   Alkaline Phosphatase 74  39 - 117 U/L   Total Bilirubin 0.1 (*) 0.3 - 1.2 mg/dL   GFR calc non Af Amer 75 (*) >90 mL/min   GFR calc Af Amer 87 (*) >90 mL/min   Comment: (NOTE)     The eGFR has been calculated using the CKD EPI equation.     This calculation has not been validated in all clinical situations.     eGFR's persistently <90 mL/min signify  possible Chronic Kidney     Disease.  CBC     Status: Abnormal   Collection Time    01/22/13  4:25 AM      Result Value Range   WBC 5.7  4.0 - 10.5 K/uL   RBC 4.82  3.87 - 5.11 MIL/uL   Hemoglobin 15.4 (*) 12.0 - 15.0 g/dL   HCT 09.8 (*) 11.9 - 14.7 %   MCV 95.6  78.0 - 100.0 fL   MCH 32.0  26.0 - 34.0 pg   MCHC 33.4  30.0 - 36.0 g/dL   RDW 82.9  56.2 - 13.0 %   Platelets 147 (*) 150 - 400 K/uL   Dg Chest 2 View  01/21/2013   CLINICAL DATA:  79 year old female with shortness of breath and weakness. Initial encounter.  EXAM: CHEST  2 VIEW  COMPARISON:  11/10/2011 and earlier.  FINDINGS: Stable lung volumes. Mild costophrenic angle blunting bilaterally not significantly changed since 2013. No pneumothorax, pulmonary edema or confluent pulmonary opacity. Chronic thoracic spinal stimulator and scoliosis. Normal cardiac size and mediastinal contours. Visualized tracheal air column is within normal limits. Calcified aortic atherosclerosis. No acute osseous abnormality identified.  IMPRESSION: Chronic trace pleural effusions versus pleural scarring. No acute cardiopulmonary abnormality.   Electronically Signed   By: Augusto Gamble M.D.   On: 01/21/2013 12:54   Ct Head Wo Contrast  01/21/2013   CLINICAL DATA:  Headaches with history of hypertension  EXAM: CT HEAD WITHOUT CONTRAST  TECHNIQUE: Contiguous axial images were obtained from the base of the skull through the  vertex without intravenous contrast.  COMPARISON:  CT scan of the brain dated October 12, 2011.  FINDINGS: The ventricles are normal in size and position. There is mild diffuse cerebral atrophy which is stable and consistent with chronic small vessel ischemia. There is no shift of the midline. There is no evidence of an acute intracranial hemorrhage. No acute ischemic event is demonstrated. There are punctate basal ganglia calcifications bilaterally. The cerebellum and brainstem exhibit no acute abnormalities.  At bone window settings the  observed portions of the paranasal sinuses and mastoid air cells are clear. There is no evidence of an acute skull fracture.  IMPRESSION: 1. There is no evidence of an acute ischemic or hemorrhagic event. There is no intracranial mass effect or hydrocephalus. 2. There are findings consistent with chronic small vessel ischemia and mild diffuse age appropriate cerebral atrophy.   Electronically Signed   By: David  [place]   On: 01/21/2013 20:20   Ct Abdomen Pelvis W Contrast  01/21/2013   CLINICAL DATA:  Epigastric pain.  Right upper quadrant pain.  EXAM: CT ABDOMEN AND PELVIS WITH CONTRAST  TECHNIQUE: Multidetector CT imaging of the abdomen and pelvis was performed using the standard protocol following bolus administration of intravenous contrast.  CONTRAST:  80mL OMNIPAQUE IOHEXOL 300 MG/ML  SOLN  COMPARISON:  10/12/2011.  FINDINGS: Lung Bases: Dependent atelectasis.  Liver: Unchanged mild intrahepatic biliary ductal dilation status post cholecystectomy. Small amount of pneumobilia in the left hepatic lobe compatible with prior sphincterotomy.  Spleen:  Normal.  Gallbladder:  Surgically absent.  Clips in the fossa.  Common bile duct:  Within normal limits status post cholecystectomy.  Pancreas:  Expected pancreatic atrophy.  Adrenal glands:  Normal bilaterally.  Kidneys: Normal enhancement. Tiny low-density lesions are present likely representing simple renal cysts. Normal delayed excretion of contrast. Ureters are within normal limits.  Stomach:  Normal.  Small bowel: Uncomplicated periampullary duodenum diverticulum. No small bowel obstruction or inflammatory changes.  Colon: Diffuse colitis is present. Mural thickening extends from the cecum to the rectum. Perirectal fat stranding is present. There is colonic diverticulosis present as well. No abscess or perforation is identified.  Pelvic Genitourinary: Hysterectomy. Urinary bladder appears normal. No pelvic free fluid.  Bones: Scoliosis and spondylosis. No  acute osseous abnormality. No aggressive osseous lesions.  Vasculature: Atherosclerosis.  Tortuous abdominal aorta.  Body Wall: There is an implantable device in the right abdomen, possibly representing an insulin pump. Spinal stimulator is also present.  IMPRESSION: 1. Recurrent pain and colitis. The differential considerations are primarily infection including C difficile or ulcerative colitis/inflammatory bowel disease. 2. Hysterectomy and cholecystectomy with pneumobilia compatible with sphincterotomy.   Electronically Signed   By: Andreas Newport M.D.   On: 01/21/2013 15:19    Assessment: 1. Presentation primarily of upper GI tract symptoms but with pan colitis seen on CT scan. Apparently she has had this for presentation before. Differential diagnosis includes infectious colitis or inflammatory bowel disease which seems less likely. Ischemia would be less likely. Plan:  Agree with antibiotic selection of Cipro and Flagyl. We'll also await C. Difficile toxin and GI pathogens panel. Depending on findings and her clinical course may consider flexible sigmoidoscopy to sample inflamed mucosa for more accurate diagnosis. We'll follow with you.  Cara Thaxton C 01/22/2013, 8:19 AM

## 2013-01-23 DIAGNOSIS — K219 Gastro-esophageal reflux disease without esophagitis: Secondary | ICD-10-CM

## 2013-01-23 DIAGNOSIS — I509 Heart failure, unspecified: Secondary | ICD-10-CM

## 2013-01-23 MED ORDER — PROMETHAZINE HCL 25 MG/ML IJ SOLN
12.5000 mg | Freq: Once | INTRAMUSCULAR | Status: AC
  Administered 2013-01-23: 12.5 mg via INTRAVENOUS
  Filled 2013-01-23 (×2): qty 1

## 2013-01-23 NOTE — Progress Notes (Signed)
[facility] Progress Note  Subjective: Patient experiencing nausea dry heaving and pain last night, still no stools.  Objective: Vital signs in last 24 hours: Temp:  [98.1 F (36.7 [name])-99.5 F (37.5 [name])] 99.5 F (37.5 [name]) (12/28 0424) Pulse Rate:  [102-114] 114 (12/28 0424) Resp:  [18-20] 18 (12/28 0424) BP: (116-159)/(50-73) 159/73 mmHg (12/28 0424) SpO2:  [99 %-100 %] 100 % (12/28 0424) Weight:  [55.4 kg (122 lb 2.2 oz)] 55.4 kg (122 lb 2.2 oz) (12/28 0425) Weight change: 3.645 kg (8 lb 0.6 oz)   PE: Patient asleep and I did not disturb her,  Lab Results: Results for orders placed during the hospital encounter of 01/21/13 (from the past 24 hour(s))  GLUCOSE, CAPILLARY     Status: None   Collection Time    01/22/13 10:35 AM      Result Value Range   Glucose-Capillary 90  70 - 99 mg/dL    Studies/Results: Dg Chest 2 View  01/21/2013   CLINICAL DATA:  79 year old female with shortness of breath and weakness. Initial encounter.  EXAM: CHEST  2 VIEW  COMPARISON:  11/10/2011 and earlier.  FINDINGS: Stable lung volumes. Mild costophrenic angle blunting bilaterally not significantly changed since 2013. No pneumothorax, pulmonary edema or confluent pulmonary opacity. Chronic thoracic spinal stimulator and scoliosis. Normal cardiac size and mediastinal contours. Visualized tracheal air column is within normal limits. Calcified aortic atherosclerosis. No acute osseous abnormality identified.  IMPRESSION: Chronic trace pleural effusions versus pleural scarring. No acute cardiopulmonary abnormality.   Electronically Signed   By: Augusto Gamble M.D.   On: 01/21/2013 12:54   Ct Head Wo Contrast  01/21/2013   CLINICAL DATA:  Headaches with history of hypertension  EXAM: CT HEAD WITHOUT CONTRAST  TECHNIQUE: Contiguous axial images were obtained from the base of the skull through the vertex without intravenous contrast.  COMPARISON:  CT scan of the brain dated October 12, 2011.  FINDINGS:  The ventricles are normal in size and position. There is mild diffuse cerebral atrophy which is stable and consistent with chronic small vessel ischemia. There is no shift of the midline. There is no evidence of an acute intracranial hemorrhage. No acute ischemic event is demonstrated. There are punctate basal ganglia calcifications bilaterally. The cerebellum and brainstem exhibit no acute abnormalities.  At bone window settings the observed portions of the paranasal sinuses and mastoid air cells are clear. There is no evidence of an acute skull fracture.  IMPRESSION: 1. There is no evidence of an acute ischemic or hemorrhagic event. There is no intracranial mass effect or hydrocephalus. 2. There are findings consistent with chronic small vessel ischemia and mild diffuse age appropriate cerebral atrophy.   Electronically Signed   By: David  [place]   On: 01/21/2013 20:20   Ct Abdomen Pelvis W Contrast  01/21/2013   CLINICAL DATA:  Epigastric pain.  Right upper quadrant pain.  EXAM: CT ABDOMEN AND PELVIS WITH CONTRAST  TECHNIQUE: Multidetector CT imaging of the abdomen and pelvis was performed using the standard protocol following bolus administration of intravenous contrast.  CONTRAST:  80mL OMNIPAQUE IOHEXOL 300 MG/ML  SOLN  COMPARISON:  10/12/2011.  FINDINGS: Lung Bases: Dependent atelectasis.  Liver: Unchanged mild intrahepatic biliary ductal dilation status post cholecystectomy. Small amount of pneumobilia in the left hepatic lobe compatible with prior sphincterotomy.  Spleen:  Normal.  Gallbladder:  Surgically absent.  Clips in the fossa.  Common bile duct:  Within normal limits status post cholecystectomy.  Pancreas:  Expected pancreatic  atrophy.  Adrenal glands:  Normal bilaterally.  Kidneys: Normal enhancement. Tiny low-density lesions are present likely representing simple renal cysts. Normal delayed excretion of contrast. Ureters are within normal limits.  Stomach:  Normal.  Small bowel: Uncomplicated  periampullary duodenum diverticulum. No small bowel obstruction or inflammatory changes.  Colon: Diffuse colitis is present. Mural thickening extends from the cecum to the rectum. Perirectal fat stranding is present. There is colonic diverticulosis present as well. No abscess or perforation is identified.  Pelvic Genitourinary: Hysterectomy. Urinary bladder appears normal. No pelvic free fluid.  Bones: Scoliosis and spondylosis. No acute osseous abnormality. No aggressive osseous lesions.  Vasculature: Atherosclerosis.  Tortuous abdominal aorta.  Body Wall: There is an implantable device in the right abdomen, possibly representing an insulin pump. Spinal stimulator is also present.  IMPRESSION: 1. Recurrent pain and colitis. The differential considerations are primarily infection including [name] difficile or ulcerative colitis/inflammatory bowel disease. 2. Hysterectomy and cholecystectomy with pneumobilia compatible with sphincterotomy.   Electronically Signed   By: Andreas Newport M.D.   On: 01/21/2013 15:19      Assessment: Pancolitis with very atypical symptoms with no stool since admission and primary symptoms of nausea vomiting and abdominal pain. Apparently this or recurrent pattern with her that is never been clearly diagnosed in terms of etiology.  Plan: Continue to await GI pathogens panel and [name]. difficile while continuing empiric antibiotics. However we'll tentatively plan on unprepped flexible sigmoidoscopy with biopsies tomorrow.    [name] [name] 01/23/2013, 9:45 AM

## 2013-01-23 NOTE — Progress Notes (Signed)
[facility] PROGRESS NOTE  [name] [name] ZOX:[id number] DOB: 1925-07-05 DOA: 01/21/2013 PCP: Lemont Fillers., NP  Assessment/Plan: 1-Pan-colitis:Unclear etiology  -Will continue ciprofloxacin and Flagyl with bowel rest -GI has been consulted with plans for flex sig tomorrow -follow up GI pathogen and c. Diff -She was occult blood positive.  -continue PRN antiemetics  2-Hypertension: PRN Hydralazine.   3-Tachycardia: Tylenol for fever, IV fluids.   4-Chronic pain: patient has PCA pump.  -continue PRN dilaudid  5-Hypokalemia: repleted  6-Chronic Headaches; PRN pain medications.   7-DVT prophylaxis: SCD   Code Status: Full Family Communication: Daughter at bedside Disposition Plan: Home when medically stable   Consultants:  GI (Dr. Madilyn Fireman)  Procedures:  See below for x-ray reports  Antibiotics:  Ciprofloxacin and Flagyl  HPI/Subjective: Afebrile, still nauseated, but somewhat improved  Objective: Filed Vitals:   01/23/13 0424  BP: 159/73  Pulse: 114  Temp: 99.5 F (37.5 C)  Resp: 18    Intake/Output Summary (Last 24 hours) at 01/23/13 1257 Last data filed at 01/23/13 0424  Gross per 24 hour  Intake    240 ml  Output   1550 ml  Net  -1310 ml   Filed Weights   01/21/13 1751 01/22/13 0500 01/23/13 0425  Weight: 51.755 kg (114 lb 1.6 oz) 54.1 kg (119 lb 4.3 oz) 55.4 kg (122 lb 2.2 oz)    Exam:   General:  NAD, complaining of abd pain and N/V  Cardiovascular: tachycardic, no rubs or gallops, S1 and S2  Respiratory: CTA bilaterally  Abdomen: dilaudid pump appreciated on exam (right mid region); tenderness to palpation of her LLQ, no distension, soft and with positive BS  Musculoskeletal: No edema, no cyanosis.  Data Reviewed: Basic Metabolic Panel:  Recent Labs Lab 01/21/13 1011 01/22/13 0425  NA 138 137  K 3.4* 4.8  CL 97 100  CO2 29 31  GLUCOSE 173* 111*  BUN 12 15  CREATININE 0.61 0.74  CALCIUM 8.6 7.4*   Liver  Function Tests:  Recent Labs Lab 01/21/13 1011 01/22/13 0425  AST 23 18  ALT 13 10  ALKPHOS 90 74  BILITOT 0.1* 0.1*  PROT 6.8 5.5*  ALBUMIN 3.8 2.7*    Recent Labs Lab 01/21/13 1011  LIPASE 11   CBC:  Recent Labs Lab 01/21/13 1011 01/22/13 0425  WBC 6.2 5.7  NEUTROABS 5.3  --   HGB 14.1 15.4*  HCT 42.1 46.1*  MCV 94.6 95.6  PLT 131* 147*   Cardiac Enzymes:  Recent Labs Lab 01/21/13 1011  TROPONINI <0.30   BNP (last 3 results)  Recent Labs  01/21/13 1011  PROBNP 911.1*   CBG:  Recent Labs Lab 01/22/13 1035  GLUCAP 90    Studies: Ct Head Wo Contrast  01/21/2013   CLINICAL DATA:  Headaches with history of hypertension  EXAM: CT HEAD WITHOUT CONTRAST  TECHNIQUE: Contiguous axial images were obtained from the base of the skull through the vertex without intravenous contrast.  COMPARISON:  CT scan of the brain dated October 12, 2011.  FINDINGS: The ventricles are normal in size and position. There is mild diffuse cerebral atrophy which is stable and consistent with chronic small vessel ischemia. There is no shift of the midline. There is no evidence of an acute intracranial hemorrhage. No acute ischemic event is demonstrated. There are punctate basal ganglia calcifications bilaterally. The cerebellum and brainstem exhibit no acute abnormalities.  At bone window settings the observed portions of the paranasal sinuses and mastoid air cells  are clear. There is no evidence of an acute skull fracture.  IMPRESSION: 1. There is no evidence of an acute ischemic or hemorrhagic event. There is no intracranial mass effect or hydrocephalus. 2. There are findings consistent with chronic small vessel ischemia and mild diffuse age appropriate cerebral atrophy.   Electronically Signed   By: David  [place]   On: 01/21/2013 20:20   Ct Abdomen Pelvis W Contrast  01/21/2013   CLINICAL DATA:  Epigastric pain.  Right upper quadrant pain.  EXAM: CT ABDOMEN AND PELVIS WITH CONTRAST   TECHNIQUE: Multidetector CT imaging of the abdomen and pelvis was performed using the standard protocol following bolus administration of intravenous contrast.  CONTRAST:  80mL OMNIPAQUE IOHEXOL 300 MG/ML  SOLN  COMPARISON:  10/12/2011.  FINDINGS: Lung Bases: Dependent atelectasis.  Liver: Unchanged mild intrahepatic biliary ductal dilation status post cholecystectomy. Small amount of pneumobilia in the left hepatic lobe compatible with prior sphincterotomy.  Spleen:  Normal.  Gallbladder:  Surgically absent.  Clips in the fossa.  Common bile duct:  Within normal limits status post cholecystectomy.  Pancreas:  Expected pancreatic atrophy.  Adrenal glands:  Normal bilaterally.  Kidneys: Normal enhancement. Tiny low-density lesions are present likely representing simple renal cysts. Normal delayed excretion of contrast. Ureters are within normal limits.  Stomach:  Normal.  Small bowel: Uncomplicated periampullary duodenum diverticulum. No small bowel obstruction or inflammatory changes.  Colon: Diffuse colitis is present. Mural thickening extends from the cecum to the rectum. Perirectal fat stranding is present. There is colonic diverticulosis present as well. No abscess or perforation is identified.  Pelvic Genitourinary: Hysterectomy. Urinary bladder appears normal. No pelvic free fluid.  Bones: Scoliosis and spondylosis. No acute osseous abnormality. No aggressive osseous lesions.  Vasculature: Atherosclerosis.  Tortuous abdominal aorta.  Body Wall: There is an implantable device in the right abdomen, possibly representing an insulin pump. Spinal stimulator is also present.  IMPRESSION: 1. Recurrent pain and colitis. The differential considerations are primarily infection including C difficile or ulcerative colitis/inflammatory bowel disease. 2. Hysterectomy and cholecystectomy with pneumobilia compatible with sphincterotomy.   Electronically Signed   By: Andreas Newport M.D.   On: 01/21/2013 15:19    Scheduled  Meds: . budesonide-formoterol  2 puff Inhalation BID  . ciprofloxacin  400 mg Intravenous Q12H  . fluticasone  2 spray Each Nare Daily  . gabapentin  100 mg Oral BID  . metoCLOPramide (REGLAN) injection  5 mg Intravenous Once  . metronidazole  500 mg Intravenous Q8H  . pantoprazole (PROTONIX) IV  40 mg Intravenous Q12H  . sodium chloride  3 mL Intravenous Q12H   Continuous Infusions: . sodium chloride 125 mL/hr (01/23/13 1238)    Time spent: >30 minutes   CHIU, STEPHEN K  [facility] Pager [phone]. If 7PM-7AM, please contact night-coverage at [email or web address], password [facility] 01/23/2013, 12:57 PM  LOS: 2 days

## 2013-01-23 NOTE — Progress Notes (Signed)
Pt experiencing dry heaving and complaining of nausea and an upset stomach. RN gave pt PRN Zofran. Zofran did not alleviate pt symptoms. RN paged MD. Orders given for a one time dose of phenergan. Will continue to monitor.   Genevive Bi, RN

## 2013-01-24 ENCOUNTER — Encounter (HOSPITAL_COMMUNITY)
Admission: EM | Disposition: A | Discharge status: Home / Self Care | Payer: Self-pay | Source: Home / Self Care | Attending: Internal Medicine

## 2013-01-24 ENCOUNTER — Encounter (HOSPITAL_COMMUNITY): Payer: Self-pay | Admitting: *Deleted

## 2013-01-24 DIAGNOSIS — J438 Other emphysema: Secondary | ICD-10-CM

## 2013-01-24 HISTORY — PX: FLEXIBLE SIGMOIDOSCOPY: SHX5431

## 2013-01-24 SURGERY — SIGMOIDOSCOPY, FLEXIBLE
Anesthesia: Moderate Sedation

## 2013-01-24 MED ORDER — ONDANSETRON 8 MG/NS 50 ML IVPB
8.0000 mg | Freq: Three times a day (TID) | INTRAVENOUS | Status: DC | PRN
  Filled 2013-01-24: qty 8

## 2013-01-24 MED ORDER — SODIUM CHLORIDE 0.9 % IV SOLN
INTRAVENOUS | Status: DC
  Administered 2013-01-24: 08:00:00 via INTRAVENOUS

## 2013-01-24 MED ORDER — MIDAZOLAM HCL 5 MG/ML IJ SOLN
INTRAMUSCULAR | Status: AC
  Filled 2013-01-24: qty 1

## 2013-01-24 MED ORDER — POLYETHYLENE GLYCOL 3350 17 G PO PACK
17.0000 g | PACK | Freq: Every day | ORAL | Status: DC | PRN
  Filled 2013-01-24: qty 1

## 2013-01-24 MED ORDER — FENTANYL CITRATE 0.05 MG/ML IJ SOLN
INTRAMUSCULAR | Status: DC | PRN
  Administered 2013-01-24: 25 ug via INTRAVENOUS

## 2013-01-24 MED ORDER — BISACODYL 10 MG RE SUPP
10.0000 mg | Freq: Once | RECTAL | Status: AC
  Administered 2013-01-24: 10 mg via RECTAL
  Filled 2013-01-24: qty 1

## 2013-01-24 MED ORDER — PROMETHAZINE HCL 25 MG/ML IJ SOLN
12.5000 mg | Freq: Once | INTRAMUSCULAR | Status: AC
  Administered 2013-01-24: 12.5 mg via INTRAVENOUS
  Filled 2013-01-24: qty 1

## 2013-01-24 MED ORDER — FENTANYL CITRATE 0.05 MG/ML IJ SOLN
INTRAMUSCULAR | Status: AC
  Filled 2013-01-24: qty 2

## 2013-01-24 MED ORDER — POLYETHYLENE GLYCOL 3350 17 G PO PACK: 17.0000 g | PACK | Freq: Every day | ORAL | Status: DC

## 2013-01-24 MED ORDER — POLYETHYLENE GLYCOL 3350 17 G PO PACK
17.0000 g | PACK | Freq: Two times a day (BID) | ORAL | Status: DC
  Administered 2013-01-24: 17 g via ORAL
  Filled 2013-01-24 (×8): qty 1

## 2013-01-24 MED ORDER — MIDAZOLAM HCL 10 MG/2ML IJ SOLN
INTRAMUSCULAR | Status: DC | PRN
  Administered 2013-01-24: 2 mg via INTRAVENOUS

## 2013-01-24 MED ORDER — SODIUM CHLORIDE 0.9 % IV SOLN
INTRAVENOUS | Status: DC
  Administered 2013-01-24: 500 mL via INTRAVENOUS

## 2013-01-24 MED ORDER — MAGNESIUM CITRATE PO SOLN
1.0000 | Freq: Once | ORAL | Status: AC
  Administered 2013-01-24: 1 via ORAL
  Filled 2013-01-24: qty 296

## 2013-01-24 NOTE — Progress Notes (Signed)
[facility] Progress Note  Subjective: Patient complaining of headache, no nausea no stools today.  Objective: Vital signs in last 24 hours: Temp:  [97.5 F (36.4 C)-98.9 F (37.2 C)] 98.2 F (36.8 C) (12/29 0936) Pulse Rate:  [80-99] 85 (12/29 1015) Resp:  [10-20] 16 (12/29 1015) BP: (126-171)/(57-76) 149/66 mmHg (12/29 1015) SpO2:  [95 %-99 %] 96 % (12/29 1015) Weight:  [56.564 kg (124 lb 11.2 oz)] 56.564 kg (124 lb 11.2 oz) (12/29 0450) Weight change: 1.163 kg (2 lb 9 oz)   PE: Unchanged  Lab Results: No results found for this or any previous visit (from the past 24 hour(s)).  Studies/Results: Flexible sigmoidoscopy very limited due to solid stool. The rectal mucosa was normal. Biopsies were taken. Unable to advance beyond 15 cm  Assessment: Colitis by CT scan without endoscopic correlation on limited flexible sigmoidoscopy with solid stool in the rectum arguing against mucosal colitis.  Plan: Will try clear liquid diet discontinue Flagyl do 2 no evidence of pseudomembranes and we'll give MiraLAX to relieve apparent constipation. Unclear whether repeat endoscopic attempts would be useful.    [name] [name] C 01/24/2013, 10:18 AM

## 2013-01-24 NOTE — Evaluation (Signed)
Physical Therapy Evaluation Patient Details Name: JAMELL OPFER MRN: [id number] DOB: 1925/11/24 Today's Date: 01/24/2013 Time: 1746-1810 PT Time Calculation (min): 24 min  PT Assessment / Plan / Recommendation History of Present Illness  Patient is an 79 yo female admitted with abdominal pain, vomiting.  Patient with pan-colitis, weakness.    Clinical Impression  Patient presents with problems listed below.  Will benefit from acute PT to maximize independence prior to discharge home with family.  Feel patient will progress well with PT.  Do not anticipate any f/u PT needs at discharge.    PT Assessment  Patient needs continued PT services    Follow Up Recommendations  No PT follow up;Supervision/Assistance - 24 hour    Does the patient have the potential to tolerate intense rehabilitation      Barriers to Discharge Decreased caregiver support Patient lives alone.  Daughter staying with patient 24/7 at discharge.    Equipment Recommendations  None recommended by PT    Recommendations for Other Services     Frequency Min 3X/week    Precautions / Restrictions Precautions Precautions: Fall Restrictions Weight Bearing Restrictions: No   Pertinent Vitals/Pain Headache 8/10 impacting mobility      Mobility  Bed Mobility Bed Mobility: Sit to Supine Supine to Sit: 5: Supervision;HOB flat Sit to Supine: 5: Supervision;HOB flat Details for Bed Mobility Assistance: No cues needed.  Assist for safety only. Transfers Transfers: Sit to Stand;Stand to Sit Sit to Stand: 4: Min guard;With upper extremity assist;With armrests;From chair/3-in-1 Stand to Sit: 5: Supervision;To bed Details for Transfer Assistance: Verbal cues for hand placement and to scoot to edge of chair before standing.  Assist for balance/safety initially. Ambulation/Gait Ambulation/Gait Assistance: 4: Min guard Ambulation Distance (Feet): 32 Feet Assistive device: Rolling walker Ambulation/Gait Assistance  Details: Verbal cues for safe use of RW.  Patient able to maneuver RW safely.  Fatigued quickly. Gait Pattern: Step-through pattern;Decreased step length - right;Decreased step length - left;Shuffle;Trunk flexed Gait velocity: Slow gait speed    Exercises     PT Diagnosis: Difficulty walking;Abnormality of gait;Generalized weakness;Acute pain  PT Problem List: Decreased strength;Decreased activity tolerance;Decreased balance;Decreased mobility;Decreased knowledge of use of DME;Pain PT Treatment Interventions: DME instruction;Gait training;Functional mobility training;Patient/family education     PT Goals(Current goals can be found in the care plan section) Acute Rehab PT Goals Patient Stated Goal: To feel better. PT Goal Formulation: With patient/family Time For Goal Achievement: 01/31/13 Potential to Achieve Goals: Good  Visit Information  Last PT Received On: 01/24/13 Assistance Needed: +1 History of Present Illness: Patient is an 79 yo female admitted with abdominal pain, vomiting.  Patient with pan-colitis, weakness.         Prior Functioning  Home Living Family/patient expects to be discharged to:: Private residence Living Arrangements: Alone Available Help at Discharge: Family;Available 24 hours/day (Daughter staying with patient) Type of Home: House Home Access: Ramped entrance Entrance Stairs-Number of Steps: 1 Entrance Stairs-Rails: Can reach both (rails on ramp, holds onto door frame to step into house) Home Layout: One level Home Equipment: Walker - 2 wheels;Cane - single point;Bedside commode;Shower seat Additional Comments: uses O2 at night.  Has walk in shower with curtain, no grab bars in shower-recommended. Prior Function Level of Independence: Independent with assistive device(s) (Uses walker or cane outside of home.) Comments: Very independent pta. Communication Communication: No difficulties Dominant Hand: Right    Cognition   Cognition Arousal/Alertness: Awake/alert Behavior During Therapy: WFL for tasks assessed/performed Overall Cognitive Status: Within Functional  Limits for tasks assessed    Extremity/Trunk Assessment Upper Extremity Assessment Upper Extremity Assessment: Overall WFL for tasks assessed Lower Extremity Assessment Lower Extremity Assessment: Generalized weakness Cervical / Trunk Assessment Cervical / Trunk Assessment: Kyphotic;Other exceptions (Scoliosis) Cervical / Trunk Exceptions: Scoliosis; h/o back surgeries x5   Balance Balance Balance Assessed: Yes Static Standing Balance Static Standing - Balance Support: Bilateral upper extremity supported Static Standing - Level of Assistance: 5: Stand by assistance Static Standing - Comment/# of Minutes: 3  End of Session PT - End of Session Equipment Utilized During Treatment: Gait belt Activity Tolerance: Patient limited by fatigue;Patient limited by pain Patient left: in bed;with call bell/phone within reach;with family/visitor present Nurse Communication: Mobility status (Encouraged ambulation with nursing)  GP     Vena Austria 01/24/2013, 6:34 PM Durenda Hurt. Renaldo Fiddler, [facility] Acute Rehab Services Pager [phone]

## 2013-01-24 NOTE — Progress Notes (Signed)
[facility] PROGRESS NOTE  [name] FORDHAM [name]:[id number] DOB: 1925-12-02 DOA: 01/21/2013 PCP: Lemont Fillers., NP  Assessment/Plan: 1-Pan-colitis:Unclear etiology  -Will continue ciprofloxacin and Flagyl with bowel rest -GI has been consulted, unable to perform flex sig secondary to stool -give cathartics with consideration of repeat exam when stool cleared -follow up GI pathogen and c. Diff -She was occult blood positive.  -continue PRN antiemetics  2-Hypertension: PRN Hydralazine.   3-Tachycardia: Tylenol for fever, IV fluids.   4-Chronic pain: patient has PCA pump.  -continue PRN dilaudid  5-Hypokalemia: repleted  6-Chronic Headaches; PRN pain medications.   7-DVT prophylaxis: SCD   Code Status: Full Family Communication: Daughter at bedside Disposition Plan: Home when medically stable   Consultants:  GI (Dr. Madilyn Fireman)  Procedures:  See below for x-ray reports  Antibiotics:  Ciprofloxacin and Flagyl  HPI/Subjective: Still nauseated, but somewhat improved  Objective: Filed Vitals:   01/24/13 1357  BP: 123/53  Pulse: 76  Temp: 98.4 F (36.9 C)  Resp: 18    Intake/Output Summary (Last 24 hours) at 01/24/13 1727 Last data filed at 01/24/13 1300  Gross per 24 hour  Intake    240 ml  Output   1100 ml  Net   -860 ml   Filed Weights   01/22/13 0500 01/23/13 0425 01/24/13 0450  Weight: 54.1 kg (119 lb 4.3 oz) 55.4 kg (122 lb 2.2 oz) 56.564 kg (124 lb 11.2 oz)    Exam:   General:  NAD, complaining of abd pain and N/V  Cardiovascular: tachycardic, no rubs or gallops, S1 and S2  Respiratory: CTA bilaterally  Abdomen: dilaudid pump appreciated on exam (right mid region); tenderness to palpation of her LLQ, no distension, soft and with positive BS  Musculoskeletal: No edema, no cyanosis.  Data Reviewed: Basic Metabolic Panel:  Recent Labs Lab 01/21/13 1011 01/22/13 0425  NA 138 137  K 3.4* 4.8  CL 97 100  CO2 29 31  GLUCOSE  173* 111*  BUN 12 15  CREATININE 0.61 0.74  CALCIUM 8.6 7.4*   Liver Function Tests:  Recent Labs Lab 01/21/13 1011 01/22/13 0425  AST 23 18  ALT 13 10  ALKPHOS 90 74  BILITOT 0.1* 0.1*  PROT 6.8 5.5*  ALBUMIN 3.8 2.7*    Recent Labs Lab 01/21/13 1011  LIPASE 11   CBC:  Recent Labs Lab 01/21/13 1011 01/22/13 0425  WBC 6.2 5.7  NEUTROABS 5.3  --   HGB 14.1 15.4*  HCT 42.1 46.1*  MCV 94.6 95.6  PLT 131* 147*   Cardiac Enzymes:  Recent Labs Lab 01/21/13 1011  TROPONINI <0.30   BNP (last 3 results)  Recent Labs  01/21/13 1011  PROBNP 911.1*   CBG:  Recent Labs Lab 01/22/13 1035  GLUCAP 90    Studies: No results found.  Scheduled Meds: . budesonide-formoterol  2 puff Inhalation BID  . ciprofloxacin  400 mg Intravenous Q12H  . fluticasone  2 spray Each Nare Daily  . gabapentin  100 mg Oral BID  . metoCLOPramide (REGLAN) injection  5 mg Intravenous Once  . pantoprazole (PROTONIX) IV  40 mg Intravenous Q12H  . polyethylene glycol  17 g Oral BID  . sodium chloride  3 mL Intravenous Q12H   Continuous Infusions: . sodium chloride 125 mL/hr at 01/23/13 2227    Time spent: >30 minutes   Kaedon Fanelli K  [facility] Pager [phone]. If 7PM-7AM, please contact night-coverage at [email or web address], password [facility] 01/24/2013, 5:27 PM  LOS: 3  days

## 2013-01-24 NOTE — Op Note (Signed)
[facility] [address] [place], [address]   COLONOSCOPY PROCEDURE REPORT  [name], [name] [name]  MR#: [id number] BIRTHDATE: 05/27/1925 , 87  yrs. old GENDER: Female ENDOSCOPIST: Dorena Cookey, MD REFERRED BY: PROCEDURE DATE:  01/24/2013 PROCEDURE: ASA CLASS: INDICATIONS:  pancolitis on CT scan MEDICATIONS: fentanyl 25 mcg, Versed 2 mg  DESCRIPTION OF PROCEDURE: rectal exam revealed fairly solid stool high in the vault. Endoscopic exam limited by solid stool. Mucosa only examined to 15 cm with totally normal appearance with no pseudomembranes or other inflammatory changes. Random biopsies were taken.     COMPLICATIONS: None  ENDOSCOPIC IMPRESSION:very limited exam due to solid stool despite preprocedure enema. No evidence of colitis within the area examined.  RECOMMENDATIONS:discontinue Flagyl and will give MiraLAX to try to clean out:, Consider repeat exam depending on her clinical course.    _______________________________ eSigned:  Dorena Cookey, MD 01/24/2013 10:22 AM

## 2013-01-24 NOTE — Evaluation (Signed)
Occupational Therapy Evaluation Patient Details Name: [name] [name] MRN: [id number] DOB: [birth year] Today's Date: 01/24/2013 Time: 4098-1191 OT Time Calculation (min): 53 min  OT Assessment / Plan / Recommendation History of present illness Pan-colitis:Unclear etiology s/p flex sigmoidoscopy.    Clinical Impression   Pt admitted with above. Pt currently with functional limitations due to ongoing headache and overall weakness-has not eaten since 01/20/13 per daughter and currently on clear liquid diet. Pt is currently supervision for BADL tasks and plans to d/c back home where family can provide level of assist she may need until she is back on her feet.  OT signing off.     OT Assessment  Patient does not need any further OT services    Follow Up Recommendations  No OT follow up    Equipment Recommendations  None recommended by OT    Precautions / Restrictions Precautions Precautions: Fall   Pertinent Vitals/Pain 8/10 headache, declined medication at this time, rest and repositioned.    ADL  Grooming: Performed;Supervision/safety;Set up Where Assessed - Grooming: Unsupported standing;Supported standing Upper Body Bathing: Simulated;Set up Where Assessed - Upper Body Bathing: Supported sitting Lower Body Bathing: Simulated;Supervision/safety Where Assessed - Lower Body Bathing: Supported sit to stand;Supported sitting Upper Body Dressing: Simulated;Set up Where Assessed - Upper Body Dressing: Supported sitting Lower Body Dressing: Simulated;Performed;Supervision/safety Where Assessed - Lower Body Dressing: Supported sit to stand;Supported sitting Toilet Transfer: Research scientist (life sciences) Method: Sit to Programmer, applications: Regular height toilet (did not need grab bars) Toileting - Clothing Manipulation and Hygiene: Performed;Supervision/safety Where Assessed - Engineer, mining and Hygiene: Sit to stand from 3-in-1  or toilet;Standing Transfers/Ambulation Related to ADLs: RW and supervision, vcs for hand placement and walker safety. ADL Comments: Patient moving slow and deliberate due to headache and weakness.  Daughter present and verbalized understanding of patient's current level of function and can provide assist at home 24/7 if needed     Visit Information  Last OT Received On: 01/24/13 History of Present Illness: Pan-colitis:Unclear etiology s/p flex sigmoidoscopy       Prior Functioning     Home Living Family/patient expects to be discharged to:: Private residence Living Arrangements: Alone Available Help at Discharge: Family;Available 24 hours/day Type of Home: House Home Access: Stairs to enter;Ramped entrance Entrance Stairs-Number of Steps: 1 Entrance Stairs-Rails: Can reach both (rails on ramp, holds onto door frame to step into house) Home Layout: One level Home Equipment: Emergency planning/management officer - 2 wheels;Walker - 4 wheels;Cane - single point;Bedside commode Additional Comments: uses O2 at night.  Has walk in shower with curtain, no grab bars in shower-recommended. Prior Function Comments: Uses AD when out in the community, holds onto furniture in the house if needed, otherwise modified Independent PTA "I even mow my grass with riding lawn mower" Communication Communication: No difficulties Dominant Hand: Right    Vision/Perception Vision - History Baseline Vision: Wears glasses all the time   Cognition  Cognition Arousal/Alertness: Awake/alert Behavior During Therapy: WFL for tasks assessed/performed Overall Cognitive Status: Within Functional Limits for tasks assessed    Extremity/Trunk Assessment Upper Extremity Assessment Upper Extremity Assessment: Overall WFL for tasks assessed Lower Extremity Assessment Lower Extremity Assessment: Defer to PT evaluation     Mobility Bed Mobility Bed Mobility: Supine to Sit Supine to Sit: 5: Supervision;HOB  flat Transfers Transfers: Sit to Stand;Stand to Sit Sit to Stand: From bed;From toilet;5: Supervision Stand to Sit: To chair/3-in-1;To toilet;5: Supervision Details for Transfer Assistance: able to stand from low  commode without use of grab bar.  Able to transition from supine to sit EOB without use of bed rails.  Required occasional vcs for hand placement with RW.     End of Session OT - End of Session Equipment Utilized During Treatment: Gait belt;Rolling walker Activity Tolerance: Patient tolerated treatment well Patient left: in chair;with call bell/phone within reach;with family/visitor present  GO     [name] [name] 01/24/2013, 4:29 PM

## 2013-01-25 ENCOUNTER — Encounter (HOSPITAL_COMMUNITY): Payer: Self-pay | Admitting: Gastroenterology

## 2013-01-25 LAB — BASIC METABOLIC PANEL
BUN: 5 mg/dL — ABNORMAL LOW (ref 6–23)
CO2: 28 mEq/L (ref 19–32)
Calcium: 7.1 mg/dL — ABNORMAL LOW (ref 8.4–10.5)
Chloride: 100 mEq/L (ref 96–112)
Creatinine, Ser: 0.58 mg/dL (ref 0.50–1.10)
GFR calc Af Amer: >90 mL/min (ref >90)
GFR calc non Af Amer: 82 mL/min — ABNORMAL LOW (ref >90)
Glucose, Bld: 115 mg/dL — ABNORMAL HIGH (ref 70–99)
Potassium: 3.8 mEq/L (ref 3.7–5.3)
Sodium: 138 mEq/L (ref 137–147)

## 2013-01-25 LAB — CBC
HCT: 38.9 % (ref 36.0–46.0)
Hemoglobin: 13 g/dL (ref 12.0–15.0)
MCH: 31.6 pg (ref 26.0–34.0)
MCHC: 33.4 g/dL (ref 30.0–36.0)
MCV: 94.4 fL (ref 78.0–100.0)
Platelets: 103 10*3/uL — ABNORMAL LOW (ref 150–400)
RBC: 4.12 MIL/uL (ref 3.87–5.11)
RDW: 13.7 % (ref 11.5–15.5)
WBC: 2.1 10*3/uL — ABNORMAL LOW (ref 4.0–10.5)

## 2013-01-25 MED ORDER — GI COCKTAIL ~~LOC~~
30.0000 mL | Freq: Once | ORAL | Status: DC
  Filled 2013-01-25: qty 30

## 2013-01-25 MED ORDER — MENTHOL 3 MG MT LOZG
1.0000 | LOZENGE | OROMUCOSAL | Status: DC | PRN
  Filled 2013-01-25: qty 9

## 2013-01-25 MED ORDER — ONDANSETRON HCL 4 MG/2ML IJ SOLN
4.0000 mg | Freq: Four times a day (QID) | INTRAMUSCULAR | Status: DC | PRN
  Administered 2013-01-25: 4 mg via INTRAVENOUS
  Filled 2013-01-25: qty 2

## 2013-01-25 MED ORDER — PHENOL 1.4 % MT LIQD
1.0000 | OROMUCOSAL | Status: DC | PRN
  Filled 2013-01-25: qty 177

## 2013-01-25 NOTE — Progress Notes (Signed)
Pt c/o nausea after eating; no PRN meds ordered; MD paged to make aware; will await callback.

## 2013-01-25 NOTE — Progress Notes (Signed)
Pt daughter called RN to room; pt sleeping and daughter unable to wake pt up; pt entire body jerking; arms up in the air; upon RN entering pt woke up; pt states she was having a bad dream; will page MD to make aware; pt states she believes this has happened to her before after taking Zofran.

## 2013-01-25 NOTE — Progress Notes (Signed)
[facility] PROGRESS NOTE  [name] [name] ZOX:[id number] DOB: Mar 19, 1925 DOA: 01/21/2013 PCP: Lemont Fillers., NP  Assessment/Plan: 1-Pan-colitis: -Unclear etiology  -Improved s/p cathartics and enema yesterday -Will continue ciprofloxacin and Flagyl with bowel rest -follow up GI pathogen and c. Diff -She was occult blood positive.  -continue PRN antiemetics -GI following -Possible EGD w/ repeat flex sig/colon later  2-Hypertension: PRN Hydralazine.   3-Tachycardia: Tylenol for fever, IV fluids.   4-Chronic pain: patient has PCA pump.  -continue PRN dilaudid  5-Hypokalemia: repleted  6-Chronic Headaches; PRN pain medications.   7-DVT prophylaxis: SCD   Code Status: Full Family Communication: Daughter at bedside Disposition Plan: Home when medically stable   Consultants:  GI (Dr. Madilyn Fireman)  Procedures:  See below for x-ray reports  Antibiotics:  Ciprofloxacin and Flagyl  HPI/Subjective: Still nauseated, but improved  Objective: Filed Vitals:   01/25/13 0501  BP: 143/65  Pulse: 102  Temp: 97.4 F (36.3 C)  Resp: 20    Intake/Output Summary (Last 24 hours) at 01/25/13 1254 Last data filed at 01/25/13 0502  Gross per 24 hour  Intake    240 ml  Output   1000 ml  Net   -760 ml   Filed Weights   01/23/13 0425 01/24/13 0450 01/25/13 0501  Weight: 55.4 kg (122 lb 2.2 oz) 56.564 kg (124 lb 11.2 oz) 56.155 kg (123 lb 12.8 oz)    Exam:   General:  NAD, complaining of abd pain and N/V  Cardiovascular: tachycardic, no rubs or gallops, S1 and S2  Respiratory: CTA bilaterally  Abdomen: dilaudid pump appreciated on exam (right mid region); tenderness to palpation of her LLQ, no distension, soft and with positive BS  Musculoskeletal: No edema, no cyanosis.  Data Reviewed: Basic Metabolic Panel:  Recent Labs Lab 01/21/13 1011 01/22/13 0425 01/25/13 0740  NA 138 137 138  K 3.4* 4.8 3.8  CL 97 100 100  CO2 29 31 28   GLUCOSE 173*  111* 115*  BUN 12 15 5*  CREATININE 0.61 0.74 0.58  CALCIUM 8.6 7.4* 7.1*   Liver Function Tests:  Recent Labs Lab 01/21/13 1011 01/22/13 0425  AST 23 18  ALT 13 10  ALKPHOS 90 74  BILITOT 0.1* 0.1*  PROT 6.8 5.5*  ALBUMIN 3.8 2.7*    Recent Labs Lab 01/21/13 1011  LIPASE 11   CBC:  Recent Labs Lab 01/21/13 1011 01/22/13 0425 01/25/13 0740  WBC 6.2 5.7 2.1*  NEUTROABS 5.3  --   --   HGB 14.1 15.4* 13.0  HCT 42.1 46.1* 38.9  MCV 94.6 95.6 94.4  PLT 131* 147* 103*   Cardiac Enzymes:  Recent Labs Lab 01/21/13 1011  TROPONINI <0.30   BNP (last 3 results)  Recent Labs  01/21/13 1011  PROBNP 911.1*   CBG:  Recent Labs Lab 01/22/13 1035  GLUCAP 90    Studies: No results found.  Scheduled Meds: . budesonide-formoterol  2 puff Inhalation BID  . ciprofloxacin  400 mg Intravenous Q12H  . fluticasone  2 spray Each Nare Daily  . gabapentin  100 mg Oral BID  . gi cocktail  30 mL Oral Once  . metoCLOPramide (REGLAN) injection  5 mg Intravenous Once  . pantoprazole (PROTONIX) IV  40 mg Intravenous Q12H  . polyethylene glycol  17 g Oral BID  . sodium chloride  3 mL Intravenous Q12H   Continuous Infusions: . sodium chloride 125 mL (01/25/13 0554)    Time spent: >30 minutes   Leamon Palau  K  [facility] Pager [phone]. If 7PM-7AM, please contact night-coverage at [email or web address], password [facility] 01/25/2013, 12:54 PM  LOS: 4 days

## 2013-01-25 NOTE — Progress Notes (Signed)
[facility] Progress Note  Subjective: Feels better today after good bowel movements with MiraLAX. Less nausea less pain.  Objective: Vital signs in last 24 hours: Temp:  [97.4 F (36.3 C)-98.6 F (37 C)] 97.4 F (36.3 C) (12/30 0501) Pulse Rate:  [76-102] 102 (12/30 0501) Resp:  [10-23] 20 (12/30 0501) BP: (123-180)/(53-80) 143/65 mmHg (12/30 0501) SpO2:  [92 %-100 %] 92 % (12/30 0501) Weight:  [56.155 kg (123 lb 12.8 oz)] 56.155 kg (123 lb 12.8 oz) (12/30 0501) Weight change: -0.408 kg (-14.4 oz)   ZO:XWRUE oriented in no acute distress abdomen soft  Lab Results: Results for orders placed during the hospital encounter of 01/21/13 (from the past 24 hour(s))  CBC     Status: Abnormal   Collection Time    01/25/13  7:40 AM      Result Value Range   WBC 2.1 (*) 4.0 - 10.5 K/uL   RBC 4.12  3.87 - 5.11 MIL/uL   Hemoglobin 13.0  12.0 - 15.0 g/dL   HCT 45.4  09.8 - 11.9 %   MCV 94.4  78.0 - 100.0 fL   MCH 31.6  26.0 - 34.0 pg   MCHC 33.4  30.0 - 36.0 g/dL   RDW 14.7  82.9 - 56.2 %   Platelets 103 (*) 150 - 400 K/uL  BASIC METABOLIC PANEL     Status: Abnormal   Collection Time    01/25/13  7:40 AM      Result Value Range   Sodium 138  137 - 147 mEq/L   Potassium 3.8  3.7 - 5.3 mEq/L   Chloride 100  96 - 112 mEq/L   CO2 28  19 - 32 mEq/L   Glucose, Bld 115 (*) 70 - 99 mg/dL   BUN 5 (*) 6 - 23 mg/dL   Creatinine, Ser 1.30  0.50 - 1.10 mg/dL   Calcium 7.1 (*) 8.4 - 10.5 mg/dL   GFR calc non Af Amer 82 (*) >90 mL/min   GFR calc Af Amer >90  >90 mL/min    Studies/Results: No results found.    Assessment: Abdominal pain nausea and vomiting with CT appearance of colitis with clinical presentation and endoscopic findings not consistent based on limited sigmoidoscopy yesterday.  Plan: Not sure how aggressive to be in terms of trying to prep her for repeat full colonoscopy in  her frail state. Her daughters are inclined to give her a day or 2 to see how she does  clinically with passage of stool. I will await pathology from normal appearing rectal biopsies yesterday. If she has recurrent vomiting could consider repeat EGD at the time of the another attempt at flexible sigmoidoscopy or colonoscopy. Will try full liquids today.    Geordie Nooney C 01/25/2013, 9:16 AM

## 2013-01-25 NOTE — Progress Notes (Signed)
Physical Therapy Treatment Patient Details Name: [name] [name] MRN: [id number] DOB: 12-26-1925 Today's Date: 01/25/2013 Time: 4098-1191 PT Time Calculation (min): 21 min  PT Assessment / Plan / Recommendation  History of Present Illness Patient is an 79 yo female admitted with abdominal pain, vomiting.  Patient with pan-colitis, weakness.     PT Comments   Pt not very self motivated, but progressing steadily.  She should do well as she feels better.   Follow Up Recommendations  No PT follow up;Supervision/Assistance - 24 hour     Does the patient have the potential to tolerate intense rehabilitation     Barriers to Discharge        Equipment Recommendations  None recommended by PT    Recommendations for Other Services    Frequency Min 3X/week   Progress towards PT Goals Progress towards PT goals: Progressing toward goals  Plan Current plan remains appropriate    Precautions / Restrictions Precautions Precautions: Fall Restrictions Weight Bearing Restrictions: No   Pertinent Vitals/Pain     Mobility  Bed Mobility Bed Mobility: Sit to Supine Supine to Sit: 5: Supervision;HOB flat Details for Bed Mobility Assistance: No cues needed.  Assist for safety only. Transfers Transfers: Sit to Stand;Stand to Sit Sit to Stand: 5: Supervision;From bed;From chair/3-in-1 Stand to Sit: 5: Supervision;To chair/3-in-1;To bed Details for Transfer Assistance: Verbal cues for hand placement and to scoot to edge of chair before standing.  Assist for balance/safety initially. Ambulation/Gait Ambulation/Gait Assistance: 4: Min guard Ambulation Distance (Feet): 60 Feet Assistive device: Rolling walker Ambulation/Gait Assistance Details: postural cues and better use of RW Gait Pattern: Step-through pattern;Decreased stride length Gait velocity: Slow gait speed Stairs: No    Exercises     PT Diagnosis:    PT Problem List:   PT Treatment Interventions:     PT Goals (current goals  can now be found in the care plan section) Acute Rehab PT Goals PT Goal Formulation: With patient/family Time For Goal Achievement: 01/31/13 Potential to Achieve Goals: Good  Visit Information  Last PT Received On: 01/25/13 Assistance Needed: +1 History of Present Illness: Patient is an 79 yo female admitted with abdominal pain, vomiting.  Patient with pan-colitis, weakness.      Subjective Data  Subjective: you better come back, I need to use the potty   Cognition  Cognition Arousal/Alertness: Awake/alert Behavior During Therapy: WFL for tasks assessed/performed;Flat affect Overall Cognitive Status: Within Functional Limits for tasks assessed    Balance  Static Standing Balance Static Standing - Balance Support: No upper extremity supported;Right upper extremity supported;Left upper extremity supported Static Standing - Level of Assistance: 5: Stand by assistance  End of Session PT - End of Session Equipment Utilized During Treatment: Gait belt Activity Tolerance: Patient limited by fatigue;Patient limited by pain Patient left: in bed;with call bell/phone within reach;with family/visitor present Nurse Communication: Mobility status   GP     [name] [name], Eliseo Gum 01/25/2013, 1:25 PM 01/25/2013   [place], PT [phone]  (pager)

## 2013-01-25 NOTE — Progress Notes (Signed)
Pt c/o nausea; pt given IV Zofran at this time per MD orders; will cont. To monitor.

## 2013-01-26 DIAGNOSIS — M549 Dorsalgia, unspecified: Secondary | ICD-10-CM

## 2013-01-26 LAB — CBC
HCT: 35.9 % — ABNORMAL LOW (ref 36.0–46.0)
Hemoglobin: 11.6 g/dL — ABNORMAL LOW (ref 12.0–15.0)
MCH: 30.9 pg (ref 26.0–34.0)
MCHC: 32.3 g/dL (ref 30.0–36.0)
MCV: 95.5 fL (ref 78.0–100.0)
Platelets: 73 10*3/uL — ABNORMAL LOW (ref 150–400)
RBC: 3.76 MIL/uL — ABNORMAL LOW (ref 3.87–5.11)
RDW: 14 % (ref 11.5–15.5)
WBC: 2.8 10*3/uL — ABNORMAL LOW (ref 4.0–10.5)

## 2013-01-26 MED ORDER — CIPROFLOXACIN HCL 500 MG PO TABS
500.0000 mg | ORAL_TABLET | Freq: Two times a day (BID) | ORAL | Status: DC
  Administered 2013-01-26 – 2013-01-27 (×3): 500 mg via ORAL
  Filled 2013-01-26 (×5): qty 1

## 2013-01-26 MED ORDER — PANTOPRAZOLE SODIUM 40 MG PO TBEC
40.0000 mg | DELAYED_RELEASE_TABLET | Freq: Two times a day (BID) | ORAL | Status: DC
  Administered 2013-01-26 – 2013-01-27 (×2): 40 mg via ORAL
  Filled 2013-01-26 (×2): qty 1

## 2013-01-26 NOTE — Care Management Note (Unsigned)
    Page 1 of 1   01/26/2013     3:46:27 PM   CARE MANAGEMENT NOTE 01/26/2013  Patient:  [name] [name], [name] [name]   Account Number:  [id number]  Date Initiated:  01/26/2013  Documentation initiated by:  [place] Se[place]  Subjective/Objective Assessment:   PT ADM ON 12/26 WITH N/V, ABD PAIN.  PTA, PT INDEPENDENT, LIVES ALONE.  HAS SUPPORTIVE FAMILY.     Action/Plan:   P.T. HAS EVALUATED; NO FOLLOW UP RECOMMENDED.  WILL CONT TO FOLLOW PROGRESS.   Anticipated DC Date:  01/27/2013   Anticipated DC Plan:  HOME/SELF CARE      DC Planning Services  CM consult      Choice offered to / List presented to:             Status of service:  In process, will continue to follow Medicare Important Message given?   (If response is "NO", the following Medicare IM given date fields will be blank) Date Medicare IM given:   Date Additional Medicare IM given:    Discharge Disposition:    Per UR Regulation:  Reviewed for med. necessity/level of care/duration of stay  If discussed at Long Length of Stay Meetings, dates discussed:    Comments:

## 2013-01-26 NOTE — Progress Notes (Signed)
[facility] Progress Note  Subjective: Feels much better today. Having semi-loose nonbloody stools tolerating full liquid diet  Objective: Vital signs in last 24 hours: Temp:  [97.3 F (36.3 C)-98.8 F (37.1 C)] 97.3 F (36.3 C) (12/31 0513) Pulse Rate:  [71-88] 71 (12/31 0513) Resp:  [16-17] 17 (12/31 0513) BP: (117-134)/(51-65) 134/65 mmHg (12/31 0513) SpO2:  [92 %-100 %] 100 % (12/31 0513) Weight:  [57.788 kg (127 lb 6.4 oz)] 57.788 kg (127 lb 6.4 oz) (12/31 0513) Weight change: 1.633 kg (3 lb 9.6 oz)   PE: Abdomen soft nontender  Lab Results: Results for orders placed during the hospital encounter of 01/21/13 (from the past 24 hour(s))  CBC     Status: Abnormal (Preliminary result)   Collection Time    01/26/13  8:40 AM      Result Value Range   WBC 2.8 (*) 4.0 - 10.5 K/uL   RBC 3.76 (*) 3.87 - 5.11 MIL/uL   Hemoglobin 11.6 (*) 12.0 - 15.0 g/dL   HCT 16.1 (*) 09.6 - 04.5 %   MCV 95.5  78.0 - 100.0 fL   MCH 30.9  26.0 - 34.0 pg   MCHC 32.3  30.0 - 36.0 g/dL   RDW 40.9  81.1 - 91.4 %   Platelets PENDING  150 - 400 K/uL    Studies/Results: No results found.    Assessment: Nausea vomiting abdominal pain with radiologic appearance of pancolitis but without blood or diarrhea and rectosigmoid biopsies from recent sigmoidoscopy normal.  Plan: I would tend to treat her based on clinical grounds and since she is improving switch Cipro to oral route for a few days. Will try advancing to regular diet. Hopefully can go home tomorrow.    Myrick Mcnairy C 01/26/2013, 9:35 AM

## 2013-01-26 NOTE — Progress Notes (Signed)
[facility] PROGRESS NOTE  [name] [name] ZOX:[id number] DOB: 10-Oct-1925 DOA: 01/21/2013 PCP: Lemont Fillers., NP  Assessment/Plan: 1-Pan-colitis: -Improvement this AM -Reports cont BM overnight -Improved s/p cathartics and enema recently -Will continue ciprofloxacin and Flagyl for now -follow up GI pathogen and c. Diff -She was occult blood positive.  -continue PRN antiemetics -GI following -Possible EGD w/ repeat flex sig/colon later  2-Hypertension: PRN Hydralazine.   3-Tachycardia: Tylenol for fever, IV fluids.   4-Chronic pain: patient has PCA pump.  -continue PRN dilaudid  5-Hypokalemia: repleted  6-Chronic Headaches; PRN pain medications.   7-DVT prophylaxis: SCD   Code Status: Full Family Communication: Daughter at bedside Disposition Plan: Home when medically stable   Consultants:  GI (Dr. Madilyn Fireman)  Procedures:  See below for x-ray reports  Antibiotics:  Ciprofloxacin and Flagyl  HPI/Subjective: eports feeling better this AM. Tolerating full liquid diet  Objective: Filed Vitals:   01/26/13 0513  BP: 134/65  Pulse: 71  Temp: 97.3 F (36.3 C)  Resp: 17    Intake/Output Summary (Last 24 hours) at 01/26/13 0731 Last data filed at 01/26/13 0525  Gross per 24 hour  Intake 2408.75 ml  Output    950 ml  Net 1458.75 ml   Filed Weights   01/24/13 0450 01/25/13 0501 01/26/13 0513  Weight: 56.564 kg (124 lb 11.2 oz) 56.155 kg (123 lb 12.8 oz) 57.788 kg (127 lb 6.4 oz)    Exam:   General:  NAD, complaining of abd pain and N/V  Cardiovascular: tachycardic, no rubs or gallops, S1 and S2  Respiratory: CTA bilaterally  Abdomen: dilaudid pump appreciated on exam (right mid region); tenderness to palpation of her LLQ, no distension, soft and with positive BS  Musculoskeletal: No edema, no cyanosis.  Data Reviewed: Basic Metabolic Panel:  Recent Labs Lab 01/21/13 1011 01/22/13 0425 01/25/13 0740  NA 138 137 138  K 3.4* 4.8  3.8  CL 97 100 100  CO2 29 31 28   GLUCOSE 173* 111* 115*  BUN 12 15 5*  CREATININE 0.61 0.74 0.58  CALCIUM 8.6 7.4* 7.1*   Liver Function Tests:  Recent Labs Lab 01/21/13 1011 01/22/13 0425  AST 23 18  ALT 13 10  ALKPHOS 90 74  BILITOT 0.1* 0.1*  PROT 6.8 5.5*  ALBUMIN 3.8 2.7*    Recent Labs Lab 01/21/13 1011  LIPASE 11   CBC:  Recent Labs Lab 01/21/13 1011 01/22/13 0425 01/25/13 0740  WBC 6.2 5.7 2.1*  NEUTROABS 5.3  --   --   HGB 14.1 15.4* 13.0  HCT 42.1 46.1* 38.9  MCV 94.6 95.6 94.4  PLT 131* 147* 103*   Cardiac Enzymes:  Recent Labs Lab 01/21/13 1011  TROPONINI <0.30   BNP (last 3 results)  Recent Labs  01/21/13 1011  PROBNP 911.1*   CBG:  Recent Labs Lab 01/22/13 1035  GLUCAP 90    Studies: No results found.  Scheduled Meds: . budesonide-formoterol  2 puff Inhalation BID  . ciprofloxacin  400 mg Intravenous Q12H  . fluticasone  2 spray Each Nare Daily  . gabapentin  100 mg Oral BID  . gi cocktail  30 mL Oral Once  . metoCLOPramide (REGLAN) injection  5 mg Intravenous Once  . pantoprazole (PROTONIX) IV  40 mg Intravenous Q12H  . polyethylene glycol  17 g Oral BID  . sodium chloride  3 mL Intravenous Q12H   Continuous Infusions: . sodium chloride 125 mL/hr at 01/25/13 1537    Time spent: >30  minutes   CHIU, STEPHEN K  [facility] Pager [phone]. If 7PM-7AM, please contact night-coverage at [email or web address], password [facility] 01/26/2013, 7:31 AM  LOS: 5 days

## 2013-01-26 NOTE — Progress Notes (Signed)
Physical Therapy Treatment Patient Details Name: KAILEA DANNEMILLER MRN: [id number] DOB: Dec 31, 1925 Today's Date: 01/26/2013 Time: 1478-2956 PT Time Calculation (min): 16 min  PT Assessment / Plan / Recommendation  History of Present Illness Patient is an 79 yo female admitted with abdominal pain, vomiting.  Patient with pan-colitis, weakness.     PT Comments   Pt progressing well as expected though she doesn't acknowledge doing much better.  Pt can d/c home with available assist and no follow up.  Follow Up Recommendations  No PT follow up;Supervision/Assistance - 24 hour     Does the patient have the potential to tolerate intense rehabilitation     Barriers to Discharge        Equipment Recommendations  None recommended by PT    Recommendations for Other Services    Frequency Min 3X/week   Progress towards PT Goals Progress towards PT goals: Goals met and updated - see care plan  Plan Current plan remains appropriate    Precautions / Restrictions Precautions Precautions: Fall   Pertinent Vitals/Pain sats on RA dropped from 92% at rest to 85% with amb.  HR at rest in low 90's with exertion in 110's.  Return to 90% on RA (once sitting) in 30 secs.    Mobility  Bed Mobility Bed Mobility: Supine to Sit Supine to Sit: 5: Supervision;With rails;HOB flat Details for Bed Mobility Assistance: no cues or assist needed Transfers Transfers: Sit to Stand;Stand to Sit Sit to Stand: 5: Supervision;From bed;With upper extremity assist Stand to Sit: 5: Supervision;To bed Details for Transfer Assistance: Verbal cues for hand placement and to scoot to edge of chair before standing.  Assist for balance/safety initially. Ambulation/Gait Ambulation/Gait Assistance: 4: Min guard Ambulation Distance (Feet): 105 Feet Assistive device: Rolling walker Ambulation/Gait Assistance Details: generally steady, but shuffled R step due to hip drop R from scoliosis Gait Pattern: Step-through  pattern;Decreased stride length Gait velocity: Slow gait speed Stairs: No    Exercises     PT Diagnosis:    PT Problem List:   PT Treatment Interventions:     PT Goals (current goals can now be found in the care plan section) Acute Rehab PT Goals PT Goal Formulation: With patient/family Time For Goal Achievement: 01/31/13 Potential to Achieve Goals: Good  Visit Information  Last PT Received On: 01/26/13 Assistance Needed: +1 History of Present Illness: Patient is an 79 yo female admitted with abdominal pain, vomiting.  Patient with pan-colitis, weakness.      Subjective Data  Subjective: I don't know if I feel better   Cognition  Cognition Arousal/Alertness: Awake/alert Behavior During Therapy: WFL for tasks assessed/performed;Flat affect Overall Cognitive Status: Within Functional Limits for tasks assessed    Balance  Balance Balance Assessed: No  End of Session PT - End of Session Activity Tolerance: Patient tolerated treatment well;Patient limited by fatigue Patient left: Other (comment);with call bell/phone within reach;with family/visitor present (sitting EOB) Nurse Communication: Mobility status   GP     Evart Mcdonnell, Eliseo Gum 01/26/2013, 3:59 PM 01/26/2013  [place], PT [phone]  (pager)

## 2013-01-27 ENCOUNTER — Telehealth: Payer: Self-pay | Admitting: Physician Assistant

## 2013-01-27 DIAGNOSIS — D649 Anemia, unspecified: Secondary | ICD-10-CM

## 2013-01-27 MED ORDER — POLYETHYLENE GLYCOL 3350 17 G PO PACK: 17.0000 g | PACK | Freq: Every day | ORAL | Status: DC | PRN

## 2013-01-27 MED ORDER — LACTULOSE SOLN: 2.0000 mL | Freq: Every evening | Status: DC | PRN

## 2013-01-27 MED ORDER — CIPROFLOXACIN HCL 500 MG PO TABS: 500.0000 mg | ORAL_TABLET | Freq: Two times a day (BID) | ORAL | Status: DC

## 2013-01-27 NOTE — Telephone Encounter (Signed)
Patient discharged from hospital on 01/27/13.  Can we make sure patient has follow-up appointment scheduled with [name] (PCP) in 1-2 weeks.  I want to make sure this patient has appropriate follow-up.

## 2013-01-27 NOTE — Discharge Summary (Signed)
Physician Discharge Summary  [name] [name] KGY:[id number] DOB: 22-Mar-1925 DOA: 01/21/2013  PCP: [name], NP  Admit date: 01/21/2013 Discharge date: 01/27/2013  Time spent: 35 minutes  Recommendations for Outpatient Follow-up:  1. Follow up with PCP in 1-2 weeks  Discharge Diagnoses:  Principal Problem:   Pancolitis Active Problems:   HYPERTENSION   Chronic tension headaches   Sinus tachycardia   Discharge Condition: Improved  Diet recommendation: High fiber  Filed Weights   01/25/13 0501 01/26/13 0513 01/27/13 0556  Weight: 56.155 kg (123 lb 12.8 oz) 57.788 kg (127 lb 6.4 oz) 56.8 kg (125 lb 3.5 oz)    History of present illness:  [name] [name] is a 80 y.o. female with PMH significant for Diastolic heart failure EF 60 % by ECHO 2013, scoliosis deformity of the spine intractable pain on dilaudid PCA pump, prior history of pan-colitis 09-2011 who presents with nausea, multiple episodes of vomiting, fluid content, abdominal pain that started night prior to admission. She denies diarrhea.  She is also complaining of her chronic headaches.   Hospital Course:  1-Pan-colitis:  -Mod-large amounts of stool seen on imaging per my own read -Sx improved s/p cathartics and enema  -Continued on ciprofloxacin and Flagyl with GI recs for completing PO cipro on d/c -She was occult blood positive.  -continue PRN antiemetics  -GI following  -Attempted flex sig with normal biopsies noted  2-Hypertension: PRN Hydralazine.  3-Tachycardia: Tylenol for fever, IV fluids.  4-Chronic pain: patient has PCA pump.  -continued PRN dilaudid  5-Hypokalemia: repleted  6-Chronic Headaches; PRN pain medications.  7-DVT prophylaxis: SCD   Procedures:  Flex sig 01/24/13  Consultations:  GI - Dr. Amedeo Plenty  Discharge Exam: Filed Vitals:   01/26/13 1433 01/26/13 2138 01/27/13 0556 01/27/13 0834  BP: 128/70 156/63 140/61   Pulse: 80 91 81 88  Temp: 98.4 F (36.9 C) 98.6 F (37 C)  98 F (36.7 C)   TempSrc: Oral Oral Oral   Resp: 18 18 18 16   Height:      Weight:   56.8 kg (125 lb 3.5 oz)   SpO2: 92% 90% 98%     General: Awake, in nad Cardiovascular: regular, s1, s2 Respiratory: normal resp effort, no wheezing  Discharge Instructions       Future Appointments Provider Department Dept Phone   03/30/2013 1:15 PM Debbrah Alar, NP [place] at  [facility] [phone]       Medication List         albuterol 108 (90 BASE) MCG/ACT inhaler  Commonly known as:  PROVENTIL HFA;VENTOLIN HFA  Inhale 1-2 puffs into the lungs 3 (three) times daily as needed for wheezing or shortness of breath.     budesonide-formoterol 160-4.5 MCG/ACT inhaler  Commonly known as:  SYMBICORT  Inhale 2 puffs into the lungs 2 (two) times daily.     butalbital-acetaminophen-caffeine 50-325-40 MG per tablet  Commonly known as:  FIORICET, ESGIC  Take 1 tablet by mouth every 8 (eight) hours as needed for headache.     CALCIUM 600-D 600-400 MG-UNIT per tablet  Generic drug:  Calcium Carbonate-Vitamin D  Take 1 tablet by mouth 2 (two) times daily with a meal.     ciprofloxacin 500 MG tablet  Commonly known as:  CIPRO  Take 1 tablet (500 mg total) by mouth 2 (two) times daily.     cyclobenzaprine 10 MG tablet  Commonly known as:  FLEXERIL  Take 1 tablet by mouth 3 (three) times daily  as needed for muscle spasms.     diclofenac sodium 1 % Gel  Commonly known as:  VOLTAREN  Apply 1 application topically 4 (four) times daily as needed. Inflammation     ferrous fumarate-iron polysaccharide complex 162-115.2 MG Caps  Commonly known as:  TANDEM  Take 1 capsule by mouth daily.     fluticasone 50 MCG/ACT nasal spray  Commonly known as:  FLONASE  Place 2 sprays into both nostrils daily.     gabapentin 100 MG capsule  Commonly known as:  NEURONTIN  Take 100 mg by mouth 2 (two) times daily.     HYDROmorphone 4 MG tablet  Commonly known as:  DILAUDID  Take 4 mg by  mouth 2 (two) times daily as needed for moderate pain or severe pain.     hyoscyamine 0.125 MG Tbdp disintergrating tablet  Commonly known as:  ANASPAZ  Place 0.125 mg under the tongue daily.     Lactulose Soln  Take 2 mLs by mouth at bedtime as needed (for constipation).     nitroGLYCERIN 0.4 MG SL tablet  Commonly known as:  NITROSTAT  Place 1 tablet (0.4 mg total) under the tongue every 5 (five) minutes as needed for chest pain.     omeprazole 40 MG capsule  Commonly known as:  PRILOSEC  Take 40 mg by mouth 2 (two) times daily.     oxyCODONE-acetaminophen 5-325 MG per tablet  Commonly known as:  PERCOCET/ROXICET  Take 1 tablet by mouth every 4 (four) hours as needed for moderate pain or severe pain.     polyethylene glycol packet  Commonly known as:  MIRALAX / GLYCOLAX  Take 17 g by mouth daily as needed for mild constipation or moderate constipation.     promethazine 25 MG tablet  Commonly known as:  PHENERGAN  Take 1 tablet (25 mg total) by mouth as needed. Nausea     vitamin C 500 MG tablet  Commonly known as:  ASCORBIC ACID  Take 500 mg by mouth daily.     Vitamin D (Ergocalciferol) 50000 UNITS Caps capsule  Commonly known as:  DRISDOL  Take 50,000 Units by mouth every 7 (seven) days.       Allergies  Allergen Reactions  . Codeine     REACTION: n/v/d, HA  . Morphine     REACTION: n/v/d, HA  . Sulfa Antibiotics   . Zoledronic Acid     REACTION: Severe edema   Follow-up Information   Follow up with [name], NP. Schedule an appointment as soon as possible for a visit in 2 weeks.   Specialty:  Internal Medicine   Contact information:   [place] [address] [phone]        The results of significant diagnostics from this hospitalization (including imaging, microbiology, ancillary and laboratory) are listed below for reference.    Significant Diagnostic Studies: Dg Chest 2 View  01/21/2013   CLINICAL DATA:   80 year old female with shortness of breath and weakness. Initial encounter.  EXAM: CHEST  2 VIEW  COMPARISON:  11/10/2011 and earlier.  FINDINGS: Stable lung volumes. Mild costophrenic angle blunting bilaterally not significantly changed since 2013. No pneumothorax, pulmonary edema or confluent pulmonary opacity. Chronic thoracic spinal stimulator and scoliosis. Normal cardiac size and mediastinal contours. Visualized tracheal air column is within normal limits. Calcified aortic atherosclerosis. No acute osseous abnormality identified.  IMPRESSION: Chronic trace pleural effusions versus pleural scarring. No acute cardiopulmonary abnormality.   Electronically  Signed   By: Lars Pinks M.D.   On: 01/21/2013 12:54   Ct Head Wo Contrast  01/21/2013   CLINICAL DATA:  Headaches with history of hypertension  EXAM: CT HEAD WITHOUT CONTRAST  TECHNIQUE: Contiguous axial images were obtained from the base of the skull through the vertex without intravenous contrast.  COMPARISON:  CT scan of the brain dated October 12, 2011.  FINDINGS: The ventricles are normal in size and position. There is mild diffuse cerebral atrophy which is stable and consistent with chronic small vessel ischemia. There is no shift of the midline. There is no evidence of an acute intracranial hemorrhage. No acute ischemic event is demonstrated. There are punctate basal ganglia calcifications bilaterally. The cerebellum and brainstem exhibit no acute abnormalities.  At bone window settings the observed portions of the paranasal sinuses and mastoid air cells are clear. There is no evidence of an acute skull fracture.  IMPRESSION: 1. There is no evidence of an acute ischemic or hemorrhagic event. There is no intracranial mass effect or hydrocephalus. 2. There are findings consistent with chronic small vessel ischemia and mild diffuse age appropriate cerebral atrophy.   Electronically Signed   By: David  Martinique   On: 01/21/2013 20:20   Ct Abdomen Pelvis  W Contrast  01/21/2013   CLINICAL DATA:  Epigastric pain.  Right upper quadrant pain.  EXAM: CT ABDOMEN AND PELVIS WITH CONTRAST  TECHNIQUE: Multidetector CT imaging of the abdomen and pelvis was performed using the standard protocol following bolus administration of intravenous contrast.  CONTRAST:  19mL OMNIPAQUE IOHEXOL 300 MG/ML  SOLN  COMPARISON:  10/12/2011.  FINDINGS: Lung Bases: Dependent atelectasis.  Liver: Unchanged mild intrahepatic biliary ductal dilation status post cholecystectomy. Small amount of pneumobilia in the left hepatic lobe compatible with prior sphincterotomy.  Spleen:  Normal.  Gallbladder:  Surgically absent.  Clips in the fossa.  Common bile duct:  Within normal limits status post cholecystectomy.  Pancreas:  Expected pancreatic atrophy.  Adrenal glands:  Normal bilaterally.  Kidneys: Normal enhancement. Tiny low-density lesions are present likely representing simple renal cysts. Normal delayed excretion of contrast. Ureters are within normal limits.  Stomach:  Normal.  Small bowel: Uncomplicated periampullary duodenum diverticulum. No small bowel obstruction or inflammatory changes.  Colon: Diffuse colitis is present. Mural thickening extends from the cecum to the rectum. Perirectal fat stranding is present. There is colonic diverticulosis present as well. No abscess or perforation is identified.  Pelvic Genitourinary: Hysterectomy. Urinary bladder appears normal. No pelvic free fluid.  Bones: Scoliosis and spondylosis. No acute osseous abnormality. No aggressive osseous lesions.  Vasculature: Atherosclerosis.  Tortuous abdominal aorta.  Body Wall: There is an implantable device in the right abdomen, possibly representing an insulin pump. Spinal stimulator is also present.  IMPRESSION: 1. Recurrent pain and colitis. The differential considerations are primarily infection including C difficile or ulcerative colitis/inflammatory bowel disease. 2. Hysterectomy and cholecystectomy with  pneumobilia compatible with sphincterotomy.   Electronically Signed   By: Dereck Ligas M.D.   On: 01/21/2013 15:19    Microbiology: No results found for this or any previous visit (from the past 240 hour(s)).   Labs: Basic Metabolic Panel:  Recent Labs Lab 01/21/13 1011 01/22/13 0425 01/25/13 0740  NA 138 137 138  K 3.4* 4.8 3.8  CL 97 100 100  CO2 29 31 28   GLUCOSE 173* 111* 115*  BUN 12 15 5*  CREATININE 0.61 0.74 0.58  CALCIUM 8.6 7.4* 7.1*   Liver Function Tests:  Recent Labs Lab 01/21/13 1011 01/22/13 0425  AST 23 18  ALT 13 10  ALKPHOS 90 74  BILITOT 0.1* 0.1*  PROT 6.8 5.5*  ALBUMIN 3.8 2.7*    Recent Labs Lab 01/21/13 1011  LIPASE 11   No results found for this basename: AMMONIA,  in the last 168 hours CBC:  Recent Labs Lab 01/21/13 1011 01/22/13 0425 01/25/13 0740 01/26/13 0840  WBC 6.2 5.7 2.1* 2.8*  NEUTROABS 5.3  --   --   --   HGB 14.1 15.4* 13.0 11.6*  HCT 42.1 46.1* 38.9 35.9*  MCV 94.6 95.6 94.4 95.5  PLT 131* 147* 103* 73*   Cardiac Enzymes:  Recent Labs Lab 01/21/13 1011  TROPONINI <0.30   BNP: BNP (last 3 results)  Recent Labs  01/21/13 1011  PROBNP 911.1*   CBG:  Recent Labs Lab 01/22/13 1035  GLUCAP 90    Signed:  Leeta Grimme K  [facility] 01/27/2013, 10:34 AM

## 2013-01-31 NOTE — Telephone Encounter (Signed)
Patient daughter scheduled appointment for 02/07/13

## 2013-02-07 ENCOUNTER — Ambulatory Visit (INDEPENDENT_AMBULATORY_CARE_PROVIDER_SITE_OTHER): Payer: Medicare Other | Admitting: Family

## 2013-02-07 VITALS — BP 118/70 | HR 82 | Temp 98.0°F | Resp 16 | Ht 60.0 in | Wt 116.0 lb

## 2013-02-07 DIAGNOSIS — K529 Noninfective gastroenteritis and colitis, unspecified: Secondary | ICD-10-CM

## 2013-02-07 DIAGNOSIS — K5289 Other specified noninfective gastroenteritis and colitis: Secondary | ICD-10-CM | POA: Diagnosis not present

## 2013-02-07 DIAGNOSIS — D649 Anemia, unspecified: Secondary | ICD-10-CM | POA: Diagnosis not present

## 2013-02-07 DIAGNOSIS — K5100 Ulcerative (chronic) pancolitis without complications: Secondary | ICD-10-CM

## 2013-02-07 DIAGNOSIS — E8809 Other disorders of plasma-protein metabolism, not elsewhere classified: Secondary | ICD-10-CM | POA: Diagnosis not present

## 2013-02-07 LAB — HEPATIC FUNCTION PANEL
ALT: <8 U/L (ref 0–35)
AST: 15 U/L (ref 0–37)
Albumin: 3.5 g/dL (ref 3.5–5.2)
Alkaline Phosphatase: 66 U/L (ref 39–117)
Bilirubin, Direct: <0.1 mg/dL (ref 0.0–0.3)
Total Bilirubin: 0.2 mg/dL — ABNORMAL LOW (ref 0.3–1.2)
Total Protein: 5.9 g/dL — ABNORMAL LOW (ref 6.0–8.3)

## 2013-02-07 LAB — CBC WITH DIFFERENTIAL/PLATELET
Basophils Absolute: 0 10*3/uL (ref 0.0–0.1)
Basophils Relative: 1 % (ref 0–1)
Eosinophils Absolute: 0.1 10*3/uL (ref 0.0–0.7)
Eosinophils Relative: 3 % (ref 0–5)
HCT: 34.4 % — ABNORMAL LOW (ref 36.0–46.0)
Hemoglobin: 11.4 g/dL — ABNORMAL LOW (ref 12.0–15.0)
Lymphocytes Relative: 25 % (ref 12–46)
Lymphs Abs: 1 10*3/uL (ref 0.7–4.0)
MCH: 29.6 pg (ref 26.0–34.0)
MCHC: 33.1 g/dL (ref 30.0–36.0)
MCV: 89.4 fL (ref 78.0–100.0)
Monocytes Absolute: 0.5 10*3/uL (ref 0.1–1.0)
Monocytes Relative: 11 % (ref 3–12)
Neutro Abs: 2.4 10*3/uL (ref 1.7–7.7)
Neutrophils Relative %: 60 % (ref 43–77)
Platelets: 311 10*3/uL (ref 150–400)
RBC: 3.85 MIL/uL — ABNORMAL LOW (ref 3.87–5.11)
RDW: 14.3 % (ref 11.5–15.5)
WBC: 4 10*3/uL (ref 4.0–10.5)

## 2013-02-07 LAB — BASIC METABOLIC PANEL WITH GFR
BUN: 15 mg/dL (ref 6–23)
CO2: 31 mEq/L (ref 19–32)
Calcium: 8.7 mg/dL (ref 8.4–10.5)
Chloride: 102 mEq/L (ref 96–112)
Creat: 0.65 mg/dL (ref 0.50–1.10)
GFR, Est African American: >89 mL/min
GFR, Est Non African American: 81 mL/min
Glucose, Bld: 82 mg/dL (ref 70–99)
Potassium: 4.1 mEq/L (ref 3.5–5.3)
Sodium: 140 mEq/L (ref 135–145)

## 2013-02-07 NOTE — Assessment & Plan Note (Signed)
Clinically resolved.  Advised pt to follow up with Dr. Amedeo Plenty.

## 2013-02-07 NOTE — Assessment & Plan Note (Signed)
Check follow up cbc. 

## 2013-02-07 NOTE — Patient Instructions (Signed)
Please complete your lab work prior to leaving. Follow up in 1 month. Keep your upcoming appointment with Dr. Amedeo Plenty.

## 2013-02-07 NOTE — Progress Notes (Signed)
Subjective:    Patient ID: [name] [name], female    DOB: [birth year], 80 y.o.   MRN: [id number]  HPI  [name] [name] is an 80 yr old female who presents today for hospital follow up.  She was admitted 01/21/13-01/27/13 for pan colitis. Symptoms started with abdominal pain, and vomiting on the night prior to admission. She was treated with cipro and flagy. GI was consulted- dr. Amedeo Plenty.  Had flex sig with normal biopsies.    Review of labs noted mild anemia, mild neutropenia and plt count os 73 K. She was also noted to be hypocalcemic with serum calcium of 7.1.   Today pt reports poor energy. Notes intermittent constipation.  Using lactulose to help with BM's PRN. She denies fever since returning home.  She will meet with Dr. Amedeo Plenty this Wednesday.     Review of Systems    see HPI  Past Medical History  Diagnosis Date  . Osteoporosis   . Arthritis     osteoarthritis  . GERD (gastroesophageal reflux disease)   . Hiatal hernia   . PUD (peptic ulcer disease)   . Cardiac arrhythmia   . Hyperlipidemia   . Heart failure   . Hypertension   . Scoliosis deformity of spine     history of intractable back pain  . Choledocholithiasis   . Pancolitis     Infectious vs. inflammatory  . Abdominal pain     Resolved  . Chronic anemia   . Chronic back pain   . Pancreatitis     History of  . CHF (congestive heart failure)   . COPD (chronic obstructive pulmonary disease)     oxygen dependent at  night 2LPM    History   Social History  . Marital Status: Widowed    Spouse Name: N/A    Number of Children: 3  . Years of Education: N/A   Occupational History  .     Social History Main Topics  . Smoking status: Former Smoker -- 1.00 packs/day for 12 years    Types: Cigarettes    Quit date: 01/28/1988  . Smokeless tobacco: Never Used  . Alcohol Use: No  . Drug Use: No  . Sexual Activity: No   Other Topics Concern  . Not on file   Social History Narrative   1 grandchild at care  link--Angelia   Lives with great-granddaughter    Past Surgical History  Procedure Laterality Date  . Cholecystectomy  2008  . Abdominal hysterectomy  1970s  . Spine surgery  [phone]  . Neck surgery  1970s  . Spinal cord stimulator implant    . Ercp w/ sphicterotomy  02/2010  . Back surgery      x 5  . Pain pump implantation  2009    back, dilaudid and bupravacaine  . Flexible sigmoidoscopy N/A 01/24/2013    Procedure: FLEXIBLE SIGMOIDOSCOPY;  Surgeon: Missy Sabins, MD;  Location: [facility];  Service: Endoscopy;  Laterality: N/A;    Family History  Problem Relation Age of Onset  . Cancer Mother     uterine  . Heart disease Father   . Hypertension Other     Allergies  Allergen Reactions  . Codeine     REACTION: n/v/d, HA  . Morphine     REACTION: n/v/d, HA  . Sulfa Antibiotics   . Zoledronic Acid     REACTION: Severe edema    Current Outpatient Prescriptions on File Prior to Visit  Medication Sig Dispense  Refill  . albuterol (PROVENTIL HFA;VENTOLIN HFA) 108 (90 BASE) MCG/ACT inhaler Inhale 1-2 puffs into the lungs 3 (three) times daily as needed for wheezing or shortness of breath.      . Ascorbic Acid (VITAMIN C) 500 MG tablet Take 500 mg by mouth daily.        . budesonide-formoterol (SYMBICORT) 160-4.5 MCG/ACT inhaler Inhale 2 puffs into the lungs 2 (two) times daily.  1 Inhaler  11  . butalbital-acetaminophen-caffeine (FIORICET, ESGIC) 50-325-40 MG per tablet Take 1 tablet by mouth every 8 (eight) hours as needed for headache.      . Calcium Carbonate-Vitamin D (CALCIUM 600-D) 600-400 MG-UNIT per tablet Take 1 tablet by mouth 2 (two) times daily with a meal.        . ciprofloxacin (CIPRO) 500 MG tablet Take 1 tablet (500 mg total) by mouth 2 (two) times daily.  4 tablet  0  . cyclobenzaprine (FLEXERIL) 10 MG tablet Take 1 tablet by mouth 3 (three) times daily as needed for muscle spasms.       . diclofenac sodium (VOLTAREN) 1 % GEL Apply 1 application  topically 4 (four) times daily as needed. Inflammation  100 g  2  . ferrous fumarate-iron polysaccharide complex (TANDEM) 162-115.2 MG CAPS Take 1 capsule by mouth daily.        . fluticasone (FLONASE) 50 MCG/ACT nasal spray Place 2 sprays into both nostrils daily.       [name] gabapentin (NEURONTIN) 100 MG capsule Take 100 mg by mouth 2 (two) times daily.       [name] HYDROmorphone (DILAUDID) 4 MG tablet Take 4 mg by mouth 2 (two) times daily as needed for moderate pain or severe pain.      . hyoscyamine (ANASPAZ) 0.125 MG TBDP Place 0.125 mg under the tongue daily.      . Lactulose SOLN Take 2 mLs by mouth at bedtime as needed (for constipation).  1 Bottle  0  . nitroGLYCERIN (NITROSTAT) 0.4 MG SL tablet Place 1 tablet (0.4 mg total) under the tongue every 5 (five) minutes as needed for chest pain.  100 tablet  3  . omeprazole (PRILOSEC) 40 MG capsule Take 40 mg by mouth 2 (two) times daily.      [name] oxyCODONE-acetaminophen (PERCOCET/ROXICET) 5-325 MG per tablet Take 1 tablet by mouth every 4 (four) hours as needed for moderate pain or severe pain.      . polyethylene glycol (MIRALAX / GLYCOLAX) packet Take 17 g by mouth daily as needed for mild constipation or moderate constipation.  14 each  0  . promethazine (PHENERGAN) 25 MG tablet Take 1 tablet (25 mg total) by mouth as needed. Nausea  30 tablet  0  . Vitamin D, Ergocalciferol, (DRISDOL) 50000 UNITS CAPS Take 50,000 Units by mouth every 7 (seven) days.       No current facility-administered medications on file prior to visit.    BP 118/70  Pulse 82  Temp(Src) 98 F (36.7 C) (Oral)  Resp 16  Ht 5' (1.524 m)  Wt 116 lb 0.6 oz (52.635 kg)  BMI 22.66 kg/m2  SpO2 94%    Objective:   Physical Exam  Constitutional: She is oriented to person, place, and time. She appears well-developed and well-nourished. No distress.  Cardiovascular: Normal rate and regular rhythm.   No murmur heard. Pulmonary/Chest: Effort normal and breath sounds normal. No  respiratory distress. She has no wheezes. She has no rales. She exhibits no tenderness.  Abdominal: Soft.  Bowel sounds are normal.  Pain pump palpable in right lateral abdomen. Mild diffuse abdominal tenderness.  Neurological: She is alert and oriented to person, place, and time.  Psychiatric: She has a normal mood and affect. Her behavior is normal. Judgment and thought content normal.          Assessment & Plan:

## 2013-02-07 NOTE — Progress Notes (Signed)
Pre visit review using our clinic review tool, if applicable. No additional management support is needed unless otherwise documented below in the visit note. 

## 2013-02-07 NOTE — Assessment & Plan Note (Signed)
Obtain follow up BMET.

## 2013-02-09 ENCOUNTER — Encounter: Payer: Self-pay | Admitting: Family

## 2013-02-09 DIAGNOSIS — R109 Unspecified abdominal pain: Secondary | ICD-10-CM | POA: Diagnosis not present

## 2013-02-09 DIAGNOSIS — R933 Abnormal findings on diagnostic imaging of other parts of digestive tract: Secondary | ICD-10-CM | POA: Diagnosis not present

## 2013-02-09 DIAGNOSIS — R112 Nausea with vomiting, unspecified: Secondary | ICD-10-CM | POA: Diagnosis not present

## 2013-02-09 DIAGNOSIS — K5900 Constipation, unspecified: Secondary | ICD-10-CM | POA: Diagnosis not present

## 2013-02-23 DIAGNOSIS — R51 Headache: Secondary | ICD-10-CM | POA: Diagnosis not present

## 2013-02-23 DIAGNOSIS — Z79899 Other long term (current) drug therapy: Secondary | ICD-10-CM | POA: Diagnosis not present

## 2013-02-23 DIAGNOSIS — M545 Low back pain, unspecified: Secondary | ICD-10-CM | POA: Diagnosis not present

## 2013-02-23 DIAGNOSIS — M5137 Other intervertebral disc degeneration, lumbosacral region: Secondary | ICD-10-CM | POA: Diagnosis not present

## 2013-02-23 DIAGNOSIS — Z9889 Other specified postprocedural states: Secondary | ICD-10-CM | POA: Diagnosis not present

## 2013-03-02 ENCOUNTER — Telehealth: Payer: Self-pay | Admitting: *Deleted

## 2013-03-02 MED ORDER — LACTULOSE SOLN: 20.0000 mL | Freq: Every evening | Status: DC | PRN

## 2013-03-02 NOTE — Telephone Encounter (Signed)
Received call from pt requesting refill of lactulose that was prescribed in the hospital for constipation. Pt states she has to take this every night.  Please advise if ok to send refill?  If so, pt prefers [facility].

## 2013-03-02 NOTE — Telephone Encounter (Signed)
Pt already taking miralax.  Please advise.

## 2013-03-02 NOTE — Telephone Encounter (Signed)
For long term use, I would recommend miralax 1 cap in water or juice once daily as needed rather than lactulose.

## 2013-03-02 NOTE — Telephone Encounter (Signed)
Refill sent. Notified pt. 

## 2013-03-02 NOTE — Telephone Encounter (Signed)
OK to send refill on lactulose. Sig should read 90ml PO HS PRN constipation.

## 2013-03-03 ENCOUNTER — Other Ambulatory Visit: Payer: Self-pay | Admitting: Family

## 2013-03-10 ENCOUNTER — Telehealth: Payer: Self-pay | Admitting: Family

## 2013-03-10 NOTE — Telephone Encounter (Signed)
When she was discharged from the hospital she was given lactulose and told to take 7ml by mouth at bedtime.  When she called our office and got the rx called in to her pharmacy it says take 2 ml at bedtime.

## 2013-03-11 MED ORDER — LACTULOSE SOLN: 10.0000 mL | Freq: Every evening | Status: DC | PRN

## 2013-03-11 NOTE — Telephone Encounter (Signed)
OK to take 10 mls  [facility].

## 2013-03-11 NOTE — Telephone Encounter (Signed)
Refill sent, pt notified

## 2013-03-11 NOTE — Telephone Encounter (Signed)
Please advise 

## 2013-03-26 ENCOUNTER — Other Ambulatory Visit: Payer: Self-pay | Admitting: Family

## 2013-03-30 ENCOUNTER — Encounter: Payer: Self-pay | Admitting: Family

## 2013-03-30 ENCOUNTER — Ambulatory Visit: Payer: Medicare Other | Admitting: Family

## 2013-03-30 ENCOUNTER — Ambulatory Visit (INDEPENDENT_AMBULATORY_CARE_PROVIDER_SITE_OTHER): Payer: Medicare Other | Admitting: Family

## 2013-03-30 VITALS — BP 100/70 | HR 77 | Temp 98.0°F | Resp 16 | Ht 60.0 in | Wt 117.0 lb

## 2013-03-30 DIAGNOSIS — J438 Other emphysema: Secondary | ICD-10-CM | POA: Diagnosis not present

## 2013-03-30 DIAGNOSIS — R413 Other amnesia: Secondary | ICD-10-CM | POA: Diagnosis not present

## 2013-03-30 DIAGNOSIS — D649 Anemia, unspecified: Secondary | ICD-10-CM

## 2013-03-30 DIAGNOSIS — J439 Emphysema, unspecified: Secondary | ICD-10-CM

## 2013-03-30 LAB — CBC WITH DIFFERENTIAL/PLATELET
Basophils Absolute: 0 10*3/uL (ref 0.0–0.1)
Basophils Relative: 1 % (ref 0–1)
Eosinophils Absolute: 0.2 10*3/uL (ref 0.0–0.7)
Eosinophils Relative: 5 % (ref 0–5)
HCT: 36.7 % (ref 36.0–46.0)
Hemoglobin: 11.9 g/dL — ABNORMAL LOW (ref 12.0–15.0)
Lymphocytes Relative: 32 % (ref 12–46)
Lymphs Abs: 1.2 10*3/uL (ref 0.7–4.0)
MCH: 29.5 pg (ref 26.0–34.0)
MCHC: 32.4 g/dL (ref 30.0–36.0)
MCV: 91.1 fL (ref 78.0–100.0)
Monocytes Absolute: 0.3 10*3/uL (ref 0.1–1.0)
Monocytes Relative: 9 % (ref 3–12)
Neutro Abs: 2 10*3/uL (ref 1.7–7.7)
Neutrophils Relative %: 53 % (ref 43–77)
Platelets: 157 10*3/uL (ref 150–400)
RBC: 4.03 MIL/uL (ref 3.87–5.11)
RDW: 14.8 % (ref 11.5–15.5)
WBC: 3.7 10*3/uL — ABNORMAL LOW (ref 4.0–10.5)

## 2013-03-30 MED ORDER — LACTULOSE 10 GM/15ML PO SOLN: ORAL | Status: DC

## 2013-03-30 NOTE — Progress Notes (Signed)
Subjective:    Patient ID: [name] [name], female    DOB: 27-Aug-1925, 80 y.o.   MRN: [id number]  HPI  [name] [name] is an 80 yr old female who presents today for follow up.  1) Memory loss- declined referral to neurology last visit.  Daughter reports memory is stable.     2) Anemia- was heme + in the hospital. Colo was attempted in December but was unsuccessful due to poor prep.  Reports that is taking goodie powder for pain.  She is on dilaudid per [place] [facility].   3) COPD-  Some wheezing.  + sob with walking distances.     Review of Systems See HPI  Past Medical History  Diagnosis Date  . Osteoporosis   . Arthritis     osteoarthritis  . GERD (gastroesophageal reflux disease)   . Hiatal hernia   . PUD (peptic ulcer disease)   . Cardiac arrhythmia   . Hyperlipidemia   . Heart failure   . Hypertension   . Scoliosis deformity of spine     history of intractable back pain  . Choledocholithiasis   . Pancolitis     Infectious vs. inflammatory  . Abdominal pain     Resolved  . Chronic anemia   . Chronic back pain   . Pancreatitis     History of  . CHF (congestive heart failure)   . COPD (chronic obstructive pulmonary disease)     oxygen dependent at  night 2LPM    History   Social History  . Marital Status: Widowed    Spouse Name: N/A    Number of Children: 3  . Years of Education: N/A   Occupational History  .     Social History Main Topics  . Smoking status: Former Smoker -- 1.00 packs/day for 12 years    Types: Cigarettes    Quit date: 01/28/1988  . Smokeless tobacco: Never Used  . Alcohol Use: No  . Drug Use: No  . Sexual Activity: No   Other Topics Concern  . Not on file   Social History Narrative   1 grandchild at care link--Angelia   Lives with great-granddaughter    Past Surgical History  Procedure Laterality Date  . Cholecystectomy  2008  . Abdominal hysterectomy  1970s  . Spine surgery  [phone]  . Neck surgery  1970s  .  Spinal cord stimulator implant    . Ercp w/ sphicterotomy  02/2010  . Back surgery      x 5  . Pain pump implantation  2009    back, dilaudid and bupravacaine  . Flexible sigmoidoscopy N/A 01/24/2013    Procedure: FLEXIBLE SIGMOIDOSCOPY;  Surgeon: Missy Sabins, MD;  Location: [place];  Service: Endoscopy;  Laterality: N/A;    Family History  Problem Relation Age of Onset  . Cancer Mother     uterine  . Heart disease Father   . Hypertension Other     Allergies  Allergen Reactions  . Codeine     REACTION: n/v/d, HA  . Morphine     REACTION: n/v/d, HA  . Sulfa Antibiotics   . Zoledronic Acid     REACTION: Severe edema    Current Outpatient Prescriptions on File Prior to Visit  Medication Sig Dispense Refill  . albuterol (PROVENTIL HFA;VENTOLIN HFA) 108 (90 BASE) MCG/ACT inhaler Inhale 1-2 puffs into the lungs 3 (three) times daily as needed for wheezing or shortness of breath.      [name]  Ascorbic Acid (VITAMIN C) 500 MG tablet Take 500 mg by mouth daily.        . budesonide-formoterol (SYMBICORT) 160-4.5 MCG/ACT inhaler Inhale 2 puffs into the lungs 2 (two) times daily.  1 Inhaler  11  . butalbital-acetaminophen-caffeine (FIORICET, ESGIC) 50-325-40 MG per tablet Take 1 tablet by mouth every 8 (eight) hours as needed for headache.      . Calcium Carbonate-Vitamin D (CALCIUM 600-D) 600-400 MG-UNIT per tablet Take 1 tablet by mouth 2 (two) times daily with a meal.        . ciprofloxacin (CIPRO) 500 MG tablet Take 1 tablet (500 mg total) by mouth 2 (two) times daily.  4 tablet  0  . cyclobenzaprine (FLEXERIL) 10 MG tablet Take 1 tablet by mouth 3 (three) times daily as needed for muscle spasms.       . diclofenac sodium (VOLTAREN) 1 % GEL Apply 1 application topically 4 (four) times daily as needed. Inflammation  100 g  2  . ferrous fumarate-iron polysaccharide complex (TANDEM) 162-115.2 MG CAPS Take 1 capsule by mouth daily.        . fluticasone (FLONASE) 50 MCG/ACT nasal spray  Place 2 sprays into both nostrils daily.       [name] gabapentin (NEURONTIN) 100 MG capsule Take 100 mg by mouth 2 (two) times daily.       [name] HYDROmorphone (DILAUDID) 4 MG tablet Take 4 mg by mouth 2 (two) times daily as needed for moderate pain or severe pain.      . hyoscyamine (ANASPAZ) 0.125 MG TBDP Place 0.125 mg under the tongue daily.      . Lactulose SOLN Take 10 mLs by mouth at bedtime as needed (for constipation).  100 mL  2  . nitroGLYCERIN (NITROSTAT) 0.4 MG SL tablet Place 1 tablet (0.4 mg total) under the tongue every 5 (five) minutes as needed for chest pain.  100 tablet  3  . omeprazole (PRILOSEC) 40 MG capsule TAKE 1 CAPSULE BY MOUTH 2 TIMES DAILY  60 capsule  3  . oxyCODONE-acetaminophen (PERCOCET/ROXICET) 5-325 MG per tablet Take 1 tablet by mouth every 4 (four) hours as needed for moderate pain or severe pain.      . polyethylene glycol (MIRALAX / GLYCOLAX) packet Take 17 g by mouth daily as needed for mild constipation or moderate constipation.  14 each  0  . promethazine (PHENERGAN) 25 MG tablet Take 1 tablet (25 mg total) by mouth as needed. Nausea  30 tablet  0  . Vitamin D, Ergocalciferol, (DRISDOL) 50000 UNITS CAPS Take 50,000 Units by mouth every 7 (seven) days.       No current facility-administered medications on file prior to visit.    BP 100/70  Pulse 77  Temp(Src) 98 F (36.7 C) (Oral)  Resp 16  Ht 5' (1.524 m)  Wt 117 lb (53.071 kg)  BMI 22.85 kg/m2  SpO2 94%       Objective:   Physical Exam  Constitutional: She is oriented to person, place, and time. She appears well-developed and well-nourished. No distress.  HENT:  Head: Normocephalic and atraumatic.  Cardiovascular: Normal rate and regular rhythm.   No murmur heard. Pulmonary/Chest: Effort normal and breath sounds normal. No respiratory distress. She has no wheezes. She has no rales. She exhibits no tenderness.  Musculoskeletal: She exhibits no edema.  Neurological: She is alert and oriented to  person, place, and time.  Psychiatric: She has a normal mood and affect. Her behavior is normal.  Judgment and thought content normal.          Assessment & Plan:

## 2013-03-30 NOTE — Progress Notes (Signed)
Pre visit review using our clinic review tool, if applicable. No additional management support is needed unless otherwise documented below in the visit note. 

## 2013-03-30 NOTE — Assessment & Plan Note (Signed)
Stable at baseline. Continue albuterol, symbicort.

## 2013-03-30 NOTE — Patient Instructions (Signed)
Please complete lab work prior to leaving. Stop bc powders. Continue prilosec. Keep your upcoming appointment with pain management so you can discuss additional pain medication options.  Follow up in 3 months.

## 2013-03-30 NOTE — Assessment & Plan Note (Signed)
Pt with hx of heme+ stool.  Check follow up CBC.  Likely secondary to chronic goodie use.  Advised pt to stop goodie powder, follow up with pain management. Declines repeat colo (we did discuss possibility of underlying malignancy but she still declines). Cont PPI for GI prophylaxis.

## 2013-03-30 NOTE — Assessment & Plan Note (Signed)
Stable, monitor.  °

## 2013-04-02 ENCOUNTER — Encounter: Payer: Self-pay | Admitting: Family

## 2013-04-05 ENCOUNTER — Ambulatory Visit: Payer: Medicare Other | Admitting: Family

## 2013-04-13 DIAGNOSIS — G894 Chronic pain syndrome: Secondary | ICD-10-CM | POA: Diagnosis not present

## 2013-04-13 DIAGNOSIS — Z79899 Other long term (current) drug therapy: Secondary | ICD-10-CM | POA: Diagnosis not present

## 2013-04-13 DIAGNOSIS — M51379 Other intervertebral disc degeneration, lumbosacral region without mention of lumbar back pain or lower extremity pain: Secondary | ICD-10-CM | POA: Diagnosis not present

## 2013-04-13 DIAGNOSIS — R51 Headache: Secondary | ICD-10-CM | POA: Diagnosis not present

## 2013-04-13 DIAGNOSIS — M545 Low back pain, unspecified: Secondary | ICD-10-CM | POA: Diagnosis not present

## 2013-04-13 DIAGNOSIS — Z9889 Other specified postprocedural states: Secondary | ICD-10-CM | POA: Diagnosis not present

## 2013-04-13 DIAGNOSIS — M5137 Other intervertebral disc degeneration, lumbosacral region: Secondary | ICD-10-CM | POA: Diagnosis not present

## 2013-05-11 DIAGNOSIS — R112 Nausea with vomiting, unspecified: Secondary | ICD-10-CM | POA: Diagnosis not present

## 2013-05-11 DIAGNOSIS — K5900 Constipation, unspecified: Secondary | ICD-10-CM | POA: Diagnosis not present

## 2013-05-11 DIAGNOSIS — R109 Unspecified abdominal pain: Secondary | ICD-10-CM | POA: Diagnosis not present

## 2013-05-30 DIAGNOSIS — M5137 Other intervertebral disc degeneration, lumbosacral region: Secondary | ICD-10-CM | POA: Diagnosis not present

## 2013-05-30 DIAGNOSIS — G894 Chronic pain syndrome: Secondary | ICD-10-CM | POA: Diagnosis not present

## 2013-05-30 DIAGNOSIS — Z79899 Other long term (current) drug therapy: Secondary | ICD-10-CM | POA: Diagnosis not present

## 2013-05-30 DIAGNOSIS — M545 Low back pain, unspecified: Secondary | ICD-10-CM | POA: Diagnosis not present

## 2013-05-30 DIAGNOSIS — Z9889 Other specified postprocedural states: Secondary | ICD-10-CM | POA: Diagnosis not present

## 2013-06-23 ENCOUNTER — Ambulatory Visit (INDEPENDENT_AMBULATORY_CARE_PROVIDER_SITE_OTHER): Payer: Medicare Other

## 2013-06-23 DIAGNOSIS — Z2911 Encounter for prophylactic immunotherapy for respiratory syncytial virus (RSV): Secondary | ICD-10-CM | POA: Diagnosis not present

## 2013-06-23 DIAGNOSIS — Z23 Encounter for immunization: Secondary | ICD-10-CM | POA: Diagnosis not present

## 2013-06-23 NOTE — Progress Notes (Signed)
   Subjective:    Patient ID: [name] [name], female    DOB: 1925/08/18, 80 y.o.   MRN: [id number]  HPI    Review of Systems     Objective:   Physical Exam        Assessment & Plan:  Pt came in with her daughter to get her Zostavax injection. pts daughter stated that [facility] told them it would be $ 200 (and some odd dollar) to get it there and free at doctors.  Pt tolerated injection well

## 2013-07-05 ENCOUNTER — Ambulatory Visit: Payer: Medicare Other | Admitting: Family

## 2013-07-18 DIAGNOSIS — Z79899 Other long term (current) drug therapy: Secondary | ICD-10-CM | POA: Diagnosis not present

## 2013-07-18 DIAGNOSIS — Z9889 Other specified postprocedural states: Secondary | ICD-10-CM | POA: Diagnosis not present

## 2013-07-18 DIAGNOSIS — M545 Low back pain, unspecified: Secondary | ICD-10-CM | POA: Diagnosis not present

## 2013-07-18 DIAGNOSIS — M5137 Other intervertebral disc degeneration, lumbosacral region: Secondary | ICD-10-CM | POA: Diagnosis not present

## 2013-08-08 ENCOUNTER — Ambulatory Visit (INDEPENDENT_AMBULATORY_CARE_PROVIDER_SITE_OTHER): Payer: Medicare Other | Admitting: Family

## 2013-08-08 ENCOUNTER — Encounter: Payer: Self-pay | Admitting: Family

## 2013-08-08 VITALS — BP 110/70 | HR 80 | Temp 98.3°F | Resp 18 | Ht 60.0 in | Wt 115.0 lb

## 2013-08-08 DIAGNOSIS — R195 Other fecal abnormalities: Secondary | ICD-10-CM

## 2013-08-08 DIAGNOSIS — R51 Headache: Secondary | ICD-10-CM | POA: Diagnosis not present

## 2013-08-08 DIAGNOSIS — R413 Other amnesia: Secondary | ICD-10-CM | POA: Diagnosis not present

## 2013-08-08 DIAGNOSIS — M1990 Unspecified osteoarthritis, unspecified site: Secondary | ICD-10-CM

## 2013-08-08 DIAGNOSIS — D649 Anemia, unspecified: Secondary | ICD-10-CM

## 2013-08-08 NOTE — Progress Notes (Signed)
Pre visit review using our clinic review tool, if applicable. No additional management support is needed unless otherwise documented below in the visit note. 

## 2013-08-08 NOTE — Assessment & Plan Note (Addendum)
Continuing Goodies powders daily. Says Goodies powders are the only thing that helps her headache. Taking PPI. Advised to stop taking Goodies powders. Hemoglobin remains stable.  Lab Results  Component Value Date   WBC 4.4 08/08/2013   HGB 12.4 08/08/2013   HCT 36.7 08/08/2013   MCV 89.5 08/08/2013   PLT 211 08/08/2013

## 2013-08-08 NOTE — Progress Notes (Signed)
Subjective:    Patient ID: [name] [name], female    DOB: 1925/02/27, 80 y.o.   MRN: [id number]  HPI [name] [name] is an 80 yo female here for follow up.    Anemia: Pt with hx of heme+ stool. Colo was attempted in December but was unsuccessful due to poor prep. She continues to refuse repeat attempt at colonoscopy.Taking PPI.   Headaches: Reports that she continues to use goodie powder for headaches.  Says Goodies powders are the only thing that helps her headache. She is followed by pain management and has rx for percocet.    Memory loss- per pt's daughter memory stable according to patient and daughter. She refused neurology referral last visit.    Worries about falling but denies dizziness or lightheadedness.  Uses cane for assistance with ambulation.  Wt Readings from Last 3 Encounters:  08/08/13 115 lb 0.6 oz (52.182 kg)  03/30/13 117 lb (53.071 kg)  02/07/13 116 lb 0.6 oz (52.635 kg)   OA- Complains of pain in joints of both hands.  Review of Systems  Respiratory: Positive for shortness of breath (with exertion.).   Cardiovascular: Positive for palpitations (Occasional.). Negative for chest pain and leg swelling.  Gastrointestinal: Negative for blood in stool.  Genitourinary: Negative for hematuria.   Past Medical History  Diagnosis Date  . Osteoporosis   . Arthritis     osteoarthritis  . GERD (gastroesophageal reflux disease)   . Hiatal hernia   . PUD (peptic ulcer disease)   . Cardiac arrhythmia   . Hyperlipidemia   . Heart failure   . Hypertension   . Scoliosis deformity of spine     history of intractable back pain  . Choledocholithiasis   . Pancolitis     Infectious vs. inflammatory  . Abdominal pain     Resolved  . Chronic anemia   . Chronic back pain   . Pancreatitis     History of  . CHF (congestive heart failure)   . COPD (chronic obstructive pulmonary disease)     oxygen dependent at  night 2LPM    History   Social History  . Marital Status:  Widowed    Spouse Name: N/A    Number of Children: 3  . Years of Education: N/A   Occupational History  .     Social History Main Topics  . Smoking status: Former Smoker -- 1.00 packs/day for 12 years    Types: Cigarettes    Quit date: 01/28/1988  . Smokeless tobacco: Never Used  . Alcohol Use: No  . Drug Use: No  . Sexual Activity: No   Other Topics Concern  . Not on file   Social History Narrative   1 grandchild at care link--Angelia   Lives with great-granddaughter    Past Surgical History  Procedure Laterality Date  . Cholecystectomy  2008  . Abdominal hysterectomy  1970s  . Spine surgery  [phone]  . Neck surgery  1970s  . Spinal cord stimulator implant    . Ercp w/ sphicterotomy  02/2010  . Back surgery      x 5  . Pain pump implantation  2009    back, dilaudid and bupravacaine  . Flexible sigmoidoscopy N/A 01/24/2013    Procedure: FLEXIBLE SIGMOIDOSCOPY;  Surgeon: Missy Sabins, MD;  Location: [place];  Service: Endoscopy;  Laterality: N/A;    Family History  Problem Relation Age of Onset  . Cancer Mother     uterine  .  Heart disease Father   . Hypertension Other     Allergies  Allergen Reactions  . Codeine     REACTION: n/v/d, HA  . Morphine     REACTION: n/v/d, HA  . Sulfa Antibiotics   . Zoledronic Acid     REACTION: Severe edema    Current Outpatient Prescriptions on File Prior to Visit  Medication Sig Dispense Refill  . albuterol (PROVENTIL HFA;VENTOLIN HFA) 108 (90 BASE) MCG/ACT inhaler Inhale 1-2 puffs into the lungs 3 (three) times daily as needed for wheezing or shortness of breath.      . Ascorbic Acid (VITAMIN C) 500 MG tablet Take 500 mg by mouth daily.        . budesonide-formoterol (SYMBICORT) 160-4.5 MCG/ACT inhaler Inhale 2 puffs into the lungs 2 (two) times daily.  1 Inhaler  11  . butalbital-acetaminophen-caffeine (FIORICET, ESGIC) 50-325-40 MG per tablet Take 1 tablet by mouth every 8 (eight) hours as needed for  headache.      . Calcium Carbonate-Vitamin D (CALCIUM 600-D) 600-400 MG-UNIT per tablet Take 1 tablet by mouth 2 (two) times daily with a meal.        . cyclobenzaprine (FLEXERIL) 10 MG tablet Take 1 tablet by mouth 3 (three) times daily as needed for muscle spasms.       . diclofenac sodium (VOLTAREN) 1 % GEL Apply 1 application topically 4 (four) times daily as needed. Inflammation  100 g  2  . ferrous fumarate-iron polysaccharide complex (TANDEM) 162-115.2 MG CAPS Take 1 capsule by mouth daily.        . fluticasone (FLONASE) 50 MCG/ACT nasal spray Place 2 sprays into both nostrils daily.       [name] gabapentin (NEURONTIN) 100 MG capsule Take 100 mg by mouth 2 (two) times daily.       [name] HYDROmorphone (DILAUDID) 4 MG tablet Take 4 mg by mouth 2 (two) times daily as needed for moderate pain or severe pain.      . hyoscyamine (ANASPAZ) 0.125 MG TBDP Place 0.125 mg under the tongue daily.      [name] lactulose (CHRONULAC) 10 GM/15ML solution 10 ml by mouth once daily at bedtime  500 mL  2  . Lactulose SOLN Take 10 mLs by mouth at bedtime as needed (for constipation).  100 mL  2  . nitroGLYCERIN (NITROSTAT) 0.4 MG SL tablet Place 1 tablet (0.4 mg total) under the tongue every 5 (five) minutes as needed for chest pain.  100 tablet  3  . omeprazole (PRILOSEC) 40 MG capsule TAKE 1 CAPSULE BY MOUTH 2 TIMES DAILY  60 capsule  3  . oxyCODONE-acetaminophen (PERCOCET/ROXICET) 5-325 MG per tablet Take 1 tablet by mouth every 4 (four) hours as needed for moderate pain or severe pain.      . polyethylene glycol (MIRALAX / GLYCOLAX) packet Take 17 g by mouth daily as needed for mild constipation or moderate constipation.  14 each  0  . promethazine (PHENERGAN) 25 MG tablet Take 1 tablet (25 mg total) by mouth as needed. Nausea  30 tablet  0  . Vitamin D, Ergocalciferol, (DRISDOL) 50000 UNITS CAPS Take 50,000 Units by mouth every 7 (seven) days.       No current facility-administered medications on file prior to visit.     BP 110/70  Pulse 80  Temp(Src) 98.3 F (36.8 C) (Oral)  Resp 18  Ht 5' (1.524 m)  Wt 115 lb 0.6 oz (52.182 kg)  BMI 22.47 kg/m2  SpO2  90%       Objective:   Physical Exam  Constitutional: She is oriented to person, place, and time. No distress.  Thin.  HENT:  Head: Normocephalic and atraumatic.  Mouth/Throat: No oropharyngeal exudate.  Eyes: Pupils are equal, round, and reactive to light.  Conjunctiva pale.  Cardiovascular: Normal rate, regular rhythm and normal heart sounds.   Pulmonary/Chest: Effort normal. She has rales (Diffuse).  Abdominal: Soft. She exhibits no distension and no mass. There is no tenderness. There is no guarding.  Musculoskeletal: She exhibits edema and tenderness.  MCL and PIP joints bilateral hands enlarged and painful.  Lymphadenopathy:    She has no cervical adenopathy.  Neurological: She is alert and oriented to person, place, and time.  Skin: Skin is warm and dry. No rash noted. She is not diaphoretic. No erythema.  Psychiatric: She has a normal mood and affect. Her behavior is normal.          Assessment & Plan:  Patient seen by [facility] NP-student.  I have personally seen and examined patient and agree with Ms. Whitmire's assessment and plan.

## 2013-08-08 NOTE — Patient Instructions (Signed)
Please complete lab work prior to leaving. Follow up in 4 months.  

## 2013-08-08 NOTE — Assessment & Plan Note (Signed)
No change in memory since last visit.

## 2013-08-09 ENCOUNTER — Telehealth: Payer: Self-pay | Admitting: Family

## 2013-08-09 ENCOUNTER — Encounter: Payer: Self-pay | Admitting: Family

## 2013-08-09 LAB — CBC WITH DIFFERENTIAL/PLATELET
Basophils Absolute: 0 10*3/uL (ref 0.0–0.1)
Basophils Relative: 1 % (ref 0–1)
Eosinophils Absolute: 0.2 10*3/uL (ref 0.0–0.7)
Eosinophils Relative: 4 % (ref 0–5)
HCT: 36.7 % (ref 36.0–46.0)
Hemoglobin: 12.4 g/dL (ref 12.0–15.0)
Lymphocytes Relative: 25 % (ref 12–46)
Lymphs Abs: 1.1 10*3/uL (ref 0.7–4.0)
MCH: 30.2 pg (ref 26.0–34.0)
MCHC: 33.8 g/dL (ref 30.0–36.0)
MCV: 89.5 fL (ref 78.0–100.0)
Monocytes Absolute: 0.4 10*3/uL (ref 0.1–1.0)
Monocytes Relative: 9 % (ref 3–12)
Neutro Abs: 2.7 10*3/uL (ref 1.7–7.7)
Neutrophils Relative %: 61 % (ref 43–77)
Platelets: 211 10*3/uL (ref 150–400)
RBC: 4.1 MIL/uL (ref 3.87–5.11)
RDW: 14.7 % (ref 11.5–15.5)
WBC: 4.4 10*3/uL (ref 4.0–10.5)

## 2013-08-09 NOTE — Telephone Encounter (Signed)
Dulcolax added to med list.   Lenna Sciara, please see med reconcilliation from [facility] dated 07/18/13. Lists atenolol and diclofenac tabs which pt's daughter states below that pt is no longer using. She did confirm that pt uses diclofenac gel which is on her med list. Also Osborne Oman has that pt is taking Bystolic but her daughter doesn't think she has it. These may be old but I wanted to get your advice?

## 2013-08-09 NOTE — Telephone Encounter (Signed)
BP Readings from Last 3 Encounters:  08/08/13 110/70  03/30/13 100/70  02/07/13 118/70   BP is stable. What she is doing is working- we just need to confirm for our records.   Please ask pt's daughter to review the list we provided at her visit and compare with pt's home meds to make sure that she is not taking atenolol or bystolic.  Also, their list notes spiriva and symbicort.  Is she taking these inhalers?

## 2013-08-09 NOTE — Telephone Encounter (Signed)
Message was left stating that patient is no longer taking Atenolol, or Diclofenac, patient is still taking Dulcolax OTC but not by prescription (called did not identify themselves on voicemail)

## 2013-08-09 NOTE — Telephone Encounter (Signed)
Spoke with pt. She verified that she does not take Bystolic and only uses symbicort and spiriva as needed ("very little"). Med list updated. Daughter had already verified that pt is not taking atenolol.

## 2013-08-11 DIAGNOSIS — R519 Headache, unspecified: Secondary | ICD-10-CM | POA: Insufficient documentation

## 2013-08-11 DIAGNOSIS — R51 Headache: Secondary | ICD-10-CM | POA: Insufficient documentation

## 2013-08-11 NOTE — Assessment & Plan Note (Signed)
She is on regular percocet.  Avoid ASA containing products.

## 2013-08-11 NOTE — Assessment & Plan Note (Signed)
Unchanged, monitor

## 2013-08-22 ENCOUNTER — Other Ambulatory Visit: Payer: Self-pay | Admitting: Family

## 2013-08-23 MED ORDER — GABAPENTIN 100 MG PO CAPS: 100.0000 mg | ORAL_CAPSULE | Freq: Two times a day (BID) | ORAL | Status: DC

## 2013-08-23 NOTE — Telephone Encounter (Signed)
Received refill request from pharmacy for gabapentin 100mg  three times daily.  Per current med list pt takes 100mg  twice a day. Called pt and verified that she is taking 1 twice a day. Current rx denied and new rx sent with twice daily directions.

## 2013-08-26 ENCOUNTER — Telehealth: Payer: Self-pay | Admitting: *Deleted

## 2013-08-26 MED ORDER — OMEPRAZOLE 40 MG PO CPDR: DELAYED_RELEASE_CAPSULE | ORAL | Status: DC

## 2013-08-26 NOTE — Telephone Encounter (Signed)
Received message from pt to call her re: medication. Spoke with pt's daughter stating pt has requested refill of omeprazole twice and we have not responded. Advised her that I do not see that we have received this request from pharmacy but I will send refill now.

## 2013-09-05 DIAGNOSIS — Z79899 Other long term (current) drug therapy: Secondary | ICD-10-CM | POA: Diagnosis not present

## 2013-09-05 DIAGNOSIS — M545 Low back pain, unspecified: Secondary | ICD-10-CM | POA: Diagnosis not present

## 2013-09-05 DIAGNOSIS — Z9889 Other specified postprocedural states: Secondary | ICD-10-CM | POA: Diagnosis not present

## 2013-09-05 DIAGNOSIS — M5137 Other intervertebral disc degeneration, lumbosacral region: Secondary | ICD-10-CM | POA: Diagnosis not present

## 2013-09-05 DIAGNOSIS — Z5181 Encounter for therapeutic drug level monitoring: Secondary | ICD-10-CM | POA: Diagnosis not present

## 2013-09-05 DIAGNOSIS — M51379 Other intervertebral disc degeneration, lumbosacral region without mention of lumbar back pain or lower extremity pain: Secondary | ICD-10-CM | POA: Diagnosis not present

## 2013-10-20 DIAGNOSIS — M2040 Other hammer toe(s) (acquired), unspecified foot: Secondary | ICD-10-CM | POA: Diagnosis not present

## 2013-10-20 DIAGNOSIS — L84 Corns and callosities: Secondary | ICD-10-CM | POA: Diagnosis not present

## 2013-10-20 DIAGNOSIS — M2010 Hallux valgus (acquired), unspecified foot: Secondary | ICD-10-CM | POA: Diagnosis not present

## 2013-10-24 DIAGNOSIS — M545 Low back pain, unspecified: Secondary | ICD-10-CM | POA: Diagnosis not present

## 2013-10-24 DIAGNOSIS — Z9889 Other specified postprocedural states: Secondary | ICD-10-CM | POA: Diagnosis not present

## 2013-10-24 DIAGNOSIS — G894 Chronic pain syndrome: Secondary | ICD-10-CM | POA: Diagnosis not present

## 2013-10-24 DIAGNOSIS — M5137 Other intervertebral disc degeneration, lumbosacral region: Secondary | ICD-10-CM | POA: Diagnosis not present

## 2013-10-24 DIAGNOSIS — Z79899 Other long term (current) drug therapy: Secondary | ICD-10-CM | POA: Diagnosis not present

## 2013-10-31 ENCOUNTER — Ambulatory Visit (INDEPENDENT_AMBULATORY_CARE_PROVIDER_SITE_OTHER): Payer: Medicare Other | Admitting: Family

## 2013-10-31 ENCOUNTER — Encounter: Payer: Self-pay | Admitting: Family

## 2013-10-31 VITALS — BP 126/62 | HR 80 | Temp 98.0°F | Resp 16 | Ht 60.0 in | Wt 114.8 lb

## 2013-10-31 DIAGNOSIS — Z23 Encounter for immunization: Secondary | ICD-10-CM | POA: Diagnosis not present

## 2013-10-31 DIAGNOSIS — K130 Diseases of lips: Secondary | ICD-10-CM

## 2013-10-31 DIAGNOSIS — L89891 Pressure ulcer of other site, stage 1: Secondary | ICD-10-CM | POA: Diagnosis not present

## 2013-10-31 MED ORDER — CLOTRIMAZOLE 1 % EX OINT: TOPICAL_OINTMENT | CUTANEOUS | Status: DC

## 2013-10-31 NOTE — Patient Instructions (Signed)
Apply clotrimazole ointment twice daily to corners of mouth. Call if symptoms worse or if symptoms are not improved in 1 week. Apply corn pad to the pressure point on you toe.  Make sure to wear shoes that are not too tight on the front of your foot.   Follow up in 3 months.

## 2013-10-31 NOTE — Progress Notes (Signed)
Pre visit review using our clinic review tool, if applicable. No additional management support is needed unless otherwise documented below in the visit note. 

## 2013-10-31 NOTE — Progress Notes (Signed)
Subjective:    Patient ID: [name] [name], female    DOB: [birth year], 80 y.o.   MRN: [id number]  HPI  [name] [name] is an 80 yr old female who presents today with chief complaint of rash on her face. Notes that it has been present for several months.  Also notes a "sore spot on my toe" but notes that it is "getting better.  Review of Systems     Past Medical History  Diagnosis Date  . Osteoporosis   . Arthritis     osteoarthritis  . GERD (gastroesophageal reflux disease)   . Hiatal hernia   . PUD (peptic ulcer disease)   . Cardiac arrhythmia   . Hyperlipidemia   . Heart failure   . Hypertension   . Scoliosis deformity of spine     history of intractable back pain  . Choledocholithiasis   . Pancolitis     Infectious vs. inflammatory  . Abdominal pain     Resolved  . Chronic anemia   . Chronic back pain   . Pancreatitis     History of  . CHF (congestive heart failure)   . COPD (chronic obstructive pulmonary disease)     oxygen dependent at  night 2LPM    History   Social History  . Marital Status: Widowed    Spouse Name: N/A    Number of Children: 3  . Years of Education: N/A   Occupational History  .     Social History Main Topics  . Smoking status: Former Smoker -- 1.00 packs/day for 12 years    Types: Cigarettes    Quit date: 01/28/1988  . Smokeless tobacco: Never Used  . Alcohol Use: No  . Drug Use: No  . Sexual Activity: No   Other Topics Concern  . Not on file   Social History Narrative   1 grandchild at care link--Angelia   Lives with great-granddaughter    Past Surgical History  Procedure Laterality Date  . Cholecystectomy  2008  . Abdominal hysterectomy  1970s  . Spine surgery  [phone]  . Neck surgery  1970s  . Spinal cord stimulator implant    . Ercp w/ sphicterotomy  02/2010  . Back surgery      x 5  . Pain pump implantation  2009    back, dilaudid and bupravacaine  . Flexible sigmoidoscopy N/A 01/24/2013    Procedure:  FLEXIBLE SIGMOIDOSCOPY;  Surgeon: Missy Sabins, MD;  Location: [place];  Service: Endoscopy;  Laterality: N/A;    Family History  Problem Relation Age of Onset  . Cancer Mother     uterine  . Heart disease Father   . Hypertension Other     Allergies  Allergen Reactions  . Codeine     REACTION: n/v/d, HA  . Morphine     REACTION: n/v/d, HA  . Sulfa Antibiotics   . Zoledronic Acid     REACTION: Severe edema    Current Outpatient Prescriptions on File Prior to Visit  Medication Sig Dispense Refill  . albuterol (PROVENTIL HFA;VENTOLIN HFA) 108 (90 BASE) MCG/ACT inhaler Inhale 1-2 puffs into the lungs 3 (three) times daily as needed for wheezing or shortness of breath.      . Ascorbic Acid (VITAMIN C) 500 MG tablet Take 500 mg by mouth daily.        . Bisacodyl (DULCOLAX PO) Take 1 tablet by mouth as needed.      . budesonide-formoterol (SYMBICORT) 160-4.5 MCG/ACT inhaler Inhale  2 puffs into the lungs 2 (two) times daily.  1 Inhaler  11  . butalbital-acetaminophen-caffeine (FIORICET, ESGIC) 50-325-40 MG per tablet Take 1 tablet by mouth every 8 (eight) hours as needed for headache.      . Calcium Carbonate-Vitamin D (CALCIUM 600-D) 600-400 MG-UNIT per tablet Take 1 tablet by mouth 2 (two) times daily with a meal.        . diclofenac sodium (VOLTAREN) 1 % GEL Apply 1 application topically 4 (four) times daily as needed. Inflammation  100 g  2  . fluticasone (FLONASE) 50 MCG/ACT nasal spray Place 2 sprays into both nostrils daily.       [name] gabapentin (NEURONTIN) 100 MG capsule Take 1 capsule (100 mg total) by mouth 2 (two) times daily.  60 capsule  3  . lactulose (CHRONULAC) 10 GM/15ML solution 10 ml by mouth once daily at bedtime  500 mL  2  . nitroGLYCERIN (NITROSTAT) 0.4 MG SL tablet Place 1 tablet (0.4 mg total) under the tongue every 5 (five) minutes as needed for chest pain.  100 tablet  3  . omeprazole (PRILOSEC) 40 MG capsule TAKE 1 CAPSULE BY MOUTH 2 TIMES DAILY  60 capsule   3  . oxyCODONE-acetaminophen (PERCOCET/ROXICET) 5-325 MG per tablet Take 1 tablet by mouth every 4 (four) hours as needed for moderate pain or severe pain.      . polyethylene glycol (MIRALAX / GLYCOLAX) packet Take 17 g by mouth daily as needed for mild constipation or moderate constipation.  14 each  0  . promethazine (PHENERGAN) 25 MG tablet Take 1 tablet (25 mg total) by mouth as needed. Nausea  30 tablet  0   No current facility-administered medications on file prior to visit.    BP 126/62  Pulse 80  Temp(Src) 98 F (36.7 C) (Oral)  Resp 16  Ht 5' (1.524 m)  Wt 114 lb 12.8 oz (52.073 kg)  BMI 22.42 kg/m2  SpO2 94%    Objective:   Physical Exam  Constitutional: She is oriented to person, place, and time. She appears well-developed and well-nourished. No distress.  HENT:  Head: Normocephalic and atraumatic.  Musculoskeletal: She exhibits no edema.  Right second toe- hammertoe noted.  As a result there is a small pressure ulcer of the right great toe- no erythema, drainage.   Neurological: She is alert and oriented to person, place, and time.  Skin:  Crusting in skin folds angled downward form corners of mouth bilaterally  Psychiatric: She has a normal mood and affect. Her behavior is normal. Judgment and thought content normal.          Assessment & Plan:

## 2013-10-31 NOTE — Assessment & Plan Note (Signed)
Advised that she apply a corn cushion to prevent pressure. Follow up if worsening or if symptoms do not continue to improve.

## 2013-10-31 NOTE — Assessment & Plan Note (Signed)
Trial of clotrimazole cream. Follow up if worsening or if not improved in 1 week. Flu shot today.

## 2013-11-13 ENCOUNTER — Encounter (HOSPITAL_COMMUNITY): Payer: Self-pay | Admitting: Emergency Medicine

## 2013-11-13 ENCOUNTER — Inpatient Hospital Stay (HOSPITAL_COMMUNITY)
Admission: EM | Admit: 2013-11-13 | Discharge: 2013-11-16 | DRG: 378 | Disposition: A | Discharge status: Home / Self Care | Payer: Medicare Other | Attending: Internal Medicine | Admitting: Internal Medicine

## 2013-11-13 ENCOUNTER — Emergency Department (HOSPITAL_COMMUNITY): Payer: Medicare Other

## 2013-11-13 DIAGNOSIS — R51 Headache: Secondary | ICD-10-CM | POA: Diagnosis present

## 2013-11-13 DIAGNOSIS — Z87891 Personal history of nicotine dependence: Secondary | ICD-10-CM

## 2013-11-13 DIAGNOSIS — Z90710 Acquired absence of both cervix and uterus: Secondary | ICD-10-CM

## 2013-11-13 DIAGNOSIS — I279 Pulmonary heart disease, unspecified: Secondary | ICD-10-CM | POA: Diagnosis present

## 2013-11-13 DIAGNOSIS — M549 Dorsalgia, unspecified: Secondary | ICD-10-CM | POA: Diagnosis not present

## 2013-11-13 DIAGNOSIS — Z7952 Long term (current) use of systemic steroids: Secondary | ICD-10-CM

## 2013-11-13 DIAGNOSIS — R1013 Epigastric pain: Secondary | ICD-10-CM

## 2013-11-13 DIAGNOSIS — J449 Chronic obstructive pulmonary disease, unspecified: Secondary | ICD-10-CM | POA: Diagnosis present

## 2013-11-13 DIAGNOSIS — G319 Degenerative disease of nervous system, unspecified: Secondary | ICD-10-CM | POA: Diagnosis not present

## 2013-11-13 DIAGNOSIS — K219 Gastro-esophageal reflux disease without esophagitis: Secondary | ICD-10-CM | POA: Diagnosis present

## 2013-11-13 DIAGNOSIS — K920 Hematemesis: Secondary | ICD-10-CM | POA: Diagnosis present

## 2013-11-13 DIAGNOSIS — Z8711 Personal history of peptic ulcer disease: Secondary | ICD-10-CM

## 2013-11-13 DIAGNOSIS — R109 Unspecified abdominal pain: Secondary | ICD-10-CM

## 2013-11-13 DIAGNOSIS — M810 Age-related osteoporosis without current pathological fracture: Secondary | ICD-10-CM | POA: Diagnosis present

## 2013-11-13 DIAGNOSIS — J439 Emphysema, unspecified: Secondary | ICD-10-CM

## 2013-11-13 DIAGNOSIS — G8929 Other chronic pain: Secondary | ICD-10-CM | POA: Diagnosis present

## 2013-11-13 DIAGNOSIS — Z79891 Long term (current) use of opiate analgesic: Secondary | ICD-10-CM | POA: Diagnosis not present

## 2013-11-13 DIAGNOSIS — I509 Heart failure, unspecified: Secondary | ICD-10-CM | POA: Diagnosis present

## 2013-11-13 DIAGNOSIS — E785 Hyperlipidemia, unspecified: Secondary | ICD-10-CM | POA: Diagnosis present

## 2013-11-13 DIAGNOSIS — I10 Essential (primary) hypertension: Secondary | ICD-10-CM | POA: Diagnosis not present

## 2013-11-13 DIAGNOSIS — I2781 Cor pulmonale (chronic): Secondary | ICD-10-CM | POA: Diagnosis present

## 2013-11-13 DIAGNOSIS — K922 Gastrointestinal hemorrhage, unspecified: Principal | ICD-10-CM | POA: Diagnosis present

## 2013-11-13 DIAGNOSIS — R040 Epistaxis: Secondary | ICD-10-CM | POA: Diagnosis not present

## 2013-11-13 DIAGNOSIS — K2960 Other gastritis without bleeding: Secondary | ICD-10-CM | POA: Diagnosis present

## 2013-11-13 DIAGNOSIS — J438 Other emphysema: Secondary | ICD-10-CM

## 2013-11-13 DIAGNOSIS — K861 Other chronic pancreatitis: Secondary | ICD-10-CM | POA: Diagnosis present

## 2013-11-13 DIAGNOSIS — Z79899 Other long term (current) drug therapy: Secondary | ICD-10-CM

## 2013-11-13 DIAGNOSIS — I700 Atherosclerosis of aorta: Secondary | ICD-10-CM | POA: Diagnosis not present

## 2013-11-13 DIAGNOSIS — Z7951 Long term (current) use of inhaled steroids: Secondary | ICD-10-CM | POA: Diagnosis not present

## 2013-11-13 DIAGNOSIS — R42 Dizziness and giddiness: Secondary | ICD-10-CM | POA: Diagnosis not present

## 2013-11-13 DIAGNOSIS — K5730 Diverticulosis of large intestine without perforation or abscess without bleeding: Secondary | ICD-10-CM | POA: Diagnosis not present

## 2013-11-13 DIAGNOSIS — R519 Headache, unspecified: Secondary | ICD-10-CM

## 2013-11-13 LAB — CBC
HCT: 39.8 % (ref 36.0–46.0)
Hemoglobin: 13 g/dL (ref 12.0–15.0)
MCH: 30.6 pg (ref 26.0–34.0)
MCHC: 32.7 g/dL (ref 30.0–36.0)
MCV: 93.6 fL (ref 78.0–100.0)
Platelets: 187 10*3/uL (ref 150–400)
RBC: 4.25 MIL/uL (ref 3.87–5.11)
RDW: 13.2 % (ref 11.5–15.5)
WBC: 4.7 10*3/uL (ref 4.0–10.5)

## 2013-11-13 LAB — POC OCCULT BLOOD, ED: Fecal Occult Bld: NEGATIVE

## 2013-11-13 LAB — TYPE AND SCREEN
ABO/RH(D): O POS
Antibody Screen: NEGATIVE

## 2013-11-13 LAB — COMPREHENSIVE METABOLIC PANEL
ALT: 12 U/L (ref 0–35)
AST: 17 U/L (ref 0–37)
Albumin: 3.5 g/dL (ref 3.5–5.2)
Alkaline Phosphatase: 93 U/L (ref 39–117)
Anion gap: 10 (ref 5–15)
BUN: 18 mg/dL (ref 6–23)
CO2: 30 mEq/L (ref 19–32)
Calcium: 8.8 mg/dL (ref 8.4–10.5)
Chloride: 99 mEq/L (ref 96–112)
Creatinine, Ser: 0.67 mg/dL (ref 0.50–1.10)
GFR calc Af Amer: 90 mL/min — ABNORMAL LOW (ref >90)
GFR calc non Af Amer: 77 mL/min — ABNORMAL LOW (ref >90)
Glucose, Bld: 103 mg/dL — ABNORMAL HIGH (ref 70–99)
Potassium: 4.4 mEq/L (ref 3.7–5.3)
Sodium: 139 mEq/L (ref 137–147)
Total Bilirubin: <0.2 mg/dL — ABNORMAL LOW (ref 0.3–1.2)
Total Protein: 6.7 g/dL (ref 6.0–8.3)

## 2013-11-13 LAB — PROTIME-INR
INR: 1.01 (ref 0.00–1.49)
Prothrombin Time: 13.4 seconds (ref 11.6–15.2)

## 2013-11-13 MED ORDER — SODIUM CHLORIDE 0.9 % IV BOLUS (SEPSIS)
500.0000 mL | Freq: Once | INTRAVENOUS | Status: AC
  Administered 2013-11-13: 500 mL via INTRAVENOUS

## 2013-11-13 MED ORDER — SODIUM CHLORIDE 0.9 % IV SOLN
INTRAVENOUS | Status: DC
  Administered 2013-11-13: 21:00:00 via INTRAVENOUS

## 2013-11-13 MED ORDER — HYDROMORPHONE HCL 1 MG/ML IJ SOLN
0.5000 mg | Freq: Once | INTRAMUSCULAR | Status: AC
  Administered 2013-11-13: 0.5 mg via INTRAVENOUS
  Filled 2013-11-13: qty 1

## 2013-11-13 MED ORDER — ONDANSETRON HCL 4 MG/2ML IJ SOLN
4.0000 mg | Freq: Once | INTRAMUSCULAR | Status: AC
  Administered 2013-11-13: 4 mg via INTRAVENOUS
  Filled 2013-11-13: qty 2

## 2013-11-13 NOTE — ED Notes (Signed)
Report attempted 

## 2013-11-13 NOTE — ED Notes (Addendum)
C/o mid upper abd pain x 10 days.  Reports vomited clots with frank blood x 2 today.  Also reports dizziness.  Granddaughter Investment banker, [employer]) reports irregular HR.

## 2013-11-13 NOTE — ED Provider Notes (Signed)
CSN: [id number]     Arrival date & time 11/13/13  1651 History   First MD Initiated Contact with Patient 11/13/13 1742     Chief Complaint  Patient presents with  . GI Bleeding  . Dizziness     (Consider location/radiation/quality/duration/timing/severity/associated sxs/prior Treatment) HPI [name] [name] is a 80 y.o. female who presents for evaluation of bloody emesis. This occurred today, x2 episodes, multiple times each. She had preceding upper abdominal discomfort for about 10 days, associated with decreased oral intake. She has previously had upper GI bleeds, associated with peptic ulcer disease. She has been using Goody  powders, for back pain, intermittently for several months. No recent fever, chills, cough, shortness of breath, diarrhea, bloody stool or problems walking. She recently took a trip to [place] with her granddaughter. She is using her usual medications. There are no other known modifying factors.    Past Medical History  Diagnosis Date  . Osteoporosis   . Arthritis     osteoarthritis  . GERD (gastroesophageal reflux disease)   . Hiatal hernia   . PUD (peptic ulcer disease)   . Cardiac arrhythmia   . Hyperlipidemia   . Heart failure   . Hypertension   . Scoliosis deformity of spine     history of intractable back pain  . Choledocholithiasis   . Pancolitis     Infectious vs. inflammatory  . Abdominal pain     Resolved  . Chronic anemia   . Chronic back pain   . Pancreatitis     History of  . CHF (congestive heart failure)   . COPD (chronic obstructive pulmonary disease)     oxygen dependent at  night 2LPM   Past Surgical History  Procedure Laterality Date  . Cholecystectomy  2008  . Abdominal hysterectomy  1970s  . Spine surgery  [phone]  . Neck surgery  1970s  . Spinal cord stimulator implant    . Ercp w/ sphicterotomy  02/2010  . Back surgery      x 5  . Pain pump implantation  2009    back, dilaudid and bupravacaine  . Flexible  sigmoidoscopy N/A 01/24/2013    Procedure: FLEXIBLE SIGMOIDOSCOPY;  Surgeon: Missy Sabins, MD;  Location: [facility];  Service: Endoscopy;  Laterality: N/A;   Family History  Problem Relation Age of Onset  . Cancer Mother     uterine  . Heart disease Father   . Hypertension Other    History  Substance Use Topics  . Smoking status: Former Smoker -- 1.00 packs/day for 12 years    Types: Cigarettes    Quit date: 01/28/1988  . Smokeless tobacco: Never Used  . Alcohol Use: No   OB History   Grav Para Term Preterm Abortions TAB SAB Ect Mult Living                 Review of Systems  All other systems reviewed and are negative.     Allergies  Codeine; Morphine; Sulfa antibiotics; and Zoledronic acid  Home Medications   Prior to Admission medications   Medication Sig Start Date End Date Taking? Authorizing Provider  albuterol (PROVENTIL HFA;VENTOLIN HFA) 108 (90 BASE) MCG/ACT inhaler Inhale 1-2 puffs into the lungs 3 (three) times daily as needed for wheezing or shortness of breath. Inhale 1 puff when shortness of breath starts, the  Another puff if shortness of breath continues   Yes Historical Provider, MD  Ascorbic Acid (VITAMIN C) 500 MG tablet Take 500 mg  by mouth daily.     Yes Historical Provider, MD  budesonide-formoterol (SYMBICORT) 160-4.5 MCG/ACT inhaler Inhale 2 puffs into the lungs daily as needed. For shortness of breath 11/01/12  Yes Elsie Stain, MD  butalbital-acetaminophen-caffeine (FIORICET, ESGIC) [phone] MG per tablet Take 1 tablet by mouth every 8 (eight) hours as needed for headache.   Yes Historical Provider, MD  Calcium Carbonate-Vitamin D (CALCIUM 600-D) 600-400 MG-UNIT per tablet Take 1 tablet by mouth 2 (two) times daily with a meal.     Yes Historical Provider, MD  Clotrimazole 1 % OINT Take 1 application by mouth daily as needed. Apply twice daily to corners of your mouth until healed. Then as needed. For sores 10/31/13  Yes Debbrah Alar, NP   diclofenac sodium (VOLTAREN) 1 % GEL Apply 1 application topically 4 (four) times daily as needed. Inflammation 09/15/12  Yes Debbrah Alar, NP  gabapentin (NEURONTIN) 100 MG capsule Take 1 capsule (100 mg total) by mouth 2 (two) times daily. 08/23/13  Yes Debbrah Alar, NP  lactulose (CHRONULAC) 10 GM/15ML solution Take 10 g by mouth daily as needed for mild constipation. 10 ml by mouth once daily at bedtime 03/30/13  Yes Debbrah Alar, NP  nitroGLYCERIN (NITROSTAT) 0.4 MG SL tablet Place 1 tablet (0.4 mg total) under the tongue every 5 (five) minutes as needed for chest pain. 11/11/10  Yes Darlin Coco, MD  omeprazole (PRILOSEC) 40 MG capsule Take 40 mg by mouth 2 (two) times daily. 08/26/13  Yes Debbrah Alar, NP  oxyCODONE-acetaminophen (PERCOCET/ROXICET) 5-325 MG per tablet Take 1 tablet by mouth every 4 (four) hours as needed for moderate pain or severe pain.   Yes Historical Provider, MD  polyethylene glycol (MIRALAX / GLYCOLAX) packet Take 17 g by mouth daily as needed for mild constipation or moderate constipation. 01/27/13  Yes Donne Hazel, MD   BP 113/58  Pulse 92  Temp(Src) 99.6 F (37.6 C) (Rectal)  Resp 19  Ht 5\' 1"  (1.549 m)  Wt 115 lb (52.164 kg)  BMI 21.74 kg/m2  SpO2 99% Physical Exam  Nursing note and vitals reviewed. Constitutional: She is oriented to person, place, and time. She appears well-developed.  Elderly, frail  HENT:  Head: Normocephalic and atraumatic.  Eyes: Conjunctivae and EOM are normal. Pupils are equal, round, and reactive to light.  Neck: Normal range of motion and phonation normal. Neck supple.  Cardiovascular: Normal rate and regular rhythm.   Pulmonary/Chest: Effort normal and breath sounds normal. She exhibits no tenderness.  Abdominal: Soft. She exhibits no distension and no mass. There is no tenderness. There is no guarding.  Musculoskeletal: Normal range of motion.  Neurological: She is alert and oriented to person, place,  and time. She exhibits normal muscle tone.  Skin: Skin is warm and dry.  Psychiatric: She has a normal mood and affect. Her behavior is normal. Judgment and thought content normal.    ED Course  Procedures (including critical care time)  Medications  0.9 %  sodium chloride infusion ( Intravenous New Bag/Given 11/13/13 2050)  HYDROmorphone (DILAUDID) injection 0.5 mg (0.5 mg Intravenous Given 11/13/13 1944)  ondansetron (ZOFRAN) injection 4 mg (4 mg Intravenous Given 11/13/13 1944)  sodium chloride 0.9 % bolus 500 mL (0 mLs Intravenous Stopped 11/13/13 2025)    Patient Vitals for the past 24 hrs:  BP Temp Temp src Pulse Resp SpO2 Height Weight  11/13/13 2251 113/58 mmHg - - 92 19 99 % - -  11/13/13 2200 96/73 mmHg - -  89 15 99 % - -  11/13/13 2144 125/58 mmHg - - 88 23 100 % - -  11/13/13 2130 125/58 mmHg - - 89 17 99 % - -  11/13/13 1930 144/64 mmHg - - 90 21 100 % - -  11/13/13 1900 135/80 mmHg - - 89 19 100 % - -  11/13/13 1800 - 99.6 F (37.6 C) Rectal - - - - -  11/13/13 1745 132/90 mmHg - - 95 12 95 % - -  11/13/13 1709 149/78 mmHg 99.4 F (37.4 C) Oral 111 - 90 % 5\' 1"  (1.549 m) 115 lb (52.164 kg)    10:08 PM Reevaluation with update and discussion. After initial assessment and treatment, an updated evaluation reveals she remains comfortable, calm, and lucid. No additional episodes of vomiting, or blood from mouth. Findings discussed with patient and family members, all questions answered. Jatinder Mcdonagh L    Labs Review Labs Reviewed  COMPREHENSIVE METABOLIC PANEL - Abnormal; Notable for the following:    Glucose, Bld 103 (*)    Total Bilirubin <0.2 (*)    GFR calc non Af Amer 77 (*)    GFR calc Af Amer 90 (*)    All other components within normal limits  PROTIME-INR  CBC  POC OCCULT BLOOD, ED  TYPE AND SCREEN    Imaging Review Dg Chest 2 View  11/13/2013   CLINICAL DATA:  Nose bleed and headache started today initial evaluation, personal history of COPD, CHF,  and hypertension  EXAM: CHEST  2 VIEW  COMPARISON:  01/21/2013  FINDINGS: Spinal stimulator leads project over the thoracic spine, stable. Heart size and vascular pattern are normal. Aortic arch calcifications stable. Lungs clear. No effusions. Significant scoliosis stable.  IMPRESSION: No acute findings   Electronically Signed   By: Skipper Cliche M.D.   On: 11/13/2013 20:43     Date: 11/13/13  Rate: 100  Rhythm: normal sinus rhythm and sinus arrhythmia  QRS Axis: normal  PR and QT Intervals: normal  ST/T Wave abnormalities: normal  PR and QRS Conduction Disutrbances:none  Narrative Interpretation:   Old EKG Reviewed: unchanged    EKG Interpretation None      MDM   Final diagnoses:  Upper GI bleeding    Blood from mouth, with vomiting, or cough, today, on multiple episodes. She is hemodynamically stable with normal hemoglobin. She will need to be admitted for monitoring, and consideration for GI versus pulmonary evaluation  Nursing Notes Reviewed/ Care Coordinated, and agree without changes. Applicable Imaging Reviewed.  Interpretation of Laboratory Data incorporated into ED treatment  Plan: Admit    Richarda Blade, MD 11/13/13 2339

## 2013-11-13 NOTE — ED Notes (Signed)
This RN called xray to determine cause of delay, was informed patient is next to be transported. Pt and family updated.

## 2013-11-13 NOTE — H&P (Signed)
[facility] History and Physical  [name] [name] IRJ:[id number] DOB: 06/08/1925 DOA: 11/13/2013  Referring physician: ER physician. PCP: [name], NP   Chief Complaint: Vomiting blood.  HPI: [name] [name] is a 80 y.o. female with history of COPD, chronic pancreatitis status post antrectomy, cor pulmonale was brought to the ER by patient's family after patient was found to have possible hematemesis. Last night pain was in [place] and was complaining of abdominal pain. Patient had very little food yesterday due to the abdominal pain and patient had slept for 14 hours. When she woke up she still was having discomfort in the epigastric area. On her way back to [place] they had stopped in a rest area and patient had thrown up blood. This happened twice. In the ER patient's stool for occult blood was negative. Patient is admitted for further observation. Patient's with complaints of abdominal discomfort and in the telemetry there was some PACs. Patient otherwise did not have any chest pain or shortness of breath fever chills. As per the family patient has been using Goody powder a lot. About 10 days ago patient had 2 days of diarrhea.  Review of Systems: As presented in the history of presenting illness, rest negative.  Past Medical History  Diagnosis Date  . Osteoporosis   . Arthritis     osteoarthritis  . GERD (gastroesophageal reflux disease)   . Hiatal hernia   . PUD (peptic ulcer disease)   . Cardiac arrhythmia   . Hyperlipidemia   . Heart failure   . Hypertension   . Scoliosis deformity of spine     history of intractable back pain  . Choledocholithiasis   . Pancolitis     Infectious vs. inflammatory  . Abdominal pain     Resolved  . Chronic anemia   . Chronic back pain   . Pancreatitis     History of  . CHF (congestive heart failure)   . COPD (chronic obstructive pulmonary disease)     oxygen dependent at  night 2LPM   Past Surgical History  Procedure  Laterality Date  . Cholecystectomy  2008  . Abdominal hysterectomy  1970s  . Spine surgery  [phone]  . Neck surgery  1970s  . Spinal cord stimulator implant    . Ercp w/ sphicterotomy  02/2010  . Back surgery      x 5  . Pain pump implantation  2009    back, dilaudid and bupravacaine  . Flexible sigmoidoscopy N/A 01/24/2013    Procedure: FLEXIBLE SIGMOIDOSCOPY;  Surgeon: Missy Sabins, MD;  Location: [facility];  Service: Endoscopy;  Laterality: N/A;   Social History:  reports that she quit smoking about 25 years ago. Her smoking use included Cigarettes. She has a 12 pack-year smoking history. She has never used smokeless tobacco. She reports that she does not drink alcohol or use illicit drugs. Where does patient live home. Can patient participate in ADLs? Yes.  Allergies  Allergen Reactions  . Codeine     REACTION: n/v/d, HA  . Morphine     REACTION: n/v/d, HA  . Sulfa Antibiotics     Sick    . Zoledronic Acid     REACTION: Severe edema    Family History:  Family History  Problem Relation Age of Onset  . Cancer Mother     uterine  . Heart disease Father   . Hypertension Other       Prior to Admission medications   Medication Sig Start Date End  Date Taking? Authorizing Provider  albuterol (PROVENTIL HFA;VENTOLIN HFA) 108 (90 BASE) MCG/ACT inhaler Inhale 1-2 puffs into the lungs 3 (three) times daily as needed for wheezing or shortness of breath. Inhale 1 puff when shortness of breath starts, the  Another puff if shortness of breath continues   Yes Historical Provider, MD  Ascorbic Acid (VITAMIN C) 500 MG tablet Take 500 mg by mouth daily.     Yes Historical Provider, MD  budesonide-formoterol (SYMBICORT) 160-4.5 MCG/ACT inhaler Inhale 2 puffs into the lungs daily as needed. For shortness of breath 11/01/12  Yes Elsie Stain, MD  butalbital-acetaminophen-caffeine (FIORICET, ESGIC) [phone] MG per tablet Take 1 tablet by mouth every 8 (eight) hours as needed  for headache.   Yes Historical Provider, MD  Calcium Carbonate-Vitamin D (CALCIUM 600-D) 600-400 MG-UNIT per tablet Take 1 tablet by mouth 2 (two) times daily with a meal.     Yes Historical Provider, MD  Clotrimazole 1 % OINT Take 1 application by mouth daily as needed. Apply twice daily to corners of your mouth until healed. Then as needed. For sores 10/31/13  Yes Debbrah Alar, NP  diclofenac sodium (VOLTAREN) 1 % GEL Apply 1 application topically 4 (four) times daily as needed. Inflammation 09/15/12  Yes Debbrah Alar, NP  gabapentin (NEURONTIN) 100 MG capsule Take 1 capsule (100 mg total) by mouth 2 (two) times daily. 08/23/13  Yes Debbrah Alar, NP  lactulose (CHRONULAC) 10 GM/15ML solution Take 10 g by mouth daily as needed for mild constipation. 10 ml by mouth once daily at bedtime 03/30/13  Yes Debbrah Alar, NP  nitroGLYCERIN (NITROSTAT) 0.4 MG SL tablet Place 1 tablet (0.4 mg total) under the tongue every 5 (five) minutes as needed for chest pain. 11/11/10  Yes Darlin Coco, MD  omeprazole (PRILOSEC) 40 MG capsule Take 40 mg by mouth 2 (two) times daily. 08/26/13  Yes Debbrah Alar, NP  oxyCODONE-acetaminophen (PERCOCET/ROXICET) 5-325 MG per tablet Take 1 tablet by mouth every 4 (four) hours as needed for moderate pain or severe pain.   Yes Historical Provider, MD  polyethylene glycol (MIRALAX / GLYCOLAX) packet Take 17 g by mouth daily as needed for mild constipation or moderate constipation. 01/27/13  Yes Donne Hazel, MD    Physical Exam: Filed Vitals:   11/13/13 2130 11/13/13 2144 11/13/13 2200 11/13/13 2251  BP: 125/58 125/58 96/73 113/58  Pulse: 89 88 89 92  Temp:      TempSrc:      Resp: 17 23 15 19   Height:      Weight:      SpO2: 99% 100% 99% 99%     General:  Moderately built and nourished.  Eyes: Anicteric no pallor.  ENT: No discharge from the ears eyes nose mouth.  Neck: No mass felt.  Cardiovascular: S1-S2 heard.  Respiratory: No  rhonchi or crepitations.  Abdomen: Soft nontender bowel sounds present. No guarding rigidity.  Skin: No rash.  Musculoskeletal: No edema.  Psychiatric: Appears normal.  Neurologic: Alert awake oriented to her name and moves all extremities.  Labs on Admission:  Basic Metabolic Panel:  Recent Labs Lab 11/13/13 1750  NA 139  K 4.4  CL 99  CO2 30  GLUCOSE 103*  BUN 18  CREATININE 0.67  CALCIUM 8.8   Liver Function Tests:  Recent Labs Lab 11/13/13 1750  AST 17  ALT 12  ALKPHOS 93  BILITOT <0.2*  PROT 6.7  ALBUMIN 3.5   No results found for this basename: LIPASE,  AMYLASE,  in the last 168 hours No results found for this basename: AMMONIA,  in the last 168 hours CBC:  Recent Labs Lab 11/13/13 1750  WBC 4.7  HGB 13.0  HCT 39.8  MCV 93.6  PLT 187   Cardiac Enzymes: No results found for this basename: CKTOTAL, CKMB, CKMBINDEX, TROPONINI,  in the last 168 hours  BNP (last 3 results)  Recent Labs  01/21/13 1011  PROBNP 911.1*   CBG: No results found for this basename: GLUCAP,  in the last 168 hours  Radiological Exams on Admission: Dg Chest 2 View  11/13/2013   CLINICAL DATA:  Nose bleed and headache started today initial evaluation, personal history of COPD, CHF, and hypertension  EXAM: CHEST  2 VIEW  COMPARISON:  01/21/2013  FINDINGS: Spinal stimulator leads project over the thoracic spine, stable. Heart size and vascular pattern are normal. Aortic arch calcifications stable. Lungs clear. No effusions. Significant scoliosis stable.  IMPRESSION: No acute findings   Electronically Signed   By: Skipper Cliche M.D.   On: 11/13/2013 20:43     Assessment/Plan Principal Problem:   Hematemesis Active Problems:   Chronic pulmonary heart disease   COPD with emphysema, gold stage C.   Chronic back pain   Abdominal pain, chronic, epigastric   1. Hematemesis - given the history of gastritis and use of Goody powder patient at this time will be kept n.p.o.  and on Protonix drip and closely follow CBC. May need GI consult in a.m. 2. Abdominal pain with history of chronic pancreatitis - check CT abdomen and pelvis. 3. COPD - presently not wheezing. 4. History of cor pulmonale. 5. History of chronic pain with patient has Dilaudid pump.    Code Status: Full code.  Family Communication: Family at the bedside.  Disposition Plan: Admit to inpatient.    Velencia Lenart N. [facility] Pager [phone].  If 7PM-7AM, please contact night-coverage [email or web address] Password [email or web address] 11/13/2013, 10:54 PM

## 2013-11-13 NOTE — ED Notes (Signed)
Pt and family requesting pain medication for pts headache. Pt and family requesting dilaudid IV. Dr. Eulis Foster to order.

## 2013-11-14 ENCOUNTER — Encounter (HOSPITAL_COMMUNITY)
Admission: EM | Disposition: A | Discharge status: Home / Self Care | Payer: Self-pay | Source: Home / Self Care | Attending: Internal Medicine

## 2013-11-14 ENCOUNTER — Observation Stay (HOSPITAL_COMMUNITY): Payer: Medicare Other

## 2013-11-14 ENCOUNTER — Encounter (HOSPITAL_COMMUNITY): Payer: Self-pay | Admitting: Radiology

## 2013-11-14 DIAGNOSIS — I279 Pulmonary heart disease, unspecified: Secondary | ICD-10-CM

## 2013-11-14 DIAGNOSIS — K5730 Diverticulosis of large intestine without perforation or abscess without bleeding: Secondary | ICD-10-CM | POA: Diagnosis not present

## 2013-11-14 DIAGNOSIS — I700 Atherosclerosis of aorta: Secondary | ICD-10-CM | POA: Diagnosis not present

## 2013-11-14 DIAGNOSIS — M549 Dorsalgia, unspecified: Secondary | ICD-10-CM

## 2013-11-14 DIAGNOSIS — K920 Hematemesis: Secondary | ICD-10-CM | POA: Diagnosis not present

## 2013-11-14 HISTORY — PX: ESOPHAGOGASTRODUODENOSCOPY: SHX5428

## 2013-11-14 LAB — COMPREHENSIVE METABOLIC PANEL
ALT: 9 U/L (ref 0–35)
AST: 16 U/L (ref 0–37)
Albumin: 2.9 g/dL — ABNORMAL LOW (ref 3.5–5.2)
Alkaline Phosphatase: 80 U/L (ref 39–117)
Anion gap: 10 (ref 5–15)
BUN: 11 mg/dL (ref 6–23)
CO2: 28 mEq/L (ref 19–32)
Calcium: 8.3 mg/dL — ABNORMAL LOW (ref 8.4–10.5)
Chloride: 105 mEq/L (ref 96–112)
Creatinine, Ser: 0.63 mg/dL (ref 0.50–1.10)
GFR calc Af Amer: >90 mL/min (ref >90)
GFR calc non Af Amer: 79 mL/min — ABNORMAL LOW (ref >90)
Glucose, Bld: 121 mg/dL — ABNORMAL HIGH (ref 70–99)
Potassium: 4.2 mEq/L (ref 3.7–5.3)
Sodium: 143 mEq/L (ref 137–147)
Total Bilirubin: <0.2 mg/dL — ABNORMAL LOW (ref 0.3–1.2)
Total Protein: 5.8 g/dL — ABNORMAL LOW (ref 6.0–8.3)

## 2013-11-14 LAB — CBC
HCT: 36.6 % (ref 36.0–46.0)
HCT: 37.4 % (ref 36.0–46.0)
HCT: 37.2 % (ref 36.0–46.0)
HCT: 37.9 % (ref 36.0–46.0)
Hemoglobin: 11.9 g/dL — ABNORMAL LOW (ref 12.0–15.0)
Hemoglobin: 11.9 g/dL — ABNORMAL LOW (ref 12.0–15.0)
Hemoglobin: 12 g/dL (ref 12.0–15.0)
Hemoglobin: 12.2 g/dL (ref 12.0–15.0)
MCH: 30.7 pg (ref 26.0–34.0)
MCH: 31.1 pg (ref 26.0–34.0)
MCH: 30.5 pg (ref 26.0–34.0)
MCH: 30.4 pg (ref 26.0–34.0)
MCHC: 32.5 g/dL (ref 30.0–36.0)
MCHC: 31.8 g/dL (ref 30.0–36.0)
MCHC: 32.3 g/dL (ref 30.0–36.0)
MCHC: 32.2 g/dL (ref 30.0–36.0)
MCV: 94.3 fL (ref 78.0–100.0)
MCV: 97.7 fL (ref 78.0–100.0)
MCV: 94.7 fL (ref 78.0–100.0)
MCV: 94.5 fL (ref 78.0–100.0)
Platelets: 154 10*3/uL (ref 150–400)
Platelets: 156 10*3/uL (ref 150–400)
Platelets: 152 10*3/uL (ref 150–400)
Platelets: 166 10*3/uL (ref 150–400)
RBC: 3.88 MIL/uL (ref 3.87–5.11)
RBC: 3.83 MIL/uL — ABNORMAL LOW (ref 3.87–5.11)
RBC: 3.93 MIL/uL (ref 3.87–5.11)
RBC: 4.01 MIL/uL (ref 3.87–5.11)
RDW: 13.2 % (ref 11.5–15.5)
RDW: 13.4 % (ref 11.5–15.5)
RDW: 13.2 % (ref 11.5–15.5)
RDW: 13.1 % (ref 11.5–15.5)
WBC: 4.1 10*3/uL (ref 4.0–10.5)
WBC: 4.5 10*3/uL (ref 4.0–10.5)
WBC: 4.3 10*3/uL (ref 4.0–10.5)
WBC: 3.8 10*3/uL — ABNORMAL LOW (ref 4.0–10.5)

## 2013-11-14 LAB — GLUCOSE, CAPILLARY
Glucose-Capillary: 122 mg/dL — ABNORMAL HIGH (ref 70–99)
Glucose-Capillary: 112 mg/dL — ABNORMAL HIGH (ref 70–99)
Glucose-Capillary: 86 mg/dL (ref 70–99)
Glucose-Capillary: 86 mg/dL (ref 70–99)
Glucose-Capillary: 120 mg/dL — ABNORMAL HIGH (ref 70–99)

## 2013-11-14 LAB — TROPONIN I: Troponin I: <0.3 ng/mL (ref <0.30)

## 2013-11-14 LAB — CBG MONITORING, ED: Glucose-Capillary: 82 mg/dL (ref 70–99)

## 2013-11-14 LAB — MAGNESIUM: Magnesium: 2.1 mg/dL (ref 1.5–2.5)

## 2013-11-14 LAB — LIPASE, BLOOD: Lipase: 9 U/L — ABNORMAL LOW (ref 11–59)

## 2013-11-14 SURGERY — EGD (ESOPHAGOGASTRODUODENOSCOPY)
Anesthesia: Moderate Sedation

## 2013-11-14 MED ORDER — SODIUM CHLORIDE 0.9 % IV SOLN
80.0000 mg | Freq: Once | INTRAVENOUS | Status: AC
  Administered 2013-11-14: 80 mg via INTRAVENOUS
  Filled 2013-11-14: qty 80

## 2013-11-14 MED ORDER — GABAPENTIN 100 MG PO CAPS
100.0000 mg | ORAL_CAPSULE | Freq: Two times a day (BID) | ORAL | Status: DC
Start: 2013-11-14 — End: 2013-11-16
  Administered 2013-11-14 – 2013-11-16 (×5): 100 mg via ORAL
  Filled 2013-11-14 (×7): qty 1

## 2013-11-14 MED ORDER — BUDESONIDE-FORMOTEROL FUMARATE 160-4.5 MCG/ACT IN AERO
2.0000 | INHALATION_SPRAY | Freq: Two times a day (BID) | RESPIRATORY_TRACT | Status: DC
  Administered 2013-11-14 – 2013-11-16 (×5): 2 via RESPIRATORY_TRACT
  Filled 2013-11-14: qty 6

## 2013-11-14 MED ORDER — FENTANYL CITRATE 0.05 MG/ML IJ SOLN
INTRAMUSCULAR | Status: AC
  Filled 2013-11-14: qty 2

## 2013-11-14 MED ORDER — OXYCODONE-ACETAMINOPHEN 5-325 MG PO TABS
1.0000 | ORAL_TABLET | ORAL | Status: DC | PRN
  Administered 2013-11-14 – 2013-11-16 (×3): 1 via ORAL
  Filled 2013-11-14 (×3): qty 1

## 2013-11-14 MED ORDER — NITROGLYCERIN 0.4 MG SL SUBL: 0.4000 mg | SUBLINGUAL_TABLET | SUBLINGUAL | Status: DC | PRN

## 2013-11-14 MED ORDER — SODIUM CHLORIDE 0.9 % IV SOLN
8.0000 mg/h | INTRAVENOUS | Status: DC
  Administered 2013-11-14 – 2013-11-16 (×6): 8 mg/h via INTRAVENOUS
  Filled 2013-11-14 (×13): qty 80

## 2013-11-14 MED ORDER — PANTOPRAZOLE SODIUM 40 MG IV SOLR: 40.0000 mg | Freq: Two times a day (BID) | INTRAVENOUS | Status: DC

## 2013-11-14 MED ORDER — ONDANSETRON HCL 4 MG PO TABS: 4.0000 mg | ORAL_TABLET | Freq: Four times a day (QID) | ORAL | Status: DC | PRN

## 2013-11-14 MED ORDER — FENTANYL CITRATE 0.05 MG/ML IJ SOLN: 25.0000 ug | INTRAMUSCULAR | Status: DC | PRN

## 2013-11-14 MED ORDER — BUTAMBEN-TETRACAINE-BENZOCAINE 2-2-14 % EX AERO
INHALATION_SPRAY | CUTANEOUS | Status: DC | PRN
  Administered 2013-11-14: 2 via TOPICAL

## 2013-11-14 MED ORDER — SODIUM CHLORIDE 0.9 % IV SOLN: INTRAVENOUS | Status: DC

## 2013-11-14 MED ORDER — FENTANYL CITRATE 0.05 MG/ML IJ SOLN
50.0000 ug | INTRAMUSCULAR | Status: DC | PRN
  Administered 2013-11-14: 50 ug via INTRAVENOUS
  Filled 2013-11-14: qty 2

## 2013-11-14 MED ORDER — DEXTROSE-NACL 5-0.9 % IV SOLN
INTRAVENOUS | Status: DC
Start: 2013-11-14 — End: 2013-11-14
  Administered 2013-11-13: 02:00:00 via INTRAVENOUS

## 2013-11-14 MED ORDER — ACETAMINOPHEN 650 MG RE SUPP: 650.0000 mg | Freq: Four times a day (QID) | RECTAL | Status: DC | PRN

## 2013-11-14 MED ORDER — ALBUTEROL SULFATE (2.5 MG/3ML) 0.083% IN NEBU: 3.0000 mL | INHALATION_SOLUTION | Freq: Three times a day (TID) | RESPIRATORY_TRACT | Status: DC | PRN

## 2013-11-14 MED ORDER — SODIUM CHLORIDE 0.9 % IJ SOLN
3.0000 mL | Freq: Two times a day (BID) | INTRAMUSCULAR | Status: DC
  Administered 2013-11-14: 3 mL via INTRAVENOUS

## 2013-11-14 MED ORDER — IOHEXOL 300 MG/ML  SOLN
80.0000 mL | Freq: Once | INTRAMUSCULAR | Status: AC | PRN
  Administered 2013-11-14: 80 mL via INTRAVENOUS

## 2013-11-14 MED ORDER — ACETAMINOPHEN 325 MG PO TABS
650.0000 mg | ORAL_TABLET | Freq: Four times a day (QID) | ORAL | Status: DC | PRN
Start: 2013-11-14 — End: 2013-11-16
  Filled 2013-11-14: qty 2

## 2013-11-14 MED ORDER — FENTANYL CITRATE 0.05 MG/ML IJ SOLN
INTRAMUSCULAR | Status: DC | PRN
  Administered 2013-11-14: 25 ug via INTRAVENOUS

## 2013-11-14 MED ORDER — MIDAZOLAM HCL 5 MG/ML IJ SOLN
INTRAMUSCULAR | Status: AC
  Filled 2013-11-14: qty 2

## 2013-11-14 MED ORDER — ONDANSETRON HCL 4 MG/2ML IJ SOLN
4.0000 mg | Freq: Four times a day (QID) | INTRAMUSCULAR | Status: DC | PRN
  Administered 2013-11-14: 4 mg via INTRAVENOUS
  Filled 2013-11-14: qty 2

## 2013-11-14 MED ORDER — MIDAZOLAM HCL 10 MG/2ML IJ SOLN
INTRAMUSCULAR | Status: DC | PRN
  Administered 2013-11-14: 2 mg via INTRAVENOUS

## 2013-11-14 NOTE — Op Note (Signed)
[facility] [place], [id number]   ENDOSCOPY PROCEDURE REPORT  [name], [name] [name]  MR#: [id number] BIRTHDATE: 1925-03-16 , 59  yrs. old GENDER: female ENDOSCOPIST:Creg Gilmer Amedeo Plenty, MD REFERRED BY: PROCEDURE DATE:  29-Nov-2013 PROCEDURE: ASA CLASS:    3 INDICATIONS: hematemesis MEDICATION: Demerol 25, fentanyl 3 mg TOPICAL ANESTHETIC:  DESCRIPTION OF PROCEDURE:   After the risks and benefits of the procedure were explained, informed consent was obtained.  The [id number] ([id number])  endoscope was introduced through the mouth  and advanced to the second portion of the duodenum .  The instrument was slowly withdrawn as the mucosa was fully examined.    esophagus normal, no varices esophageal ulcer or Mallory-Weiss tear Stomach mild antral gastritis with no focal erosion ulcer or other stigmata of hemorrhage, no blood in stomach         duodenum normal         The scope was then withdrawn from the patient and the procedure completed.  COMPLICATIONS: There were no immediate complications.  ENDOSCOPIC IMPRESSION: antral gastritis otherwise normal study, no evidence of recent GI bleeding and no focal source of hemorrhage RECOMMENDATIONS: empiric PPI therapy and avoidance of goody powders.   _______________________________ Lorrin MaisTeena Irani, MD 2013-11-29 1:17 PM     cc:  CPT CODES: ICD CODES:  The ICD and CPT codes recommended by this software are interpretations from the data that the clinical staff has captured with the software.  The verification of the translation of this report to the ICD and CPT codes and modifiers is the sole responsibility of the health care institution and practicing physician where this report was generated.  [employer]. will not be held responsible for the validity of the ICD and CPT codes included on this report.  AMA assumes no liability for data contained or not contained herein.  CPT is a Designer, television/film set of the [employer].

## 2013-11-14 NOTE — Consult Note (Signed)
[facility] Consult Note  Referring Provider: No ref. provider found Primary Care Physician:  [name], NP Primary Gastroenterologist:  Dr.  Laurel Dimmer Complaint: Vomiting blood HPI: [name] [name] is an 80 y.o. white female  who presented yesterday while traveling from [place] with abdominal pain and 2 episodes of witnessed bright red hematemesis by her granddaughter. She was never hemodynamically stable and never passed any melena. She does admit to eating a lot of good he powders. She has had EGDs in 2001 as well as ERCPs in 2003 for common bile duct stones but no known upper GI bleeding. She apparently does have a remote history of peptic ulcer disease. Her hemoglobin was 12.2 and her stool was heme-negative when presenting to the emergency room.  Past Medical History  Diagnosis Date  . Osteoporosis   . Arthritis     osteoarthritis  . GERD (gastroesophageal reflux disease)   . Hiatal hernia   . PUD (peptic ulcer disease)   . Cardiac arrhythmia   . Hyperlipidemia   . Heart failure   . Hypertension   . Scoliosis deformity of spine     history of intractable back pain  . Choledocholithiasis   . Pancolitis     Infectious vs. inflammatory  . Abdominal pain     Resolved  . Chronic anemia   . Chronic back pain   . Pancreatitis     History of  . CHF (congestive heart failure)   . COPD (chronic obstructive pulmonary disease)     oxygen dependent at  night 2LPM    Past Surgical History  Procedure Laterality Date  . Cholecystectomy  2008  . Abdominal hysterectomy  1970s  . Spine surgery  [phone]  . Neck surgery  1970s  . Spinal cord stimulator implant    . Ercp w/ sphicterotomy  02/2010  . Back surgery      x 5  . Pain pump implantation  2009    back, dilaudid and bupravacaine  . Flexible sigmoidoscopy N/A 01/24/2013    Procedure: FLEXIBLE SIGMOIDOSCOPY;  Surgeon: Missy Sabins, MD;  Location: [facility];  Service: Endoscopy;  Laterality:  N/A;    Medications Prior to Admission  Medication Sig Dispense Refill  . albuterol (PROVENTIL HFA;VENTOLIN HFA) 108 (90 BASE) MCG/ACT inhaler Inhale 1-2 puffs into the lungs 3 (three) times daily as needed for wheezing or shortness of breath. Inhale 1 puff when shortness of breath starts, the  Another puff if shortness of breath continues      . Ascorbic Acid (VITAMIN C) 500 MG tablet Take 500 mg by mouth daily.        . budesonide-formoterol (SYMBICORT) 160-4.5 MCG/ACT inhaler Inhale 2 puffs into the lungs daily as needed. For shortness of breath      . butalbital-acetaminophen-caffeine (FIORICET, ESGIC) 50-325-40 MG per tablet Take 1 tablet by mouth every 8 (eight) hours as needed for headache.      . Calcium Carbonate-Vitamin D (CALCIUM 600-D) 600-400 MG-UNIT per tablet Take 1 tablet by mouth 2 (two) times daily with a meal.        . Clotrimazole 1 % OINT Take 1 application by mouth daily as needed. Apply twice daily to corners of your mouth until healed. Then as needed. For sores      . diclofenac sodium (VOLTAREN) 1 % GEL Apply 1 application topically 4 (four) times daily as needed. Inflammation  100 g  2  . gabapentin (NEURONTIN) 100 MG capsule Take 1 capsule (100 mg total)  by mouth 2 (two) times daily.  60 capsule  3  . lactulose (CHRONULAC) 10 GM/15ML solution Take 10 g by mouth daily as needed for mild constipation. 10 ml by mouth once daily at bedtime      . nitroGLYCERIN (NITROSTAT) 0.4 MG SL tablet Place 1 tablet (0.4 mg total) under the tongue every 5 (five) minutes as needed for chest pain.  100 tablet  3  . omeprazole (PRILOSEC) 40 MG capsule Take 40 mg by mouth 2 (two) times daily.      [name] oxyCODONE-acetaminophen (PERCOCET/ROXICET) 5-325 MG per tablet Take 1 tablet by mouth every 4 (four) hours as needed for moderate pain or severe pain.      . polyethylene glycol (MIRALAX / GLYCOLAX) packet Take 17 g by mouth daily as needed for mild constipation or moderate constipation.  14 each   0    Allergies:  Allergies  Allergen Reactions  . Codeine     REACTION: n/v/d, HA  . Morphine     REACTION: n/v/d, HA  . Sulfa Antibiotics     Sick    . Zoledronic Acid     REACTION: Severe edema    Family History  Problem Relation Age of Onset  . Cancer Mother     uterine  . Heart disease Father   . Hypertension Other     Social History:  reports that she quit smoking about 25 years ago. Her smoking use included Cigarettes. She has a 12 pack-year smoking history. She has never used smokeless tobacco. She reports that she does not drink alcohol or use illicit drugs.  Review of Systems: negative except dyspnea. She uses a oxygen at night   Blood pressure 119/63, pulse 92, temperature 98.2 F (36.8 C), temperature source Oral, resp. rate 15, height $RemoveBe'5\' 1"'avwXLKfAF$  (1.549 m), weight 52.164 kg (115 lb), SpO2 98.00%. Head: Normocephalic, without obvious abnormality, atraumatic Neck: no adenopathy, no carotid bruit, no JVD, supple, symmetrical, trachea midline and thyroid not enlarged, symmetric, no tenderness/mass/nodules Resp: clear to auscultation bilaterally Cardio: regular rate and rhythm, S1, S2 normal, no murmur, click, rub or gallop GI: Abdomen soft nondistended with normoactive bowel sounds. No hepatosplenomegaly mass or guarding Extremities: extremities normal, atraumatic, no cyanosis or edema  Results for orders placed during the hospital encounter of 11/13/13 (from the past 48 hour(s))  PROTIME-INR     Status: None   Collection Time    11/13/13  5:50 PM      Result Value Ref Range   Prothrombin Time 13.4  11.6 - 15.2 seconds   INR 1.01  0.00 - 1.49  CBC     Status: None   Collection Time    11/13/13  5:50 PM      Result Value Ref Range   WBC 4.7  4.0 - 10.5 K/uL   RBC 4.25  3.87 - 5.11 MIL/uL   Hemoglobin 13.0  12.0 - 15.0 g/dL   HCT 39.8  36.0 - 46.0 %   MCV 93.6  78.0 - 100.0 fL   MCH 30.6  26.0 - 34.0 pg   MCHC 32.7  30.0 - 36.0 g/dL   RDW 13.2  11.5 - 15.5 %    Platelets 187  150 - 400 K/uL  COMPREHENSIVE METABOLIC PANEL     Status: Abnormal   Collection Time    11/13/13  5:50 PM      Result Value Ref Range   Sodium 139  137 - 147 mEq/L   Potassium 4.4  3.7 - 5.3 mEq/L   Chloride 99  96 - 112 mEq/L   CO2 30  19 - 32 mEq/L   Glucose, Bld 103 (*) 70 - 99 mg/dL   BUN 18  6 - 23 mg/dL   Creatinine, Ser 0.67  0.50 - 1.10 mg/dL   Calcium 8.8  8.4 - 10.5 mg/dL   Total Protein 6.7  6.0 - 8.3 g/dL   Albumin 3.5  3.5 - 5.2 g/dL   AST 17  0 - 37 U/L   ALT 12  0 - 35 U/L   Alkaline Phosphatase 93  39 - 117 U/L   Total Bilirubin <0.2 (*) 0.3 - 1.2 mg/dL   GFR calc non Af Amer 77 (*) >90 mL/min   GFR calc Af Amer 90 (*) >90 mL/min   Comment: (NOTE)     The eGFR has been calculated using the CKD EPI equation.     This calculation has not been validated in all clinical situations.     eGFR's persistently <90 mL/min signify possible Chronic Kidney     Disease.   Anion gap 10  5 - 15  TYPE AND SCREEN     Status: None   Collection Time    11/13/13  5:50 PM      Result Value Ref Range   ABO/RH(D) O POS     Antibody Screen NEG     Sample Expiration 11/16/2013    POC OCCULT BLOOD, ED     Status: None   Collection Time    11/13/13  6:06 PM      Result Value Ref Range   Fecal Occult Bld NEGATIVE  NEGATIVE  CBG MONITORING, ED     Status: None   Collection Time    11/14/13 12:38 AM      Result Value Ref Range   Glucose-Capillary 82  70 - 99 mg/dL  CBC     Status: Abnormal   Collection Time    11/14/13  1:53 AM      Result Value Ref Range   WBC 4.1  4.0 - 10.5 K/uL   RBC 3.88  3.87 - 5.11 MIL/uL   Hemoglobin 11.9 (*) 12.0 - 15.0 g/dL   HCT 36.6  36.0 - 46.0 %   MCV 94.3  78.0 - 100.0 fL   MCH 30.7  26.0 - 34.0 pg   MCHC 32.5  30.0 - 36.0 g/dL   RDW 13.2  11.5 - 15.5 %   Platelets 154  150 - 400 K/uL  TROPONIN I     Status: None   Collection Time    11/14/13  1:53 AM      Result Value Ref Range   Troponin I <0.30  <0.30 ng/mL   Comment:             Due to the release kinetics of cTnI,     a negative result within the first hours     of the onset of symptoms does not rule out     myocardial infarction with certainty.     If myocardial infarction is still suspected,     repeat the test at appropriate intervals.  LIPASE, BLOOD     Status: Abnormal   Collection Time    11/14/13  1:53 AM      Result Value Ref Range   Lipase 9 (*) 11 - 59 U/L  GLUCOSE, CAPILLARY     Status: Abnormal   Collection Time    11/14/13  3:01 AM      Result Value Ref Range   Glucose-Capillary 122 (*) 70 - 99 mg/dL  CBC     Status: Abnormal   Collection Time    11/14/13  4:02 AM      Result Value Ref Range   WBC 4.5  4.0 - 10.5 K/uL   RBC 3.83 (*) 3.87 - 5.11 MIL/uL   Hemoglobin 11.9 (*) 12.0 - 15.0 g/dL   HCT 37.4  36.0 - 46.0 %   MCV 97.7  78.0 - 100.0 fL   MCH 31.1  26.0 - 34.0 pg   MCHC 31.8  30.0 - 36.0 g/dL   RDW 13.4  11.5 - 15.5 %   Platelets 156  150 - 400 K/uL  COMPREHENSIVE METABOLIC PANEL     Status: Abnormal   Collection Time    11/14/13  4:02 AM      Result Value Ref Range   Sodium 143  137 - 147 mEq/L   Potassium 4.2  3.7 - 5.3 mEq/L   Chloride 105  96 - 112 mEq/L   CO2 28  19 - 32 mEq/L   Glucose, Bld 121 (*) 70 - 99 mg/dL   BUN 11  6 - 23 mg/dL   Creatinine, Ser 0.63  0.50 - 1.10 mg/dL   Calcium 8.3 (*) 8.4 - 10.5 mg/dL   Total Protein 5.8 (*) 6.0 - 8.3 g/dL   Albumin 2.9 (*) 3.5 - 5.2 g/dL   AST 16  0 - 37 U/L   ALT 9  0 - 35 U/L   Alkaline Phosphatase 80  39 - 117 U/L   Total Bilirubin <0.2 (*) 0.3 - 1.2 mg/dL   GFR calc non Af Amer 79 (*) >90 mL/min   GFR calc Af Amer >90  >90 mL/min   Comment: (NOTE)     The eGFR has been calculated using the CKD EPI equation.     This calculation has not been validated in all clinical situations.     eGFR's persistently <90 mL/min signify possible Chronic Kidney     Disease.   Anion gap 10  5 - 15  MAGNESIUM     Status: None   Collection Time    11/14/13  4:02 AM       Result Value Ref Range   Magnesium 2.1  1.5 - 2.5 mg/dL  GLUCOSE, CAPILLARY     Status: Abnormal   Collection Time    11/14/13  7:47 AM      Result Value Ref Range   Glucose-Capillary 112 (*) 70 - 99 mg/dL  CBC     Status: None   Collection Time    11/14/13  8:22 AM      Result Value Ref Range   WBC 4.3  4.0 - 10.5 K/uL   RBC 3.93  3.87 - 5.11 MIL/uL   Hemoglobin 12.0  12.0 - 15.0 g/dL   HCT 37.2  36.0 - 46.0 %   MCV 94.7  78.0 - 100.0 fL   MCH 30.5  26.0 - 34.0 pg   MCHC 32.3  30.0 - 36.0 g/dL   RDW 13.2  11.5 - 15.5 %   Platelets 152  150 - 400 K/uL   Dg Chest 2 View  11/13/2013   CLINICAL DATA:  Nose bleed and headache started today initial evaluation, personal history of COPD, CHF, and hypertension  EXAM: CHEST  2 VIEW  COMPARISON:  01/21/2013  FINDINGS: Spinal stimulator leads project over the  thoracic spine, stable. Heart size and vascular pattern are normal. Aortic arch calcifications stable. Lungs clear. No effusions. Significant scoliosis stable.  IMPRESSION: No acute findings   Electronically Signed   By: Esperanza Heir M.D.   On: 11/13/2013 20:43   Ct Abdomen Pelvis W Contrast  11/14/2013   CLINICAL DATA:  Bloody emesis. History of peptic ulcer disease. Initial encounter.  EXAM: CT ABDOMEN AND PELVIS WITH CONTRAST  TECHNIQUE: Multidetector CT imaging of the abdomen and pelvis was performed using the standard protocol following bolus administration of intravenous contrast.  CONTRAST:  51mL OMNIPAQUE IOHEXOL 300 MG/ML  SOLN  COMPARISON:  01/21/2013; 10/11/2013; 03/02/2010  FINDINGS: Normal hepatic contour. Post cholecystectomy with minimal amount of pneumobilia seen within the nondependent left lobe of the liver, the sequela of prior biliary sphincterotomy. The common bile duct remains prominent, measuring 0.8 cm in diameter, again, the sequela of post cholecystectomy state (coronal image 44, series 201). The portal vein remains widely patent. No ascites.  Interval development of  a ill-defined approximately 1.8 cm hypo attenuating lesion within the dome of the right lobe of the liver (image 17, series 201) which appears to demonstrate peripheral slightly targetoid enhancement on the coronal images (coronal image 38, series 202).  There is symmetric enhancement and excretion of the bilateral kidneys. No definite renal stones this postcontrast examination. No discrete renal lesions. No urinary obstruction or perinephric stranding.  Normal appearance of the bilateral adrenal glands, pancreas and spleen.  Extensive colonic diverticulosis without evidence of diverticulitis. The appendix is not definitely identified, however there is no inflammatory change within the right lower abdominal quadrant given streak artifact from the patient's right lower quadrant spinal stimulator device. A radiopaque pill fragment is noted within the cecum. Small hiatal hernia. Incidental note is made of a small duodenal diverticulum. Bowel is otherwise normal in course and caliber without wall thickening or evidence of obstruction. No pneumoperitoneum, pneumatosis or portal venous gas.  Scattered mixed calcified and noncalcified atherosclerotic plaque within a tortuous but normal caliber abdominal aorta. The major branch vessels of the abdominal aorta appear patent on this non CTA examination.  No retroperitoneal, mesenteric, pelvic or inguinal lymphadenopathy.  Post hysterectomy. No discrete adnexal lesions. No free fluid in the pelvic cul-de-sac.  Limited visualization of the lower thorax demonstrates minimal left basilar dependent subpleural ground-glass atelectasis. No discrete focal airspace opacities. No pleural effusion.  Normal heart size. Coronary artery calcifications. No pericardial effusion.  No acute or aggressive osseous abnormalities. Moderate to severe scoliotic curvature of the thoracolumbar spine with dominant caudal component convex to the right with associated moderate to severe multilevel DDD.   Two spinal stimulator device is are present, one of which located within the subcutaneous tissues of the right lower abdomen, the other which located within the subcutaneous tissues of the left buttocks with lead tips terminating within the mid/caudal aspects of the thoracic spine.  Regional soft tissues appear normal.  IMPRESSION: 1. Extensive colonic diverticulosis without evidence of diverticulitis. Otherwise, no explanation for patient's GI bleeding. Specifically, no evidence of enteric obstruction or focal area of bowel wall thickening. 2. Interval development of an indeterminate approximately 1.8 cm lesion within the dome of the right lobe of the liver, not definitely seen on the prior examinations - differential considerations include metastatic disease and (less likely in the absence of underlying cirrhosis) hepatocellular carcinoma, though given the sequela of prior biliary sphincterotomy and targetoid enhancement on the coronal images, a developing hepatic abscess is not excluded. Clinical correlation  is advised. 3. Small hiatal hernia. 4. Coronary artery calcifications. 5. Post cholecystectomy with unchanged mild dilatation of the common bile duct and pneumobilia, the sequela of post cholecystectomy state and prior biliary sphincterotomy. These results will be called to the ordering clinician or representative by the Radiologist Assistant, and communication documented in the PACS or zVision Dashboard.   Electronically Signed   By: Sandi Mariscal M.D.   On: 11/14/2013 08:20    Assessment: GI bleeding manifested as hematemesis, suspect NSAID-induced peptic ulcer disease. Plan:  IV proton pump inhibitor and we'll proceed with endoscopy within the next 2 hours. Crytal Pensinger C 11/14/2013, 11:37 AM

## 2013-11-14 NOTE — Progress Notes (Signed)
Pt arrived to floor at 0030, accompanied by granddaughter. MD notified of pt's arrival to the unit. Pt has intrathocal pain pump to right lower abdomen.

## 2013-11-14 NOTE — Progress Notes (Addendum)
[facility] PROGRESS NOTE  [name] [name] ESP:[id number] DOB: January 18, 1926 DOA: 11/13/2013 PCP: [name], NP  Assessment/Plan: [name] [name] is a 80 y.o. female with history of COPD, chronic pancreatitis status post antrectomy, cor pulmonale was brought to the ER by patient's family after patient was found to have possible hematemesis.  Hematemesis - given the history of gastritis and use of Goody powder patient at this time will be kept n.p.o. and on Protonix drip and closely follow CBC. Dr Amedeo Plenty consulted. Patient might need endoscopy.   Abdominal pain with history of chronic pancreatitis - CT abdomen and pelvis pending. Lipase at 9.   COPD - presently not wheezing. Continue with Symbicort, Albuterol PRN.   PVC; check mg level.   History of cor pulmonale.  History of chronic pain with patient has Dilaudid pump. Patient also takes oral percocet PRN. Carefull with oversedation.   DVT prophylaxis: SCD.   Code Status: Full Code.  Family Communication: Care discussed with granddaughter who was at bedside.  Disposition Plan: remain inpatient.    Consultants:  GI, Dr Amedeo Plenty.   Procedures:  none  Antibiotics:  none  HPI/Subjective: Patient is alert in no distress. Denies abdominal pain,. She vomited again this morning blood, and clots.  No diarrhea.   Objective: Filed Vitals:   11/14/13 0630  BP: 118/56  Pulse: 93  Temp: 98 F (36.7 C)  Resp: 16    Intake/Output Summary (Last 24 hours) at 11/14/13 0801 Last data filed at 11/13/13 2358  Gross per 24 hour  Intake   1000 ml  Output      0 ml  Net   1000 ml   Filed Weights   11/13/13 1709  Weight: 52.164 kg (115 lb)    Exam:   General:  Alert, in no distress.   Cardiovascular: S 1, S 2 RRR  Respiratory: CTA  Abdomen: Bs present, soft, NT  Musculoskeletal: trace edema.   Neuro; alert in no distress. Moves all 4 extremities.   Data Reviewed: Basic Metabolic Panel:  Recent Labs Lab  11/13/13 1750 11/14/13 0402  NA 139 143  K 4.4 4.2  CL 99 105  CO2 30 28  GLUCOSE 103* 121*  BUN 18 11  CREATININE 0.67 0.63  CALCIUM 8.8 8.3*   Liver Function Tests:  Recent Labs Lab 11/13/13 1750 11/14/13 0402  AST 17 16  ALT 12 9  ALKPHOS 93 80  BILITOT <0.2* <0.2*  PROT 6.7 5.8*  ALBUMIN 3.5 2.9*    Recent Labs Lab 11/14/13 0153  LIPASE 9*   No results found for this basename: AMMONIA,  in the last 168 hours CBC:  Recent Labs Lab 11/13/13 1750 11/14/13 0153 11/14/13 0402  WBC 4.7 4.1 4.5  HGB 13.0 11.9* 11.9*  HCT 39.8 36.6 37.4  MCV 93.6 94.3 97.7  PLT 187 154 156   Cardiac Enzymes:  Recent Labs Lab 11/14/13 0153  TROPONINI <0.30   BNP (last 3 results)  Recent Labs  01/21/13 1011  PROBNP 911.1*   CBG:  Recent Labs Lab 11/14/13 0038 11/14/13 0301 11/14/13 0747  GLUCAP 82 122* 112*    No results found for this or any previous visit (from the past 240 hour(s)).   Studies: Dg Chest 2 View  11/13/2013   CLINICAL DATA:  Nose bleed and headache started today initial evaluation, personal history of COPD, CHF, and hypertension  EXAM: CHEST  2 VIEW  COMPARISON:  01/21/2013  FINDINGS: Spinal stimulator leads project over the thoracic spine, stable.  Heart size and vascular pattern are normal. Aortic arch calcifications stable. Lungs clear. No effusions. Significant scoliosis stable.  IMPRESSION: No acute findings   Electronically Signed   By: Skipper Cliche M.D.   On: 11/13/2013 20:43    Scheduled Meds: . budesonide-formoterol  2 puff Inhalation BID  . gabapentin  100 mg Oral BID  . [START ON 11/17/2013] pantoprazole (PROTONIX) IV  40 mg Intravenous Q12H  . sodium chloride  3 mL Intravenous Q12H   Continuous Infusions: . sodium chloride    . dextrose 5 % and 0.9% NaCl 75 mL/hr at 11/13/13 0136  . pantoprozole (PROTONIX) infusion 8 mg/hr (11/14/13 0158)    Principal Problem:   Hematemesis Active Problems:   Chronic pulmonary heart  disease   COPD with emphysema, gold stage C.   Chronic back pain   Abdominal pain, chronic, epigastric    Time spent: 35 minutes.     Niel Hummer A  [facility] Pager [phone]. If 7PM-7AM, please contact night-coverage at [email or web address], password [facility] 11/14/2013, 8:01 AM  LOS: 1 day

## 2013-11-15 ENCOUNTER — Encounter (HOSPITAL_COMMUNITY): Payer: Self-pay | Admitting: Gastroenterology

## 2013-11-15 ENCOUNTER — Inpatient Hospital Stay (HOSPITAL_COMMUNITY): Payer: Medicare Other

## 2013-11-15 LAB — CBC WITH DIFFERENTIAL/PLATELET
Basophils Absolute: 0 10*3/uL (ref 0.0–0.1)
Basophils Relative: 0 % (ref 0–1)
Eosinophils Absolute: 0.1 10*3/uL (ref 0.0–0.7)
Eosinophils Relative: 3 % (ref 0–5)
HCT: 33.3 % — ABNORMAL LOW (ref 36.0–46.0)
Hemoglobin: 10.6 g/dL — ABNORMAL LOW (ref 12.0–15.0)
Lymphocytes Relative: 22 % (ref 12–46)
Lymphs Abs: 0.8 10*3/uL (ref 0.7–4.0)
MCH: 30.7 pg (ref 26.0–34.0)
MCHC: 31.8 g/dL (ref 30.0–36.0)
MCV: 96.5 fL (ref 78.0–100.0)
Monocytes Absolute: 0.4 10*3/uL (ref 0.1–1.0)
Monocytes Relative: 11 % (ref 3–12)
Neutro Abs: 2.3 10*3/uL (ref 1.7–7.7)
Neutrophils Relative %: 64 % (ref 43–77)
Platelets: 151 10*3/uL (ref 150–400)
RBC: 3.45 MIL/uL — ABNORMAL LOW (ref 3.87–5.11)
RDW: 13.2 % (ref 11.5–15.5)
WBC: 3.6 10*3/uL — ABNORMAL LOW (ref 4.0–10.5)

## 2013-11-15 LAB — BASIC METABOLIC PANEL
Anion gap: 7 (ref 5–15)
BUN: 9 mg/dL (ref 6–23)
CO2: 30 mEq/L (ref 19–32)
Calcium: 8 mg/dL — ABNORMAL LOW (ref 8.4–10.5)
Chloride: 100 mEq/L (ref 96–112)
Creatinine, Ser: 0.69 mg/dL (ref 0.50–1.10)
GFR calc Af Amer: 89 mL/min — ABNORMAL LOW (ref >90)
GFR calc non Af Amer: 77 mL/min — ABNORMAL LOW (ref >90)
Glucose, Bld: 89 mg/dL (ref 70–99)
Potassium: 3.8 mEq/L (ref 3.7–5.3)
Sodium: 137 mEq/L (ref 137–147)

## 2013-11-15 LAB — CBC
HCT: 37.7 % (ref 36.0–46.0)
Hemoglobin: 11.8 g/dL — ABNORMAL LOW (ref 12.0–15.0)
MCH: 30.3 pg (ref 26.0–34.0)
MCHC: 31.3 g/dL (ref 30.0–36.0)
MCV: 96.9 fL (ref 78.0–100.0)
Platelets: UNDETERMINED 10*3/uL (ref 150–400)
RBC: 3.89 MIL/uL (ref 3.87–5.11)
RDW: 13.5 % (ref 11.5–15.5)
WBC: 3.4 10*3/uL — ABNORMAL LOW (ref 4.0–10.5)

## 2013-11-15 LAB — AFP TUMOR MARKER: AFP-Tumor Marker: 1.8 ng/mL (ref <6.1)

## 2013-11-15 MED ORDER — FLUTICASONE PROPIONATE 50 MCG/ACT NA SUSP
1.0000 | Freq: Every day | NASAL | Status: DC
  Administered 2013-11-16: 1 via NASAL
  Filled 2013-11-15: qty 16

## 2013-11-15 MED ORDER — BUTALBITAL-APAP-CAFFEINE 50-325-40 MG PO TABS
1.0000 | ORAL_TABLET | Freq: Three times a day (TID) | ORAL | Status: DC | PRN
  Administered 2013-11-15 (×2): 1 via ORAL
  Filled 2013-11-15 (×2): qty 1

## 2013-11-15 NOTE — Progress Notes (Signed)
[facility] PROGRESS NOTE  Mikaili Flippin DPO:[id number] DOB: 1925/08/13 DOA: 11/13/2013 PCP: [name], NP  Assessment/Plan: [name] [name] is a 80 y.o. female with history of COPD, chronic pancreatitis status post antrectomy, cor pulmonale was brought to the ER by patient's family after patient was found to have possible hematemesis.  Hematemesis - given the history of gastritis and use of Goody powder.  -S/P endoscopy which only show antral gastritis.  -Continue with PPI.  -Advance diet as tolerated.   Interval development of an indeterminate approximately 1.8 cm lesion within the dome of the right lobe of the liver: -Per CT differential considerations include metastatic disease and (less likely in the absence of underlying cirrhosis) hepatocellular carcinoma, though given the sequela of prior biliary sphincterotomy and targetoid enhancement on the coronal images, a developing hepatic abscess is not excluded. Clinical correlation is advised. -no leukocytosis, afebrile.  -Alfa-feto protein normal at 1.8.  -will order CEA.  -Follow Blood culture.  -Patient need to follow up with Dr Amedeo Plenty after discharge.  -Discussed with radiologist need repeat CT scan in 4 week.   Headache: Check CT head.    Abdominal pain with history of chronic pancreatitis - CT abdomen and pelvis pending. Lipase at 9.   COPD - presently not wheezing. Continue with Symbicort, Albuterol PRN.   PVC; Mg level norma.   History of cor pulmonale.  History of chronic pain with patient has Dilaudid pump. Patient also takes oral percocet PRN. Carefull with oversedation.   DVT prophylaxis: SCD.   Code Status: Full Code.  Family Communication: Care discussed with granddaughter who was at bedside.  Disposition Plan: remain inpatient.    Consultants:  GI, Dr Amedeo Plenty.   Procedures:  none  Antibiotics:  none  HPI/Subjective: She cough this morning and vomited after. She is complaining of  headaches, she has history of headaches. Feels sinus congestion.   Objective: Filed Vitals:   11/15/13 0600  BP: 113/57  Pulse: 62  Temp: 97.8 F (36.6 C)  Resp: 16    Intake/Output Summary (Last 24 hours) at 11/15/13 1430 Last data filed at 11/15/13 0610  Gross per 24 hour  Intake 971.08 ml  Output    700 ml  Net 271.08 ml   Filed Weights   11/13/13 1709  Weight: 52.164 kg (115 lb)    Exam:   General:  Alert, in no distress.   Cardiovascular: S 1, S 2 RRR  Respiratory: CTA  Abdomen: Bs present, soft, NT  Musculoskeletal: trace edema.   Neuro; alert in no distress. Moves all 4 extremities.   Data Reviewed: Basic Metabolic Panel:  Recent Labs Lab 11/13/13 1750 11/14/13 0402 11/15/13 0048  NA 139 143 137  K 4.4 4.2 3.8  CL 99 105 100  CO2 30 28 30   GLUCOSE 103* 121* 89  BUN 18 11 9   CREATININE 0.67 0.63 0.69  CALCIUM 8.8 8.3* 8.0*  MG  --  2.1  --    Liver Function Tests:  Recent Labs Lab 11/13/13 1750 11/14/13 0402  AST 17 16  ALT 12 9  ALKPHOS 93 80  BILITOT <0.2* <0.2*  PROT 6.7 5.8*  ALBUMIN 3.5 2.9*    Recent Labs Lab 11/14/13 0153  LIPASE 9*   No results found for this basename: AMMONIA,  in the last 168 hours CBC:  Recent Labs Lab 11/14/13 0402 11/14/13 0822 11/14/13 1830 11/15/13 0048 11/15/13 0842  WBC 4.5 4.3 3.8* 3.6* 3.4*  NEUTROABS  --   --   --  2.3  --   HGB 11.9* 12.0 12.2 10.6* 11.8*  HCT 37.4 37.2 37.9 33.3* 37.7  MCV 97.7 94.7 94.5 96.5 96.9  PLT 156 152 166 151 PLATELET CLUMPS NOTED ON SMEAR, UNABLE TO ESTIMATE   Cardiac Enzymes:  Recent Labs Lab 11/14/13 0153  TROPONINI <0.30   BNP (last 3 results)  Recent Labs  01/21/13 1011  PROBNP 911.1*   CBG:  Recent Labs Lab 11/14/13 0301 11/14/13 0747 11/14/13 1442 11/14/13 1734 11/14/13 2230  GLUCAP 122* 112* 86 86 120*    Recent Results (from the past 240 hour(s))  CULTURE, BLOOD (ROUTINE X 2)     Status: None   Collection Time     11/14/13 10:55 AM      Result Value Ref Range Status   Specimen Description BLOOD LEFT ANTECUBITAL   Final   Special Requests BOTTLES DRAWN AEROBIC AND ANAEROBIC 10CC   Final   Culture  Setup Time     Final   Value: 11/14/2013 14:45     Performed at [facility]   Culture     Final   Value:        BLOOD CULTURE RECEIVED NO GROWTH TO DATE CULTURE WILL BE HELD FOR 5 DAYS BEFORE ISSUING A FINAL NEGATIVE REPORT     Performed at [facility]   Report Status PENDING   Incomplete  CULTURE, BLOOD (ROUTINE X 2)     Status: None   Collection Time    11/14/13 11:04 AM      Result Value Ref Range Status   Specimen Description BLOOD LEFT HAND   Final   Special Requests BOTTLES DRAWN AEROBIC AND ANAEROBIC 10CC   Final   Culture  Setup Time     Final   Value: 11/14/2013 14:47     Performed at [facility]   Culture     Final   Value:        BLOOD CULTURE RECEIVED NO GROWTH TO DATE CULTURE WILL BE HELD FOR 5 DAYS BEFORE ISSUING A FINAL NEGATIVE REPORT     Performed at [facility]   Report Status PENDING   Incomplete     Studies: Dg Chest 2 View  11/13/2013   CLINICAL DATA:  Nose bleed and headache started today initial evaluation, personal history of COPD, CHF, and hypertension  EXAM: CHEST  2 VIEW  COMPARISON:  01/21/2013  FINDINGS: Spinal stimulator leads project over the thoracic spine, stable. Heart size and vascular pattern are normal. Aortic arch calcifications stable. Lungs clear. No effusions. Significant scoliosis stable.  IMPRESSION: No acute findings   Electronically Signed   By: Skipper Cliche M.D.   On: 11/13/2013 20:43   Ct Abdomen Pelvis W Contrast  11/14/2013   CLINICAL DATA:  Bloody emesis. History of peptic ulcer disease. Initial encounter.  EXAM: CT ABDOMEN AND PELVIS WITH CONTRAST  TECHNIQUE: Multidetector CT imaging of the abdomen and pelvis was performed using the standard protocol following bolus administration of intravenous contrast.   CONTRAST:  45mL OMNIPAQUE IOHEXOL 300 MG/ML  SOLN  COMPARISON:  01/21/2013; 10/11/2013; 03/02/2010  FINDINGS: Normal hepatic contour. Post cholecystectomy with minimal amount of pneumobilia seen within the nondependent left lobe of the liver, the sequela of prior biliary sphincterotomy. The common bile duct remains prominent, measuring 0.8 cm in diameter, again, the sequela of post cholecystectomy state (coronal image 44, series 201). The portal vein remains widely patent. No ascites.  Interval development of a ill-defined approximately 1.8 cm  hypo attenuating lesion within the dome of the right lobe of the liver (image 17, series 201) which appears to demonstrate peripheral slightly targetoid enhancement on the coronal images (coronal image 38, series 202).  There is symmetric enhancement and excretion of the bilateral kidneys. No definite renal stones this postcontrast examination. No discrete renal lesions. No urinary obstruction or perinephric stranding.  Normal appearance of the bilateral adrenal glands, pancreas and spleen.  Extensive colonic diverticulosis without evidence of diverticulitis. The appendix is not definitely identified, however there is no inflammatory change within the right lower abdominal quadrant given streak artifact from the patient's right lower quadrant spinal stimulator device. A radiopaque pill fragment is noted within the cecum. Small hiatal hernia. Incidental note is made of a small duodenal diverticulum. Bowel is otherwise normal in course and caliber without wall thickening or evidence of obstruction. No pneumoperitoneum, pneumatosis or portal venous gas.  Scattered mixed calcified and noncalcified atherosclerotic plaque within a tortuous but normal caliber abdominal aorta. The major branch vessels of the abdominal aorta appear patent on this non CTA examination.  No retroperitoneal, mesenteric, pelvic or inguinal lymphadenopathy.  Post hysterectomy. No discrete adnexal lesions. No  free fluid in the pelvic cul-de-sac.  Limited visualization of the lower thorax demonstrates minimal left basilar dependent subpleural ground-glass atelectasis. No discrete focal airspace opacities. No pleural effusion.  Normal heart size. Coronary artery calcifications. No pericardial effusion.  No acute or aggressive osseous abnormalities. Moderate to severe scoliotic curvature of the thoracolumbar spine with dominant caudal component convex to the right with associated moderate to severe multilevel DDD.  Two spinal stimulator device is are present, one of which located within the subcutaneous tissues of the right lower abdomen, the other which located within the subcutaneous tissues of the left buttocks with lead tips terminating within the mid/caudal aspects of the thoracic spine.  Regional soft tissues appear normal.  IMPRESSION: 1. Extensive colonic diverticulosis without evidence of diverticulitis. Otherwise, no explanation for patient's GI bleeding. Specifically, no evidence of enteric obstruction or focal area of bowel wall thickening. 2. Interval development of an indeterminate approximately 1.8 cm lesion within the dome of the right lobe of the liver, not definitely seen on the prior examinations - differential considerations include metastatic disease and (less likely in the absence of underlying cirrhosis) hepatocellular carcinoma, though given the sequela of prior biliary sphincterotomy and targetoid enhancement on the coronal images, a developing hepatic abscess is not excluded. Clinical correlation is advised. 3. Small hiatal hernia. 4. Coronary artery calcifications. 5. Post cholecystectomy with unchanged mild dilatation of the common bile duct and pneumobilia, the sequela of post cholecystectomy state and prior biliary sphincterotomy. These results will be called to the ordering clinician or representative by the Radiologist Assistant, and communication documented in the PACS or zVision Dashboard.    Electronically Signed   By: Sandi Mariscal M.D.   On: 11/14/2013 08:20    Scheduled Meds: . budesonide-formoterol  2 puff Inhalation BID  . fluticasone  1 spray Each Nare Daily  . gabapentin  100 mg Oral BID  . [START ON 11/17/2013] pantoprazole (PROTONIX) IV  40 mg Intravenous Q12H  . sodium chloride  3 mL Intravenous Q12H   Continuous Infusions: . pantoprozole (PROTONIX) infusion 8 mg/hr (11/15/13 1232)    Principal Problem:   Hematemesis Active Problems:   Chronic pulmonary heart disease   COPD with emphysema, gold stage C.   Chronic back pain   Abdominal pain, chronic, epigastric    Time spent: 35  minutes.     Niel Hummer A  [facility] Pager [phone]. If 7PM-7AM, please contact night-coverage at [email or web address], password [facility] 11/15/2013, 2:30 PM  LOS: 2 days

## 2013-11-15 NOTE — Care Management Note (Signed)
  Page 1 of 1   11/15/2013     10:29:12 AM CARE MANAGEMENT NOTE 11/15/2013  Patient:  [name] [name], [name] [name]   Account Number:  [id number]  Date Initiated:  11/15/2013  Documentation initiated by:  Magdalen Spatz  Subjective/Objective Assessment:     Action/Plan:   Anticipated DC Date:     Anticipated DC Plan:           Choice offered to / List presented to:             Status of service:   Medicare Important Message given?  YES (If response is "NO", the following Medicare IM given date fields will be blank) Date Medicare IM given:  11/15/2013 Medicare IM given by:  Magdalen Spatz Date Additional Medicare IM given:   Additional Medicare IM given by:    Discharge Disposition:    Per UR Regulation:    If discussed at Long Length of Stay Meetings, dates discussed:    Comments:

## 2013-11-16 LAB — CBC
HCT: 32.3 % — ABNORMAL LOW (ref 36.0–46.0)
Hemoglobin: 10.4 g/dL — ABNORMAL LOW (ref 12.0–15.0)
MCH: 31.1 pg (ref 26.0–34.0)
MCHC: 32.2 g/dL (ref 30.0–36.0)
MCV: 96.7 fL (ref 78.0–100.0)
Platelets: 137 10*3/uL — ABNORMAL LOW (ref 150–400)
RBC: 3.34 MIL/uL — ABNORMAL LOW (ref 3.87–5.11)
RDW: 13.2 % (ref 11.5–15.5)
WBC: 3.2 10*3/uL — ABNORMAL LOW (ref 4.0–10.5)

## 2013-11-16 LAB — BASIC METABOLIC PANEL
Anion gap: 7 (ref 5–15)
BUN: 11 mg/dL (ref 6–23)
CO2: 31 mEq/L (ref 19–32)
Calcium: 8.3 mg/dL — ABNORMAL LOW (ref 8.4–10.5)
Chloride: 104 mEq/L (ref 96–112)
Creatinine, Ser: 0.72 mg/dL (ref 0.50–1.10)
GFR calc Af Amer: 88 mL/min — ABNORMAL LOW (ref >90)
GFR calc non Af Amer: 76 mL/min — ABNORMAL LOW (ref >90)
Glucose, Bld: 111 mg/dL — ABNORMAL HIGH (ref 70–99)
Potassium: 4.3 mEq/L (ref 3.7–5.3)
Sodium: 142 mEq/L (ref 137–147)

## 2013-11-16 LAB — CEA: CEA: 0.5 ng/mL (ref 0.0–5.0)

## 2013-11-16 MED ORDER — OMEPRAZOLE 40 MG PO CPDR: 40.0000 mg | DELAYED_RELEASE_CAPSULE | Freq: Two times a day (BID) | ORAL | Status: DC

## 2013-11-16 NOTE — Discharge Instructions (Signed)
Follow with Primary MD [name], NP in 7 days , avoid Motrin, ibuprofen, Goody powder  Get CBC, CMP, 2 view Chest X ray checked  by Primary MD next visit.    Activity: As tolerated with Full fall precautions use walker/cane & assistance as needed   Disposition Home     Diet: Heart Healthy   For Heart failure patients - Check your Weight same time everyday, if you gain over 2 pounds, or you develop in leg swelling, experience more shortness of breath or chest pain, call your Primary MD immediately. Follow Cardiac Low Salt Diet and 1.8 lit/day fluid restriction.   On your next visit with your primary care physician please Get Medicines reviewed and adjusted.   Please request your Prim.MD to go over all Hospital Tests and Procedure/Radiological results at the follow up, please get all Hospital records sent to your Prim MD by signing hospital release before you go home.   If you experience worsening of your admission symptoms, develop shortness of breath, life threatening emergency, suicidal or homicidal thoughts you must seek medical attention immediately by calling 911 or calling your MD immediately  if symptoms less severe.  You Must read complete instructions/literature along with all the possible adverse reactions/side effects for all the Medicines you take and that have been prescribed to you. Take any new Medicines after you have completely understood and accpet all the possible adverse reactions/side effects.   Do not drive, operating heavy machinery, perform activities at heights, swimming or participation in water activities or provide baby sitting services if your were admitted for syncope or siezures until you have seen by Primary MD or a Neurologist and advised to do so again.  Do not drive when taking Pain medications.    Do not take more than prescribed Pain, Sleep and Anxiety Medications  Special Instructions: If you have smoked or chewed Tobacco  in the last  2 yrs please stop smoking, stop any regular Alcohol  and or any Recreational drug use.  Wear Seat belts while driving.   Please note  You were cared for by a hospitalist during your hospital stay. If you have any questions about your discharge medications or the care you received while you were in the hospital after you are discharged, you can call the unit and asked to speak with the hospitalist on call if the hospitalist that took care of you is not available. Once you are discharged, your primary care physician will handle any further medical issues. Please note that NO REFILLS for any discharge medications will be authorized once you are discharged, as it is imperative that you return to your primary care physician (or establish a relationship with a primary care physician if you do not have one) for your aftercare needs so that they can reassess your need for medications and monitor your lab values.

## 2013-11-16 NOTE — Progress Notes (Signed)
Discharge instructions gone over, family present. Follow up appointments gone over. Prescription given. Home medications gone over. Patient told NOT to take, motrin, ibruprofen, and  goody powders. Patient is to have lab work and chest xray prior to follow up with family physician. My chart discussed. Diet and daily weights discussed. Patient verbalized understanding of instructions.

## 2013-11-16 NOTE — Evaluation (Signed)
Physical Therapy Evaluation Patient Details Name: [name] [name] MRN: [id number] DOB: 04-08-1925 Today's Date: 11/16/2013   History of Present Illness  [name] [name] is a 80 y.o. female with history of COPD, chronic pancreatitis status post antrectomy, cor pulmonale was brought to the ER by patient's family after patient was found to have possible hematemesis  Clinical Impression  Pt is scheduled for d/c home today with family. Pt's family was present during assessment. They report she is close to her baseline and they are able to provide the care she needs. Pt reported she does not use a RW at home. Pt currently has some decreased balance without the RW and I recommend using the RW at all times with mobility. Discussed this recommendation with the patient and family and they agree. She will have good family support and is ready for d/c home with her family providing supervision. Pt is d/c from acute PT services.    Follow Up Recommendations No PT follow up    Equipment Recommendations  None recommended by PT    Recommendations for Other Services       Precautions / Restrictions Precautions Precautions: Fall Restrictions Weight Bearing Restrictions: No      Mobility  Bed Mobility               General bed mobility comments: not tested  Transfers Overall transfer level: Needs assistance Equipment used: Rolling walker (2 wheeled) Transfers: Sit to/from Stand Sit to Stand: Supervision            Ambulation/Gait Ambulation/Gait assistance: Supervision Ambulation Distance (Feet): 100 Feet Assistive device: Rolling walker (2 wheeled) Gait Pattern/deviations: Step-through pattern;Decreased stride length   Gait velocity interpretation: Below normal speed for age/gender General Gait Details: Gait min assist without RW, pt acknowledged she needs the RW in the house.  Stairs            Wheelchair Mobility    Modified Rankin (Stroke Patients Only)        Balance Overall balance assessment: Needs assistance         Standing balance support: Bilateral upper extremity supported Standing balance-Leahy Scale: Good                               Pertinent Vitals/Pain Pain Assessment: Faces Pain Score: 6  Pain Location: headache Pain Intervention(s): RN gave pain meds during session    [place] expects to be discharged to:: Private residence Living Arrangements: Alone Available Help at Discharge: Family;Available PRN/intermittently Type of Home: House Home Access: Ramped entrance     Home Layout: One level Home Equipment: Walker - 2 wheels;Cane - single point;Bedside commode      Prior Function Level of Independence: Independent with assistive device(s)         Comments: Does not use RW in the house PTA.     Hand Dominance        Extremity/Trunk Assessment   Upper Extremity Assessment: Defer to OT evaluation           Lower Extremity Assessment: Overall WFL for tasks assessed      Cervical / Trunk Assessment: Other exceptions  Communication   Communication: No difficulties  Cognition Arousal/Alertness: Awake/alert Behavior During Therapy: WFL for tasks assessed/performed Overall Cognitive Status: Within Functional Limits for tasks assessed                      General Comments  Exercises        Assessment/Plan    PT Assessment Patent does not need any further PT services  PT Diagnosis Difficulty walking   PT Problem List    PT Treatment Interventions     PT Goals (Current goals can be found in the Care Plan section)      Frequency     Barriers to discharge        Co-evaluation               End of Session Equipment Utilized During Treatment: Gait belt Activity Tolerance: Patient tolerated treatment well Patient left: with family/visitor present;in bed Nurse Communication: Mobility status         Time: 1125-1150 PT Time  Calculation (min): 25 min   Charges:   PT Evaluation $Initial PT Evaluation Tier I: 1 Procedure PT Treatments $Gait Training: 8-22 mins   PT G Codes:          Lelon Mast 11/16/2013, 11:54 AM

## 2013-11-16 NOTE — Progress Notes (Signed)
[facility] Progress Note  Subjective: And no real complaints, no more vomiting tolerated regular diet last night had 2 small stools he said looked normal  Objective: Vital signs in last 24 hours: Temp:  [97.8 F (36.6 C)-98.4 F (36.9 C)] 97.8 F (36.6 C) (10/21 0447) Pulse Rate:  [88-90] 88 (10/21 0447) Resp:  [16-24] 24 (10/21 0447) BP: (111-150)/(59-65) 150/65 mmHg (10/21 0447) SpO2:  [97 %-100 %] 100 % (10/21 0447) Weight change:    PE: Alert oriented abdomen soft  Lab Results: Results for orders placed during the hospital encounter of 11/13/13 (from the past 24 hour(s))  CBC     Status: Abnormal   Collection Time    11/15/13  8:42 AM      Result Value Ref Range   WBC 3.4 (*) 4.0 - 10.5 K/uL   RBC 3.89  3.87 - 5.11 MIL/uL   Hemoglobin 11.8 (*) 12.0 - 15.0 g/dL   HCT 37.7  36.0 - 46.0 %   MCV 96.9  78.0 - 100.0 fL   MCH 30.3  26.0 - 34.0 pg   MCHC 31.3  30.0 - 36.0 g/dL   RDW 13.5  11.5 - 15.5 %   Platelets PLATELET CLUMPS NOTED ON SMEAR, UNABLE TO ESTIMATE  150 - 400 K/uL  CEA     Status: None   Collection Time    11/15/13 12:55 PM      Result Value Ref Range   CEA 0.5  0.0 - 5.0 ng/mL  CBC     Status: Abnormal   Collection Time    11/16/13  3:51 AM      Result Value Ref Range   WBC 3.2 (*) 4.0 - 10.5 K/uL   RBC 3.34 (*) 3.87 - 5.11 MIL/uL   Hemoglobin 10.4 (*) 12.0 - 15.0 g/dL   HCT 32.3 (*) 36.0 - 46.0 %   MCV 96.7  78.0 - 100.0 fL   MCH 31.1  26.0 - 34.0 pg   MCHC 32.2  30.0 - 36.0 g/dL   RDW 13.2  11.5 - 15.5 %   Platelets 137 (*) 150 - 400 K/uL  BASIC METABOLIC PANEL     Status: Abnormal   Collection Time    11/16/13  3:51 AM      Result Value Ref Range   Sodium 142  137 - 147 mEq/L   Potassium 4.3  3.7 - 5.3 mEq/L   Chloride 104  96 - 112 mEq/L   CO2 31  19 - 32 mEq/L   Glucose, Bld 111 (*) 70 - 99 mg/dL   BUN 11  6 - 23 mg/dL   Creatinine, Ser 0.72  0.50 - 1.10 mg/dL   Calcium 8.3 (*) 8.4 - 10.5 mg/dL   GFR calc non Af Amer 76  (*) >90 mL/min   GFR calc Af Amer 88 (*) >90 mL/min   Anion gap 7  5 - 15    Studies/Results: Ct Head Wo Contrast  11/15/2013   CLINICAL DATA:  Headache.  EXAM: CT HEAD WITHOUT CONTRAST  TECHNIQUE: Contiguous axial images were obtained from the base of the skull through the vertex without intravenous contrast.  COMPARISON:  01/21/2013  FINDINGS: Stable small vessel disease in the periventricular white matter. Cortical atrophy present consistent with the patient's chronologic age. The brain demonstrates no evidence of hemorrhage, infarction, edema, mass effect, extra-axial fluid collection, hydrocephalus or mass lesion. The skull is unremarkable.  IMPRESSION: Stable small vessel disease and atrophy.  No acute findings.  Electronically Signed   By: Aletta Edouard M.D.   On: 11/15/2013 20:16      Assessment: Hematemesis with minimal findings of gastritis on EGD, no melena no hemodynamic instability  Plan: Proton pump inhibitor and avoidance of NSAIDs, specifically good he powders. Okay for discharge from GI standpoint. Are not patient needs  GI followup if has primary care physician but happy to see if need be. Will sign off for now.    Sakiya Stepka C 11/16/2013, 7:03 AM

## 2013-11-16 NOTE — Discharge Summary (Signed)
[name] [name], is a 80 y.o. female  DOB [birth year]  MRN [id number].  Admission date:  11/13/2013  Admitting Physician  Rise Patience, MD  Discharge Date:  11/16/2013   Primary MD  [name], NP  Recommendations for primary care physician for things to follow:   Follow CBC closely, Please make sure patient follows with GI for questionable liver mass noted on CT scan, needs repeat CT scan in 4-5 weeks.    Admission Diagnosis  Upper GI bleeding [K92.2] Abdominal pain, chronic, epigastric [R10.13, G89.29] Pulmonary emphysema, unspecified emphysema type [J43.9]   Discharge Diagnosis  Upper GI bleeding [K92.2] Abdominal pain, chronic, epigastric [R10.13, G89.29] Pulmonary emphysema, unspecified emphysema type [J43.9]    Principal Problem:   Hematemesis Active Problems:   Chronic pulmonary heart disease   COPD with emphysema, gold stage C.   Chronic back pain   Abdominal pain, chronic, epigastric      Past Medical History  Diagnosis Date  . Osteoporosis   . Arthritis     osteoarthritis  . GERD (gastroesophageal reflux disease)   . Hiatal hernia   . PUD (peptic ulcer disease)   . Cardiac arrhythmia   . Hyperlipidemia   . Heart failure   . Hypertension   . Scoliosis deformity of spine     history of intractable back pain  . Choledocholithiasis   . Pancolitis     Infectious vs. inflammatory  . Abdominal pain     Resolved  . Chronic anemia   . Chronic back pain   . Pancreatitis     History of  . CHF (congestive heart failure)   . COPD (chronic obstructive pulmonary disease)     oxygen dependent at  night 2LPM    Past Surgical History  Procedure Laterality Date  . Cholecystectomy  2008  . Abdominal hysterectomy  1970s  . Spine surgery  [phone]  . Neck surgery  1970s  . Spinal cord  stimulator implant    . Ercp w/ sphicterotomy  02/2010  . Back surgery      x 5  . Pain pump implantation  2009    back, dilaudid and bupravacaine  . Flexible sigmoidoscopy N/A 01/24/2013    Procedure: FLEXIBLE SIGMOIDOSCOPY;  Surgeon: Missy Sabins, MD;  Location: [facility];  Service: Endoscopy;  Laterality: N/A;  . Esophagogastroduodenoscopy N/A 11/14/2013    Procedure: ESOPHAGOGASTRODUODENOSCOPY (EGD);  Surgeon: Missy Sabins, MD;  Location: [facility];  Service: Endoscopy;  Laterality: N/A;       History of present illness and  Hospital Course:     Kindly see H&P for history of present illness and admission details, please review complete Labs, Consult reports and Test reports for all details in brief  HPI     [name] [name] is a 80 y.o. female with history of COPD, chronic pancreatitis status post antrectomy, cor pulmonale was brought to the ER by patient's family after patient was found to have possible hematemesis.   Hospital Course     Hematemesis with  mild blood loss related anemia. Seen by GI underwent EGD which showed gastritis likely related to overuse of Goody powder, treated with IV PPI, no transfusion needed. Cleared by GI to go home. Must follow with GI closely. Request PCP to monitor CBC. Patient advised to quit the use of Goody powder and NSAIDs.   Liver mass noted on CT scan. Suspicious for metastatic disease. Alpha-fetoprotein and CEA normal limits. Needs repeat CT scan in 4-[redacted] weeks along with close GI followup for this mass workup.   Abdominal pain with chronic pancreatitis. Lipase at 9. She has a renal pump implant,  no acute issues.   COPD with cor pulmonale. No acute issues. Continue home regimen.      Discharge Condition: stable   Follow UP  Follow-up Information   Follow up with [name], NP. Schedule an appointment as soon as possible for a visit in 1 week. (follow up on CT results for liver abnormality)    Specialty:   Internal Medicine   Contact information:   [address] [place] [address] [phone]       Follow up with HAYES,JOHN C, MD. Schedule an appointment as soon as possible for a visit in 1 week.   Specialty:  Gastroenterology   Contact information:   [address], [place] [address] [phone]         Discharge Instructions  and  Discharge Medications      Discharge Instructions   Diet - low sodium heart healthy    Complete by:  As directed      Discharge instructions    Complete by:  As directed   Follow with Primary MD [name], NP in 7 days   Do not consume any Motrin/ibuprofen, Goody powder  Get CBC, CMP, 2 view Chest X ray checked  by Primary MD next visit.    Activity: As tolerated with Full fall precautions use walker/cane & assistance as needed   Disposition Home     Diet: Heart Healthy    For Heart failure patients - Check your Weight same time everyday, if you gain over 2 pounds, or you develop in leg swelling, experience more shortness of breath or chest pain, call your Primary MD immediately. Follow Cardiac Low Salt Diet and 1.8 lit/day fluid restriction.   On your next visit with your primary care physician please Get Medicines reviewed and adjusted.   Please request your Prim.MD to go over all Hospital Tests and Procedure/Radiological results at the follow up, please get all Hospital records sent to your Prim MD by signing hospital release before you go home.   If you experience worsening of your admission symptoms, develop shortness of breath, life threatening emergency, suicidal or homicidal thoughts you must seek medical attention immediately by calling 911 or calling your MD immediately  if symptoms less severe.  You Must read complete instructions/literature along with all the possible adverse reactions/side effects for all the Medicines you take and that have been prescribed to you. Take any new  Medicines after you have completely understood and accpet all the possible adverse reactions/side effects.   Do not drive, operating heavy machinery, perform activities at heights, swimming or participation in water activities or provide baby sitting services if your were admitted for syncope or siezures until you have seen by Primary MD or a Neurologist and advised to do so again.  Do not drive when taking Pain medications.    Do not take  more than prescribed Pain, Sleep and Anxiety Medications  Special Instructions: If you have smoked or chewed Tobacco  in the last 2 yrs please stop smoking, stop any regular Alcohol  and or any Recreational drug use.  Wear Seat belts while driving.   Please note  You were cared for by a hospitalist during your hospital stay. If you have any questions about your discharge medications or the care you received while you were in the hospital after you are discharged, you can call the unit and asked to speak with the hospitalist on call if the hospitalist that took care of you is not available. Once you are discharged, your primary care physician will handle any further medical issues. Please note that NO REFILLS for any discharge medications will be authorized once you are discharged, as it is imperative that you return to your primary care physician (or establish a relationship with a primary care physician if you do not have one) for your aftercare needs so that they can reassess your need for medications and monitor your lab values.     Increase activity slowly    Complete by:  As directed             Medication List         albuterol 108 (90 BASE) MCG/ACT inhaler  Commonly known as:  PROVENTIL HFA;VENTOLIN HFA  Inhale 1-2 puffs into the lungs 3 (three) times daily as needed for wheezing or shortness of breath. Inhale 1 puff when shortness of breath starts, the  Another puff if shortness of breath continues     budesonide-formoterol 160-4.5 MCG/ACT  inhaler  Commonly known as:  SYMBICORT  Inhale 2 puffs into the lungs daily as needed. For shortness of breath     butalbital-acetaminophen-caffeine 50-325-40 MG per tablet  Commonly known as:  FIORICET, ESGIC  Take 1 tablet by mouth every 8 (eight) hours as needed for headache.     CALCIUM 600-D 600-400 MG-UNIT per tablet  Generic drug:  Calcium Carbonate-Vitamin D  Take 1 tablet by mouth 2 (two) times daily with a meal.     Clotrimazole 1 % Oint  Take 1 application by mouth daily as needed. Apply twice daily to corners of your mouth until healed. Then as needed. For sores     diclofenac sodium 1 % Gel  Commonly known as:  VOLTAREN  Apply 1 application topically 4 (four) times daily as needed. Inflammation     gabapentin 100 MG capsule  Commonly known as:  NEURONTIN  Take 1 capsule (100 mg total) by mouth 2 (two) times daily.     lactulose 10 GM/15ML solution  Commonly known as:  CHRONULAC  Take 10 g by mouth daily as needed for mild constipation. 10 ml by mouth once daily at bedtime     nitroGLYCERIN 0.4 MG SL tablet  Commonly known as:  NITROSTAT  Place 1 tablet (0.4 mg total) under the tongue every 5 (five) minutes as needed for chest pain.     omeprazole 40 MG capsule  Commonly known as:  PRILOSEC  Take 1 capsule (40 mg total) by mouth 2 (two) times daily.     oxyCODONE-acetaminophen 5-325 MG per tablet  Commonly known as:  PERCOCET/ROXICET  Take 1 tablet by mouth every 4 (four) hours as needed for moderate pain or severe pain.     polyethylene glycol packet  Commonly known as:  MIRALAX / GLYCOLAX  Take 17 g by mouth daily as needed for mild  constipation or moderate constipation.     vitamin C 500 MG tablet  Commonly known as:  ASCORBIC ACID  Take 500 mg by mouth daily.          Diet and Activity recommendation: See Discharge Instructions above   Consults obtained - GI   Major procedures and Radiology Reports - PLEASE review detailed and final reports  for all details, in brief -    EGD ENDOSCOPIC IMPRESSION:  antral gastritis otherwise normal study, no evidence of recent GI  bleeding and no focal source of hemorrhage  RECOMMENDATIONS:  empiric PPI therapy and avoidance of goody powders.    Dg Chest 2 View  11/13/2013   CLINICAL DATA:  Nose bleed and headache started today initial evaluation, personal history of COPD, CHF, and hypertension  EXAM: CHEST  2 VIEW  COMPARISON:  01/21/2013  FINDINGS: Spinal stimulator leads project over the thoracic spine, stable. Heart size and vascular pattern are normal. Aortic arch calcifications stable. Lungs clear. No effusions. Significant scoliosis stable.  IMPRESSION: No acute findings   Electronically Signed   By: Skipper Cliche M.D.   On: 11/13/2013 20:43   Ct Head Wo Contrast  11/15/2013   CLINICAL DATA:  Headache.  EXAM: CT HEAD WITHOUT CONTRAST  TECHNIQUE: Contiguous axial images were obtained from the base of the skull through the vertex without intravenous contrast.  COMPARISON:  01/21/2013  FINDINGS: Stable small vessel disease in the periventricular white matter. Cortical atrophy present consistent with the patient's chronologic age. The brain demonstrates no evidence of hemorrhage, infarction, edema, mass effect, extra-axial fluid collection, hydrocephalus or mass lesion. The skull is unremarkable.  IMPRESSION: Stable small vessel disease and atrophy.  No acute findings.   Electronically Signed   By: Aletta Edouard M.D.   On: 11/15/2013 20:16   Ct Abdomen Pelvis W Contrast  11/14/2013   CLINICAL DATA:  Bloody emesis. History of peptic ulcer disease. Initial encounter.  EXAM: CT ABDOMEN AND PELVIS WITH CONTRAST  TECHNIQUE: Multidetector CT imaging of the abdomen and pelvis was performed using the standard protocol following bolus administration of intravenous contrast.  CONTRAST:  63mL OMNIPAQUE IOHEXOL 300 MG/ML  SOLN  COMPARISON:  01/21/2013; 10/11/2013; 03/02/2010  FINDINGS: Normal hepatic  contour. Post cholecystectomy with minimal amount of pneumobilia seen within the nondependent left lobe of the liver, the sequela of prior biliary sphincterotomy. The common bile duct remains prominent, measuring 0.8 cm in diameter, again, the sequela of post cholecystectomy state (coronal image 44, series 201). The portal vein remains widely patent. No ascites.  Interval development of a ill-defined approximately 1.8 cm hypo attenuating lesion within the dome of the right lobe of the liver (image 17, series 201) which appears to demonstrate peripheral slightly targetoid enhancement on the coronal images (coronal image 38, series 202).  There is symmetric enhancement and excretion of the bilateral kidneys. No definite renal stones this postcontrast examination. No discrete renal lesions. No urinary obstruction or perinephric stranding.  Normal appearance of the bilateral adrenal glands, pancreas and spleen.  Extensive colonic diverticulosis without evidence of diverticulitis. The appendix is not definitely identified, however there is no inflammatory change within the right lower abdominal quadrant given streak artifact from the patient's right lower quadrant spinal stimulator device. A radiopaque pill fragment is noted within the cecum. Small hiatal hernia. Incidental note is made of a small duodenal diverticulum. Bowel is otherwise normal in course and caliber without wall thickening or evidence of obstruction. No pneumoperitoneum, pneumatosis or portal venous gas.  Scattered mixed calcified and noncalcified atherosclerotic plaque within a tortuous but normal caliber abdominal aorta. The major branch vessels of the abdominal aorta appear patent on this non CTA examination.  No retroperitoneal, mesenteric, pelvic or inguinal lymphadenopathy.  Post hysterectomy. No discrete adnexal lesions. No free fluid in the pelvic cul-de-sac.  Limited visualization of the lower thorax demonstrates minimal left basilar dependent  subpleural ground-glass atelectasis. No discrete focal airspace opacities. No pleural effusion.  Normal heart size. Coronary artery calcifications. No pericardial effusion.  No acute or aggressive osseous abnormalities. Moderate to severe scoliotic curvature of the thoracolumbar spine with dominant caudal component convex to the right with associated moderate to severe multilevel DDD.  Two spinal stimulator device is are present, one of which located within the subcutaneous tissues of the right lower abdomen, the other which located within the subcutaneous tissues of the left buttocks with lead tips terminating within the mid/caudal aspects of the thoracic spine.  Regional soft tissues appear normal.  IMPRESSION: 1. Extensive colonic diverticulosis without evidence of diverticulitis. Otherwise, no explanation for patient's GI bleeding. Specifically, no evidence of enteric obstruction or focal area of bowel wall thickening. 2. Interval development of an indeterminate approximately 1.8 cm lesion within the dome of the right lobe of the liver, not definitely seen on the prior examinations - differential considerations include metastatic disease and (less likely in the absence of underlying cirrhosis) hepatocellular carcinoma, though given the sequela of prior biliary sphincterotomy and targetoid enhancement on the coronal images, a developing hepatic abscess is not excluded. Clinical correlation is advised. 3. Small hiatal hernia. 4. Coronary artery calcifications. 5. Post cholecystectomy with unchanged mild dilatation of the common bile duct and pneumobilia, the sequela of post cholecystectomy state and prior biliary sphincterotomy. These results will be called to the ordering clinician or representative by the Radiologist Assistant, and communication documented in the PACS or zVision Dashboard.   Electronically Signed   By: Sandi Mariscal M.D.   On: 11/14/2013 08:20    Micro Results      Recent Results (from the  past 240 hour(s))  CULTURE, BLOOD (ROUTINE X 2)     Status: None   Collection Time    11/14/13 10:55 AM      Result Value Ref Range Status   Specimen Description BLOOD LEFT ANTECUBITAL   Final   Special Requests BOTTLES DRAWN AEROBIC AND ANAEROBIC 10CC   Final   Culture  Setup Time     Final   Value: 11/14/2013 14:45     Performed at [facility]   Culture     Final   Value:        BLOOD CULTURE RECEIVED NO GROWTH TO DATE CULTURE WILL BE HELD FOR 5 DAYS BEFORE ISSUING A FINAL NEGATIVE REPORT     Performed at [facility]   Report Status PENDING   Incomplete  CULTURE, BLOOD (ROUTINE X 2)     Status: None   Collection Time    11/14/13 11:04 AM      Result Value Ref Range Status   Specimen Description BLOOD LEFT HAND   Final   Special Requests BOTTLES DRAWN AEROBIC AND ANAEROBIC 10CC   Final   Culture  Setup Time     Final   Value: 11/14/2013 14:47     Performed at [facility]   Culture     Final   Value:        BLOOD CULTURE RECEIVED NO GROWTH TO DATE CULTURE WILL  BE HELD FOR 5 DAYS BEFORE ISSUING A FINAL NEGATIVE REPORT     Performed at [facility]   Report Status PENDING   Incomplete       Today   Subjective:   [name] [name] today has no headache,no chest abdominal pain,no new weakness tingling or numbness, feels much better wants to go home today.   Objective:   Blood pressure 150/65, pulse 88, temperature 97.8 F (36.6 C), temperature source Oral, resp. rate 24, height 5\' 1"  (1.549 m), weight 52.164 kg (115 lb), SpO2 100.00%.   Intake/Output Summary (Last 24 hours) at 11/16/13 1035 Last data filed at 11/16/13 0452  Gross per 24 hour  Intake 1221.58 ml  Output      0 ml  Net 1221.58 ml    Exam Awake Alert, Oriented x 3, No new F.N deficits, Normal affect [place].AT,PERRAL Supple Neck,No JVD, No cervical lymphadenopathy appriciated.  Symmetrical Chest wall movement, Good air movement bilaterally, CTAB RRR,No Gallops,Rubs or new  Murmurs, No Parasternal Heave +ve B.Sounds, Abd Soft, Non tender, No organomegaly appriciated, No rebound -guarding or rigidity. No Cyanosis, Clubbing or edema, No new Rash or bruise  Data Review   CBC w Diff: Lab Results  Component Value Date   WBC 3.2* 11/16/2013   HGB 10.4* 11/16/2013   HCT 32.3* 11/16/2013   PLT 137* 11/16/2013   LYMPHOPCT 22 11/15/2013   MONOPCT 11 11/15/2013   EOSPCT 3 11/15/2013   BASOPCT 0 11/15/2013    CMP: Lab Results  Component Value Date   NA 142 11/16/2013   K 4.3 11/16/2013   CL 104 11/16/2013   CO2 31 11/16/2013   BUN 11 11/16/2013   CREATININE 0.72 11/16/2013   CREATININE 0.65 02/07/2013   PROT 5.8* 11/14/2013   ALBUMIN 2.9* 11/14/2013   BILITOT <0.2* 11/14/2013   ALKPHOS 80 11/14/2013   AST 16 11/14/2013   ALT 9 11/14/2013  .   Total Time in preparing paper work, data evaluation and todays exam - 35 minutes  Thurnell Lose M.D on 11/16/2013 at 10:35 AM  [facility] Office  [phone]

## 2013-11-17 ENCOUNTER — Telehealth: Payer: Self-pay

## 2013-11-17 NOTE — Telephone Encounter (Signed)
Called patient.  No answer. Unable to leave message.  No voice mailbox set up.

## 2013-11-18 NOTE — Telephone Encounter (Signed)
Called patient's home number multiple times--no answer.  Tried calling mobile number and number would not go through.  Called daughter, [name], who states that patient is currently staying with her son.  An appointment was scheduled for Thursday, October 29th at 5:30 pm with Debbrah Alar.  Daughter states that she is going to her brother's house later today and would call us back to complete TCM call.

## 2013-11-20 LAB — CULTURE, BLOOD (ROUTINE X 2)
Culture: NO GROWTH
Culture: NO GROWTH

## 2013-11-24 ENCOUNTER — Encounter: Payer: Self-pay | Admitting: Family

## 2013-11-24 ENCOUNTER — Ambulatory Visit (INDEPENDENT_AMBULATORY_CARE_PROVIDER_SITE_OTHER): Payer: Medicare Other | Admitting: Family

## 2013-11-24 VITALS — BP 139/64 | HR 87 | Temp 98.4°F | Wt 114.6 lb

## 2013-11-24 DIAGNOSIS — R042 Hemoptysis: Secondary | ICD-10-CM | POA: Diagnosis not present

## 2013-11-24 NOTE — Patient Instructions (Signed)
Please complete lab work prior to leaving. You will be contacted about scheduling your CT scan of your chest.  Follow up with Dr. Amedeo Plenty.   Follow up in 6 weeks.

## 2013-11-24 NOTE — Assessment & Plan Note (Addendum)
Pt's hx to me sounds more like hemoptysis than hematemesis.  I would like to obtain a CT of the chest to further evaluate for lung lesions. She is a former smoker. Will obtain follow up CBC.

## 2013-11-24 NOTE — Progress Notes (Signed)
Pre visit review using our clinic review tool, if applicable. No additional management support is needed unless otherwise documented below in the visit note. 

## 2013-11-24 NOTE — Progress Notes (Signed)
Subjective:    Patient ID: [name] [name], female    DOB: [birth year], 80 y.o.   MRN: [id number]  HPI  [name] [name] is an 80 yr old female who presents today for hospital follow up.   She was admitted 10/18-10/21 with hematemesis. Records are reviewed. She underwent GI evaluation and had EGD which showed antral gastritis. There was no obvious source of bleeding on EGD and she was heme negative in the ED.  Ultimately, it was felt that her bleeding was due to overuse of Goody powder. She was treated with IV PPI.  She has since discontinued goody powder. Upon further questioning today in the office, she tells me that she saw blood "after coughing." If fact she is not sure if she vomited or coughed up the blood. CXR was negative in the ED.  Since returning home she denies any further visualization of blood.   Of note, she was found to have a liver mass on CT scan which was felt to be suspicious for metastatic disease. Her GI doctor is Dr. Teena Irani and it was recommended that she have a follow up CT scan with GI in 4-5 weeks.    Review of Systems See HPI  Past Medical History  Diagnosis Date  . Osteoporosis   . Arthritis     osteoarthritis  . GERD (gastroesophageal reflux disease)   . Hiatal hernia   . PUD (peptic ulcer disease)   . Cardiac arrhythmia   . Hyperlipidemia   . Heart failure   . Hypertension   . Scoliosis deformity of spine     history of intractable back pain  . Choledocholithiasis   . Pancolitis     Infectious vs. inflammatory  . Abdominal pain     Resolved  . Chronic anemia   . Chronic back pain   . Pancreatitis     History of  . CHF (congestive heart failure)   . COPD (chronic obstructive pulmonary disease)     oxygen dependent at  night 2LPM    History   Social History  . Marital Status: Widowed    Spouse Name: N/A    Number of Children: 3  . Years of Education: N/A   Occupational History  .     Social History Main Topics  . Smoking status: Former  Smoker -- 1.00 packs/day for 12 years    Types: Cigarettes    Quit date: 01/28/1988  . Smokeless tobacco: Never Used  . Alcohol Use: No  . Drug Use: No  . Sexual Activity: No   Other Topics Concern  . Not on file   Social History Narrative   1 grandchild at care link--[name] [name]   Lives with great-granddaughter    Past Surgical History  Procedure Laterality Date  . Cholecystectomy  2008  . Abdominal hysterectomy  1970s  . Spine surgery  [phone]  . Neck surgery  1970s  . Spinal cord stimulator implant    . Ercp w/ sphicterotomy  02/2010  . Back surgery      x 5  . Pain pump implantation  2009    back, dilaudid and bupravacaine  . Flexible sigmoidoscopy N/A 01/24/2013    Procedure: FLEXIBLE SIGMOIDOSCOPY;  Surgeon: Missy Sabins, MD;  Location: [place];  Service: Endoscopy;  Laterality: N/A;  . Esophagogastroduodenoscopy N/A 11/14/2013    Procedure: ESOPHAGOGASTRODUODENOSCOPY (EGD);  Surgeon: Missy Sabins, MD;  Location: [facility];  Service: Endoscopy;  Laterality: N/A;    Family History  Problem  Relation Age of Onset  . Cancer Mother     uterine  . Heart disease Father   . Hypertension Other     Allergies  Allergen Reactions  . Codeine     REACTION: n/v/d, HA  . Morphine     REACTION: n/v/d, HA  . Sulfa Antibiotics     Sick    . Zoledronic Acid     REACTION: Severe edema    Current Outpatient Prescriptions on File Prior to Visit  Medication Sig Dispense Refill  . albuterol (PROVENTIL HFA;VENTOLIN HFA) 108 (90 BASE) MCG/ACT inhaler Inhale 1-2 puffs into the lungs 3 (three) times daily as needed for wheezing or shortness of breath. Inhale 1 puff when shortness of breath starts, the  Another puff if shortness of breath continues      . Ascorbic Acid (VITAMIN C) 500 MG tablet Take 500 mg by mouth daily.        . budesonide-formoterol (SYMBICORT) 160-4.5 MCG/ACT inhaler Inhale 2 puffs into the lungs daily as needed. For shortness of breath      .  butalbital-acetaminophen-caffeine (FIORICET, ESGIC) 50-325-40 MG per tablet Take 1 tablet by mouth every 8 (eight) hours as needed for headache.      . Calcium Carbonate-Vitamin D (CALCIUM 600-D) 600-400 MG-UNIT per tablet Take 1 tablet by mouth 2 (two) times daily with a meal.        . Clotrimazole 1 % OINT Take 1 application by mouth daily as needed. Apply twice daily to corners of your mouth until healed. Then as needed. For sores      . diclofenac sodium (VOLTAREN) 1 % GEL Apply 1 application topically 4 (four) times daily as needed. Inflammation  100 g  2  . gabapentin (NEURONTIN) 100 MG capsule Take 1 capsule (100 mg total) by mouth 2 (two) times daily.  60 capsule  3  . lactulose (CHRONULAC) 10 GM/15ML solution Take 10 g by mouth daily as needed for mild constipation. 10 ml by mouth once daily at bedtime      . nitroGLYCERIN (NITROSTAT) 0.4 MG SL tablet Place 1 tablet (0.4 mg total) under the tongue every 5 (five) minutes as needed for chest pain.  100 tablet  3  . omeprazole (PRILOSEC) 40 MG capsule Take 1 capsule (40 mg total) by mouth 2 (two) times daily.  60 capsule  3  . oxyCODONE-acetaminophen (PERCOCET/ROXICET) 5-325 MG per tablet Take 1 tablet by mouth every 4 (four) hours as needed for moderate pain or severe pain.      . polyethylene glycol (MIRALAX / GLYCOLAX) packet Take 17 g by mouth daily as needed for mild constipation or moderate constipation.  14 each  0   No current facility-administered medications on file prior to visit.    BP 139/64  Pulse 87  Temp(Src) 98.4 F (36.9 C) (Oral)  Wt 114 lb 9.6 oz (51.982 kg)  SpO2 94%       Objective:   Physical Exam  Constitutional: She is oriented to person, place, and time. She appears well-developed and well-nourished. No distress.  HENT:  Head: Normocephalic and atraumatic.  Cardiovascular: Normal rate and regular rhythm.   No murmur heard. Pulmonary/Chest: Effort normal and breath sounds normal. No respiratory distress.  She has no wheezes. She has no rales. She exhibits no tenderness.  Breast exam is normal bilaterally, no obvious masses noted  Musculoskeletal: She exhibits no edema.  Neurological: She is alert and oriented to person, place, and time.  Psychiatric: She  has a normal mood and affect. Her behavior is normal. Judgment and thought content normal.          Assessment & Plan:

## 2013-11-25 LAB — CBC WITH DIFFERENTIAL/PLATELET
Basophils Absolute: 0.1 10*3/uL (ref 0.0–0.1)
Basophils Relative: 1.3 % (ref 0.0–3.0)
Eosinophils Absolute: 0.1 10*3/uL (ref 0.0–0.7)
Eosinophils Relative: 2.4 % (ref 0.0–5.0)
HCT: 34.8 % — ABNORMAL LOW (ref 36.0–46.0)
Hemoglobin: 11.4 g/dL — ABNORMAL LOW (ref 12.0–15.0)
Lymphocytes Relative: 28.3 % (ref 12.0–46.0)
Lymphs Abs: 1.3 10*3/uL (ref 0.7–4.0)
MCHC: 32.8 g/dL (ref 30.0–36.0)
MCV: 93.2 fl (ref 78.0–100.0)
Monocytes Absolute: 0.4 10*3/uL (ref 0.1–1.0)
Monocytes Relative: 8.3 % (ref 3.0–12.0)
Neutro Abs: 2.6 10*3/uL (ref 1.4–7.7)
Neutrophils Relative %: 59.7 % (ref 43.0–77.0)
Platelets: 171 10*3/uL (ref 150.0–400.0)
RBC: 3.74 Mil/uL — ABNORMAL LOW (ref 3.87–5.11)
RDW: 14.3 % (ref 11.5–15.5)
WBC: 4.4 10*3/uL (ref 4.0–10.5)

## 2013-11-26 ENCOUNTER — Telehealth: Payer: Self-pay | Admitting: Family

## 2013-11-26 DIAGNOSIS — D649 Anemia, unspecified: Secondary | ICD-10-CM

## 2013-11-26 NOTE — Telephone Encounter (Signed)
Still mildly anemic, I would like her to complete IFOB please.

## 2013-11-28 ENCOUNTER — Ambulatory Visit (HOSPITAL_BASED_OUTPATIENT_CLINIC_OR_DEPARTMENT_OTHER): Payer: Medicare Other

## 2013-11-28 ENCOUNTER — Ambulatory Visit: Payer: Medicare Other | Admitting: Family

## 2013-11-28 NOTE — Telephone Encounter (Signed)
Yes, I still want her to complete IFOB.

## 2013-11-28 NOTE — Telephone Encounter (Addendum)
IFOB mailed to pt and future order placed.

## 2013-11-28 NOTE — Telephone Encounter (Signed)
Notified pt, she thinks this was done at her recent hospital visit?  States she had several procedures and they did not find any blood in her stool. Pt questions if she should complete test. States she has GI appt coming up this week.  If pt is to proceed with IFOB she requests that we mail it to her home.  Please advise?

## 2013-11-30 ENCOUNTER — Ambulatory Visit (HOSPITAL_BASED_OUTPATIENT_CLINIC_OR_DEPARTMENT_OTHER)
Admission: RE | Admit: 2013-11-30 | Discharge: 2013-11-30 | Disposition: A | Discharge status: Home / Self Care | Payer: Medicare Other | Source: Ambulatory Visit | Attending: Diagnostic Radiology | Admitting: Diagnostic Radiology

## 2013-11-30 ENCOUNTER — Telehealth: Payer: Self-pay | Admitting: Family

## 2013-11-30 DIAGNOSIS — R042 Hemoptysis: Secondary | ICD-10-CM | POA: Diagnosis not present

## 2013-11-30 DIAGNOSIS — R9389 Abnormal findings on diagnostic imaging of other specified body structures: Secondary | ICD-10-CM

## 2013-11-30 DIAGNOSIS — J984 Other disorders of lung: Secondary | ICD-10-CM | POA: Insufficient documentation

## 2013-11-30 NOTE — Telephone Encounter (Signed)
Please let pt know that her CT shows chronic inflammatory changes.  I would like her to meet with Pulmonology for further evaluation.

## 2013-12-01 DIAGNOSIS — R1013 Epigastric pain: Secondary | ICD-10-CM | POA: Diagnosis not present

## 2013-12-01 DIAGNOSIS — K920 Hematemesis: Secondary | ICD-10-CM | POA: Diagnosis not present

## 2013-12-01 DIAGNOSIS — R932 Abnormal findings on diagnostic imaging of liver and biliary tract: Secondary | ICD-10-CM | POA: Diagnosis not present

## 2013-12-01 NOTE — Telephone Encounter (Signed)
Spoke with daughter and gave results per NP. An appt is scheduled for pulmonology 11/9

## 2013-12-01 NOTE — Telephone Encounter (Signed)
Patient daughter [name] [name] called in regarding this. She would like results. Best # [phone]

## 2013-12-05 ENCOUNTER — Ambulatory Visit (INDEPENDENT_AMBULATORY_CARE_PROVIDER_SITE_OTHER): Payer: Medicare Other | Admitting: Critical Care Medicine

## 2013-12-05 ENCOUNTER — Encounter: Payer: Self-pay | Admitting: Critical Care Medicine

## 2013-12-05 VITALS — BP 118/66 | HR 84 | Ht 60.0 in | Wt 118.0 lb

## 2013-12-05 DIAGNOSIS — R042 Hemoptysis: Secondary | ICD-10-CM | POA: Diagnosis not present

## 2013-12-05 DIAGNOSIS — J438 Other emphysema: Secondary | ICD-10-CM

## 2013-12-05 NOTE — Progress Notes (Signed)
Subjective:    Patient ID: [name] [name], female    DOB: 11-04-1925, 80 y.o.   MRN: [id number]  HPI 12/05/2013 Chief Complaint  Patient presents with  . Yearly Follow up    DOE gradually worsening, occas chest tightness and wheezing, and cough with yellow mucus.  Admitted to [facility] Oct 2015 for hemoptysis - now resolved.  Pt denies any significant sore throat, nasal congestion or excess secretions, fever, chills, sweats, unintended weight loss, pleurtic or exertional chest pain, orthopnea PND, or leg swelling Pt denies any increase in rescue therapy over baseline, denies waking up needing it or having any early am or nocturnal exacerbations of coughing/wheezing/or dyspnea. Pt also denies any obvious fluctuation in symptoms with  weather or environmental change or other alleviating or aggravating factors      Review of Systems Constitutional:   No  weight loss, night sweats,  Fevers, chills, fatigue, lassitude. HEENT:   No headaches,  Difficulty swallowing,  Tooth/dental problems,  Sore throat,                No sneezing, itching, ear ache, nasal congestion, post nasal drip,   CV:  No chest pain,  Orthopnea, PND, swelling in lower extremities, anasarca, dizziness, palpitations  GI  No heartburn, indigestion, abdominal pain, nausea, vomiting, diarrhea, change in bowel habits, loss of appetite  Resp: Notes  shortness of breath with exertion not at rest.  No excess mucus, no productive cough,  No non-productive cough,  No coughing up of blood.  No change in color of mucus.  No wheezing.  No chest wall deformity  Skin: no rash or lesions.  GU: no dysuria, change in color of urine, no urgency or frequency.  No flank pain.  MS:  No joint pain or swelling.  No decreased range of motion.  No back pain.  Psych:  No change in mood or affect. No depression or anxiety.  No memory loss.     Objective:   Physical Exam Filed Vitals:   12/05/13 1145  BP: 118/66  Pulse: 84  Height: 5' (1.524  m)  Weight: 118 lb (53.524 kg)  SpO2: 95%    Gen: Pleasant, well-nourished, in no distress,  normal affect  ENT: No lesions,  mouth clear,  oropharynx clear, no postnasal drip  Neck: No JVD, no TMG, no carotid bruits  Lungs: No use of accessory muscles, no dullness to percussion, distant BS  Cardiovascular: RRR, heart sounds normal, no murmur or gallops, no peripheral edema  Abdomen: soft and NT, no HSM,  BS normal  Musculoskeletal: No deformities, no cyanosis or clubbing  Neuro: alert, non focal  Skin: Warm, no lesions or rashes  No results found.        Assessment & Plan:   COPD with emphysema, gold stage C. Gold C Copd recent exacerbation improved Plan No change in inhaled or maintenance medications. Return in  6 months  Hemoptysis Hemoptysis was d/t hematemesis.  D/t gastritis improved Plan Per GI   Updated Medication List Outpatient Encounter Prescriptions as of 12/05/2013  Medication Sig  . albuterol (PROVENTIL HFA;VENTOLIN HFA) 108 (90 BASE) MCG/ACT inhaler Inhale 1-2 puffs into the lungs 3 (three) times daily as needed for wheezing or shortness of breath. Inhale 1 puff when shortness of breath starts, the  Another puff if shortness of breath continues  . Ascorbic Acid (VITAMIN C) 500 MG tablet Take 500 mg by mouth daily.    . budesonide-formoterol (SYMBICORT) 160-4.5 MCG/ACT inhaler Inhale 2 puffs  into the lungs daily as needed. For shortness of breath  . butalbital-acetaminophen-caffeine (FIORICET, ESGIC) 50-325-40 MG per tablet Take 1 tablet by mouth every 8 (eight) hours as needed for headache.  . Calcium Carbonate-Vitamin D (CALCIUM 600-D) 600-400 MG-UNIT per tablet Take 1 tablet by mouth 2 (two) times daily with a meal.    . Clotrimazole 1 % OINT Take 1 application by mouth daily as needed. Apply twice daily to corners of your mouth until healed. Then as needed. For sores  . diclofenac sodium (VOLTAREN) 1 % GEL Apply 1 application topically 4 (four)  times daily as needed. Inflammation  . gabapentin (NEURONTIN) 100 MG capsule Take 1 capsule (100 mg total) by mouth 2 (two) times daily.  [name] lactulose (CHRONULAC) 10 GM/15ML solution Take 10 g by mouth daily as needed for mild constipation. 10 ml by mouth once daily at bedtime  . nitroGLYCERIN (NITROSTAT) 0.4 MG SL tablet Place 1 tablet (0.4 mg total) under the tongue every 5 (five) minutes as needed for chest pain.  [name] omeprazole (PRILOSEC) 40 MG capsule Take 1 capsule (40 mg total) by mouth 2 (two) times daily.  [name] oxyCODONE-acetaminophen (PERCOCET/ROXICET) 5-325 MG per tablet Take 1 tablet by mouth every 4 (four) hours as needed for moderate pain or severe pain.  . polyethylene glycol (MIRALAX / GLYCOLAX) packet Take 17 g by mouth daily as needed for mild constipation or moderate constipation.  [name] VITAMIN E PO Take by mouth daily.

## 2013-12-05 NOTE — Patient Instructions (Signed)
No change in medications. Return in         4 months 

## 2013-12-05 NOTE — Assessment & Plan Note (Signed)
Gold C Copd recent exacerbation improved Plan No change in inhaled or maintenance medications. Return in  6 months

## 2013-12-05 NOTE — Assessment & Plan Note (Signed)
Hemoptysis was d/t hematemesis.  D/t gastritis improved Plan Per GI

## 2013-12-06 ENCOUNTER — Telehealth: Payer: Self-pay | Admitting: Family

## 2013-12-06 DIAGNOSIS — H9190 Unspecified hearing loss, unspecified ear: Secondary | ICD-10-CM

## 2013-12-06 NOTE — Telephone Encounter (Signed)
Caller name:Deborah Pendry Relation to JO:INOMVEHM Call back [place]:  Reason for call: pt is needing a referral to Dr. Alfonse Ras, [place], to have pt's hearing check for a hearing aid. Pt has medicare and [employer].

## 2013-12-08 NOTE — Telephone Encounter (Signed)
Spoke with pt's daughter, she states decreased hearing has been a problem for years and she wants pt to have her hearing checked.  Please advise.

## 2013-12-13 DIAGNOSIS — M5136 Other intervertebral disc degeneration, lumbar region: Secondary | ICD-10-CM | POA: Diagnosis not present

## 2013-12-13 DIAGNOSIS — R51 Headache: Secondary | ICD-10-CM | POA: Diagnosis not present

## 2013-12-13 DIAGNOSIS — Z79899 Other long term (current) drug therapy: Secondary | ICD-10-CM | POA: Diagnosis not present

## 2013-12-13 DIAGNOSIS — M545 Low back pain: Secondary | ICD-10-CM | POA: Diagnosis not present

## 2013-12-13 DIAGNOSIS — G894 Chronic pain syndrome: Secondary | ICD-10-CM | POA: Diagnosis not present

## 2013-12-13 DIAGNOSIS — Z9689 Presence of other specified functional implants: Secondary | ICD-10-CM | POA: Diagnosis not present

## 2013-12-19 DIAGNOSIS — H2589 Other age-related cataract: Secondary | ICD-10-CM | POA: Diagnosis not present

## 2013-12-28 DIAGNOSIS — H903 Sensorineural hearing loss, bilateral: Secondary | ICD-10-CM | POA: Diagnosis not present

## 2014-01-05 ENCOUNTER — Ambulatory Visit: Payer: Medicare Other | Admitting: Pulmonary Disease

## 2014-01-09 ENCOUNTER — Other Ambulatory Visit: Payer: Medicare Other

## 2014-01-09 DIAGNOSIS — D649 Anemia, unspecified: Secondary | ICD-10-CM

## 2014-01-10 ENCOUNTER — Encounter: Payer: Self-pay | Admitting: Family

## 2014-01-10 LAB — FECAL OCCULT BLOOD, IMMUNOCHEMICAL: Fecal Occult Bld: NEGATIVE

## 2014-01-11 ENCOUNTER — Other Ambulatory Visit: Payer: Self-pay | Admitting: Gastroenterology

## 2014-01-11 DIAGNOSIS — R932 Abnormal findings on diagnostic imaging of liver and biliary tract: Secondary | ICD-10-CM | POA: Diagnosis not present

## 2014-01-11 DIAGNOSIS — R1013 Epigastric pain: Secondary | ICD-10-CM | POA: Diagnosis not present

## 2014-01-11 DIAGNOSIS — K769 Liver disease, unspecified: Secondary | ICD-10-CM

## 2014-01-31 ENCOUNTER — Ambulatory Visit: Payer: Medicare Other | Admitting: Family

## 2014-01-31 DIAGNOSIS — M545 Low back pain: Secondary | ICD-10-CM | POA: Diagnosis not present

## 2014-01-31 DIAGNOSIS — Z79899 Other long term (current) drug therapy: Secondary | ICD-10-CM | POA: Diagnosis not present

## 2014-01-31 DIAGNOSIS — M5136 Other intervertebral disc degeneration, lumbar region: Secondary | ICD-10-CM | POA: Diagnosis not present

## 2014-01-31 DIAGNOSIS — Z9689 Presence of other specified functional implants: Secondary | ICD-10-CM | POA: Diagnosis not present

## 2014-01-31 DIAGNOSIS — R51 Headache: Secondary | ICD-10-CM | POA: Diagnosis not present

## 2014-02-20 ENCOUNTER — Inpatient Hospital Stay: Admission: RE | Admit: 2014-02-20 | Payer: Medicare Other | Source: Ambulatory Visit

## 2014-02-28 ENCOUNTER — Emergency Department (HOSPITAL_COMMUNITY): Payer: Medicare Other

## 2014-02-28 ENCOUNTER — Encounter (HOSPITAL_COMMUNITY): Payer: Self-pay | Admitting: Emergency Medicine

## 2014-02-28 ENCOUNTER — Inpatient Hospital Stay (HOSPITAL_COMMUNITY)
Admission: EM | Admit: 2014-02-28 | Discharge: 2014-03-09 | DRG: 871 | Disposition: A | Discharge status: Skilled Nursing Facility | Payer: Medicare Other | Attending: Internal Medicine | Admitting: Internal Medicine

## 2014-02-28 DIAGNOSIS — E785 Hyperlipidemia, unspecified: Secondary | ICD-10-CM | POA: Diagnosis present

## 2014-02-28 DIAGNOSIS — R079 Chest pain, unspecified: Secondary | ICD-10-CM | POA: Diagnosis not present

## 2014-02-28 DIAGNOSIS — B9689 Other specified bacterial agents as the cause of diseases classified elsewhere: Secondary | ICD-10-CM | POA: Diagnosis present

## 2014-02-28 DIAGNOSIS — Z8711 Personal history of peptic ulcer disease: Secondary | ICD-10-CM

## 2014-02-28 DIAGNOSIS — K559 Vascular disorder of intestine, unspecified: Secondary | ICD-10-CM | POA: Diagnosis present

## 2014-02-28 DIAGNOSIS — Z809 Family history of malignant neoplasm, unspecified: Secondary | ICD-10-CM

## 2014-02-28 DIAGNOSIS — A4189 Other specified sepsis: Secondary | ICD-10-CM | POA: Diagnosis not present

## 2014-02-28 DIAGNOSIS — R197 Diarrhea, unspecified: Secondary | ICD-10-CM | POA: Diagnosis not present

## 2014-02-28 DIAGNOSIS — Z87891 Personal history of nicotine dependence: Secondary | ICD-10-CM

## 2014-02-28 DIAGNOSIS — B961 Klebsiella pneumoniae [K. pneumoniae] as the cause of diseases classified elsewhere: Secondary | ICD-10-CM | POA: Diagnosis present

## 2014-02-28 DIAGNOSIS — I248 Other forms of acute ischemic heart disease: Secondary | ICD-10-CM | POA: Diagnosis present

## 2014-02-28 DIAGNOSIS — N390 Urinary tract infection, site not specified: Secondary | ICD-10-CM | POA: Diagnosis not present

## 2014-02-28 DIAGNOSIS — E876 Hypokalemia: Secondary | ICD-10-CM | POA: Diagnosis present

## 2014-02-28 DIAGNOSIS — Z90710 Acquired absence of both cervix and uterus: Secondary | ICD-10-CM

## 2014-02-28 DIAGNOSIS — J439 Emphysema, unspecified: Secondary | ICD-10-CM | POA: Diagnosis not present

## 2014-02-28 DIAGNOSIS — R6521 Severe sepsis with septic shock: Secondary | ICD-10-CM | POA: Diagnosis present

## 2014-02-28 DIAGNOSIS — I509 Heart failure, unspecified: Secondary | ICD-10-CM | POA: Diagnosis not present

## 2014-02-28 DIAGNOSIS — K51919 Ulcerative colitis, unspecified with unspecified complications: Secondary | ICD-10-CM | POA: Diagnosis not present

## 2014-02-28 DIAGNOSIS — Z9049 Acquired absence of other specified parts of digestive tract: Secondary | ICD-10-CM | POA: Diagnosis present

## 2014-02-28 DIAGNOSIS — K5190 Ulcerative colitis, unspecified, without complications: Secondary | ICD-10-CM

## 2014-02-28 DIAGNOSIS — M6281 Muscle weakness (generalized): Secondary | ICD-10-CM | POA: Diagnosis not present

## 2014-02-28 DIAGNOSIS — K2970 Gastritis, unspecified, without bleeding: Secondary | ICD-10-CM | POA: Diagnosis not present

## 2014-02-28 DIAGNOSIS — I10 Essential (primary) hypertension: Secondary | ICD-10-CM | POA: Diagnosis present

## 2014-02-28 DIAGNOSIS — I480 Paroxysmal atrial fibrillation: Secondary | ICD-10-CM | POA: Diagnosis present

## 2014-02-28 DIAGNOSIS — M810 Age-related osteoporosis without current pathological fracture: Secondary | ICD-10-CM | POA: Diagnosis present

## 2014-02-28 DIAGNOSIS — D61818 Other pancytopenia: Secondary | ICD-10-CM | POA: Diagnosis present

## 2014-02-28 DIAGNOSIS — A419 Sepsis, unspecified organism: Secondary | ICD-10-CM | POA: Diagnosis present

## 2014-02-28 DIAGNOSIS — I2489 Other forms of acute ischemic heart disease: Secondary | ICD-10-CM

## 2014-02-28 DIAGNOSIS — Z79899 Other long term (current) drug therapy: Secondary | ICD-10-CM

## 2014-02-28 DIAGNOSIS — J441 Chronic obstructive pulmonary disease with (acute) exacerbation: Secondary | ICD-10-CM | POA: Diagnosis present

## 2014-02-28 DIAGNOSIS — R112 Nausea with vomiting, unspecified: Secondary | ICD-10-CM | POA: Diagnosis not present

## 2014-02-28 DIAGNOSIS — K219 Gastro-esophageal reflux disease without esophagitis: Secondary | ICD-10-CM | POA: Diagnosis present

## 2014-02-28 DIAGNOSIS — R6520 Severe sepsis without septic shock: Secondary | ICD-10-CM | POA: Diagnosis not present

## 2014-02-28 DIAGNOSIS — R933 Abnormal findings on diagnostic imaging of other parts of digestive tract: Secondary | ICD-10-CM | POA: Diagnosis not present

## 2014-02-28 DIAGNOSIS — I214 Non-ST elevation (NSTEMI) myocardial infarction: Secondary | ICD-10-CM | POA: Diagnosis not present

## 2014-02-28 DIAGNOSIS — J438 Other emphysema: Secondary | ICD-10-CM | POA: Diagnosis not present

## 2014-02-28 DIAGNOSIS — D6959 Other secondary thrombocytopenia: Secondary | ICD-10-CM | POA: Diagnosis present

## 2014-02-28 DIAGNOSIS — R509 Fever, unspecified: Secondary | ICD-10-CM | POA: Diagnosis not present

## 2014-02-28 DIAGNOSIS — I700 Atherosclerosis of aorta: Secondary | ICD-10-CM | POA: Diagnosis not present

## 2014-02-28 DIAGNOSIS — K51918 Ulcerative colitis, unspecified with other complication: Secondary | ICD-10-CM | POA: Diagnosis not present

## 2014-02-28 DIAGNOSIS — K5100 Ulcerative (chronic) pancolitis without complications: Secondary | ICD-10-CM | POA: Diagnosis present

## 2014-02-28 DIAGNOSIS — Z7982 Long term (current) use of aspirin: Secondary | ICD-10-CM

## 2014-02-28 DIAGNOSIS — I5032 Chronic diastolic (congestive) heart failure: Secondary | ICD-10-CM | POA: Diagnosis present

## 2014-02-28 DIAGNOSIS — J9601 Acute respiratory failure with hypoxia: Secondary | ICD-10-CM | POA: Diagnosis present

## 2014-02-28 DIAGNOSIS — K529 Noninfective gastroenteritis and colitis, unspecified: Secondary | ICD-10-CM | POA: Diagnosis present

## 2014-02-28 DIAGNOSIS — N10 Acute tubulo-interstitial nephritis: Secondary | ICD-10-CM | POA: Diagnosis present

## 2014-02-28 DIAGNOSIS — Z9981 Dependence on supplemental oxygen: Secondary | ICD-10-CM

## 2014-02-28 DIAGNOSIS — J189 Pneumonia, unspecified organism: Secondary | ICD-10-CM | POA: Diagnosis present

## 2014-02-28 DIAGNOSIS — N281 Cyst of kidney, acquired: Secondary | ICD-10-CM | POA: Diagnosis not present

## 2014-02-28 DIAGNOSIS — R2681 Unsteadiness on feet: Secondary | ICD-10-CM | POA: Diagnosis not present

## 2014-02-28 DIAGNOSIS — K550 Acute vascular disorders of intestine: Secondary | ICD-10-CM | POA: Diagnosis not present

## 2014-02-28 DIAGNOSIS — J449 Chronic obstructive pulmonary disease, unspecified: Secondary | ICD-10-CM | POA: Diagnosis not present

## 2014-02-28 LAB — I-STAT CG4 LACTIC ACID, ED
Lactic Acid, Venous: 4.85 mmol/L (ref 0.5–2.0)
Lactic Acid, Venous: 1.42 mmol/L (ref 0.5–2.0)

## 2014-02-28 LAB — CBC WITH DIFFERENTIAL/PLATELET
Basophils Absolute: 0 10*3/uL (ref 0.0–0.1)
Basophils Relative: 0 % (ref 0–1)
Eosinophils Absolute: 0 10*3/uL (ref 0.0–0.7)
Eosinophils Relative: 0 % (ref 0–5)
HCT: 53.3 % — ABNORMAL HIGH (ref 36.0–46.0)
Hemoglobin: 17.9 g/dL — ABNORMAL HIGH (ref 12.0–15.0)
Lymphocytes Relative: 3 % — ABNORMAL LOW (ref 12–46)
Lymphs Abs: 0.6 10*3/uL — ABNORMAL LOW (ref 0.7–4.0)
MCH: 31.5 pg (ref 26.0–34.0)
MCHC: 33.6 g/dL (ref 30.0–36.0)
MCV: 93.8 fL (ref 78.0–100.0)
Monocytes Absolute: 1.8 10*3/uL — ABNORMAL HIGH (ref 0.1–1.0)
Monocytes Relative: 10 % (ref 3–12)
Neutro Abs: 15.3 10*3/uL — ABNORMAL HIGH (ref 1.7–7.7)
Neutrophils Relative %: 87 % — ABNORMAL HIGH (ref 43–77)
Platelets: 129 10*3/uL — ABNORMAL LOW (ref 150–400)
RBC: 5.68 MIL/uL — ABNORMAL HIGH (ref 3.87–5.11)
RDW: 13.7 % (ref 11.5–15.5)
WBC: 17.7 10*3/uL — ABNORMAL HIGH (ref 4.0–10.5)

## 2014-02-28 LAB — URINALYSIS, ROUTINE W REFLEX MICROSCOPIC
Bilirubin Urine: NEGATIVE
Glucose, UA: 100 mg/dL — AB
Ketones, ur: 15 mg/dL — AB
Leukocytes, UA: NEGATIVE
Nitrite: NEGATIVE
Protein, ur: 100 mg/dL — AB
Specific Gravity, Urine: 1.014 (ref 1.005–1.030)
Urobilinogen, UA: 0.2 mg/dL (ref 0.0–1.0)
pH: 5 (ref 5.0–8.0)

## 2014-02-28 LAB — COMPREHENSIVE METABOLIC PANEL
ALT: 23 U/L (ref 0–35)
AST: 41 U/L — ABNORMAL HIGH (ref 0–37)
Albumin: 3.6 g/dL (ref 3.5–5.2)
Alkaline Phosphatase: 100 U/L (ref 39–117)
Anion gap: 14 (ref 5–15)
BUN: 15 mg/dL (ref 6–23)
CO2: 25 mmol/L (ref 19–32)
Calcium: 8.2 mg/dL — ABNORMAL LOW (ref 8.4–10.5)
Chloride: 96 mmol/L (ref 96–112)
Creatinine, Ser: 0.72 mg/dL (ref 0.50–1.10)
GFR calc Af Amer: 88 mL/min — ABNORMAL LOW (ref >90)
GFR calc non Af Amer: 76 mL/min — ABNORMAL LOW (ref >90)
Glucose, Bld: 161 mg/dL — ABNORMAL HIGH (ref 70–99)
Potassium: 3.5 mmol/L (ref 3.5–5.1)
Sodium: 135 mmol/L (ref 135–145)
Total Bilirubin: 0.7 mg/dL (ref 0.3–1.2)
Total Protein: 6.7 g/dL (ref 6.0–8.3)

## 2014-02-28 LAB — PROTIME-INR
INR: 0.97 (ref 0.00–1.49)
Prothrombin Time: 13 seconds (ref 11.6–15.2)

## 2014-02-28 LAB — URINE MICROSCOPIC-ADD ON

## 2014-02-28 LAB — LIPASE, BLOOD: Lipase: 18 U/L (ref 11–59)

## 2014-02-28 LAB — TROPONIN I
Troponin I: 0.36 ng/mL — ABNORMAL HIGH (ref <0.031)
Troponin I: 0.31 ng/mL — ABNORMAL HIGH (ref <0.031)

## 2014-02-28 MED ORDER — ACETAMINOPHEN 650 MG RE SUPP: 650.0000 mg | Freq: Four times a day (QID) | RECTAL | Status: DC | PRN

## 2014-02-28 MED ORDER — ASPIRIN 81 MG PO CHEW
324.0000 mg | CHEWABLE_TABLET | Freq: Once | ORAL | Status: AC
  Administered 2014-02-28: 324 mg via ORAL
  Filled 2014-02-28: qty 4

## 2014-02-28 MED ORDER — IOHEXOL 300 MG/ML  SOLN: 50.0000 mL | Freq: Once | INTRAMUSCULAR | Status: DC | PRN

## 2014-02-28 MED ORDER — OXYCODONE HCL 5 MG PO TABS
5.0000 mg | ORAL_TABLET | ORAL | Status: DC | PRN
  Administered 2014-03-01 (×2): 5 mg via ORAL
  Filled 2014-02-28 (×2): qty 1

## 2014-02-28 MED ORDER — SODIUM CHLORIDE 0.9 % IV BOLUS (SEPSIS)
500.0000 mL | Freq: Once | INTRAVENOUS | Status: AC
  Administered 2014-02-28: 500 mL via INTRAVENOUS

## 2014-02-28 MED ORDER — PIPERACILLIN-TAZOBACTAM 3.375 G IVPB
3.3750 g | Freq: Three times a day (TID) | INTRAVENOUS | Status: DC
  Administered 2014-03-01 – 2014-03-08 (×22): 3.375 g via INTRAVENOUS
  Filled 2014-02-28 (×20): qty 50

## 2014-02-28 MED ORDER — NITROGLYCERIN 2 % TD OINT
0.5000 [in_us] | TOPICAL_OINTMENT | Freq: Four times a day (QID) | TRANSDERMAL | Status: DC
  Filled 2014-02-28: qty 30

## 2014-02-28 MED ORDER — ONDANSETRON HCL 4 MG PO TABS: 4.0000 mg | ORAL_TABLET | Freq: Four times a day (QID) | ORAL | Status: DC | PRN

## 2014-02-28 MED ORDER — ACETAMINOPHEN 325 MG PO TABS
650.0000 mg | ORAL_TABLET | Freq: Four times a day (QID) | ORAL | Status: DC | PRN
  Administered 2014-02-28: 650 mg via ORAL
  Filled 2014-02-28: qty 2

## 2014-02-28 MED ORDER — CHLORHEXIDINE GLUCONATE 0.12 % MT SOLN
15.0000 mL | Freq: Two times a day (BID) | OROMUCOSAL | Status: DC
  Administered 2014-02-28 – 2014-03-09 (×17): 15 mL via OROMUCOSAL
  Filled 2014-02-28 (×19): qty 15

## 2014-02-28 MED ORDER — CETYLPYRIDINIUM CHLORIDE 0.05 % MT LIQD
7.0000 mL | Freq: Two times a day (BID) | OROMUCOSAL | Status: DC
  Administered 2014-03-01 – 2014-03-03 (×5): 7 mL via OROMUCOSAL

## 2014-02-28 MED ORDER — ALBUTEROL SULFATE (2.5 MG/3ML) 0.083% IN NEBU
2.5000 mg | INHALATION_SOLUTION | Freq: Four times a day (QID) | RESPIRATORY_TRACT | Status: DC
  Administered 2014-02-28 – 2014-03-01 (×3): 2.5 mg via RESPIRATORY_TRACT
  Filled 2014-02-28 (×3): qty 3

## 2014-02-28 MED ORDER — ONDANSETRON HCL 4 MG/2ML IJ SOLN
4.0000 mg | Freq: Four times a day (QID) | INTRAMUSCULAR | Status: DC | PRN
  Administered 2014-03-01 – 2014-03-05 (×3): 4 mg via INTRAVENOUS
  Filled 2014-02-28 (×3): qty 2

## 2014-02-28 MED ORDER — VANCOMYCIN HCL 500 MG IV SOLR
500.0000 mg | INTRAVENOUS | Status: AC
  Administered 2014-02-28: 500 mg via INTRAVENOUS
  Filled 2014-02-28: qty 500

## 2014-02-28 MED ORDER — CALCIUM CARBONATE-VITAMIN D 600-400 MG-UNIT PO TABS: 1.0000 | ORAL_TABLET | Freq: Two times a day (BID) | ORAL | Status: DC

## 2014-02-28 MED ORDER — VANCOMYCIN HCL 500 MG IV SOLR
500.0000 mg | Freq: Two times a day (BID) | INTRAVENOUS | Status: DC
  Administered 2014-03-01 – 2014-03-03 (×5): 500 mg via INTRAVENOUS
  Filled 2014-02-28 (×7): qty 500

## 2014-02-28 MED ORDER — PIPERACILLIN-TAZOBACTAM 3.375 G IVPB
3.3750 g | Freq: Once | INTRAVENOUS | Status: AC
  Administered 2014-02-28: 3.375 g via INTRAVENOUS
  Filled 2014-02-28: qty 50

## 2014-02-28 MED ORDER — CALCIUM CARBONATE-VITAMIN D 500-200 MG-UNIT PO TABS
1.0000 | ORAL_TABLET | Freq: Two times a day (BID) | ORAL | Status: DC
  Administered 2014-03-01 – 2014-03-09 (×16): 1 via ORAL
  Filled 2014-02-28 (×20): qty 1

## 2014-02-28 MED ORDER — NITROGLYCERIN 0.4 MG SL SUBL
0.4000 mg | SUBLINGUAL_TABLET | SUBLINGUAL | Status: DC | PRN
  Administered 2014-03-03 (×3): 0.4 mg via SUBLINGUAL
  Filled 2014-02-28 (×2): qty 1

## 2014-02-28 MED ORDER — ACETAMINOPHEN 325 MG PO TABS
650.0000 mg | ORAL_TABLET | Freq: Four times a day (QID) | ORAL | Status: DC | PRN
  Administered 2014-03-01 – 2014-03-08 (×2): 650 mg via ORAL
  Filled 2014-02-28 (×2): qty 2

## 2014-02-28 MED ORDER — IOHEXOL 300 MG/ML  SOLN
100.0000 mL | Freq: Once | INTRAMUSCULAR | Status: AC | PRN
  Administered 2014-02-28: 100 mL via INTRAVENOUS

## 2014-02-28 MED ORDER — HEPARIN (PORCINE) IN NACL 100-0.45 UNIT/ML-% IJ SOLN
750.0000 [IU]/h | INTRAMUSCULAR | Status: DC
  Administered 2014-02-28: 600 [IU]/h via INTRAVENOUS
  Filled 2014-02-28: qty 250

## 2014-02-28 MED ORDER — SODIUM CHLORIDE 0.9 % IV SOLN
INTRAVENOUS | Status: DC
  Administered 2014-02-28 – 2014-03-02 (×3): via INTRAVENOUS
  Administered 2014-03-05: 50 mL/h via INTRAVENOUS
  Administered 2014-03-06 – 2014-03-08 (×3): via INTRAVENOUS

## 2014-02-28 MED ORDER — BOOST / RESOURCE BREEZE PO LIQD
1.0000 | Freq: Three times a day (TID) | ORAL | Status: DC
  Administered 2014-03-01 – 2014-03-04 (×5): 1 via ORAL

## 2014-02-28 MED ORDER — HYDROMORPHONE HCL 1 MG/ML IJ SOLN
0.5000 mg | INTRAMUSCULAR | Status: DC | PRN
  Administered 2014-03-02 – 2014-03-05 (×5): 1 mg via INTRAVENOUS
  Filled 2014-02-28 (×5): qty 1

## 2014-02-28 MED ORDER — ASPIRIN 325 MG PO TABS
325.0000 mg | ORAL_TABLET | Freq: Every day | ORAL | Status: DC
  Administered 2014-03-01 – 2014-03-09 (×9): 325 mg via ORAL
  Filled 2014-02-28 (×9): qty 1

## 2014-02-28 MED ORDER — PANTOPRAZOLE SODIUM 40 MG IV SOLR
40.0000 mg | Freq: Two times a day (BID) | INTRAVENOUS | Status: DC
  Administered 2014-02-28 – 2014-03-06 (×12): 40 mg via INTRAVENOUS
  Filled 2014-02-28 (×12): qty 40

## 2014-02-28 MED ORDER — HEPARIN BOLUS VIA INFUSION
3000.0000 [IU] | Freq: Once | INTRAVENOUS | Status: AC
  Administered 2014-02-28: 3000 [IU] via INTRAVENOUS
  Filled 2014-02-28: qty 3000

## 2014-02-28 MED ORDER — ALUM & MAG HYDROXIDE-SIMETH 200-200-20 MG/5ML PO SUSP: 30.0000 mL | Freq: Four times a day (QID) | ORAL | Status: DC | PRN

## 2014-02-28 MED ORDER — GABAPENTIN 100 MG PO CAPS
100.0000 mg | ORAL_CAPSULE | Freq: Two times a day (BID) | ORAL | Status: DC
  Administered 2014-02-28 – 2014-03-09 (×18): 100 mg via ORAL
  Filled 2014-02-28 (×19): qty 1

## 2014-02-28 MED ORDER — SODIUM CHLORIDE 0.9 % IV BOLUS (SEPSIS)
1000.0000 mL | INTRAVENOUS | Status: AC
  Administered 2014-02-28: 1000 mL via INTRAVENOUS

## 2014-02-28 NOTE — H&P (Signed)
[facility] Admission History and Physical       [name] [name] OAC:[id number] DOB: 08-08-1925 DOA: 02/28/2014  Referring physician:  PCP: [name], NP  Specialists:   Chief Complaint: Nausea and Vomiting and Diarrhea   HPI: [name] [name] is a 81 y.o. female with a history of COPD Gold Stage C, Chronic CHF, HTN, and Hyperlipidemia and severe Scoliosis who was brought to the ED due to increased Nausea and Vomiting and Diarrhea since last night.   She has had fevers and chills as well, she denies any hematemesis, or melena, or hematochezia.  She was found to have diffuse colitis and RLL pneumonia on CT scan.   Her family is at the bedside and report that she has had bouts of Colitis in January in the past.   She was found to have tachycardia initially  And an elevated lactate level of 4.8 and was started on the Code Sepsis Protocol and covered with IV Vancomycin and Zosyn.   She was also found to have an elevated troponin level however she denies any chest pain,  Cardiology was consulted.   She was referred for medical admission.     Review of Systems:  Constitutional: No Weight Loss, No Weight Gain, Night Sweats, +Fevers, Chills, Dizziness, Fatigue, or Generalized Weakness HEENT: No Headaches, Difficulty Swallowing,Tooth/Dental Problems,Sore Throat,  No Sneezing, Rhinitis, Ear Ache, Nasal Congestion, or Post Nasal Drip,  Cardio-vascular:  No Chest pain, Orthopnea, PND, Edema in Lower Extremities, Anasarca, Dizziness, Palpitations  Resp: No Dyspnea, No DOE, No Productive Cough, No Non-Productive Cough, No Hemoptysis, No Wheezing.    GI: No Heartburn, Indigestion, +Abdominal Pain, +Nausea, +Vomiting, +Diarrhea, Hematemesis, Hematochezia, Melena, Change in Bowel Habits,  Loss of Appetite  GU: No Dysuria, Change in Color of Urine, No Urgency or Frequency, No Flank pain.  Musculoskeletal: No Joint Pain or Swelling, No Decreased Range of Motion, No Back Pain.  Neurologic: No  Syncope, No Seizures, Muscle Weakness, Paresthesia, Vision Disturbance or Loss, No Diplopia, No Vertigo, No Difficulty Walking,  Skin: No Rash or Lesions. Psych: No Change in Mood or Affect, No Depression or Anxiety, No Memory loss, No Confusion, or Hallucinations   Past Medical History  Diagnosis Date  . Osteoporosis   . Arthritis     osteoarthritis  . GERD (gastroesophageal reflux disease)   . Hiatal hernia   . PUD (peptic ulcer disease)   . Cardiac arrhythmia   . Hyperlipidemia   . Heart failure   . Hypertension   . Scoliosis deformity of spine     history of intractable back pain  . Choledocholithiasis   . Pancolitis     Infectious vs. inflammatory  . Abdominal pain     Resolved  . Chronic anemia   . Chronic back pain   . Pancreatitis     History of  . CHF (congestive heart failure)     Diastolic  on ECHO 0109  . COPD (chronic obstructive pulmonary disease)     oxygen dependent at  night 2LPM      Past Surgical History  Procedure Laterality Date  . Cholecystectomy  2008  . Abdominal hysterectomy  1970s  . Spine surgery  [id number]  . Neck surgery  1970s  . Spinal cord stimulator implant    . Ercp w/ sphicterotomy  02/2010  . Back surgery      x 5  . Pain pump implantation  2009    back, dilaudid and bupravacaine  . Flexible sigmoidoscopy N/A 01/24/2013  Procedure: FLEXIBLE SIGMOIDOSCOPY;  Surgeon: Missy Sabins, MD;  Location: [facility];  Service: Endoscopy;  Laterality: N/A;  . Esophagogastroduodenoscopy N/A 11/14/2013    Procedure: ESOPHAGOGASTRODUODENOSCOPY (EGD);  Surgeon: Missy Sabins, MD;  Location: [facility] ENDOSCOPY;  Service: Endoscopy;  Laterality: N/A;       Prior to Admission medications   Medication Sig Start Date End Date Taking? Authorizing Provider  albuterol (PROVENTIL HFA;VENTOLIN HFA) 108 (90 BASE) MCG/ACT inhaler Inhale 1-2 puffs into the lungs 3 (three) times daily as needed for wheezing or shortness of breath (wheezing & shortness  of breath).    Yes Historical Provider, MD  Ascorbic Acid (VITAMIN C) 500 MG tablet Take 500 mg by mouth daily.     Yes Historical Provider, MD  budesonide-formoterol (SYMBICORT) 160-4.5 MCG/ACT inhaler Inhale 2 puffs into the lungs daily as needed (shortness of breath).  11/01/12  Yes Elsie Stain, MD  butalbital-acetaminophen-caffeine (FIORICET, ESGIC) [phone] MG per tablet Take 1 tablet by mouth every 8 (eight) hours as needed for headache (headache).    Yes Historical Provider, MD  Calcium Carbonate-Vitamin D (CALCIUM 600-D) 600-400 MG-UNIT per tablet Take 1 tablet by mouth 2 (two) times daily with a meal.     Yes Historical Provider, MD  Clotrimazole 1 % OINT Take 1 application by mouth 2 (two) times daily as needed (mouth sores).  10/31/13  Yes Debbrah Alar, NP  diclofenac sodium (VOLTAREN) 1 % GEL Apply 1 application topically 4 (four) times daily as needed. Inflammation 09/15/12  Yes Debbrah Alar, NP  gabapentin (NEURONTIN) 100 MG capsule Take 1 capsule (100 mg total) by mouth 2 (two) times daily. 08/23/13  Yes Debbrah Alar, NP  lactulose (CHRONULAC) 10 GM/15ML solution Take 10 g by mouth daily as needed for mild constipation (constipation). 10 ml by mouth once daily at bedtime 03/30/13  Yes Debbrah Alar, NP  omeprazole (PRILOSEC) 40 MG capsule Take 1 capsule (40 mg total) by mouth 2 (two) times daily. 11/16/13  Yes Thurnell Lose, MD  oxyCODONE-acetaminophen (PERCOCET/ROXICET) 5-325 MG per tablet Take 1 tablet by mouth every 6 (six) hours as needed for moderate pain or severe pain (pain).    Yes Historical Provider, MD  VITAMIN E PO Take 1 tablet by mouth daily.    Yes Historical Provider, MD  nitroGLYCERIN (NITROSTAT) 0.4 MG SL tablet Place 1 tablet (0.4 mg total) under the tongue every 5 (five) minutes as needed for chest pain. 11/11/10   Darlin Coco, MD  polyethylene glycol [facility] / GLYCOLAX) packet Take 17 g by mouth daily as needed for mild constipation or  moderate constipation. 01/27/13   Donne Hazel, MD      Allergies  Allergen Reactions  . Codeine     REACTION: n/v/d, HA  . Morphine     REACTION: n/v/d, HA  . Sulfa Antibiotics     Sick    . Zoledronic Acid     REACTION: Severe edema     Social History:  Very Independent, Lives Alone, Performs all of her ADL, even cuts her own grass on her [employer].     reports that she quit smoking about 26 years ago. Her smoking use included Cigarettes. She has a 12 pack-year smoking history. She has never used smokeless tobacco. She reports that she does not drink alcohol or use illicit drugs.     Family History  Problem Relation Age of Onset  . Cancer Mother     uterine  . Heart disease Father   .  Hypertension Other        Physical Exam:  GEN:  Pleasant Obese Elderly 81 y.o. female  examined  and in no acute distress; cooperative with exam Filed Vitals:   02/28/14 2045 02/28/14 2125 02/28/14 2147 02/28/14 2151  BP: 103/56 102/53 121/51   Pulse: 100 101    Temp:    98.5 F (36.9 C)  TempSrc:    Oral  Resp: 15 18 14    Height:    5' (1.524 m)  Weight:    53.6 kg (118 lb 2.7 oz)  SpO2: 98% 97%     Blood pressure 121/51, pulse 101, temperature 98.5 F (36.9 C), temperature source Oral, resp. rate 14, height 5' (1.524 m), weight 53.6 kg (118 lb 2.7 oz), SpO2 97 %. PSYCH: She is alert and oriented x 3; does not appear anxious does not appear depressed; affect is normal HEENT: Normocephalic and Atraumatic, Mucous membranes pink; PERRLA; EOM intact; Fundi:  Benign;  No scleral icterus, Nares: Patent, Oropharynx: Clear,    Neck:  FROM, No Cervical Lymphadenopathy nor Thyromegaly or Carotid Bruit; No JVD; Breasts:: Not examined CHEST WALL: No tenderness, Severe Scoliosis CHEST: Normal respiration, clear to auscultation bilaterally HEART: Regular rate and rhythm; no murmurs rubs or gallops BACK: No kyphosis or scoliosis; No CVA tenderness ABDOMEN: Positive Bowel Sounds,   Obese, Soft Non-Tender; No Masses, No Organomegaly. Rectal Exam: Not done EXTREMITIES: No  Cyanosis, Clubbing, or Edema; No Ulcerations. Genitalia: not examined PULSES: 2+ and symmetric SKIN: Normal hydration no rash or ulceration CNS: Alert and Oriented x 3, No Focal Deficits  Vascular: pulses palpable throughout    Labs on Admission:  Basic Metabolic Panel:  Recent Labs Lab 02/28/14 1727  NA 135  K 3.5  CL 96  CO2 25  GLUCOSE 161*  BUN 15  CREATININE 0.72  CALCIUM 8.2*   Liver Function Tests:  Recent Labs Lab 02/28/14 1727  AST 41*  ALT 23  ALKPHOS 100  BILITOT 0.7  PROT 6.7  ALBUMIN 3.6    Recent Labs Lab 02/28/14 1727  LIPASE 18   No results for input(s): AMMONIA in the last 168 hours. CBC:  Recent Labs Lab 02/28/14 1727  WBC 17.7*  NEUTROABS 15.3*  HGB 17.9*  HCT 53.3*  MCV 93.8  PLT 129*   Cardiac Enzymes:  Recent Labs Lab 02/28/14 1727 02/28/14 2103  TROPONINI 0.36* 0.31*    BNP (last 3 results) No results for input(s): BNP in the last 8760 hours.  ProBNP (last 3 results) No results for input(s): PROBNP in the last 8760 hours.  CBG: No results for input(s): GLUCAP in the last 168 hours.  Radiological Exams on Admission: Ct Abdomen Pelvis W Contrast  02/28/2014   CLINICAL DATA:  Acute onset of generalized weakness. Abdominal pain, nausea and vomiting. Initial encounter.  EXAM: CT ABDOMEN AND PELVIS WITH CONTRAST  TECHNIQUE: Multidetector CT imaging of the abdomen and pelvis was performed using the standard protocol following bolus administration of intravenous contrast.  CONTRAST:  172mL OMNIPAQUE IOHEXOL 300 MG/ML  SOLN  COMPARISON:  CT of the abdomen and pelvis from 11/14/2013  FINDINGS: Patchy right-sided airspace opacities are compatible with pneumonia.  The liver and spleen are unremarkable in appearance. The patient is status post cholecystectomy, with clips noted along the gallbladder fossa. Pneumobilia likely reflects prior  instrumentation at the duodenal ampulla.  Vague stranding about the pancreas is similar in appearance to the stranding about small bowel loops, with fluid tracking about the second segment  of the duodenum. This is thought to reflect the acute colonic process described below. Trace associated ascites is seen within the abdomen and pelvis. The adrenal glands are unremarkable in appearance.  Vaguely decreased enhancement is noted at the anterior aspect of the right kidney, of uncertain significance and slightly less apparent on delayed images. Would correlate for evidence of mild focal pyelonephritis. Mild left renal scarring is noted. Scattered small bilateral renal cysts are seen. There is no evidence of hydronephrosis. No renal or ureteral stones are identified.  There is mild edema noted with regard to the first and second segments of the duodenum, though this may be reactive in nature. Remaining visualized small bowel loops are grossly unremarkable. The stomach is within normal limits. No acute vascular abnormalities are seen. Scattered calcification is seen along the abdominal aorta and its branches. The abdominal aorta is somewhat tortuous in appearance.  The appendix is not definitely seen; there is no evidence of appendicitis.  There is diffuse wall thickening and edema noted along much of the colon, with partial sparing of the ascending colon. Surrounding soft tissue inflammation and trace fluid are seen, particularly along the transverse and sigmoid colon. Findings are compatible with acute colitis, either infectious or inflammatory in nature. The distribution suggests against ischemia.  A metallic device is noted at the right lower quadrant anterior abdominal wall. An additional thoracic spine simulation device is noted at the left flank, with a lead extending up the thoracic spine.  The bladder is mildly distended. Apparent bladder wall thickening is suspicious for cystitis, as it is new from the prior  study. The patient is status post hysterectomy. No suspicious adnexal masses are seen. No inguinal lymphadenopathy is seen.  No acute osseous abnormalities are identified. Right convex thoracolumbar scoliosis is noted. Underlying vacuum phenomenon and endplate sclerotic change are seen.  IMPRESSION: 1. Diffuse acute colitis noted, with partial sparing of the ascending colon, and surrounding soft tissue inflammation and trace fluid. This is either infectious or inflammatory in nature. 2. Bladder wall thickening is suspicious for cystitis, as it is new from the prior study. 3. Vaguely decreased enhancement at the anterior aspect of the right kidney is apparently new from the prior study, and may reflect mild focal pyelonephritis. Would correlate for associated symptoms. 4. Relatively diffuse patchy right-sided pneumonia noted. 5. Trace ascites within the abdomen and pelvis is thought to reflect the colonic process. Mild edema about the small bowel and pancreas are thought to be reactive. This is most prominent about the first and second segments of the duodenum, of uncertain significance. 6. Pneumobilia likely reflects prior instrumentation at the duodenal ampulla. 7. Scattered small bilateral renal cysts; mild left renal scarring. 8. Scattered calcification along the abdominal aorta and its branches. 9. Right convex thoracolumbar scoliosis, with underlying mild degenerative change.  These results were called by telephone at the time of interpretation on 02/28/2014 at 7:33 pm to Dr. Sherwood Gambler, who verbally acknowledged these results.   Electronically Signed   By: Garald Balding M.D.   On: 02/28/2014 19:37   Dg Chest Port 1 View  02/28/2014   CLINICAL DATA:  Fever, nausea, vomiting  EXAM: PORTABLE CHEST - 1 VIEW  COMPARISON:  Radiograph 11/13/2013  FINDINGS: Normal cardiac silhouette. There is fine bronchitic markings in the lungs. No focal infiltrate. No effusion. No pneumothorax.  IMPRESSION: No acute  cardiopulmonary process.   Electronically Signed   By: Suzy Bouchard M.D.   On: 02/28/2014 18:24  EKG: Independently reviewed. Sinus Tachycardia rate 153,  Without Acute S-T Changes ,   +RAD      Repeat EKG:   Sinus Tachycardia at 102 without Acute S-T changes    Assessment/Plan:   81 y.o. female with  Active Problems:   1.   Pancolitis- on Ct Scan of ABD /Pelvis   IV Vancomycin and Zosyn   Gentle IVFs   Clear Liquid Diet     2.   Severe sepsis   Code Sepsis Protocol   IV Vancomycin and Zosyn       3.   Pneumonia- seen on CT scan of ABD/pelvis   Covered by the Abx in # 2   Albuterol Nebs PRN   O2 PRN   Monitor O2 Sats     4.   Essential hypertension       5.   COPD with emphysema, gold stage C.   DUONebs PRN   O2 PRN   Monitor O2 sats     6.   Demand ischemia vs NSTEMI (non-ST elevated myocardial infarction)   Cardiac Monitoring   Cycle Troponins   IV Heparin Drip   ASA   Nitropaste, as BP Tolerates   2D ECHO in AM   Cards Consulted     7.   CHF (congestive heart failure)   Chronic    Cardiac Monitoring   Monitor for Decompensation      8.  DVT Prophylaxis   On IV Heparin drip for Now    Code Status:   FULL CODE    Family Communication:    Daughters and [place]son Daughter at Bedside Disposition Plan:         Time spent: [place] [facility] Pager [phone]   If [place] Please Contact the Day Rounding Team MD for [facility]  If 7PM-7AM, Please Contact Night-Floor Coverage  [email or web address] Password [email or web address] 02/28/2014, 10:38 PM     ADDENDUM:  Patient as seen and examined on 02/28/2014

## 2014-02-28 NOTE — ED Notes (Signed)
Brought in by EMS from home with c/o generalized weakness.  Pt's daughter reported that pt has been "feeling sick" for the past week and last night, she started having abdominal pain with nausea and vomiting.  Pt was given LR 500 ml and NS 200 ml by daughter (who is a paramedic) but pt has shown no improvement; pt's daughter called EMS.    Pt presents to ED weak-looking but verbally responsive.  Pt is normally independent with all ADLs.  Pt has "Dilaudid pump implant" in place.  Pt has hx of diverticulitis.

## 2014-02-28 NOTE — ED Notes (Signed)
Bed: WA21 Expected date:  Expected time:  Means of arrival:  Comments: Ems- elderly weakness

## 2014-02-28 NOTE — Progress Notes (Signed)
ANTICOAGULATION CONSULT NOTE - Initial Consult  Pharmacy Consult for Heparin Indication: chest pain/ACS  Allergies  Allergen Reactions  . Codeine     REACTION: n/v/d, HA  . Morphine     REACTION: n/v/d, HA  . Sulfa Antibiotics     Sick    . Zoledronic Acid     REACTION: Severe edema    Patient Measurements: Height: 5\' 4"  (162.6 cm) Weight: 120 lb (54.432 kg) IBW/kg (Calculated) : 54.7 Heparin Dosing Weight: 54 kg  Vital Signs: Temp: 100.7 F (38.2 C) (02/02 1710) Temp Source: Rectal (02/02 1710) BP: 102/53 mmHg (02/02 2125) Pulse Rate: 101 (02/02 2125)  Labs:  Recent Labs  02/28/14 1727  HGB 17.9*  HCT 53.3*  PLT 129*  LABPROT 13.0  INR 0.97  CREATININE 0.72  TROPONINI 0.36*    Estimated Creatinine Clearance: 43.4 mL/min (by C-G formula based on Cr of 0.72).   Medical History: Past Medical History  Diagnosis Date  . Osteoporosis   . Arthritis     osteoarthritis  . GERD (gastroesophageal reflux disease)   . Hiatal hernia   . PUD (peptic ulcer disease)   . Cardiac arrhythmia   . Hyperlipidemia   . Heart failure   . Hypertension   . Scoliosis deformity of spine     history of intractable back pain  . Choledocholithiasis   . Pancolitis     Infectious vs. inflammatory  . Abdominal pain     Resolved  . Chronic anemia   . Chronic back pain   . Pancreatitis     History of  . CHF (congestive heart failure)   . COPD (chronic obstructive pulmonary disease)     oxygen dependent at  night 2LPM    Medications:  Scheduled:  . albuterol  2.5 mg Nebulization Q6H  . [START ON 03/01/2014] aspirin  325 mg Oral Daily  . [START ON 03/01/2014] nitroGLYCERIN  0.5 inch Topical 4 times per day  . pantoprazole (PROTONIX) IV  40 mg Intravenous Q12H   Infusions:  . heparin     Followed by  . heparin    . [START ON 03/01/2014] piperacillin-tazobactam (ZOSYN)  IV    . [START ON 03/01/2014] vancomycin      Assessment:  Pt known to pharmacy from earlier dosing of  Vancomycin and Zosyn  Patient with elevated troponin   Pharmacy consulted to dose heparin for ACS/STEMI  Baseline INR obtained  Goal of Therapy:  Heparin level 0.3-0.7 units/ml Monitor platelets by anticoagulation protocol: Yes   Plan:   Add aPTT to previously drawn INR  Heparin 3000 unit IV bolus x 1 then begin infusion @ 600 units/hr  Check heparin level 8 hr after heparin started  Check heparin level & CBC daily  Clinten Howk, Toribio Harbour, PharmD 02/28/2014,9:26 PM

## 2014-02-28 NOTE — ED Provider Notes (Signed)
CSN: [id number]     Arrival date & time 02/28/14  1700 History   First MD Initiated Contact with Patient 02/28/14 1706     Chief Complaint  Patient presents with  . Abdominal Pain     (Consider location/radiation/quality/duration/timing/severity/associated sxs/prior Treatment) HPI 81 year old female presents with vomiting and abdominal pain since last night. She states she's been feeling "sick". Has not noted any fevers. Had multiple episodes of nonbloody diarrhea last night. Pain is mostly right side of her abdomen. Patient has not taken anything for the pain. States she's had this in the past but cannot tell me what it was from. Denies cough, fever, or dyspnea. Had chest pain last night, none now. Patient is lethargic but oriented, limited history due to this.  Past Medical History  Diagnosis Date  . Osteoporosis   . Arthritis     osteoarthritis  . GERD (gastroesophageal reflux disease)   . Hiatal hernia   . PUD (peptic ulcer disease)   . Cardiac arrhythmia   . Hyperlipidemia   . Heart failure   . Hypertension   . Scoliosis deformity of spine     history of intractable back pain  . Choledocholithiasis   . Pancolitis     Infectious vs. inflammatory  . Abdominal pain     Resolved  . Chronic anemia   . Chronic back pain   . Pancreatitis     History of  . CHF (congestive heart failure)   . COPD (chronic obstructive pulmonary disease)     oxygen dependent at  night 2LPM   Past Surgical History  Procedure Laterality Date  . Cholecystectomy  2008  . Abdominal hysterectomy  1970s  . Spine surgery  [phone]  . Neck surgery  1970s  . Spinal cord stimulator implant    . Ercp w/ sphicterotomy  02/2010  . Back surgery      x 5  . Pain pump implantation  2009    back, dilaudid and bupravacaine  . Flexible sigmoidoscopy N/A 01/24/2013    Procedure: FLEXIBLE SIGMOIDOSCOPY;  Surgeon: Missy Sabins, MD;  Location: [facility];  Service: Endoscopy;  Laterality: N/A;  .  Esophagogastroduodenoscopy N/A 11/14/2013    Procedure: ESOPHAGOGASTRODUODENOSCOPY (EGD);  Surgeon: Missy Sabins, MD;  Location: [facility];  Service: Endoscopy;  Laterality: N/A;   Family History  Problem Relation Age of Onset  . Cancer Mother     uterine  . Heart disease Father   . Hypertension Other    History  Substance Use Topics  . Smoking status: Former Smoker -- 1.00 packs/day for 12 years    Types: Cigarettes    Quit date: 01/28/1988  . Smokeless tobacco: Never Used  . Alcohol Use: No   OB History    No data available     Review of Systems  Constitutional: Negative for fever.  Respiratory: Negative for cough and shortness of breath.   Cardiovascular: Positive for chest pain.  Gastrointestinal: Positive for nausea, vomiting, abdominal pain and diarrhea.  Genitourinary: Negative for dysuria.  All other systems reviewed and are negative.     Allergies  Codeine; Morphine; Sulfa antibiotics; and Zoledronic acid  Home Medications   Prior to Admission medications   Medication Sig Start Date End Date Taking? Authorizing Provider  albuterol (PROVENTIL HFA;VENTOLIN HFA) 108 (90 BASE) MCG/ACT inhaler Inhale 1-2 puffs into the lungs 3 (three) times daily as needed for wheezing or shortness of breath. Inhale 1 puff when shortness of breath starts, the  Another  puff if shortness of breath continues    Historical Provider, MD  Ascorbic Acid (VITAMIN C) 500 MG tablet Take 500 mg by mouth daily.      Historical Provider, MD  budesonide-formoterol (SYMBICORT) 160-4.5 MCG/ACT inhaler Inhale 2 puffs into the lungs daily as needed. For shortness of breath 11/01/12   Elsie Stain, MD  butalbital-acetaminophen-caffeine (FIORICET, ESGIC) [phone] MG per tablet Take 1 tablet by mouth every 8 (eight) hours as needed for headache.    Historical Provider, MD  Calcium Carbonate-Vitamin D (CALCIUM 600-D) 600-400 MG-UNIT per tablet Take 1 tablet by mouth 2 (two) times daily with a meal.       Historical Provider, MD  Clotrimazole 1 % OINT Take 1 application by mouth daily as needed. Apply twice daily to corners of your mouth until healed. Then as needed. For sores 10/31/13   Debbrah Alar, NP  diclofenac sodium (VOLTAREN) 1 % GEL Apply 1 application topically 4 (four) times daily as needed. Inflammation 09/15/12   Debbrah Alar, NP  gabapentin (NEURONTIN) 100 MG capsule Take 1 capsule (100 mg total) by mouth 2 (two) times daily. 08/23/13   Debbrah Alar, NP  lactulose (CHRONULAC) 10 GM/15ML solution Take 10 g by mouth daily as needed for mild constipation. 10 ml by mouth once daily at bedtime 03/30/13   Debbrah Alar, NP  nitroGLYCERIN (NITROSTAT) 0.4 MG SL tablet Place 1 tablet (0.4 mg total) under the tongue every 5 (five) minutes as needed for chest pain. 11/11/10   Darlin Coco, MD  omeprazole (PRILOSEC) 40 MG capsule Take 1 capsule (40 mg total) by mouth 2 (two) times daily. 11/16/13   Thurnell Lose, MD  oxyCODONE-acetaminophen (PERCOCET/ROXICET) 5-325 MG per tablet Take 1 tablet by mouth every 4 (four) hours as needed for moderate pain or severe pain.    Historical Provider, MD  polyethylene glycol (MIRALAX / GLYCOLAX) packet Take 17 g by mouth daily as needed for mild constipation or moderate constipation. 01/27/13   Donne Hazel, MD  VITAMIN E PO Take by mouth daily.    Historical Provider, MD   BP 147/99 mmHg  Pulse 153  Temp(Src) 100.7 F (38.2 C) (Rectal)  Resp 32  Ht 5\' 4"  (1.626 m)  Wt 120 lb (54.432 kg)  BMI 20.59 kg/m2  SpO2 93% Physical Exam  Constitutional: She is oriented to person, place, and time. She appears well-developed and well-nourished. She appears lethargic.  HENT:  Head: Normocephalic and atraumatic.  Right Ear: External ear normal.  Left Ear: External ear normal.  Nose: Nose normal.  Mouth/Throat: Mucous membranes are dry.  Eyes: Right eye exhibits no discharge. Left eye exhibits no discharge.  Cardiovascular: Regular  rhythm and normal heart sounds.  Tachycardia present.   Pulmonary/Chest: Effort normal and breath sounds normal. She has no wheezes.  Abdominal: Soft. There is tenderness.    Mild tenderness in mid-right abdomen. No RUQ tenderness  Neurological: She is oriented to person, place, and time. She appears lethargic.  Skin: Skin is warm and dry.  Nursing note and vitals reviewed.   ED Course  Procedures (including critical care time) Labs Review Labs Reviewed  CBC WITH DIFFERENTIAL/PLATELET - Abnormal; Notable for the following:    WBC 17.7 (*)    RBC 5.68 (*)    Hemoglobin 17.9 (*)    HCT 53.3 (*)    Platelets 129 (*)    Neutrophils Relative % 87 (*)    Neutro Abs 15.3 (*)    Lymphocytes Relative 3 (*)  Lymphs Abs 0.6 (*)    Monocytes Absolute 1.8 (*)    All other components within normal limits  COMPREHENSIVE METABOLIC PANEL - Abnormal; Notable for the following:    Glucose, Bld 161 (*)    Calcium 8.2 (*)    AST 41 (*)    GFR calc non Af Amer 76 (*)    GFR calc Af Amer 88 (*)    All other components within normal limits  URINALYSIS, ROUTINE W REFLEX MICROSCOPIC - Abnormal; Notable for the following:    APPearance CLOUDY (*)    Glucose, UA 100 (*)    Hgb urine dipstick MODERATE (*)    Ketones, ur 15 (*)    Protein, ur 100 (*)    All other components within normal limits  TROPONIN I - Abnormal; Notable for the following:    Troponin I 0.36 (*)    All other components within normal limits  URINE MICROSCOPIC-ADD ON - Abnormal; Notable for the following:    Bacteria, UA MANY (*)    All other components within normal limits  TROPONIN I - Abnormal; Notable for the following:    Troponin I 0.31 (*)    All other components within normal limits  I-STAT CG4 LACTIC ACID, ED - Abnormal; Notable for the following:    Lactic Acid, Venous 4.85 (*)    All other components within normal limits  CULTURE, BLOOD (ROUTINE X 2)  CULTURE, BLOOD (ROUTINE X 2)  URINE CULTURE  MRSA PCR  SCREENING  PROTIME-INR  LIPASE, BLOOD  TROPONIN I  TROPONIN I  APTT  CBC  BASIC METABOLIC PANEL  HEPARIN LEVEL (UNFRACTIONATED)  I-STAT CG4 LACTIC ACID, ED    Imaging Review Ct Abdomen Pelvis W Contrast  02/28/2014   CLINICAL DATA:  Acute onset of generalized weakness. Abdominal pain, nausea and vomiting. Initial encounter.  EXAM: CT ABDOMEN AND PELVIS WITH CONTRAST  TECHNIQUE: Multidetector CT imaging of the abdomen and pelvis was performed using the standard protocol following bolus administration of intravenous contrast.  CONTRAST:  127mL OMNIPAQUE IOHEXOL 300 MG/ML  SOLN  COMPARISON:  CT of the abdomen and pelvis from 11/14/2013  FINDINGS: Patchy right-sided airspace opacities are compatible with pneumonia.  The liver and spleen are unremarkable in appearance. The patient is status post cholecystectomy, with clips noted along the gallbladder fossa. Pneumobilia likely reflects prior instrumentation at the duodenal ampulla.  Vague stranding about the pancreas is similar in appearance to the stranding about small bowel loops, with fluid tracking about the second segment of the duodenum. This is thought to reflect the acute colonic process described below. Trace associated ascites is seen within the abdomen and pelvis. The adrenal glands are unremarkable in appearance.  Vaguely decreased enhancement is noted at the anterior aspect of the right kidney, of uncertain significance and slightly less apparent on delayed images. Would correlate for evidence of mild focal pyelonephritis. Mild left renal scarring is noted. Scattered small bilateral renal cysts are seen. There is no evidence of hydronephrosis. No renal or ureteral stones are identified.  There is mild edema noted with regard to the first and second segments of the duodenum, though this may be reactive in nature. Remaining visualized small bowel loops are grossly unremarkable. The stomach is within normal limits. No acute vascular abnormalities  are seen. Scattered calcification is seen along the abdominal aorta and its branches. The abdominal aorta is somewhat tortuous in appearance.  The appendix is not definitely seen; there is no evidence of appendicitis.  There  is diffuse wall thickening and edema noted along much of the colon, with partial sparing of the ascending colon. Surrounding soft tissue inflammation and trace fluid are seen, particularly along the transverse and sigmoid colon. Findings are compatible with acute colitis, either infectious or inflammatory in nature. The distribution suggests against ischemia.  A metallic device is noted at the right lower quadrant anterior abdominal wall. An additional thoracic spine simulation device is noted at the left flank, with a lead extending up the thoracic spine.  The bladder is mildly distended. Apparent bladder wall thickening is suspicious for cystitis, as it is new from the prior study. The patient is status post hysterectomy. No suspicious adnexal masses are seen. No inguinal lymphadenopathy is seen.  No acute osseous abnormalities are identified. Right convex thoracolumbar scoliosis is noted. Underlying vacuum phenomenon and endplate sclerotic change are seen.  IMPRESSION: 1. Diffuse acute colitis noted, with partial sparing of the ascending colon, and surrounding soft tissue inflammation and trace fluid. This is either infectious or inflammatory in nature. 2. Bladder wall thickening is suspicious for cystitis, as it is new from the prior study. 3. Vaguely decreased enhancement at the anterior aspect of the right kidney is apparently new from the prior study, and may reflect mild focal pyelonephritis. Would correlate for associated symptoms. 4. Relatively diffuse patchy right-sided pneumonia noted. 5. Trace ascites within the abdomen and pelvis is thought to reflect the colonic process. Mild edema about the small bowel and pancreas are thought to be reactive. This is most prominent about the  first and second segments of the duodenum, of uncertain significance. 6. Pneumobilia likely reflects prior instrumentation at the duodenal ampulla. 7. Scattered small bilateral renal cysts; mild left renal scarring. 8. Scattered calcification along the abdominal aorta and its branches. 9. Right convex thoracolumbar scoliosis, with underlying mild degenerative change.  These results were called by telephone at the time of interpretation on 02/28/2014 at 7:33 pm to Dr. Sherwood Gambler, who verbally acknowledged these results.   Electronically Signed   By: Garald Balding M.D.   On: 02/28/2014 19:37   Dg Chest Port 1 View  02/28/2014   CLINICAL DATA:  Fever, nausea, vomiting  EXAM: PORTABLE CHEST - 1 VIEW  COMPARISON:  Radiograph 11/13/2013  FINDINGS: Normal cardiac silhouette. There is fine bronchitic markings in the lungs. No focal infiltrate. No effusion. No pneumothorax.  IMPRESSION: No acute cardiopulmonary process.   Electronically Signed   By: Suzy Bouchard M.D.   On: 02/28/2014 18:24     EKG Interpretation   Date/Time:  Tuesday February 28 2014 17:10:35 EST Ventricular Rate:  153 PR Interval:  122 QRS Duration: 76 QT Interval:  364 QTC Calculation: 581 R Axis:   136 Text Interpretation:  Right and left arm electrode reversal,  interpretation assumes no reversal Sinus tachycardia Right axis deviation  Minimal ST depression, inferior leads Nonspecific T abnormalities, lateral  leads rate is faster compared to Oct 2015 Confirmed by Regenia Skeeter  MD, [place]  [phone]) on 02/28/2014 5:41:14 PM        EKG Interpretation  Date/Time:  Tuesday February 28 2014 20:39:54 EST Ventricular Rate:  102 PR Interval:  143 QRS Duration: 76 QT Interval:  358 QTC Calculation: 466 R Axis:   149 Text Interpretation:  Right and left arm electrode reversal, interpretation assumes no reversal Sinus tachycardia Right axis deviation Abnormal T, consider ischemia, lateral leads ST depressions improved from earlier in  day Confirmed by Savreen Gebhardt  MD, Butch Otterson ([id number]) on 02/28/2014 11:22:01  PM       CRITICAL CARE Performed by: Sherwood Gambler T   Total critical care time: 30 minutes  Critical care time was exclusive of separately billable procedures and treating other patients.  Critical care was necessary to treat or prevent imminent or life-threatening deterioration.  Critical care was time spent personally by me on the following activities: development of treatment plan with patient and/or surrogate as well as nursing, discussions with consultants, evaluation of patient's response to treatment, examination of patient, obtaining history from patient or surrogate, ordering and performing treatments and interventions, ordering and review of laboratory studies, ordering and review of radiographic studies, pulse oximetry and re-evaluation of patient's condition.  MDM   Final diagnoses:  Severe sepsis  Colitis    Patient with evidence of severe sepsis. Has not had any hypotension. Is lethargic but able to be aroused and is not altered when awake. Patient's heart rate is very elevated at 150 but this appears to be a sinus tachycardia. She's not any current chest pain but did state she had chest pain last night. Initial troponin is mildly elevated, likely demand ischemia from her severe sepsis. She had multiple sources of possible sepsis on CT imaging, I think the most likely is colitis that has led to vomiting and possible aspiration pneumonia. Covered broadly with Vanco and Zosyn. Given fluids, careful fluid management given prior history of CHF and pulmonary edema. Lactate is down trended from over 4 to under 2. Her heart rate is coming down into the low 100s. She is much improved but still needs aggressive care and will need admission to the stepdown unit with the hospitalist.    Ephraim Hamburger, MD 02/28/14 [phone]

## 2014-02-28 NOTE — Progress Notes (Addendum)
ANTIBIOTIC CONSULT NOTE - INITIAL  Pharmacy Consult for Vancomycin & Zosyn Indication: Sepsis  Allergies  Allergen Reactions  . Codeine     REACTION: n/v/d, HA  . Morphine     REACTION: n/v/d, HA  . Sulfa Antibiotics     Sick    . Zoledronic Acid     REACTION: Severe edema    Patient Measurements: Height: 5\' 4"  (162.6 cm) Weight: 120 lb (54.432 kg) IBW/kg (Calculated) : 54.7  Vital Signs: Temp: 100.7 F (38.2 C) (02/02 1710) Temp Source: Rectal (02/02 1710) BP: 177/85 mmHg (02/02 1836) Pulse Rate: 126 (02/02 1836) Intake/Output from previous day:   Intake/Output from this shift:    Labs:  Recent Labs  02/28/14 1727  WBC 17.7*  HGB 17.9*  PLT 129*  CREATININE 0.72   Estimated Creatinine Clearance: 43.4 mL/min (by C-G formula based on Cr of 0.72). No results for input(s): VANCOTROUGH, VANCOPEAK, VANCORANDOM, GENTTROUGH, GENTPEAK, GENTRANDOM, TOBRATROUGH, TOBRAPEAK, TOBRARND, AMIKACINPEAK, AMIKACINTROU, AMIKACIN in the last 72 hours.   Microbiology: No results found for this or any previous visit (from the past 720 hour(s)).  Medical History: Past Medical History  Diagnosis Date  . Osteoporosis   . Arthritis     osteoarthritis  . GERD (gastroesophageal reflux disease)   . Hiatal hernia   . PUD (peptic ulcer disease)   . Cardiac arrhythmia   . Hyperlipidemia   . Heart failure   . Hypertension   . Scoliosis deformity of spine     history of intractable back pain  . Choledocholithiasis   . Pancolitis     Infectious vs. inflammatory  . Abdominal pain     Resolved  . Chronic anemia   . Chronic back pain   . Pancreatitis     History of  . CHF (congestive heart failure)   . COPD (chronic obstructive pulmonary disease)     oxygen dependent at  night 2LPM    Medications:  Scheduled:   Infusions:  . piperacillin-tazobactam (ZOSYN)  IV 3.375 g (02/28/14 1826)  . sodium chloride 1,000 mL (02/28/14 1720)  . vancomycin     Assessment:  81 yr  female with weakness, abdominal pain, nausea/vomiting  In ED, pt with T 100.14F, HR as high as 153, RR as high as 34, WBC 17.7  Code Sepsis called  Blood, urine cultures ordered; CDiff check ordered  Pharmacy consulted to dose Vancomycin & Zosyn for sepsis  Goal of Therapy:  Vancomycin trough level 15-20 mcg/ml  Plan:  Measure antibiotic drug levels at steady state Follow up culture results   Vancomycin 500mg  IV q12h  Zosyn 3.375gm IV q8h (each dose infused over 4 hrs)  Litisha Guagliardo, Toribio Harbour, PharmD 02/28/2014,6:41 PM

## 2014-03-01 DIAGNOSIS — N10 Acute tubulo-interstitial nephritis: Secondary | ICD-10-CM

## 2014-03-01 DIAGNOSIS — I248 Other forms of acute ischemic heart disease: Secondary | ICD-10-CM

## 2014-03-01 LAB — CBC
HCT: 36.6 % (ref 36.0–46.0)
Hemoglobin: 11.8 g/dL — ABNORMAL LOW (ref 12.0–15.0)
MCH: 30.6 pg (ref 26.0–34.0)
MCHC: 32.2 g/dL (ref 30.0–36.0)
MCV: 94.8 fL (ref 78.0–100.0)
Platelets: 92 10*3/uL — ABNORMAL LOW (ref 150–400)
RBC: 3.86 MIL/uL — ABNORMAL LOW (ref 3.87–5.11)
RDW: 14.1 % (ref 11.5–15.5)
WBC: 7.5 10*3/uL (ref 4.0–10.5)

## 2014-03-01 LAB — BASIC METABOLIC PANEL
Anion gap: 4 — ABNORMAL LOW (ref 5–15)
BUN: 16 mg/dL (ref 6–23)
CO2: 26 mmol/L (ref 19–32)
Calcium: 6.7 mg/dL — ABNORMAL LOW (ref 8.4–10.5)
Chloride: 107 mmol/L (ref 96–112)
Creatinine, Ser: 0.6 mg/dL (ref 0.50–1.10)
GFR calc Af Amer: >90 mL/min (ref >90)
GFR calc non Af Amer: 80 mL/min — ABNORMAL LOW (ref >90)
Glucose, Bld: 117 mg/dL — ABNORMAL HIGH (ref 70–99)
Potassium: 3.4 mmol/L — ABNORMAL LOW (ref 3.5–5.1)
Sodium: 137 mmol/L (ref 135–145)

## 2014-03-01 LAB — TROPONIN I
Troponin I: 0.16 ng/mL — ABNORMAL HIGH (ref <0.031)
Troponin I: 0.11 ng/mL — ABNORMAL HIGH (ref <0.031)

## 2014-03-01 LAB — PROCALCITONIN: Procalcitonin: 1.29 ng/mL

## 2014-03-01 LAB — MRSA PCR SCREENING: MRSA by PCR: NEGATIVE

## 2014-03-01 LAB — HEPARIN LEVEL (UNFRACTIONATED)
Heparin Unfractionated: 0.2 IU/mL — ABNORMAL LOW (ref 0.30–0.70)
Heparin Unfractionated: 0.4 IU/mL (ref 0.30–0.70)

## 2014-03-01 LAB — APTT: aPTT: >200 seconds (ref 24–37)

## 2014-03-01 MED ORDER — SODIUM CHLORIDE 0.9 % IV BOLUS (SEPSIS)
1000.0000 mL | Freq: Once | INTRAVENOUS | Status: AC
  Administered 2014-03-01: 1000 mL via INTRAVENOUS

## 2014-03-01 MED ORDER — IPRATROPIUM-ALBUTEROL 0.5-2.5 (3) MG/3ML IN SOLN: 3.0000 mL | RESPIRATORY_TRACT | Status: DC | PRN

## 2014-03-01 MED ORDER — HEPARIN BOLUS VIA INFUSION
1000.0000 [IU] | Freq: Once | INTRAVENOUS | Status: AC
  Administered 2014-03-01: 1000 [IU] via INTRAVENOUS
  Filled 2014-03-01: qty 1000

## 2014-03-01 MED ORDER — ENOXAPARIN SODIUM 40 MG/0.4ML ~~LOC~~ SOLN
40.0000 mg | SUBCUTANEOUS | Status: DC
  Administered 2014-03-01: 40 mg via SUBCUTANEOUS
  Filled 2014-03-01: qty 0.4

## 2014-03-01 MED ORDER — IPRATROPIUM-ALBUTEROL 0.5-2.5 (3) MG/3ML IN SOLN
3.0000 mL | Freq: Four times a day (QID) | RESPIRATORY_TRACT | Status: DC
  Administered 2014-03-01 – 2014-03-02 (×3): 3 mL via RESPIRATORY_TRACT
  Filled 2014-03-01 (×3): qty 3

## 2014-03-01 MED ORDER — SODIUM CHLORIDE 0.9 % IV BOLUS (SEPSIS)
500.0000 mL | Freq: Once | INTRAVENOUS | Status: AC
  Administered 2014-03-01: 500 mL via INTRAVENOUS

## 2014-03-01 MED ORDER — POTASSIUM CHLORIDE 10 MEQ/100ML IV SOLN
10.0000 meq | INTRAVENOUS | Status: AC
  Administered 2014-03-01 (×3): 10 meq via INTRAVENOUS
  Filled 2014-03-01 (×3): qty 100

## 2014-03-01 MED ORDER — OXYCODONE-ACETAMINOPHEN 5-325 MG PO TABS
1.0000 | ORAL_TABLET | ORAL | Status: DC | PRN
  Administered 2014-03-01 – 2014-03-02 (×4): 1 via ORAL
  Administered 2014-03-02 – 2014-03-08 (×4): 2 via ORAL
  Filled 2014-03-01: qty 1
  Filled 2014-03-01: qty 2
  Filled 2014-03-01: qty 1
  Filled 2014-03-01: qty 2
  Filled 2014-03-01: qty 1
  Filled 2014-03-01 (×2): qty 2
  Filled 2014-03-01: qty 1
  Filled 2014-03-01: qty 2
  Filled 2014-03-01: qty 1
  Filled 2014-03-01: qty 2

## 2014-03-01 NOTE — Progress Notes (Signed)
Patient states percocet is only medication that provides relief for headache. Patient states Oxycodone and Tylenol does not touch her pain. Text-paged MD Charlies Silvers to make aware and correct medications.

## 2014-03-01 NOTE — Progress Notes (Signed)
ANTICOAGULATION CONSULT NOTE - F/u Consult  Pharmacy Consult for Heparin Indication: chest pain/ACS  Allergies  Allergen Reactions  . Codeine     REACTION: n/v/d, HA  . Morphine     REACTION: n/v/d, HA  . Sulfa Antibiotics     Sick    . Zoledronic Acid     REACTION: Severe edema    Patient Measurements: Height: 5' (152.4 cm) Weight: 118 lb 2.7 oz (53.6 kg) IBW/kg (Calculated) : 45.5 Heparin Dosing Weight: 54 kg  Vital Signs: Temp: 98.9 F (37.2 C) (02/03 0400) Temp Source: Axillary (02/03 0400) BP: 95/42 mmHg (02/03 0700) Pulse Rate: 73 (02/03 0700)  Labs:  Recent Labs  02/28/14 1727 02/28/14 2103 02/28/14 2322 03/01/14 0304 03/01/14 0554  HGB 17.9*  --   --  11.8*  --   HCT 53.3*  --   --  36.6  --   PLT 129*  --   --  92*  --   APTT  --   --  >200*  --   --   LABPROT 13.0  --   --   --   --   INR 0.97  --   --   --   --   HEPARINUNFRC  --   --   --   --  0.20*  CREATININE 0.72  --   --  0.60  --   TROPONINI 0.36* 0.31*  --  0.16*  --     Estimated Creatinine Clearance: 36.3 mL/min (by C-G formula based on Cr of 0.6).   Medical History: Past Medical History  Diagnosis Date  . Osteoporosis   . Arthritis     osteoarthritis  . GERD (gastroesophageal reflux disease)   . Hiatal hernia   . PUD (peptic ulcer disease)   . Cardiac arrhythmia   . Hyperlipidemia   . Heart failure   . Hypertension   . Scoliosis deformity of spine     history of intractable back pain  . Choledocholithiasis   . Pancolitis     Infectious vs. inflammatory  . Abdominal pain     Resolved  . Chronic anemia   . Chronic back pain   . Pancreatitis     History of  . CHF (congestive heart failure)     Diastolic  on ECHO 7124  . COPD (chronic obstructive pulmonary disease)     oxygen dependent at  night 2LPM    Medications:  Scheduled:  . albuterol  2.5 mg Nebulization Q6H  . antiseptic oral rinse  7 mL Mouth Rinse q12n4p  . aspirin  325 mg Oral Daily  .  calcium-vitamin D  1 tablet Oral BID WC  . chlorhexidine  15 mL Mouth Rinse BID  . feeding supplement (RESOURCE BREEZE)  1 Container Oral TID BM  . gabapentin  100 mg Oral BID  . nitroGLYCERIN  0.5 inch Topical 4 times per day  . pantoprazole (PROTONIX) IV  40 mg Intravenous Q12H  . piperacillin-tazobactam (ZOSYN)  IV  3.375 g Intravenous Q8H  . vancomycin  500 mg Intravenous Q12H   Infusions:  . sodium chloride 50 mL/hr at 02/28/14 2154  . heparin 750 Units/hr (03/01/14 5809)    Assessment: 88yoF admitted with sepsis and pneumonia, noted to also have elevated troponins w/o CP.  Consulted to begin heparin to r/o ACS   Baseline INR wnl  "Baseline" aPTT drawn 2 hr after heparin bolus; grossly elevated  Baseline CBC unremarkable, appears to be some hemoconcentration from dehydration  Today, 2/3   Plt down approx 30%, now slightly < 100k, but most likely attributable to rapid  rehydration (code sepsis patient)  Hgb down with rehydration but still > 10g  Heparin rate increased this AM with subtherapeutic level; rebolused 1000 units x 1  No signs of bleeding or infusion issues per nursing  Goal of Therapy:  Heparin level 0.3-0.7 units/ml Monitor platelets by anticoagulation protocol: Yes   Plan:   Continue heparin infusion at 750 units/hr  Rechecking 8-hr heparin level at 1500 today  Check heparin level & CBC daily  Reuel Boom, PharmD Pager: [phone] 03/01/2014, 9:24 AM

## 2014-03-01 NOTE — Progress Notes (Signed)
Pt in with sepsis and having soft BPs with low MAPs during shift. Up to this point, pt has received 1500cc boluses and is in the process of getting 500cc more. Spoke to Dr. Detterding with PCCM who advised giving total of 4L before going to pressors, given the fact that pt is making urine and is easily arousable with no respiratory difficulties. Therefore, will given another 500cc after the present bolus and RN to call BP and MAP after this bolus finishes. After this last 500cc, the total IVF volume of boluses since 7pm 02/28/14 will be 2500cc.  Clance Boll, NP [facility]

## 2014-03-01 NOTE — Consult Note (Addendum)
CARDIOLOGY CONSULT NOTE   Patient ID: [name] [name] MRN: [id number] DOB/AGE: 79-Oct-1927 79 y.o.  Admit Date: 02/28/2014  Primary Physician: [name], NP  Primary Cardiologist   [place]   Clinical Summary [name] [name] is a 81 y.o.female. The patient presented with nausea and vomiting and dehydration. She has had a sepsis syndrome with question of several sites. She is being treated well and improving. Along with this she had mild troponin elevation. Cardiology is consulted for further evaluation. EKG reveals no acute change.   The patient has been followed in the past by Dr. Mare Ferrari. He saw her last September, 2014. Historically she has a normal ejection fraction. She has had atypical chest pain in the past. She has significant lung disease and is followed by the pulmonary team.   Allergies  Allergen Reactions  . Codeine     REACTION: n/v/d, HA  . Morphine     REACTION: n/v/d, HA  . Sulfa Antibiotics     Sick    . Zoledronic Acid     REACTION: Severe edema    Medications Scheduled Medications: . antiseptic oral rinse  7 mL Mouth Rinse q12n4p  . aspirin  325 mg Oral Daily  . calcium-vitamin D  1 tablet Oral BID WC  . chlorhexidine  15 mL Mouth Rinse BID  . feeding supplement (RESOURCE BREEZE)  1 Container Oral TID BM  . gabapentin  100 mg Oral BID  . ipratropium-albuterol  3 mL Nebulization QID  . nitroGLYCERIN  0.5 inch Topical 4 times per day  . pantoprazole (PROTONIX) IV  40 mg Intravenous Q12H  . piperacillin-tazobactam (ZOSYN)  IV  3.375 g Intravenous Q8H  . potassium chloride  10 mEq Intravenous Q1 Hr x 3  . vancomycin  500 mg Intravenous Q12H     Infusions: . sodium chloride 50 mL/hr at 02/28/14 2154  . heparin 750 Units/hr (03/01/14 0700)     PRN Medications:  acetaminophen **OR** acetaminophen, alum & mag hydroxide-simeth, HYDROmorphone (DILAUDID) injection, ipratropium-albuterol, nitroGLYCERIN, ondansetron **OR** ondansetron (ZOFRAN)  IV, oxyCODONE   Past Medical History  Diagnosis Date  . Osteoporosis   . Arthritis     osteoarthritis  . GERD (gastroesophageal reflux disease)   . Hiatal hernia   . PUD (peptic ulcer disease)   . Cardiac arrhythmia   . Hyperlipidemia   . Heart failure   . Hypertension   . Scoliosis deformity of spine     history of intractable back pain  . Choledocholithiasis   . Pancolitis     Infectious vs. inflammatory  . Abdominal pain     Resolved  . Chronic anemia   . Chronic back pain   . Pancreatitis     History of  . CHF (congestive heart failure)     Diastolic  on ECHO 5397  . COPD (chronic obstructive pulmonary disease)     oxygen dependent at  night 2LPM    Past Surgical History  Procedure Laterality Date  . Cholecystectomy  2008  . Abdominal hysterectomy  1970s  . Spine surgery  [phone]  . Neck surgery  1970s  . Spinal cord stimulator implant    . Ercp w/ sphicterotomy  02/2010  . Back surgery      x 5  . Pain pump implantation  2009    back, dilaudid and bupravacaine  . Flexible sigmoidoscopy N/A 01/24/2013    Procedure: FLEXIBLE SIGMOIDOSCOPY;  Surgeon: Missy Sabins, MD;  Location: [facility];  Service: Endoscopy;  Laterality:  N/A;  . Esophagogastroduodenoscopy N/A 11/14/2013    Procedure: ESOPHAGOGASTRODUODENOSCOPY (EGD);  Surgeon: Missy Sabins, MD;  [facility] ENDOSCOPY;  Service: Endoscopy;  Laterality: N/A;    Family History  Problem Relation Age of Onset  . Cancer Mother     uterine  . Heart disease Father   . Hypertension Other     Social History [name] [name] reports that she quit smoking about 26 years ago. Her smoking use included Cigarettes. She has a 12 pack-year smoking history. She has never used smokeless tobacco. [name] [name] reports that she does not drink alcohol.  Review of Systems Patient denies fever, chills, headache, sweats, rash, change in vision, change in hearing, chest pain, cough, nausea or vomiting, urinary symptoms at  this time. All other systems are reviewed and are negative.  Physical Examination Blood pressure 94/35, pulse 85, temperature 98.9 F (37.2 C), temperature source Oral, resp. rate 10, height 5' (1.524 m), weight 118 lb 2.7 oz (53.6 kg), SpO2 100 %.  Intake/Output Summary (Last 24 hours) at 03/01/14 1431 Last data filed at 03/01/14 1200  Gross per 24 hour  Intake 5156.28 ml  Output   1100 ml  Net 4056.28 ml   The patient is oriented to person time and place. Affect is normal. She has several family members in the room. Head is atraumatic. Sclera and conjunctiva are normal. There is no jugular venous distention. Lungs reveal scattered rhonchi. Cardiac exam reveals S1 and S2. The abdomen is soft. There is no peripheral edema. There are no musculoskeletal deformities. There are no skin rashes. Neurologic is grossly intact.  Prior Cardiac Testing/Procedures  Lab Results  Basic Metabolic Panel:  Recent Labs Lab 02/28/14 1727 03/01/14 0304  NA 135 137  K 3.5 3.4*  CL 96 107  CO2 25 26  GLUCOSE 161* 117*  BUN 15 16  CREATININE 0.72 0.60  CALCIUM 8.2* 6.7*    Liver Function Tests:  Recent Labs Lab 02/28/14 1727  AST 41*  ALT 23  ALKPHOS 100  BILITOT 0.7  PROT 6.7  ALBUMIN 3.6    CBC:  Recent Labs Lab 02/28/14 1727 03/01/14 0304  WBC 17.7* 7.5  NEUTROABS 15.3*  --   HGB 17.9* 11.8*  HCT 53.3* 36.6  MCV 93.8 94.8  PLT 129* 92*    Cardiac Enzymes:  Recent Labs Lab 02/28/14 1727 02/28/14 2103 03/01/14 0304 03/01/14 0854  TROPONINI 0.36* 0.31* 0.16* 0.11*    BNP: Invalid input(s): POCBNP   Radiology: Ct Abdomen Pelvis W Contrast  02/28/2014   CLINICAL DATA:  Acute onset of generalized weakness. Abdominal pain, nausea and vomiting. Initial encounter.  EXAM: CT ABDOMEN AND PELVIS WITH CONTRAST  TECHNIQUE: Multidetector CT imaging of the abdomen and pelvis was performed using the standard protocol following bolus administration of intravenous contrast.   CONTRAST:  167mL OMNIPAQUE IOHEXOL 300 MG/ML  SOLN  COMPARISON:  CT of the abdomen and pelvis from 11/14/2013  FINDINGS: Patchy right-sided airspace opacities are compatible with pneumonia.  The liver and spleen are unremarkable in appearance. The patient is status post cholecystectomy, with clips noted along the gallbladder fossa. Pneumobilia likely reflects prior instrumentation at the duodenal ampulla.  Vague stranding about the pancreas is similar in appearance to the stranding about small bowel loops, with fluid tracking about the second segment of the duodenum. This is thought to reflect the acute colonic process described below. Trace associated ascites is seen within the abdomen and pelvis. The adrenal glands are unremarkable in appearance.  Vaguely decreased enhancement is noted at the anterior aspect of the right kidney, of uncertain significance and slightly less apparent on delayed images. Would correlate for evidence of mild focal pyelonephritis. Mild left renal scarring is noted. Scattered small bilateral renal cysts are seen. There is no evidence of hydronephrosis. No renal or ureteral stones are identified.  There is mild edema noted with regard to the first and second segments of the duodenum, though this may be reactive in nature. Remaining visualized small bowel loops are grossly unremarkable. The stomach is within normal limits. No acute vascular abnormalities are seen. Scattered calcification is seen along the abdominal aorta and its branches. The abdominal aorta is somewhat tortuous in appearance.  The appendix is not definitely seen; there is no evidence of appendicitis.  There is diffuse wall thickening and edema noted along much of the colon, with partial sparing of the ascending colon. Surrounding soft tissue inflammation and trace fluid are seen, particularly along the transverse and sigmoid colon. Findings are compatible with acute colitis, either infectious or inflammatory in nature. The  distribution suggests against ischemia.  A metallic device is noted at the right lower quadrant anterior abdominal wall. An additional thoracic spine simulation device is noted at the left flank, with a lead extending up the thoracic spine.  The bladder is mildly distended. Apparent bladder wall thickening is suspicious for cystitis, as it is new from the prior study. The patient is status post hysterectomy. No suspicious adnexal masses are seen. No inguinal lymphadenopathy is seen.  No acute osseous abnormalities are identified. Right convex thoracolumbar scoliosis is noted. Underlying vacuum phenomenon and endplate sclerotic change are seen.  IMPRESSION: 1. Diffuse acute colitis noted, with partial sparing of the ascending colon, and surrounding soft tissue inflammation and trace fluid. This is either infectious or inflammatory in nature. 2. Bladder wall thickening is suspicious for cystitis, as it is new from the prior study. 3. Vaguely decreased enhancement at the anterior aspect of the right kidney is apparently new from the prior study, and may reflect mild focal pyelonephritis. Would correlate for associated symptoms. 4. Relatively diffuse patchy right-sided pneumonia noted. 5. Trace ascites within the abdomen and pelvis is thought to reflect the colonic process. Mild edema about the small bowel and pancreas are thought to be reactive. This is most prominent about the first and second segments of the duodenum, of uncertain significance. 6. Pneumobilia likely reflects prior instrumentation at the duodenal ampulla. 7. Scattered small bilateral renal cysts; mild left renal scarring. 8. Scattered calcification along the abdominal aorta and its branches. 9. Right convex thoracolumbar scoliosis, with underlying mild degenerative change.  These results were called by telephone at the time of interpretation on 02/28/2014 at 7:33 pm to Dr. Sherwood Gambler, who verbally acknowledged these results.   Electronically Signed    By: Garald Balding M.D.   On: 02/28/2014 19:37   Dg Chest Port 1 View  02/28/2014   CLINICAL DATA:  Fever, nausea, vomiting  EXAM: PORTABLE CHEST - 1 VIEW  COMPARISON:  Radiograph 11/13/2013  FINDINGS: Normal cardiac silhouette. There is fine bronchitic markings in the lungs. No focal infiltrate. No effusion. No pneumothorax.  IMPRESSION: No acute cardiopulmonary process.   Electronically Signed   By: Suzy Bouchard M.D.   On: 02/28/2014 18:24     ECG: I have reviewed current and old EKGs. There is no acute change.  Telemetry: I have reviewed telemetry personally today, March 01, 2014. There is normal sinus rhythm.   Impression  and Recommendations    COPD with emphysema, gold stage C.     Patient has severe lung disease that is followed by the pulmonary team.    Pancolitis   Severe sepsis      Patient presented with sepsis. This is the major presenting issue. She's had some demand ischemia related to this. She is doing better with treatment of her sepsis.     Demand ischemia  The patient presented with a troponin of 0.36 that came down over time. There is no acute EKG change. This level of troponin can certainly be explained by demand ischemia from her sepsis. There is no evidence that she's had an acute MI. No further cardiac workup is recommended at this time. I have stopped nitroglycerin paste. Suggest stopping IV heparin and choosing approach for DVT prophyllaxis.   Daryel November, MD 03/01/2014, 2:31 PM

## 2014-03-01 NOTE — Progress Notes (Addendum)
Family asking if GI will be consulted or investigated further due to CT results, and patient's still complaints of nausea. Text-paged MD Charlies Silvers to address concerns.  Will call GI 03/02/2014.  Leisa Lenz, MD

## 2014-03-01 NOTE — Progress Notes (Addendum)
ANTICOAGULATION CONSULT NOTE - F/u Consult  Pharmacy Consult for Heparin Indication: chest pain/ACS  Allergies  Allergen Reactions  . Codeine     REACTION: n/v/d, HA  . Morphine     REACTION: n/v/d, HA  . Sulfa Antibiotics     Sick    . Zoledronic Acid     REACTION: Severe edema    Patient Measurements: Height: 5' (152.4 cm) Weight: 118 lb 2.7 oz (53.6 kg) IBW/kg (Calculated) : 45.5 Heparin Dosing Weight: 54 kg  Vital Signs: Temp: 98.9 F (37.2 C) (02/03 0400) Temp Source: Axillary (02/03 0400) BP: 80/38 mmHg (02/03 0600) Pulse Rate: 82 (02/03 0600)  Labs:  Recent Labs  02/28/14 1727 02/28/14 2103 02/28/14 2322 03/01/14 0304 03/01/14 0554  HGB 17.9*  --   --  11.8*  --   HCT 53.3*  --   --  36.6  --   PLT 129*  --   --  92*  --   APTT  --   --  >200*  --   --   LABPROT 13.0  --   --   --   --   INR 0.97  --   --   --   --   HEPARINUNFRC  --   --   --   --  0.20*  CREATININE 0.72  --   --  0.60  --   TROPONINI 0.36* 0.31*  --  0.16*  --     Estimated Creatinine Clearance: 36.3 mL/min (by C-G formula based on Cr of 0.6).   Medical History: Past Medical History  Diagnosis Date  . Osteoporosis   . Arthritis     osteoarthritis  . GERD (gastroesophageal reflux disease)   . Hiatal hernia   . PUD (peptic ulcer disease)   . Cardiac arrhythmia   . Hyperlipidemia   . Heart failure   . Hypertension   . Scoliosis deformity of spine     history of intractable back pain  . Choledocholithiasis   . Pancolitis     Infectious vs. inflammatory  . Abdominal pain     Resolved  . Chronic anemia   . Chronic back pain   . Pancreatitis     History of  . CHF (congestive heart failure)     Diastolic  on ECHO 1443  . COPD (chronic obstructive pulmonary disease)     oxygen dependent at  night 2LPM    Medications:  Scheduled:  . albuterol  2.5 mg Nebulization Q6H  . antiseptic oral rinse  7 mL Mouth Rinse q12n4p  . aspirin  325 mg Oral Daily  .  calcium-vitamin D  1 tablet Oral BID WC  . chlorhexidine  15 mL Mouth Rinse BID  . feeding supplement (RESOURCE BREEZE)  1 Container Oral TID BM  . gabapentin  100 mg Oral BID  . heparin  1,000 Units Intravenous Once  . nitroGLYCERIN  0.5 inch Topical 4 times per day  . pantoprazole (PROTONIX) IV  40 mg Intravenous Q12H  . piperacillin-tazobactam (ZOSYN)  IV  3.375 g Intravenous Q8H  . vancomycin  500 mg Intravenous Q12H   Infusions:  . sodium chloride 50 mL/hr at 02/28/14 2154  . heparin 600 Units/hr (02/28/14 2208)    Assessment:  Pt known to pharmacy from earlier dosing of Vancomycin and Zosyn  Patient with elevated troponin   Pharmacy consulted to dose heparin for ACS/STEMI  Baseline INR obtained  Today, 2/3   0554 HL=0.20, no problems per RN,  below goal ** note Plts=92 this am slighlty down from 129 on  2/2 **  Goal of Therapy:  Heparin level 0.3-0.7 units/ml Monitor platelets by anticoagulation protocol: Yes   Plan:   Heparin 1000 unit IV bolus x 1 and increase heparin drip to 750 units/hr  Check heparin level 8 hr after heparin increased  Check heparin level & CBC daily  Dorrene German, PharmD 03/01/2014,6:38 AM

## 2014-03-01 NOTE — Progress Notes (Signed)
INITIAL NUTRITION ASSESSMENT  DOCUMENTATION CODES Per approved criteria  -Not Applicable   INTERVENTION: - Diet advancement as medically tolerated - Resource Breeze po TID, each supplement provides 250 kcal and 9 grams of protein - Once diet advanced, add Magic cup TID with meals, each supplement provides 290 kcal and 9 grams of protein - Once diet advanced, pt would like pm snack of 1/2 sandwich (egg salad or grilled cheese) - RD will continue to monitor  NUTRITION DIAGNOSIS: Inadequate oral intake related to N/V/D as evidenced by poor po and gradual wt loss.   Goal: Pt to meet >/= 90% of their estimated nutrition needs   Monitor:  Wt trend, po intake, acceptance of supplements, diet advancement, labs  Reason for Assessment: Malnutrition Screening Tool  81 y.o. female  Admitting Dx: <principal problem not specified>  ASSESSMENT: 81 y.o. female with a history of COPD Gold Stage C, Chronic CHF, HTN, and Hyperlipidemia and severe Scoliosis who was brought to the ED due to increased Nausea and Vomiting and Diarrhea since last night. She has had fevers and chills as well, she denies any hematemesis, or melena, or hematochezia. She was found to have diffuse colitis and RLL pneumonia on CT scan.  Spoke with pt and family in room. - Pt has had a 5 lb gradual wt loss over the past year.  - Pt nauseated, but says that it is improving.  - She normally eats "pretty well" and likes foods such as ice cream, sweets, chicken and hamburgers.  - Pt does not like Ensure Complete and would rather try clear liquid supplement. She likes ice cream and agreed to try magic cups and a pm snack once diet advanced.   Labs reviewed  Height: Ht Readings from Last 1 Encounters:  02/28/14 5' (1.524 m)    Weight: Wt Readings from Last 1 Encounters:  02/28/14 118 lb 2.7 oz (53.6 kg)    Ideal Body Weight: 45.5 kg  % Ideal Body Weight: 118%  Wt Readings from Last 10 Encounters:  02/28/14 118 lb  2.7 oz (53.6 kg)  12/05/13 118 lb (53.524 kg)  11/24/13 114 lb 9.6 oz (51.982 kg)  11/13/13 115 lb (52.164 kg)  10/31/13 114 lb 12.8 oz (52.073 kg)  08/08/13 115 lb 0.6 oz (52.182 kg)  03/30/13 117 lb (53.071 kg)  02/07/13 116 lb 0.6 oz (52.635 kg)  12/28/12 118 lb (53.524 kg)  11/01/12 121 lb (54.885 kg)   BMI:  Body mass index is 23.08 kg/(m^2).  Estimated Nutritional Needs: Kcal: 1400-1600 Protein: 70-80 g Fluid: >1.6 L/day  Skin: intact  Diet Order: Diet clear liquid  EDUCATION NEEDS: -Education needs addressed   Intake/Output Summary (Last 24 hours) at 03/01/14 1040 Last data filed at 03/01/14 0700  Gross per 24 hour  Intake 4688.78 ml  Output    325 ml  Net 4363.78 ml    Last BM: prior to admission   Labs:   Recent Labs Lab 02/28/14 1727 03/01/14 0304  NA 135 137  K 3.5 3.4*  CL 96 107  CO2 25 26  BUN 15 16  CREATININE 0.72 0.60  CALCIUM 8.2* 6.7*  GLUCOSE 161* 117*    CBG (last 3)  No results for input(s): GLUCAP in the last 72 hours.  Scheduled Meds: . albuterol  2.5 mg Nebulization Q6H  . antiseptic oral rinse  7 mL Mouth Rinse q12n4p  . aspirin  325 mg Oral Daily  . calcium-vitamin D  1 tablet Oral BID WC  .  chlorhexidine  15 mL Mouth Rinse BID  . feeding supplement (RESOURCE BREEZE)  1 Container Oral TID BM  . gabapentin  100 mg Oral BID  . nitroGLYCERIN  0.5 inch Topical 4 times per day  . pantoprazole (PROTONIX) IV  40 mg Intravenous Q12H  . piperacillin-tazobactam (ZOSYN)  IV  3.375 g Intravenous Q8H  . vancomycin  500 mg Intravenous Q12H    Continuous Infusions: . sodium chloride 50 mL/hr at 02/28/14 2154  . heparin 750 Units/hr (03/01/14 5537)    Past Medical History  Diagnosis Date  . Osteoporosis   . Arthritis     osteoarthritis  . GERD (gastroesophageal reflux disease)   . Hiatal hernia   . PUD (peptic ulcer disease)   . Cardiac arrhythmia   . Hyperlipidemia   . Heart failure   . Hypertension   . Scoliosis  deformity of spine     history of intractable back pain  . Choledocholithiasis   . Pancolitis     Infectious vs. inflammatory  . Abdominal pain     Resolved  . Chronic anemia   . Chronic back pain   . Pancreatitis     History of  . CHF (congestive heart failure)     Diastolic  on ECHO 4827  . COPD (chronic obstructive pulmonary disease)     oxygen dependent at  night 2LPM    Past Surgical History  Procedure Laterality Date  . Cholecystectomy  2008  . Abdominal hysterectomy  1970s  . Spine surgery  [phone]  . Neck surgery  1970s  . Spinal cord stimulator implant    . Ercp w/ sphicterotomy  02/2010  . Back surgery      x 5  . Pain pump implantation  2009    back, dilaudid and bupravacaine  . Flexible sigmoidoscopy N/A 01/24/2013    Procedure: FLEXIBLE SIGMOIDOSCOPY;  Surgeon: Missy Sabins, MD;  Location: [facility];  Service: Endoscopy;  Laterality: N/A;  . Esophagogastroduodenoscopy N/A 11/14/2013    Procedure: ESOPHAGOGASTRODUODENOSCOPY (EGD);  Surgeon: Missy Sabins, MD;  Location[facility] ENDOSCOPY;  Service: Endoscopy;  Laterality: N/A;    Laurette Schimke MS, RD, LDN

## 2014-03-01 NOTE — Progress Notes (Signed)
Spoke to MD Charlies Silvers to confirm isolation orders. Patient has not had any loose BM's since admission. MD states she was told patient had diarrhea prior to coming to hospital. Spoke with family and they stated patient did have bowel movements prior to coming to hospital but they were formed. MD Charlies Silvers stated to DC contact orders but as soon as patient has one loose stool, place back on contact.

## 2014-03-01 NOTE — Progress Notes (Addendum)
Patient ID: [name] [name], female   DOB: March 13, 1925, 81 y.o.   MRN: [id number] [facility] PROGRESS NOTE  Tonya Wantz TIR:[id number] DOB: 1925/03/03 DOA: 02/28/2014 PCP: [name], NP  Brief narrative:    26 -year-old female with past medical history hypertension, COPD who presented to [facility] ED with complaints of nausea, vomiting started 24 hours prior to this admission. Per family at the bedside, patient had fevers and chills. No reports of blood in the stool or melanotic stool. On admission, blood pressure was 73/33, heart rate 80 - 153, RR 9 - 34, T max 100.7 F, oxygen saturation 93% on nasal cannula oxygen support. Blood work revealed leukocytosis of 17.7, hemoglobin 17.9, platelets 129. Troponin level initially elevated at 0.31. Chest x-ray showed no acute cardiopulmonary process. CT abdomen revealed diffuse acute colitis, bladder wall thickening suspicious for cystitis, possible right side focal pyelonephritis, relatively diffuse patchy right-sided pneumonia. Patient was started on broad-spectrum antibiotics, vancomycin and Zosyn for treatment of multiple infections including pneumonia, UTI, colitis, possible sepsis. Because of hypotension, patient was admitted to stepdown unit.  Assessment/Plan:     Principal problem: Severe sepsis / septic shock secondary to multiple infections, pneumonia, urinary tract infection and colitis - Patient was hypotensive on the admission, had tachycardia and tachypnea, oxygen saturation in the low 90s while on nasal cannula oxygen support. Blood work significant for leukocytosis of 17.7, lactic acid of 4.85. Urinalysis is significant for many bacteria but leukocytes and nitrites were not detectable on urinalysis. CT abdomen suspicious for focal right-sided pyelonephritis, relatively diffuse patchy right-sided pneumonia and diffuse colitis. - Patient was started on broad-spectrum antibiotics, vancomycin and Zosyn. Follow-up blood culture results,  urine culture results. - Patient is currently slightly hypotensive with systolic blood pressure 94. She does not require pressor support at this time. - We will continue to monitor in step down unit for next 24 hours.  Active Problems: Community acquired pneumonia - Patient placed on broad-spectrum antibiotics, vancomycin and Zosyn. CT abdomen showed possible patchy right-sided pneumonia. No acute cardiopulmonary disease was seen on chest x-ray. - Follow up blood culture results.  Urinary tract infection / possible right-sided pyelonephritis - Urinalysis revealed many bacteria but leukocytes and nitrites were not detected on routine urinalysis. - Acute right-sided pyelonephritis seen on CT abdomen. - We'll continue current antibiotics, vancomycin and Zosyn. - Follow up urine culture results.  Diffuse colitis - As seen on CT abdomen, additionally complaints of abdominal pain, nausea, vomiting - Continue vanco and zosyn  Hypokalemia - Due to GI losses - Supplemented - Repeat BMP in am  Acute COPD exacerbation / acute respiratory failure with hypoxia - Respiratory status is stable. Patient had oxygen saturation in low 90s but she is on a nasal cannula oxygen support. - May use duo neb nebulizer every 4 hours as needed for shortness of breath or wheezing  NSTEMI (non-ST elevated myocardial infarction) - Troponin elevation seen on the admission, reached a plateau at 0.3 and now trending down. No complaints of chest pain. This is likely secondary to demand ischemia from acute multiple infections, sepsis and septic shock. - Started on heparin drip - Cardiology consulted, appreciate their input   DVT Prophylaxis  - on heparin drip    Code Status: Full.  Family Communication:  plan of care discussed with the patient's daughter Disposition Plan: Patient is still hypotensive, requires heparin drip. She will remain in step down unit for next 24 hours. We'll consult cardiology and if  patient off of heparin drip likely  can be transferred to telemetry floor tomorrow 03/02/2014.  IV access:  Peripheral IV  Procedures and diagnostic studies:    Ct Abdomen Pelvis W Contrast 02/28/2014  1. Diffuse acute colitis noted, with partial sparing of the ascending colon, and surrounding soft tissue inflammation and trace fluid. This is either infectious or inflammatory in nature. 2. Bladder wall thickening is suspicious for cystitis, as it is new from the prior study. 3. Vaguely decreased enhancement at the anterior aspect of the right kidney is apparently new from the prior study, and may reflect mild focal pyelonephritis. Would correlate for associated symptoms. 4. Relatively diffuse patchy right-sided pneumonia noted. 5. Trace ascites within the abdomen and pelvis is thought to reflect the colonic process. Mild edema about the small bowel and pancreas are thought to be reactive. This is most prominent about the first and second segments of the duodenum, of uncertain significance. 6. Pneumobilia likely reflects prior instrumentation at the duodenal ampulla. 7. Scattered small bilateral renal cysts; mild left renal scarring. 8. Scattered calcification along the abdominal aorta and its branches. 9. Right convex thoracolumbar scoliosis, with underlying mild degenerative change.   Dg Chest Port 1 View 22/2016    No acute cardiopulmonary process.   Electronically Signed   By: Suzy Bouchard M.D.   On: 02/28/2014 18:24   Medical Consultants:  Cardiology  Other Consultants:  Physical therapy   IAnti-Infectives:   Vancomycin 03/01/2014 --> Zosyn [id number] -->   Leisa Lenz, MD  [facility] Pager [phone]  If 7PM-7AM, please contact night-coverage [email or web address] Password [facility] 03/01/2014, 12:37 PM   LOS: 1 day    HPI/Subjective: No acute overnight events.  Objective: Filed Vitals:   03/01/14 0900 03/01/14 1000 03/01/14 1100 03/01/14 1200  BP: 140/64 121/54 124/46 94/35   Pulse: 102 100 96 85  Temp:      TempSrc:      Resp: 19 9 12 10   Height:      Weight:      SpO2: 99% 99% 100% 100%    Intake/Output Summary (Last 24 hours) at 03/01/14 1237 Last data filed at 03/01/14 1200  Gross per 24 hour  Intake 5156.28 ml  Output   1100 ml  Net 4056.28 ml    Exam:   General:  Pt is alert,  not in acute distress  Cardiovascular: tachycardic, S1/S2, no murmurs  Respiratory: Clear to auscultation bilaterally, no wheezing  Abdomen: Soft, non tender, non distended, bowel sounds present  Extremities: No edema, pulses DP and PT palpable bilaterally  Neuro: Grossly nonfocal  Data Reviewed: Basic Metabolic Panel:  Recent Labs Lab 02/28/14 1727 03/01/14 0304  NA 135 137  K 3.5 3.4*  CL 96 107  CO2 25 26  GLUCOSE 161* 117*  BUN 15 16  CREATININE 0.72 0.60  CALCIUM 8.2* 6.7*   Liver Function Tests:  Recent Labs Lab 02/28/14 1727  AST 41*  ALT 23  ALKPHOS 100  BILITOT 0.7  PROT 6.7  ALBUMIN 3.6    Recent Labs Lab 02/28/14 1727  LIPASE 18   No results for input(s): AMMONIA in the last 168 hours. CBC:  Recent Labs Lab 02/28/14 1727 03/01/14 0304  WBC 17.7* 7.5  NEUTROABS 15.3*  --   HGB 17.9* 11.8*  HCT 53.3* 36.6  MCV 93.8 94.8  PLT 129* 92*   Cardiac Enzymes:  Recent Labs Lab 02/28/14 1727 02/28/14 2103 03/01/14 0304 03/01/14 0854  TROPONINI 0.36* 0.31* 0.16* 0.11*   BNP: Invalid input(s): POCBNP CBG: No results  for input(s): GLUCAP in the last 168 hours.  Recent Results (from the past 240 hour(s))  MRSA PCR Screening     Status: None   Collection Time: 02/28/14 10:49 PM  Result Value Ref Range Status   MRSA by PCR NEGATIVE NEGATIVE Final     Scheduled Meds: . albuterol  2.5 mg Nebulization Q6H  . antiseptic oral rinse  7 mL Mouth Rinse q12n4p  . aspirin  325 mg Oral Daily  . calcium-vitamin D  1 tablet Oral BID WC  . chlorhexidine  15 mL Mouth Rinse BID  . feeding supplement (RESOURCE BREEZE)  1  Container Oral TID BM  . gabapentin  100 mg Oral BID  . nitroGLYCERIN  0.5 inch Topical 4 times per day  . pantoprazole (PROTONIX) IV  40 mg Intravenous Q12H  . piperacillin-tazobactam (ZOSYN)  IV  3.375 g Intravenous Q8H  . potassium chloride  10 mEq Intravenous Q1 Hr x 3  . vancomycin  500 mg Intravenous Q12H   Continuous Infusions: . sodium chloride 50 mL/hr at 02/28/14 2154  . heparin 750 Units/hr (03/01/14 0700)

## 2014-03-01 NOTE — Progress Notes (Signed)
eLink Physician-Brief Progress Note Patient Name: [name] [name] DOB: August 31, 1925 MRN: [id number]   Date of Service  03/01/2014  HPI/Events of Note  Patient admitted with acute abd pain, N/V/D.  Currently BP of 85/47 (61).  Camera check shows the patient to be sleeping with adequate sats and resp.  eICU Interventions  Plan: 500 cc NS bolus for BP support Nurse to contact primary team if patient remains hypotensive     Intervention Category Intermediate Interventions: Hypotension - evaluation and management  DETERDING,ELIZABETH 03/01/2014, 12:14 AM

## 2014-03-01 NOTE — Progress Notes (Signed)
CARE MANAGEMENT NOTE 03/01/2014  Patient:  [name] [name], [name] [name]   Account Number:  [id number]  Date Initiated:  03/01/2014  Documentation initiated by:  DAVIS,RHONDA  Subjective/Objective Assessment:   sepsis     Action/Plan:   home when stable   Anticipated DC Date:  03/04/2014   Anticipated DC Plan:  HOME/SELF CARE  In-house referral  NA      DC Planning Services  NA      [facility] Choice  NA   Choice offered to / List presented to:  NA   DME arranged  NA      DME agency  NA     [place] arranged  NA      [place] agency  NA   Status of service:  In process, will continue to follow Medicare Important Message given?   (If response is "NO", the following Medicare IM given date fields will be blank) Date Medicare IM given:   Medicare IM given by:   Date Additional Medicare IM given:   Additional Medicare IM given by:    Discharge Disposition:    Per UR Regulation:  Reviewed for med. necessity/level of care/duration of stay  If discussed at [place] of Stay Meetings, dates discussed:    Comments:  [id number]/Rhonda Eldridge Dace, BSN, CCN: [phone] Case management. Chart reviewed for discharge planning and present needs. Discharge needs: none present at time of review. Next chart review due:  [id number]

## 2014-03-02 DIAGNOSIS — J438 Other emphysema: Secondary | ICD-10-CM

## 2014-03-02 DIAGNOSIS — K51919 Ulcerative colitis, unspecified with unspecified complications: Secondary | ICD-10-CM

## 2014-03-02 LAB — CBC
HCT: 31.8 % — ABNORMAL LOW (ref 36.0–46.0)
Hemoglobin: 10.1 g/dL — ABNORMAL LOW (ref 12.0–15.0)
MCH: 31 pg (ref 26.0–34.0)
MCHC: 31.8 g/dL (ref 30.0–36.0)
MCV: 97.5 fL (ref 78.0–100.0)
Platelets: 83 10*3/uL — ABNORMAL LOW (ref 150–400)
RBC: 3.26 MIL/uL — ABNORMAL LOW (ref 3.87–5.11)
RDW: 14.3 % (ref 11.5–15.5)
WBC: 3.5 10*3/uL — ABNORMAL LOW (ref 4.0–10.5)

## 2014-03-02 MED ORDER — IPRATROPIUM-ALBUTEROL 0.5-2.5 (3) MG/3ML IN SOLN
3.0000 mL | Freq: Three times a day (TID) | RESPIRATORY_TRACT | Status: DC
  Administered 2014-03-02 – 2014-03-04 (×7): 3 mL via RESPIRATORY_TRACT
  Filled 2014-03-02 (×8): qty 3

## 2014-03-02 MED ORDER — HYDRALAZINE HCL 20 MG/ML IJ SOLN
10.0000 mg | Freq: Four times a day (QID) | INTRAMUSCULAR | Status: DC | PRN
  Administered 2014-03-03: 10 mg via INTRAVENOUS
  Filled 2014-03-02: qty 1

## 2014-03-02 NOTE — Progress Notes (Addendum)
Patient ID: [name] [name], female   DOB: 05-06-1925, 81 y.o.   MRN: [id number] [facility] PROGRESS NOTE  [name] [name] VOJ:[id number] DOB: 04-02-1925 DOA: 02/28/2014 PCP: [name] [name]., NP  Brief narrative:    81 -year-old female with past medical history hypertension, COPD who presented to [facility] ED with complaints of nausea and vomiting started 24 hours prior to this admission. Per family at the bedside, patient had fevers and chills. No reports of blood in the stool or melanotic stool. No reports of diarrhea. On admission, patient was hypotensive, tachycardic, tachypneic, febrile with oxygen saturation of 93% on nasal cannula oxygen support. Her blood work was significant for elevated white blood cell count of 17.7, platelets 129. Initial troponin level was 0.31. Chest x-ray showed no acute cardiopulmonary process. CT abdomen revealed diffuse acute colitis, bladder wall thickening suspicious for cystitis, possible right side focal pyelonephritis, relatively diffuse patchy right-sided pneumonia. Patient was started on broad-spectrum antibiotics, vancomycin and Zosyn for treatment of multiple infections including pneumonia, UTI, colitis, possible sepsis.   Assessment/Plan:    Principal problem: Severe sepsis / septic shock secondary to multiple infections, pneumonia, urinary tract infection and colitis / leukocytosis - Sepsis criteria met on the admission with vitals that included hypotension even with IV fluid challenge. Patient also tachycardic and increased RR. Oxygen saturation in low 90's  but as noted this is with nasal cannula oxygen support. Additionally blood work was significant for elevated white blood cell count of 17.7 and elevated lactic acid at 4.85. Urinalysis revealed many bacteria however leukocytes and nitrites were not detected on urinalysis. CT abdomen suspicious for focal right-sided pyelonephritis, relatively diffuse patchy right-sided pneumonia and diffuse colitis. -  Patient started on broad-spectrum antibiotics, vancomycin and Zosyn. We will continue these antibiotics for now. - Urine culture is growing gram-negative rods, final result is pending. Blood cultures to date are negative. - We will continue to monitor patient in step down unit for next 24 hours. She still has complaints of abdominal pain, intermittent dyspnea.  Active Problems: Community acquired pneumonia - Continue broad-spectrum antibiotics, vancomycin and Zosyn. CT abdomen showed possible patchy right-sided pneumonia. No acute cardiopulmonary disease was seen on chest x-ray. - Blood cultures to date show no growth.  Urinary tract infection secondary to gram-negative rods / possible right-sided pyelonephritis - Urinalysis on the admission revealed many bacteria however leukocytes and nitrites were not detected on this routine urinalysis. Urine culture is growing gram-negative rods, final culture result pending. - CT abdomen showed possible focal right-sided pyelonephritis. - Current antibiotics would cover for urinary tract infection and pyelonephritis.  Diffuse colitis - Diffuse colitis seen on CT scan. Family reported no diarrhea prior to this admission or during this admission. - Patient still has complaints of abdominal pain. -Continue current pain management efforts with Dilaudid 0.5-1 mg every 3 hours as needed for severe pain and oxycodone 1 tablet every 4 hours as needed for moderate pain.   Thrombocytopenia - Likely secondary to heparin drip the patient received on the admission. She was then started on Lovenox for DVT prophylaxis. Heparin stopped 03/01/2014. We'll stop Lovenox today and use SCDs for DVT prophylaxis.  Normocytic anemia - Initial hemoglobin elevated at 17 which is likely because of hemoconcentration. - Hemoglobin this morning 10.1 which is likely dilutional from IV fluids.  Hypokalemia - Due to GI losses - Being supplemented  Acute COPD exacerbation / acute  respiratory failure with hypoxia - Respiratory status stable. Will continue to use nebulizer treatment every 8 hours scheduled and  every 4 hours as needed for shortness of breath or wheezing.  NSTEMI (non-ST elevated myocardial infarction) - Troponin elevation seen on the admission, reached a plateau at 0.3 andsubsequently trended down. - Likely secondary to demand ischemia from sepsis, multiple infections. - Cardiology has seen the patient in consultation. No further workup recommended. Outpatient follow-up once patient discharged. - Patient was on heparin drip on the admission but per cardiology this was stopped 03/01/2014.   DVT Prophylaxis  - Lovenox subcutaneous ordered but we will stop this because of thrombocytopenia.   Code Status: Full.  Family Communication:  plan of care discussed with the patient's daughter Disposition Plan: Patient with complaints of pain. Tachycardic and still with transient episodes of dyspnea. Per discussion with patient's nurse it would be reasonable to keep patient in step down unit for additional 24 hour for observation.  IV access:  Peripheral IV  Procedures and diagnostic studies:    Ct Abdomen Pelvis W Contrast 02/28/2014  1. Diffuse acute colitis noted, with partial sparing of the ascending colon, and surrounding soft tissue inflammation and trace fluid. This is either infectious or inflammatory in nature. 2. Bladder wall thickening is suspicious for cystitis, as it is new from the prior study. 3. Vaguely decreased enhancement at the anterior aspect of the right kidney is apparently new from the prior study, and may reflect mild focal pyelonephritis. Would correlate for associated symptoms. 4. Relatively diffuse patchy right-sided pneumonia noted. 5. Trace ascites within the abdomen and pelvis is thought to reflect the colonic process. Mild edema about the small bowel and pancreas are thought to be reactive. This is most prominent about the first and second  segments of the duodenum, of uncertain significance. 6. Pneumobilia likely reflects prior instrumentation at the duodenal ampulla. 7. Scattered small bilateral renal cysts; mild left renal scarring. 8. Scattered calcification along the abdominal aorta and its branches. 9. Right convex thoracolumbar scoliosis, with underlying mild degenerative change.   Dg Chest Port 1 View 22/2016    No acute cardiopulmonary process.     Medical Consultants:  Cardiology Gastroenterology  Other Consultants:  Physical therapy   IAnti-Infectives:   Vancomycin 03/01/2014 --> Zosyn 02/[id number] -->   Leisa Lenz, MD  [facility] Pager [phone]  If 7PM-7AM, please contact night-coverage [email or web address] Password [email or web address] 03/02/2014, 9:26 AM   LOS: 2 days    HPI/Subjective: No acute overnight events.  Objective: Filed Vitals:   03/02/14 0735 03/02/14 0738 03/02/14 0757 03/02/14 0800  BP:    147/54  Pulse:    101  Temp:   98.2 F (36.8 C)   TempSrc:   Oral   Resp:    13  Height:      Weight:      SpO2: 100% 100%  100%    Intake/Output Summary (Last 24 hours) at 03/02/14 0926 Last data filed at 03/02/14 0800  Gross per 24 hour  Intake   1925 ml  Output   1250 ml  Net    675 ml    Exam:   General: Pt is sleeping this morning, easily aroused  Cardiovascular: tachycardic, S1/S2 appreciated  Respiratory: Bilateral air entry, no wheezing  Abdomen: Nontender abdomen, no distention, appreciated bowel sounds  Extremities: No edema, pulses palpable bilaterally  Neuro: No focal deficits  Data Reviewed: Basic Metabolic Panel:  Recent Labs Lab 02/28/14 1727 03/01/14 0304  NA 135 137  K 3.5 3.4*  CL 96 107  CO2 25 26  GLUCOSE 161* 117*  BUN 15 16  CREATININE 0.72 0.60  CALCIUM 8.2* 6.7*   Liver Function Tests:  Recent Labs Lab 02/28/14 1727  AST 41*  ALT 23  ALKPHOS 100  BILITOT 0.7  PROT 6.7  ALBUMIN 3.6    Recent Labs Lab 02/28/14 1727  LIPASE 18   No  results for input(s): AMMONIA in the last 168 hours. CBC:  Recent Labs Lab 02/28/14 1727 03/01/14 0304 03/02/14 0407  WBC 17.7* 7.5 3.5*  NEUTROABS 15.3*  --   --   HGB 17.9* 11.8* 10.1*  HCT 53.3* 36.6 31.8*  MCV 93.8 94.8 97.5  PLT 129* 92* 83*   Cardiac Enzymes:  Recent Labs Lab 02/28/14 1727 02/28/14 2103 03/01/14 0304 03/01/14 0854  TROPONINI 0.36* 0.31* 0.16* 0.11*   BNP: Invalid input(s): POCBNP CBG: No results for input(s): GLUCAP in the last 168 hours.  Blood Culture (routine x 2)     Status: None (Preliminary result)   Collection Time: 02/28/14  5:25 PM  Result Value Ref Range Status   Specimen Description BLOOD RIGHT WRIST  Final   Special Requests BOTTLES DRAWN AEROBIC ONLY 3ML  Final   Culture   Final           BLOOD CULTURE RECEIVED NO GROWTH TO DATE     Report Status PENDING  Incomplete  Blood Culture (routine x 2)     Status: None (Preliminary result)   Collection Time: 02/28/14  5:41 PM  Result Value Ref Range Status   Specimen Description BLOOD RAC  Final   Special Requests BOTTLES DRAWN AEROBIC ONLY 3ML  Final   Culture   Final           BLOOD CULTURE RECEIVED NO GROWTH TO DATE    Report Status PENDING  Incomplete  Urine culture     Status: None (Preliminary result)   Collection Time: 02/28/14  5:49 PM  Result Value Ref Range Status   Specimen Description URINE, CATHETERIZED  Final   Special Requests NONE  Final   Colony Count   Final   Culture   Final    GRAM NEGATIVE RODS Performed at [facility]    Report Status PENDING  Incomplete  MRSA PCR Screening     Status: None   Collection Time: 02/28/14 10:49 PM  Result Value Ref Range Status   MRSA by PCR NEGATIVE NEGATIVE Final     Scheduled Meds: . aspirin  325 mg Oral Daily  . calcium-vitamin D  1 tablet Oral BID WC  . enoxaparin (LOVENOX) injection  40 mg Subcutaneous Q24H  . feeding supplement (RESOURCE BREEZE)  1 Container Oral TID BM  . gabapentin  100 mg Oral BID  .  ipratropium-albuterol  3 mL Nebulization TID  . pantoprazole (PROTONIX) IV  40 mg Intravenous Q12H  . piperacillin-tazobactam (ZOSYN)  IV  3.375 g Intravenous Q8H  . vancomycin  500 mg Intravenous Q12H   Continuous Infusions: . sodium chloride 50 mL/hr at 03/01/14 2244

## 2014-03-02 NOTE — Clinical Documentation Improvement (Signed)
The impression of the pn states pt with CHF.   Please clarify the acuity and type of CHF for this admission.  Possible Clinical Conditions?  Chronic Systolic Congestive Heart Failure Chronic Diastolic Congestive Heart Failure Chronic Systolic & Diastolic Congestive Heart Failure Acute Systolic Congestive Heart Failure Acute Diastolic Congestive Heart Failure Acute Systolic & Diastolic Congestive Heart Failure Acute on Chronic Systolic Congestive Heart Failure Acute on Chronic Diastolic Congestive Heart Failure Acute on Chronic Systolic & Diastolic Congestive Heart Failure Other Condition________________________________________ Cannot Clinically Determine  Supporting Information:  Risk Factors: Sepsis, CHF, PNA, colitis, NSTEMI, pancytopenia.  Signs & Symptoms: Given fluids, careful fluid management given prior history of CHF and pulmonary edema.per ED notes Diagnostics: Treatment: monitoring Thank You, Heloise Beecham ,RN Clinical Documentation Specialist:  [place] Information Management

## 2014-03-02 NOTE — Progress Notes (Signed)
PT Cancellation Note  Patient Details Name: [name] [name] MRN: [id number] DOB: 03/24/1925   Cancelled Treatment:    Reason Eval/Treat Not Completed: Patient not medically ready (in AM had  HA per daughter,  recent hypertensive episode. check on pt  2/5.)   Claretha Cooper 03/02/2014, 4:46 PM Tresa Endo PT [phone]

## 2014-03-02 NOTE — Consult Note (Addendum)
Referring Provider: Dr. Charlies Silvers Primary Care Physician:  [name], NP Primary Gastroenterologist:  Dr. Watt Climes  Reason for Consultation:  Colitis  HPI: [name] [name] is a 81 y.o. female admitted with sepsis thought to be urologic in origin being seen for a consult due to colitis. A CT scan showed diffuse colitis with partial sparing of the ascending colon. She reports 2 episodes of nonbloody diarrhea the day before she came to the hospital. Denies melena, hematochezia, abdominal pain. Patient started on broad spectrum antibiotics due to pneumonia and concern for pyelonephritis. Elevated lactic acid on admit with tachycardia and BP in the low 637'C systolic. History of pancreatitis. Colonoscopy in 12/14 was incomplete due to poor prep but visualized mucosa normal and biopsies negative for colitis (pancolitis on CT at that time). EGD in 10/15 showed minimal gastritis that done due to hematemesis. No family at bedside.    Past Medical History  Diagnosis Date  . Osteoporosis   . Arthritis     osteoarthritis  . GERD (gastroesophageal reflux disease)   . Hiatal hernia   . PUD (peptic ulcer disease)   . Cardiac arrhythmia   . Hyperlipidemia   . Heart failure   . Hypertension   . Scoliosis deformity of spine     history of intractable back pain  . Choledocholithiasis   . Pancolitis     Infectious vs. inflammatory  . Abdominal pain     Resolved  . Chronic anemia   . Chronic back pain   . Pancreatitis     History of  . CHF (congestive heart failure)     Diastolic  on ECHO 5885  . COPD (chronic obstructive pulmonary disease)     oxygen dependent at  night 2LPM    Past Surgical History  Procedure Laterality Date  . Cholecystectomy  2008  . Abdominal hysterectomy  1970s  . Spine surgery  [phone]  . Neck surgery  1970s  . Spinal cord stimulator implant    . Ercp w/ sphicterotomy  02/2010  . Back surgery      x 5  . Pain pump implantation  2009    back, dilaudid  and bupravacaine  . Flexible sigmoidoscopy N/A 01/24/2013    Procedure: FLEXIBLE SIGMOIDOSCOPY;  Surgeon: Missy Sabins, MD;  Location: [place];  Service: Endoscopy;  Laterality: N/A;  . Esophagogastroduodenoscopy N/A 11/14/2013    Procedure: ESOPHAGOGASTRODUODENOSCOPY (EGD);  Surgeon: Missy Sabins, MD;  Location: [facility] ENDOSCOPY;  Service: Endoscopy;  Laterality: N/A;    Prior to Admission medications   Medication Sig Start Date End Date Taking? Authorizing Provider  albuterol (PROVENTIL HFA;VENTOLIN HFA) 108 (90 BASE) MCG/ACT inhaler Inhale 1-2 puffs into the lungs 3 (three) times daily as needed for wheezing or shortness of breath (wheezing & shortness of breath).    Yes Historical Provider, MD  Ascorbic Acid (VITAMIN C) 500 MG tablet Take 500 mg by mouth daily.     Yes Historical Provider, MD  budesonide-formoterol (SYMBICORT) 160-4.5 MCG/ACT inhaler Inhale 2 puffs into the lungs daily as needed (shortness of breath).  11/01/12  Yes Elsie Stain, MD  butalbital-acetaminophen-caffeine (FIORICET, ESGIC) [phone] MG per tablet Take 1 tablet by mouth every 8 (eight) hours as needed for headache (headache).    Yes Historical Provider, MD  Calcium Carbonate-Vitamin D (CALCIUM 600-D) 600-400 MG-UNIT per tablet Take 1 tablet by mouth 2 (two) times daily with a meal.     Yes Historical Provider, MD  Clotrimazole 1 % OINT Take 1 application  by mouth 2 (two) times daily as needed (mouth sores).  10/31/13  Yes Debbrah Alar, NP  diclofenac sodium (VOLTAREN) 1 % GEL Apply 1 application topically 4 (four) times daily as needed. Inflammation 09/15/12  Yes Debbrah Alar, NP  gabapentin (NEURONTIN) 100 MG capsule Take 1 capsule (100 mg total) by mouth 2 (two) times daily. 08/23/13  Yes Debbrah Alar, NP  lactulose (CHRONULAC) 10 GM/15ML solution Take 10 g by mouth daily as needed for mild constipation (constipation). 10 ml by mouth once daily at bedtime 03/30/13  Yes Debbrah Alar, NP   omeprazole (PRILOSEC) 40 MG capsule Take 1 capsule (40 mg total) by mouth 2 (two) times daily. 11/16/13  Yes Thurnell Lose, MD  oxyCODONE-acetaminophen (PERCOCET/ROXICET) 5-325 MG per tablet Take 1 tablet by mouth every 6 (six) hours as needed for moderate pain or severe pain (pain).    Yes Historical Provider, MD  VITAMIN E PO Take 1 tablet by mouth daily.    Yes Historical Provider, MD  nitroGLYCERIN (NITROSTAT) 0.4 MG SL tablet Place 1 tablet (0.4 mg total) under the tongue every 5 (five) minutes as needed for chest pain. 11/11/10   Darlin Coco, MD  polyethylene glycol [facility] / GLYCOLAX) packet Take 17 g by mouth daily as needed for mild constipation or moderate constipation. 01/27/13   Donne Hazel, MD    Scheduled Meds: . antiseptic oral rinse  7 mL Mouth Rinse q12n4p  . aspirin  325 mg Oral Daily  . calcium-vitamin D  1 tablet Oral BID WC  . chlorhexidine  15 mL Mouth Rinse BID  . enoxaparin (LOVENOX) injection  40 mg Subcutaneous Q24H  . feeding supplement (RESOURCE BREEZE)  1 Container Oral TID BM  . gabapentin  100 mg Oral BID  . ipratropium-albuterol  3 mL Nebulization TID  . pantoprazole (PROTONIX) IV  40 mg Intravenous Q12H  . piperacillin-tazobactam (ZOSYN)  IV  3.375 g Intravenous Q8H  . vancomycin  500 mg Intravenous Q12H   Continuous Infusions: . sodium chloride 50 mL/hr at 03/01/14 2244   PRN Meds:.acetaminophen **OR** acetaminophen, alum & mag hydroxide-simeth, HYDROmorphone (DILAUDID) injection, ipratropium-albuterol, nitroGLYCERIN, ondansetron **OR** ondansetron (ZOFRAN) IV, oxyCODONE-acetaminophen  Allergies as of 02/28/2014 - Review Complete 02/28/2014  Allergen Reaction Noted  . Codeine    . Morphine    . Sulfa antibiotics  10/13/2011  . Zoledronic acid  05/10/2009    Family History  Problem Relation Age of Onset  . Cancer Mother     uterine  . Heart disease Father   . Hypertension Other     History   Social History  . Marital Status:  Widowed    Spouse Name: N/A    Number of Children: 3  . Years of Education: N/A   Occupational History  .     Social History Main Topics  . Smoking status: Former Smoker -- 1.00 packs/day for 12 years    Types: Cigarettes    Quit date: 01/28/1988  . Smokeless tobacco: Never Used  . Alcohol Use: No  . Drug Use: No  . Sexual Activity: No   Other Topics Concern  . Not on file   Social History Narrative   1 grandchild at care link--Angelia   Lives with great-granddaughter    Review of Systems: All negative except as stated above in HPI.  Physical Exam: Vital signs: Filed Vitals:   03/02/14 0800  BP: 147/54  Pulse: 101  Temp: 98.2  Resp: 13     General:  Elderly, frail, awake, oriented X 3, no acute distress Lungs:  Coarse breath sounds Heart:  Regular rate and rhythm; no murmurs, clicks, rubs,  or gallops. Abdomen: periumbilical tenderness with minimal guarding, hardware noted in RLQ, soft, +BS  Rectal:  Deferred Ext: no edema  GI:  Lab Results:  Recent Labs  02/28/14 1727 03/01/14 0304 03/02/14 0407  WBC 17.7* 7.5 3.5*  HGB 17.9* 11.8* 10.1*  HCT 53.3* 36.6 31.8*  PLT 129* 92* 83*   BMET  Recent Labs  02/28/14 1727 03/01/14 0304  NA 135 137  K 3.5 3.4*  CL 96 107  CO2 25 26  GLUCOSE 161* 117*  BUN 15 16  CREATININE 0.72 0.60  CALCIUM 8.2* 6.7*   LFT  Recent Labs  02/28/14 1727  PROT 6.7  ALBUMIN 3.6  AST 41*  ALT 23  ALKPHOS 100  BILITOT 0.7   PT/INR  Recent Labs  02/28/14 1727  LABPROT 13.0  INR 0.97     Studies/Results: Ct Abdomen Pelvis W Contrast  02/28/2014   CLINICAL DATA:  Acute onset of generalized weakness. Abdominal pain, nausea and vomiting. Initial encounter.  EXAM: CT ABDOMEN AND PELVIS WITH CONTRAST  TECHNIQUE: Multidetector CT imaging of the abdomen and pelvis was performed using the standard protocol following bolus administration of intravenous contrast.  CONTRAST:  158mL OMNIPAQUE IOHEXOL 300 MG/ML  SOLN   COMPARISON:  CT of the abdomen and pelvis from 11/14/2013  FINDINGS: Patchy right-sided airspace opacities are compatible with pneumonia.  The liver and spleen are unremarkable in appearance. The patient is status post cholecystectomy, with clips noted along the gallbladder fossa. Pneumobilia likely reflects prior instrumentation at the duodenal ampulla.  Vague stranding about the pancreas is similar in appearance to the stranding about small bowel loops, with fluid tracking about the second segment of the duodenum. This is thought to reflect the acute colonic process described below. Trace associated ascites is seen within the abdomen and pelvis. The adrenal glands are unremarkable in appearance.  Vaguely decreased enhancement is noted at the anterior aspect of the right kidney, of uncertain significance and slightly less apparent on delayed images. Would correlate for evidence of mild focal pyelonephritis. Mild left renal scarring is noted. Scattered small bilateral renal cysts are seen. There is no evidence of hydronephrosis. No renal or ureteral stones are identified.  There is mild edema noted with regard to the first and second segments of the duodenum, though this may be reactive in nature. Remaining visualized small bowel loops are grossly unremarkable. The stomach is within normal limits. No acute vascular abnormalities are seen. Scattered calcification is seen along the abdominal aorta and its branches. The abdominal aorta is somewhat tortuous in appearance.  The appendix is not definitely seen; there is no evidence of appendicitis.  There is diffuse wall thickening and edema noted along much of the colon, with partial sparing of the ascending colon. Surrounding soft tissue inflammation and trace fluid are seen, particularly along the transverse and sigmoid colon. Findings are compatible with acute colitis, either infectious or inflammatory in nature. The distribution suggests against ischemia.  A metallic  device is noted at the right lower quadrant anterior abdominal wall. An additional thoracic spine simulation device is noted at the left flank, with a lead extending up the thoracic spine.  The bladder is mildly distended. Apparent bladder wall thickening is suspicious for cystitis, as it is new from the prior study. The patient is status post hysterectomy. No suspicious adnexal masses are seen.  No inguinal lymphadenopathy is seen.  No acute osseous abnormalities are identified. Right convex thoracolumbar scoliosis is noted. Underlying vacuum phenomenon and endplate sclerotic change are seen.  IMPRESSION: 1. Diffuse acute colitis noted, with partial sparing of the ascending colon, and surrounding soft tissue inflammation and trace fluid. This is either infectious or inflammatory in nature. 2. Bladder wall thickening is suspicious for cystitis, as it is new from the prior study. 3. Vaguely decreased enhancement at the anterior aspect of the right kidney is apparently new from the prior study, and may reflect mild focal pyelonephritis. Would correlate for associated symptoms. 4. Relatively diffuse patchy right-sided pneumonia noted. 5. Trace ascites within the abdomen and pelvis is thought to reflect the colonic process. Mild edema about the small bowel and pancreas are thought to be reactive. This is most prominent about the first and second segments of the duodenum, of uncertain significance. 6. Pneumobilia likely reflects prior instrumentation at the duodenal ampulla. 7. Scattered small bilateral renal cysts; mild left renal scarring. 8. Scattered calcification along the abdominal aorta and its branches. 9. Right convex thoracolumbar scoliosis, with underlying mild degenerative change.  These results were called by telephone at the time of interpretation on 02/28/2014 at 7:33 pm to Dr. Sherwood Gambler, who verbally acknowledged these results.   Electronically Signed   By: Garald Balding M.D.   On: 02/28/2014 19:37    Dg Chest Port 1 View  02/28/2014   CLINICAL DATA:  Fever, nausea, vomiting  EXAM: PORTABLE CHEST - 1 VIEW  COMPARISON:  Radiograph 11/13/2013  FINDINGS: Normal cardiac silhouette. There is fine bronchitic markings in the lungs. No focal infiltrate. No effusion. No pneumothorax.  IMPRESSION: No acute cardiopulmonary process.   Electronically Signed   By: Suzy Bouchard M.D.   On: 02/28/2014 18:24    Impression/Plan: 81 yo with urosepsis being seen for a consult due to diffuse colitis. Suspect gastroenteritis. Doubt inflammatory bowel disease. Ischemic colitis possible but less likely. Pancytopenia noted after initially having leukocytosis on admit. Without any bloody diarrhea would hold off on a sigmoidoscopy and await stool studies. Continue supportive care. Broad-spectrum antibiotics. Will follow.    LOS: 2 days   [place] C.  03/02/2014, 9:16 AM

## 2014-03-02 NOTE — Progress Notes (Signed)
Mild troponin elevation in the setting of sepsis, without angina. Last stress test was in 2010 which was non-ischemic. Agree that troponin elevation is likely due to demand ischemia/sepsis. No further cardiac work-up at this time.  Could consider outpatient stress testing with Dr. Mare Ferrari.  Recommend follow-up with him. Cardiology will sign-off. Call with questions.  Pixie Casino, MD, [facility] Attending Cardiologist [place]

## 2014-03-02 NOTE — Progress Notes (Signed)
Patient hypertensive with SBP in 180's. Text-paged MD Charlies Silvers to make aware. Will continue to monitor.

## 2014-03-03 ENCOUNTER — Other Ambulatory Visit: Payer: Self-pay

## 2014-03-03 LAB — CBC
HCT: 35.7 % — ABNORMAL LOW (ref 36.0–46.0)
Hemoglobin: 11.3 g/dL — ABNORMAL LOW (ref 12.0–15.0)
MCH: 30.9 pg (ref 26.0–34.0)
MCHC: 31.7 g/dL (ref 30.0–36.0)
MCV: 97.5 fL (ref 78.0–100.0)
Platelets: 93 10*3/uL — ABNORMAL LOW (ref 150–400)
RBC: 3.66 MIL/uL — ABNORMAL LOW (ref 3.87–5.11)
RDW: 13.8 % (ref 11.5–15.5)
WBC: 3.7 10*3/uL — ABNORMAL LOW (ref 4.0–10.5)

## 2014-03-03 LAB — TROPONIN I
Troponin I: <0.03 ng/mL (ref <0.031)
Troponin I: <0.03 ng/mL (ref <0.031)

## 2014-03-03 LAB — URINE CULTURE: Colony Count: >=100000

## 2014-03-03 LAB — PROCALCITONIN: Procalcitonin: 0.36 ng/mL

## 2014-03-03 LAB — CLOSTRIDIUM DIFFICILE BY PCR: Toxigenic C. Difficile by PCR: NEGATIVE

## 2014-03-03 MED ORDER — HEPARIN SODIUM (PORCINE) 5000 UNIT/ML IJ SOLN: 5000.0000 [IU] | Freq: Three times a day (TID) | INTRAMUSCULAR | Status: DC

## 2014-03-03 MED ORDER — METOPROLOL TARTRATE 1 MG/ML IV SOLN: 2.5000 mg | Freq: Four times a day (QID) | INTRAVENOUS | Status: DC

## 2014-03-03 MED ORDER — METOPROLOL TARTRATE 1 MG/ML IV SOLN
2.5000 mg | Freq: Three times a day (TID) | INTRAVENOUS | Status: DC
  Administered 2014-03-03: 2.5 mg via INTRAVENOUS
  Filled 2014-03-03: qty 5

## 2014-03-03 MED ORDER — BUTALBITAL-APAP-CAFFEINE 50-325-40 MG PO TABS
1.0000 | ORAL_TABLET | ORAL | Status: DC | PRN
  Administered 2014-03-03 – 2014-03-09 (×16): 1 via ORAL
  Filled 2014-03-03 (×16): qty 1

## 2014-03-03 MED ORDER — METOPROLOL TARTRATE 50 MG PO TABS
50.0000 mg | ORAL_TABLET | Freq: Two times a day (BID) | ORAL | Status: DC
  Administered 2014-03-03 – 2014-03-09 (×13): 50 mg via ORAL
  Filled 2014-03-03: qty 1
  Filled 2014-03-03: qty 2
  Filled 2014-03-03 (×4): qty 1
  Filled 2014-03-03 (×4): qty 2
  Filled 2014-03-03 (×2): qty 1
  Filled 2014-03-03 (×2): qty 2

## 2014-03-03 NOTE — Progress Notes (Signed)
PT Cancellation Note  Patient Details Name: [name] [name] MRN: [id number] DOB: [birth year]   Cancelled Treatment:    Reason Eval/Treat Not Completed: Medical issues which prohibited therapy (pt having nausea and vomiting this morning. Will re-attempt later today. )   Philomena Doheny 03/03/2014, 9:10 AM  [phone]

## 2014-03-03 NOTE — Progress Notes (Addendum)
Patient ID: [name] [name], female   DOB: 21-Jan-1926, 81 y.o.   MRN: [id number] [facility] PROGRESS NOTE  Rye Dorado FVC:[id number] DOB: 07-13-1925 DOA: 02/28/2014 PCP: [name], NP  Brief narrative:    81 year-old female with past medical history hypertension, COPD who presented to [facility] ED with complaints of nausea and vomiting started 24 hours prior to this admission. Per family at the bedside, patient had fevers and chills. No reports of blood in the stool or melanotic stool. No reports of diarrhea.  On admission, patient was hypotensive, tachycardic, tachypneic, febrile with oxygen saturation of 93% on nasal cannula oxygen support. Her blood work was significant for elevated white blood cell count of 17.7, platelets 129. Initial troponin level was 0.31. Chest x-ray showed no acute cardiopulmonary process. CT abdomen revealed diffuse acute colitis, bladder wall thickening suspicious for cystitis, possible right side focal pyelonephritis, relatively diffuse patchy right-sided pneumonia. Patient was started on broad-spectrum antibiotics, vancomycin and Zosyn for treatment of multiple infections including pneumonia, UTI, colitis, possible sepsis. GI and cardiology on board.   Assessment/Plan:    Principal problem: Severe sepsis / septic shock secondary to multiple infections, pneumonia, Klebsiella urinary tract infection and colitis / leukocytosis - Sepsis criteria met on the admission with vitals that included hypotension even with IV fluid challenge. Patient was also tachycardic, increased work of breathing with tachypnea. Her oxygen saturation was in the low 90s but she was on a nasal cannula oxygen support. Blood work was significant for elevated white blood cell count of 17.7 and elevated lactic acid at 4.85. Urinalysis revealed many bacteria however leukocytes and nitrites were not detected on urinalysis. CT abdomen suspicious for focal right-sided pyelonephritis, relatively  diffuse patchy right-sided pneumonia and diffuse colitis. - Patient was started on broad-spectrum antibiotics, vancomycin and Zosyn. Urine culture is growing Klebsiella sensitive to Zosyn. No evidence of staff/MRSA infection so we will stop vancomycin today 03/03/2014 - Blood cultures to date are negative. - Patient was initially hypotensive but now blood pressure is high, 181/76. We'll continue to monitor in step down unit for next 24 hours.  Active Problems: Community acquired pneumonia - As mentioned above, patient was started on broad-spectrum antibiotics, vancomycin and Zosyn. Pneumonia evidenced on CT abdomen, possible patchy right-sided pneumonia. - No evidence of MRSA infection so we will stop vancomycin today 03/03/2013. Continue Zosyn. - Blood cultures so far show no growth.  Urinary tract infection secondary to Klebsiella pneumonia / possible right-sided pyelonephritis - Urinalysis on the admission revealed many bacteria however leukocytes and nitrites were not detected on routine urinalysis.  - Urine culture growing Klebsiella pneumonia. Sensitive to Zosyn. As noted above, vancomycin stopped.  Diffuse colitis - Diffuse colitis seen on CT scan. Stool studies not stent because patient did not have diarrhea. - GI has seen the patient in consultation, does not plan to do sigmoidoscopy at this time. -Continue current pain management efforts with Dilaudid 0.5-1 mg every 3 hours as needed for severe pain and oxycodone 1 tablet every 4 hours as needed for moderate pain.   Thrombocytopenia - Likely secondary to heparin drip she received on the admission. She was then on Lovenox for DVT prophylaxis. - Heparin stopped 03/01/2014, Lovenox stopped 03/02/2014.  - Patient is now on SCDs for DVT prophylaxis. Platelet count is stable at 93. - No reports of bleeding.  Normocytic anemia - Initial hemoglobin elevated at 17 which is likely secondary to hemoconcentration. - Hemoglobin level is  stable at 11.3. No current indications for transfusion.  Hypokalemia - Due to GI losses - Supplemented.  Acute COPD exacerbation / acute respiratory failure with hypoxia - Respiratory status stable.  - Will continue to use nebulizer treatment every 8 hours scheduled and every 4 hours as needed for shortness of breath or wheezing.  NSTEMI (non-ST elevated myocardial infarction) - Troponin elevation seen on the admission, reached a plateau at 0.3 andsubsequently trended down. This is secondary to demand ischemia from severe sepsis and septic shock. - Cardiology evaluated the patient. Recommended stopping heparin drip. Outpatient follow-up once she is stable for discharge.  DVT Prophylaxis  - SCDs bilaterally because of thrombocytopenia.   Code Status: Full.  Family Communication:  plan of care discussed with the patient's daughter and granddaughter at the bedside  Disposition Plan: Patient with stay in SDU for additional 24 hours due to accelerated hypertension, acuity of illness, severity of illness  IV access:  Peripheral IV  Procedures and diagnostic studies:    Ct Abdomen Pelvis W Contrast 02/28/2014  1. Diffuse acute colitis noted, with partial sparing of the ascending colon, and surrounding soft tissue inflammation and trace fluid. This is either infectious or inflammatory in nature. 2. Bladder wall thickening is suspicious for cystitis, as it is new from the prior study. 3. Vaguely decreased enhancement at the anterior aspect of the right kidney is apparently new from the prior study, and may reflect mild focal pyelonephritis. Would correlate for associated symptoms. 4. Relatively diffuse patchy right-sided pneumonia noted. 5. Trace ascites within the abdomen and pelvis is thought to reflect the colonic process. Mild edema about the small bowel and pancreas are thought to be reactive. This is most prominent about the first and second segments of the duodenum, of uncertain significance.  6. Pneumobilia likely reflects prior instrumentation at the duodenal ampulla. 7. Scattered small bilateral renal cysts; mild left renal scarring. 8. Scattered calcification along the abdominal aorta and its branches. 9. Right convex thoracolumbar scoliosis, with underlying mild degenerative change.   Dg Chest Port 1 View 22/2016    No acute cardiopulmonary process.     Medical Consultants:  Cardiology Gastroenterology  Other Consultants:  Physical therapy   IAnti-Infectives:   Vancomycin 03/01/2014 --> 03/03/2014  Zosyn [id number] -->    Leisa Lenz, MD  [facility] Pager [phone]  If 7PM-7AM, please contact night-coverage [email or web address] Password [email or web address] 03/03/2014, 11:18 AM   LOS: 3 days    HPI/Subjective: No acute overnight events.  Objective: Filed Vitals:   03/03/14 0400 03/03/14 0600 03/03/14 0726 03/03/14 0800  BP: 138/49 140/47  181/76  Pulse: 72 68  102  Temp: 98.7 F (37.1 C)   98.1 F (36.7 C)  TempSrc: Oral   Oral  Resp: $Remo'13 10  15  'Lwuts$ Height:      Weight:      SpO2: 98% 100% 99% 100%    Intake/Output Summary (Last 24 hours) at 03/03/14 1118 Last data filed at 03/03/14 0900  Gross per 24 hour  Intake   1690 ml  Output   1200 ml  Net    490 ml    Exam:   General:  Pt is alert, follows commands appropriately, not in acute distress  Cardiovascular: Regular rate and rhythm, S1/S2, no murmurs  Respiratory: Clear to auscultation bilaterally, no wheezing, no crackles, no rhonchi  Abdomen: Soft, non tender, non distended, bowel sounds present  Extremities: No edema, pulses DP and PT palpable bilaterally  Neuro: Grossly nonfocal  Data Reviewed: Basic Metabolic Panel:  Recent Labs Lab 02/28/14 1727  03/01/14 0304  NA 135 137  K 3.5 3.4*  CL 96 107  CO2 25 26  GLUCOSE 161* 117*  BUN 15 16  CREATININE 0.72 0.60  CALCIUM 8.2* 6.7*   Liver Function Tests:  Recent Labs Lab 02/28/14 1727  AST 41*  ALT 23  ALKPHOS 100  BILITOT 0.7   PROT 6.7  ALBUMIN 3.6    Recent Labs Lab 02/28/14 1727  LIPASE 18   No results for input(s): AMMONIA in the last 168 hours. CBC:  Recent Labs Lab 02/28/14 1727 03/01/14 0304 03/02/14 0407 03/03/14 0413  WBC 17.7* 7.5 3.5* 3.7*  NEUTROABS 15.3*  --   --   --   HGB 17.9* 11.8* 10.1* 11.3*  HCT 53.3* 36.6 31.8* 35.7*  MCV 93.8 94.8 97.5 97.5  PLT 129* 92* 83* 93*   Cardiac Enzymes:  Recent Labs Lab 02/28/14 1727 02/28/14 2103 03/01/14 0304 03/01/14 0854  TROPONINI 0.36* 0.31* 0.16* 0.11*   BNP: Invalid input(s): POCBNP CBG: No results for input(s): GLUCAP in the last 168 hours.  Recent Results (from the past 240 hour(s))  Blood Culture (routine x 2)     Status: None (Preliminary result)   Collection Time: 02/28/14  5:25 PM  Result Value Ref Range Status   Specimen Description BLOOD RIGHT WRIST  Final   Special Requests BOTTLES DRAWN AEROBIC ONLY 3ML  Final   Culture   Final           BLOOD CULTURE RECEIVED NO GROWTH TO DATE CULTURE WILL BE HELD FOR 5 DAYS BEFORE ISSUING A FINAL NEGATIVE REPORT Performed at [facility]    Report Status PENDING  Incomplete  Blood Culture (routine x 2)     Status: None (Preliminary result)   Collection Time: 02/28/14  5:41 PM  Result Value Ref Range Status   Specimen Description BLOOD RAC  Final   Special Requests BOTTLES DRAWN AEROBIC ONLY 3ML  Final   Culture   Final           BLOOD CULTURE RECEIVED NO GROWTH TO DATE CULTURE WILL BE HELD FOR 5 DAYS BEFORE ISSUING A FINAL NEGATIVE REPORT Performed at [facility]    Report Status PENDING  Incomplete  Urine culture     Status: None   Collection Time: 02/28/14  5:49 PM  Result Value Ref Range Status   Specimen Description URINE, CATHETERIZED  Final   Special Requests NONE  Final   Colony Count   Final    >=100,000 COLONIES/ML Performed at [facility]    Culture   Final    KLEBSIELLA PNEUMONIAE Performed at [facility]    Report  Status 03/03/2014 FINAL  Final   Organism ID, Bacteria KLEBSIELLA PNEUMONIAE  Final      Susceptibility   Klebsiella pneumoniae - MIC*    AMPICILLIN >=32 RESISTANT Resistant     CEFAZOLIN <=4 SENSITIVE Sensitive     CEFTRIAXONE <=1 SENSITIVE Sensitive     CIPROFLOXACIN <=0.25 SENSITIVE Sensitive     GENTAMICIN <=1 SENSITIVE Sensitive     LEVOFLOXACIN <=0.12 SENSITIVE Sensitive     NITROFURANTOIN 128 RESISTANT Resistant     TOBRAMYCIN <=1 SENSITIVE Sensitive     TRIMETH/SULFA <=20 SENSITIVE Sensitive     PIP/TAZO <=4 SENSITIVE Sensitive     * KLEBSIELLA PNEUMONIAE  MRSA PCR Screening     Status: None   Collection Time: 02/28/14 10:49 PM  Result Value Ref Range Status   MRSA by PCR NEGATIVE  NEGATIVE Final     Scheduled Meds: . aspirin  325 mg Oral Daily  . calcium-vitamin D  1 tablet Oral BID WC  . feeding supplement (RESOURCE BREEZE)  1 Container Oral TID BM  . gabapentin  100 mg Oral BID  . ipratropium-albuterol  3 mL Nebulization TID  . metoprolol  2.5 mg Intravenous 3 times per day  . pantoprazole (PROTONIX) IV  40 mg Intravenous Q12H  . piperacillin-tazobactam (ZOSYN)  IV  3.375 g Intravenous Q8H   Continuous Infusions: . sodium chloride 50 mL/hr at 03/02/14 2139

## 2014-03-03 NOTE — Progress Notes (Signed)
PT Cancellation Note  Patient Details Name: [name] [name] MRN: [id number] DOB: [birth year]   Cancelled Treatment:    Reason Eval/Treat Not Completed: Medical issues which prohibited therapy (Pt in a-fib, hold PT per RN.) Will follow.    Blondell Reveal Kistler 03/03/2014, 11:07 AM [phone]

## 2014-03-03 NOTE — Progress Notes (Signed)
TELEMETRY: Reviewed telemetry pt in sinus tachycardia with brief runs of atrial fibrillation: Filed Vitals:   03/03/14 1130 03/03/14 1140 03/03/14 1200 03/03/14 1230  BP:  162/58 154/69 161/80  Pulse:  63 76 101  Temp: 98.7 F (37.1 C)     TempSrc: Oral     Resp:  13 13 12   Height:      Weight:      SpO2:  100% 100% 100%    Intake/Output Summary (Last 24 hours) at 03/03/14 1241 Last data filed at 03/03/14 0900  Gross per 24 hour  Intake   1590 ml  Output    800 ml  Net    790 ml   Filed Weights   02/28/14 1710 02/28/14 2151  Weight: 120 lb (54.432 kg) 118 lb 2.7 oz (53.6 kg)    Subjective Patient seen in consult on 12/30/14 for elevated troponin. This was felt to be demand ischemia related to sepsis. Cardiology signed off yesterday. Today patient complained of feeling poorly. Complains of HA. Had diarrhea this am. Feels nauseated. Developed a "tightness" across her chest radiating into her left jaw. Maybe a little better with sl Ntg.   [name] antiseptic oral rinse  7 mL Mouth Rinse q12n4p  . aspirin  325 mg Oral Daily  . calcium-vitamin D  1 tablet Oral BID WC  . chlorhexidine  15 mL Mouth Rinse BID  . feeding supplement (RESOURCE BREEZE)  1 Container Oral TID BM  . gabapentin  100 mg Oral BID  . heparin subcutaneous  5,000 Units Subcutaneous 3 times per day  . ipratropium-albuterol  3 mL Nebulization TID  . metoprolol  2.5 mg Intravenous 3 times per day  . metoprolol tartrate  50 mg Oral BID  . pantoprazole (PROTONIX) IV  40 mg Intravenous Q12H  . piperacillin-tazobactam (ZOSYN)  IV  3.375 g Intravenous Q8H   . sodium chloride 50 mL/hr at 03/02/14 2139    LABS: Basic Metabolic Panel:  Recent Labs  02/28/14 1727 03/01/14 0304  NA 135 137  K 3.5 3.4*  CL 96 107  CO2 25 26  GLUCOSE 161* 117*  BUN 15 16  CREATININE 0.72 0.60  CALCIUM 8.2* 6.7*   Liver Function Tests:  Recent Labs  02/28/14 1727  AST 41*  ALT 23  ALKPHOS 100  BILITOT 0.7  PROT 6.7    ALBUMIN 3.6    Recent Labs  02/28/14 1727  LIPASE 18   CBC:  Recent Labs  02/28/14 1727  03/02/14 0407 03/03/14 0413  WBC 17.7*  < > 3.5* 3.7*  NEUTROABS 15.3*  --   --   --   HGB 17.9*  < > 10.1* 11.3*  HCT 53.3*  < > 31.8* 35.7*  MCV 93.8  < > 97.5 97.5  PLT 129*  < > 83* 93*  < > = values in this interval not displayed. Cardiac Enzymes:  Recent Labs  02/28/14 2103 03/01/14 0304 03/01/14 0854  TROPONINI 0.31* 0.16* 0.11*   BNP: No results for input(s): PROBNP in the last 72 hours. D-Dimer: No results for input(s): DDIMER in the last 72 hours. Hemoglobin A1C: No results for input(s): HGBA1C in the last 72 hours. Fasting Lipid Panel: No results for input(s): CHOL, HDL, LDLCALC, TRIG, CHOLHDL, LDLDIRECT in the last 72 hours. Thyroid Function Tests: No results for input(s): TSH, T4TOTAL, T3FREE, THYROIDAB in the last 72 hours.  Invalid input(s): FREET3   Radiology/Studies:  No results found.  Ecg: sinus tachy with HR  124. Intermittent bursts of Afib with RVR.  PHYSICAL EXAM General: Elderly WF appears ill.  Head: Normocephalic, atraumatic, sclera non-icteric, oropharynx is clear Neck: Negative for carotid bruits. JVD not elevated. No adenopathy Lungs: diminished BS bilateral. Scattered rhonchi. Rales left base. Heart: tachy, RRR S1 S2 without murmurs, rubs, or gallops.  Abdomen: Soft, non-tender, non-distended with normoactive bowel sounds. No hepatomegaly. No rebound/guarding. No obvious abdominal masses. Msk:  Strength and tone appears normal for age. Extremities: No clubbing, cyanosis or edema.  Distal pedal pulses are 2+ and equal bilaterally. Neuro: Alert and oriented X 3. Moves all extremities spontaneously. Psych:  Responds to questions appropriately with a normal affect.  ASSESSMENT AND PLAN: 1. Chest pain. Etiology is unclear. May be still demand ischemia in setting of multiple acute illnesses/infection. Will recycle cardiac enzymes. Ecg shows  no acute ST-T wave changes. She is tachycardic and hypertensive. Will start po metoprolol.  2. Paroxysmal atrial fibrillation. Due to multiple stressors. Will see how she responds to beta blocker. If sustained may need to consider IV diltiazem.  3. Sepsis with multiple infections. PNA, UTI, and colitis.   4. COPD  Present on Admission:  . Severe sepsis . Pneumonia . COPD with emphysema, gold stage C. . NSTEMI (non-ST elevated myocardial infarction) . Pancolitis . Essential hypertension  Signed, Allen Basista [place], [place] 03/03/2014 12:41 PM

## 2014-03-04 DIAGNOSIS — I10 Essential (primary) hypertension: Secondary | ICD-10-CM

## 2014-03-04 DIAGNOSIS — R079 Chest pain, unspecified: Secondary | ICD-10-CM

## 2014-03-04 DIAGNOSIS — I480 Paroxysmal atrial fibrillation: Secondary | ICD-10-CM

## 2014-03-04 LAB — CBC
HCT: 34 % — ABNORMAL LOW (ref 36.0–46.0)
Hemoglobin: 11 g/dL — ABNORMAL LOW (ref 12.0–15.0)
MCH: 31.2 pg (ref 26.0–34.0)
MCHC: 32.4 g/dL (ref 30.0–36.0)
MCV: 96.3 fL (ref 78.0–100.0)
Platelets: 95 10*3/uL — ABNORMAL LOW (ref 150–400)
RBC: 3.53 MIL/uL — ABNORMAL LOW (ref 3.87–5.11)
RDW: 13.8 % (ref 11.5–15.5)
WBC: 4.1 10*3/uL (ref 4.0–10.5)

## 2014-03-04 LAB — TROPONIN I: Troponin I: 0.03 ng/mL (ref <0.031)

## 2014-03-04 MED ORDER — CLONIDINE HCL 0.1 MG PO TABS
0.1000 mg | ORAL_TABLET | Freq: Once | ORAL | Status: AC
  Administered 2014-03-04: 0.1 mg via ORAL
  Filled 2014-03-04: qty 1

## 2014-03-04 MED ORDER — CETYLPYRIDINIUM CHLORIDE 0.05 % MT LIQD
7.0000 mL | Freq: Two times a day (BID) | OROMUCOSAL | Status: DC
  Administered 2014-03-04 – 2014-03-09 (×9): 7 mL via OROMUCOSAL

## 2014-03-04 NOTE — Progress Notes (Signed)
   Subjective:  Patient seen in consult on 12/30/14 for elevated troponin. This was felt to be demand ischemia related to sepsis. Cardiology signed off on 03/02/14, then was called yesterday for CP and nausea, troponins negative, ECG unchanged.  Today complains of HA. H  TELEMETRY: SR   Filed Vitals:   03/04/14 0200 03/04/14 0400 03/04/14 0600 03/04/14 0838  BP: 100/38 155/67 172/64   Pulse: 79 80 92   Temp:  98.5 F (36.9 C)    TempSrc:  Axillary    Resp: 14 12 18    Height:      Weight:   125 lb 7.1 oz (56.9 kg)   SpO2: 94% 94% 97% 98%    Intake/Output Summary (Last 24 hours) at 03/04/14 0904 Last data filed at 03/04/14 0600  Gross per 24 hour  Intake   2080 ml  Output    850 ml  Net   1230 ml   Filed Weights   02/28/14 1710 02/28/14 2151 03/04/14 0600  Weight: 120 lb (54.432 kg) 118 lb 2.7 oz (53.6 kg) 125 lb 7.1 oz (56.9 kg)    . antiseptic oral rinse  7 mL Mouth Rinse BID  . aspirin  325 mg Oral Daily  . calcium-vitamin D  1 tablet Oral BID WC  . chlorhexidine  15 mL Mouth Rinse BID  . feeding supplement (RESOURCE BREEZE)  1 Container Oral TID BM  . gabapentin  100 mg Oral BID  . ipratropium-albuterol  3 mL Nebulization TID  . metoprolol tartrate  50 mg Oral BID  . pantoprazole (PROTONIX) IV  40 mg Intravenous Q12H  . piperacillin-tazobactam (ZOSYN)  IV  3.375 g Intravenous Q8H   . sodium chloride Stopped (03/04/14 0600)     Recent Labs  03/03/14 0413 03/04/14 0350  WBC 3.7* 4.1  HGB 11.3* 11.0*  HCT 35.7* 34.0*  MCV 97.5 96.3  PLT 93* 95*   Cardiac Enzymes:  Recent Labs  03/03/14 1306 03/03/14 1946 03/04/14 0029  TROPONINI <0.03 <0.03 0.03    Ecg: sinus tachy with HR 124. Intermittent bursts of Afib with RVR.  PHYSICAL EXAM General: Elderly WF appears ill.  Head: Normocephalic, atraumatic, sclera non-icteric, oropharynx is clear Neck: Negative for carotid bruits. JVD not elevated. No adenopathy Lungs: diminished BS bilateral. Scattered  rhonchi. Rales left base. Heart: tachy, RRR S1 S2 without murmurs, rubs, or gallops.  Abdomen: Soft, non-tender, non-distended with normoactive bowel sounds. No hepatomegaly. No rebound/guarding. No obvious abdominal masses. Msk:  Strength and tone appears normal for age. Extremities: No clubbing, cyanosis or edema.  Distal pedal pulses are 2+ and equal bilaterally. Neuro: Alert and oriented X 3. Moves all extremities spontaneously. Psych:  Responds to questions appropriately with a normal affect.    ASSESSMENT AND PLAN:  1. Chest pain. Etiology is unclear. May be still demand ischemia in setting of multiple acute illnesses/infection. Negative troponin x 3 and unchanged ECG.  2. HTN - can further increase metoprolol to 75 mg PO BID. If still uncontrolled can add ACEI.   3. Paroxysmal atrial fibrillation. Due to multiple stressors. Good response to BB.   4. Sepsis with multiple infections. PNA, UTI, and colitis.   5. COPD  Present on Admission:  . Severe sepsis . Pneumonia . COPD with emphysema, gold stage C. . NSTEMI (non-ST elevated myocardial infarction) . Pancolitis . Essential hypertension  Signed, Dorothy Spark, [place] 03/04/2014 9:04 AM

## 2014-03-04 NOTE — Progress Notes (Signed)
ANTIBIOTIC CONSULT NOTE - Follow up  Pharmacy Consult for Zosyn Indication: Sepsis  Allergies  Allergen Reactions  . Codeine     REACTION: n/v/d, HA  . Morphine     REACTION: n/v/d, HA  . Sulfa Antibiotics     Sick    . Zoledronic Acid     REACTION: Severe edema    Patient Measurements: Height: 5' (152.4 cm) Weight: 125 lb 7.1 oz (56.9 kg) IBW/kg (Calculated) : 45.5  Vital Signs: Temp: 98.5 F (36.9 C) (02/06 0400) Temp Source: Axillary (02/06 0400) BP: 94/68 mmHg (02/06 1000) Pulse Rate: 93 (02/06 1000) Intake/Output from previous day: 02/05 0701 - 02/06 0700 In: 2300 [P.O.:1050; I.V.:1150; IV Piggyback:100] Out: 850 [Urine:850] Intake/Output from this shift: Total I/O In: 125 [I.V.:100; IV Piggyback:25] Out: -   Labs:  Recent Labs  03/02/14 0407 03/03/14 0413 03/04/14 0350  WBC 3.5* 3.7* 4.1  HGB 10.1* 11.3* 11.0*  PLT 83* 93* 95*   Estimated Creatinine Clearance: 39.9 mL/min (by C-G formula based on Cr of 0.6). No results for input(s): VANCOTROUGH, VANCOPEAK, VANCORANDOM, GENTTROUGH, GENTPEAK, GENTRANDOM, TOBRATROUGH, TOBRAPEAK, TOBRARND, AMIKACINPEAK, AMIKACINTROU, AMIKACIN in the last 72 hours.   Microbiology: Recent Results (from the past 720 hour(s))  Blood Culture (routine x 2)     Status: None (Preliminary result)   Collection Time: 02/28/14  5:25 PM  Result Value Ref Range Status   Specimen Description BLOOD RIGHT WRIST  Final   Special Requests BOTTLES DRAWN AEROBIC ONLY 3ML  Final   Culture   Final           BLOOD CULTURE RECEIVED NO GROWTH TO DATE CULTURE WILL BE HELD FOR 5 DAYS BEFORE ISSUING A FINAL NEGATIVE REPORT Performed at [facility]    Report Status PENDING  Incomplete  Blood Culture (routine x 2)     Status: None (Preliminary result)   Collection Time: 02/28/14  5:41 PM  Result Value Ref Range Status   Specimen Description BLOOD RAC  Final   Special Requests BOTTLES DRAWN AEROBIC ONLY 3ML  Final   Culture   Final          BLOOD CULTURE RECEIVED NO GROWTH TO DATE CULTURE WILL BE HELD FOR 5 DAYS BEFORE ISSUING A FINAL NEGATIVE REPORT Performed at [facility]    Report Status PENDING  Incomplete  Urine culture     Status: None   Collection Time: 02/28/14  5:49 PM  Result Value Ref Range Status   Specimen Description URINE, CATHETERIZED  Final   Special Requests NONE  Final   Colony Count   Final    >=100,000 COLONIES/ML Performed at [facility]    Culture   Final    KLEBSIELLA PNEUMONIAE Performed at [facility]    Report Status 03/03/2014 FINAL  Final   Organism ID, Bacteria KLEBSIELLA PNEUMONIAE  Final      Susceptibility   Klebsiella pneumoniae - MIC*    AMPICILLIN >=32 RESISTANT Resistant     CEFAZOLIN <=4 SENSITIVE Sensitive     CEFTRIAXONE <=1 SENSITIVE Sensitive     CIPROFLOXACIN <=0.25 SENSITIVE Sensitive     GENTAMICIN <=1 SENSITIVE Sensitive     LEVOFLOXACIN <=0.12 SENSITIVE Sensitive     NITROFURANTOIN 128 RESISTANT Resistant     TOBRAMYCIN <=1 SENSITIVE Sensitive     TRIMETH/SULFA <=20 SENSITIVE Sensitive     PIP/TAZO <=4 SENSITIVE Sensitive     * KLEBSIELLA PNEUMONIAE  MRSA PCR Screening     Status: None  Collection Time: 02/28/14 10:49 PM  Result Value Ref Range Status   MRSA by PCR NEGATIVE NEGATIVE Final    Comment:        The GeneXpert MRSA Assay (FDA approved for NASAL specimens only), is one component of a comprehensive MRSA colonization surveillance program. It is not intended to diagnose MRSA infection nor to guide or monitor treatment for MRSA infections.   Clostridium Difficile by PCR     Status: None   Collection Time: 03/03/14  8:24 AM  Result Value Ref Range Status   C difficile by pcr NEGATIVE NEGATIVE Final    Comment: Performed at [facility]   Assessment: 81yo F with weakness, abdominal pain, nausea/vomiting. In ED, had T 100.7 and HR as high as 153, RR as high as 34, WBC 17.7. Code Sepsis called. CT showed  possible cystitis/pyelo and patchy right-sided pna.   2/2 >> Vanc >> 2/5 2/2 >> Zosyn >>  Tmax: AF WBCs: wnl Renal: SCr wnl as of 2/3, CrCl 40CG; 57N using SCr = 0.8  2/2 blood x 2: NGTD 2/2 urine: >100k Klebsiella, resistant only to amp and nitro.  2/3 C Diff: IP ? 2/3 GI path panel: IP ? 2/3 stool Cx: no result 2/2 MRSA swab: negative 2/5 C Diff: neg  Goal of Therapy:  Appropriate antibiotic dosing for renal function; eradication of infection  Plan:  Cont on Zosyn 3.375g IV Q8H infused over 4hrs. Dose adjustments not likely to be needed so pharmacy will sign-off.  Romeo Rabon, PharmD, pager [phone]. 03/04/2014,1:30 PM.

## 2014-03-04 NOTE — Progress Notes (Signed)
BP elevated to 172/64 and pt no longer with IV access at this time. NP on call notified and new orders received. Will continue to monitor.

## 2014-03-04 NOTE — Progress Notes (Addendum)
Patient ID: [name] [name], female   DOB: Apr 23, 1925, 81 y.o.   MRN: [id number] [facility] PROGRESS NOTE  Rick Warnick YNW:[id number] DOB: [birth year] DOA: 02/28/2014 PCP: [name], NP  Brief narrative:    81 year-old female with past medical history hypertension, COPD who presented to [facility] ED with complaints of nausea and vomiting started 24 hours prior to this admission. Per family at the bedside, patient had fevers and chills. No reports of blood in the stool or melanotic stool. No reports of diarrhea.  On admission, patient was hypotensive, tachycardic, tachypneic, febrile with oxygen saturation of 93% on nasal cannula oxygen support. Her blood work was significant for elevated white blood cell count of 17.7, platelets 129. Initial troponin level was 0.31. Chest x-ray showed no acute cardiopulmonary process. CT abdomen revealed diffuse acute colitis, bladder wall thickening suspicious for cystitis, possible right side focal pyelonephritis, relatively diffuse patchy right-sided pneumonia. Patient was started on broad-spectrum antibiotics, vancomycin and Zosyn for treatment of multiple infections including pneumonia, UTI, colitis, possible sepsis. GI and cardiology on board.   Patient is improving very slowly. She remains in step down unit because of labile blood pressure. Her appetite has not improved significantly. Will see how patient does in the next 24-48 hours, we may get palliative care to be involved for goals of care.  Assessment/Plan:     Principal problem: Severe sepsis / septic shock secondary to multiple infections, pneumonia, Klebsiella urinary tract infection and colitis / leukocytosis - Sepsis criteria met on the admission with vitals that included hypotension even with IV fluid challenge. Patient was also tachycardic, increased work of breathing with tachypnea. Her oxygen saturation was in the low 90s but she was on a nasal cannula oxygen support. Blood work showed  elevated white blood cell count of 17.7 and elevated lactic acid at 4.85. Urinalysis revealed many bacteria however leukocytes and nitrites were not detected on urinalysis. CT abdomen suspicious for focal right-sided pyelonephritis, relatively diffuse patchy right-sided pneumonia and diffuse colitis. - Patient initially on broad-spectrum antibiotics, vancomycin and Zosyn. Vancomycin was stopped 03/03/2014. - Urine culture grew Klebsiella sensitive to Zosyn. Patient is currently on Zosyn only. Blood cultures to date are negative. - Patient is currently hypotensive, blood pressure 103/35 so we will keep in step down unit for next 24 hours.   Active Problems: Community acquired pneumonia - Patient on broad-spectrum antibiotics since the admission, vancomycin and Zosyn. There was evidence of pneumonia based on CT abdomen, patchy right-sided pneumonia. Since there is no evidence of MRSA or staph aureus infection we stopped vancomycin 03/03/2014. Patient is currently on Zosyn only. - Blood cultures today show no growth.  Klebsiella UTI / possible right-sided pyelonephritis - Possible right-sided pyelonephritis seen on CT abdomen. Urinalysis showed many bacteria but no leukocytes. Urine culture is growing Klebsiella. - Based on sensitivity report we are continuing Zosyn.  Diffuse colitis - Diffuse colitis seen on CT scan. Stool studies not stent because patient did not have diarrhea. - GI has seen the patient in consultation. No plans to do sigmoidoscopy at this time. - Patient did not have diarrhea only loose stool times once. C. difficile negative. Stool culture pending. - Current diet, clear liquids  Thrombocytopenia - Likely secondary to heparin drip she received on the admission. She was then on Lovenox for DVT prophylaxis. - Heparin stopped 03/01/2014, Lovenox stopped 03/02/2014.  - Continue SCDs for DVT prophylaxis. - Platelets remain stable. No bleeding.  Normocytic anemia - Initial  hemoglobin elevated at 17 which is  likely secondary to hemoconcentration. - Hemoglobin now 11. No indications for transfusion. No reports of bleeding.  Hypokalemia - Secondary to GI losses. Supplemented.  Acute COPD exacerbation / acute respiratory failure with hypoxia - Respiratory status remains stable.  - may use nebs as needed.  NSTEMI (non-ST elevated myocardial infarction) - Troponin elevation seen on the admission, reached a plateau at 0.3 andsubsequently trended down. This is likely secondary to demand ischemia from severe sepsis and septic shock. - Cardiology has seen the patient in consultation. Recommended stopping heparin drip. Patient was on heparin drip on the admission but this was stopped the following day. - Per cardiology, added metoprolol 50 mg by mouth twice a day. Continue aspirin daily.  DVT Prophylaxis  - SCDs bilaterally because of thrombocytopenia.   Code Status: Full.  Family Communication: plan of care discussed with the patient's daughter and granddaughter at the bedside  Disposition Plan: Patient with stay in SDU because of labile blood pressure. We may have palliative care involved for goals of care if patient does not improve in next 24-48 hours.  IV access:  Peripheral IV  Procedures and diagnostic studies:   Ct Abdomen Pelvis W Contrast 02/28/2014 1. Diffuse acute colitis noted, with partial sparing of the ascending colon, and surrounding soft tissue inflammation and trace fluid. This is either infectious or inflammatory in nature. 2. Bladder wall thickening is suspicious for cystitis, as it is new from the prior study. 3. Vaguely decreased enhancement at the anterior aspect of the right kidney is apparently new from the prior study, and may reflect mild focal pyelonephritis. Would correlate for associated symptoms. 4. Relatively diffuse patchy right-sided pneumonia noted. 5. Trace ascites within the abdomen and pelvis is thought to reflect the  colonic process. Mild edema about the small bowel and pancreas are thought to be reactive. This is most prominent about the first and second segments of the duodenum, of uncertain significance. 6. Pneumobilia likely reflects prior instrumentation at the duodenal ampulla. 7. Scattered small bilateral renal cysts; mild left renal scarring. 8. Scattered calcification along the abdominal aorta and its branches. 9. Right convex thoracolumbar scoliosis, with underlying mild degenerative change.   Dg Chest Port 1 View 22/2016 No acute cardiopulmonary process.   Medical Consultants:  Cardiology Gastroenterology  Other Consultants:  Physical therapy   IAnti-Infectives:   Vancomycin 03/01/2014 --> 03/03/2014  Zosyn [id number] -->    Leisa Lenz, MD  [facility] Pager [phone]  If 7PM-7AM, please contact night-coverage [email or web address] Password [email or web address] 03/04/2014, 9:17 AM   LOS: 4 days    HPI/Subjective: No acute overnight events.  Objective: Filed Vitals:   03/04/14 0200 03/04/14 0400 03/04/14 0600 03/04/14 0838  BP: 100/38 155/67 172/64   Pulse: 79 80 92   Temp:  98.5 F (36.9 C)    TempSrc:  Axillary    Resp: _0 Height:      Weight:   56.9 kg (125 lb 7.1 oz)   SpO2: 94% 94% 97% 98%    Intake/Output Summary (Last 24 hours) at 03/04/14 8309 Last data filed at 03/04/14 0600  Gross per 24 hour  Intake   2080 ml  Output    850 ml  Net   1230 ml    Exam:   General:  Pt is alert, appears ill, no distress  Cardiovascular: Regular rate and rhythm, S1/S2 appreciated  Respiratory: No wheezing, bilateral air and no wheezing   Abdomen: Nontender to palpation, pressure bowel sounds  Extremities: No edema,  pulses DP and PT palpable bilaterally  Neuro: No focal deficits, alert and awake  Data Reviewed: Basic Metabolic Panel:  Recent Labs Lab 02/28/14 1727 03/01/14 0304  NA 135 137  K 3.5 3.4*  CL 96 107  CO2 25 26  GLUCOSE 161* 117*  BUN  15 16  CREATININE 0.72 0.60  CALCIUM 8.2* 6.7*   Liver Function Tests:  Recent Labs Lab 02/28/14 1727  AST 41*  ALT 23  ALKPHOS 100  BILITOT 0.7  PROT 6.7  ALBUMIN 3.6    Recent Labs Lab 02/28/14 1727  LIPASE 18   No results for input(s): AMMONIA in the last 168 hours. CBC:  Recent Labs Lab 02/28/14 1727 03/01/14 0304 03/02/14 0407 03/03/14 0413 03/04/14 0350  WBC 17.7* 7.5 3.5* 3.7* 4.1  NEUTROABS 15.3*  --   --   --   --   HGB 17.9* 11.8* 10.1* 11.3* 11.0*  HCT 53.3* 36.6 31.8* 35.7* 34.0*  MCV 93.8 94.8 97.5 97.5 96.3  PLT 129* 92* 83* 93* 95*   Cardiac Enzymes:  Recent Labs Lab 03/01/14 0304 03/01/14 0854 03/03/14 1306 03/03/14 1946 03/04/14 0029  TROPONINI 0.16* 0.11* <0.03 <0.03 0.03   BNP: Invalid input(s): POCBNP CBG: No results for input(s): GLUCAP in the last 168 hours.  Blood Culture (routine x 2)     Status: None (Preliminary result)   Collection Time: 02/28/14  5:25 PM  Result Value Ref Range Status   Specimen Description BLOOD RIGHT WRIST  Final   Special Requests BOTTLES DRAWN AEROBIC ONLY 3ML  Final   Culture   Final           BLOOD CULTURE RECEIVED NO GROWTH TO DATE  Performed at [facility]    Report Status PENDING  Incomplete  Blood Culture (routine x 2)     Status: None (Preliminary result)   Collection Time: 02/28/14  5:41 PM  Result Value Ref Range Status   Specimen Description BLOOD RAC  Final   Special Requests BOTTLES DRAWN AEROBIC ONLY 3ML  Final   Culture   Final           BLOOD CULTURE RECEIVED NO GROWTH TO DATE    Report Status PENDING  Incomplete  Urine culture     Status: None   Collection Time: 02/28/14  5:49 PM  Result Value Ref Range Status   Specimen Description URINE, CATHETERIZED  Final   Special Requests NONE  Final   Colony Count   Final    >=100,000 COLONIES/ML Performed at [facility]    Culture   Final    KLEBSIELLA PNEUMONIAE Performed at [facility]    Report  Status 03/03/2014 FINAL  Final   Organism ID, Bacteria KLEBSIELLA PNEUMONIAE  Final      Susceptibility   Klebsiella pneumoniae - MIC*    AMPICILLIN >=32 RESISTANT Resistant     CEFAZOLIN <=4 SENSITIVE Sensitive     CEFTRIAXONE <=1 SENSITIVE Sensitive     CIPROFLOXACIN <=0.25 SENSITIVE Sensitive     GENTAMICIN <=1 SENSITIVE Sensitive     LEVOFLOXACIN <=0.12 SENSITIVE Sensitive     NITROFURANTOIN 128 RESISTANT Resistant     TOBRAMYCIN <=1 SENSITIVE Sensitive     TRIMETH/SULFA <=20 SENSITIVE Sensitive     PIP/TAZO <=4 SENSITIVE Sensitive     * KLEBSIELLA PNEUMONIAE  MRSA PCR Screening     Status: None   Collection Time: 02/28/14 10:49 PM  Result Value Ref Range Status   MRSA by PCR  NEGATIVE NEGATIVE Final  Clostridium Difficile by PCR     Status: None   Collection Time: 03/03/14  8:24 AM  Result Value Ref Range Status   C difficile by pcr NEGATIVE NEGATIVE Final    Comment: Performed at [facility]     Scheduled Meds: . aspirin  325 mg Oral Daily  . calcium-vitamin D  1 tablet Oral BID WC  . feeding supplement (RESOURCE BREEZE)  1 Container Oral TID BM  . gabapentin  100 mg Oral BID  . ipratropium-albuterol  3 mL Nebulization TID  . metoprolol tartrate  50 mg Oral BID  . pantoprazole (PROTONIX) IV  40 mg Intravenous Q12H  . piperacillin-tazobactam (ZOSYN)  IV  3.375 g Intravenous Q8H   Continuous Infusions: . sodium chloride Stopped (03/04/14 0600)

## 2014-03-04 NOTE — Progress Notes (Signed)
[facility] Progress Note  Subjective: Feels better than yesterday no nausea or abdominal pain has not noticed any bloody stools  Objective: Vital signs in last 24 hours: Temp:  [98.3 F (36.8 C)-98.7 F (37.1 C)] 98.5 F (36.9 C) (02/06 0400) Pulse Rate:  [63-126] 74 (02/06 0800) Resp:  [10-19] 10 (02/06 0800) BP: (100-192)/(35-87) 103/35 mmHg (02/06 0800) SpO2:  [94 %-100 %] 98 % (02/06 0838) Weight:  [56.9 kg (125 lb 7.1 oz)] 56.9 kg (125 lb 7.1 oz) (02/06 0600) Weight change:    PE: Abdomen soft nondistended with normoactive bowel sounds. no megaly masses or guarding  Lab Results: Results for orders placed or performed during the hospital encounter of 02/28/14 (from the past 24 hour(s))  Troponin I (q 6hr x 3)     Status: None   Collection Time: 03/03/14  1:06 PM  Result Value Ref Range   Troponin I <0.03 <0.031 ng/mL  Troponin I (q 6hr x 3)     Status: None   Collection Time: 03/03/14  7:46 PM  Result Value Ref Range   Troponin I <0.03 <0.031 ng/mL  Troponin I (q 6hr x 3)     Status: None   Collection Time: 03/04/14 12:29 AM  Result Value Ref Range   Troponin I 0.03 <0.031 ng/mL  CBC     Status: Abnormal   Collection Time: 03/04/14  3:50 AM  Result Value Ref Range   WBC 4.1 4.0 - 10.5 K/uL   RBC 3.53 (L) 3.87 - 5.11 MIL/uL   Hemoglobin 11.0 (L) 12.0 - 15.0 g/dL   HCT 34.0 (L) 36.0 - 46.0 %   MCV 96.3 78.0 - 100.0 fL   MCH 31.2 26.0 - 34.0 pg   MCHC 32.4 30.0 - 36.0 g/dL   RDW 13.8 11.5 - 15.5 %   Platelets 95 (L) 150 - 400 K/uL    Studies/Results: No results found.    Assessment: Acute colitis suspected infectious etiology. Stool studies pending  Plan: Continue antibiotics while awaiting stool cultures. We'll advance to full liquid diet today. We'll monitor stools and hemoglobin    [name] [name] C 03/04/2014, 9:52 AM

## 2014-03-05 DIAGNOSIS — K529 Noninfective gastroenteritis and colitis, unspecified: Secondary | ICD-10-CM

## 2014-03-05 LAB — CBC
HCT: 33 % — ABNORMAL LOW (ref 36.0–46.0)
Hemoglobin: 10.7 g/dL — ABNORMAL LOW (ref 12.0–15.0)
MCH: 31.3 pg (ref 26.0–34.0)
MCHC: 32.4 g/dL (ref 30.0–36.0)
MCV: 96.5 fL (ref 78.0–100.0)
Platelets: 123 10*3/uL — ABNORMAL LOW (ref 150–400)
RBC: 3.42 MIL/uL — ABNORMAL LOW (ref 3.87–5.11)
RDW: 13.7 % (ref 11.5–15.5)
WBC: 3.6 10*3/uL — ABNORMAL LOW (ref 4.0–10.5)

## 2014-03-05 LAB — PROCALCITONIN: Procalcitonin: <0.1 ng/mL

## 2014-03-05 NOTE — Progress Notes (Signed)
Patient ID: [name] [name], female   DOB: 1925-05-02, 81 y.o.   MRN: [id number] [facility] PROGRESS NOTE  [name] [name] XWS:[id number] DOB: Mar 06, 1925 DOA: 02/28/2014 PCP: [name] Fillers., NP  Brief narrative:    81 year-old female with past medical history hypertension, COPD who presented to [facility] ED with complaints of nausea and vomiting started 24 hours prior to this admission. Per family at the bedside, patient had fevers and chills. No reports of blood in the stool or melanotic stool. No reports of diarrhea.  On admission, patient was hypotensive, tachycardic, tachypneic, febrile with oxygen saturation of 93% on nasal cannula oxygen support. Her blood work was significant for elevated white blood cell count of 17.7, platelets 129. Initial troponin level was 0.31. Chest x-ray showed no acute cardiopulmonary process. CT abdomen revealed diffuse acute colitis, bladder wall thickening suspicious for cystitis, possible right side focal pyelonephritis, relatively diffuse patchy right-sided pneumonia. Patient was started on broad-spectrum antibiotics, vancomycin and Zosyn for treatment of multiple infections including pneumonia, UTI, colitis, possible sepsis. GI and cardiology on board.   Patient is improving slowly although better in past 24 hours, more alert and sitting in chair throughout the day yesterday.   Assessment/Plan:    Principal problem: Severe sepsis / septic shock secondary to multiple infections, pneumonia, Klebsiella urinary tract infection and colitis / leukocytosis - Sepsis criteria met on the admission considering pt had hypotension even with IV fluids. Patient was also tachycardic with increased work of breathing, tachypnea. Oxygen saturation was in the low 90s but while with nasal cannula oxygen support. Blood work showed elevated white blood cell count of 17.7 and elevated lactic acid at 4.85. Urinalysis revealed many bacteria but leukocytes and nitrites were not detected  on urinalysis. CT abdomen suspicious for focal right-sided pyelonephritis, relatively diffuse patchy right-sided pneumonia and diffuse colitis. - vanco and zosyn started on admission. vanco stopped 03/03/2014. - Urine culture grew Klebsiella sensitive to Zosyn. Blood cultures to date are negative. - will monitor for next 24 hours and likely transfer to telemetry floor 03/06/2014.   Active Problems: Community acquired pneumonia - Patient on vanco and zosyn since the admission, vancomycin and Zosyn. Diffuse patchy right sided pneumonia seen on CT abdomen. Vanco stopped 02/05/206. - Blood cultures to date show no growth.  Klebsiella UTI / possible right-sided pyelonephritis - Possible right-sided pyelonephritis seen on CT abdomen. Urinalysis showed many bacteria but no leukocytes.  - Urine culture grew Klebsiella. Continue zosyn for now.   Diffuse colitis - Diffuse colitis seen on CT scan. No abdominal pain this am. C.diff negative. - GI has seen the patient in consultation. No plans to do sigmoidoscopy at this time. - diet advanced to regular per patient request.   Thrombocytopenia - Likely secondary to heparin drip she received on the admission. She was then on Lovenox for DVT prophylaxis. - Heparin stopped 03/01/2014, Lovenox stopped 03/02/2014.  - platelet improving, 123 this am - no reports of bleeding   Normocytic anemia - Initial hemoglobin elevated at 17 which was likely secondary to hemoconcentration. - Hemoglobin now 11, 10.7. Stable.   Hypokalemia - Secondary to GI losses. Supplemented.  Acute COPD exacerbation / acute respiratory failure with hypoxia - Respiratory status stable.  - may use nebs as needed. - will try to wean oxygen if possible.   NSTEMI (non-ST elevated myocardial infarction) - Troponin elevation seen on the admission, reached a plateau at 0.3 and subsequently trended down. Likey secondary to demand ischemia from severe sepsis and septic shock. -  Cardiology has seen the patient in consultation. Recommended stopping heparin drip.  - Pt currently on metoprolol 25 mg PO BID. HR in 90's. Monitor BP closely since pt with very labile BP.  DVT Prophylaxis  - SCDs bilaterally because of thrombocytopenia.   Code Status: Full.  Family Communication: plan of care discussed with the patient's granddaughter [name] [name] at the bedside  Disposition Plan: Patient with stay in SDU for 24 hours, transfer totelemetry in am if hemodynamically stable.  IV access:  Peripheral IV  Procedures and diagnostic studies:   Ct Abdomen Pelvis W Contrast 02/28/2014 1. Diffuse acute colitis noted, with partial sparing of the ascending colon, and surrounding soft tissue inflammation and trace fluid. This is either infectious or inflammatory in nature. 2. Bladder wall thickening is suspicious for cystitis, as it is new from the prior study. 3. Vaguely decreased enhancement at the anterior aspect of the right kidney is apparently new from the prior study, and may reflect mild focal pyelonephritis. Would correlate for associated symptoms. 4. Relatively diffuse patchy right-sided pneumonia noted. 5. Trace ascites within the abdomen and pelvis is thought to reflect the colonic process. Mild edema about the small bowel and pancreas are thought to be reactive. This is most prominent about the first and second segments of the duodenum, of uncertain significance. 6. Pneumobilia likely reflects prior instrumentation at the duodenal ampulla. 7. Scattered small bilateral renal cysts; mild left renal scarring. 8. Scattered calcification along the abdominal aorta and its branches. 9. Right convex thoracolumbar scoliosis, with underlying mild degenerative change.   Dg Chest Port 1 View 22/2016 No acute cardiopulmonary process.   Medical Consultants:  [facility]iology [facility]  Other Consultants:  Physical therapy   IAnti-Infectives:   Vancomycin  03/01/2014 --> 03/03/2014  Zosyn [id number] -->   Leisa Lenz, MD  [facility] Pager [phone]  If 7PM-7AM, please contact night-coverage [email or web address] Password [facility] 03/05/2014, 10:36 AM   LOS: 5 days    HPI/Subjective: No acute overnight events.  Objective: Filed Vitals:   03/04/14 2155 03/04/14 2355 03/05/14 0600 03/05/14 0800  BP: 150/64 150/63 162/52 154/60  Pulse: 86 77 89 91  Temp:  98.6 F (37 C) 98.1 F (36.7 C)   TempSrc:  Oral Oral   Resp:  $Remo'15 13 12  'YZryI$ Height:      Weight:      SpO2:  99% 100% 100%    Intake/Output Summary (Last 24 hours) at 03/05/14 1036 Last data filed at 03/05/14 1000  Gross per 24 hour  Intake 1707.5 ml  Output   2350 ml  Net -642.5 ml    Exam:   General: Pt is not in distress, more alert this am  Cardiovascular: Regular rate and rhythm, S1/S2 (+)  Respiratory: clear to auscultation bilaterally, no crackles  Abdomen: (+) BS, non tender abdomen   Extremities: pulses palpable bilaterally, no edema  Neuro: no neurologic deficits   Data Reviewed: Basic Metabolic Panel:  Recent Labs Lab 02/28/14 1727 03/01/14 0304  NA 135 137  K 3.5 3.4*  CL 96 107  CO2 25 26  GLUCOSE 161* 117*  BUN 15 16  CREATININE 0.72 0.60  CALCIUM 8.2* 6.7*   Liver Function Tests:  Recent Labs Lab 02/28/14 1727  AST 41*  ALT 23  ALKPHOS 100  BILITOT 0.7  PROT 6.7  ALBUMIN 3.6    Recent Labs Lab 02/28/14 1727  LIPASE 18   No results for input(s): AMMONIA in the last 168 hours. CBC:  Recent Labs Lab 02/28/14  1727 03/01/14 0304 03/02/14 0407 03/03/14 0413 03/04/14 0350 03/05/14 0348  WBC 17.7* 7.5 3.5* 3.7* 4.1 3.6*  NEUTROABS 15.3*  --   --   --   --   --   HGB 17.9* 11.8* 10.1* 11.3* 11.0* 10.7*  HCT 53.3* 36.6 31.8* 35.7* 34.0* 33.0*  MCV 93.8 94.8 97.5 97.5 96.3 96.5  PLT 129* 92* 83* 93* 95* 123*   Cardiac Enzymes:  Recent Labs Lab 03/01/14 0304 03/01/14 0854 03/03/14 1306 03/03/14 1946  03/04/14 0029  TROPONINI 0.16* 0.11* <0.03 <0.03 0.03   BNP: Invalid input(s): POCBNP CBG: No results for input(s): GLUCAP in the last 168 hours.  Blood Culture (routine x 2)     Status: None (Preliminary result)   Collection Time: 02/28/14  5:25 PM  Result Value Ref Range Status   Specimen Description BLOOD RIGHT WRIST  Final   Special Requests BOTTLES DRAWN AEROBIC ONLY 3ML  Final   Culture   Final           BLOOD CULTURE RECEIVED NO GROWTH TO DATE     Report Status PENDING  Incomplete  Blood Culture (routine x 2)     Status: None (Preliminary result)   Collection Time: 02/28/14  5:41 PM  Result Value Ref Range Status   Specimen Description BLOOD RAC  Final   Special Requests BOTTLES DRAWN AEROBIC ONLY 3ML  Final   Culture   Final           BLOOD CULTURE RECEIVED NO GROWTH TO DATE     Report Status PENDING  Incomplete  Urine culture     Status: None   Collection Time: 02/28/14  5:49 PM  Result Value Ref Range Status   Specimen Description URINE, CATHETERIZED  Final   Special Requests NONE  Final   Colony Count   Final   Culture   Final    KLEBSIELLA PNEUMONIAE       Susceptibility   Klebsiella pneumoniae - MIC*    AMPICILLIN >=32 RESISTANT Resistant     CEFAZOLIN <=4 SENSITIVE Sensitive     CEFTRIAXONE <=1 SENSITIVE Sensitive     CIPROFLOXACIN <=0.25 SENSITIVE Sensitive     GENTAMICIN <=1 SENSITIVE Sensitive     LEVOFLOXACIN <=0.12 SENSITIVE Sensitive     NITROFURANTOIN 128 RESISTANT Resistant     TOBRAMYCIN <=1 SENSITIVE Sensitive     TRIMETH/SULFA <=20 SENSITIVE Sensitive     PIP/TAZO <=4 SENSITIVE Sensitive     * KLEBSIELLA PNEUMONIAE  MRSA PCR Screening     Status: None   Collection Time: 02/28/14 10:49 PM  Result Value Ref Range Status   MRSA by PCR NEGATIVE NEGATIVE Final  Clostridium Difficile by PCR     Status: None   Collection Time: 03/03/14  8:24 AM  Result Value Ref Range Status   C difficile by pcr NEGATIVE NEGATIVE Final    Comment: Performed  at [facility]     Scheduled Meds: . antiseptic oral rinse  7 mL Mouth Rinse BID  . aspirin  325 mg Oral Daily  . calcium-vitamin D  1 tablet Oral BID WC  . chlorhexidine  15 mL Mouth Rinse BID  . feeding supplement (RESOURCE BREEZE)  1 Container Oral TID BM  . gabapentin  100 mg Oral BID  . metoprolol tartrate  50 mg Oral BID  . pantoprazole (PROTONIX) IV  40 mg Intravenous Q12H  . piperacillin-tazobactam (ZOSYN)  IV  3.375 g Intravenous Q8H   Continuous Infusions: . sodium  chloride Stopped (03/04/14 0600)

## 2014-03-06 LAB — CBC
HCT: 33.3 % — ABNORMAL LOW (ref 36.0–46.0)
Hemoglobin: 10.4 g/dL — ABNORMAL LOW (ref 12.0–15.0)
MCH: 30.4 pg (ref 26.0–34.0)
MCHC: 31.2 g/dL (ref 30.0–36.0)
MCV: 97.4 fL (ref 78.0–100.0)
Platelets: 144 10*3/uL — ABNORMAL LOW (ref 150–400)
RBC: 3.42 MIL/uL — ABNORMAL LOW (ref 3.87–5.11)
RDW: 13.5 % (ref 11.5–15.5)
WBC: 3.9 10*3/uL — ABNORMAL LOW (ref 4.0–10.5)

## 2014-03-06 MED ORDER — PANTOPRAZOLE SODIUM 40 MG PO TBEC
40.0000 mg | DELAYED_RELEASE_TABLET | Freq: Two times a day (BID) | ORAL | Status: DC
Start: 2014-03-06 — End: 2014-03-07
  Administered 2014-03-06 – 2014-03-07 (×2): 40 mg via ORAL
  Filled 2014-03-06 (×3): qty 1

## 2014-03-06 MED ORDER — SACCHAROMYCES BOULARDII 250 MG PO CAPS
250.0000 mg | ORAL_CAPSULE | Freq: Two times a day (BID) | ORAL | Status: DC
  Administered 2014-03-06 – 2014-03-09 (×7): 250 mg via ORAL
  Filled 2014-03-06 (×9): qty 1

## 2014-03-06 NOTE — Progress Notes (Signed)
The patient is receiving Protonix by the intravenous route.  Based on criteria approved by the Pharmacy and [place], the medication is being converted to the equivalent oral dose form.  These criteria include: -No Active GI bleeding -Able to tolerate diet of full liquids (or better) or tube feeding -Able to tolerate other medications by the oral or enteral route  If you have any questions about this conversion, please contact the Pharmacy Department (phone [phone]).  Thank you.  Minda Ditto PharmD Pager [phone] 03/06/2014, 12:28 PM

## 2014-03-06 NOTE — Progress Notes (Signed)
Patient ID: [name] [name], female   DOB: Jul 24, 1925, 81 y.o.   MRN: [id number] [facility] PROGRESS NOTE  Markela Wee EZM:[id number] DOB: Dec 23, 1975 DOA: 02/28/2014 PCP: [name], NP  Brief narrative:    81 year-old female with past medical history hypertension, COPD who presented to [facility] ED with complaints of nausea and vomiting started 24 hours prior to this admission. Per family at the bedside, patient had fevers and chills. No reports of blood in the stool or melanotic stool. No reports of diarrhea.  On admission, patient was hypotensive, tachycardic, tachypneic, febrile with oxygen saturation of 93% on nasal cannula oxygen support. Her blood work was significant for elevated white blood cell count of 17.7, platelets 129. Initial troponin level was 0.31. Chest x-ray showed no acute cardiopulmonary process. CT abdomen revealed diffuse acute colitis, bladder wall thickening suspicious for cystitis, possible right side focal pyelonephritis, relatively diffuse patchy right-sided pneumonia. Patient was started on broad-spectrum antibiotics, vancomycin and Zosyn for treatment of multiple infections including pneumonia, UTI, colitis, possible sepsis. GI and cardiology on board.   Patient seems to be feeling better in past 24 hours. She is eating little bit better. She will be transferred to telemetry floor today with anticipation to work with physical therapy and hopefully discharge in next few days.   Assessment/Plan:     Principal problem: Severe sepsis / septic shock secondary to multiple infections, pneumonia, Klebsiella urinary tract infection and colitis / leukocytosis - Sepsis criteria met on the admission considering pt had hypotension even when given IV fluids. In addition she was tachycardic and had increased work of breathing. Her oxygen saturation was in the low 90s but this is with nasal cannula oxygen support. Blood work showed leukocytosis of 17.7, elevated lactic  acid at 4.84.  Urinalysis revealed many bacteria but leukocytes and nitrites were not detected on urinalysis. CT abdomen suspicious for focal right-sided pyelonephritis, relatively diffuse patchy right-sided pneumonia and diffuse colitis. - Patient was on vancomycin and Zosyn since the admission. We stopped vancomycin 03/03/2014. - Blood cultures to date are negative. Urine culture significant for Klebsiella pneumonia. Based on sensitivity report is sensitive to Zosyn which she is currently taking. - Patient is hemodynamically stable. She is stable for transfer to telemetry floor today. - Anticipate working with physical therapy while on telemetry floor.  Active Problems: Community acquired pneumonia - Patient was on broad-spectrum antibiotics since the admission. Vancomycin was stopped 03/03/2014. Presently she is only on Zosyn. - Blood cultures to date show no growth.  Klebsiella UTI / possible right-sided pyelonephritis - Possible right-sided pyelonephritis seen on CT abdomen. Urinalysis on admission showed many bacteria but no leukocytes. Urine culture growing Klebsiella. Sensitive to Zosyn. Continue Zosyn.   Diffuse colitis - Diffuse colitis seen on CT scan. No abdominal pain for past few days. Stool for C. difficile sent and it was negative. - Appreciate GI seeing the patient. No plan for sigmoidoscopy this time. Today will sign off today 03/06/2014. - Diet regular and patient tolerates it. - Continue Protonix and per GI recommendations we added probiotic.  Thrombocytopenia - Likely secondary to heparin drip she received on the admission. She was then on Lovenox for DVT prophylaxis. - Heparin stopped 03/01/2014, Lovenox stopped 03/02/2014.  - Platelet count is much better since the admission. It is 144 this morning.  Normocytic anemia - Initial hemoglobin elevated at 17 which was likely secondary to hemoconcentration. - Hemoglobin now 10.4. Stable with no reports of bleeding. No  indications for transfusion.  Hypokalemia -  Secondary to GI losses. Supplemented.  Acute COPD exacerbation / acute respiratory failure with hypoxia - Respiratory status stable.   NSTEMI (non-ST elevated myocardial infarction) - Troponin elevation seen on the admission, reached a plateau at 0.3 and subsequently trended down. Likey secondary to demand ischemia from severe sepsis and septic shock. - Cardiology has seen the patient in consultation. Recommended stopping heparin drip. She was on heparin drip only for couple of hours on the admission. - Pt currently on metoprolol 25 mg PO BID. HR in 90's.  - Patient will continue to be on telemetry for at least 24 hours once she is transferred out of stepdown unit.  DVT Prophylaxis  - SCDs bilaterally because of thrombocytopenia.   Code Status: Full.  Family Communication: plan of care discussed with the patient's granddaughter [name] at the bedside  Disposition Plan: Transfer to telemetry floor today. Order placed.  IV access:  Peripheral IV  Procedures and diagnostic studies:   Ct Abdomen Pelvis W Contrast 02/28/2014 1. Diffuse acute colitis noted, with partial sparing of the ascending colon, and surrounding soft tissue inflammation and trace fluid. This is either infectious or inflammatory in nature. 2. Bladder wall thickening is suspicious for cystitis, as it is new from the prior study. 3. Vaguely decreased enhancement at the anterior aspect of the right kidney is apparently new from the prior study, and may reflect mild focal pyelonephritis. Would correlate for associated symptoms. 4. Relatively diffuse patchy right-sided pneumonia noted. 5. Trace ascites within the abdomen and pelvis is thought to reflect the colonic process. Mild edema about the small bowel and pancreas are thought to be reactive. This is most prominent about the first and second segments of the duodenum, of uncertain significance. 6. Pneumobilia likely  reflects prior instrumentation at the duodenal ampulla. 7. Scattered small bilateral renal cysts; mild left renal scarring. 8. Scattered calcification along the abdominal aorta and its branches. 9. Right convex thoracolumbar scoliosis, with underlying mild degenerative change.   Dg Chest Port 1 View 22/2016 No acute cardiopulmonary process.   Medical Consultants:  Cardiology Gastroenterology  Other Consultants:  Physical therapy   IAnti-Infectives:   Vancomycin 03/01/2014 --> 03/03/2014  Zosyn 02/032016 -->     Leisa Lenz, MD  [facility] Pager [phone]  If 7PM-7AM, please contact night-coverage [email or web address] Password [email or web address] 03/06/2014, 10:21 AM   LOS: 6 days    HPI/Subjective: No acute overnight events.  Objective: Filed Vitals:   03/06/14 0000 03/06/14 0253 03/06/14 0400 03/06/14 0800  BP:  116/62    Pulse:  71 66   Temp: 98.5 F (36.9 C)  98.1 F (36.7 C) 98 F (36.7 C)  TempSrc: Oral  Axillary Oral  Resp:  11 9   Height:      Weight:      SpO2:  100% 100%     Intake/Output Summary (Last 24 hours) at 03/06/14 1021 Last data filed at 03/06/14 0900  Gross per 24 hour  Intake   1770 ml  Output   1950 ml  Net   -180 ml    Exam:   General:  Pt is alert, sitting in chair, comfortable  Cardiovascular: Regular rate and rhythm, S1/S2 appreciated  Respiratory: Clear to auscultation bilaterally, no wheezing, no crackles, no rhonchi  Abdomen: Abdomen nontender, no distention, appreciate bowel sounds  Extremities: No edema, pulses DP and PT palpable bilaterally  Neuro: No focal deficits, good strength bilaterally  Data Reviewed: Basic Metabolic Panel:  Recent Labs Lab 02/28/14 1727 03/01/14 0304  NA 135 137  K 3.5 3.4*  CL 96 107  CO2 25 26  GLUCOSE 161* 117*  BUN 15 16  CREATININE 0.72 0.60  CALCIUM 8.2* 6.7*   Liver Function Tests:  Recent Labs Lab 02/28/14 1727  AST 41*  ALT 23  ALKPHOS 100  BILITOT  0.7  PROT 6.7  ALBUMIN 3.6    Recent Labs Lab 02/28/14 1727  LIPASE 18   No results for input(s): AMMONIA in the last 168 hours. CBC:  Recent Labs Lab 02/28/14 1727  03/02/14 0407 03/03/14 0413 03/04/14 0350 03/05/14 0348 03/06/14 0417  WBC 17.7*  < > 3.5* 3.7* 4.1 3.6* 3.9*  NEUTROABS 15.3*  --   --   --   --   --   --   HGB 17.9*  < > 10.1* 11.3* 11.0* 10.7* 10.4*  HCT 53.3*  < > 31.8* 35.7* 34.0* 33.0* 33.3*  MCV 93.8  < > 97.5 97.5 96.3 96.5 97.4  PLT 129*  < > 83* 93* 95* 123* 144*  < > = values in this interval not displayed. Cardiac Enzymes:  Recent Labs Lab 03/01/14 0304 03/01/14 0854 03/03/14 1306 03/03/14 1946 03/04/14 0029  TROPONINI 0.16* 0.11* <0.03 <0.03 0.03   BNP: Invalid input(s): POCBNP CBG: No results for input(s): GLUCAP in the last 168 hours.  Recent Results (from the past 240 hour(s))  Blood Culture (routine x 2)     Status: None (Preliminary result)   Collection Time: 02/28/14  5:25 PM  Result Value Ref Range Status   Specimen Description BLOOD RIGHT WRIST  Final   Special Requests BOTTLES DRAWN AEROBIC ONLY 3ML  Final   Culture   Final           BLOOD CULTURE RECEIVED NO GROWTH TO DATE CULTURE WILL BE HELD FOR 5 DAYS BEFORE ISSUING A FINAL NEGATIVE REPORT Performed at [facility]    Report Status PENDING  Incomplete  Blood Culture (routine x 2)     Status: None (Preliminary result)   Collection Time: 02/28/14  5:41 PM  Result Value Ref Range Status   Specimen Description BLOOD RAC  Final   Special Requests BOTTLES DRAWN AEROBIC ONLY 3ML  Final   Culture   Final           BLOOD CULTURE RECEIVED NO GROWTH TO DATE CULTURE WILL BE HELD FOR 5 DAYS BEFORE ISSUING A FINAL NEGATIVE REPORT Performed at [facility]    Report Status PENDING  Incomplete  Urine culture     Status: None   Collection Time: 02/28/14  5:49 PM  Result Value Ref Range Status   Specimen Description URINE, CATHETERIZED  Final   Special Requests  NONE  Final   Colony Count   Final    >=100,000 COLONIES/ML Performed at [facility]    Culture   Final    KLEBSIELLA PNEUMONIAE Performed at [facility]    Report Status 03/03/2014 FINAL  Final   Organism ID, Bacteria KLEBSIELLA PNEUMONIAE  Final      Susceptibility   Klebsiella pneumoniae - MIC*    AMPICILLIN >=32 RESISTANT Resistant     CEFAZOLIN <=4 SENSITIVE Sensitive     CEFTRIAXONE <=1 SENSITIVE Sensitive     CIPROFLOXACIN <=0.25 SENSITIVE Sensitive     GENTAMICIN <=1 SENSITIVE Sensitive     LEVOFLOXACIN <=0.12 SENSITIVE Sensitive     NITROFURANTOIN 128 RESISTANT Resistant     TOBRAMYCIN <=1 SENSITIVE Sensitive     TRIMETH/SULFA <=  20 SENSITIVE Sensitive     PIP/TAZO <=4 SENSITIVE Sensitive     * KLEBSIELLA PNEUMONIAE  MRSA PCR Screening     Status: None   Collection Time: 02/28/14 10:49 PM  Result Value Ref Range Status   MRSA by PCR NEGATIVE NEGATIVE Final    Comment:        The GeneXpert MRSA Assay (FDA approved for NASAL specimens only), is one component of a comprehensive MRSA colonization surveillance program. It is not intended to diagnose MRSA infection nor to guide or monitor treatment for MRSA infections.   Clostridium Difficile by PCR     Status: None   Collection Time: 03/03/14  8:24 AM  Result Value Ref Range Status   C difficile by pcr NEGATIVE NEGATIVE Final    Comment: Performed at [facility]  Stool culture     Status: None (Preliminary result)   Collection Time: 03/03/14  9:52 PM  Result Value Ref Range Status   Specimen Description STOOL  Final   Special Requests NONE  Final   Culture   Final    ABUNDANT CANDIDA ALBICANS NO SUSPICIOUS COLONIES, CONTINUING TO HOLD Performed at [facility]    Report Status PENDING  Incomplete     Scheduled Meds: . antiseptic oral rinse  7 mL Mouth Rinse BID  . aspirin  325 mg Oral Daily  . calcium-vitamin D  1 tablet Oral BID WC  . chlorhexidine  15 mL Mouth Rinse  BID  . feeding supplement (RESOURCE BREEZE)  1 Container Oral TID BM  . gabapentin  100 mg Oral BID  . metoprolol tartrate  50 mg Oral BID  . pantoprazole (PROTONIX) IV  40 mg Intravenous Q12H  . piperacillin-tazobactam (ZOSYN)  IV  3.375 g Intravenous Q8H  . saccharomyces boulardii  250 mg Oral BID   Continuous Infusions: . sodium chloride 50 mL/hr (03/05/14 1533)

## 2014-03-06 NOTE — Evaluation (Signed)
Physical Therapy Evaluation Patient Details Name: [name] [name] MRN: [id number] DOB: 28-Aug-1925 Today's Date: 03/06/2014   History of Present Illness  81 year-old female with past medical history hypertension, COPD who presented to [facility] ED with complaints of nausea and vomiting and admitted for Severe sepsis, septic shock secondary to multiple infections, pneumonia, Klebsiella urinary tract infection and colitis  Clinical Impression  Pt admitted with above diagnosis. Pt currently with functional limitations due to the deficits listed below (see PT Problem List).  Pt will benefit from skilled PT to increase their independence and safety with mobility to allow discharge to the venue listed below.  Pt reports living alone.  Pt only able to tolerate short distance ambulation today and may require O2 [place] with mobility (see below).  Recommend supervision for mobility at this time upon d/c so pt may need ST-SNF if assist not available at home.     Follow Up Recommendations SNF;Supervision for mobility/OOB    Equipment Recommendations  None recommended by PT    Recommendations for Other Services       Precautions / Restrictions Precautions Precautions: Fall Precaution Comments: monitor sats      Mobility  Bed Mobility Overal bed mobility: Needs Assistance Bed Mobility: Supine to Sit     Supine to sit: Supervision;HOB elevated     General bed mobility comments: increased time, dizziness reported upon sitting however resolved quickly  Transfers Overall transfer level: Needs assistance   Transfers: Sit to/from Stand Sit to Stand: Min assist         General transfer comment: verbal cues for safe technique, assist to steady  Ambulation/Gait Ambulation/Gait assistance: Min assist Ambulation Distance (Feet): 25 Feet Assistive device: Rolling walker (2 wheeled) Gait Pattern/deviations: Step-through pattern;Decreased stride length;Trunk flexed     General Gait Details: slow pace,  ambulated without oxygen as pt reports typically only wears at night however SpO2 dropped to 75% upon return to sitting and 1L O2  quickly reapplied with SpO2 improving to 98% with breathing and rest  Stairs            Wheelchair Mobility    Modified Rankin (Stroke Patients Only)       Balance                                             Pertinent Vitals/Pain Pain Assessment: No/denies pain    Home Living Family/patient expects to be discharged to:: Private residence Living Arrangements: Alone   Type of Home: House Home Access: Ramped entrance     Home Layout: One level Home Equipment: Environmental consultant - 2 wheels;Cane - single point;Bedside commode Additional Comments: O2 at night    Prior Function Level of Independence: Independent with assistive device(s)         Comments: reports occasionally using RW     Hand Dominance        Extremity/Trunk Assessment               Lower Extremity Assessment: Generalized weakness         Communication   Communication: No difficulties  Cognition Arousal/Alertness: Awake/alert Behavior During Therapy: WFL for tasks assessed/performed Overall Cognitive Status: Within Functional Limits for tasks assessed                      General Comments      Exercises  Assessment/Plan    PT Assessment Patient needs continued PT services  PT Diagnosis Difficulty walking   PT Problem List Decreased strength;Decreased activity tolerance;Decreased mobility  PT Treatment Interventions DME instruction;Gait training;Functional mobility training;Patient/family education;Therapeutic activities;Therapeutic exercise   PT Goals (Current goals can be found in the Care Plan section) Acute Rehab PT Goals PT Goal Formulation: With patient Time For Goal Achievement: 03/13/14 Potential to Achieve Goals: Good    Frequency Min 3X/week   Barriers to discharge        Co-evaluation                End of Session Equipment Utilized During Treatment: Oxygen;Gait belt Activity Tolerance: Patient limited by fatigue Patient left: with nursing/sitter in room (wheelchair with nursing to move to floor from ICU)           Time: 7915-0569 PT Time Calculation (min) (ACUTE ONLY): 12 min   Charges:   PT Evaluation $Initial PT Evaluation Tier I: 1 Procedure     PT G Codes:        Michale Emmerich,KATHrine E 03/06/2014, 1:16 PM Carmelia Bake, PT, DPT 03/06/2014 Pager: [phone]

## 2014-03-06 NOTE — Progress Notes (Signed)
Clinical Social Work Department BRIEF PSYCHOSOCIAL ASSESSMENT 03/06/2014  Patient:  [name] [name], [name] [name]     Account Number:  [id number]     Admit date:  02/28/2014  Clinical Social Worker:  Earlie Server  Date/Time:  03/06/2014 04:15 PM  Referred by:  Physician  Date Referred:  03/06/2014 Referred for  SNF Placement   Other Referral:   Interview type:  Patient Other interview type:    PSYCHOSOCIAL DATA Living Status:  ALONE Admitted from facility:   Level of care:   Primary support name:  [name] Primary support relationship to patient:  CHILD, ADULT Degree of support available:   Strong    CURRENT CONCERNS Current Concerns  Post-Acute Placement   Other Concerns:    SOCIAL WORK ASSESSMENT / PLAN CSW received referral to assist with DC planning. CSW reviewed chart and met with patient and dtr at bedside. CSW introduced myself and explained role.    Patient reports she lives at home alone. Patient reports she was independent prior to admission and was even mowing her own lawn. Patient has two sons and a dtr that live nearby her and assist her as needed. Dtr confirmed information and reports she had previously asked about [facility] services because she knew that patient was going to be deconditioned. CSW provided SNF list and explained process along with PT recommendations. Dtr reports she was not in the room when PT worked with patient so she does not want to assume that patient needs SNF. CSW brought up PT note and explained that patient required assistance. Patient reports she prefers to return home. Dtr reports she is fine with patient returning home but wants to see how patient works with PT tomorrow. CSW offered to complete SNF search to have SNF as an option in case patient is too weak to return home. Dtr requested to speak with her family tonight and for CSW to follow up tomorrow.    CSW completed FL2 in case SNF is needed. CSW will follow up tomorrow.   Assessment/plan status:   Psychosocial Support/Ongoing Assessment of Needs Other assessment/ plan:   Information/referral to community resources:   SNF information    PATIENT'S/FAMILY'S RESPONSE TO PLAN OF CARE: Patient alert and engaged during assessment. Patient's dtr reports that family is very involved and understands the difference between [facility] and SNF. Dtr reports that she does not mind providing additional care for patient but needs to know how much her siblings can assist as well. Patient is independent and worried if she goes to a SNF then she will not be able to return home. CSW explained SNF and that patient could stay for ST and return home once strong again. Patient reports she understands options and agreeable for CSW to follow up tomorrow.       [place], [place] [phone]

## 2014-03-06 NOTE — Progress Notes (Addendum)
Clinical Social Work Department CLINICAL SOCIAL WORK PLACEMENT NOTE 03/06/2014  Patient:  [name] [name], [name] [name]  Account Number:  [id number] Admit date:  02/28/2014  Clinical Social Worker:  Sindy Messing, LCSW  Date/time:  03/06/2014 04:30 PM  Clinical Social Work is seeking post-discharge placement for this patient at the following level of care:   SKILLED NURSING   (*CSW will update this form in Epic as items are completed)   03/06/2014  Patient/family provided with [place] Department of Clinical Social Work's list of facilities offering this level of care within the geographic area requested by the patient (or if unable, by the patient's family).  03/06/2014  Patient/family informed of their freedom to choose among providers that offer the needed level of care, that participate in Medicare, Medicaid or managed care program needed by the patient, have an available bed and are willing to accept the patient.  03/06/2014  Patient/family informed of [facility]' ownership interest in [facility], as well as of the fact that they are under no obligation to receive care at this facility.  PASARR submitted to EDS on 03/06/2014 PASARR number received on 03/06/2014  FL2 transmitted to all facilities in geographic area requested by pt/family on 03/07/2014  FL2 transmitted to all facilities within larger geographic area on   Patient informed that his/her managed care company has contracts with or will negotiate with  certain facilities, including the following:     Patient/family informed of bed offers received:  03/08/14 Patient chooses bed at [facility] Physician recommends and patient chooses bed at    Patient to be transferred to  on  [facility] on 03/09/2014 Patient to be transferred to facility by ambulance Corey Harold) Patient and family notified of transfer on 03/09/2014 Name of family member notified:  Pt notified at bedside. Pt daughter, [name] notified via telephone.  The  following physician request were entered in Epic:   Additional Comments:   Alison Murray, MSW, LCSW Clinical Social Work Coverage for [place], [place]

## 2014-03-06 NOTE — Progress Notes (Signed)
CARE MANAGEMENT NOTE 03/06/2014  Patient:  [name] [name], [name] [name]   Account Number:  [id number]  Date Initiated:  03/01/2014  Documentation initiated by:  DAVIS,RHONDA  Subjective/Objective Assessment:   sepsis     Action/Plan:   home when stable   Anticipated DC Date:  03/09/2014   Anticipated DC Plan:  HOME/SELF CARE  In-house referral  NA      DC Planning Services  NA      [facility] Choice  NA   Choice offered to / List presented to:  NA   DME arranged  NA      DME agency  NA     [place] arranged  NA      [place] agency  NA   Status of service:  In process, will continue to follow Medicare Important Message given?   (If response is "NO", the following Medicare IM given date fields will be blank) Date Medicare IM given:   Medicare IM given by:   Date Additional Medicare IM given:   Additional Medicare IM given by:    Discharge Disposition:    Per UR Regulation:  Reviewed for med. necessity/level of care/duration of stay  If discussed at [place] of Stay Meetings, dates discussed:    Comments:  02/08/20216/Rhonda L. Rosana Hoes, RN, BSN, CCM: Chart review for medical necessity and patient discharge needs. Case Manager will follow for patient condition changes. [id number]/VVOHYW L. Rosana Hoes, RN, BSN, CCM: CHART NOTE FOR PROGRESSION: Acute colitis suspected infectious etiology. Stool studies pending Plan: Continue antibiotics while awaiting stool cultures. We'll advance to full liquid diet today. We'll monitor stools and hemoglobin    [id number]/Rhonda Rosana Hoes, RN, BSN, [place]: [phone] Case management. Chart reviewed for discharge planning and present needs. Discharge needs: none present at time of review. Next chart review due:  [id number]

## 2014-03-06 NOTE — Progress Notes (Signed)
The patient is free of abdominal pain. She is having some loose stools.  Review of her office record indicates that she has had 2 or 3 similar episodes of colitis like this, characterized by diffuse colonic mural thickening, apparently without a specific etiology ever being identified.   As far as I can tell, she has not had full colonoscopy in the past, certainly not in recent years.   Her CT from last October did not show colitis at that time, so this is not a persistent finding; note was made on that exam, that there were no obvious atherosclerotic obstructions of the aortic branches.  At this time, the patient is afebrile, her white count is low normal as it has been for several days, she is in no distress, and the abdomen is nontender. She is on a regular diet, but not eating much.  I note that the patient is on probiotic prophylaxis, while on antibiotic therapy with Zosyn for multiple infections including pneumonia.  At this time, I will plan to sign off. I do not feel that doing colonoscopy would necessarily yield a specific diagnosis, and even if it did show something specific, such as resolving ischemic colitis, I don't see where that would lead to a change in management going forward. I explained this to the patient's granddaughter at the bedside, and she is agreeable. I do not think that further antibiotic therapy is needed from the colitis perspective, so the duration of her antibiotics would be dependent on how long treatment is needed for her other conditions, such as her pneumonia.  Please call if we can be of further assistance with this patient.  Cleotis Nipper, M.D. [phone]

## 2014-03-06 NOTE — Progress Notes (Signed)
Received report from [facility]. Pt arrived unit from ICU. Alert and oriented, MD notified of Pt's location. will continue with current plan of care.

## 2014-03-07 DIAGNOSIS — K51918 Ulcerative colitis, unspecified with other complication: Secondary | ICD-10-CM

## 2014-03-07 LAB — CBC
HCT: 34.7 % — ABNORMAL LOW (ref 36.0–46.0)
Hemoglobin: 10.8 g/dL — ABNORMAL LOW (ref 12.0–15.0)
MCH: 30.3 pg (ref 26.0–34.0)
MCHC: 31.1 g/dL (ref 30.0–36.0)
MCV: 97.2 fL (ref 78.0–100.0)
Platelets: 175 10*3/uL (ref 150–400)
RBC: 3.57 MIL/uL — ABNORMAL LOW (ref 3.87–5.11)
RDW: 13.3 % (ref 11.5–15.5)
WBC: 4 10*3/uL (ref 4.0–10.5)

## 2014-03-07 LAB — CULTURE, BLOOD (ROUTINE X 2)
Culture: NO GROWTH
Culture: NO GROWTH

## 2014-03-07 LAB — STOOL CULTURE

## 2014-03-07 MED ORDER — PANTOPRAZOLE SODIUM 40 MG PO TBEC
40.0000 mg | DELAYED_RELEASE_TABLET | Freq: Every day | ORAL | Status: DC
  Administered 2014-03-08 – 2014-03-09 (×2): 40 mg via ORAL
  Filled 2014-03-07 (×2): qty 1

## 2014-03-07 NOTE — Progress Notes (Signed)
Clinical Social Work  CSW met with patient and dtr [name]) at bedside in order to discuss DC plans. Patient was unable to work with PT today and dtr reports she does not want to make a final decision until PT is able to provide further information. CSW reminded patient and family that PT had evaluated patient and recommends SNF placement. Patient reports if she had to go to a facility she has heard good recommendations for [facility]. CSW offered to complete [facility] search in order to have all options available. Patient reports she is very particular and has not made a final decision. Patient agreeable for CSW to check with [facility] for availability but does not want [facility]. Dtr reported that CSW could follow up tomorrow after PT has evaluated patient but they have not made any final decisions at this time.  CSW will continue to follow.  [place], [place] [phone]

## 2014-03-07 NOTE — Progress Notes (Signed)
Patient ID: Valoree Agent, female   DOB: [birth year], 81 y.o.   MRN: [id number] [facility] PROGRESS NOTE  Victorious Cosio LYH:[id number] DOB: 1925/03/31 DOA: 02/28/2014 PCP: [name], NP  Brief narrative:    81 year-old female with past medical history hypertension, COPD who presented to [facility] ED with complaints of nausea and vomiting started 24 hours prior to this admission. Per family at the bedside, patient had fevers and chills. No reports of blood in the stool or melanotic stool. No reports of diarrhea.  On admission, patient was hypotensive, tachycardic, tachypneic, febrile with oxygen saturation of 93% on nasal cannula oxygen support. Her blood work was significant for elevated white blood cell count of 17.7, platelets 129. Initial troponin level was 0.31. Patient was started on heparin drip on the admission. She was subsequently seen by cardiology who recommended stopping heparin drip because troponin level likely elevated from demand ischemia because of sepsis.  Chest x-ray showed no acute cardiopulmonary process. CT abdomen revealed diffuse acute colitis, bladder wall thickening suspicious for cystitis, possible right side focal pyelonephritis, relatively diffuse patchy right-sided pneumonia. Patient was started on broad-spectrum antibiotics, vancomycin and Zosyn for treatment of multiple infections including pneumonia, UTI and sepsis. Patient has been seen by GI in consultation. GI did not recommend endoscopy or sigmoidoscopy because patient started to improve and tolerating by mouth intake.   Assessment/Plan:    Principal problem: Severe sepsis / septic shock secondary to multiple infections, pneumonia, Klebsiella urinary tract infection and colitis / leukocytosis - Sepsis criteria met on the admission considering pt had hypotension even when given IV fluids. In addition she was tachycardic and had increased work of breathing. Her oxygen saturation was in the low 90s but this  is with nasal cannula oxygen support. Blood work showed leukocytosis of 17.7, elevated lactic acid at 4.84. Urinalysis revealed many bacteria but leukocytes and nitrites were not detected on urinalysis. CT abdomen suspicious for focal right-sided pyelonephritis, relatively diffuse patchy right-sided pneumonia and diffuse colitis. - Patient was started on broad-spectrum antibiotics on the admission, vancomycin and Zosyn. Since no evidence of staph aureus or MRSA infection vancomycin was stopped 03/03/2014. - Blood cultures obtained on the admission and so far show no growth. Urine culture is growing Klebsiella pneumoniae sensitive to Zosyn. Patient is currently on Zosyn only. - Patient was transferred out of the stepdown unit 03/06/2014. She remains hemodynamically stable. - We anticipate that she will be working with physical therapy today. We'll follow up on recommendations. Per patient's granddaughter [name] she doesn't think that patient will want to go to skilled nursing facility and would prefer that she rather goes home when she is stable for discharge.   Active Problems: Community acquired pneumonia - CT scan on the admission showed relatively diffuse patchy right-sided pneumonia. Patient was started on broad-spectrum antibiotics, vancomycin and Zosyn. As noted above vancomycin was stopped 03/03/2014 and patient is currently on Zosyn. - Respiratory status remains stable.  Klebsiella UTI / possible right-sided pyelonephritis - CT scan on the admission showed possible right-sided pyelonephritis. Urinalysis on the admission showed many bacteria. Urine culture is growing Klebsiella. - She is currently on Zosyn, day #6. We'll continue for 1 more day and then we will stop. This should complete 7 days treatment for pyelonephritis.  Diffuse colitis - Diffuse colitis seen on CT scan. Stool for C. difficile sent and it was negative. No episodes of diarrhea prior to the admission or since the  admission. - GI has seen the patient in consultation. No  further workup indicated at this time and they have signed off 03/06/2014. - We added probiotic. Patient is on Protonix as well for severe GERD per family.  - Diet regular  Thrombocytopenia - Likely secondary to heparin drip she received on the admission. She was then on Lovenox for DVT prophylaxis. - Heparin stopped 03/01/2014, Lovenox stopped 03/02/2014.  - Platelet count is now WNL.  Normocytic anemia - Initial hemoglobin elevated at 17 which was likely secondary to hemoconcentration. - Hemoglobin stale at 10.8.  Hypokalemia - Secondary to GI losses. Supplemented.  Acute COPD exacerbation / acute respiratory failure with hypoxia - Respiratory status is stable. Patient is on 1 L nasal cannula oxygen support. We'll try to wean it down today.  NSTEMI (non-ST elevated myocardial infarction) - Troponin elevation seen on the admission, reached a plateau at 0.3 and subsequently trended down. Likey secondary to demand ischemia from severe sepsis and septic shock. - Cardiology was consulted. Recommended stopping heparin drip. Patient was on heparin drip only for couple of hours on the admission. - Heart rate controlled with current metoprolol dose, metoprolol 50 mg by mouth twice a day - May stop telemetry today.  DVT Prophylaxis  - SCDs bilaterally    Code Status: Full.  Family Communication: plan of care discussed with the patient's granddaughter [name] at the bedside  Disposition Plan: needs PT evaluation for safe discharge plan. Likely discharge innext 48 -72 hours   IV access:  Peripheral IV  Procedures and diagnostic studies:   Ct Abdomen Pelvis W Contrast 02/28/2014 1. Diffuse acute colitis noted, with partial sparing of the ascending colon, and surrounding soft tissue inflammation and trace fluid. This is either infectious or inflammatory in nature. 2. Bladder wall thickening is suspicious for cystitis,  as it is new from the prior study. 3. Vaguely decreased enhancement at the anterior aspect of the right kidney is apparently new from the prior study, and may reflect mild focal pyelonephritis. Would correlate for associated symptoms. 4. Relatively diffuse patchy right-sided pneumonia noted. 5. Trace ascites within the abdomen and pelvis is thought to reflect the colonic process. Mild edema about the small bowel and pancreas are thought to be reactive. This is most prominent about the first and second segments of the duodenum, of uncertain significance. 6. Pneumobilia likely reflects prior instrumentation at the duodenal ampulla. 7. Scattered small bilateral renal cysts; mild left renal scarring. 8. Scattered calcification along the abdominal aorta and its branches. 9. Right convex thoracolumbar scoliosis, with underlying mild degenerative change.   Dg Chest Port 1 View 22/2016 No acute cardiopulmonary process.   Medical Consultants:  Cardiology - signed off Gastroenterology - signed off  Other Consultants:  Physical therapy   IAnti-Infectives:   Vancomycin 03/01/2014 --> 03/03/2014  Zosyn 02/032016 -->    Leisa Lenz, MD  [facility] Pager [phone]  If 7PM-7AM, please contact night-coverage [email or web address] Password [email or web address] 03/07/2014, 10:27 AM   LOS: 7 days    HPI/Subjective: No acute overnight events.  Objective: Filed Vitals:   03/06/14 2021 03/07/14 0200 03/07/14 0510 03/07/14 1019  BP: 158/74 122/62 154/85   Pulse: 89 76 80   Temp: 98.1 F (36.7 C) 97.6 F (36.4 C) 97.6 F (36.4 C)   TempSrc: Oral Oral Oral   Resp: $Remo'18 16 18   'Pignu$ Height:      Weight:      SpO2: 100% 98% 100% 98%    Intake/Output Summary (Last 24 hours) at 03/07/14 1027 Last data filed at 03/07/14 4315  Gross  per 24 hour  Intake   1270 ml  Output   2450 ml  Net  -1180 ml    Exam:   General: Pt is not in acute distress  Cardiovascular: RRR, appreciate  S1/S2  Respiratory: Bilateral air entry, no wheezing  Abdomen: Nontender, nondistended, positive bowel sounds  Extremities: No lower extremity swelling, bilateral pulses palpable  Neuro: Nonfocal  Data Reviewed: Basic Metabolic Panel:  Recent Labs Lab 02/28/14 1727 03/01/14 0304  NA 135 137  K 3.5 3.4*  CL 96 107  CO2 25 26  GLUCOSE 161* 117*  BUN 15 16  CREATININE 0.72 0.60  CALCIUM 8.2* 6.7*   Liver Function Tests:  Recent Labs Lab 02/28/14 1727  AST 41*  ALT 23  ALKPHOS 100  BILITOT 0.7  PROT 6.7  ALBUMIN 3.6    Recent Labs Lab 02/28/14 1727  LIPASE 18   No results for input(s): AMMONIA in the last 168 hours. CBC:  Recent Labs Lab 02/28/14 1727  03/03/14 0413 03/04/14 0350 03/05/14 0348 03/06/14 0417 03/07/14 0510  WBC 17.7*  < > 3.7* 4.1 3.6* 3.9* 4.0  NEUTROABS 15.3*  --   --   --   --   --   --   HGB 17.9*  < > 11.3* 11.0* 10.7* 10.4* 10.8*  HCT 53.3*  < > 35.7* 34.0* 33.0* 33.3* 34.7*  MCV 93.8  < > 97.5 96.3 96.5 97.4 97.2  PLT 129*  < > 93* 95* 123* 144* 175  < > = values in this interval not displayed. Cardiac Enzymes:  Recent Labs Lab 03/01/14 0304 03/01/14 0854 03/03/14 1306 03/03/14 1946 03/04/14 0029  TROPONINI 0.16* 0.11* <0.03 <0.03 0.03   BNP: Invalid input(s): POCBNP CBG: No results for input(s): GLUCAP in the last 168 hours.  Blood Culture (routine x 2)     Status: None (Preliminary result)   Collection Time: 02/28/14  5:25 PM  Result Value Ref Range Status   Specimen Description BLOOD RIGHT WRIST  Final   Special Requests BOTTLES DRAWN AEROBIC ONLY 3ML  Final   Culture   Final           BLOOD CULTURE RECEIVED NO GROWTH TO DATE    Report Status PENDING  Incomplete  Blood Culture (routine x 2)     Status: None (Preliminary result)   Collection Time: 02/28/14  5:41 PM  Result Value Ref Range Status   Specimen Description BLOOD RAC  Final   Special Requests BOTTLES DRAWN AEROBIC ONLY 3ML  Final   Culture    Final           BLOOD CULTURE RECEIVED NO GROWTH TO DATE    Report Status PENDING  Incomplete  Urine culture     Status: None   Collection Time: 02/28/14  5:49 PM  Result Value Ref Range Status   Specimen Description URINE, CATHETERIZED  Final   Special Requests NONE  Final   Colony Count   Final    >=100,000 COLONIES/ML Performed at [facility]    Culture   Final    KLEBSIELLA PNEUMONIAE Performed at [facility]    Report Status 03/03/2014 FINAL  Final   Organism ID, Bacteria KLEBSIELLA PNEUMONIAE  Final      Susceptibility   Klebsiella pneumoniae - MIC*    AMPICILLIN >=32 RESISTANT Resistant     CEFAZOLIN <=4 SENSITIVE Sensitive     CEFTRIAXONE <=1 SENSITIVE Sensitive     CIPROFLOXACIN <=0.25 SENSITIVE Sensitive  GENTAMICIN <=1 SENSITIVE Sensitive     LEVOFLOXACIN <=0.12 SENSITIVE Sensitive     NITROFURANTOIN 128 RESISTANT Resistant     TOBRAMYCIN <=1 SENSITIVE Sensitive     TRIMETH/SULFA <=20 SENSITIVE Sensitive     PIP/TAZO <=4 SENSITIVE Sensitive     * KLEBSIELLA PNEUMONIAE  MRSA PCR Screening     Status: None   Collection Time: 02/28/14 10:49 PM  Result Value Ref Range Status   MRSA by PCR NEGATIVE NEGATIVE Final  Clostridium Difficile by PCR     Status: None   Collection Time: 03/03/14  8:24 AM  Result Value Ref Range Status   C difficile by pcr NEGATIVE NEGATIVE Final    Comment: Performed at [facility]  Stool culture     Status: None (Preliminary result)   Collection Time: 03/03/14  9:52 PM  Result Value Ref Range Status   Specimen Description STOOL  Final   Special Requests NONE  Final   Culture   Final    ABUNDANT CANDIDA ALBICANS NO SUSPICIOUS COLONIES, CONTINUING TO HOLD Performed at [facility]    Report Status PENDING  Incomplete     Scheduled Meds: . antiseptic oral rinse  7 mL Mouth Rinse BID  . aspirin  325 mg Oral Daily  . calcium-vitamin D  1 tablet Oral BID WC  . chlorhexidine  15 mL Mouth Rinse BID   . feeding supplement (RESOURCE BREEZE)  1 Container Oral TID BM  . gabapentin  100 mg Oral BID  . metoprolol tartrate  50 mg Oral BID  . pantoprazole  40 mg Oral BID  . piperacillin-tazobactam (ZOSYN)  IV  3.375 g Intravenous Q8H  . saccharomyces boulardii  250 mg Oral BID   Continuous Infusions: . sodium chloride 50 mL/hr at 03/07/14 0805

## 2014-03-07 NOTE — Progress Notes (Signed)
NUTRITION FOLLOW UP  Intervention:   - D/C Resource Breeze - Continue Magic cup TID with meals, each supplement provides 290 kcal and 9 grams of protein - Continue pm snack - RD will continue to monitor  Nutrition Dx:   Inadequate oral intake related to N/V/D as evidenced by poor po and gradual wt loss; ongoing  Goal:   Pt to meet >/= 90% of their estimated nutrition needs; not met  Monitor:   Weight trend, po intake, labs  Assessment:   81 y.o. female with a history of COPD Gold Stage C, Chronic CHF, HTN, and Hyperlipidemia and severe Scoliosis who was brought to the ED due to increased Nausea and Vomiting and Diarrhea since last night. She has had fevers and chills as well, she denies any hematemesis, or melena, or hematochezia. She was found to have diffuse colitis and RLL pneumonia on CT scan.  2/3: Spoke with pt and family in room. - Pt has had a 5 lb gradual wt loss over the past year.  - Pt nauseated, but says that it is improving.  - She normally eats "pretty well" and likes foods such as ice cream, sweets, chicken and hamburgers.  - Pt does not like Ensure Complete and would rather try clear liquid supplement. She likes ice cream and agreed to try magic cups and a pm snack once diet advanced.   2/9: - Pt eating lunch during RD visit. She had ~25% of her meat, 75% of her mashed potatoes, 100% of her roll, and 100% of her magic cup.  -Pt does not like resource breeze or ensure complete - Per family, pt's appetite has remained poor.  - 8pm snack ordered.  Labs reviewed  Height: Ht Readings from Last 1 Encounters:  02/28/14 5' (1.524 m)    Weight Status:   Wt Readings from Last 1 Encounters:  03/04/14 125 lb 7.1 oz (56.9 kg)    Re-estimated needs:  Kcal: 1400-1600 Protein: 70-80 g Fluid: 1.6 L/day  Skin: intact  Diet Order: Diet regular   Intake/Output Summary (Last 24 hours) at 03/07/14 1416 Last data filed at 03/07/14 0929  Gross per 24 hour   Intake   1120 ml  Output   2450 ml  Net  -1330 ml    Last BM: 2/8   Labs:   Recent Labs Lab 02/28/14 1727 03/01/14 0304  NA 135 137  K 3.5 3.4*  CL 96 107  CO2 25 26  BUN 15 16  CREATININE 0.72 0.60  CALCIUM 8.2* 6.7*  GLUCOSE 161* 117*    CBG (last 3)  No results for input(s): GLUCAP in the last 72 hours.  Scheduled Meds: . antiseptic oral rinse  7 mL Mouth Rinse BID  . aspirin  325 mg Oral Daily  . calcium-vitamin D  1 tablet Oral BID WC  . chlorhexidine  15 mL Mouth Rinse BID  . feeding supplement (RESOURCE BREEZE)  1 Container Oral TID BM  . gabapentin  100 mg Oral BID  . metoprolol tartrate  50 mg Oral BID  . [START ON 03/08/2014] pantoprazole  40 mg Oral Daily  . piperacillin-tazobactam (ZOSYN)  IV  3.375 g Intravenous Q8H  . saccharomyces boulardii  250 mg Oral BID    Continuous Infusions: . sodium chloride 50 mL/hr at 03/07/14 0805    Laurette Schimke MS, RD, LDN

## 2014-03-08 LAB — CBC
HCT: 34.5 % — ABNORMAL LOW (ref 36.0–46.0)
Hemoglobin: 10.9 g/dL — ABNORMAL LOW (ref 12.0–15.0)
MCH: 30.6 pg (ref 26.0–34.0)
MCHC: 31.6 g/dL (ref 30.0–36.0)
MCV: 96.9 fL (ref 78.0–100.0)
Platelets: 177 10*3/uL (ref 150–400)
RBC: 3.56 MIL/uL — ABNORMAL LOW (ref 3.87–5.11)
RDW: 13.3 % (ref 11.5–15.5)
WBC: 4.3 10*3/uL (ref 4.0–10.5)

## 2014-03-08 MED ORDER — PIPERACILLIN-TAZOBACTAM 3.375 G IVPB
3.3750 g | Freq: Three times a day (TID) | INTRAVENOUS | Status: AC
  Administered 2014-03-08 (×2): 3.375 g via INTRAVENOUS
  Filled 2014-03-08 (×3): qty 50

## 2014-03-08 NOTE — Progress Notes (Signed)
Clinical Social Work  CSW met with patient at bedside to discuss that patient is not meeting criteria for CIR at this time but to discuss SNF options. Patient reports she feels weak and knows she cannot return home yet but wants CSW to speak with her dtr [name]) about plans. Patient reports she is upset that she cannot return straight home and has always prided herself that she is independent. Patient reports she does not want to be a burden to family and knows that ST SNF will be best for her and family.  CSW spoke with dtr via phone who reports she was disappointed that patient could not go to CIR but would prefer placement at [facility] or [facility]. Both SNFs can provide bed offer so dtr reports she will talk with family to make final decision.  CSW will continue to follow.  [place], [place] [phone]

## 2014-03-08 NOTE — Progress Notes (Signed)
CARE MANAGEMENT NOTE 03/08/2014  Patient:  [name] [name], [name] [name]   Account Number:  [id number]  Date Initiated:  03/01/2014  Documentation initiated by:  Hurschel Paynter  Subjective/Objective Assessment:   sepsis     Action/Plan:   home when stable   Anticipated DC Date:  03/09/2014   Anticipated DC Plan:  HOME/SELF CARE  In-house referral  NA      DC Planning Services  NA      [facility] Choice  NA   Choice offered to / List presented to:  NA   DME arranged  NA      DME agency  NA     [place] arranged  NA      [place] agency  NA   Status of service:  In process, will continue to follow Medicare Important Message given?   (If response is "NO", the following Medicare IM given date fields will be blank) Date Medicare IM given:   Medicare IM given by:   Date Additional Medicare IM given:   Additional Medicare IM given by:    Discharge Disposition:    Per UR Regulation:  Reviewed for med. necessity/level of care/duration of stay  If discussed at [place] of Stay Meetings, dates discussed:    Comments:  [id number]/tct-Dr Charlies Silvers informed that once iv abx is completed patient will need to be released unless condition changes or more information as to need for continued stay given.  Will dcd patient in the am/tct-DTG-[name] Pendry message left on answering machine to please return call for preparation of patient being dcd on [phone].  02/08/20216/Wood Novacek L. Rosana Hoes, RN, BSN, CCM: Chart review for medical necessity and patient discharge needs. Case Manager will follow for patient condition changes. [id number] L. Rosana Hoes, RN, BSN, CCM: CHART NOTE FOR PROGRESSION: Acute colitis suspected infectious etiology. Stool studies pending Plan: Continue antibiotics while awaiting stool cultures. We'll advance to full liquid diet today. We'll monitor stools and hemoglobin    [id number]/Julien Berryman Rosana Hoes, RN, BSN, [place]: [phone] Case management. Chart reviewed for discharge planning and present  needs. Discharge needs: none present at time of review. Next chart review due:  [id number]

## 2014-03-08 NOTE — Progress Notes (Signed)
Rehab Admissions Coordinator Note:  Patient was screened by Retta Diones for appropriateness for an Inpatient Acute Rehab Consult.  At this time, we are recommending [place] or [facility] therapies.  Patient was able to ambulate 120 ft with minguard assistance.  Likely is already doing too well to meet criteria for acute inpatient rehab admission.    Jodell Cipro M 03/08/2014, 3:24 PM  I can be reached at [phone].

## 2014-03-08 NOTE — Progress Notes (Signed)
Patient ID: [name] [name], female   DOB: [birth year], 81 y.o.   MRN: [id number] [facility] PROGRESS NOTE  [name] [name] EWO:[id number] DOB: [birth year] DOA: 02/28/2014 PCP: Lemont Fillers., NP  Brief narrative:    81 year-old female with past medical history hypertension, COPD who presented to [facility] ED with complaints of nausea and vomiting started 24 hours prior to this admission. Per family at the bedside, patient had fevers and chills. No reports of blood in the stool or melanotic stool. No reports of diarrhea.  On admission, patient was hypotensive, tachycardic, tachypneic, febrile with oxygen saturation of 93% on nasal cannula oxygen support. Her blood work was significant for elevated white blood cell count of 17.7, platelets 129. Initial troponin level was 0.31. Patient was started on heparin drip on the admission. She was subsequently seen by cardiology who recommended stopping heparin drip because troponin level likely elevated from demand ischemia because of sepsis.  Chest x-ray showed no acute cardiopulmonary process. CT abdomen revealed diffuse acute colitis, bladder wall thickening suspicious for cystitis, possible right side focal pyelonephritis, relatively diffuse patchy right-sided pneumonia. Patient was started on broad-spectrum antibiotics, vancomycin and Zosyn for treatment of multiple infections including pneumonia, UTI and sepsis. Patient has been seen by GI in consultation. GI did not recommend endoscopy or sigmoidoscopy because patient started to improve and tolerating by mouth intake.  Assessment/Plan:     Principal problem: Severe sepsis / septic shock secondary to multiple infections, pneumonia, Klebsiella urinary tract infection and colitis / leukocytosis - Sepsis criteria met on the admission considering pt had hypotension even when given IV fluids. In addition she was tachycardic and had increased work of breathing. Her oxygen saturation was in the low 90s but this  is with nasal cannula oxygen support. Blood work showed leukocytosis of 17.7, elevated lactic acid at 4.84. Urinalysis revealed many bacteria but leukocytes and nitrites were not detected on urinalysis. CT abdomen suspicious for focal right-sided pyelonephritis, relatively diffuse patchy right-sided pneumonia and diffuse colitis. - Patient was in step down unit on the admission. She was transferred to floor on 03/06/2014. She continues to be hemodynamically stable. - Patient was on vancomycin and Zosyn since the admission. Because there was no evidence of staph aureus or MRSA infection we have stopped vancomycin 03/03/2014 - Blood cultures to date are negative. Urine culture positive for Klebsiella. Continue Zosyn. - Diet placed as regular. She tolerates it well. - Follow up on physical therapy evaluation.   Active Problems: Community acquired pneumonia - CT scan on the admission showed relatively diffuse patchy right-sided pneumonia.  - Respiratory status stable. - Patient was on vancomycin Zosyn on the admission. Vanco stopped 03/03/2014. Patient is currently on Zosyn.  Klebsiella UTI / possible right-sided pyelonephritis - CT scan on the admission showed possible right-sided pyelonephritis. Urinalysis on the admission showed many bacteria. Urine culture positive for Klebsiella. - Continue Zosyn, today is day 7. We will stop Zosyn after today.  Diffuse colitis - Diffuse colitis seen on CT scan. Stool for C. difficile sent and it was negative. No episodes of diarrhea prior to the admission or since the admission. - GI has seen the patient in consultation. No further workup indicated at this time and they have signed off 03/06/2014. - Continue probiotic for colitis and Protonix for GERD - Patient tolerates regular diet  Thrombocytopenia - Likely secondary to heparin drip she received on the admission. She was then on Lovenox for DVT prophylaxis. - Heparin stopped 03/01/2014, Lovenox  stopped 03/02/2014.  -  Platelet count is now WNL.  Normocytic anemia - Initial hemoglobin elevated at 17 which was likely secondary to hemoconcentration. - Hemoglobin remains stable. No reports of bleeding. No indications for transfusion.  Hypokalemia - Secondary to GI losses. Supplemented.  Acute COPD exacerbation / acute respiratory failure with hypoxia - Respiratory status is stable. On 1 L nasal cannula oxygen support.  NSTEMI (non-ST elevated myocardial infarction) - Troponin elevation seen on the admission, reached a plateau at 0.3 and subsequently trended down. Likey secondary to demand ischemia from severe sepsis and septic shock. - Cardiology was consulted. Recommended stopping heparin drip. Patient was on heparin drip only for couple of hours on the admission. - Heart rate controlled with metoprolol 50 mg by mouth twice a day  DVT Prophylaxis  - SCDs bilaterally    Code Status: Full.  Family Communication: plan of care discussed with the patient's granddaughter [name] at the bedside  Disposition Plan: Likely discharge in the next 1-2 days.  IV access:  Peripheral IV  Procedures and diagnostic studies:    Ct Abdomen Pelvis W Contrast 02/28/2014 1. Diffuse acute colitis noted, with partial sparing of the ascending colon, and surrounding soft tissue inflammation and trace fluid. This is either infectious or inflammatory in nature. 2. Bladder wall thickening is suspicious for cystitis, as it is new from the prior study. 3. Vaguely decreased enhancement at the anterior aspect of the right kidney is apparently new from the prior study, and may reflect mild focal pyelonephritis. Would correlate for associated symptoms. 4. Relatively diffuse patchy right-sided pneumonia noted. 5. Trace ascites within the abdomen and pelvis is thought to reflect the colonic process. Mild edema about the small bowel and pancreas are thought to be reactive. This is most prominent about the first and  second segments of the duodenum, of uncertain significance. 6. Pneumobilia likely reflects prior instrumentation at the duodenal ampulla. 7. Scattered small bilateral renal cysts; mild left renal scarring. 8. Scattered calcification along the abdominal aorta and its branches. 9. Right convex thoracolumbar scoliosis, with underlying mild degenerative change.   Dg Chest Port 1 View 22/2016 No acute cardiopulmonary process.   Medical Consultants:  Cardiology - signed off Gastroenterology - signed off  Other Consultants:  Physical therapy  IAnti-Infectives:   Vancomycin 03/01/2014 --> 03/03/2014  Zosyn 02/032016 -->   Leisa Lenz, MD  [facility] Pager [phone]  If 7PM-7AM, please contact night-coverage [email or web address] Password [facility] 03/08/2014, 10:57 AM   LOS: 8 days    HPI/Subjective: No acute overnight events.  Objective: Filed Vitals:   03/07/14 2123 03/08/14 0200 03/08/14 0430 03/08/14 0853  BP: 149/72  163/69 157/64  Pulse: 93 83 80 84  Temp: 98.2 F (36.8 C)  98.2 F (36.8 C) 98.2 F (36.8 C)  TempSrc: Oral  Oral Oral  Resp: $Remo'18  18 18  'jImjX$ Height:      Weight:      SpO2: 98% 98% 97% 95%    Intake/Output Summary (Last 24 hours) at 03/08/14 1057 Last data filed at 03/08/14 0700  Gross per 24 hour  Intake   1420 ml  Output    400 ml  Net   1020 ml    Exam:   General: Patient sitting in chair, no acute distress  Cardiovascular: RRR, appreciate (+)  Respiratory: Clear to auscultation bilaterally, no rhonchi  Abdomen: Bowel sounds appreciated, non-distended and nontender to palpation abdomen  Extremities: Bilateral pulses, no swelling  Neuro: No focal deficits  Data Reviewed: Basic Metabolic Panel: No results  for input(s): NA, K, CL, CO2, GLUCOSE, BUN, CREATININE, CALCIUM, MG, PHOS in the last 168 hours. Liver Function Tests: No results for input(s): AST, ALT, ALKPHOS, BILITOT, PROT, ALBUMIN in the last 168 hours. No results for  input(s): LIPASE, AMYLASE in the last 168 hours. No results for input(s): AMMONIA in the last 168 hours. CBC:  Recent Labs Lab 03/04/14 0350 03/05/14 0348 03/06/14 0417 03/07/14 0510 03/08/14 0440  WBC 4.1 3.6* 3.9* 4.0 4.3  HGB 11.0* 10.7* 10.4* 10.8* 10.9*  HCT 34.0* 33.0* 33.3* 34.7* 34.5*  MCV 96.3 96.5 97.4 97.2 96.9  PLT 95* 123* 144* 175 177   Cardiac Enzymes:  Recent Labs Lab 03/03/14 1306 03/03/14 1946 03/04/14 0029  TROPONINI <0.03 <0.03 0.03   BNP: Invalid input(s): POCBNP CBG: No results for input(s): GLUCAP in the last 168 hours.  Recent Results (from the past 240 hour(s))  Blood Culture (routine x 2)     Status: None   Collection Time: 02/28/14  5:25 PM  Result Value Ref Range Status   Specimen Description BLOOD RIGHT WRIST  Final   Special Requests BOTTLES DRAWN AEROBIC ONLY 3ML  Final   Culture   Final    NO GROWTH 5 DAYS Performed at [facility]    Report Status 03/07/2014 FINAL  Final  Blood Culture (routine x 2)     Status: None   Collection Time: 02/28/14  5:41 PM  Result Value Ref Range Status   Specimen Description BLOOD RAC  Final   Special Requests BOTTLES DRAWN AEROBIC ONLY 3ML  Final   Culture   Final    NO GROWTH 5 DAYS Performed at [facility]    Report Status 03/07/2014 FINAL  Final  Urine culture     Status: None   Collection Time: 02/28/14  5:49 PM  Result Value Ref Range Status   Specimen Description URINE, CATHETERIZED  Final   Special Requests NONE  Final   Colony Count   Final    >=100,000 COLONIES/ML Performed at [facility]    Culture   Final    KLEBSIELLA PNEUMONIAE Performed at [facility]    Report Status 03/03/2014 FINAL  Final   Organism ID, Bacteria KLEBSIELLA PNEUMONIAE  Final      Susceptibility   Klebsiella pneumoniae - MIC*    AMPICILLIN >=32 RESISTANT Resistant     CEFAZOLIN <=4 SENSITIVE Sensitive     CEFTRIAXONE <=1 SENSITIVE Sensitive     CIPROFLOXACIN <=0.25  SENSITIVE Sensitive     GENTAMICIN <=1 SENSITIVE Sensitive     LEVOFLOXACIN <=0.12 SENSITIVE Sensitive     NITROFURANTOIN 128 RESISTANT Resistant     TOBRAMYCIN <=1 SENSITIVE Sensitive     TRIMETH/SULFA <=20 SENSITIVE Sensitive     PIP/TAZO <=4 SENSITIVE Sensitive     * KLEBSIELLA PNEUMONIAE  MRSA PCR Screening     Status: None   Collection Time: 02/28/14 10:49 PM  Result Value Ref Range Status   MRSA by PCR NEGATIVE NEGATIVE Final    Comment:        The GeneXpert MRSA Assay (FDA approved for NASAL specimens only), is one component of a comprehensive MRSA colonization surveillance program. It is not intended to diagnose MRSA infection nor to guide or monitor treatment for MRSA infections.   Clostridium Difficile by PCR     Status: None   Collection Time: 03/03/14  8:24 AM  Result Value Ref Range Status   C difficile by pcr NEGATIVE NEGATIVE Final  Comment: Performed at [facility]  Stool culture     Status: None   Collection Time: 03/03/14  9:52 PM  Result Value Ref Range Status   Specimen Description STOOL  Final   Special Requests NONE  Final   Culture   Final    ABUNDANT CANDIDA ALBICANS NO SALMONELLA, SHIGELLA, CAMPYLOBACTER, YERSINIA, OR E.COLI 0157:[id number] ISOLATED Performed at [facility]    Report Status 03/07/2014 FINAL  Final     Scheduled Meds: . antiseptic oral rinse  7 mL Mouth Rinse BID  . aspirin  325 mg Oral Daily  . calcium-vitamin D  1 tablet Oral BID WC  . chlorhexidine  15 mL Mouth Rinse BID  . gabapentin  100 mg Oral BID  . metoprolol tartrate  50 mg Oral BID  . pantoprazole  40 mg Oral Daily  . piperacillin-tazobactam (ZOSYN)  IV  3.375 g Intravenous Q8H  . saccharomyces boulardii  250 mg Oral BID   Continuous Infusions: . sodium chloride 50 mL/hr at 03/08/14 0401

## 2014-03-08 NOTE — Progress Notes (Signed)
Physical Therapy Treatment Patient Details Name: Bayyinah Dukeman MRN: [id number] DOB: 1925/09/03 Today's Date: 03/08/2014    History of Present Illness 81 year-old female with past medical history hypertension, COPD who presented to [facility] ED with complaints of nausea and vomiting and admitted for Severe sepsis, septic shock secondary to multiple infections, pneumonia, Klebsiella urinary tract infection and colitis    PT Comments    Family in room today and granddaughter is an ortho PA so pt had already been performing exercises in the chair today.  Pt able to improve ambulation distance today with RW on 1L O2 [place].  Granddaughter requesting CIR screen however also discussed ST-SNF upon d/c if CIR is unable to accept pt.  Pt motivated and has good family support.  Pt and family would like pt to return to living alone and modified independent functioning.   Follow Up Recommendations  CIR;Supervision for mobility/OOB     Equipment Recommendations  None recommended by PT    Recommendations for Other Services       Precautions / Restrictions Precautions Precautions: Fall Precaution Comments: monitor sats    Mobility  Bed Mobility               General bed mobility comments: pt up in recliner on arrival  Transfers Overall transfer level: Needs assistance Equipment used: Rolling walker (2 wheeled) Transfers: Sit to/from Stand Sit to Stand: Min assist         General transfer comment: verbal cues for safe technique, assist to steady with rise  Ambulation/Gait Ambulation/Gait assistance: Min guard Ambulation Distance (Feet): 120 Feet Assistive device: Rolling walker (2 wheeled) Gait Pattern/deviations: Step-through pattern;Trunk flexed;Decreased stride length     General Gait Details: very slow pace, ambulated on 1L O2 and able to tolerate improvement in distance compared to last visit   Stairs            Wheelchair Mobility    Modified Rankin (Stroke Patients  Only)       Balance                                    Cognition Arousal/Alertness: Awake/alert Behavior During Therapy: WFL for tasks assessed/performed Overall Cognitive Status: Within Functional Limits for tasks assessed                      Exercises      General Comments        Pertinent Vitals/Pain      Home Living                      Prior Function            PT Goals (current goals can now be found in the care plan section) Progress towards PT goals: Progressing toward goals    Frequency  Min 3X/week    PT Plan Discharge plan needs to be updated    Co-evaluation             End of Session Equipment Utilized During Treatment: Oxygen Activity Tolerance: Patient tolerated treatment well Patient left: in chair;with call bell/phone within reach;with family/visitor present     Time: 5916-3846 PT Time Calculation (min) (ACUTE ONLY): 25 min  Charges:  $Gait Training: 8-22 mins                    G Codes:  Angelia Hazell,KATHrine E 03/08/2014, 3:16 PM Carmelia Bake, PT, DPT 03/08/2014 Pager: [phone]

## 2014-03-09 ENCOUNTER — Telehealth: Payer: Self-pay | Admitting: Family

## 2014-03-09 DIAGNOSIS — A4189 Other specified sepsis: Secondary | ICD-10-CM | POA: Diagnosis not present

## 2014-03-09 DIAGNOSIS — I10 Essential (primary) hypertension: Secondary | ICD-10-CM | POA: Diagnosis not present

## 2014-03-09 DIAGNOSIS — J449 Chronic obstructive pulmonary disease, unspecified: Secondary | ICD-10-CM | POA: Diagnosis not present

## 2014-03-09 DIAGNOSIS — N390 Urinary tract infection, site not specified: Secondary | ICD-10-CM | POA: Diagnosis not present

## 2014-03-09 DIAGNOSIS — M6281 Muscle weakness (generalized): Secondary | ICD-10-CM | POA: Diagnosis not present

## 2014-03-09 DIAGNOSIS — I248 Other forms of acute ischemic heart disease: Secondary | ICD-10-CM | POA: Diagnosis not present

## 2014-03-09 DIAGNOSIS — J189 Pneumonia, unspecified organism: Secondary | ICD-10-CM | POA: Diagnosis not present

## 2014-03-09 DIAGNOSIS — J439 Emphysema, unspecified: Secondary | ICD-10-CM | POA: Diagnosis not present

## 2014-03-09 DIAGNOSIS — G629 Polyneuropathy, unspecified: Secondary | ICD-10-CM | POA: Diagnosis not present

## 2014-03-09 DIAGNOSIS — I509 Heart failure, unspecified: Secondary | ICD-10-CM | POA: Diagnosis not present

## 2014-03-09 DIAGNOSIS — K219 Gastro-esophageal reflux disease without esophagitis: Secondary | ICD-10-CM | POA: Diagnosis not present

## 2014-03-09 DIAGNOSIS — A419 Sepsis, unspecified organism: Secondary | ICD-10-CM | POA: Diagnosis not present

## 2014-03-09 DIAGNOSIS — K5190 Ulcerative colitis, unspecified, without complications: Secondary | ICD-10-CM | POA: Diagnosis not present

## 2014-03-09 DIAGNOSIS — R2681 Unsteadiness on feet: Secondary | ICD-10-CM | POA: Diagnosis not present

## 2014-03-09 DIAGNOSIS — R6520 Severe sepsis without septic shock: Secondary | ICD-10-CM | POA: Diagnosis not present

## 2014-03-09 DIAGNOSIS — R5381 Other malaise: Secondary | ICD-10-CM | POA: Diagnosis not present

## 2014-03-09 LAB — CBC
HCT: 35.4 % — ABNORMAL LOW (ref 36.0–46.0)
Hemoglobin: 10.9 g/dL — ABNORMAL LOW (ref 12.0–15.0)
MCH: 30.2 pg (ref 26.0–34.0)
MCHC: 30.8 g/dL (ref 30.0–36.0)
MCV: 98.1 fL (ref 78.0–100.0)
Platelets: 195 10*3/uL (ref 150–400)
RBC: 3.61 MIL/uL — ABNORMAL LOW (ref 3.87–5.11)
RDW: 13.4 % (ref 11.5–15.5)
WBC: 3.3 10*3/uL — ABNORMAL LOW (ref 4.0–10.5)

## 2014-03-09 MED ORDER — ASPIRIN 325 MG PO TABS: 325.0000 mg | ORAL_TABLET | Freq: Every day | ORAL | Status: DC

## 2014-03-09 MED ORDER — OXYCODONE-ACETAMINOPHEN 5-325 MG PO TABS: 1.0000 | ORAL_TABLET | Freq: Four times a day (QID) | ORAL | Status: DC | PRN

## 2014-03-09 MED ORDER — ONDANSETRON HCL 4 MG PO TABS: 4.0000 mg | ORAL_TABLET | Freq: Four times a day (QID) | ORAL | Status: DC | PRN

## 2014-03-09 MED ORDER — SACCHAROMYCES BOULARDII 250 MG PO CAPS: 250.0000 mg | ORAL_CAPSULE | Freq: Two times a day (BID) | ORAL | Status: DC

## 2014-03-09 MED ORDER — ALUM & MAG HYDROXIDE-SIMETH 200-200-20 MG/5ML PO SUSP: 30.0000 mL | Freq: Four times a day (QID) | ORAL | Status: DC | PRN

## 2014-03-09 MED ORDER — METOPROLOL TARTRATE 50 MG PO TABS: 50.0000 mg | ORAL_TABLET | Freq: Two times a day (BID) | ORAL | Status: DC

## 2014-03-09 MED ORDER — ACETAMINOPHEN 325 MG PO TABS: 650.0000 mg | ORAL_TABLET | Freq: Four times a day (QID) | ORAL | Status: DC | PRN

## 2014-03-09 NOTE — Progress Notes (Signed)
CSW continuing to follow.   Per MD, pt medically ready for discharge today.   CSW received phone call from pt daughter that pt had decided to return home.   CSW met with pt and pt daughter at bedside to discuss. CSW explored pt feelings regarding [facility] SN[facility]. Pt stated that she feels that she can do at home what they would do for her in rehab. CSW provided education surrounding rehab at [facility] and explained that pt would get more therapy at rehab then she would at home. Pt daughter expressed that it is a difficult decision for pt as it is important for pt to maintain her independence. Pt shared that she was doing everything on her own until this admission. Pt shared that she has never been to [facility] and has a "fear of the unknown" regarding going to SNF for rehab. CSW validated pt feelings and discussed with pt that if she chooses to go to [facility] and is not pleased then she can go home from [facility] at any time.Pt expressed understanding and states that she feels that it will be beneficial to try rehab at SNF before returning home. Pt stated that she prefers [facility] and would like a private room at the facility in order to maintain privacy while at [facility].   CSW contacted [facility] and confirmed facility had private room available today.  CSW met with pt and pt daughter at bedside and notified that [facility] has private room available today. Pt agreeable to [facility].   CSW notified MD.  CSW to facilitate pt discharge needs to [facility] this afternoon.  Alison Murray, MSW, LCSW Clinical Social Work Coverage for [place], [place]

## 2014-03-09 NOTE — Discharge Summary (Signed)
Physician Discharge Summary  [name] [name] YQI:[id number] DOB: October 22, 1925 DOA: 02/28/2014  PCP: [name], NP  Admit date: 02/28/2014 Discharge date: 03/09/2014  Recommendations for Outpatient Follow-up:  1. Check CBC and BMP per SNF protocol.  Discharge Diagnoses:  Active Problems:   Essential hypertension   COPD with emphysema, gold stage C.   Pancolitis   Severe sepsis   Pneumonia   Demand ischemia   NSTEMI (non-ST elevated myocardial infarction)   CHF (congestive heart failure)    Discharge Condition: stable   Diet recommendation: as tolerated   History of present illness:  81 year-old female with past medical history hypertension, COPD who presented to [facility] ED with complaints of nausea and vomiting started 24 hours prior to this admission. Per family at the bedside, patient had fevers and chills. No reports of blood in the stool or melanotic stool. No reports of diarrhea.  On admission, patient was hypotensive, tachycardic, tachypneic, febrile with oxygen saturation of 93% on nasal cannula oxygen support. Her blood work was significant for elevated white blood cell count of 17.7, platelets 129. Initial troponin level was 0.31. Patient was started on heparin drip on the admission. She was subsequently seen by cardiology who recommended stopping heparin drip because troponin level likely elevated from demand ischemia because of sepsis.  Chest x-ray showed no acute cardiopulmonary process. CT abdomen revealed diffuse acute colitis, bladder wall thickening suspicious for cystitis, possible right side focal pyelonephritis, relatively diffuse patchy right-sided pneumonia. Patient was started on broad-spectrum antibiotics, vancomycin and Zosyn for treatment of multiple infections including pneumonia, UTI and sepsis. Patient has been seen by GI in consultation. GI did not recommend endoscopy or sigmoidoscopy because patient started to improve and tolerating by mouth  intake.  Assessment/Plan:     Principal problem: Severe sepsis / septic shock secondary to multiple infections, pneumonia, Klebsiella urinary tract infection and colitis / leukocytosis - Sepsis criteria met on the admission considering pt had hypotension even when given IV fluids. In addition she was tachycardic and had increased work of breathing. Her oxygen saturation was in the low 90s but this is with nasal cannula oxygen support. Blood work showed leukocytosis of 17.7, elevated lactic acid at 4.84. Urinalysis revealed many bacteria but leukocytes and nitrites were not detected on urinalysis. CT abdomen suspicious for focal right-sided pyelonephritis, relatively diffuse patchy right-sided pneumonia and diffuse colitis. - Patient was in step down unit on the admission. She was transferred to floor on 03/06/2014.  - Hemodynamically stable - Patient was on vancomycin and Zosyn since the admission. Because there was no evidence of staph aureus or MRSA infection we have stopped vancomycin 03/03/2014. Zosyn completed 03/08/2014. - Blood culture negative. Urine culture positive for Klebsiella. Adequately treated with Zosyn. - Tolerates regular diet - Per physical therapy evaluation, placement to skilled nursing facility. Patient and family agreeable.   Active Problems: Community acquired pneumonia - CT scan on the admission showed relatively diffuse patchy right-sided pneumonia.  - Respiratory status stable. - Patient adequately treated with broad-spectrum antibiotics. Vanco completed 03/03/2014, Zosyn completed 03/08/2014.  Klebsiella UTI / possible right-sided pyelonephritis - CT scan on the admission showed possible right-sided pyelonephritis. Urinalysis on the admission showed many bacteria. Urine culture positive for Klebsiella. - Patient completed 7 days of Zosyn. Antibiotics not required at the time of discharge.  Diffuse colitis - Diffuse colitis seen on CT scan. Stool for C.  difficile sent and it was negative. No episodes of diarrhea prior to the admission or since the admission. -  GI has seen the patient in consultation. No further workup indicated at this time and they have signed off 03/06/2014. - Continue probiotic for colitis and Protonix for GERD - Tolerating regular diet.  Thrombocytopenia - Likely secondary to heparin drip she received on the admission. She was then on Lovenox for DVT prophylaxis. - Heparin stopped 03/01/2014, Lovenox stopped 03/02/2014.  - Platelet count is now WNL.  Normocytic anemia - Initial hemoglobin elevated at 17 which was likely secondary to hemoconcentration. - Hemoglobin stable. Patient did not require transfusion throughout the hospital stay.  Hypokalemia - Secondary to GI losses. Supplemented.  Acute COPD exacerbation / acute respiratory failure with hypoxia - Respiratory status is stable. May continue nasal cannula oxygen support which is with patient had at home as well.  NSTEMI (non-ST elevated myocardial infarction) - Troponin elevation seen on the admission, reached a plateau at 0.3 and subsequently trended down. Likey secondary to demand ischemia from severe sepsis and septic shock. - Cardiology was consulted. Recommended stopping heparin drip. Patient was on heparin drip only for couple of hours on the admission. - Heart rate controlled with metoprolol 50 mg by mouth twice a day continue metoprolol on discharge.   DVT Prophylaxis  - SCDs bilaterally while patient is in hospital.   Code Status: Full.  Family Communication: plan of care discussed with the patient's granddaughter [name] at the bedside    IV access:  Peripheral IV  Procedures and diagnostic studies:   Ct Abdomen Pelvis W Contrast 02/28/2014 1. Diffuse acute colitis noted, with partial sparing of the ascending colon, and surrounding soft tissue inflammation and trace fluid. This is either infectious or inflammatory in nature. 2.  Bladder wall thickening is suspicious for cystitis, as it is new from the prior study. 3. Vaguely decreased enhancement at the anterior aspect of the right kidney is apparently new from the prior study, and may reflect mild focal pyelonephritis. Would correlate for associated symptoms. 4. Relatively diffuse patchy right-sided pneumonia noted. 5. Trace ascites within the abdomen and pelvis is thought to reflect the colonic process. Mild edema about the small bowel and pancreas are thought to be reactive. This is most prominent about the first and second segments of the duodenum, of uncertain significance. 6. Pneumobilia likely reflects prior instrumentation at the duodenal ampulla. 7. Scattered small bilateral renal cysts; mild left renal scarring. 8. Scattered calcification along the abdominal aorta and its branches. 9. Right convex thoracolumbar scoliosis, with underlying mild degenerative change.   Dg Chest Port 1 View 22/2016 No acute cardiopulmonary process.   Medical Consultants:  Cardiology - signed off Gastroenterology - signed off  Other Consultants:  Physical therapy  IAnti-Infectives:   Vancomycin 03/01/2014 --> 03/03/2014  Zosyn 02/032016 --> 03/08/2014  Signed:  Leisa Lenz, MD  [facility] 03/09/2014, 11:54 AM  Pager #: [phone]   Discharge Exam: Filed Vitals:   03/09/14 0808  BP: 160/66  Pulse: 86  Temp: 98.2 F (36.8 C)  Resp: 18   Filed Vitals:   03/08/14 2216 03/09/14 0159 03/09/14 0520 03/09/14 0808  BP: 137/69 135/61 151/64 160/66  Pulse: 73 67 68 86  Temp: 98.3 F (36.8 C) 97.9 F (36.6 C) 97.8 F (36.6 C) 98.2 F (36.8 C)  TempSrc: Oral Oral Oral Oral  Resp: $Remo'18 16 18 18  'TYNwO$ Height:      Weight:      SpO2: 97% 100% 99% 97%    General: Pt is alert, follows commands appropriately, not in acute distress Cardiovascular: Regular rate  and rhythm, S1/S2 +, no murmurs Respiratory: Clear to auscultation bilaterally, no wheezing, no  crackles, no rhonchi Abdominal: Soft, non tender, non distended, bowel sounds +, no guarding Extremities: no edema, no cyanosis, pulses palpable bilaterally DP and PT Neuro: Grossly nonfocal  Discharge Instructions  Discharge Instructions    Call MD for:  difficulty breathing, headache or visual disturbances    Complete by:  As directed      Call MD for:  persistant nausea and vomiting    Complete by:  As directed      Call MD for:  severe uncontrolled pain    Complete by:  As directed      Diet - low sodium heart healthy    Complete by:  As directed      Increase activity slowly    Complete by:  As directed             Medication List    TAKE these medications        acetaminophen 325 MG tablet  Commonly known as:  TYLENOL  Take 2 tablets (650 mg total) by mouth every 6 (six) hours as needed for mild pain (or Fever >/= 101).     albuterol 108 (90 BASE) MCG/ACT inhaler  Commonly known as:  PROVENTIL HFA;VENTOLIN HFA  Inhale 1-2 puffs into the lungs 3 (three) times daily as needed for wheezing or shortness of breath (wheezing & shortness of breath).     alum & mag hydroxide-simeth 200-200-20 MG/5ML suspension  Commonly known as:  MAALOX/MYLANTA  Take 30 mLs by mouth every 6 (six) hours as needed for indigestion or heartburn (dyspepsia).     aspirin 325 MG tablet  Take 1 tablet (325 mg total) by mouth daily.     budesonide-formoterol 160-4.5 MCG/ACT inhaler  Commonly known as:  SYMBICORT  Inhale 2 puffs into the lungs daily as needed (shortness of breath).     butalbital-acetaminophen-caffeine 50-325-40 MG per tablet  Commonly known as:  FIORICET, ESGIC  Take 1 tablet by mouth every 8 (eight) hours as needed for headache (headache).     CALCIUM 600-D 600-400 MG-UNIT per tablet  Generic drug:  Calcium Carbonate-Vitamin D  Take 1 tablet by mouth 2 (two) times daily with a meal.     Clotrimazole 1 % Oint  Take 1 application by mouth 2 (two) times daily as needed  (mouth sores).     diclofenac sodium 1 % Gel  Commonly known as:  VOLTAREN  Apply 1 application topically 4 (four) times daily as needed. Inflammation     gabapentin 100 MG capsule  Commonly known as:  NEURONTIN  Take 1 capsule (100 mg total) by mouth 2 (two) times daily.     lactulose 10 GM/15ML solution  Commonly known as:  CHRONULAC  Take 10 g by mouth daily as needed for mild constipation (constipation). 10 ml by mouth once daily at bedtime     metoprolol 50 MG tablet  Commonly known as:  LOPRESSOR  Take 1 tablet (50 mg total) by mouth 2 (two) times daily.     nitroGLYCERIN 0.4 MG SL tablet  Commonly known as:  NITROSTAT  Place 1 tablet (0.4 mg total) under the tongue every 5 (five) minutes as needed for chest pain.     omeprazole 40 MG capsule  Commonly known as:  PRILOSEC  Take 1 capsule (40 mg total) by mouth 2 (two) times daily.     ondansetron 4 MG tablet  Commonly known as:  ZOFRAN  Take 1 tablet (4 mg total) by mouth every 6 (six) hours as needed for nausea.     oxyCODONE-acetaminophen 5-325 MG per tablet  Commonly known as:  PERCOCET/ROXICET  Take 1 tablet by mouth every 6 (six) hours as needed for moderate pain or severe pain (pain).     polyethylene glycol packet  Commonly known as:  MIRALAX / GLYCOLAX  Take 17 g by mouth daily as needed for mild constipation or moderate constipation.     saccharomyces boulardii 250 MG capsule  Commonly known as:  FLORASTOR  Take 1 capsule (250 mg total) by mouth 2 (two) times daily.     vitamin C 500 MG tablet  Commonly known as:  ASCORBIC ACID  Take 500 mg by mouth daily.     VITAMIN E PO  Take 1 tablet by mouth daily.           Follow-up Information    Follow up with [name], NP. Schedule an appointment as soon as possible for a visit in 2 weeks.   Specialty:  Internal Medicine   Why:  Follow up appt after recent hospitalization   Contact information:   [address] [place] [address]  [place] [address] [phone]        The results of significant diagnostics from this hospitalization (including imaging, microbiology, ancillary and laboratory) are listed below for reference.    Significant Diagnostic Studies: Ct Abdomen Pelvis W Contrast  02/28/2014   CLINICAL DATA:  Acute onset of generalized weakness. Abdominal pain, nausea and vomiting. Initial encounter.  EXAM: CT ABDOMEN AND PELVIS WITH CONTRAST  TECHNIQUE: Multidetector CT imaging of the abdomen and pelvis was performed using the standard protocol following bolus administration of intravenous contrast.  CONTRAST:  163mL OMNIPAQUE IOHEXOL 300 MG/ML  SOLN  COMPARISON:  CT of the abdomen and pelvis from 11/14/2013  FINDINGS: Patchy right-sided airspace opacities are compatible with pneumonia.  The liver and spleen are unremarkable in appearance. The patient is status post cholecystectomy, with clips noted along the gallbladder fossa. Pneumobilia likely reflects prior instrumentation at the duodenal ampulla.  Vague stranding about the pancreas is similar in appearance to the stranding about small bowel loops, with fluid tracking about the second segment of the duodenum. This is thought to reflect the acute colonic process described below. Trace associated ascites is seen within the abdomen and pelvis. The adrenal glands are unremarkable in appearance.  Vaguely decreased enhancement is noted at the anterior aspect of the right kidney, of uncertain significance and slightly less apparent on delayed images. Would correlate for evidence of mild focal pyelonephritis. Mild left renal scarring is noted. Scattered small bilateral renal cysts are seen. There is no evidence of hydronephrosis. No renal or ureteral stones are identified.  There is mild edema noted with regard to the first and second segments of the duodenum, though this may be reactive in nature. Remaining visualized small bowel loops are grossly unremarkable. The stomach is within  normal limits. No acute vascular abnormalities are seen. Scattered calcification is seen along the abdominal aorta and its branches. The abdominal aorta is somewhat tortuous in appearance.  The appendix is not definitely seen; there is no evidence of appendicitis.  There is diffuse wall thickening and edema noted along much of the colon, with partial sparing of the ascending colon. Surrounding soft tissue inflammation and trace fluid are seen, particularly along the transverse and sigmoid colon. Findings are compatible with acute colitis, either infectious or inflammatory  in nature. The distribution suggests against ischemia.  A metallic device is noted at the right lower quadrant anterior abdominal wall. An additional thoracic spine simulation device is noted at the left flank, with a lead extending up the thoracic spine.  The bladder is mildly distended. Apparent bladder wall thickening is suspicious for cystitis, as it is new from the prior study. The patient is status post hysterectomy. No suspicious adnexal masses are seen. No inguinal lymphadenopathy is seen.  No acute osseous abnormalities are identified. Right convex thoracolumbar scoliosis is noted. Underlying vacuum phenomenon and endplate sclerotic change are seen.  IMPRESSION: 1. Diffuse acute colitis noted, with partial sparing of the ascending colon, and surrounding soft tissue inflammation and trace fluid. This is either infectious or inflammatory in nature. 2. Bladder wall thickening is suspicious for cystitis, as it is new from the prior study. 3. Vaguely decreased enhancement at the anterior aspect of the right kidney is apparently new from the prior study, and may reflect mild focal pyelonephritis. Would correlate for associated symptoms. 4. Relatively diffuse patchy right-sided pneumonia noted. 5. Trace ascites within the abdomen and pelvis is thought to reflect the colonic process. Mild edema about the small bowel and pancreas are thought to be  reactive. This is most prominent about the first and second segments of the duodenum, of uncertain significance. 6. Pneumobilia likely reflects prior instrumentation at the duodenal ampulla. 7. Scattered small bilateral renal cysts; mild left renal scarring. 8. Scattered calcification along the abdominal aorta and its branches. 9. Right convex thoracolumbar scoliosis, with underlying mild degenerative change.  These results were called by telephone at the time of interpretation on 02/28/2014 at 7:33 pm to Dr. Sherwood Gambler, who verbally acknowledged these results.   Electronically Signed   By: Garald Balding M.D.   On: 02/28/2014 19:37   Dg Chest Port 1 View  02/28/2014   CLINICAL DATA:  Fever, nausea, vomiting  EXAM: PORTABLE CHEST - 1 VIEW  COMPARISON:  Radiograph 11/13/2013  FINDINGS: Normal cardiac silhouette. There is fine bronchitic markings in the lungs. No focal infiltrate. No effusion. No pneumothorax.  IMPRESSION: No acute cardiopulmonary process.   Electronically Signed   By: Suzy Bouchard M.D.   On: 02/28/2014 18:24    Microbiology: Recent Results (from the past 240 hour(s))  Blood Culture (routine x 2)     Status: None   Collection Time: 02/28/14  5:25 PM  Result Value Ref Range Status   Specimen Description BLOOD RIGHT WRIST  Final   Special Requests BOTTLES DRAWN AEROBIC ONLY 3ML  Final   Culture   Final    NO GROWTH 5 DAYS Performed at [facility]    Report Status 03/07/2014 FINAL  Final  Blood Culture (routine x 2)     Status: None   Collection Time: 02/28/14  5:41 PM  Result Value Ref Range Status   Specimen Description BLOOD RAC  Final   Special Requests BOTTLES DRAWN AEROBIC ONLY 3ML  Final   Culture   Final    NO GROWTH 5 DAYS Performed at [facility]    Report Status 03/07/2014 FINAL  Final  Urine culture     Status: None   Collection Time: 02/28/14  5:49 PM  Result Value Ref Range Status   Specimen Description URINE, CATHETERIZED  Final    Special Requests NONE  Final   Colony Count   Final    >=100,000 COLONIES/ML Performed at [facility]  Final    KLEBSIELLA PNEUMONIAE Performed at [facility]    Report Status 03/03/2014 FINAL  Final   Organism ID, Bacteria KLEBSIELLA PNEUMONIAE  Final      Susceptibility   Klebsiella pneumoniae - MIC*    AMPICILLIN >=32 RESISTANT Resistant     CEFAZOLIN <=4 SENSITIVE Sensitive     CEFTRIAXONE <=1 SENSITIVE Sensitive     CIPROFLOXACIN <=0.25 SENSITIVE Sensitive     GENTAMICIN <=1 SENSITIVE Sensitive     LEVOFLOXACIN <=0.12 SENSITIVE Sensitive     NITROFURANTOIN 128 RESISTANT Resistant     TOBRAMYCIN <=1 SENSITIVE Sensitive     TRIMETH/SULFA <=20 SENSITIVE Sensitive     PIP/TAZO <=4 SENSITIVE Sensitive     * KLEBSIELLA PNEUMONIAE  MRSA PCR Screening     Status: None   Collection Time: 02/28/14 10:49 PM  Result Value Ref Range Status   MRSA by PCR NEGATIVE NEGATIVE Final    Comment:        The GeneXpert MRSA Assay (FDA approved for NASAL specimens only), is one component of a comprehensive MRSA colonization surveillance program. It is not intended to diagnose MRSA infection nor to guide or monitor treatment for MRSA infections.   Clostridium Difficile by PCR     Status: None   Collection Time: 03/03/14  8:24 AM  Result Value Ref Range Status   C difficile by pcr NEGATIVE NEGATIVE Final    Comment: Performed at [facility]  Stool culture     Status: None   Collection Time: 03/03/14  9:52 PM  Result Value Ref Range Status   Specimen Description STOOL  Final   Special Requests NONE  Final   Culture   Final    ABUNDANT CANDIDA ALBICANS NO SALMONELLA, SHIGELLA, CAMPYLOBACTER, YERSINIA, OR E.COLI 0157:H7 ISOLATED Performed at [facility]    Report Status 03/07/2014 FINAL  Final     Labs: Basic Metabolic Panel: No results for input(s): NA, K, CL, CO2, GLUCOSE, BUN, CREATININE, CALCIUM, MG, PHOS in the last 168  hours. Liver Function Tests: No results for input(s): AST, ALT, ALKPHOS, BILITOT, PROT, ALBUMIN in the last 168 hours. No results for input(s): LIPASE, AMYLASE in the last 168 hours. No results for input(s): AMMONIA in the last 168 hours. CBC:  Recent Labs Lab 03/05/14 0348 03/06/14 0417 03/07/14 0510 03/08/14 0440 03/09/14 0515  WBC 3.6* 3.9* 4.0 4.3 3.3*  HGB 10.7* 10.4* 10.8* 10.9* 10.9*  HCT 33.0* 33.3* 34.7* 34.5* 35.4*  MCV 96.5 97.4 97.2 96.9 98.1  PLT 123* 144* 175 177 195   Cardiac Enzymes:  Recent Labs Lab 03/03/14 1306 03/03/14 1946 03/04/14 0029  TROPONINI <0.03 <0.03 0.03   BNP: BNP (last 3 results) No results for input(s): BNP in the last 8760 hours.  ProBNP (last 3 results) No results for input(s): PROBNP in the last 8760 hours.  CBG: No results for input(s): GLUCAP in the last 168 hours.  Time coordinating discharge: Over 30 minutes

## 2014-03-09 NOTE — Telephone Encounter (Signed)
Please arrange hospital follow up in 1 week.

## 2014-03-09 NOTE — Progress Notes (Signed)
Pt for discharge to [facility].  CSW facilitated pt discharge needs including contacting facility, faxing pt discharge information via [facility], discussing with pt at bedside and pt daughter via telephone, providing RN phone number to call report, and arranging ambulance transport via [place] for pt to [facility].  Pt expressed anxiousness about transition to [facility]. CSW provided support. RN present at bedside and also provided support. CSW notified pt that pt daughter will meet her at [facility] as pt daughter was planned to go to [place] at 2:30 pm to complete paperwork.   No further social work needs identified at this time.  CSW signing off.   [name] [name], MSW, LCSW Clinical Social Work Coverage for [place], [place]

## 2014-03-09 NOTE — Discharge Instructions (Signed)
Sepsis °Sepsis is a serious infection of your blood or tissues that affects your whole body. The infection that causes sepsis may be bacterial, viral, fungal, or parasitic. Sepsis may be life threatening. Sepsis can cause your blood pressure to drop. This may result in shock. Shock causes your central nervous system and your organs to stop working correctly.  °RISK FACTORS °Sepsis can happen in anyone, but it is more likely to happen in people who have weakened immune systems. °SIGNS AND SYMPTOMS  °Symptoms of sepsis can include: °· Fever or low body temperature (hypothermia). °· Rapid breathing (hyperventilation). °· Chills. °· Rapid heartbeat (tachycardia). °· Confusion or light-headedness. °· Trouble breathing. °· Urinating much less than usual. °· Cool, clammy skin or red, flushed skin. °· Other problems with the heart, kidneys, or brain. °DIAGNOSIS  °Your health care provider will likely do tests to look for an infection, to see if the infection has spread to your blood, and to see how serious your condition is. Tests can include: °· Blood tests, including cultures of your blood. °· Cultures of other fluids from your body, such as: °¨ Urine. °¨ Pus from wounds. °¨ Mucus coughed up from your lungs. °· Urine tests other than cultures. °· X-ray exams or other imaging tests. °TREATMENT  °Treatment will begin with elimination of the source of infection. If your sepsis is likely caused by a bacterial or fungal infection, you will be given antibiotic or antifungal medicines. °You may also receive: °· Oxygen. °· Fluids through an IV tube. °· Medicines to increase your blood pressure. °· A machine to clean your blood (dialysis) if your kidneys fail. °· A machine to help you breathe if your lungs fail. °SEEK IMMEDIATE MEDICAL CARE IF: °You get an infection or develop any of the signs and symptoms of sepsis after surgery or a hospitalization. °Document Released: 10/12/2002 Document Revised: 01/18/2013 Document Reviewed:  09/20/2012 °ExitCare® Patient Information ©2015 [employer]. This information is not intended to replace advice given to you by your health care provider. Make sure you discuss any questions you have with your health care provider. ° °

## 2014-03-09 NOTE — Progress Notes (Signed)
Report called to  nurse Diane at [facility]. Facility. Pt awaiting Transportation.

## 2014-03-09 NOTE — Progress Notes (Signed)
Pt left the unit in stable condition via ambulance to SNF.

## 2014-03-10 ENCOUNTER — Non-Acute Institutional Stay (SKILLED_NURSING_FACILITY): Payer: Medicare Other | Admitting: Adult Health

## 2014-03-10 ENCOUNTER — Encounter: Payer: Self-pay | Admitting: Adult Health

## 2014-03-10 DIAGNOSIS — J189 Pneumonia, unspecified organism: Secondary | ICD-10-CM

## 2014-03-10 DIAGNOSIS — I509 Heart failure, unspecified: Secondary | ICD-10-CM | POA: Diagnosis not present

## 2014-03-10 DIAGNOSIS — K219 Gastro-esophageal reflux disease without esophagitis: Secondary | ICD-10-CM

## 2014-03-10 DIAGNOSIS — G629 Polyneuropathy, unspecified: Secondary | ICD-10-CM | POA: Diagnosis not present

## 2014-03-10 DIAGNOSIS — I10 Essential (primary) hypertension: Secondary | ICD-10-CM

## 2014-03-10 DIAGNOSIS — J439 Emphysema, unspecified: Secondary | ICD-10-CM

## 2014-03-10 DIAGNOSIS — K5190 Ulcerative colitis, unspecified, without complications: Secondary | ICD-10-CM

## 2014-03-10 DIAGNOSIS — R5381 Other malaise: Secondary | ICD-10-CM

## 2014-03-10 DIAGNOSIS — N390 Urinary tract infection, site not specified: Secondary | ICD-10-CM | POA: Diagnosis not present

## 2014-03-10 NOTE — Telephone Encounter (Signed)
Unable to reach patient using numbers listed.  Attempted to call daughter Neoma Laming.  Left message for call back. According to discharge summary, pt is to be placed in SNF.

## 2014-03-10 NOTE — Progress Notes (Signed)
Patient ID: [name] [name], female   DOB: 09/27/1925, 81 y.o.   MRN: [id number]   03/10/2014  Facility:  Nursing Home Location:  [place] Room Number: 105-P LEVEL OF CARE:  SNF (31)  Chief Complaint  Patient presents with  . Hospitalization Follow-up    Physical deconditioning, UTI, CAP, Pancolitis, GERD, hypertension, neuropathy, constipation, COPD and CHF    HISTORY OF PRESENT ILLNESS:  This is an 81 year old female who has been admitted to [facility] on 03/09/14 from [facility]. She has past medical history of COPD, CHF, hypertension and osteoporosis. She presented to ED with nausea and vomiting, hypotension, tachycardia, SOB and fever. WBC was noted to be high, 17.7. She was diagnosed with severe sepsis secondary to multiple infections, pneumonia, UTI and colitis. She was treated with vancomycin and Zosyn. She has been admitted for a short-term rehabilitation.  PAST MEDICAL HISTORY:  Past Medical History  Diagnosis Date  . Osteoporosis   . Arthritis     osteoarthritis  . GERD (gastroesophageal reflux disease)   . Hiatal hernia   . PUD (peptic ulcer disease)   . Cardiac arrhythmia   . Hyperlipidemia   . Heart failure   . Hypertension   . Scoliosis deformity of spine     history of intractable back pain  . Choledocholithiasis   . Pancolitis     Infectious vs. inflammatory  . Abdominal pain     Resolved  . Chronic anemia   . Chronic back pain   . Pancreatitis     History of  . CHF (congestive heart failure)     Diastolic  on ECHO 3500  . COPD (chronic obstructive pulmonary disease)     oxygen dependent at  night 2LPM    CURRENT MEDICATIONS: Reviewed per MAR/see medication list  Allergies  Allergen Reactions  . Codeine     REACTION: n/v/d, HA  . Morphine     REACTION: n/v/d, HA  . Sulfa Antibiotics     Sick    . Zoledronic Acid     REACTION: Severe edema     REVIEW OF SYSTEMS:  GENERAL: no change in appetite, no  fatigue, no weight changes, no fever, chills or weakness RESPIRATORY: no cough, SOB, DOE, wheezing, hemoptysis CARDIAC: no chest pain, or palpitations GI: no abdominal pain, diarrhea, constipation, heart burn, nausea or vomiting  PHYSICAL EXAMINATION  GENERAL: no acute distress, normal body habitus EYES: conjunctivae normal, sclerae normal, normal eye lids NECK: supple, trachea midline, no neck masses, no thyroid tenderness, no thyromegaly LYMPHATICS: no LAN in the neck, no supraclavicular LAN RESPIRATORY: breathing is even & unlabored, BS CTAB CARDIAC: RRR, no murmur,no extra heart sounds, RLE edema 1+ GI: abdomen soft, normal BS, no masses, no tenderness, no hepatomegaly, no splenomegaly EXTREMITIES: able to move 4 extremities PSYCHIATRIC: the patient is alert & oriented to person, affect & behavior appropriate  LABS/RADIOLOGY: Labs reviewed: Basic Metabolic Panel:  Recent Labs  11/14/13 0402  11/16/13 0351 02/28/14 1727 03/01/14 0304  NA 143  < > 142 135 137  K 4.2  < > 4.3 3.5 3.4*  CL 105  < > 104 96 107  CO2 28  < > 31 25 26   GLUCOSE 121*  < > 111* 161* 117*  BUN 11  < > 11 15 16   CREATININE 0.63  < > 0.72 0.72 0.60  CALCIUM 8.3*  < > 8.3* 8.2* 6.7*  MG 2.1  --   --   --   --   < > =  values in this interval not displayed. Liver Function Tests:  Recent Labs  11/13/13 1750 11/14/13 0402 02/28/14 1727  AST 17 16 41*  ALT 12 9 23   ALKPHOS 93 80 100  BILITOT <0.2* <0.2* 0.7  PROT 6.7 5.8* 6.7  ALBUMIN 3.5 2.9* 3.6    Recent Labs  11/14/13 0153 02/28/14 1727  LIPASE 9* 18    CBC:  Recent Labs  11/15/13 0048  11/24/13 1740 02/28/14 1727  03/07/14 0510 03/08/14 0440 03/09/14 0515  WBC 3.6*  < > 4.4 17.7*  < > 4.0 4.3 3.3*  NEUTROABS 2.3  --  2.6 15.3*  --   --   --   --   HGB 10.6*  < > 11.4* 17.9*  < > 10.8* 10.9* 10.9*  HCT 33.3*  < > 34.8* 53.3*  < > 34.7* 34.5* 35.4*  MCV 96.5  < > 93.2 93.8  < > 97.2 96.9 98.1  PLT 151  < > 171.0 129*  < >  175 177 195  < > = values in this interval not displayed.  Cardiac Enzymes:  Recent Labs  03/03/14 1306 03/03/14 1946 03/04/14 0029  TROPONINI <0.03 <0.03 0.03   CBG:  Recent Labs  11/14/13 1442 11/14/13 1734 11/14/13 2230  GLUCAP 86 86 120*    Ct Abdomen Pelvis W Contrast  02/28/2014   CLINICAL DATA:  Acute onset of generalized weakness. Abdominal pain, nausea and vomiting. Initial encounter.  EXAM: CT ABDOMEN AND PELVIS WITH CONTRAST  TECHNIQUE: Multidetector CT imaging of the abdomen and pelvis was performed using the standard protocol following bolus administration of intravenous contrast.  CONTRAST:  134mL OMNIPAQUE IOHEXOL 300 MG/ML  SOLN  COMPARISON:  CT of the abdomen and pelvis from 11/14/2013  FINDINGS: Patchy right-sided airspace opacities are compatible with pneumonia.  The liver and spleen are unremarkable in appearance. The patient is status post cholecystectomy, with clips noted along the gallbladder fossa. Pneumobilia likely reflects prior instrumentation at the duodenal ampulla.  Vague stranding about the pancreas is similar in appearance to the stranding about small bowel loops, with fluid tracking about the second segment of the duodenum. This is thought to reflect the acute colonic process described below. Trace associated ascites is seen within the abdomen and pelvis. The adrenal glands are unremarkable in appearance.  Vaguely decreased enhancement is noted at the anterior aspect of the right kidney, of uncertain significance and slightly less apparent on delayed images. Would correlate for evidence of mild focal pyelonephritis. Mild left renal scarring is noted. Scattered small bilateral renal cysts are seen. There is no evidence of hydronephrosis. No renal or ureteral stones are identified.  There is mild edema noted with regard to the first and second segments of the duodenum, though this may be reactive in nature. Remaining visualized small bowel loops are grossly  unremarkable. The stomach is within normal limits. No acute vascular abnormalities are seen. Scattered calcification is seen along the abdominal aorta and its branches. The abdominal aorta is somewhat tortuous in appearance.  The appendix is not definitely seen; there is no evidence of appendicitis.  There is diffuse wall thickening and edema noted along much of the colon, with partial sparing of the ascending colon. Surrounding soft tissue inflammation and trace fluid are seen, particularly along the transverse and sigmoid colon. Findings are compatible with acute colitis, either infectious or inflammatory in nature. The distribution suggests against ischemia.  A metallic device is noted at the right lower quadrant anterior abdominal wall. An  additional thoracic spine simulation device is noted at the left flank, with a lead extending up the thoracic spine.  The bladder is mildly distended. Apparent bladder wall thickening is suspicious for cystitis, as it is new from the prior study. The patient is status post hysterectomy. No suspicious adnexal masses are seen. No inguinal lymphadenopathy is seen.  No acute osseous abnormalities are identified. Right convex thoracolumbar scoliosis is noted. Underlying vacuum phenomenon and endplate sclerotic change are seen.  IMPRESSION: 1. Diffuse acute colitis noted, with partial sparing of the ascending colon, and surrounding soft tissue inflammation and trace fluid. This is either infectious or inflammatory in nature. 2. Bladder wall thickening is suspicious for cystitis, as it is new from the prior study. 3. Vaguely decreased enhancement at the anterior aspect of the right kidney is apparently new from the prior study, and may reflect mild focal pyelonephritis. Would correlate for associated symptoms. 4. Relatively diffuse patchy right-sided pneumonia noted. 5. Trace ascites within the abdomen and pelvis is thought to reflect the colonic process. Mild edema about the small  bowel and pancreas are thought to be reactive. This is most prominent about the first and second segments of the duodenum, of uncertain significance. 6. Pneumobilia likely reflects prior instrumentation at the duodenal ampulla. 7. Scattered small bilateral renal cysts; mild left renal scarring. 8. Scattered calcification along the abdominal aorta and its branches. 9. Right convex thoracolumbar scoliosis, with underlying mild degenerative change.  These results were called by telephone at the time of interpretation on 02/28/2014 at 7:33 pm to Dr. Sherwood Gambler, who verbally acknowledged these results.   Electronically Signed   By: Garald Balding M.D.   On: 02/28/2014 19:37   Dg Chest Port 1 View  02/28/2014   CLINICAL DATA:  Fever, nausea, vomiting  EXAM: PORTABLE CHEST - 1 VIEW  COMPARISON:  Radiograph 11/13/2013  FINDINGS: Normal cardiac silhouette. There is fine bronchitic markings in the lungs. No focal infiltrate. No effusion. No pneumothorax.  IMPRESSION: No acute cardiopulmonary process.   Electronically Signed   By: Suzy Bouchard M.D.   On: 02/28/2014 18:24    ASSESSMENT/PLAN:  Physical deconditioning - for rehabilitation Pneumonia - treated with vancomycin and Zosyn UTI - treated with Zosyn Pancolitis - continue flora store 250 mg by mouth twice a day GERD - continue omeprazole 40 mg by mouth twice a day Hypertension - well controlled; continue Lopressor 50 mg by mouth twice a day Neuropathy - continue Neurontin 100 mg by mouth twice a day COPD - stable; continue albuterol twice a day when necessary and oxygen 2 L/m via [place] CHF - compensated   Goals of care:  Short-term rehabilitation   Labs/test ordered:     Spent 50 minutes in patient care.     [facility], NP [employer] [phone]

## 2014-03-13 NOTE — Telephone Encounter (Signed)
Caller name: Pendry,[name] Relation to pt: daughter  Call back number: [phone]   Reason for call:  Pt is currently in SNF and stated patient is doing much better. Daughter stated hospital did not advise pt to follow up with PCP. Daughter stated is it necessary.

## 2014-03-14 ENCOUNTER — Other Ambulatory Visit: Payer: Medicare Other

## 2014-03-14 NOTE — Telephone Encounter (Signed)
Spoke with pt's daughter. Pt is tentatively expected to be discharged from SNF on Friday. Pt's daughter wants to get pt home and get her strength up first before she schedules appt. States she will call us back to arrange as soon as pt is able.

## 2014-03-14 NOTE — Telephone Encounter (Signed)
She should follow up after D/C from SNF please.

## 2014-03-16 ENCOUNTER — Telehealth: Payer: Self-pay | Admitting: Critical Care Medicine

## 2014-03-16 NOTE — Telephone Encounter (Signed)
Pt's daughter, [name], returned call. [name] is requesting portable system for pt. Pt does not have any up-to-date sats in chart (last update was in 2012). Appt made on injection schedule for qualifying walk on 03/20/14. Pt has appt with PW on 04/05/2014. [name] stated if she is able to return the pt's tank for a new one then she will cancel the walk on 03/20/14 and keep the PW appt. Nothing further needed at this time.

## 2014-03-16 NOTE — Telephone Encounter (Signed)
lmtcb

## 2014-03-17 ENCOUNTER — Encounter: Payer: Self-pay | Admitting: Adult Health

## 2014-03-17 ENCOUNTER — Non-Acute Institutional Stay (SKILLED_NURSING_FACILITY): Payer: Medicare Other | Admitting: Adult Health

## 2014-03-17 DIAGNOSIS — I10 Essential (primary) hypertension: Secondary | ICD-10-CM | POA: Diagnosis not present

## 2014-03-17 DIAGNOSIS — J439 Emphysema, unspecified: Secondary | ICD-10-CM

## 2014-03-17 DIAGNOSIS — K219 Gastro-esophageal reflux disease without esophagitis: Secondary | ICD-10-CM | POA: Diagnosis not present

## 2014-03-17 DIAGNOSIS — R5381 Other malaise: Secondary | ICD-10-CM

## 2014-03-17 DIAGNOSIS — G629 Polyneuropathy, unspecified: Secondary | ICD-10-CM | POA: Diagnosis not present

## 2014-03-17 DIAGNOSIS — I509 Heart failure, unspecified: Secondary | ICD-10-CM

## 2014-03-17 NOTE — Progress Notes (Signed)
Patient ID: [name] [name], female   DOB: 01-Mar-1925, 81 y.o.   MRN: [id number]   03/17/2014  Facility:  Nursing Home Location:  [place] Room Number: 105-P LEVEL OF CARE:  SNF (31)  Chief Complaint  Patient presents with  . Discharge Note    Physical deconditioning, GERD, Hypertension, Neuropathy, COPD and CHF    HISTORY OF PRESENT ILLNESS:  This is an 81 year old female who is for discharge home with Home health PT, OT and Nursing. She has been admitted to [facility] on 03/09/14 from [facility]. She has past medical history of COPD, CHF, hypertension and osteoporosis. She presented to ED with nausea and vomiting, hypotension, tachycardia, SOB and fever. WBC was noted to be high, 17.7. She was diagnosed with severe sepsis secondary to multiple infections, pneumonia, UTI and colitis. She was treated with vancomycin and Zosyn.   Patient was admitted to this facility for short-term rehabilitation after the patient's recent hospitalization.  Patient has completed SNF rehabilitation and therapy has cleared the patient for discharge.  PAST MEDICAL HISTORY:  Past Medical History  Diagnosis Date  . Osteoporosis   . Arthritis     osteoarthritis  . GERD (gastroesophageal reflux disease)   . Hiatal hernia   . PUD (peptic ulcer disease)   . Cardiac arrhythmia   . Hyperlipidemia   . Heart failure   . Hypertension   . Scoliosis deformity of spine     history of intractable back pain  . Choledocholithiasis   . Pancolitis     Infectious vs. inflammatory  . Abdominal pain     Resolved  . Chronic anemia   . Chronic back pain   . Pancreatitis     History of  . CHF (congestive heart failure)     Diastolic  on ECHO 4098  . COPD (chronic obstructive pulmonary disease)     oxygen dependent at  night 2LPM    CURRENT MEDICATIONS: Reviewed per MAR/see medication list  Allergies  Allergen Reactions  . Codeine     REACTION: n/v/d, HA  . Morphine     REACTION: n/v/d, HA  . Sulfa Antibiotics     Sick    . Zoledronic Acid     REACTION: Severe edema     REVIEW OF SYSTEMS:  GENERAL: no change in appetite, no fatigue, no weight changes, no fever, chills or weakness RESPIRATORY: no cough, SOB, DOE, wheezing, hemoptysis CARDIAC: no chest pain, or palpitations GI: no abdominal pain, diarrhea, constipation, heart burn, nausea or vomiting  PHYSICAL EXAMINATION  GENERAL: no acute distress, normal body habitus NECK: supple, trachea midline, no neck masses, no thyroid tenderness, no thyromegaly LYMPHATICS: no LAN in the neck, no supraclavicular LAN RESPIRATORY: breathing is even & unlabored, BS CTAB CARDIAC: RRR, no murmur,no extra heart sounds, RLE edema 1+ GI: abdomen soft, normal BS, no masses, no tenderness, no hepatomegaly, no splenomegaly EXTREMITIES: able to move 4 extremities PSYCHIATRIC: the patient is alert & oriented to person, affect & behavior appropriate  LABS/RADIOLOGY: Labs reviewed: Basic Metabolic Panel:  Recent Labs  11/14/13 0402  11/16/13 0351 02/28/14 1727 03/01/14 0304  NA 143  < > 142 135 137  K 4.2  < > 4.3 3.5 3.4*  CL 105  < > 104 96 107  CO2 28  < > 31 25 26   GLUCOSE 121*  < > 111* 161* 117*  BUN 11  < > 11 15 16   CREATININE 0.63  < >  0.72 0.72 0.60  CALCIUM 8.3*  < > 8.3* 8.2* 6.7*  MG 2.1  --   --   --   --   < > = values in this interval not displayed. Liver Function Tests:  Recent Labs  11/13/13 1750 11/14/13 0402 02/28/14 1727  AST 17 16 41*  ALT 12 9 23   ALKPHOS 93 80 100  BILITOT <0.2* <0.2* 0.7  PROT 6.7 5.8* 6.7  ALBUMIN 3.5 2.9* 3.6    Recent Labs  11/14/13 0153 02/28/14 1727  LIPASE 9* 18    CBC:  Recent Labs  11/15/13 0048  11/24/13 1740 02/28/14 1727  03/07/14 0510 03/08/14 0440 03/09/14 0515  WBC 3.6*  < > 4.4 17.7*  < > 4.0 4.3 3.3*  NEUTROABS 2.3  --  2.6 15.3*  --   --   --   --   HGB 10.6*  < > 11.4* 17.9*  < > 10.8* 10.9* 10.9*  HCT 33.3*  < >  34.8* 53.3*  < > 34.7* 34.5* 35.4*  MCV 96.5  < > 93.2 93.8  < > 97.2 96.9 98.1  PLT 151  < > 171.0 129*  < > 175 177 195  < > = values in this interval not displayed.  Cardiac Enzymes:  Recent Labs  03/03/14 1306 03/03/14 1946 03/04/14 0029  TROPONINI <0.03 <0.03 0.03   CBG:  Recent Labs  11/14/13 1442 11/14/13 1734 11/14/13 2230  GLUCAP 86 86 120*    Ct Abdomen Pelvis W Contrast  02/28/2014   CLINICAL DATA:  Acute onset of generalized weakness. Abdominal pain, nausea and vomiting. Initial encounter.  EXAM: CT ABDOMEN AND PELVIS WITH CONTRAST  TECHNIQUE: Multidetector CT imaging of the abdomen and pelvis was performed using the standard protocol following bolus administration of intravenous contrast.  CONTRAST:  148mL OMNIPAQUE IOHEXOL 300 MG/ML  SOLN  COMPARISON:  CT of the abdomen and pelvis from 11/14/2013  FINDINGS: Patchy right-sided airspace opacities are compatible with pneumonia.  The liver and spleen are unremarkable in appearance. The patient is status post cholecystectomy, with clips noted along the gallbladder fossa. Pneumobilia likely reflects prior instrumentation at the duodenal ampulla.  Vague stranding about the pancreas is similar in appearance to the stranding about small bowel loops, with fluid tracking about the second segment of the duodenum. This is thought to reflect the acute colonic process described below. Trace associated ascites is seen within the abdomen and pelvis. The adrenal glands are unremarkable in appearance.  Vaguely decreased enhancement is noted at the anterior aspect of the right kidney, of uncertain significance and slightly less apparent on delayed images. Would correlate for evidence of mild focal pyelonephritis. Mild left renal scarring is noted. Scattered small bilateral renal cysts are seen. There is no evidence of hydronephrosis. No renal or ureteral stones are identified.  There is mild edema noted with regard to the first and second segments  of the duodenum, though this may be reactive in nature. Remaining visualized small bowel loops are grossly unremarkable. The stomach is within normal limits. No acute vascular abnormalities are seen. Scattered calcification is seen along the abdominal aorta and its branches. The abdominal aorta is somewhat tortuous in appearance.  The appendix is not definitely seen; there is no evidence of appendicitis.  There is diffuse wall thickening and edema noted along much of the colon, with partial sparing of the ascending colon. Surrounding soft tissue inflammation and trace fluid are seen, particularly along the transverse and sigmoid colon. Findings  are compatible with acute colitis, either infectious or inflammatory in nature. The distribution suggests against ischemia.  A metallic device is noted at the right lower quadrant anterior abdominal wall. An additional thoracic spine simulation device is noted at the left flank, with a lead extending up the thoracic spine.  The bladder is mildly distended. Apparent bladder wall thickening is suspicious for cystitis, as it is new from the prior study. The patient is status post hysterectomy. No suspicious adnexal masses are seen. No inguinal lymphadenopathy is seen.  No acute osseous abnormalities are identified. Right convex thoracolumbar scoliosis is noted. Underlying vacuum phenomenon and endplate sclerotic change are seen.  IMPRESSION: 1. Diffuse acute colitis noted, with partial sparing of the ascending colon, and surrounding soft tissue inflammation and trace fluid. This is either infectious or inflammatory in nature. 2. Bladder wall thickening is suspicious for cystitis, as it is new from the prior study. 3. Vaguely decreased enhancement at the anterior aspect of the right kidney is apparently new from the prior study, and may reflect mild focal pyelonephritis. Would correlate for associated symptoms. 4. Relatively diffuse patchy right-sided pneumonia noted. 5. Trace  ascites within the abdomen and pelvis is thought to reflect the colonic process. Mild edema about the small bowel and pancreas are thought to be reactive. This is most prominent about the first and second segments of the duodenum, of uncertain significance. 6. Pneumobilia likely reflects prior instrumentation at the duodenal ampulla. 7. Scattered small bilateral renal cysts; mild left renal scarring. 8. Scattered calcification along the abdominal aorta and its branches. 9. Right convex thoracolumbar scoliosis, with underlying mild degenerative change.  These results were called by telephone at the time of interpretation on 02/28/2014 at 7:33 pm to Dr. Sherwood Gambler, who verbally acknowledged these results.   Electronically Signed   By: Garald Balding M.D.   On: 02/28/2014 19:37   Dg Chest Port 1 View  02/28/2014   CLINICAL DATA:  Fever, nausea, vomiting  EXAM: PORTABLE CHEST - 1 VIEW  COMPARISON:  Radiograph 11/13/2013  FINDINGS: Normal cardiac silhouette. There is fine bronchitic markings in the lungs. No focal infiltrate. No effusion. No pneumothorax.  IMPRESSION: No acute cardiopulmonary process.   Electronically Signed   By: Suzy Bouchard M.D.   On: 02/28/2014 18:24    ASSESSMENT/PLAN:  Physical deconditioning - for Home health PT, OT and Nursing GERD - continue omeprazole 40 mg by mouth twice a day Hypertension - well controlled; continue Lopressor 50 mg by mouth twice a day Neuropathy - continue Neurontin 100 mg by mouth twice a day COPD - stable; continue albuterol twice a day when necessary  CHF - compensated   I have filled out patient's discharge paperwork and written prescriptions.  Patient will receive home health PT, OT and Nursing.  Total discharge time: Less than 30 minutes  Discharge time involved coordination of the discharge process with Education officer, museum, nursing staff and therapy department. Medical justification for home health services verified.     [facility],  NP [employer] [phone]

## 2014-03-20 ENCOUNTER — Ambulatory Visit: Payer: Medicare Other

## 2014-03-20 ENCOUNTER — Telehealth: Payer: Self-pay | Admitting: Critical Care Medicine

## 2014-03-20 DIAGNOSIS — G8929 Other chronic pain: Secondary | ICD-10-CM | POA: Diagnosis not present

## 2014-03-20 DIAGNOSIS — J449 Chronic obstructive pulmonary disease, unspecified: Secondary | ICD-10-CM

## 2014-03-20 DIAGNOSIS — K5100 Ulcerative (chronic) pancolitis without complications: Secondary | ICD-10-CM | POA: Diagnosis not present

## 2014-03-20 DIAGNOSIS — Z8701 Personal history of pneumonia (recurrent): Secondary | ICD-10-CM | POA: Diagnosis not present

## 2014-03-20 DIAGNOSIS — I509 Heart failure, unspecified: Secondary | ICD-10-CM | POA: Diagnosis not present

## 2014-03-20 DIAGNOSIS — J441 Chronic obstructive pulmonary disease with (acute) exacerbation: Secondary | ICD-10-CM | POA: Diagnosis not present

## 2014-03-20 DIAGNOSIS — Z9981 Dependence on supplemental oxygen: Secondary | ICD-10-CM | POA: Diagnosis not present

## 2014-03-20 DIAGNOSIS — Z87440 Personal history of urinary (tract) infections: Secondary | ICD-10-CM | POA: Diagnosis not present

## 2014-03-20 DIAGNOSIS — M810 Age-related osteoporosis without current pathological fracture: Secondary | ICD-10-CM | POA: Diagnosis not present

## 2014-03-20 DIAGNOSIS — Z87891 Personal history of nicotine dependence: Secondary | ICD-10-CM | POA: Diagnosis not present

## 2014-03-20 NOTE — Telephone Encounter (Signed)
Spoke with DJ ([facility] nurse) Pt is requesting a home fill 02 system and smaller portable 02 tanks.  Last ov: 12/05/13, next ov: 04/12/14.  It looks like pt was scheduled today for a qualifying walk but this was cancelled.    Dr Joya Gaskins are you ok with this order?  Thanks!

## 2014-03-20 NOTE — Telephone Encounter (Signed)
i am ok with this order 

## 2014-03-21 DIAGNOSIS — M545 Low back pain: Secondary | ICD-10-CM | POA: Diagnosis not present

## 2014-03-21 DIAGNOSIS — Z9689 Presence of other specified functional implants: Secondary | ICD-10-CM | POA: Diagnosis not present

## 2014-03-21 DIAGNOSIS — M5136 Other intervertebral disc degeneration, lumbar region: Secondary | ICD-10-CM | POA: Diagnosis not present

## 2014-03-21 NOTE — Telephone Encounter (Signed)
Order has been placed per PW.

## 2014-03-22 ENCOUNTER — Telehealth: Payer: Self-pay | Admitting: Critical Care Medicine

## 2014-03-22 DIAGNOSIS — J441 Chronic obstructive pulmonary disease with (acute) exacerbation: Secondary | ICD-10-CM | POA: Diagnosis not present

## 2014-03-22 DIAGNOSIS — I509 Heart failure, unspecified: Secondary | ICD-10-CM | POA: Diagnosis not present

## 2014-03-22 DIAGNOSIS — M810 Age-related osteoporosis without current pathological fracture: Secondary | ICD-10-CM | POA: Diagnosis not present

## 2014-03-22 DIAGNOSIS — K5100 Ulcerative (chronic) pancolitis without complications: Secondary | ICD-10-CM | POA: Diagnosis not present

## 2014-03-22 DIAGNOSIS — G8929 Other chronic pain: Secondary | ICD-10-CM | POA: Diagnosis not present

## 2014-03-22 DIAGNOSIS — Z8701 Personal history of pneumonia (recurrent): Secondary | ICD-10-CM | POA: Diagnosis not present

## 2014-03-22 NOTE — Telephone Encounter (Signed)
Pt has been scheduled with MW tomorrow morning at 9:15am. [name] [name] is aware and will let pt know of appointment. Advised her to have pt call us if this doesn't work for her.

## 2014-03-22 NOTE — Telephone Encounter (Signed)
Pt needs ov for evaluation

## 2014-03-22 NOTE — Telephone Encounter (Signed)
Spoke with home health nurse. States that pt has been discharged home from assisted living. She is having L sided chest pain. Pain is worse when she coughs or takes a deep breath. Wondering if a chest xray is needed.  [place]- please advise as PW is not available. Thanks.

## 2014-03-23 ENCOUNTER — Ambulatory Visit: Payer: Medicare Other | Admitting: Internal Medicine

## 2014-03-24 DIAGNOSIS — Z8701 Personal history of pneumonia (recurrent): Secondary | ICD-10-CM | POA: Diagnosis not present

## 2014-03-24 DIAGNOSIS — G8929 Other chronic pain: Secondary | ICD-10-CM | POA: Diagnosis not present

## 2014-03-24 DIAGNOSIS — J441 Chronic obstructive pulmonary disease with (acute) exacerbation: Secondary | ICD-10-CM | POA: Diagnosis not present

## 2014-03-24 DIAGNOSIS — K5100 Ulcerative (chronic) pancolitis without complications: Secondary | ICD-10-CM | POA: Diagnosis not present

## 2014-03-24 DIAGNOSIS — M810 Age-related osteoporosis without current pathological fracture: Secondary | ICD-10-CM | POA: Diagnosis not present

## 2014-03-24 DIAGNOSIS — I509 Heart failure, unspecified: Secondary | ICD-10-CM | POA: Diagnosis not present

## 2014-03-26 ENCOUNTER — Emergency Department (HOSPITAL_BASED_OUTPATIENT_CLINIC_OR_DEPARTMENT_OTHER)
Admission: EM | Admit: 2014-03-26 | Discharge: 2014-03-26 | Disposition: A | Discharge status: Home / Self Care | Payer: Medicare Other | Source: Home / Self Care | Attending: Emergency Medicine | Admitting: Emergency Medicine

## 2014-03-26 ENCOUNTER — Emergency Department (HOSPITAL_BASED_OUTPATIENT_CLINIC_OR_DEPARTMENT_OTHER): Payer: Medicare Other

## 2014-03-26 ENCOUNTER — Encounter (HOSPITAL_BASED_OUTPATIENT_CLINIC_OR_DEPARTMENT_OTHER): Payer: Self-pay

## 2014-03-26 DIAGNOSIS — K219 Gastro-esophageal reflux disease without esophagitis: Secondary | ICD-10-CM | POA: Insufficient documentation

## 2014-03-26 DIAGNOSIS — J9621 Acute and chronic respiratory failure with hypoxia: Secondary | ICD-10-CM | POA: Diagnosis not present

## 2014-03-26 DIAGNOSIS — Z862 Personal history of diseases of the blood and blood-forming organs and certain disorders involving the immune mechanism: Secondary | ICD-10-CM | POA: Insufficient documentation

## 2014-03-26 DIAGNOSIS — Z8711 Personal history of peptic ulcer disease: Secondary | ICD-10-CM | POA: Insufficient documentation

## 2014-03-26 DIAGNOSIS — R05 Cough: Secondary | ICD-10-CM | POA: Diagnosis not present

## 2014-03-26 DIAGNOSIS — R0989 Other specified symptoms and signs involving the circulatory and respiratory systems: Secondary | ICD-10-CM | POA: Diagnosis not present

## 2014-03-26 DIAGNOSIS — J40 Bronchitis, not specified as acute or chronic: Secondary | ICD-10-CM | POA: Diagnosis not present

## 2014-03-26 DIAGNOSIS — R5383 Other fatigue: Secondary | ICD-10-CM | POA: Insufficient documentation

## 2014-03-26 DIAGNOSIS — R0602 Shortness of breath: Secondary | ICD-10-CM

## 2014-03-26 DIAGNOSIS — M1990 Unspecified osteoarthritis, unspecified site: Secondary | ICD-10-CM | POA: Insufficient documentation

## 2014-03-26 DIAGNOSIS — Z8701 Personal history of pneumonia (recurrent): Secondary | ICD-10-CM | POA: Insufficient documentation

## 2014-03-26 DIAGNOSIS — E440 Moderate protein-calorie malnutrition: Secondary | ICD-10-CM | POA: Diagnosis not present

## 2014-03-26 DIAGNOSIS — R918 Other nonspecific abnormal finding of lung field: Secondary | ICD-10-CM | POA: Diagnosis not present

## 2014-03-26 DIAGNOSIS — N390 Urinary tract infection, site not specified: Secondary | ICD-10-CM | POA: Insufficient documentation

## 2014-03-26 DIAGNOSIS — D61818 Other pancytopenia: Secondary | ICD-10-CM | POA: Diagnosis not present

## 2014-03-26 DIAGNOSIS — Z87891 Personal history of nicotine dependence: Secondary | ICD-10-CM | POA: Insufficient documentation

## 2014-03-26 DIAGNOSIS — I10 Essential (primary) hypertension: Secondary | ICD-10-CM | POA: Insufficient documentation

## 2014-03-26 DIAGNOSIS — Z7982 Long term (current) use of aspirin: Secondary | ICD-10-CM | POA: Insufficient documentation

## 2014-03-26 DIAGNOSIS — B9689 Other specified bacterial agents as the cause of diseases classified elsewhere: Secondary | ICD-10-CM | POA: Diagnosis not present

## 2014-03-26 DIAGNOSIS — J441 Chronic obstructive pulmonary disease with (acute) exacerbation: Secondary | ICD-10-CM | POA: Diagnosis not present

## 2014-03-26 DIAGNOSIS — Z9981 Dependence on supplemental oxygen: Secondary | ICD-10-CM | POA: Insufficient documentation

## 2014-03-26 DIAGNOSIS — Z79899 Other long term (current) drug therapy: Secondary | ICD-10-CM | POA: Insufficient documentation

## 2014-03-26 DIAGNOSIS — I5032 Chronic diastolic (congestive) heart failure: Secondary | ICD-10-CM | POA: Diagnosis not present

## 2014-03-26 DIAGNOSIS — G8929 Other chronic pain: Secondary | ICD-10-CM | POA: Insufficient documentation

## 2014-03-26 DIAGNOSIS — E780 Pure hypercholesterolemia: Secondary | ICD-10-CM | POA: Diagnosis not present

## 2014-03-26 LAB — COMPREHENSIVE METABOLIC PANEL
ALT: 11 U/L (ref 0–35)
AST: 20 U/L (ref 0–37)
Albumin: 3.3 g/dL — ABNORMAL LOW (ref 3.5–5.2)
Alkaline Phosphatase: 82 U/L (ref 39–117)
Anion gap: 9 (ref 5–15)
BUN: 11 mg/dL (ref 6–23)
CO2: 29 mmol/L (ref 19–32)
Calcium: 8.5 mg/dL (ref 8.4–10.5)
Chloride: 101 mmol/L (ref 96–112)
Creatinine, Ser: 0.67 mg/dL (ref 0.50–1.10)
GFR calc Af Amer: 90 mL/min — ABNORMAL LOW (ref >90)
GFR calc non Af Amer: 77 mL/min — ABNORMAL LOW (ref >90)
Glucose, Bld: 128 mg/dL — ABNORMAL HIGH (ref 70–99)
Potassium: 4 mmol/L (ref 3.5–5.1)
Sodium: 139 mmol/L (ref 135–145)
Total Bilirubin: 0.4 mg/dL (ref 0.3–1.2)
Total Protein: 6.4 g/dL (ref 6.0–8.3)

## 2014-03-26 LAB — URINALYSIS, ROUTINE W REFLEX MICROSCOPIC
Bilirubin Urine: NEGATIVE
Glucose, UA: NEGATIVE mg/dL
Hgb urine dipstick: NEGATIVE
Ketones, ur: NEGATIVE mg/dL
Nitrite: NEGATIVE
Protein, ur: NEGATIVE mg/dL
Specific Gravity, Urine: 1.007 (ref 1.005–1.030)
Urobilinogen, UA: 0.2 mg/dL (ref 0.0–1.0)
pH: 6 (ref 5.0–8.0)

## 2014-03-26 LAB — CBC WITH DIFFERENTIAL/PLATELET
Basophils Absolute: 0 10*3/uL (ref 0.0–0.1)
Basophils Relative: 1 % (ref 0–1)
Eosinophils Absolute: 0 10*3/uL (ref 0.0–0.7)
Eosinophils Relative: 1 % (ref 0–5)
HCT: 35.5 % — ABNORMAL LOW (ref 36.0–46.0)
Hemoglobin: 11.3 g/dL — ABNORMAL LOW (ref 12.0–15.0)
Lymphocytes Relative: 15 % (ref 12–46)
Lymphs Abs: 0.7 10*3/uL (ref 0.7–4.0)
MCH: 30.6 pg (ref 26.0–34.0)
MCHC: 31.8 g/dL (ref 30.0–36.0)
MCV: 96.2 fL (ref 78.0–100.0)
Monocytes Absolute: 0.6 10*3/uL (ref 0.1–1.0)
Monocytes Relative: 13 % — ABNORMAL HIGH (ref 3–12)
Neutro Abs: 3.1 10*3/uL (ref 1.7–7.7)
Neutrophils Relative %: 71 % (ref 43–77)
Platelets: 119 10*3/uL — ABNORMAL LOW (ref 150–400)
RBC: 3.69 MIL/uL — ABNORMAL LOW (ref 3.87–5.11)
RDW: 12.8 % (ref 11.5–15.5)
WBC: 4.4 10*3/uL (ref 4.0–10.5)

## 2014-03-26 LAB — TROPONIN I: Troponin I: <0.03 ng/mL (ref <0.031)

## 2014-03-26 LAB — URINE MICROSCOPIC-ADD ON

## 2014-03-26 LAB — BRAIN NATRIURETIC PEPTIDE: B Natriuretic Peptide: 101.3 pg/mL — ABNORMAL HIGH (ref 0.0–100.0)

## 2014-03-26 LAB — I-STAT CG4 LACTIC ACID, ED: Lactic Acid, Venous: 0.71 mmol/L (ref 0.5–2.0)

## 2014-03-26 MED ORDER — ALBUTEROL SULFATE (2.5 MG/3ML) 0.083% IN NEBU
5.0000 mg | INHALATION_SOLUTION | Freq: Once | RESPIRATORY_TRACT | Status: AC
  Administered 2014-03-26: 5 mg via RESPIRATORY_TRACT
  Filled 2014-03-26: qty 6

## 2014-03-26 MED ORDER — LEVOFLOXACIN 750 MG PO TABS: 750.0000 mg | ORAL_TABLET | Freq: Every day | ORAL | Status: DC

## 2014-03-26 MED ORDER — LEVOFLOXACIN 750 MG PO TABS
ORAL_TABLET | ORAL | Status: AC
Start: 2014-03-26 — End: 2014-03-26
  Administered 2014-03-26: 750 mg via ORAL
  Filled 2014-03-26: qty 1

## 2014-03-26 MED ORDER — ALBUTEROL SULFATE HFA 108 (90 BASE) MCG/ACT IN AERS
2.0000 | INHALATION_SPRAY | RESPIRATORY_TRACT | Status: DC | PRN
  Administered 2014-03-26: 2 via RESPIRATORY_TRACT
  Filled 2014-03-26: qty 6.7

## 2014-03-26 MED ORDER — LEVOFLOXACIN 750 MG PO TABS
750.0000 mg | ORAL_TABLET | Freq: Once | ORAL | Status: AC
  Administered 2014-03-26: 750 mg via ORAL

## 2014-03-26 MED ORDER — ACETAMINOPHEN 325 MG PO TABS
650.0000 mg | ORAL_TABLET | Freq: Once | ORAL | Status: AC
  Administered 2014-03-26: 650 mg via ORAL
  Filled 2014-03-26: qty 2

## 2014-03-26 NOTE — ED Notes (Signed)
Recent d/c home from hospital- c/o feeling worse x 1 day- has been using home O2 around the clock since discharge home

## 2014-03-26 NOTE — ED Notes (Signed)
Patient here with ongoing congestion and shortness of breath since last pm, recently discharged from hospital with pneumonia, uti and septic shock. Arrived on oxygen at 1l

## 2014-03-26 NOTE — ED Notes (Signed)
MD at bedside. 
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
## 2014-03-26 NOTE — ED Notes (Signed)
MDI and spacer given to patient and her daughter to take home. Not needed at this time, Stated she had taken this in the past and had no questions.

## 2014-03-26 NOTE — Discharge Instructions (Signed)
Urinary Tract Infection Urinary tract infections (UTIs) can develop anywhere along your urinary tract. Your urinary tract is your body's drainage system for removing wastes and extra water. Your urinary tract includes two kidneys, two ureters, a bladder, and a urethra. Your kidneys are a pair of bean-shaped organs. Each kidney is about the size of your fist. They are located below your ribs, one on each side of your spine. CAUSES Infections are caused by microbes, which are microscopic organisms, including fungi, viruses, and bacteria. These organisms are so small that they can only be seen through a microscope. Bacteria are the microbes that most commonly cause UTIs. SYMPTOMS  Symptoms of UTIs may vary by age and gender of the patient and by the location of the infection. Symptoms in young women typically include a frequent and intense urge to urinate and a painful, burning feeling in the bladder or urethra during urination. Older women and men are more likely to be tired, shaky, and weak and have muscle aches and abdominal pain. A fever may mean the infection is in your kidneys. Other symptoms of a kidney infection include pain in your back or sides below the ribs, nausea, and vomiting. DIAGNOSIS To diagnose a UTI, your caregiver will ask you about your symptoms. Your caregiver also will ask to provide a urine sample. The urine sample will be tested for bacteria and white blood cells. White blood cells are made by your body to help fight infection. TREATMENT  Typically, UTIs can be treated with medication. Because most UTIs are caused by a bacterial infection, they usually can be treated with the use of antibiotics. The choice of antibiotic and length of treatment depend on your symptoms and the type of bacteria causing your infection. HOME CARE INSTRUCTIONS  If you were prescribed antibiotics, take them exactly as your caregiver instructs you. Finish the medication even if you feel better after you  have only taken some of the medication.  Drink enough water and fluids to keep your urine clear or pale yellow.  Avoid caffeine, tea, and carbonated beverages. They tend to irritate your bladder.  Empty your bladder often. Avoid holding urine for long periods of time.  Empty your bladder before and after sexual intercourse.  After a bowel movement, women should cleanse from front to back. Use each tissue only once. SEEK MEDICAL CARE IF:   You have back pain.  You develop a fever.  Your symptoms do not begin to resolve within 3 days. SEEK IMMEDIATE MEDICAL CARE IF:   You have severe back pain or lower abdominal pain.  You develop chills.  You have nausea or vomiting.  You have continued burning or discomfort with urination. MAKE SURE YOU:   Understand these instructions.  Will watch your condition.  Will get help right away if you are not doing well or get worse. Document Released: 10/23/2004 Document Revised: 07/15/2011 Document Reviewed: 02/21/2011 [facility] Patient Information 2015 [place], [place]. This information is not intended to replace advice given to you by your health care provider. Make sure you discuss any questions you have with your health care provider.  Upper Respiratory Infection, Adult An upper respiratory infection (URI) is also sometimes known as the common cold. The upper respiratory tract includes the nose, sinuses, throat, trachea, and bronchi. Bronchi are the airways leading to the lungs. Most people improve within 1 week, but symptoms can last up to 2 weeks. A residual cough may last even longer.  CAUSES Many different viruses can infect the  tissues lining the upper respiratory tract. The tissues become irritated and inflamed and often become very moist. Mucus production is also common. A cold is contagious. You can easily spread the virus to others by oral contact. This includes kissing, sharing a glass, coughing, or sneezing. Touching your mouth or  nose and then touching a surface, which is then touched by another person, can also spread the virus. SYMPTOMS  Symptoms typically develop 1 to 3 days after you come in contact with a cold virus. Symptoms vary from person to person. They may include:  Runny nose.  Sneezing.  Nasal congestion.  Sinus irritation.  Sore throat.  Loss of voice (laryngitis).  Cough.  Fatigue.  Muscle aches.  Loss of appetite.  Headache.  Low-grade fever. DIAGNOSIS  You might diagnose your own cold based on familiar symptoms, since most people get a cold 2 to 3 times a year. Your caregiver can confirm this based on your exam. Most importantly, your caregiver can check that your symptoms are not due to another disease such as strep throat, sinusitis, pneumonia, asthma, or epiglottitis. Blood tests, throat tests, and X-rays are not necessary to diagnose a common cold, but they may sometimes be helpful in excluding other more serious diseases. Your caregiver will decide if any further tests are required. RISKS AND COMPLICATIONS  You may be at risk for a more severe case of the common cold if you smoke cigarettes, have chronic heart disease (such as heart failure) or lung disease (such as asthma), or if you have a weakened immune system. The very young and very old are also at risk for more serious infections. Bacterial sinusitis, middle ear infections, and bacterial pneumonia can complicate the common cold. The common cold can worsen asthma and chronic obstructive pulmonary disease (COPD). Sometimes, these complications can require emergency medical care and may be life-threatening. PREVENTION  The best way to protect against getting a cold is to practice good hygiene. Avoid oral or hand contact with people with cold symptoms. Wash your hands often if contact occurs. There is no clear evidence that vitamin C, vitamin E, echinacea, or exercise reduces the chance of developing a cold. However, it is always  recommended to get plenty of rest and practice good nutrition. TREATMENT  Treatment is directed at relieving symptoms. There is no cure. Antibiotics are not effective, because the infection is caused by a virus, not by bacteria. Treatment may include:  Increased fluid intake. Sports drinks offer valuable electrolytes, sugars, and fluids.  Breathing heated mist or steam (vaporizer or shower).  Eating chicken soup or other clear broths, and maintaining good nutrition.  Getting plenty of rest.  Using gargles or lozenges for comfort.  Controlling fevers with ibuprofen or acetaminophen as directed by your caregiver.  Increasing usage of your inhaler if you have asthma. Zinc gel and zinc lozenges, taken in the first 24 hours of the common cold, can shorten the duration and lessen the severity of symptoms. Pain medicines may help with fever, muscle aches, and throat pain. A variety of non-prescription medicines are available to treat congestion and runny nose. Your caregiver can make recommendations and may suggest nasal or lung inhalers for other symptoms.  HOME CARE INSTRUCTIONS   Only take over-the-counter or prescription medicines for pain, discomfort, or fever as directed by your caregiver.  Use a warm mist humidifier or inhale steam from a shower to increase air moisture. This may keep secretions moist and make it easier to breathe.  Drink enough  water and fluids to keep your urine clear or pale yellow.  Rest as needed.  Return to work when your temperature has returned to normal or as your caregiver advises. You may need to stay home longer to avoid infecting others. You can also use a face mask and careful hand washing to prevent spread of the virus. SEEK MEDICAL CARE IF:   After the first few days, you feel you are getting worse rather than better.  You need your caregiver's advice about medicines to control symptoms.  You develop chills, worsening shortness of breath, or brown  or red sputum. These may be signs of pneumonia.  You develop yellow or brown nasal discharge or pain in the face, especially when you bend forward. These may be signs of sinusitis.  You develop a fever, swollen neck glands, pain with swallowing, or white areas in the back of your throat. These may be signs of strep throat. SEEK IMMEDIATE MEDICAL CARE IF:   You have a fever.  You develop severe or persistent headache, ear pain, sinus pain, or chest pain.  You develop wheezing, a prolonged cough, cough up blood, or have a change in your usual mucus (if you have chronic lung disease).  You develop sore muscles or a stiff neck. Document Released: 07/09/2000 Document Revised: 04/07/2011 Document Reviewed: 04/20/2013 [facility] Patient Information 2015 [place], [place]. This information is not intended to replace advice given to you by your health care provider. Make sure you discuss any questions you have with your health care provider.

## 2014-03-28 ENCOUNTER — Emergency Department (HOSPITAL_COMMUNITY): Payer: Medicare Other

## 2014-03-28 ENCOUNTER — Inpatient Hospital Stay (HOSPITAL_COMMUNITY)
Admission: EM | Admit: 2014-03-28 | Discharge: 2014-03-30 | DRG: 190 | Disposition: A | Discharge status: Home Health | Payer: Medicare Other | Attending: Internal Medicine | Admitting: Internal Medicine

## 2014-03-28 ENCOUNTER — Encounter (HOSPITAL_COMMUNITY): Payer: Self-pay | Admitting: Physical Medicine and Rehabilitation

## 2014-03-28 ENCOUNTER — Other Ambulatory Visit (HOSPITAL_COMMUNITY): Payer: Self-pay

## 2014-03-28 ENCOUNTER — Ambulatory Visit: Payer: Medicare Other | Admitting: Physician Assistant

## 2014-03-28 DIAGNOSIS — I10 Essential (primary) hypertension: Secondary | ICD-10-CM | POA: Diagnosis present

## 2014-03-28 DIAGNOSIS — Z9981 Dependence on supplemental oxygen: Secondary | ICD-10-CM

## 2014-03-28 DIAGNOSIS — I5032 Chronic diastolic (congestive) heart failure: Secondary | ICD-10-CM

## 2014-03-28 DIAGNOSIS — J439 Emphysema, unspecified: Secondary | ICD-10-CM | POA: Diagnosis not present

## 2014-03-28 DIAGNOSIS — J441 Chronic obstructive pulmonary disease with (acute) exacerbation: Secondary | ICD-10-CM | POA: Diagnosis not present

## 2014-03-28 DIAGNOSIS — K279 Peptic ulcer, site unspecified, unspecified as acute or chronic, without hemorrhage or perforation: Secondary | ICD-10-CM | POA: Diagnosis present

## 2014-03-28 DIAGNOSIS — E7800 Pure hypercholesterolemia, unspecified: Secondary | ICD-10-CM | POA: Diagnosis present

## 2014-03-28 DIAGNOSIS — Z79899 Other long term (current) drug therapy: Secondary | ICD-10-CM

## 2014-03-28 DIAGNOSIS — E785 Hyperlipidemia, unspecified: Secondary | ICD-10-CM | POA: Diagnosis present

## 2014-03-28 DIAGNOSIS — Z8249 Family history of ischemic heart disease and other diseases of the circulatory system: Secondary | ICD-10-CM | POA: Diagnosis not present

## 2014-03-28 DIAGNOSIS — G8929 Other chronic pain: Secondary | ICD-10-CM | POA: Diagnosis present

## 2014-03-28 DIAGNOSIS — Z7982 Long term (current) use of aspirin: Secondary | ICD-10-CM | POA: Diagnosis not present

## 2014-03-28 DIAGNOSIS — J9621 Acute and chronic respiratory failure with hypoxia: Secondary | ICD-10-CM | POA: Insufficient documentation

## 2014-03-28 DIAGNOSIS — Z87891 Personal history of nicotine dependence: Secondary | ICD-10-CM | POA: Diagnosis not present

## 2014-03-28 DIAGNOSIS — D61818 Other pancytopenia: Secondary | ICD-10-CM | POA: Diagnosis present

## 2014-03-28 DIAGNOSIS — E440 Moderate protein-calorie malnutrition: Secondary | ICD-10-CM | POA: Insufficient documentation

## 2014-03-28 DIAGNOSIS — Z7951 Long term (current) use of inhaled steroids: Secondary | ICD-10-CM

## 2014-03-28 DIAGNOSIS — J432 Centrilobular emphysema: Secondary | ICD-10-CM | POA: Diagnosis not present

## 2014-03-28 DIAGNOSIS — K449 Diaphragmatic hernia without obstruction or gangrene: Secondary | ICD-10-CM | POA: Diagnosis present

## 2014-03-28 DIAGNOSIS — R0602 Shortness of breath: Secondary | ICD-10-CM | POA: Diagnosis not present

## 2014-03-28 DIAGNOSIS — M549 Dorsalgia, unspecified: Secondary | ICD-10-CM | POA: Diagnosis present

## 2014-03-28 DIAGNOSIS — I509 Heart failure, unspecified: Secondary | ICD-10-CM | POA: Diagnosis not present

## 2014-03-28 DIAGNOSIS — Z8711 Personal history of peptic ulcer disease: Secondary | ICD-10-CM | POA: Diagnosis not present

## 2014-03-28 DIAGNOSIS — D649 Anemia, unspecified: Secondary | ICD-10-CM | POA: Insufficient documentation

## 2014-03-28 DIAGNOSIS — R05 Cough: Secondary | ICD-10-CM | POA: Diagnosis not present

## 2014-03-28 DIAGNOSIS — K219 Gastro-esophageal reflux disease without esophagitis: Secondary | ICD-10-CM | POA: Diagnosis present

## 2014-03-28 DIAGNOSIS — J438 Other emphysema: Secondary | ICD-10-CM | POA: Diagnosis not present

## 2014-03-28 DIAGNOSIS — J189 Pneumonia, unspecified organism: Secondary | ICD-10-CM | POA: Diagnosis present

## 2014-03-28 DIAGNOSIS — M810 Age-related osteoporosis without current pathological fracture: Secondary | ICD-10-CM | POA: Diagnosis not present

## 2014-03-28 DIAGNOSIS — R918 Other nonspecific abnormal finding of lung field: Secondary | ICD-10-CM | POA: Diagnosis not present

## 2014-03-28 HISTORY — DX: Other specified symptoms and signs involving the circulatory and respiratory systems: R09.89

## 2014-03-28 HISTORY — DX: Headache, unspecified: R51.9

## 2014-03-28 HISTORY — DX: Dependence on supplemental oxygen: Z99.81

## 2014-03-28 HISTORY — DX: Headache: R51

## 2014-03-28 HISTORY — DX: Pneumonia, unspecified organism: J18.9

## 2014-03-28 HISTORY — DX: Unspecified osteoarthritis, unspecified site: M19.90

## 2014-03-28 HISTORY — DX: Other specified postprocedural states: Z98.890

## 2014-03-28 HISTORY — DX: Nausea with vomiting, unspecified: R11.2

## 2014-03-28 HISTORY — DX: Urinary tract infection, site not specified: N39.0

## 2014-03-28 HISTORY — DX: Respiratory disorder, unspecified: J98.9

## 2014-03-28 LAB — BASIC METABOLIC PANEL
Anion gap: 8 (ref 5–15)
BUN: 8 mg/dL (ref 6–23)
CO2: 30 mmol/L (ref 19–32)
Calcium: 8.3 mg/dL — ABNORMAL LOW (ref 8.4–10.5)
Chloride: 101 mmol/L (ref 96–112)
Creatinine, Ser: 0.75 mg/dL (ref 0.50–1.10)
GFR calc Af Amer: 86 mL/min — ABNORMAL LOW (ref >90)
GFR calc non Af Amer: 75 mL/min — ABNORMAL LOW (ref >90)
Glucose, Bld: 104 mg/dL — ABNORMAL HIGH (ref 70–99)
Potassium: 4 mmol/L (ref 3.5–5.1)
Sodium: 139 mmol/L (ref 135–145)

## 2014-03-28 LAB — CBC WITH DIFFERENTIAL/PLATELET
Basophils Absolute: 0 10*3/uL (ref 0.0–0.1)
Basophils Relative: 0 % (ref 0–1)
Eosinophils Absolute: 0 10*3/uL (ref 0.0–0.7)
Eosinophils Relative: 1 % (ref 0–5)
HCT: 36.1 % (ref 36.0–46.0)
Hemoglobin: 11.7 g/dL — ABNORMAL LOW (ref 12.0–15.0)
Lymphocytes Relative: 11 % — ABNORMAL LOW (ref 12–46)
Lymphs Abs: 0.4 10*3/uL — ABNORMAL LOW (ref 0.7–4.0)
MCH: 30.8 pg (ref 26.0–34.0)
MCHC: 32.4 g/dL (ref 30.0–36.0)
MCV: 95 fL (ref 78.0–100.0)
Monocytes Absolute: 0.4 10*3/uL (ref 0.1–1.0)
Monocytes Relative: 11 % (ref 3–12)
Neutro Abs: 2.9 10*3/uL (ref 1.7–7.7)
Neutrophils Relative %: 77 % (ref 43–77)
Platelets: 120 10*3/uL — ABNORMAL LOW (ref 150–400)
RBC: 3.8 MIL/uL — ABNORMAL LOW (ref 3.87–5.11)
RDW: 13.2 % (ref 11.5–15.5)
WBC: 3.8 10*3/uL — ABNORMAL LOW (ref 4.0–10.5)

## 2014-03-28 LAB — TROPONIN I: Troponin I: <0.03 ng/mL (ref <0.031)

## 2014-03-28 LAB — I-STAT TROPONIN, ED: Troponin i, poc: 0 ng/mL (ref 0.00–0.08)

## 2014-03-28 LAB — I-STAT CG4 LACTIC ACID, ED
Lactic Acid, Venous: 0.5 mmol/L (ref 0.5–2.0)
Lactic Acid, Venous: 0.5 mmol/L (ref 0.5–2.0)

## 2014-03-28 LAB — BRAIN NATRIURETIC PEPTIDE: B Natriuretic Peptide: 83.2 pg/mL (ref 0.0–100.0)

## 2014-03-28 LAB — PROCALCITONIN: Procalcitonin: <0.1 ng/mL

## 2014-03-28 MED ORDER — DOCUSATE SODIUM 100 MG PO CAPS
100.0000 mg | ORAL_CAPSULE | Freq: Two times a day (BID) | ORAL | Status: DC
  Administered 2014-03-28 – 2014-03-30 (×4): 100 mg via ORAL
  Filled 2014-03-28 (×6): qty 1

## 2014-03-28 MED ORDER — NITROGLYCERIN 0.4 MG SL SUBL: 0.4000 mg | SUBLINGUAL_TABLET | SUBLINGUAL | Status: DC | PRN

## 2014-03-28 MED ORDER — ARFORMOTEROL TARTRATE 15 MCG/2ML IN NEBU
15.0000 ug | INHALATION_SOLUTION | Freq: Two times a day (BID) | RESPIRATORY_TRACT | Status: DC
  Administered 2014-03-29 – 2014-03-30 (×3): 15 ug via RESPIRATORY_TRACT
  Filled 2014-03-28 (×7): qty 2

## 2014-03-28 MED ORDER — FAMOTIDINE 20 MG PO TABS
20.0000 mg | ORAL_TABLET | Freq: Two times a day (BID) | ORAL | Status: DC
  Administered 2014-03-28 – 2014-03-30 (×4): 20 mg via ORAL
  Filled 2014-03-28 (×5): qty 1

## 2014-03-28 MED ORDER — POLYETHYLENE GLYCOL 3350 17 G PO PACK
17.0000 g | PACK | Freq: Every day | ORAL | Status: DC | PRN
  Filled 2014-03-28: qty 1

## 2014-03-28 MED ORDER — BUTALBITAL-APAP-CAFFEINE 50-325-40 MG PO TABS
1.0000 | ORAL_TABLET | Freq: Once | ORAL | Status: AC
  Administered 2014-03-28: 1 via ORAL
  Filled 2014-03-28: qty 1

## 2014-03-28 MED ORDER — SODIUM CHLORIDE 0.9 % IV SOLN
INTRAVENOUS | Status: DC
  Administered 2014-03-28: 1000 mL via INTRAVENOUS

## 2014-03-28 MED ORDER — ENOXAPARIN SODIUM 40 MG/0.4ML ~~LOC~~ SOLN
40.0000 mg | SUBCUTANEOUS | Status: DC
  Administered 2014-03-29: 40 mg via SUBCUTANEOUS
  Filled 2014-03-28 (×3): qty 0.4

## 2014-03-28 MED ORDER — VITAMIN C 500 MG PO TABS
500.0000 mg | ORAL_TABLET | Freq: Every day | ORAL | Status: DC
  Administered 2014-03-28 – 2014-03-30 (×3): 500 mg via ORAL
  Filled 2014-03-28 (×3): qty 1

## 2014-03-28 MED ORDER — ACETAMINOPHEN 325 MG PO TABS
650.0000 mg | ORAL_TABLET | Freq: Once | ORAL | Status: AC
  Administered 2014-03-28: 650 mg via ORAL
  Filled 2014-03-28: qty 2

## 2014-03-28 MED ORDER — CALCIUM CARBONATE-VITAMIN D 500-200 MG-UNIT PO TABS
2.0000 | ORAL_TABLET | Freq: Two times a day (BID) | ORAL | Status: DC
  Administered 2014-03-29 – 2014-03-30 (×3): 2 via ORAL
  Filled 2014-03-28 (×5): qty 2

## 2014-03-28 MED ORDER — BUDESONIDE 0.25 MG/2ML IN SUSP
0.5000 mg | Freq: Two times a day (BID) | RESPIRATORY_TRACT | Status: DC
  Administered 2014-03-28: 0.5 mg via RESPIRATORY_TRACT
  Filled 2014-03-28 (×5): qty 4

## 2014-03-28 MED ORDER — ACETAMINOPHEN 325 MG PO TABS
650.0000 mg | ORAL_TABLET | Freq: Four times a day (QID) | ORAL | Status: DC | PRN
  Administered 2014-03-29 (×2): 650 mg via ORAL
  Filled 2014-03-28 (×3): qty 2

## 2014-03-28 MED ORDER — ONDANSETRON HCL 4 MG PO TABS
4.0000 mg | ORAL_TABLET | Freq: Four times a day (QID) | ORAL | Status: DC | PRN
Start: 2014-03-28 — End: 2014-03-28

## 2014-03-28 MED ORDER — SACCHAROMYCES BOULARDII 250 MG PO CAPS
250.0000 mg | ORAL_CAPSULE | Freq: Two times a day (BID) | ORAL | Status: DC
  Administered 2014-03-28 – 2014-03-30 (×4): 250 mg via ORAL
  Filled 2014-03-28 (×5): qty 1

## 2014-03-28 MED ORDER — VANCOMYCIN HCL IN DEXTROSE 1-5 GM/200ML-% IV SOLN
1000.0000 mg | INTRAVENOUS | Status: AC
  Administered 2014-03-28: 1000 mg via INTRAVENOUS
  Filled 2014-03-28: qty 200

## 2014-03-28 MED ORDER — ALBUTEROL SULFATE (2.5 MG/3ML) 0.083% IN NEBU: 3.0000 mL | INHALATION_SOLUTION | RESPIRATORY_TRACT | Status: DC | PRN

## 2014-03-28 MED ORDER — OXYCODONE-ACETAMINOPHEN 5-325 MG PO TABS
1.0000 | ORAL_TABLET | Freq: Two times a day (BID) | ORAL | Status: DC
  Administered 2014-03-28 – 2014-03-30 (×4): 1 via ORAL
  Filled 2014-03-28 (×4): qty 1

## 2014-03-28 MED ORDER — ALBUTEROL SULFATE (2.5 MG/3ML) 0.083% IN NEBU
2.5000 mg | INHALATION_SOLUTION | Freq: Once | RESPIRATORY_TRACT | Status: AC
  Administered 2014-03-28: 2.5 mg via RESPIRATORY_TRACT
  Filled 2014-03-28: qty 3

## 2014-03-28 MED ORDER — METOPROLOL TARTRATE 50 MG PO TABS
50.0000 mg | ORAL_TABLET | Freq: Two times a day (BID) | ORAL | Status: DC
  Administered 2014-03-28 – 2014-03-30 (×4): 50 mg via ORAL
  Filled 2014-03-28 (×5): qty 1

## 2014-03-28 MED ORDER — LACTULOSE 10 GM/15ML PO SOLN
10.0000 g | Freq: Every day | ORAL | Status: DC | PRN
Start: 2014-03-28 — End: 2014-03-30
  Filled 2014-03-28: qty 15

## 2014-03-28 MED ORDER — ALUM & MAG HYDROXIDE-SIMETH 200-200-20 MG/5ML PO SUSP: 30.0000 mL | Freq: Four times a day (QID) | ORAL | Status: DC | PRN

## 2014-03-28 MED ORDER — ALBUTEROL SULFATE (2.5 MG/3ML) 0.083% IN NEBU: 3.0000 mL | INHALATION_SOLUTION | Freq: Three times a day (TID) | RESPIRATORY_TRACT | Status: DC | PRN

## 2014-03-28 MED ORDER — ASPIRIN 325 MG PO TABS
325.0000 mg | ORAL_TABLET | Freq: Every day | ORAL | Status: DC
  Administered 2014-03-28 – 2014-03-30 (×3): 325 mg via ORAL
  Filled 2014-03-28 (×3): qty 1

## 2014-03-28 MED ORDER — SODIUM CHLORIDE 0.9 % IJ SOLN
3.0000 mL | Freq: Two times a day (BID) | INTRAMUSCULAR | Status: DC
  Administered 2014-03-28 – 2014-03-30 (×3): 3 mL via INTRAVENOUS

## 2014-03-28 MED ORDER — SODIUM CHLORIDE 0.9 % IV SOLN
INTRAVENOUS | Status: AC
  Administered 2014-03-29: 06:00:00 via INTRAVENOUS

## 2014-03-28 MED ORDER — VANCOMYCIN HCL IN DEXTROSE 750-5 MG/150ML-% IV SOLN
750.0000 mg | INTRAVENOUS | Status: DC
  Filled 2014-03-28: qty 150

## 2014-03-28 MED ORDER — DEXTROSE 5 % IV SOLN
1.0000 g | Freq: Once | INTRAVENOUS | Status: DC
  Filled 2014-03-28: qty 1

## 2014-03-28 MED ORDER — GABAPENTIN 100 MG PO CAPS
100.0000 mg | ORAL_CAPSULE | Freq: Two times a day (BID) | ORAL | Status: DC
  Administered 2014-03-28 – 2014-03-30 (×4): 100 mg via ORAL
  Filled 2014-03-28 (×6): qty 1

## 2014-03-28 MED ORDER — IPRATROPIUM BROMIDE 0.02 % IN SOLN
0.5000 mg | Freq: Once | RESPIRATORY_TRACT | Status: AC
Start: 2014-03-28 — End: 2014-03-28
  Administered 2014-03-28: 0.5 mg via RESPIRATORY_TRACT
  Filled 2014-03-28: qty 2.5

## 2014-03-28 MED ORDER — GUAIFENESIN ER 600 MG PO TB12
1200.0000 mg | ORAL_TABLET | Freq: Two times a day (BID) | ORAL | Status: DC
  Administered 2014-03-28 – 2014-03-30 (×4): 1200 mg via ORAL
  Filled 2014-03-28 (×5): qty 2

## 2014-03-28 MED ORDER — DEXTROMETHORPHAN POLISTIREX 30 MG/5ML PO LQCR
30.0000 mg | Freq: Two times a day (BID) | ORAL | Status: DC | PRN
Start: 2014-03-28 — End: 2014-03-30
  Filled 2014-03-28: qty 5

## 2014-03-28 MED ORDER — VANCOMYCIN HCL IN DEXTROSE 750-5 MG/150ML-% IV SOLN: 750.0000 mg | INTRAVENOUS | Status: DC

## 2014-03-28 MED ORDER — DEXTROSE 5 % IV SOLN
1.0000 g | Freq: Once | INTRAVENOUS | Status: AC
  Administered 2014-03-28: 1 g via INTRAVENOUS
  Filled 2014-03-28: qty 1

## 2014-03-28 MED ORDER — VANCOMYCIN HCL IN DEXTROSE 1-5 GM/200ML-% IV SOLN
1000.0000 mg | INTRAVENOUS | Status: DC
  Filled 2014-03-28: qty 200

## 2014-03-28 MED ORDER — IPRATROPIUM-ALBUTEROL 0.5-2.5 (3) MG/3ML IN SOLN
3.0000 mL | Freq: Four times a day (QID) | RESPIRATORY_TRACT | Status: DC
  Administered 2014-03-28 – 2014-03-30 (×7): 3 mL via RESPIRATORY_TRACT
  Filled 2014-03-28 (×7): qty 3

## 2014-03-28 MED ORDER — IPRATROPIUM-ALBUTEROL 0.5-2.5 (3) MG/3ML IN SOLN: 3.0000 mL | RESPIRATORY_TRACT | Status: DC | PRN

## 2014-03-28 MED ORDER — DEXTROSE 5 % IV SOLN: 1.0000 g | Freq: Three times a day (TID) | INTRAVENOUS | Status: DC

## 2014-03-28 MED ORDER — ONDANSETRON HCL 4 MG/2ML IJ SOLN: 4.0000 mg | Freq: Four times a day (QID) | INTRAMUSCULAR | Status: DC | PRN

## 2014-03-28 MED ORDER — ACETAMINOPHEN 650 MG RE SUPP: 650.0000 mg | Freq: Four times a day (QID) | RECTAL | Status: DC | PRN

## 2014-03-28 MED ORDER — IOHEXOL 350 MG/ML SOLN
80.0000 mL | Freq: Once | INTRAVENOUS | Status: AC | PRN
  Administered 2014-03-28: 70 mL via INTRAVENOUS

## 2014-03-28 MED ORDER — ENSURE COMPLETE PO LIQD
237.0000 mL | Freq: Two times a day (BID) | ORAL | Status: DC
  Administered 2014-03-29 – 2014-03-30 (×2): 237 mL via ORAL

## 2014-03-28 NOTE — ED Notes (Signed)
Pt presents to department for evaluation of nausea/vomiting, onset today. Pt wears 1L O2 at home. Denies pain at the time. Pt is alert and oriented x4.

## 2014-03-28 NOTE — Consult Note (Signed)
Name: [name] [name] MRN: [id number] DOB: Sep 04, 1925    ADMISSION DATE:  03/28/2014 CONSULTATION DATE:  03/28/2014  REFERRING MD :  [name] [name]  CHIEF COMPLAINT:  SOB  BRIEF PATIENT DESCRIPTION: 81 y.o. F with PMH COPD GOLD III (Followed by PW), brought to [facility] ED 3/1 for SOB.  CT neg for PE but revealed bilateral infiltrates concerning for HCAP. PCCM consulted.  SIGNIFICANT EVENTS  2/2 - 2/19 hospitalized for septic shock due to pyelonephritis, colitis, CAP.  Discharged to [facility] 3/1 - admitted with presumed HCAP  STUDIES:  CTA Chest 3/1 >>> no PE, several areas of patchy infiltrate, greatest in medial segments of both LL's and lateral and posterior segment RLL.  Small left effusion.   HISTORY OF PRESENT ILLNESS:  [name] [name] is a 81 y.o. F w PMH as outlined below including GOLD III COPD, followed by Dr. Joya Gaskins as an outpatient.  She is chronically on 1L O2.  She had recent hospitalization 2/2 - 2/19 for septic shock due to pyelo, colitis, and CAP.  She was discharged to [facility] where she stayed for 1 week before returning home.  She developed congestion and URI symptoms on 2/26.  She was seen at [facility] on 2/28 and discharged on Levaquin.  Since then SOB worsened and she developed N/V therefore was unable to take the Levaquin.  She also began to cough up yellow colored sputum; therefore, she came to [facility] ED for further evaluation.  No fevers/chills/sweats, headache, myalgias.  Has mild chest pain associated with coughing.  In ED, CXR was negative. CTA was obtained which ruled out PE but did reveal bilateral infiltrates which given her hx, are concerning for HCAP vs viral illness.  She was admitted by [facility] and PCCM was consulted.  PFT's from 02/08/09:  FEV1 74 pre / 90 post, ratio 69 pre / 74 post.   PAST MEDICAL HISTORY :   has a past medical history of Osteoporosis; Arthritis; GERD (gastroesophageal reflux disease); Hiatal hernia; PUD (peptic ulcer disease); Cardiac arrhythmia;  Hyperlipidemia; Heart failure; Hypertension; Scoliosis deformity of spine; Choledocholithiasis; Pancolitis; Abdominal pain; Chronic anemia; Chronic back pain; Pancreatitis; CHF (congestive heart failure); and COPD (chronic obstructive pulmonary disease).  has past surgical history that includes Cholecystectomy (2008); Abdominal hysterectomy (1970s); Spine surgery [phone]); Neck surgery (1970s); Spinal cord stimulator implant; ERCP w/ sphicterotomy (02/2010); Back surgery; Pain pump implantation (2009); Flexible sigmoidoscopy (N/A, 01/24/2013); and Esophagogastroduodenoscopy (N/A, 11/14/2013). Prior to Admission medications   Medication Sig Start Date End Date Taking? Authorizing Provider  albuterol (PROVENTIL HFA;VENTOLIN HFA) 108 (90 BASE) MCG/ACT inhaler Inhale 1-2 puffs into the lungs 3 (three) times daily as needed for wheezing or shortness of breath (wheezing & shortness of breath).    Yes Historical Provider, MD  Ascorbic Acid (VITAMIN C) 500 MG tablet Take 500 mg by mouth daily.     Yes Historical Provider, MD  aspirin 325 MG tablet Take 1 tablet (325 mg total) by mouth daily. 03/09/14  Yes Robbie Lis, MD  budesonide-formoterol [facility]) 160-4.5 MCG/ACT inhaler Inhale 2 puffs into the lungs daily as needed (shortness of breath).  11/01/12  Yes Elsie Stain, MD  butalbital-acetaminophen-caffeine (FIORICET, ESGIC) [phone] MG per tablet Take 1 tablet by mouth every 8 (eight) hours as needed for headache (headache).    Yes Historical Provider, MD  Calcium Carbonate-Vitamin D (CALCIUM 600-D) 600-400 MG-UNIT per tablet Take 1 tablet by mouth 2 (two) times daily with a meal.     Yes Historical Provider, MD  calcium-vitamin D (OSCAL WITH D) 500-200 MG-UNIT per tablet Take 2 tablets by mouth 2 (two) times daily.   Yes Historical Provider, MD  Clotrimazole 1 % OINT Take 1 application by mouth 2 (two) times daily as needed (mouth sores).  10/31/13  Yes Debbrah Alar, NP  gabapentin  (NEURONTIN) 100 MG capsule Take 1 capsule (100 mg total) by mouth 2 (two) times daily. 08/23/13  Yes Debbrah Alar, NP  guaiFENesin (MUCINEX) 600 MG 12 hr tablet Take 600 mg by mouth daily as needed.   Yes Historical Provider, MD  lactulose (CHRONULAC) 10 GM/15ML solution Take 10 g by mouth daily as needed for mild constipation (constipation). 10 ml by mouth once daily at bedtime 03/30/13  Yes Debbrah Alar, NP  levofloxacin (LEVAQUIN) 750 MG tablet Take 1 tablet (750 mg total) by mouth daily. X 7 days 03/26/14  Yes Malvin Johns, MD  metoprolol (LOPRESSOR) 50 MG tablet Take 1 tablet (50 mg total) by mouth 2 (two) times daily. 03/09/14  Yes Robbie Lis, MD  nitroGLYCERIN (NITROSTAT) 0.4 MG SL tablet Place 1 tablet (0.4 mg total) under the tongue every 5 (five) minutes as needed for chest pain. 11/11/10  Yes Darlin Coco, MD  ondansetron (ZOFRAN) 4 MG tablet Take 1 tablet (4 mg total) by mouth every 6 (six) hours as needed for nausea. 03/09/14  Yes Robbie Lis, MD  oxyCODONE-acetaminophen (PERCOCET/ROXICET) 5-325 MG per tablet Take 1 tablet by mouth every 6 (six) hours as needed for moderate pain or severe pain (pain). Patient taking differently: Take 1 tablet by mouth 2 (two) times daily.  03/09/14  Yes Robbie Lis, MD  polyethylene glycol [facility] Floria Raveling) packet Take 17 g by mouth daily as needed for mild constipation or moderate constipation. 01/27/13  Yes Donne Hazel, MD  Probiotic Product (PROBIOTIC DAILY PO) Take 1 tablet by mouth daily.   Yes Historical Provider, MD  saccharomyces boulardii (FLORASTOR) 250 MG capsule Take 1 capsule (250 mg total) by mouth 2 (two) times daily. 03/09/14  Yes Robbie Lis, MD  VITAMIN E PO Take 1 tablet by mouth daily.    Yes Historical Provider, MD  acetaminophen (TYLENOL) 325 MG tablet Take 2 tablets (650 mg total) by mouth every 6 (six) hours as needed for mild pain (or Fever >/= 101). 03/09/14   Robbie Lis, MD  omeprazole (PRILOSEC) 40 MG  capsule Take 1 capsule (40 mg total) by mouth 2 (two) times daily. Patient not taking: Reported on 03/28/2014 11/16/13   Thurnell Lose, MD   Allergies  Allergen Reactions  . Codeine     REACTION: n/v/d, HA  . Levaquin [Levofloxacin In D5w] Nausea And Vomiting  . Morphine     REACTION: n/v/d, HA  . Sulfa Antibiotics     Sick    . Zoledronic Acid     REACTION: Severe edema    FAMILY HISTORY:  family history includes Cancer in her mother; Heart disease in her father; Hypertension in her other. SOCIAL HISTORY:  reports that she quit smoking about 26 years ago. Her smoking use included Cigarettes. She has a 12 pack-year smoking history. She has never used smokeless tobacco. She reports that she does not drink alcohol or use illicit drugs.  REVIEW OF SYSTEMS:   All negative; except for those that are bolded, which indicate positives.  Constitutional: weight loss, weight gain, night sweats, fevers, chills, fatigue, weakness.  HEENT: headaches, sore throat, sneezing, nasal congestion, post nasal drip, difficulty swallowing,  tooth/dental problems, visual complaints, visual changes, ear aches. Neuro: difficulty with speech, weakness, numbness, ataxia. CV:  chest pain, orthopnea, PND, swelling in lower extremities, dizziness, palpitations, syncope.  Resp: cough, hemoptysis, dyspnea, wheezing. GI  heartburn, indigestion, abdominal pain, nausea, vomiting, diarrhea, constipation, change in bowel habits, loss of appetite, hematemesis, melena, hematochezia.  GU: dysuria, change in color of urine, urgency or frequency, flank pain, hematuria. MSK: joint pain or swelling, decreased range of motion. Psych: change in mood or affect, depression, anxiety, suicidal ideations, homicidal ideations. Skin: rash, itching, bruising.   SUBJECTIVE:   VITAL SIGNS: Temp:  [98.6 F (37 C)-99.4 F (37.4 C)] 99.4 F (37.4 C) (03/01 1657) Pulse Rate:  [73-96] 83 (03/01 1657) Resp:  [11-24] 20 (03/01  1657) BP: (105-149)/(53-68) 144/60 mmHg (03/01 1657) SpO2:  [90 %-100 %] 90 % (03/01 1657)  PHYSICAL EXAMINATION: General: Elderly female, resting in bed visiting with family, in NAD. Neuro: A&O x 3, non-focal.  HEENT: [place]/AT. PERRL, sclerae anicteric. Cardiovascular: RRR, no M/R/G.  Lungs: Respirations shallow.  Coarse rhonchi, inspiratory crackles. Abdomen: BS x 4, soft, NT/ND.  Musculoskeletal: No gross deformities, no edema.  Skin: Intact, warm, no rashes.    Recent Labs Lab 03/26/14 1310 03/28/14 1144  NA 139 139  K 4.0 4.0  CL 101 101  CO2 29 30  BUN 11 8  CREATININE 0.67 0.75  GLUCOSE 128* 104*    Recent Labs Lab 03/26/14 1310 03/28/14 1144  HGB 11.3* 11.7*  HCT 35.5* 36.1  WBC 4.4 3.8*  PLT 119* 120*   Ct Angio Chest Pe W/cm &/or Wo Cm  03/28/2014   CLINICAL DATA:  Chest pain and difficulty breathing  EXAM: CT ANGIOGRAPHY CHEST WITH CONTRAST  TECHNIQUE: Multidetector CT imaging of the chest was performed using the standard protocol during bolus administration of intravenous contrast. Multiplanar CT image reconstructions and MIPs were obtained to evaluate the vascular anatomy.  CONTRAST:  37mL OMNIPAQUE IOHEXOL 350 MG/ML SOLN  COMPARISON:  Chest radiograph March 28, 2014 and chest CT November 30, 2013  FINDINGS: There is no demonstrable pulmonary embolus. There is no thoracic aortic aneurysm or dissection. Atherosclerotic change is noted in the periphery of the descending thoracic aorta.  There is asymmetric pleural thickening in the right apex compared to the left, stable. There are areas of subtle opacity in the posterior segment of the right upper lobe. There is a small pleural effusion on the left. There is consolidation in both medial lung bases as well as in the posterior and lateral segments of the right lower lobe.  There are small mediastinal lymph nodes without frank adenopathy. The pericardium is not thickened.  In the visualized upper abdomen, there is stable  biliary duct air. Aorta is tortuous.  There is extensive degenerative change in the thoracic spine. There is a stimulator with the tip in the mid thoracic region and. There is thoracolumbar dextroscoliosis. There are no blastic or lytic bone lesions. Thyroid appears unremarkable.  Review of the MIP images confirms the above findings.  IMPRESSION: No demonstrable pulmonary embolus.  Several areas of patchy infiltrate with the greatest degree of infiltrate noted in the medial segments of both lower lobes and in the lateral and posterior segment right lower lobe. There is a small left effusion.  No adenopathy.  Air in the biliary ductal system is a stable finding. Suspect previous sphincterotomy as the etiology for this appearance.   Electronically Signed   By: Lowella Grip III M.D.   On: 03/28/2014  14:28   Dg Chest Port 1 View  03/28/2014   CLINICAL DATA:  Productive cough for 2 days, shortness of breath, congestion  EXAM: PORTABLE CHEST - 1 VIEW  COMPARISON:  03/18/2014  FINDINGS: Cardiomediastinal silhouette is stable. No acute infiltrate or pulmonary edema. Mild thoracic levoscoliosis. Spinal wires stimulator mid thoracic spine again noted.  IMPRESSION: No active disease.   Electronically Signed   By: Lahoma Crocker M.D.   On: 03/28/2014 12:16    ASSESSMENT / PLAN:  Acute on chronic hypoxic respiratory failure COPD without evidence of exacerbation ? HCAP vs viral illness - bilateral infiltrates on CTA chest but very subtle and not impressive Aspiration reoccurence? Recs: Continue supplemental O2 as needed to maintain SpO2 > 92%. Continue DuoNebs / Albuterol. Budesonide / Brovana in lieu of outpatient Symbicort. No role systemic steroids. Continue empirix abx for now, follow cultures. PCT to limit abx exposure and help guide therapy. Pulmonary hygiene.  Chronic diastolic heart failure - Echo from 10/13/11:  EF 60-65%, grade 1 DD.  Recs: Check BNP. Diureses as BP and SCr permit. EKG noted NSR,  repeat troponin. Consider repeat echo.   Montey Hora, [facility] Pager: [phone]  or [phone] 03/28/2014, 5:48 PM    STAFF NOTE: I, Merrie Roof, MD FACP have personally reviewed patient's available data, including medical history, events of note, physical examination and test results as part of my evaluation. I have discussed with resident/NP and other care providers such as pharmacist, RN and RRT. In addition, I personally evaluated patient and elicited key findings of: CT is unimpressive bases, she has had a harse voice and N/V - viral illness?, agree with current HCAP coverage, assess pct, bnp, may need consider diuresis, lactic acid re assuring, no role systemic roids, updated pt and family in full, follow up pcxr in am , needs official SLP, will follow in am   Lavon Paganini. Titus Mould, MD, [place] Pgr: [facility] 03/28/2014 9:02 PM

## 2014-03-28 NOTE — ED Notes (Signed)
Patient transported to CT 
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
## 2014-03-28 NOTE — Progress Notes (Addendum)
Patient trasfered from ED to [phone] via stretcher; alert and oriented x 4; no complaints of pain; IV  in LAC and fluids running NS@100cc /hr; skin intact. Orient patient to room and unit; gave patient care guide; instructed how to use the call bell and  fall risk precautions. Will continue to monitor the patient.
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
## 2014-03-28 NOTE — ED Notes (Signed)
Phlebotomy collected x 2 blood cultures prior to abx admin

## 2014-03-28 NOTE — Progress Notes (Signed)
ANTIBIOTIC CONSULT NOTE - INITIAL  Pharmacy Consult for Vancomyin Indication: pneumonia (HCAP)  Allergies  Allergen Reactions  . Codeine     REACTION: n/v/d, HA  . Morphine     REACTION: n/v/d, HA  . Sulfa Antibiotics     Sick    . Zoledronic Acid     REACTION: Severe edema    Patient Measurements:   Adjusted Body Weight:    Vital Signs: Temp: 98.6 F (37 C) (03/01 1123) Temp Source: Oral (03/01 1123) BP: 141/64 mmHg (03/01 1430) Pulse Rate: 86 (03/01 1430) Intake/Output from previous day:   Intake/Output from this shift:    Labs:  Recent Labs  03/26/14 1310 03/28/14 1144  WBC 4.4 3.8*  HGB 11.3* 11.7*  PLT 119* 120*  CREATININE 0.67 0.75   Estimated Creatinine Clearance: 39.2 mL/min (by C-G formula based on Cr of 0.75). No results for input(s): VANCOTROUGH, VANCOPEAK, VANCORANDOM, GENTTROUGH, GENTPEAK, GENTRANDOM, TOBRATROUGH, TOBRAPEAK, TOBRARND, AMIKACINPEAK, AMIKACINTROU, AMIKACIN in the last 72 hours.   Microbiology:   Medical History: Past Medical History  Diagnosis Date  . Osteoporosis   . Arthritis     osteoarthritis  . GERD (gastroesophageal reflux disease)   . Hiatal hernia   . PUD (peptic ulcer disease)   . Cardiac arrhythmia   . Hyperlipidemia   . Heart failure   . Hypertension   . Scoliosis deformity of spine     history of intractable back pain  . Choledocholithiasis   . Pancolitis     Infectious vs. inflammatory  . Abdominal pain     Resolved  . Chronic anemia   . Chronic back pain   . Pancreatitis     History of  . CHF (congestive heart failure)     Diastolic  on ECHO 1779  . COPD (chronic obstructive pulmonary disease)     oxygen dependent at  night 2LPM    Medications:  See med rec  Assessment: N/V, cough, SOB, no diarrhea  81 y/o F presents to ED with above complaints. She was recently d/c'd (2/2-2/19) from hospital with PNA, UTI, septic shock, colitis. She came back to ED 2/28 c/o congestion/SOB but was  discharged home. CT negative for PE. WBC 3.8. Scr 0.75, CrCl 39.  Goal of Therapy:  Vancomycin trough level 15-20 mcg/ml  Plan:  Vancomycin 1g IV x 1 in ED, then 750mg  IV q24h Vancomycin trough after 3-5 doses at steady state.   Peyson Delao S. Alford Highland, PharmD, BCPS Clinical Staff Pharmacist Pager [phone]  Eilene Ghazi Stillinger 03/28/2014,2:50 PM

## 2014-03-28 NOTE — Progress Notes (Signed)
[facility]  Patient Status: Active (receiving services up to time of hospitalization)  [facility] is providing the following services: RN, PT and OT  If patient discharges after hours, please call [phone].   [name] 03/28/2014, 5:04 PM

## 2014-03-28 NOTE — ED Notes (Signed)
Attempted report 

## 2014-03-29 ENCOUNTER — Inpatient Hospital Stay (HOSPITAL_COMMUNITY): Payer: Medicare Other

## 2014-03-29 ENCOUNTER — Telehealth: Payer: Self-pay | Admitting: Critical Care Medicine

## 2014-03-29 DIAGNOSIS — J438 Other emphysema: Secondary | ICD-10-CM

## 2014-03-29 DIAGNOSIS — E780 Pure hypercholesterolemia: Secondary | ICD-10-CM

## 2014-03-29 DIAGNOSIS — G8929 Other chronic pain: Secondary | ICD-10-CM

## 2014-03-29 DIAGNOSIS — M549 Dorsalgia, unspecified: Secondary | ICD-10-CM

## 2014-03-29 DIAGNOSIS — J441 Chronic obstructive pulmonary disease with (acute) exacerbation: Principal | ICD-10-CM

## 2014-03-29 DIAGNOSIS — R0602 Shortness of breath: Secondary | ICD-10-CM | POA: Insufficient documentation

## 2014-03-29 DIAGNOSIS — E440 Moderate protein-calorie malnutrition: Secondary | ICD-10-CM

## 2014-03-29 DIAGNOSIS — M810 Age-related osteoporosis without current pathological fracture: Secondary | ICD-10-CM

## 2014-03-29 LAB — INFLUENZA PANEL BY PCR (TYPE A & B)
H1N1 flu by pcr: NOT DETECTED
Influenza A By PCR: NEGATIVE
Influenza B By PCR: NEGATIVE

## 2014-03-29 LAB — CBC
HCT: 31.8 % — ABNORMAL LOW (ref 36.0–46.0)
Hemoglobin: 10.1 g/dL — ABNORMAL LOW (ref 12.0–15.0)
MCH: 30.1 pg (ref 26.0–34.0)
MCHC: 31.8 g/dL (ref 30.0–36.0)
MCV: 94.6 fL (ref 78.0–100.0)
Platelets: 121 10*3/uL — ABNORMAL LOW (ref 150–400)
RBC: 3.36 MIL/uL — ABNORMAL LOW (ref 3.87–5.11)
RDW: 13.3 % (ref 11.5–15.5)
WBC: 2.4 10*3/uL — ABNORMAL LOW (ref 4.0–10.5)

## 2014-03-29 LAB — BASIC METABOLIC PANEL
Anion gap: 6 (ref 5–15)
BUN: 8 mg/dL (ref 6–23)
CO2: 32 mmol/L (ref 19–32)
Calcium: 8 mg/dL — ABNORMAL LOW (ref 8.4–10.5)
Chloride: 104 mmol/L (ref 96–112)
Creatinine, Ser: 0.68 mg/dL (ref 0.50–1.10)
GFR calc Af Amer: 89 mL/min — ABNORMAL LOW (ref >90)
GFR calc non Af Amer: 77 mL/min — ABNORMAL LOW (ref >90)
Glucose, Bld: 96 mg/dL (ref 70–99)
Potassium: 3.9 mmol/L (ref 3.5–5.1)
Sodium: 142 mmol/L (ref 135–145)

## 2014-03-29 LAB — URINE CULTURE: Colony Count: >=100000

## 2014-03-29 MED ORDER — FUROSEMIDE 10 MG/ML IJ SOLN
40.0000 mg | Freq: Once | INTRAMUSCULAR | Status: AC
  Administered 2014-03-29: 40 mg via INTRAVENOUS
  Filled 2014-03-29: qty 4

## 2014-03-29 MED ORDER — BUDESONIDE 0.5 MG/2ML IN SUSP
0.5000 mg | Freq: Two times a day (BID) | RESPIRATORY_TRACT | Status: DC
  Administered 2014-03-29 – 2014-03-30 (×3): 0.5 mg via RESPIRATORY_TRACT
  Filled 2014-03-29 (×5): qty 2

## 2014-03-29 MED ORDER — BUTALBITAL-APAP-CAFFEINE 50-325-40 MG PO TABS
1.0000 | ORAL_TABLET | ORAL | Status: DC | PRN
  Administered 2014-03-30: 1 via ORAL
  Filled 2014-03-29: qty 1

## 2014-03-29 MED ORDER — KETOROLAC TROMETHAMINE 15 MG/ML IJ SOLN
15.0000 mg | Freq: Once | INTRAMUSCULAR | Status: AC
  Administered 2014-03-29: 15 mg via INTRAVENOUS
  Filled 2014-03-29: qty 1

## 2014-03-29 MED ORDER — POTASSIUM CHLORIDE CRYS ER 20 MEQ PO TBCR
40.0000 meq | EXTENDED_RELEASE_TABLET | Freq: Once | ORAL | Status: AC
  Administered 2014-03-29: 40 meq via ORAL
  Filled 2014-03-29: qty 2

## 2014-03-29 MED ORDER — DEXTROSE 5 % IV SOLN
1.0000 g | INTRAVENOUS | Status: DC
  Filled 2014-03-29: qty 1

## 2014-03-29 NOTE — Evaluation (Signed)
Clinical/Bedside Swallow Evaluation Patient Details  Name: [name] [name] MRN: [id number] Date of Birth: 21-Nov-1925  Today's Date: 03/29/2014 Time: SLP Start Time (ACUTE ONLY): 3532 SLP Stop Time (ACUTE ONLY): 0959 SLP Time Calculation (min) (ACUTE ONLY): 35 min  Past Medical History:  Past Medical History  Diagnosis Date  . Osteoporosis   . GERD (gastroesophageal reflux disease)   . Hiatal hernia   . PUD (peptic ulcer disease)   . Cardiac arrhythmia   . Hyperlipidemia   . Hypertension   . Scoliosis deformity of spine     history of intractable back pain  . Choledocholithiasis   . Pancolitis     Infectious vs. inflammatory  . Abdominal pain     Resolved  . Chronic anemia   . Chronic back pain   . Pancreatitis     History of  . CHF (congestive heart failure)     Diastolic  on ECHO 9924  . COPD (chronic obstructive pulmonary disease)     oxygen dependent at  night 2LPM  . PONV (postoperative nausea and vomiting)   . Reactive airway disease that is not asthma   . HCAP (healthcare-associated pneumonia) 03/28/2014  . Pneumonia 1930's; 2011 X 2; 02/2014  . On home oxygen therapy     "1L during the day; 2L q hs" (03/28/2014)  . Daily headache   . Osteoarthritis     "pretty much q where"   . Recurrent UTI (urinary tract infection)     "here lately" (03/28/2014)   Past Surgical History:  Past Surgical History  Procedure Laterality Date  . Cholecystectomy  2008  . Abdominal hysterectomy  1970s  . Spine surgery  [phone]  . Cervical laminectomy  1970s  . Spinal cord stimulator implant  ~ 2009  . Ercp w/ sphicterotomy  02/2010  . Pain pump implantation  ~ 2010    back, dilaudid and bupravacaine  . Flexible sigmoidoscopy N/A 01/24/2013    Procedure: FLEXIBLE SIGMOIDOSCOPY;  Surgeon: Missy Sabins, MD;  Location: [facility];  Service: Endoscopy;  Laterality: N/A;  . Esophagogastroduodenoscopy N/A 11/14/2013    Procedure: ESOPHAGOGASTRODUODENOSCOPY (EGD);  Surgeon: Missy Sabins, MD;  Location: [facility] ENDOSCOPY;  Service: Endoscopy;  Laterality: N/A;  . Breast cyst excision Right 1960's  . Back surgery      x 5   HPI:  81 y.o. F with PMH COPD GOLD III (Followed by PW), brought to [facility] ED 3/1 for SOB. CT neg for PE but revealed bilateral infiltrates concerning for HCAP.   Assessment / Plan / Recommendation Clinical Impression  Pt's oropharyngeal swallow appears WFL with adequate coordination of breath/swallow as evidenced by consistent exhalation post-swallow. Pt consumed thin liquids as well as mixed consistency boluses with no overt signs of aspiration. Pt and granddaughter present are without concerns for swallowing at this time. No further SLP f/u recommended.    Aspiration Risk  Mild    Diet Recommendation Regular;Thin liquid   Liquid Administration via: Cup;Straw Medication Administration: Whole meds with liquid Supervision: Patient able to self feed Compensations: Slow rate;Small sips/bites Postural Changes and/or Swallow Maneuvers: Seated upright 90 degrees    Other  Recommendations Oral Care Recommendations: Oral care BID   Follow Up Recommendations  None    Frequency and Duration        Pertinent Vitals/Pain Headache 10/10 (RN present and aware, provided pain meds)    SLP Swallow Goals     Swallow Study Prior Functional Status  General HPI: 81 y.o. F with PMH COPD GOLD III (Followed by PW), brought to [facility] ED 3/1 for SOB. CT neg for PE but revealed bilateral infiltrates concerning for HCAP. Type of Study: Bedside swallow evaluation Previous Swallow Assessment: none in chart Diet Prior to this Study: Regular;Thin liquids Temperature Spikes Noted: No Respiratory Status: Nasal cannula History of Recent Intubation: No Behavior/Cognition: Alert;Cooperative;Pleasant mood Oral Cavity - Dentition: Adequate natural dentition Self-Feeding Abilities: Able to feed self Patient Positioning: Upright in bed Baseline Vocal Quality: Low vocal  intensity    Oral/Motor/Sensory Function Overall Oral Motor/Sensory Function: Appears within functional limits for tasks assessed   Ice Chips Ice chips: Not tested   Thin Liquid Thin Liquid: Within functional limits Presentation: Cup;Self Fed    Nectar Thick Nectar Thick Liquid: Not tested   Honey Thick Honey Thick Liquid: Not tested   Puree Puree: Not tested   Solid      Solid: Within functional limits Presentation: Self Fed;Spoon      Germain Osgood, M.A. CCC-SLP [phone]  Germain Osgood 03/29/2014,10:06 AM

## 2014-03-29 NOTE — Telephone Encounter (Signed)
MR pt, sorry, accidentally put PW

## 2014-03-29 NOTE — Clinical Social Work Psychosocial (Signed)
Clinical Social Work Department BRIEF PSYCHOSOCIAL ASSESSMENT 03/29/2014  Patient:  [name] [name], [name] [name]     Account Number:  [id number]     Admit date:  03/28/2014  Clinical Social Worker:  Lovey Newcomer  Date/Time:  03/29/2014 02:00 PM  Referred by:  Physician  Date Referred:  03/29/2014 Referred for  SNF Placement   Other Referral:   NA   Interview type:  Patient Other interview type:   Patient alert and oriented at time of assessment.    PSYCHOSOCIAL DATA Living Status:  ALONE Admitted from facility:   Level of care:   Primary support name:  [name] Primary support relationship to patient:  CHILD, ADULT Degree of support available:   Support is strong.    CURRENT CONCERNS Current Concerns  Post-Acute Placement   Other Concerns:   NA    SOCIAL WORK ASSESSMENT / PLAN CSW met with patient at bedside to complete assessment. Patient sitting upright in bed, presents with flat affect, and was willing to engaged in assessment. Patient states that PTA she was able to complete all of ADLs independently. CSW explained that MD believes patient will benefit from SNF/CIR at discharge for rehab. Patient agrees that this would be beneficial.    CSW inquired about patient's understanding of the reason for her admission. Patient is able to articulate her understanding of diagnosis and the need for hospitalization. Patient tells CSW that she has a very supportive famimly and speaks of her granddaughter who works for [employer] . Patient states that she does not have preferences for a facility, but states that [facility] would probably be the most convenient for her family. CSW explained SNF search/placement process and answered questions. CSW will follow up with bed offers.   Assessment/plan status:  Psychosocial Support/Ongoing Assessment of Needs Other assessment/ plan:   Complete Fl2, Fax, PASRR   Information/referral to community resources:   CSW contact information and SNF  list given.    PATIENT'S/FAMILY'S RESPONSE TO PLAN OF CARE: Patient plans to DC to CIR or SNF at discharge for continued rehabilitation. Overall patient was pleasant and engaged with assessment. CSW will assist as appropriate.       Liz Beach MSW, [place], [place], [phone]

## 2014-03-29 NOTE — Clinical Social Work Placement (Signed)
Clinical Social Work Department CLINICAL SOCIAL WORK PLACEMENT NOTE 03/29/2014  Patient:  [name] [name], [name] [name]  Account Number:  [id number] [place] date:  03/28/2014  Clinical Social Worker:  Kemper Durie, [place]  Date/time:  03/29/2014 02:00 PM  Clinical Social Work is seeking post-discharge placement for this patient at the following level of care:   [place]   (*CSW will update this form in Epic as items are completed)   03/29/2014  Patient/family provided with [facility] Department of Clinical Social Work's list of facilities offering this level of care within the geographic area requested by the patient (or if unable, by the patient's family).  03/29/2014  Patient/family informed of their freedom to choose among providers that offer the needed level of care, that participate in Medicare, Medicaid or managed care program needed by the patient, have an available bed and are willing to accept the patient.  03/29/2014  Patient/family informed of [facility]' ownership interest in [facility], as well as of the fact that they are under no obligation to receive care at this facility.  PASARR submitted to EDS on 03/29/2014 PASARR number received on 03/29/2014  FL2 transmitted to all facilities in geographic area requested by pt/family on  03/29/2014 FL2 transmitted to all facilities within larger geographic area on   Patient informed that his/her managed care company has contracts with or will negotiate with  certain facilities, including the following:     Patient/family informed of bed offers received:   Patient chooses bed at  Physician recommends and patient chooses bed at    Patient to be transferred to  on   Patient to be transferred to facility by  Patient and family notified of transfer on  Name of family member notified:    The following physician request were entered in Epic:   Additional Comments:    Liz Beach MSW, [place], [place],  [phone] c

## 2014-03-29 NOTE — Consult Note (Addendum)
Name: [name] [name] MRN: [id number] DOB: Nov 30, 1925    ADMISSION DATE:  03/28/2014 CONSULTATION DATE:  03/29/2014  REFERRING MD :  Algis Liming  CHIEF COMPLAINT:  SOB  BRIEF PATIENT DESCRIPTION: 81 y.o. F with PMH COPD GOLD III (Followed by PW), brought to [facility] ED 3/1 for SOB.  CT neg for PE but revealed bilateral infiltrates concerning for HCAP. PCCM consulted.  SIGNIFICANT EVENTS  2/2 - 2/19 hospitalized for septic shock due to pyelonephritis, colitis, CAP.  Discharged to [facility] 3/1 - admitted with presumed HCAP  STUDIES:  CTA Chest 3/1 >>> no PE, several areas of patchy infiltrate, greatest in medial segments of both LL's and lateral and posterior segment RLL.  Small left effusion.   SUBJECTIVE:  Breathing slightly improved overnight.  No chest pain.  Still has productive cough with yellow sputum.   VITAL SIGNS: Temp:  [98.3 F (36.8 C)-99.4 F (37.4 C)] 98.3 F (36.8 C) (03/02 0723) Pulse Rate:  [73-96] 88 (03/02 0911) Resp:  [11-24] 16 (03/02 0911) BP: (105-149)/(53-83) 124/56 mmHg (03/02 0723) SpO2:  [90 %-100 %] 96 % (03/02 0911) FiO2 (%):  [2 %] 2 % (03/01 1900) Weight:  [54.432 kg (120 lb)] 54.432 kg (120 lb) (03/01 1657)  PHYSICAL EXAMINATION: General: Elderly female, resting in bed visiting with family, in NAD. Neuro: A&O x 3, non-focal.  HEENT: /AT. PERRL, sclerae anicteric. Cardiovascular: RRR, no M/R/G.  Lungs: Respirations shallow.  Coarse rhonchi, inspiratory crackles bilaterally. Abdomen: BS x 4, soft, NT/ND.  Musculoskeletal: No gross deformities, no edema.  Skin: Intact, warm, no rashes.   Recent Labs Lab 03/26/14 1310 03/28/14 1144 03/29/14 0500  NA 139 139 142  K 4.0 4.0 3.9  CL 101 101 104  CO2 29 30 32  BUN 11 8 8   CREATININE 0.67 0.75 0.68  GLUCOSE 128* 104* 96    Recent Labs Lab 03/26/14 1310 03/28/14 1144 03/29/14 0500  HGB 11.3* 11.7* 10.1*  HCT 35.5* 36.1 31.8*  WBC 4.4 3.8* 2.4*  PLT 119* 120* 121*   Ct Angio Chest Pe  W/cm &/or Wo Cm  03/28/2014   CLINICAL DATA:  Chest pain and difficulty breathing  EXAM: CT ANGIOGRAPHY CHEST WITH CONTRAST  TECHNIQUE: Multidetector CT imaging of the chest was performed using the standard protocol during bolus administration of intravenous contrast. Multiplanar CT image reconstructions and MIPs were obtained to evaluate the vascular anatomy.  CONTRAST:  97mL OMNIPAQUE IOHEXOL 350 MG/ML SOLN  COMPARISON:  Chest radiograph March 28, 2014 and chest CT November 30, 2013  FINDINGS: There is no demonstrable pulmonary embolus. There is no thoracic aortic aneurysm or dissection. Atherosclerotic change is noted in the periphery of the descending thoracic aorta.  There is asymmetric pleural thickening in the right apex compared to the left, stable. There are areas of subtle opacity in the posterior segment of the right upper lobe. There is a small pleural effusion on the left. There is consolidation in both medial lung bases as well as in the posterior and lateral segments of the right lower lobe.  There are small mediastinal lymph nodes without frank adenopathy. The pericardium is not thickened.  In the visualized upper abdomen, there is stable biliary duct air. Aorta is tortuous.  There is extensive degenerative change in the thoracic spine. There is a stimulator with the tip in the mid thoracic region and. There is thoracolumbar dextroscoliosis. There are no blastic or lytic bone lesions. Thyroid appears unremarkable.  Review of the MIP images confirms the above  findings.  IMPRESSION: No demonstrable pulmonary embolus.  Several areas of patchy infiltrate with the greatest degree of infiltrate noted in the medial segments of both lower lobes and in the lateral and posterior segment right lower lobe. There is a small left effusion.  No adenopathy.  Air in the biliary ductal system is a stable finding. Suspect previous sphincterotomy as the etiology for this appearance.   Electronically Signed   By: Lowella Grip III M.D.   On: 03/28/2014 14:28   Dg Chest Port 1 View  03/29/2014   CLINICAL DATA:  Pneumonia.  Shortness of breath.  EXAM: PORTABLE CHEST - 1 VIEW  COMPARISON:  CT 03/28/2014.  Chest x-ray 03/28/2014 and 03/26/2014.  FINDINGS: Mediastinum and hilar structures are normal. Mild interstitial prominence noted bilaterally suggesting mild pneumonitis. No pleural effusion or pneumothorax. Skin fold noted on the right. Heart size normal. Neurostimulator noted. No acute bony abnormality.  IMPRESSION: Mild interstitial prominence noted bilaterally suggesting pneumonitis.   Electronically Signed   By: Marcello Moores  Register   On: 03/29/2014 08:38   Dg Chest Port 1 View  03/28/2014   CLINICAL DATA:  Productive cough for 2 days, shortness of breath, congestion  EXAM: PORTABLE CHEST - 1 VIEW  COMPARISON:  03/18/2014  FINDINGS: Cardiomediastinal silhouette is stable. No acute infiltrate or pulmonary edema. Mild thoracic levoscoliosis. Spinal wires stimulator mid thoracic spine again noted.  IMPRESSION: No active disease.   Electronically Signed   By: Lahoma Crocker M.D.   On: 03/28/2014 12:16    ASSESSMENT / PLAN:  Acute on chronic hypoxic respiratory failure COPD without evidence of exacerbation ? HCAP vs viral illness - bilateral infiltrates on CTA chest but very subtle and not impressive.  Negative PCT noted and pt remains afebrile.  Favor viral Aspiration reoccurence? Recs: Continue supplemental O2 as needed to maintain SpO2 > 92%. Continue DuoNebs / Albuterol. Budesonide / Brovana in lieu of outpatient Symbicort. No role systemic steroids. Consider d/c abx, will discuss with Dr. Lake Bells. Pulmonary hygiene. Mobilize as able. Ambulatory desaturation study.  Chronic diastolic heart failure - Echo from 10/13/11:  EF 60-65%, grade 1 DD.  Normal BNP noted Recs: 40mg  lasix x 1. Consider repeat echo.   Montey Hora, [facility] Pager: [phone]  or [phone] 03/29/2014, 11:43 AM   Attending:  I have seen and examined the patient with nurse practitioner/resident and agree with the note above.   She is feeling better On exam I was surprised to hear crackles throughout, but no wheezing Cardiac exam within normal limits, no JVD or leg swelling  CT images and CXR images reviewed  Impression: 1) Acute hypoxemic respiratory failure> presumably due to AE COPD, I question whether or not she has pulmonary edema but the lung exam is benign.  Negative flu swab and lack of fever are encouraging. Will stop antibiotics because this doesn't look like HCAP. 2) Pulmonary edema? > agree with echo and lasix  Will follow  Roselie Awkward, MD [place] [facility] Pager: [phone] Cell: [phone] If no response, call [phone]

## 2014-03-29 NOTE — Progress Notes (Signed)
Rehab Admissions Coordinator Note:  Patient was screened by [name] for appropriateness for an Inpatient Acute Rehab Consult.  At this time, we are recommending Inpatient Rehab consult.  [name] [name] L 03/29/2014, 3:46 PM  I can be reached at [phone].

## 2014-03-29 NOTE — Evaluation (Signed)
Occupational Therapy Evaluation Patient Details Name: [name] [name] MRN: [id number] DOB: 1926/01/09 Today's Date: 03/29/2014    History of Present Illness [name] [name] is a 81 y.o. female, with a past medical history of COPD: Stage III, diastolic heart failure, pancreatitis, hypertension, peptic ulcer disease, who is oxygen dependent. She presents to [facility] ER with cough, wheezing, shortness of breath. The patient had a recent extended hospitalization from 2/2-2/19 for septic shock from pyelonephritis, community-acquired pneumonia, colitis. She was discharged to [facility] where she stayed for one week and then returned to her home. She lives at home alone and walks with the aid of a walker. Pt admitted 03/28/14 with health care assisted pneumonia.   Clinical Impression   PTA pt lived at home alone and was independent with PRN use of RW. Pt currently requires min A for functional mobility due to balance deficits and min guard for LB ADLs. Pt would benefit from CIR to return to Mod I level and enable pt to return home. Pt will benefit from acute OT to address functional mobility and independence with ADLs.     Follow Up Recommendations  CIR;Supervision/Assistance - 24 hour    Equipment Recommendations  None recommended by OT    Recommendations for Other Services       Precautions / Restrictions Precautions Precautions: Fall Precaution Comments: monitor sats Restrictions Weight Bearing Restrictions: No      Mobility Bed Mobility Overal bed mobility: Modified Independent Bed Mobility: Supine to Sit     Supine to sit: Supervision;HOB elevated     General bed mobility comments: cues for technique  Transfers Overall transfer level: Needs assistance Equipment used: None Transfers: Sit to/from Stand Sit to Stand: Min guard         General transfer comment: Min guard for safety and balance. No physical assist needed. Stood from bed x1 and toilet x1.     Balance  Overall balance assessment: Needs assistance Sitting-balance support: No upper extremity supported;Feet supported Sitting balance-Leahy Scale: Good     Standing balance support: No upper extremity supported;During functional activity Standing balance-Leahy Scale: Poor Standing balance comment: Min A during dynamic balance and min guard during static balance at sink.                             ADL Overall ADL's : Needs assistance/impaired Eating/Feeding: Independent;Sitting   Grooming: Min guard;Standing;Wash/dry hands   Upper Body Bathing: Set up;Sitting   Lower Body Bathing: Min guard;Sit to/from stand   Upper Body Dressing : Set up;Sitting   Lower Body Dressing: Min guard;Sit to/from stand   Toilet Transfer: Minimal assistance;Ambulation;Comfort height toilet Toilet Transfer Details (indicate cue type and reason): min A for balance when walking. did not use DME Toileting- Clothing Manipulation and Hygiene: Min guard;Sit to/from stand       Functional mobility during ADLs: Minimal assistance General ADL Comments: Pt reports that she is feeling better. Pt stood with min guard from bed and toilet, but requires min A to balance during functional mobility without use of assistive device.      Vision Vision Assessment?: No apparent visual deficits          Pertinent Vitals/Pain Pain Assessment: No/denies pain     Hand Dominance Right   Extremity/Trunk Assessment Upper Extremity Assessment Upper Extremity Assessment: Generalized weakness   Lower Extremity Assessment Lower Extremity Assessment: Generalized weakness   Cervical / Trunk Assessment Cervical / Trunk Assessment: Normal  Communication Communication Communication: No difficulties   Cognition Arousal/Alertness: Awake/alert Behavior During Therapy: WFL for tasks assessed/performed Overall Cognitive Status: Within Functional Limits for tasks assessed                        Exercises  Exercises: Other exercises Other Exercises Other Exercises: Pt performed shoulder flexion sitting EOB x 10 Bil UEs.  Other Exercises: Pt performed straight leg kicks x10 each leg with 2 second hold sitting EOB.         Home Living Family/patient expects to be discharged to:: Private residence Living Arrangements: Alone Available Help at Discharge: Family;Available PRN/intermittently Type of Home: House Home Access: Ramped entrance     Home Layout: One level     Bathroom Shower/Tub: Occupational psychologist: Handicapped height     Home Equipment: Environmental consultant - 2 wheels;Cane - single point;Bedside commode   Additional Comments: O2 at night      Prior Functioning/Environment Level of Independence: Independent with assistive device(s)        Comments: reports occasionally using RW    OT Diagnosis: Generalized weakness   OT Problem List: Decreased strength;Impaired balance (sitting and/or standing);Cardiopulmonary status limiting activity;Decreased activity tolerance   OT Treatment/Interventions: Self-care/ADL training;Therapeutic exercise;Energy conservation;DME and/or AE instruction;Therapeutic activities;Patient/family education;Balance training    OT Goals(Current goals can be found in the care plan section) Acute Rehab OT Goals Patient Stated Goal: To get stronger and back to independence OT Goal Formulation: With patient Time For Goal Achievement: 04/12/14 Potential to Achieve Goals: Good ADL Goals Pt Will Perform Grooming: with supervision;standing Pt Will Perform Lower Body Bathing: with supervision;sit to/from stand Pt Will Perform Lower Body Dressing: with supervision;sit to/from stand Pt Will Transfer to Toilet: with supervision;ambulating Pt Will Perform Toileting - Clothing Manipulation and hygiene: with supervision;sit to/from stand Pt Will Perform Tub/Shower Transfer: with supervision;ambulating  OT Frequency: Min 2X/week    End of Session  Equipment Utilized During Treatment: Gait belt  Activity Tolerance: Patient tolerated treatment well Patient left: in bed;with call bell/phone within reach;with bed alarm set;with family/visitor present   Time: 3662-9476 OT Time Calculation (min): 17 min Charges:  OT General Charges $OT Visit: 1 Procedure OT Evaluation $Initial OT Evaluation Tier I: 1 Procedure G-Codes:    Juluis Rainier 04-22-2014, 3:29 PM  Cyndie Chime, OTR/L Occupational Therapist [phone] (pager)

## 2014-03-29 NOTE — Evaluation (Signed)
Physical Therapy Evaluation Patient Details Name: [name] [name] MRN: [id number] DOB: [birth year] Today's Date: 03/29/2014   History of Present Illness  [name] [name] is a 81 y.o. female, with a past medical history of COPD: Stage III, diastolic heart failure, pancreatitis, hypertension, peptic ulcer disease, who is oxygen dependent. She presents to [facility] ER with cough, wheezing, shortness of breath. The patient had a recent extended hospitalization from 2/2-2/19 for septic shock from pyelonephritis, community-acquired pneumonia, colitis. She was discharged to [facility] where she stayed for one week and then returned to her home. She lives at home alone and walks with the aid of a walker.he was discharged to [facility] where she stayed for one week and then returned to her home. She lives at home alone and walks with the aid of a walker. Pt admitted with health care assisted pneumonia.  Clinical Impression  Pt presents with general deconditioning and dependencies in mobility affecting her independence. Pt's O2 sats 94 % RA with ambulation to the BR. Pt c/o some increased SOB. Pt's family would like her to ambulate as much as possible to improve endurance. Pt is currently on droplet precautions and family reported influenza is negative so they didn't know why the precautions were still ordered.  I would recommend ambulation with the nursing staff with supplemental O2. Pt would benefit from skilled PT to maximize mobility and improve independence for return home with family and HHPT.     Follow Up Recommendations CIR;Supervision for mobility/OOB    Equipment Recommendations  None recommended by PT    Recommendations for Other Services       Precautions / Restrictions Precautions Precautions: Fall Precaution Comments: monitor sats Restrictions Weight Bearing Restrictions: No      Mobility  Bed Mobility Overal bed mobility: Needs Assistance Bed Mobility: Supine to Sit     Supine  to sit: Supervision;HOB elevated     General bed mobility comments: cues for technique  Transfers     Transfers: Sit to/from Stand Sit to Stand: Min assist         General transfer comment: verbal cues for safe technique, assist to steady with rise  Ambulation/Gait Ambulation/Gait assistance: Min assist Ambulation Distance (Feet): 20 Feet Assistive device: None Gait Pattern/deviations: Step-through pattern;Decreased stride length;Trunk flexed Gait velocity: decreased   General Gait Details: ambulated with RW, but felt like it got in the way. Pt ambulated to BR with min assisted on RA, sats 94% with minimal SOB noted.  Stairs            Wheelchair Mobility    Modified Rankin (Stroke Patients Only)       Balance Overall balance assessment: Needs assistance Sitting-balance support: No upper extremity supported Sitting balance-Leahy Scale: Good     Standing balance support: No upper extremity supported Standing balance-Leahy Scale: Poor                               Pertinent Vitals/Pain Pain Assessment: No/denies pain    Home Living Family/patient expects to be discharged to:: Private residence Living Arrangements: Alone Available Help at Discharge: Family;Available PRN/intermittently Type of Home: House Home Access: Ramped entrance     Home Layout: One level Home Equipment: Walker - 2 wheels;Cane - single point;Bedside commode      Prior Function Level of Independence: Independent with assistive device(s)               Hand Dominance  Extremity/Trunk Assessment   Upper Extremity Assessment: Defer to OT evaluation           Lower Extremity Assessment: Generalized weakness         Communication   Communication: No difficulties  Cognition Arousal/Alertness: Awake/alert Behavior During Therapy: WFL for tasks assessed/performed Overall Cognitive Status: Within Functional Limits for tasks assessed                       General Comments      Exercises        Assessment/Plan    PT Assessment Patient needs continued PT services  PT Diagnosis Difficulty walking;Generalized weakness   PT Problem List Decreased strength;Decreased activity tolerance;Decreased balance;Decreased mobility;Cardiopulmonary status limiting activity  PT Treatment Interventions DME instruction;Gait training;Therapeutic activities;Therapeutic exercise;Functional mobility training;Balance training;Patient/family education   PT Goals (Current goals can be found in the Care Plan section) Acute Rehab PT Goals Patient Stated Goal: To get stronger PT Goal Formulation: With patient Time For Goal Achievement: 04/12/14 Potential to Achieve Goals: Good    Frequency Min 3X/week   Barriers to discharge        Co-evaluation               End of Session Equipment Utilized During Treatment: Oxygen Activity Tolerance: Patient limited by fatigue Patient left: in bed;with call bell/phone within reach;with family/visitor present Nurse Communication: Mobility status         Time: 1210-1235 PT Time Calculation (min) (ACUTE ONLY): 25 min   Charges:   PT Evaluation $Initial PT Evaluation Tier I: 1 Procedure PT Treatments $Gait Training: 8-22 mins   PT G Codes:        Lelon Mast 03/29/2014, 1:07 PM

## 2014-03-29 NOTE — Care Management Note (Signed)
    Page 1 of 2   03/30/2014     4:55:00 PM CARE MANAGEMENT NOTE 03/30/2014  Patient:  [name] [name], [name] [name]   Account Number:  [id number]  Date Initiated:  03/29/2014  Documentation initiated by:  Tomi Bamberger  Subjective/Objective Assessment:   dx hcp, sob, chf, copd gold.  admit- lives alone. Family does not want [name].     Action/Plan:   pt eval- rec snf  pt refused snf,   Anticipated DC Date:  03/30/2014   Anticipated DC Plan:  SKILLED NURSING FACILITY  In-house referral  Clinical Social Worker      DC Forensic scientist  CM consult      [facility] Choice  HOME HEALTH   Choice offered to / List presented to:  C-1 Patient        [place] arranged  HH-1 RN  [place].   Status of service:  Completed, signed off Medicare Important Message given?  NA - LOS <3 / Initial given by admissions (If response is "NO", the following Medicare IM given date fields will be blank) Date Medicare IM given:   Medicare IM given by:   Date Additional Medicare IM given:   Additional Medicare IM given by:    Discharge Disposition:  [place]  Per UR Regulation:  Reviewed for med. necessity/level of care/duration of stay  If discussed at [place] of Stay Meetings, dates discussed:    Comments:  03/30/14 [place] RN,BSN [phone] patient grand daughter states they are active with [facility], notified [facility], that patient is for dc today.  03/29/14 [place] BSN [phone] patient is from home alone, per pt eval rec snf, family does not want patient to go to [facility], [place] aware. This patient is also being followed by [employer] per [place]. NCM made referral to [facility] for COPD Gold.

## 2014-03-29 NOTE — Progress Notes (Signed)
INITIAL NUTRITION ASSESSMENT  DOCUMENTATION CODES Per approved criteria  -Non-severe (moderate) malnutrition in the context of acute illness or injury  Pt meets criteria for non-severe moderate malnutrition in the context of acute illness or injury as evidenced by 1-2% wt loss in 1 week, and mild-moderate body fat and muscle mass depletion.  INTERVENTION: Provide snacks (saltines, graham crackers, jello and ice cream) BID  Encourage PO intake  NUTRITION DIAGNOSIS: Increased nutrient needs related to HCAP as evidenced by estimated energy needs.   Goal: Pt to meet >/= 90% of estimated energy requirements   Monitor:  PO intake, weight, labs  Reason for Assessment: Consult for assessment of nutrition requirements/status   81 y.o. female  Admitting Dx: HCAP (healthcare-associated pneumonia)  ASSESSMENT: 81 y/o female with past medical history of COPD, brought to [facility] ED for SOB. CT was negative for PE but revealed bilateral infiltrates concerning for HCAP.   Labs- low calcium Receiving Ensure BID, Calcium-Vitamin D, Vitamin C Pt's granddaughter in room upon assessment. Pt was feeling very nauseous d/t taking pills on empty stomach. Per granddaughter, pt has had a decreased appetite and poor PO intake since 2/2. She has lost 4% wt in 10 days. Current PO intake is 15-75%. Per granddaughter, she does not like Ensure, [employer] or supplemental ice cream. Suggested saltines, graham crackers, jello and non-supplemental ice cream for snacks throughout day. Explained the importance of snacking throughout day to provide energy, and to help tolerate medicine. Provided saltines for pt. NFPE indicated mild-moderate muscle mass and body fat depletion; however, some can be attributed to age.  Nutrition Focused Physical Exam:  Subcutaneous Fat:  Orbital Region: moderate depletion Upper Arm Region: moderate depletion Thoracic and Lumbar Region: n/a  Muscle:  Temple Region: moderate  depletion Clavicle Bone Region: moderate depletion Clavicle and Acromion Bone Region: moderate depletion Scapular Bone Region: n/a Dorsal Hand: mild depletion Patellar Region: moderate depletion Anterior Thigh Region: moderate depletion Posterior Calf Region: moderate depletion  Edema: not present  Height: Ht Readings from Last 1 Encounters:  03/28/14 5' (1.524 m)    Weight: Wt Readings from Last 1 Encounters:  03/28/14 120 lb (54.432 kg)    Ideal Body Weight: 100 lb (45.4 kg)  % Ideal Body Weight: 119%  Wt Readings from Last 10 Encounters:  03/28/14 120 lb (54.432 kg)  03/17/14 120 lb 12.8 oz (54.795 kg)  03/10/14 125 lb (56.7 kg)  03/04/14 125 lb 7.1 oz (56.9 kg)  12/05/13 118 lb (53.524 kg)  11/24/13 114 lb 9.6 oz (51.982 kg)  11/13/13 115 lb (52.164 kg)  10/31/13 114 lb 12.8 oz (52.073 kg)  08/08/13 115 lb 0.6 oz (52.182 kg)  03/30/13 117 lb (53.071 kg)    Usual Body Weight: 116 lb (52.7 kg)  % Usual Body Weight: 96%  BMI:  Body mass index is 23.44 kg/(m^2). Normal  Estimated Nutritional Needs: Kcal: 1400-1600 Protein: 60-75 grams Fluid: >/= 1.5L daily  Skin: intact  Diet Order: Diet Heart  EDUCATION NEEDS: -No education needs identified at this time   Intake/Output Summary (Last 24 hours) at 03/29/14 0915 Last data filed at 03/29/14 0600  Gross per 24 hour  Intake 594.17 ml  Output    300 ml  Net 294.17 ml    Last BM: PTA   Labs:   Recent Labs Lab 03/26/14 1310 03/28/14 1144 03/29/14 0500  NA 139 139 142  K 4.0 4.0 3.9  CL 101 101 104  CO2 29 30 32  BUN 11 8  8  CREATININE 0.67 0.75 0.68  CALCIUM 8.5 8.3* 8.0*  GLUCOSE 128* 104* 96    CBG (last 3)  No results for input(s): GLUCAP in the last 72 hours.  Scheduled Meds: . arformoterol  15 mcg Nebulization BID  . aspirin  325 mg Oral Daily  . budesonide (PULMICORT) nebulizer solution  0.5 mg Nebulization BID  . calcium-vitamin D  2 tablet Oral BID  . ceFEPime (MAXIPIME) IV   1 g Intravenous Q24H  . docusate sodium  100 mg Oral BID  . enoxaparin (LOVENOX) injection  40 mg Subcutaneous Q24H  . famotidine  20 mg Oral BID  . feeding supplement (ENSURE COMPLETE)  237 mL Oral BID BM  . gabapentin  100 mg Oral BID  . guaiFENesin  1,200 mg Oral BID  . ipratropium-albuterol  3 mL Nebulization Q6H  . metoprolol  50 mg Oral BID  . oxyCODONE-acetaminophen  1 tablet Oral BID  . saccharomyces boulardii  250 mg Oral BID  . sodium chloride  3 mL Intravenous Q12H  . vancomycin  750 mg Intravenous Q24H  . vitamin C  500 mg Oral Daily    Continuous Infusions:   Past Medical History  Diagnosis Date  . Osteoporosis   . GERD (gastroesophageal reflux disease)   . Hiatal hernia   . PUD (peptic ulcer disease)   . Cardiac arrhythmia   . Hyperlipidemia   . Hypertension   . Scoliosis deformity of spine     history of intractable back pain  . Choledocholithiasis   . Pancolitis     Infectious vs. inflammatory  . Abdominal pain     Resolved  . Chronic anemia   . Chronic back pain   . Pancreatitis     History of  . CHF (congestive heart failure)     Diastolic  on ECHO 2197  . COPD (chronic obstructive pulmonary disease)     oxygen dependent at  night 2LPM  . PONV (postoperative nausea and vomiting)   . Reactive airway disease that is not asthma   . HCAP (healthcare-associated pneumonia) 03/28/2014  . Pneumonia 1930's; 2011 X 2; 02/2014  . On home oxygen therapy     "1L during the day; 2L q hs" (03/28/2014)  . Daily headache   . Osteoarthritis     "pretty much q where"   . Recurrent UTI (urinary tract infection)     "here lately" (03/28/2014)    Past Surgical History  Procedure Laterality Date  . Cholecystectomy  2008  . Abdominal hysterectomy  1970s  . Spine surgery  [id number]  . Cervical laminectomy  1970s  . Spinal cord stimulator implant  ~ 2009  . Ercp w/ sphicterotomy  02/2010  . Pain pump implantation  ~ 2010    back, dilaudid and bupravacaine  .  Flexible sigmoidoscopy N/A 01/24/2013    Procedure: FLEXIBLE SIGMOIDOSCOPY;  Surgeon: Missy Sabins, MD;  Location: [place];  Service: Endoscopy;  Laterality: N/A;  . Esophagogastroduodenoscopy N/A 11/14/2013    Procedure: ESOPHAGOGASTRODUODENOSCOPY (EGD);  Surgeon: Missy Sabins, MD;  Location: [facility] ENDOSCOPY;  Service: Endoscopy;  Laterality: N/A;  . Breast cyst excision Right 1960's  . Back surgery      x 5    Wynona Dove, MS Dietetic Intern Pager: [phone]

## 2014-03-29 NOTE — Telephone Encounter (Signed)
Will forward to PW as an [place]

## 2014-03-29 NOTE — Progress Notes (Signed)
[facility] PROGRESS NOTE  [name] [name] OLM:[id number] DOB: [birth year] DOA: 03/28/2014 PCP: [name], NP  Assessment/Plan: 1. Initially suspected HCAP, now ruled out 1. CXR unremarkable 2. Pt has been seen by Pulmonary who feels presenting findings are less likely HCAP 3. Recommendations for stopping abx, which have been done 4. Have ordered flutter valve PRN 5. Consideration instead for acute copd exacerbation. Continue scheduled duonebs q6hrs 2. COPD 1. Decreased BS on exam without obvious wheezing on exam 2. Pt is on scheduled neb tx 3. Hx diastolic CHF 1. Repeat 2d echo is pending 2. Last echo on 6/13 with normal EF 4. Hx colitis 1. Stable 2. On probiotic 5. Hx pyelonephritis 1. Presenting UA is unremarkable 2. No leukocytosis and no fevers 3. abx held per above 6. DVT prophylaxis 1. SCD's  Code Status: Full Family Communication: Pt in room, family at bedside (indicate person spoken with, relationship, and if by phone, the number) Disposition Plan: Pending   Consultants:  Pulmonary  Procedures:    Antibiotics:  Cefepime 3/1>>>3//2  Vancomycin 3/1>>>3/2  HPI/Subjective: Feels better today. No acute events noted overnight  Objective: Filed Vitals:   03/28/14 2109 03/29/14 0723 03/29/14 0759 03/29/14 0911  BP: 119/83 124/56    Pulse: 91 76  88  Temp: 98.5 F (36.9 C) 98.3 F (36.8 C)    TempSrc: Oral Oral    Resp: 12 14  16   Height:      Weight:      SpO2: 95% 98% 94% 96%    Intake/Output Summary (Last 24 hours) at 03/29/14 1544 Last data filed at 03/29/14 0600  Gross per 24 hour  Intake 594.17 ml  Output    300 ml  Net 294.17 ml   Filed Weights   03/28/14 1657  Weight: 54.432 kg (120 lb)    Exam:   General:  Awake, in nad  Cardiovascular: regular, s1, s2  Respiratory: normal resp effort, no wheezing  Abdomen: soft,nondistended  Musculoskeletal: perfused, no clubbing   Data Reviewed: Basic Metabolic  Panel:  Recent Labs Lab 03/26/14 1310 03/28/14 1144 03/29/14 0500  NA 139 139 142  [name] 4.0 4.0 3.9  CL 101 101 104  CO2 29 30 32  GLUCOSE 128* 104* 96  BUN 11 8 8   CREATININE 0.67 0.75 0.68  CALCIUM 8.5 8.3* 8.0*   Liver Function Tests:  Recent Labs Lab 03/26/14 1310  AST 20  ALT 11  ALKPHOS 82  BILITOT 0.4  PROT 6.4  ALBUMIN 3.3*   No results for input(s): LIPASE, AMYLASE in the last 168 hours. No results for input(s): AMMONIA in the last 168 hours. CBC:  Recent Labs Lab 03/26/14 1310 03/28/14 1144 03/29/14 0500  WBC 4.4 3.8* 2.4*  NEUTROABS 3.1 2.9  --   HGB 11.3* 11.7* 10.1*  HCT 35.5* 36.1 31.8*  MCV 96.2 95.0 94.6  PLT 119* 120* 121*   Cardiac Enzymes:  Recent Labs Lab 03/26/14 1310 03/28/14 1903  TROPONINI <0.03 <0.03   BNP (last 3 results)  Recent Labs  03/26/14 1310 03/28/14 1903  BNP 101.3* 83.2    ProBNP (last 3 results) No results for input(s): PROBNP in the last 8760 hours.  CBG: No results for input(s): GLUCAP in the last 168 hours.  Recent Results (from the past 240 hour(s))  Urine culture     Status: None   Collection Time: 03/26/14 12:55 PM  Result Value Ref Range Status   Specimen Description URINE, CLEAN CATCH  Final   Special Requests  NONE  Final   Colony Count   Final    >=100,000 COLONIES/ML Performed at [facility]    Culture   Final    KLEBSIELLA OXYTOCA ESCHERICHIA COLI Performed at [facility]    Report Status 03/29/2014 FINAL  Final   Organism ID, Bacteria KLEBSIELLA OXYTOCA  Final   Organism ID, Bacteria ESCHERICHIA COLI  Final      Susceptibility   Escherichia coli - MIC*    AMPICILLIN 4 SENSITIVE Sensitive     CEFAZOLIN <=4 SENSITIVE Sensitive     CEFTRIAXONE <=1 SENSITIVE Sensitive     CIPROFLOXACIN <=0.25 SENSITIVE Sensitive     GENTAMICIN <=1 SENSITIVE Sensitive     LEVOFLOXACIN <=0.12 SENSITIVE Sensitive     NITROFURANTOIN <=16 SENSITIVE Sensitive     TOBRAMYCIN <=1 SENSITIVE  Sensitive     TRIMETH/SULFA <=20 SENSITIVE Sensitive     PIP/TAZO <=4 SENSITIVE Sensitive     * ESCHERICHIA COLI   Klebsiella oxytoca - MIC*    AMPICILLIN >=32 RESISTANT Resistant     CEFAZOLIN 8 SENSITIVE Sensitive     CEFTRIAXONE <=1 SENSITIVE Sensitive     CIPROFLOXACIN <=0.25 SENSITIVE Sensitive     GENTAMICIN <=1 SENSITIVE Sensitive     LEVOFLOXACIN <=0.12 SENSITIVE Sensitive     NITROFURANTOIN <=16 SENSITIVE Sensitive     TOBRAMYCIN <=1 SENSITIVE Sensitive     TRIMETH/SULFA <=20 SENSITIVE Sensitive     PIP/TAZO <=4 SENSITIVE Sensitive     * KLEBSIELLA OXYTOCA  Blood culture (routine x 2)     Status: None (Preliminary result)   Collection Time: 03/28/14  3:02 PM  Result Value Ref Range Status   Specimen Description BLOOD RIGHT HAND  Final   Special Requests BOTTLES DRAWN AEROBIC AND ANAEROBIC 5CC  Final   Culture   Final           BLOOD CULTURE RECEIVED NO GROWTH TO DATE CULTURE WILL BE HELD FOR 5 DAYS BEFORE ISSUING A FINAL NEGATIVE REPORT Performed at [facility]    Report Status PENDING  Incomplete  Blood culture (routine x 2)     Status: None (Preliminary result)   Collection Time: 03/28/14  3:20 PM  Result Value Ref Range Status   Specimen Description BLOOD LEFT HAND  Final   Special Requests BOTTLES DRAWN AEROBIC AND ANAEROBIC 5CC  Final   Culture   Final           BLOOD CULTURE RECEIVED NO GROWTH TO DATE CULTURE WILL BE HELD FOR 5 DAYS BEFORE ISSUING A FINAL NEGATIVE REPORT Performed at [facility]    Report Status PENDING  Incomplete     Studies: Ct Angio Chest Pe W/cm &/or Wo Cm  03/28/2014   CLINICAL DATA:  Chest pain and difficulty breathing  EXAM: CT ANGIOGRAPHY CHEST WITH CONTRAST  TECHNIQUE: Multidetector CT imaging of the chest was performed using the standard protocol during bolus administration of intravenous contrast. Multiplanar CT image reconstructions and MIPs were obtained to evaluate the vascular anatomy.  CONTRAST:  23mL OMNIPAQUE  IOHEXOL 350 MG/ML SOLN  COMPARISON:  Chest radiograph March 28, 2014 and chest CT November 30, 2013  FINDINGS: There is no demonstrable pulmonary embolus. There is no thoracic aortic aneurysm or dissection. Atherosclerotic change is noted in the periphery of the descending thoracic aorta.  There is asymmetric pleural thickening in the right apex compared to the left, stable. There are areas of subtle opacity in the posterior segment of the right upper lobe.  There is a small pleural effusion on the left. There is consolidation in both medial lung bases as well as in the posterior and lateral segments of the right lower lobe.  There are small mediastinal lymph nodes without frank adenopathy. The pericardium is not thickened.  In the visualized upper abdomen, there is stable biliary duct air. Aorta is tortuous.  There is extensive degenerative change in the thoracic spine. There is a stimulator with the tip in the mid thoracic region and. There is thoracolumbar dextroscoliosis. There are no blastic or lytic bone lesions. Thyroid appears unremarkable.  Review of the MIP images confirms the above findings.  IMPRESSION: No demonstrable pulmonary embolus.  Several areas of patchy infiltrate with the greatest degree of infiltrate noted in the medial segments of both lower lobes and in the lateral and posterior segment right lower lobe. There is a small left effusion.  No adenopathy.  Air in the biliary ductal system is a stable finding. Suspect previous sphincterotomy as the etiology for this appearance.   Electronically Signed   By: Lowella Grip III M.D.   On: 03/28/2014 14:28   Dg Chest Port 1 View  03/29/2014   CLINICAL DATA:  Pneumonia.  Shortness of breath.  EXAM: PORTABLE CHEST - 1 VIEW  COMPARISON:  CT 03/28/2014.  Chest x-ray 03/28/2014 and 03/26/2014.  FINDINGS: Mediastinum and hilar structures are normal. Mild interstitial prominence noted bilaterally suggesting mild pneumonitis. No pleural effusion or  pneumothorax. Skin fold noted on the right. Heart size normal. Neurostimulator noted. No acute bony abnormality.  IMPRESSION: Mild interstitial prominence noted bilaterally suggesting pneumonitis.   Electronically Signed   By: Marcello Moores  Register   On: 03/29/2014 08:38   Dg Chest Port 1 View  03/28/2014   CLINICAL DATA:  Productive cough for 2 days, shortness of breath, congestion  EXAM: PORTABLE CHEST - 1 VIEW  COMPARISON:  03/18/2014  FINDINGS: Cardiomediastinal silhouette is stable. No acute infiltrate or pulmonary edema. Mild thoracic levoscoliosis. Spinal wires stimulator mid thoracic spine again noted.  IMPRESSION: No active disease.   Electronically Signed   By: Lahoma Crocker M.D.   On: 03/28/2014 12:16    Scheduled Meds: . arformoterol  15 mcg Nebulization BID  . aspirin  325 mg Oral Daily  . budesonide (PULMICORT) nebulizer solution  0.5 mg Nebulization BID  . calcium-vitamin D  2 tablet Oral BID  . docusate sodium  100 mg Oral BID  . enoxaparin (LOVENOX) injection  40 mg Subcutaneous Q24H  . famotidine  20 mg Oral BID  . feeding supplement (ENSURE COMPLETE)  237 mL Oral BID BM  . gabapentin  100 mg Oral BID  . guaiFENesin  1,200 mg Oral BID  . ipratropium-albuterol  3 mL Nebulization Q6H  . metoprolol  50 mg Oral BID  . oxyCODONE-acetaminophen  1 tablet Oral BID  . saccharomyces boulardii  250 mg Oral BID  . sodium chloride  3 mL Intravenous Q12H  . vitamin C  500 mg Oral Daily   Continuous Infusions:   Principal Problem:   HCAP (healthcare-associated pneumonia) Active Problems:   HYPERCHOLESTEROLEMIA   Heart failure   COPD with emphysema, gold stage C.   GERD   Peptic ulcer   Diaphragmatic hernia   Osteoporosis   Chronic back pain   Acute on chronic respiratory failure with hypoxia   Other pancytopenia   Malnutrition of moderate degree   [name] [name], [name] [name]  [facility] Pager [phone]. If 7PM-7AM, please contact night-coverage at [email or web address], password  [email or web address] 03/29/2014, 3:44 PM  LOS: 1 day

## 2014-03-30 ENCOUNTER — Telehealth: Payer: Self-pay | Admitting: Family

## 2014-03-30 ENCOUNTER — Encounter: Payer: Self-pay | Admitting: Family

## 2014-03-30 ENCOUNTER — Telehealth (HOSPITAL_COMMUNITY): Payer: Self-pay

## 2014-03-30 ENCOUNTER — Encounter: Payer: Self-pay | Admitting: Physician Assistant

## 2014-03-30 DIAGNOSIS — I5032 Chronic diastolic (congestive) heart failure: Secondary | ICD-10-CM

## 2014-03-30 DIAGNOSIS — I509 Heart failure, unspecified: Secondary | ICD-10-CM

## 2014-03-30 DIAGNOSIS — J432 Centrilobular emphysema: Secondary | ICD-10-CM

## 2014-03-30 DIAGNOSIS — R0602 Shortness of breath: Secondary | ICD-10-CM

## 2014-03-30 LAB — CBC
HCT: 33.7 % — ABNORMAL LOW (ref 36.0–46.0)
Hemoglobin: 10.6 g/dL — ABNORMAL LOW (ref 12.0–15.0)
MCH: 30.3 pg (ref 26.0–34.0)
MCHC: 31.5 g/dL (ref 30.0–36.0)
MCV: 96.3 fL (ref 78.0–100.0)
Platelets: 121 10*3/uL — ABNORMAL LOW (ref 150–400)
RBC: 3.5 MIL/uL — ABNORMAL LOW (ref 3.87–5.11)
RDW: 13.3 % (ref 11.5–15.5)
WBC: 2.5 10*3/uL — ABNORMAL LOW (ref 4.0–10.5)

## 2014-03-30 LAB — BASIC METABOLIC PANEL
Anion gap: 7 (ref 5–15)
BUN: 7 mg/dL (ref 6–23)
CO2: 31 mmol/L (ref 19–32)
Calcium: 8 mg/dL — ABNORMAL LOW (ref 8.4–10.5)
Chloride: 104 mmol/L (ref 96–112)
Creatinine, Ser: 0.65 mg/dL (ref 0.50–1.10)
GFR calc Af Amer: >90 mL/min (ref >90)
GFR calc non Af Amer: 78 mL/min — ABNORMAL LOW (ref >90)
Glucose, Bld: 99 mg/dL (ref 70–99)
Potassium: 3.9 mmol/L (ref 3.5–5.1)
Sodium: 142 mmol/L (ref 135–145)

## 2014-03-30 LAB — PROCALCITONIN: Procalcitonin: <0.1 ng/mL

## 2014-03-30 MED ORDER — BUDESONIDE-FORMOTEROL FUMARATE 160-4.5 MCG/ACT IN AERO: 2.0000 | INHALATION_SPRAY | Freq: Two times a day (BID) | RESPIRATORY_TRACT | Status: DC

## 2014-03-30 MED ORDER — ALBUTEROL SULFATE (2.5 MG/3ML) 0.083% IN NEBU: 3.0000 mL | INHALATION_SOLUTION | RESPIRATORY_TRACT | Status: DC | PRN

## 2014-03-30 MED ORDER — PREDNISONE 10 MG PO TABS: 30.0000 mg | ORAL_TABLET | Freq: Every day | ORAL | Status: DC

## 2014-03-30 MED ORDER — PREDNISONE 20 MG PO TABS
30.0000 mg | ORAL_TABLET | Freq: Every day | ORAL | Status: DC
  Administered 2014-03-30: 30 mg via ORAL
  Filled 2014-03-30 (×2): qty 1

## 2014-03-30 NOTE — Progress Notes (Signed)
Name: [name] [name] MRN: [id number] DOB: December 08, 1925    ADMISSION DATE:  03/28/2014 CONSULTATION DATE:  03/30/2014  REFERRING MD :  Algis Liming  CHIEF COMPLAINT:  SOB  BRIEF PATIENT DESCRIPTION: 81 y.o. F with PMH COPD GOLD III (Followed by PW), brought to [facility] ED 3/1 for SOB.  CT neg for PE but revealed bilateral infiltrates concerning for HCAP. PCCM consulted.  SIGNIFICANT EVENTS  2/2 - 2/19 hospitalized for septic shock due to pyelonephritis, colitis, CAP.  Discharged to [facility] 3/1 - admitted with presumed HCAP  STUDIES:  CTA Chest 3/1 >>> no PE, several areas of patchy infiltrate, greatest in medial segments of both LL's and lateral and posterior segment RLL.  Small left effusion.   SUBJECTIVE:  Breathing continues to improve.  Up to edge of bed this AM.  Still feels slightly congested but overall improved.  Family concerned about her outpatient bronchodilater regimen being PRN vs scheduled, have asked [place] to evaluate and make changes as deemed appropriate. PT recommending CIR for continued rehab.  VITAL SIGNS: Temp:  [97.4 F (36.3 C)-98.7 F (37.1 C)] 97.4 F (36.3 C) (03/03 3532) Pulse Rate:  [85-88] 88 (03/02 2106) Resp:  [16-20] 20 (03/03 0608) BP: (129-135)/(58-61) 135/58 mmHg (03/03 0608) SpO2:  [98 %-99 %] 98 % (03/03 0814)  PHYSICAL EXAMINATION: General: Elderly female, sitting on edge of bed, in NAD. Neuro: A&O x 3, non-focal.  HEENT: [place]/AT. PERRL, sclerae anicteric. Cardiovascular: RRR, no M/R/G.  Lungs: Respirations shallow.  Faint rhonchi, inspiratory crackles bilaterally - though improved. Abdomen: BS x 4, soft, NT/ND.  Musculoskeletal: No gross deformities, no edema.  Skin: Intact, warm, no rashes.   Recent Labs Lab 03/28/14 1144 03/29/14 0500 03/30/14 0515  NA 139 142 142  K 4.0 3.9 3.9  CL 101 104 104  CO2 30 32 31  BUN 8 8 7   CREATININE 0.75 0.68 0.65  GLUCOSE 104* 96 99    Recent Labs Lab 03/28/14 1144 03/29/14 0500 03/30/14 0515   HGB 11.7* 10.1* 10.6*  HCT 36.1 31.8* 33.7*  WBC 3.8* 2.4* 2.5*  PLT 120* 121* 121*   Ct Angio Chest Pe W/cm &/or Wo Cm  03/28/2014   CLINICAL DATA:  Chest pain and difficulty breathing  EXAM: CT ANGIOGRAPHY CHEST WITH CONTRAST  TECHNIQUE: Multidetector CT imaging of the chest was performed using the standard protocol during bolus administration of intravenous contrast. Multiplanar CT image reconstructions and MIPs were obtained to evaluate the vascular anatomy.  CONTRAST:  90mL OMNIPAQUE IOHEXOL 350 MG/ML SOLN  COMPARISON:  Chest radiograph March 28, 2014 and chest CT November 30, 2013  FINDINGS: There is no demonstrable pulmonary embolus. There is no thoracic aortic aneurysm or dissection. Atherosclerotic change is noted in the periphery of the descending thoracic aorta.  There is asymmetric pleural thickening in the right apex compared to the left, stable. There are areas of subtle opacity in the posterior segment of the right upper lobe. There is a small pleural effusion on the left. There is consolidation in both medial lung bases as well as in the posterior and lateral segments of the right lower lobe.  There are small mediastinal lymph nodes without frank adenopathy. The pericardium is not thickened.  In the visualized upper abdomen, there is stable biliary duct air. Aorta is tortuous.  There is extensive degenerative change in the thoracic spine. There is a stimulator with the tip in the mid thoracic region and. There is thoracolumbar dextroscoliosis. There are no blastic or lytic bone  lesions. Thyroid appears unremarkable.  Review of the MIP images confirms the above findings.  IMPRESSION: No demonstrable pulmonary embolus.  Several areas of patchy infiltrate with the greatest degree of infiltrate noted in the medial segments of both lower lobes and in the lateral and posterior segment right lower lobe. There is a small left effusion.  No adenopathy.  Air in the biliary ductal system is a stable  finding. Suspect previous sphincterotomy as the etiology for this appearance.   Electronically Signed   By: Lowella Grip III M.D.   On: 03/28/2014 14:28   Dg Chest Port 1 View  03/29/2014   CLINICAL DATA:  Pneumonia.  Shortness of breath.  EXAM: PORTABLE CHEST - 1 VIEW  COMPARISON:  CT 03/28/2014.  Chest x-ray 03/28/2014 and 03/26/2014.  FINDINGS: Mediastinum and hilar structures are normal. Mild interstitial prominence noted bilaterally suggesting mild pneumonitis. No pleural effusion or pneumothorax. Skin fold noted on the right. Heart size normal. Neurostimulator noted. No acute bony abnormality.  IMPRESSION: Mild interstitial prominence noted bilaterally suggesting pneumonitis.   Electronically Signed   By: Marcello Moores  Register   On: 03/29/2014 08:38   Dg Chest Port 1 View  03/28/2014   CLINICAL DATA:  Productive cough for 2 days, shortness of breath, congestion  EXAM: PORTABLE CHEST - 1 VIEW  COMPARISON:  03/18/2014  FINDINGS: Cardiomediastinal silhouette is stable. No acute infiltrate or pulmonary edema. Mild thoracic levoscoliosis. Spinal wires stimulator mid thoracic spine again noted.  IMPRESSION: No active disease.   Electronically Signed   By: Lahoma Crocker M.D.   On: 03/28/2014 12:16    ASSESSMENT / PLAN:  Acute on chronic hypoxic respiratory failure COPD ? HCAP vs viral illness - bilateral infiltrates on CTA chest but very subtle and not impressive.  Negative PCT noted and pt remains afebrile.  Favor viral Aspiration reoccurence? Recs: Continue supplemental O2 as needed to maintain SpO2 > 92%. Continue DuoNebs / Albuterol. Budesonide / Brovana in lieu of outpatient Symbicort. Pulmonary hygiene. Mobilize as able. Ambulatory desaturation study. Upon discharge, would d/c on scheduled BD's (symbicort) with PRN albuterol. [name] need close outpatient follow up upon discharge (schedule with Rexene Edison, NP - [facility]).  Chronic diastolic heart failure - Echo from 10/13/11:  EF  60-65%, grade 1 DD.  Normal BNP noted Recs: Repeat echo ordered.   Montey Hora, [facility] Pager: [phone]  or [phone] 03/30/2014, 10:33 AM  Attending:  I have seen and examined the patient with nurse practitioner/resident and agree with the note above.   Appears to be a mild exacerbation of COPD due to viral bronchitis, improving She is doing OK off of antibiotics  On exam: crackles bilaterally improved, some wheezing noted; no edema in legs  Impression/Plan AE COPD> I have added prednisone 30mg , would taper off over 7 days; as for her bronchodilators, [name] have respiratory come by to review HFA inhaler technique with a spacer.  If adequate then she can use symbicort 2 puffs bid at home. Chronic diastolic heart failure> f/u echo  Roselie Awkward, MD [facility] Pager: [phone] Cell: [phone] If no response, call [phone]

## 2014-03-30 NOTE — Progress Notes (Signed)
[name] [name] to be D/C'd Home per MD order.  Discussed with the patient and all questions fully answered.    Medication List    STOP taking these medications        levofloxacin 750 MG tablet  Commonly known as:  LEVAQUIN      TAKE these medications        acetaminophen 325 MG tablet  Commonly known as:  TYLENOL  Take 2 tablets (650 mg total) by mouth every 6 (six) hours as needed for mild pain (or Fever >/= 101).     albuterol 108 (90 BASE) MCG/ACT inhaler  Commonly known as:  PROVENTIL HFA;VENTOLIN HFA  Inhale 1-2 puffs into the lungs 3 (three) times daily as needed for wheezing or shortness of breath (wheezing & shortness of breath).     aspirin 325 MG tablet  Take 1 tablet (325 mg total) by mouth daily.     budesonide-formoterol 160-4.5 MCG/ACT inhaler  Commonly known as:  SYMBICORT  Inhale 2 puffs into the lungs 2 (two) times daily.     butalbital-acetaminophen-caffeine 50-325-40 MG per tablet  Commonly known as:  FIORICET, ESGIC  Take 1 tablet by mouth every 8 (eight) hours as needed for headache (headache).     calcium-vitamin D 500-200 MG-UNIT per tablet  Commonly known as:  OSCAL WITH D  Take 2 tablets by mouth 2 (two) times daily.     CALCIUM 600-D 600-400 MG-UNIT per tablet  Generic drug:  Calcium Carbonate-Vitamin D  Take 1 tablet by mouth 2 (two) times daily with a meal.     Clotrimazole 1 % Oint  Take 1 application by mouth 2 (two) times daily as needed (mouth sores).     gabapentin 100 MG capsule  Commonly known as:  NEURONTIN  Take 1 capsule (100 mg total) by mouth 2 (two) times daily.     guaiFENesin 600 MG 12 hr tablet  Commonly known as:  MUCINEX  Take 600 mg by mouth daily as needed.     lactulose 10 GM/15ML solution  Commonly known as:  CHRONULAC  Take 10 g by mouth daily as needed for mild constipation (constipation). 10 ml by mouth once daily at bedtime     metoprolol 50 MG tablet  Commonly known as:  LOPRESSOR  Take 1 tablet (50 mg  total) by mouth 2 (two) times daily.     nitroGLYCERIN 0.4 MG SL tablet  Commonly known as:  NITROSTAT  Place 1 tablet (0.4 mg total) under the tongue every 5 (five) minutes as needed for chest pain.     omeprazole 40 MG capsule  Commonly known as:  PRILOSEC  Take 1 capsule (40 mg total) by mouth 2 (two) times daily.     ondansetron 4 MG tablet  Commonly known as:  ZOFRAN  Take 1 tablet (4 mg total) by mouth every 6 (six) hours as needed for nausea.     oxyCODONE-acetaminophen 5-325 MG per tablet  Commonly known as:  PERCOCET/ROXICET  Take 1 tablet by mouth every 6 (six) hours as needed for moderate pain or severe pain (pain).     polyethylene glycol packet  Commonly known as:  MIRALAX / GLYCOLAX  Take 17 g by mouth daily as needed for mild constipation or moderate constipation.     predniSONE 10 MG tablet  Commonly known as:  DELTASONE  Take 3 tablets (30 mg total) by mouth daily with breakfast.     PROBIOTIC DAILY PO  Take 1 tablet by  mouth daily.     saccharomyces boulardii 250 MG capsule  Commonly known as:  FLORASTOR  Take 1 capsule (250 mg total) by mouth 2 (two) times daily.     vitamin C 500 MG tablet  Commonly known as:  ASCORBIC ACID  Take 500 mg by mouth daily.     VITAMIN E PO  Take 1 tablet by mouth daily.        VVS, Skin clean, dry and intact without evidence of skin break down, no evidence of skin tears noted. IV catheter discontinued intact. Site without signs and symptoms of complications. Dressing and pressure applied.  An After Visit Summary was printed and given to the patient's daughter.  D/c education completed with patient/family ([name]) including follow up instructions, medication list, d/c activities limitations if indicated, with other d/c instructions as indicated by MD - patient/family able to verbalize understanding, all questions fully answered.   Patient instructed to return to ED, call 911, or call MD for any changes in condition.    Patient escorted via [place], and D/C home via private auto.  [name] [name] 03/30/2014

## 2014-03-30 NOTE — Telephone Encounter (Signed)
Post ED Visit - Positive Culture Follow-up  Culture report reviewed by antimicrobial stewardship pharmacist: []  Wes Dulaney, Pharm.D., BCPS [x]  Heide Guile, Pharm.D., BCPS []  Alycia Rossetti, Pharm.D., BCPS []  [place], Pharm.D., BCPS, AAHIVP []  Legrand Como, Pharm.D., BCPS, AAHIVP []  Isac Sarna, Pharm.D., BCPS  Positive Urine culture, >/= 100,000 colonies -> E Coli & Klebsiella Oxytoca Treated with Levofloxacin, organism sensitive to the same and no further patient follow-up is required at this time.  Dortha Kern 03/30/2014, 9:24 AM

## 2014-03-30 NOTE — Telephone Encounter (Signed)
Pt was no show for hospital follow up on 03/28/2014. Letter has been sent/ I attempted to call to reschedule with no answer. Charge no show?
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
## 2014-03-30 NOTE — Progress Notes (Signed)
Thank you for consult on Ms. Carvin. Patient examined with family present (former rehab nurse) who reports that she has been walking patient to the bathroom and out in the hall. Patient continues to limited by respiratory status requiring oxygen since early Feb. Family is able to provide 24 hours supervision and recommend follow up [facility] therapy past discharge.

## 2014-03-30 NOTE — Progress Notes (Signed)
Echocardiogram 2D Echocardiogram has been performed.  Joelene Millin 03/30/2014, 11:28 AM

## 2014-03-30 NOTE — Telephone Encounter (Signed)
Looking at her chart, it seems she didn't make it to her follow-up because she has been admitted to the hospital.  No charge.

## 2014-03-30 NOTE — Progress Notes (Signed)
Patient will be going to address [address] [place] [address], phone is [phone].

## 2014-03-30 NOTE — Progress Notes (Signed)
NCM spoke with Bronson Curb , grand daughter, she states they are active with [facility] for RN, PT, OT and would like to continue.  [place] notified and patient is for dc today.  Soc will begin 24-48 hrs post dc.

## 2014-03-30 NOTE — Telephone Encounter (Signed)
Noted  

## 2014-03-30 NOTE — Clinical Social Work Note (Signed)
Patient not a CIR candidate. Patient able to return home with HHPT at discharge. Per MD discharge is for today.  CSW signing off at this time.  Liz Beach MSW, [place], [place], [phone]

## 2014-03-31 NOTE — Telephone Encounter (Signed)
Please arrange hospital follow up.  

## 2014-03-31 NOTE — Telephone Encounter (Signed)
Left a message for call back.  

## 2014-04-02 DIAGNOSIS — G8929 Other chronic pain: Secondary | ICD-10-CM | POA: Diagnosis not present

## 2014-04-02 DIAGNOSIS — M810 Age-related osteoporosis without current pathological fracture: Secondary | ICD-10-CM | POA: Diagnosis not present

## 2014-04-02 DIAGNOSIS — K5100 Ulcerative (chronic) pancolitis without complications: Secondary | ICD-10-CM | POA: Diagnosis not present

## 2014-04-02 DIAGNOSIS — Z8701 Personal history of pneumonia (recurrent): Secondary | ICD-10-CM | POA: Diagnosis not present

## 2014-04-02 DIAGNOSIS — J441 Chronic obstructive pulmonary disease with (acute) exacerbation: Secondary | ICD-10-CM | POA: Diagnosis not present

## 2014-04-02 DIAGNOSIS — I509 Heart failure, unspecified: Secondary | ICD-10-CM | POA: Diagnosis not present

## 2014-04-03 LAB — CULTURE, BLOOD (ROUTINE X 2)
Culture: NO GROWTH
Culture: NO GROWTH

## 2014-04-04 ENCOUNTER — Telehealth: Payer: Self-pay | Admitting: Critical Care Medicine

## 2014-04-04 DIAGNOSIS — M810 Age-related osteoporosis without current pathological fracture: Secondary | ICD-10-CM | POA: Diagnosis not present

## 2014-04-04 DIAGNOSIS — Z8701 Personal history of pneumonia (recurrent): Secondary | ICD-10-CM | POA: Diagnosis not present

## 2014-04-04 DIAGNOSIS — J441 Chronic obstructive pulmonary disease with (acute) exacerbation: Secondary | ICD-10-CM | POA: Diagnosis not present

## 2014-04-04 DIAGNOSIS — K5100 Ulcerative (chronic) pancolitis without complications: Secondary | ICD-10-CM | POA: Diagnosis not present

## 2014-04-04 DIAGNOSIS — I509 Heart failure, unspecified: Secondary | ICD-10-CM | POA: Diagnosis not present

## 2014-04-04 DIAGNOSIS — G8929 Other chronic pain: Secondary | ICD-10-CM | POA: Diagnosis not present

## 2014-04-04 NOTE — Telephone Encounter (Signed)
Spoke with [name] with [facility]  She is calling to let us know that they have made several attempts to deliver o2 to pt's home and the pt cancels each time  Will forward to PW as FYI

## 2014-04-04 NOTE — Telephone Encounter (Signed)
[name] [name]  Find out what the issue is with the pt please

## 2014-04-05 ENCOUNTER — Ambulatory Visit: Payer: Medicare Other | Admitting: Critical Care Medicine

## 2014-04-05 NOTE — Telephone Encounter (Signed)
ATC pt at [phone], line rang multiple times with NA and no option to leave msg.  WCB

## 2014-04-06 DIAGNOSIS — G8929 Other chronic pain: Secondary | ICD-10-CM | POA: Diagnosis not present

## 2014-04-06 DIAGNOSIS — M810 Age-related osteoporosis without current pathological fracture: Secondary | ICD-10-CM | POA: Diagnosis not present

## 2014-04-06 DIAGNOSIS — Z8701 Personal history of pneumonia (recurrent): Secondary | ICD-10-CM | POA: Diagnosis not present

## 2014-04-06 DIAGNOSIS — K5100 Ulcerative (chronic) pancolitis without complications: Secondary | ICD-10-CM | POA: Diagnosis not present

## 2014-04-06 DIAGNOSIS — I509 Heart failure, unspecified: Secondary | ICD-10-CM | POA: Diagnosis not present

## 2014-04-06 DIAGNOSIS — J441 Chronic obstructive pulmonary disease with (acute) exacerbation: Secondary | ICD-10-CM | POA: Diagnosis not present

## 2014-04-06 NOTE — Telephone Encounter (Signed)
Spoke with [facility] - aware of below. Patient to be notified today. Nothing further needed.

## 2014-04-06 NOTE — Telephone Encounter (Signed)
Order placed for the following:  [facility] - home fill 02 system and smaller portable 02 tanks  ------  Spoke with pt.  Pt states she's been in the hospital.  This is the reason she has not been able to let [facility] deliver o2.  Pt is now at home and would like to proceed with [facility] delivering o2.  Pt aware I will ask [facility] to call her back to schedule.  She verbalized understanding and is in agreement with this plan.   lmomtcb for [name] with [facility]   Regarding hospital admission, pt d/c'd on 03/30/14.  Was admitted for pna.  Pt has a pending HFU with TP tomorrow at 2:15.  Asked pt to please keep this appt. She verbalized understanding and is in agreement with plan.

## 2014-04-07 ENCOUNTER — Inpatient Hospital Stay: Payer: Medicare Other | Admitting: Adult Health

## 2014-04-10 DIAGNOSIS — I509 Heart failure, unspecified: Secondary | ICD-10-CM | POA: Diagnosis not present

## 2014-04-10 DIAGNOSIS — G8929 Other chronic pain: Secondary | ICD-10-CM | POA: Diagnosis not present

## 2014-04-10 DIAGNOSIS — M810 Age-related osteoporosis without current pathological fracture: Secondary | ICD-10-CM | POA: Diagnosis not present

## 2014-04-10 DIAGNOSIS — J441 Chronic obstructive pulmonary disease with (acute) exacerbation: Secondary | ICD-10-CM | POA: Diagnosis not present

## 2014-04-10 DIAGNOSIS — K5100 Ulcerative (chronic) pancolitis without complications: Secondary | ICD-10-CM | POA: Diagnosis not present

## 2014-04-10 DIAGNOSIS — Z8701 Personal history of pneumonia (recurrent): Secondary | ICD-10-CM | POA: Diagnosis not present

## 2014-04-11 ENCOUNTER — Telehealth: Payer: Self-pay | Admitting: *Deleted

## 2014-04-11 MED ORDER — METOPROLOL TARTRATE 50 MG PO TABS: 50.0000 mg | ORAL_TABLET | Freq: Two times a day (BID) | ORAL | Status: DC

## 2014-04-11 NOTE — Telephone Encounter (Signed)
Received fax request from [facility] for metoprolol 50mg  twice a day. Pt has follow up with PA, Hassell Done on 04/19/14. 30 day supply sent to pharmacy as pt is due for follow up.

## 2014-04-12 ENCOUNTER — Ambulatory Visit (INDEPENDENT_AMBULATORY_CARE_PROVIDER_SITE_OTHER): Payer: Medicare Other | Admitting: Critical Care Medicine

## 2014-04-12 ENCOUNTER — Ambulatory Visit (INDEPENDENT_AMBULATORY_CARE_PROVIDER_SITE_OTHER)
Admission: RE | Admit: 2014-04-12 | Discharge: 2014-04-12 | Disposition: A | Discharge status: Home / Self Care | Payer: Medicare Other | Source: Ambulatory Visit | Attending: Critical Care Medicine | Admitting: Critical Care Medicine

## 2014-04-12 ENCOUNTER — Encounter: Payer: Self-pay | Admitting: Critical Care Medicine

## 2014-04-12 VITALS — BP 134/76 | HR 80 | Temp 98.4°F | Ht 60.0 in | Wt 112.0 lb

## 2014-04-12 DIAGNOSIS — J189 Pneumonia, unspecified organism: Secondary | ICD-10-CM

## 2014-04-12 DIAGNOSIS — J432 Centrilobular emphysema: Secondary | ICD-10-CM | POA: Diagnosis not present

## 2014-04-12 DIAGNOSIS — R05 Cough: Secondary | ICD-10-CM | POA: Diagnosis not present

## 2014-04-12 MED ORDER — LEVOFLOXACIN 500 MG PO TABS: 500.0000 mg | ORAL_TABLET | Freq: Every day | ORAL | Status: DC

## 2014-04-12 NOTE — Progress Notes (Signed)
Subjective:    Patient ID: [name] [name], female    DOB: [birth year], 81 y.o.   MRN: [id number]  HPI 04/12/2014 Chief Complaint  Patient presents with  . Follow-up    Last seen 12/05/2013.D/C from hospital 2 weeks ago. C/O increased SOB, productive cough with yellow mucus, wheezing, chest tightness. States low grade fever yesterday. Denis n/v, hemoptysis, edema   Pt just d/c from hosp two weeks ago.   Hcap. Adm 3/1 - 3/3  Rx levaquin.  No diarrhea with this Previously adm 2/3-2/11 for pancolitis and CAP.  Rx abx and better.  kleibsiella on Urine rx as well.  Now coughing yellow mucus, no n/v. No diarrhea, notes some wheezing, and chest tightness      Review of Systems Constitutional:   No  weight loss, night sweats,  Fevers, chills, fatigue, lassitude. HEENT:   No headaches,  Difficulty swallowing,  Tooth/dental problems,  Sore throat,                No sneezing, itching, ear ache, nasal congestion, post nasal drip,   CV:  No chest pain,  Orthopnea, PND, swelling in lower extremities, anasarca, dizziness, palpitations  GI  No heartburn, indigestion, abdominal pain, nausea, vomiting, diarrhea, change in bowel habits, loss of appetite  Resp: Notes  shortness of breath with exertion not at rest.  No excess mucus, no productive cough,  No non-productive cough,  No coughing up of blood.  No change in color of mucus.  No wheezing.  No chest wall deformity  Skin: no rash or lesions.  GU: no dysuria, change in color of urine, no urgency or frequency.  No flank pain.  MS:  No joint pain or swelling.  No decreased range of motion.  No back pain.  Psych:  No change in mood or affect. No depression or anxiety.  No memory loss.     Objective:   Physical Exam Filed Vitals:   04/12/14 1107  BP: 134/76  Pulse: 80  Temp: 98.4 F (36.9 C)  TempSrc: Oral  Height: 5' (1.524 m)  Weight: 112 lb (50.803 kg)  SpO2: 97%    Gen: Pleasant, well-nourished, in no distress,  normal  affect  ENT: No lesions,  mouth clear,  oropharynx clear, no postnasal drip  Neck: No JVD, no TMG, no carotid bruits  Lungs: No use of accessory muscles, no dullness to percussion, distant BS  Cardiovascular: RRR, heart sounds normal, no murmur or gallops, no peripheral edema  Abdomen: soft and NT, no HSM,  BS normal  Musculoskeletal: No deformities, no cyanosis or clubbing  Neuro: alert, non focal  Skin: Warm, no lesions or rashes  Dg Chest 2 View  04/12/2014   CLINICAL DATA:  Followup pneumonia, continued cough.  EXAM: CHEST  2 VIEW  COMPARISON:  03/29/2014  FINDINGS: No confluent airspace opacities or effusions. Heart is normal size. Spinal stimulator wires noted in place. No acute bony abnormality.  IMPRESSION: No active cardiopulmonary disease.   Electronically Signed   By: Rolm Baptise M.D.   On: 04/12/2014 15:45          Assessment & Plan:   COPD with emphysema, gold stage C. Recurrent HCAP and copd exac now with residual bronchitis but cxr clear for pna Current CXR clear Plan No change in inhaled meds Take levaquin one daily for 5 days No change in medications Return 2 months      Updated Medication List Outpatient Encounter Prescriptions as of 04/12/2014  Medication Sig  .  acetaminophen (TYLENOL) 325 MG tablet Take 2 tablets (650 mg total) by mouth every 6 (six) hours as needed for mild pain (or Fever >/= 101).  [name] albuterol (PROVENTIL HFA;VENTOLIN HFA) 108 (90 BASE) MCG/ACT inhaler Inhale 1-2 puffs into the lungs 3 (three) times daily as needed for wheezing or shortness of breath (wheezing & shortness of breath).   . Ascorbic Acid (VITAMIN C) 500 MG tablet Take 500 mg by mouth daily.    [name] aspirin 325 MG tablet Take 1 tablet (325 mg total) by mouth daily.  . budesonide-formoterol (SYMBICORT) 160-4.5 MCG/ACT inhaler Inhale 2 puffs into the lungs 2 (two) times daily.  . butalbital-acetaminophen-caffeine (FIORICET, ESGIC) 50-325-40 MG per tablet Take 1 tablet by  mouth every 8 (eight) hours as needed for headache (headache).   . Calcium Carbonate-Vitamin D (CALCIUM 600-D) 600-400 MG-UNIT per tablet Take 1 tablet by mouth 2 (two) times daily with a meal.    . calcium-vitamin D (OSCAL WITH D) 500-200 MG-UNIT per tablet Take 2 tablets by mouth 2 (two) times daily.  . Clotrimazole 1 % OINT Take 1 application by mouth 2 (two) times daily as needed (mouth sores).   . gabapentin (NEURONTIN) 100 MG capsule Take 1 capsule (100 mg total) by mouth 2 (two) times daily.  [name] guaiFENesin (MUCINEX) 600 MG 12 hr tablet Take 600 mg by mouth daily as needed.  . lactulose (CHRONULAC) 10 GM/15ML solution Take 10 g by mouth daily as needed for mild constipation (constipation). 10 ml by mouth once daily at bedtime  . metoprolol (LOPRESSOR) 50 MG tablet Take 1 tablet (50 mg total) by mouth 2 (two) times daily.  . nitroGLYCERIN (NITROSTAT) 0.4 MG SL tablet Place 1 tablet (0.4 mg total) under the tongue every 5 (five) minutes as needed for chest pain.  [name] omeprazole (PRILOSEC) 40 MG capsule Take 1 capsule (40 mg total) by mouth 2 (two) times daily.  . ondansetron (ZOFRAN) 4 MG tablet Take 1 tablet (4 mg total) by mouth every 6 (six) hours as needed for nausea.  [name] oxyCODONE-acetaminophen (PERCOCET/ROXICET) 5-325 MG per tablet Take 1 tablet by mouth every 6 (six) hours as needed for moderate pain or severe pain (pain). (Patient taking differently: Take 1 tablet by mouth 2 (two) times daily. )  . polyethylene glycol (MIRALAX / GLYCOLAX) packet Take 17 g by mouth daily as needed for mild constipation or moderate constipation.  [name] VITAMIN E PO Take 1 tablet by mouth daily.   [name] levofloxacin (LEVAQUIN) 500 MG tablet Take 1 tablet (500 mg total) by mouth daily.  . [DISCONTINUED] predniSONE (DELTASONE) 10 MG tablet Take 3 tablets (30 mg total) by mouth daily with breakfast.  . [DISCONTINUED] Probiotic Product (PROBIOTIC DAILY PO) Take 1 tablet by mouth daily.  . [DISCONTINUED] saccharomyces  boulardii (FLORASTOR) 250 MG capsule Take 1 capsule (250 mg total) by mouth 2 (two) times daily. (Patient not taking: Reported on 04/12/2014)

## 2014-04-12 NOTE — Patient Instructions (Signed)
We will try to get a portable concentrator for the pt Take levaquin one daily for 5 days No change in medications Return 2 months

## 2014-04-13 NOTE — Progress Notes (Signed)
Quick Note:  Spoke with pt. Discussed cxr results and recs per Dr. Joya Gaskins. She verbalized understanding and voiced no further questions or concerns at this time. ______

## 2014-04-13 NOTE — Assessment & Plan Note (Signed)
Recurrent HCAP and copd exac now with residual bronchitis but cxr clear for pna Current CXR clear Plan No change in inhaled meds Take levaquin one daily for 5 days No change in medications Return 2 months

## 2014-04-14 ENCOUNTER — Telehealth: Payer: Self-pay | Admitting: Critical Care Medicine

## 2014-04-14 NOTE — Telephone Encounter (Signed)
noted 

## 2014-04-14 NOTE — Telephone Encounter (Signed)
Deanna from [facility] called to let us know that patient is refusing nurse help.  She attempted to go by yesterday and the patient told her that she did not need a nurse visit, then today, she told her she could not come by because she has to go get her nails done.  FYI to Dr. Joya Gaskins.

## 2014-04-17 ENCOUNTER — Other Ambulatory Visit: Payer: Self-pay | Admitting: *Deleted

## 2014-04-18 ENCOUNTER — Telehealth: Payer: Self-pay | Admitting: Critical Care Medicine

## 2014-04-18 DIAGNOSIS — M810 Age-related osteoporosis without current pathological fracture: Secondary | ICD-10-CM | POA: Diagnosis not present

## 2014-04-18 DIAGNOSIS — I509 Heart failure, unspecified: Secondary | ICD-10-CM | POA: Diagnosis not present

## 2014-04-18 DIAGNOSIS — G8929 Other chronic pain: Secondary | ICD-10-CM | POA: Diagnosis not present

## 2014-04-18 DIAGNOSIS — J441 Chronic obstructive pulmonary disease with (acute) exacerbation: Secondary | ICD-10-CM | POA: Diagnosis not present

## 2014-04-18 DIAGNOSIS — Z8701 Personal history of pneumonia (recurrent): Secondary | ICD-10-CM | POA: Diagnosis not present

## 2014-04-18 DIAGNOSIS — K5100 Ulcerative (chronic) pancolitis without complications: Secondary | ICD-10-CM | POA: Diagnosis not present

## 2014-04-18 NOTE — Telephone Encounter (Signed)
Called and spoke to [place], [facility] RN. Pt is refusing all nurse visit if more than once per week. Deanna is requesting a VO to change nurse visits to once a week.   Dr. Joya Gaskins please advise.

## 2014-04-19 ENCOUNTER — Ambulatory Visit (INDEPENDENT_AMBULATORY_CARE_PROVIDER_SITE_OTHER): Payer: Medicare Other | Admitting: Physician Assistant

## 2014-04-19 ENCOUNTER — Encounter: Payer: Self-pay | Admitting: Physician Assistant

## 2014-04-19 VITALS — BP 123/53 | HR 78 | Temp 98.6°F | Resp 14 | Ht 60.0 in | Wt 108.1 lb

## 2014-04-19 DIAGNOSIS — J3089 Other allergic rhinitis: Secondary | ICD-10-CM

## 2014-04-19 DIAGNOSIS — R350 Frequency of micturition: Secondary | ICD-10-CM

## 2014-04-19 DIAGNOSIS — J449 Chronic obstructive pulmonary disease, unspecified: Secondary | ICD-10-CM | POA: Insufficient documentation

## 2014-04-19 DIAGNOSIS — J441 Chronic obstructive pulmonary disease with (acute) exacerbation: Secondary | ICD-10-CM | POA: Diagnosis not present

## 2014-04-19 LAB — POCT URINALYSIS DIPSTICK
Bilirubin, UA: NEGATIVE
Blood, UA: NEGATIVE
Glucose, UA: NEGATIVE
Ketones, UA: NEGATIVE
Leukocytes, UA: NEGATIVE
Nitrite, UA: NEGATIVE
Protein, UA: NEGATIVE
Spec Grav, UA: 1.025
Urobilinogen, UA: NEGATIVE
pH, UA: 6

## 2014-04-19 MED ORDER — FLUTICASONE PROPIONATE 50 MCG/ACT NA SUSP: 2.0000 | Freq: Every day | NASAL | Status: DC

## 2014-04-19 NOTE — Patient Instructions (Addendum)
Please continue your chronic medications as directed. Stay well hydrated. Your appetite should return over the next week. Drink 1 ensure daily. Use the Flonase daily as directed. Place a humidifier in the bedroom.  Follow-up with Dr. Joya Gaskins as scheduled. Follow-up with [name] as scheduled. Return sooner if needed.

## 2014-04-19 NOTE — Telephone Encounter (Signed)
[name] [name] is aware that pt can have weekly RN visits. Nothing further was needed.

## 2014-04-19 NOTE — Progress Notes (Signed)
Pre visit review using our clinic review tool, if applicable. No additional management support is needed unless otherwise documented below in the visit note/SLS  

## 2014-04-19 NOTE — Telephone Encounter (Signed)
Weekly RN visits OK

## 2014-04-20 NOTE — Patient Outreach (Signed)
   04/20/2014   [name] [name] 01-17-1926 [id number]   RN spoke with pt today who confirmed [facility] did not visit on last week however will remain in contact for next home visit.  Pt cont. to reports she remains in the GREEN zone with her COPD action plan. RN cont. to offer home visits for [facility] services and possible plan of care to increase pt's knowledge concerning her medical condition. Pt cont. to refused home visits but appreciates the ongoing follow up calls. Plan to follow up with pt next week with ongoing transition of care contact (last call).   Raina Mina, RN Care Management Coordinator [facility] Main Office [phone]

## 2014-04-26 DIAGNOSIS — G8929 Other chronic pain: Secondary | ICD-10-CM | POA: Diagnosis not present

## 2014-04-26 DIAGNOSIS — J441 Chronic obstructive pulmonary disease with (acute) exacerbation: Secondary | ICD-10-CM | POA: Diagnosis not present

## 2014-04-26 DIAGNOSIS — I509 Heart failure, unspecified: Secondary | ICD-10-CM | POA: Diagnosis not present

## 2014-04-26 DIAGNOSIS — K5100 Ulcerative (chronic) pancolitis without complications: Secondary | ICD-10-CM | POA: Diagnosis not present

## 2014-04-26 DIAGNOSIS — M810 Age-related osteoporosis without current pathological fracture: Secondary | ICD-10-CM | POA: Diagnosis not present

## 2014-04-26 DIAGNOSIS — Z8701 Personal history of pneumonia (recurrent): Secondary | ICD-10-CM | POA: Diagnosis not present

## 2014-05-01 ENCOUNTER — Other Ambulatory Visit: Payer: Self-pay | Admitting: *Deleted

## 2014-05-04 ENCOUNTER — Telehealth: Payer: Self-pay | Admitting: Critical Care Medicine

## 2014-05-04 NOTE — Telephone Encounter (Signed)
noted 

## 2014-05-04 NOTE — Telephone Encounter (Signed)
Will route message to PW to let him know.

## 2014-05-09 DIAGNOSIS — M5136 Other intervertebral disc degeneration, lumbar region: Secondary | ICD-10-CM | POA: Diagnosis not present

## 2014-05-09 DIAGNOSIS — Z9689 Presence of other specified functional implants: Secondary | ICD-10-CM | POA: Diagnosis not present

## 2014-05-09 DIAGNOSIS — R51 Headache: Secondary | ICD-10-CM | POA: Diagnosis not present

## 2014-05-09 DIAGNOSIS — M545 Low back pain: Secondary | ICD-10-CM | POA: Diagnosis not present

## 2014-05-11 DIAGNOSIS — G8929 Other chronic pain: Secondary | ICD-10-CM | POA: Diagnosis not present

## 2014-05-11 DIAGNOSIS — K5100 Ulcerative (chronic) pancolitis without complications: Secondary | ICD-10-CM | POA: Diagnosis not present

## 2014-05-11 DIAGNOSIS — Z8701 Personal history of pneumonia (recurrent): Secondary | ICD-10-CM | POA: Diagnosis not present

## 2014-05-11 DIAGNOSIS — J441 Chronic obstructive pulmonary disease with (acute) exacerbation: Secondary | ICD-10-CM | POA: Diagnosis not present

## 2014-05-11 DIAGNOSIS — I509 Heart failure, unspecified: Secondary | ICD-10-CM | POA: Diagnosis not present

## 2014-05-11 DIAGNOSIS — M810 Age-related osteoporosis without current pathological fracture: Secondary | ICD-10-CM | POA: Diagnosis not present

## 2014-05-14 NOTE — Assessment & Plan Note (Signed)
Supportive measures discussed.  Saline nasal spray as directed.  Humidifier in bedroom.

## 2014-05-14 NOTE — Progress Notes (Signed)
Patient presents to clinic today for hospital follow-up of COPD. Patient admitted to hospital on 03/28/14 to r/o HCAP. Workup with CXR ruled out pneumonia.  Patient was treated for COPD exacerbation with breathing treatments and steroids. Since discharge patient endorses doing much better.  Still with mild non-productive cough and runny nose.  Denies fever, chills, chest congestion, chest pain or SOB. Is taking medications as directed.    Past Medical History  Diagnosis Date  . Osteoporosis   . GERD (gastroesophageal reflux disease)   . Hiatal hernia   . PUD (peptic ulcer disease)   . Cardiac arrhythmia   . Hyperlipidemia   . Hypertension   . Scoliosis deformity of spine     history of intractable back pain  . Choledocholithiasis   . Pancolitis     Infectious vs. inflammatory  . Abdominal pain     Resolved  . Chronic anemia   . Chronic back pain   . Pancreatitis     History of  . CHF (congestive heart failure)     Diastolic  on ECHO 4132  . COPD (chronic obstructive pulmonary disease)     oxygen dependent at  night 2LPM  . PONV (postoperative nausea and vomiting)   . Reactive airway disease that is not asthma   . HCAP (healthcare-associated pneumonia) 03/28/2014  . Pneumonia 1930's; 2011 X 2; 02/2014  . On home oxygen therapy     "1L during the day; 2L q hs" (03/28/2014)  . Daily headache   . Osteoarthritis     "pretty much q where"   . Recurrent UTI (urinary tract infection)     "here lately" (03/28/2014)    Current Outpatient Prescriptions on File Prior to Visit  Medication Sig Dispense Refill  . acetaminophen (TYLENOL) 325 MG tablet Take 2 tablets (650 mg total) by mouth every 6 (six) hours as needed for mild pain (or Fever >/= 101). 30 tablet 0  . albuterol (PROVENTIL HFA;VENTOLIN HFA) 108 (90 BASE) MCG/ACT inhaler Inhale 1-2 puffs into the lungs 3 (three) times daily as needed for wheezing or shortness of breath (wheezing & shortness of breath).     . Ascorbic Acid  (VITAMIN C) 500 MG tablet Take 500 mg by mouth daily.      [name] aspirin 325 MG tablet Take 1 tablet (325 mg total) by mouth daily. 30 tablet 0  . budesonide-formoterol (SYMBICORT) 160-4.5 MCG/ACT inhaler Inhale 2 puffs into the lungs 2 (two) times daily. 1 Inhaler 0  . butalbital-acetaminophen-caffeine (FIORICET, ESGIC) 50-325-40 MG per tablet Take 1 tablet by mouth every 8 (eight) hours as needed for headache (headache).     . Calcium Carbonate-Vitamin D (CALCIUM 600-D) 600-400 MG-UNIT per tablet Take 1 tablet by mouth 2 (two) times daily with a meal.      . calcium-vitamin D (OSCAL WITH D) 500-200 MG-UNIT per tablet Take 2 tablets by mouth 2 (two) times daily.    . Clotrimazole 1 % OINT Take 1 application by mouth 2 (two) times daily as needed (mouth sores).     . gabapentin (NEURONTIN) 100 MG capsule Take 1 capsule (100 mg total) by mouth 2 (two) times daily. 60 capsule 3  . guaiFENesin (MUCINEX) 600 MG 12 hr tablet Take 600 mg by mouth daily as needed.    . lactulose (CHRONULAC) 10 GM/15ML solution Take 10 g by mouth daily as needed for mild constipation (constipation). 10 ml by mouth once daily at bedtime    . metoprolol (LOPRESSOR) 50  MG tablet Take 1 tablet (50 mg total) by mouth 2 (two) times daily. 60 tablet 0  . nitroGLYCERIN (NITROSTAT) 0.4 MG SL tablet Place 1 tablet (0.4 mg total) under the tongue every 5 (five) minutes as needed for chest pain. 100 tablet 3  . omeprazole (PRILOSEC) 40 MG capsule Take 1 capsule (40 mg total) by mouth 2 (two) times daily. 60 capsule 3  . ondansetron (ZOFRAN) 4 MG tablet Take 1 tablet (4 mg total) by mouth every 6 (six) hours as needed for nausea. 20 tablet 0  . oxyCODONE-acetaminophen (PERCOCET/ROXICET) 5-325 MG per tablet Take 1 tablet by mouth every 6 (six) hours as needed for moderate pain or severe pain (pain). (Patient taking differently: Take 1 tablet by mouth 2 (two) times daily. ) 30 tablet 0  . polyethylene glycol (MIRALAX / GLYCOLAX) packet Take 17  g by mouth daily as needed for mild constipation or moderate constipation. 14 each 0  . VITAMIN E PO Take 1 tablet by mouth daily.      No current facility-administered medications on file prior to visit.    Allergies  Allergen Reactions  . Codeine     REACTION: n/v/d, HA  . Morphine     REACTION: n/v/d, HA  . Sulfa Antibiotics     Sick    . Zoledronic Acid     REACTION: Severe edema    Family History  Problem Relation Age of Onset  . Cancer Mother     uterine  . Heart disease Father   . Hypertension Other     History   Social History  . Marital Status: Widowed    Spouse Name: N/A  . Number of Children: 3  . Years of Education: N/A   Occupational History  .     Social History Main Topics  . Smoking status: Former Smoker -- 1.00 packs/day for 12 years    Types: Cigarettes    Quit date: 01/28/1988  . Smokeless tobacco: Never Used  . Alcohol Use: No  . Drug Use: No  . Sexual Activity: No   Other Topics Concern  . None   Social History Narrative   1 grandchild at care link--Angelia   Lives with great-granddaughter    Review of Systems - See HPI.  All other ROS are negative.  BP 123/53 mmHg  Pulse 78  Temp(Src) 98.6 F (37 C) (Oral)  Resp 14  Ht 5' (1.524 m)  Wt 108 lb 2 oz (49.045 kg)  BMI 21.12 kg/m2  SpO2 97%  Physical Exam  Constitutional: She is oriented to person, place, and time and well-developed, well-nourished, and in no distress.  HENT:  Head: Normocephalic and atraumatic.  Right Ear: External ear normal.  Left Ear: External ear normal.  Nose: Nose normal.  Mouth/Throat: Oropharynx is clear and moist.  TM within normal limits bilaterally.  Eyes: Conjunctivae are normal.  Neck: Neck supple.  Cardiovascular: Normal rate, regular rhythm, normal heart sounds and intact distal pulses.   Pulmonary/Chest: Effort normal and breath sounds normal. No respiratory distress. She has no wheezes. She has no rales. She exhibits no tenderness.    Neurological: She is alert and oriented to person, place, and time.  Skin: Skin is warm and dry. No rash noted.  Vitals reviewed.   Recent Results (from the past 2160 hour(s))  Blood Culture (routine x 2)     Status: None   Collection Time: 02/28/14  5:25 PM  Result Value Ref Range   Specimen Description  BLOOD RIGHT WRIST    Special Requests BOTTLES DRAWN AEROBIC ONLY 3ML    Culture      NO GROWTH 5 DAYS Performed at [facility]    Report Status 03/07/2014 FINAL   CBC WITH DIFFERENTIAL     Status: Abnormal   Collection Time: 02/28/14  5:27 PM  Result Value Ref Range   WBC 17.7 (H) 4.0 - 10.5 K/uL   RBC 5.68 (H) 3.87 - 5.11 MIL/uL   Hemoglobin 17.9 (H) 12.0 - 15.0 g/dL   HCT 53.3 (H) 36.0 - 46.0 %   MCV 93.8 78.0 - 100.0 fL   MCH 31.5 26.0 - 34.0 pg   MCHC 33.6 30.0 - 36.0 g/dL   RDW 13.7 11.5 - 15.5 %   Platelets 129 (L) 150 - 400 K/uL   Neutrophils Relative % 87 (H) 43 - 77 %   Neutro Abs 15.3 (H) 1.7 - 7.7 K/uL   Lymphocytes Relative 3 (L) 12 - 46 %   Lymphs Abs 0.6 (L) 0.7 - 4.0 K/uL   Monocytes Relative 10 3 - 12 %   Monocytes Absolute 1.8 (H) 0.1 - 1.0 K/uL   Eosinophils Relative 0 0 - 5 %   Eosinophils Absolute 0.0 0.0 - 0.7 K/uL   Basophils Relative 0 0 - 1 %   Basophils Absolute 0.0 0.0 - 0.1 K/uL  Comprehensive metabolic panel     Status: Abnormal   Collection Time: 02/28/14  5:27 PM  Result Value Ref Range   Sodium 135 135 - 145 mmol/L   Potassium 3.5 3.5 - 5.1 mmol/L   Chloride 96 96 - 112 mmol/L   CO2 25 19 - 32 mmol/L   Glucose, Bld 161 (H) 70 - 99 mg/dL   BUN 15 6 - 23 mg/dL   Creatinine, Ser 0.72 0.50 - 1.10 mg/dL   Calcium 8.2 (L) 8.4 - 10.5 mg/dL   Total Protein 6.7 6.0 - 8.3 g/dL   Albumin 3.6 3.5 - 5.2 g/dL   AST 41 (H) 0 - 37 U/L   ALT 23 0 - 35 U/L   Alkaline Phosphatase 100 39 - 117 U/L   Total Bilirubin 0.7 0.3 - 1.2 mg/dL   GFR calc non Af Amer 76 (L) >90 mL/min   GFR calc Af Amer 88 (L) >90 mL/min    Comment: (NOTE) The  eGFR has been calculated using the CKD EPI equation. This calculation has not been validated in all clinical situations. eGFR's persistently <90 mL/min signify possible Chronic Kidney Disease.    Anion gap 14 5 - 15  Troponin I     Status: Abnormal   Collection Time: 02/28/14  5:27 PM  Result Value Ref Range   Troponin I 0.36 (H) <0.031 ng/mL    Comment:        PERSISTENTLY INCREASED TROPONIN VALUES IN THE RANGE OF 0.04-0.49 ng/mL CAN BE SEEN IN:       -UNSTABLE ANGINA       -CONGESTIVE HEART FAILURE       -MYOCARDITIS       -CHEST TRAUMA       -ARRYHTHMIAS       -LATE PRESENTING MYOCARDIAL INFARCTION       -COPD   CLINICAL FOLLOW-UP RECOMMENDED.   Protime-INR     Status: None   Collection Time: 02/28/14  5:27 PM  Result Value Ref Range   Prothrombin Time 13.0 11.6 - 15.2 seconds   INR 0.97 0.00 - 1.49  Lipase, blood  Status: None   Collection Time: 02/28/14  5:27 PM  Result Value Ref Range   Lipase 18 11 - 59 U/L  Blood Culture (routine x 2)     Status: None   Collection Time: 02/28/14  5:41 PM  Result Value Ref Range   Specimen Description BLOOD RAC    Special Requests BOTTLES DRAWN AEROBIC ONLY 3ML    Culture      NO GROWTH 5 DAYS Performed at [facility]    Report Status 03/07/2014 FINAL   Urinalysis, Routine w reflex microscopic     Status: Abnormal   Collection Time: 02/28/14  5:49 PM  Result Value Ref Range   Color, Urine YELLOW YELLOW   APPearance CLOUDY (A) CLEAR   Specific Gravity, Urine 1.014 1.005 - 1.030   pH 5.0 5.0 - 8.0   Glucose, UA 100 (A) NEGATIVE mg/dL   Hgb urine dipstick MODERATE (A) NEGATIVE   Bilirubin Urine NEGATIVE NEGATIVE   Ketones, ur 15 (A) NEGATIVE mg/dL   Protein, ur 100 (A) NEGATIVE mg/dL   Urobilinogen, UA 0.2 0.0 - 1.0 mg/dL   Nitrite NEGATIVE NEGATIVE   Leukocytes, UA NEGATIVE NEGATIVE  Urine culture     Status: None   Collection Time: 02/28/14  5:49 PM  Result Value Ref Range   Specimen Description URINE,  CATHETERIZED    Special Requests NONE    Colony Count      >=100,000 COLONIES/ML Performed at [facility] Performed at [facility]    Report Status 03/03/2014 FINAL    Organism ID, Bacteria KLEBSIELLA PNEUMONIAE       Susceptibility   Klebsiella pneumoniae - MIC*    AMPICILLIN >=32 RESISTANT Resistant     CEFAZOLIN <=4 SENSITIVE Sensitive     CEFTRIAXONE <=1 SENSITIVE Sensitive     CIPROFLOXACIN <=0.25 SENSITIVE Sensitive     GENTAMICIN <=1 SENSITIVE Sensitive     LEVOFLOXACIN <=0.12 SENSITIVE Sensitive     NITROFURANTOIN 128 RESISTANT Resistant     TOBRAMYCIN <=1 SENSITIVE Sensitive     TRIMETH/SULFA <=20 SENSITIVE Sensitive     PIP/TAZO <=4 SENSITIVE Sensitive     * KLEBSIELLA PNEUMONIAE  Urine microscopic-add on     Status: Abnormal   Collection Time: 02/28/14  5:49 PM  Result Value Ref Range   Squamous Epithelial / LPF RARE RARE   WBC, UA 21-50 <3 WBC/hpf   RBC / HPF 0-2 <3 RBC/hpf   Bacteria, UA MANY (A) RARE  I-Stat CG4 Lactic Acid, ED (not at [facility])     Status: Abnormal   Collection Time: 02/28/14  5:51 PM  Result Value Ref Range   Lactic Acid, Venous 4.85 (HH) 0.5 - 2.0 mmol/L   Comment NOTIFIED PHYSICIAN   I-Stat CG4 Lactic Acid, ED     Status: None   Collection Time: 02/28/14  8:00 PM  Result Value Ref Range   Lactic Acid, Venous 1.42 0.5 - 2.0 mmol/L  Troponin I (q 6hr x 3)     Status: Abnormal   Collection Time: 02/28/14  9:03 PM  Result Value Ref Range   Troponin I 0.31 (H) <0.031 ng/mL    Comment:        PERSISTENTLY INCREASED TROPONIN VALUES IN THE RANGE OF 0.04-0.49 ng/mL CAN BE SEEN IN:       -UNSTABLE ANGINA       -CONGESTIVE HEART FAILURE       -MYOCARDITIS       -  CHEST TRAUMA       -ARRYHTHMIAS       -LATE PRESENTING MYOCARDIAL INFARCTION       -COPD   CLINICAL FOLLOW-UP RECOMMENDED.   MRSA PCR Screening     Status: None   Collection Time: 02/28/14 10:49 PM  Result Value Ref Range    MRSA by PCR NEGATIVE NEGATIVE    Comment:        The GeneXpert MRSA Assay (FDA approved for NASAL specimens only), is one component of a comprehensive MRSA colonization surveillance program. It is not intended to diagnose MRSA infection nor to guide or monitor treatment for MRSA infections.   APTT     Status: Abnormal   Collection Time: 02/28/14 11:22 PM  Result Value Ref Range   aPTT >200 (HH) 24 - 37 seconds    Comment:        IF BASELINE aPTT IS ELEVATED, SUGGEST PATIENT RISK ASSESSMENT BE USED TO DETERMINE APPROPRIATE ANTICOAGULANT THERAPY. CRITICAL RESULT CALLED TO, READ BACK BY AND VERIFIED WITH: SPOKE WITH GRIGG,C RN [id number] [phone] [place],N   Troponin I (q 6hr x 3)     Status: Abnormal   Collection Time: 03/01/14  3:04 AM  Result Value Ref Range   Troponin I 0.16 (H) <0.031 ng/mL    Comment:        PERSISTENTLY INCREASED TROPONIN VALUES IN THE RANGE OF 0.04-0.49 ng/mL CAN BE SEEN IN:       -UNSTABLE ANGINA       -CONGESTIVE HEART FAILURE       -MYOCARDITIS       -CHEST TRAUMA       -ARRYHTHMIAS       -LATE PRESENTING MYOCARDIAL INFARCTION       -COPD   CLINICAL FOLLOW-UP RECOMMENDED.   CBC     Status: Abnormal   Collection Time: 03/01/14  3:04 AM  Result Value Ref Range   WBC 7.5 4.0 - 10.5 K/uL   RBC 3.86 (L) 3.87 - 5.11 MIL/uL   Hemoglobin 11.8 (L) 12.0 - 15.0 g/dL    Comment: DELTA CHECK NOTED REPEATED TO VERIFY    HCT 36.6 36.0 - 46.0 %   MCV 94.8 78.0 - 100.0 fL   MCH 30.6 26.0 - 34.0 pg   MCHC 32.2 30.0 - 36.0 g/dL   RDW 14.1 11.5 - 15.5 %   Platelets 92 (L) 150 - 400 K/uL    Comment: DELTA CHECK NOTED REPEATED TO VERIFY   Basic metabolic panel     Status: Abnormal   Collection Time: 03/01/14  3:04 AM  Result Value Ref Range   Sodium 137 135 - 145 mmol/L   Potassium 3.4 (L) 3.5 - 5.1 mmol/L   Chloride 107 96 - 112 mmol/L    Comment: DELTA CHECK NOTED REPEATED TO VERIFY    CO2 26 19 - 32 mmol/L   Glucose, Bld 117 (H) 70 - 99 mg/dL    BUN 16 6 - 23 mg/dL   Creatinine, Ser 0.60 0.50 - 1.10 mg/dL   Calcium 6.7 (L) 8.4 - 10.5 mg/dL   GFR calc non Af Amer 80 (L) >90 mL/min   GFR calc Af Amer >90 >90 mL/min    Comment: (NOTE) The eGFR has been calculated using the CKD EPI equation. This calculation has not been validated in all clinical situations. eGFR's persistently <90 mL/min signify possible Chronic Kidney Disease.    Anion gap 4 (L) 5 - 15  Heparin level (unfractionated)  Status: Abnormal   Collection Time: 03/01/14  5:54 AM  Result Value Ref Range   Heparin Unfractionated 0.20 (L) 0.30 - 0.70 IU/mL    Comment:        IF HEPARIN RESULTS ARE BELOW EXPECTED VALUES, AND PATIENT DOSAGE HAS BEEN CONFIRMED, SUGGEST FOLLOW UP TESTING OF ANTITHROMBIN III LEVELS.   Troponin I (q 6hr x 3)     Status: Abnormal   Collection Time: 03/01/14  8:54 AM  Result Value Ref Range   Troponin I 0.11 (H) <0.031 ng/mL    Comment:        PERSISTENTLY INCREASED TROPONIN VALUES IN THE RANGE OF 0.04-0.49 ng/mL CAN BE SEEN IN:       -UNSTABLE ANGINA       -CONGESTIVE HEART FAILURE       -MYOCARDITIS       -CHEST TRAUMA       -ARRYHTHMIAS       -LATE PRESENTING MYOCARDIAL INFARCTION       -COPD   CLINICAL FOLLOW-UP RECOMMENDED.   Procalcitonin - Baseline     Status: None   Collection Time: 03/01/14  9:00 AM  Result Value Ref Range   Procalcitonin 1.29 ng/mL    Comment:        Interpretation: PCT > 0.5 ng/mL and <= 2 ng/mL: Systemic infection (sepsis) is possible, but other conditions are known to elevate PCT as well. (NOTE)         ICU PCT Algorithm               Non ICU PCT Algorithm    ----------------------------     ------------------------------         PCT < 0.25 ng/mL                 PCT < 0.1 ng/mL     Stopping of antibiotics            Stopping of antibiotics       strongly encouraged.               strongly encouraged.    ----------------------------     ------------------------------       PCT level  decrease by               PCT < 0.25 ng/mL       >= 80% from peak PCT       OR PCT 0.25 - 0.5 ng/mL          Stopping of antibiotics                                             encouraged.     Stopping of antibiotics           encouraged.    ----------------------------     ------------------------------       PCT level decrease by              PCT >= 0.25 ng/mL       < 80% from peak PCT        AND PCT >= 0.5 ng/mL             Continuing antibiotics  encouraged.       Continuing antibiotics            encouraged.    ----------------------------     ------------------------------     PCT level increase compared          PCT > 0.5 ng/mL         with peak PCT AND          PCT >= 0.5 ng/mL             Escalation of antibiotics                                          strongly encouraged.      Escalation of antibiotics        strongly encouraged.   Heparin level (unfractionated)     Status: None   Collection Time: 03/01/14  3:00 PM  Result Value Ref Range   Heparin Unfractionated 0.40 0.30 - 0.70 IU/mL    Comment:        IF HEPARIN RESULTS ARE BELOW EXPECTED VALUES, AND PATIENT DOSAGE HAS BEEN CONFIRMED, SUGGEST FOLLOW UP TESTING OF ANTITHROMBIN III LEVELS.   CBC     Status: Abnormal   Collection Time: 03/02/14  4:07 AM  Result Value Ref Range   WBC 3.5 (L) 4.0 - 10.5 K/uL   RBC 3.26 (L) 3.87 - 5.11 MIL/uL   Hemoglobin 10.1 (L) 12.0 - 15.0 g/dL   HCT 31.8 (L) 36.0 - 46.0 %   MCV 97.5 78.0 - 100.0 fL   MCH 31.0 26.0 - 34.0 pg   MCHC 31.8 30.0 - 36.0 g/dL   RDW 14.3 11.5 - 15.5 %   Platelets 83 (L) 150 - 400 K/uL    Comment: CONSISTENT WITH PREVIOUS RESULT  CBC     Status: Abnormal   Collection Time: 03/03/14  4:13 AM  Result Value Ref Range   WBC 3.7 (L) 4.0 - 10.5 K/uL   RBC 3.66 (L) 3.87 - 5.11 MIL/uL   Hemoglobin 11.3 (L) 12.0 - 15.0 g/dL   HCT 35.7 (L) 36.0 - 46.0 %   MCV 97.5 78.0 - 100.0 fL   MCH 30.9 26.0 - 34.0 pg    MCHC 31.7 30.0 - 36.0 g/dL   RDW 13.8 11.5 - 15.5 %   Platelets 93 (L) 150 - 400 K/uL    Comment: CONSISTENT WITH PREVIOUS RESULT  Procalcitonin     Status: None   Collection Time: 03/03/14  4:13 AM  Result Value Ref Range   Procalcitonin 0.36 ng/mL    Comment:        Interpretation: PCT (Procalcitonin) <= 0.5 ng/mL: Systemic infection (sepsis) is not likely. Local bacterial infection is possible. (NOTE)         ICU PCT Algorithm               Non ICU PCT Algorithm    ----------------------------     ------------------------------         PCT < 0.25 ng/mL                 PCT < 0.1 ng/mL     Stopping of antibiotics            Stopping of antibiotics       strongly encouraged.               strongly encouraged.    ----------------------------     ------------------------------  PCT level decrease by               PCT < 0.25 ng/mL       >= 80% from peak PCT       OR PCT 0.25 - 0.5 ng/mL          Stopping of antibiotics                                             encouraged.     Stopping of antibiotics           encouraged.    ----------------------------     ------------------------------       PCT level decrease by              PCT >= 0.25 ng/mL       < 80% from peak PCT        AND PCT >= 0.5 ng/mL            Continuin g antibiotics                                              encouraged.       Continuing antibiotics            encouraged.    ----------------------------     ------------------------------     PCT level increase compared          PCT > 0.5 ng/mL         with peak PCT AND          PCT >= 0.5 ng/mL             Escalation of antibiotics                                          strongly encouraged.      Escalation of antibiotics        strongly encouraged.   Clostridium Difficile by PCR     Status: None   Collection Time: 03/03/14  8:24 AM  Result Value Ref Range   C difficile by pcr NEGATIVE NEGATIVE    Comment: Performed at [facility]  Troponin  I (q 6hr x 3)     Status: None   Collection Time: 03/03/14  1:06 PM  Result Value Ref Range   Troponin I <0.03 <0.031 ng/mL    Comment:        NO INDICATION OF MYOCARDIAL INJURY.   Troponin I (q 6hr x 3)     Status: None   Collection Time: 03/03/14  7:46 PM  Result Value Ref Range   Troponin I <0.03 <0.031 ng/mL    Comment:        NO INDICATION OF MYOCARDIAL INJURY.   Stool culture     Status: None   Collection Time: 03/03/14  9:52 PM  Result Value Ref Range   Specimen Description STOOL    Special Requests NONE    Culture      ABUNDANT CANDIDA ALBICANS NO SALMONELLA, SHIGELLA, CAMPYLOBACTER, YERSINIA, OR E.COLI 0157:[id number] ISOLATED Performed at [facility]    Report Status 03/07/2014 FINAL  Troponin I (q 6hr x 3)     Status: None   Collection Time: 03/04/14 12:29 AM  Result Value Ref Range   Troponin I 0.03 <0.031 ng/mL    Comment:        NO INDICATION OF MYOCARDIAL INJURY.   CBC     Status: Abnormal   Collection Time: 03/04/14  3:50 AM  Result Value Ref Range   WBC 4.1 4.0 - 10.5 K/uL   RBC 3.53 (L) 3.87 - 5.11 MIL/uL   Hemoglobin 11.0 (L) 12.0 - 15.0 g/dL   HCT 34.0 (L) 36.0 - 46.0 %   MCV 96.3 78.0 - 100.0 fL   MCH 31.2 26.0 - 34.0 pg   MCHC 32.4 30.0 - 36.0 g/dL   RDW 13.8 11.5 - 15.5 %   Platelets 95 (L) 150 - 400 K/uL    Comment: CONSISTENT WITH PREVIOUS RESULT  CBC     Status: Abnormal   Collection Time: 03/05/14  3:48 AM  Result Value Ref Range   WBC 3.6 (L) 4.0 - 10.5 K/uL   RBC 3.42 (L) 3.87 - 5.11 MIL/uL   Hemoglobin 10.7 (L) 12.0 - 15.0 g/dL   HCT 33.0 (L) 36.0 - 46.0 %   MCV 96.5 78.0 - 100.0 fL   MCH 31.3 26.0 - 34.0 pg   MCHC 32.4 30.0 - 36.0 g/dL   RDW 13.7 11.5 - 15.5 %   Platelets 123 (L) 150 - 400 K/uL  Procalcitonin     Status: None   Collection Time: 03/05/14  3:48 AM  Result Value Ref Range   Procalcitonin <0.10 ng/mL    Comment:        Interpretation: PCT (Procalcitonin) <= 0.5 ng/mL: Systemic infection (sepsis) is not  likely. Local bacterial infection is possible. (NOTE)         ICU PCT Algorithm               Non ICU PCT Algorithm    ----------------------------     ------------------------------         PCT < 0.25 ng/mL                 PCT < 0.1 ng/mL     Stopping of antibiotics            Stopping of antibiotics       strongly encouraged.               strongly encouraged.    ----------------------------     ------------------------------       PCT level decrease by               PCT < 0.25 ng/mL       >= 80% from peak PCT       OR PCT 0.25 - 0.5 ng/mL          Stopping of antibiotics                                             encouraged.     Stopping of antibiotics           encouraged.    ----------------------------     ------------------------------       PCT level decrease by              PCT >= 0.25 ng/mL       <  80% from peak PCT        AND PCT >= 0.5 ng/mL            Continuin g antibiotics                                              encouraged.       Continuing antibiotics            encouraged.    ----------------------------     ------------------------------     PCT level increase compared          PCT > 0.5 ng/mL         with peak PCT AND          PCT >= 0.5 ng/mL             Escalation of antibiotics                                          strongly encouraged.      Escalation of antibiotics        strongly encouraged.   CBC     Status: Abnormal   Collection Time: 03/06/14  4:17 AM  Result Value Ref Range   WBC 3.9 (L) 4.0 - 10.5 K/uL   RBC 3.42 (L) 3.87 - 5.11 MIL/uL   Hemoglobin 10.4 (L) 12.0 - 15.0 g/dL   HCT 33.3 (L) 36.0 - 46.0 %   MCV 97.4 78.0 - 100.0 fL   MCH 30.4 26.0 - 34.0 pg   MCHC 31.2 30.0 - 36.0 g/dL   RDW 13.5 11.5 - 15.5 %   Platelets 144 (L) 150 - 400 K/uL  CBC     Status: Abnormal   Collection Time: 03/07/14  5:10 AM  Result Value Ref Range   WBC 4.0 4.0 - 10.5 K/uL   RBC 3.57 (L) 3.87 - 5.11 MIL/uL   Hemoglobin 10.8 (L) 12.0 - 15.0 g/dL   HCT  34.7 (L) 36.0 - 46.0 %   MCV 97.2 78.0 - 100.0 fL   MCH 30.3 26.0 - 34.0 pg   MCHC 31.1 30.0 - 36.0 g/dL   RDW 13.3 11.5 - 15.5 %   Platelets 175 150 - 400 K/uL  CBC     Status: Abnormal   Collection Time: 03/08/14  4:40 AM  Result Value Ref Range   WBC 4.3 4.0 - 10.5 K/uL   RBC 3.56 (L) 3.87 - 5.11 MIL/uL   Hemoglobin 10.9 (L) 12.0 - 15.0 g/dL   HCT 34.5 (L) 36.0 - 46.0 %   MCV 96.9 78.0 - 100.0 fL   MCH 30.6 26.0 - 34.0 pg   MCHC 31.6 30.0 - 36.0 g/dL   RDW 13.3 11.5 - 15.5 %   Platelets 177 150 - 400 K/uL  CBC     Status: Abnormal   Collection Time: 03/09/14  5:15 AM  Result Value Ref Range   WBC 3.3 (L) 4.0 - 10.5 K/uL   RBC 3.61 (L) 3.87 - 5.11 MIL/uL   Hemoglobin 10.9 (L) 12.0 - 15.0 g/dL   HCT 35.4 (L) 36.0 - 46.0 %   MCV 98.1 78.0 - 100.0 fL   MCH 30.2 26.0 - 34.0 pg   MCHC 30.8 30.0 - 36.0 g/dL  RDW 13.4 11.5 - 15.5 %   Platelets 195 150 - 400 K/uL  Urinalysis, Routine w reflex microscopic     Status: Abnormal   Collection Time: 03/26/14 12:55 PM  Result Value Ref Range   Color, Urine YELLOW YELLOW   APPearance CLEAR CLEAR   Specific Gravity, Urine 1.007 1.005 - 1.030   pH 6.0 5.0 - 8.0   Glucose, UA NEGATIVE NEGATIVE mg/dL   Hgb urine dipstick NEGATIVE NEGATIVE   Bilirubin Urine NEGATIVE NEGATIVE   Ketones, ur NEGATIVE NEGATIVE mg/dL   Protein, ur NEGATIVE NEGATIVE mg/dL   Urobilinogen, UA 0.2 0.0 - 1.0 mg/dL   Nitrite NEGATIVE NEGATIVE   Leukocytes, UA SMALL (A) NEGATIVE  Urine culture     Status: None   Collection Time: 03/26/14 12:55 PM  Result Value Ref Range   Specimen Description URINE, CLEAN CATCH    Special Requests NONE    Colony Count      >=100,000 COLONIES/ML Performed at [place] Performed at [facility]    Report Status 03/29/2014 FINAL    Organism ID, Bacteria KLEBSIELLA OXYTOCA    Organism ID, Bacteria ESCHERICHIA COLI       Susceptibility   Escherichia  coli - MIC*    AMPICILLIN 4 SENSITIVE Sensitive     CEFAZOLIN <=4 SENSITIVE Sensitive     CEFTRIAXONE <=1 SENSITIVE Sensitive     CIPROFLOXACIN <=0.25 SENSITIVE Sensitive     GENTAMICIN <=1 SENSITIVE Sensitive     LEVOFLOXACIN <=0.12 SENSITIVE Sensitive     NITROFURANTOIN <=16 SENSITIVE Sensitive     TOBRAMYCIN <=1 SENSITIVE Sensitive     TRIMETH/SULFA <=20 SENSITIVE Sensitive     PIP/TAZO <=4 SENSITIVE Sensitive     * ESCHERICHIA COLI   Klebsiella oxytoca - MIC*    AMPICILLIN >=32 RESISTANT Resistant     CEFAZOLIN 8 SENSITIVE Sensitive     CEFTRIAXONE <=1 SENSITIVE Sensitive     CIPROFLOXACIN <=0.25 SENSITIVE Sensitive     GENTAMICIN <=1 SENSITIVE Sensitive     LEVOFLOXACIN <=0.12 SENSITIVE Sensitive     NITROFURANTOIN <=16 SENSITIVE Sensitive     TOBRAMYCIN <=1 SENSITIVE Sensitive     TRIMETH/SULFA <=20 SENSITIVE Sensitive     PIP/TAZO <=4 SENSITIVE Sensitive     * KLEBSIELLA OXYTOCA  Urine microscopic-add on     Status: Abnormal   Collection Time: 03/26/14 12:55 PM  Result Value Ref Range   Squamous Epithelial / LPF RARE RARE   WBC, UA 11-20 <3 WBC/hpf   RBC / HPF 0-2 <3 RBC/hpf   Bacteria, UA FEW (A) RARE  CBC with Differential     Status: Abnormal   Collection Time: 03/26/14  1:10 PM  Result Value Ref Range   WBC 4.4 4.0 - 10.5 K/uL   RBC 3.69 (L) 3.87 - 5.11 MIL/uL   Hemoglobin 11.3 (L) 12.0 - 15.0 g/dL   HCT 35.5 (L) 36.0 - 46.0 %   MCV 96.2 78.0 - 100.0 fL   MCH 30.6 26.0 - 34.0 pg   MCHC 31.8 30.0 - 36.0 g/dL   RDW 12.8 11.5 - 15.5 %   Platelets 119 (L) 150 - 400 K/uL    Comment: SPECIMEN CHECKED FOR CLOTS PLATELET COUNT CONFIRMED BY SMEAR    Neutrophils Relative % 71 43 - 77 %   Neutro Abs 3.1 1.7 - 7.7 K/uL   Lymphocytes Relative 15 12 - 46 %   Lymphs  Abs 0.7 0.7 - 4.0 K/uL   Monocytes Relative 13 (H) 3 - 12 %   Monocytes Absolute 0.6 0.1 - 1.0 K/uL   Eosinophils Relative 1 0 - 5 %   Eosinophils Absolute 0.0 0.0 - 0.7 K/uL   Basophils Relative 1 0 - 1  %   Basophils Absolute 0.0 0.0 - 0.1 K/uL  Brain natriuretic peptide     Status: Abnormal   Collection Time: 03/26/14  1:10 PM  Result Value Ref Range   B Natriuretic Peptide 101.3 (H) 0.0 - 100.0 pg/mL  Troponin I     Status: None   Collection Time: 03/26/14  1:10 PM  Result Value Ref Range   Troponin I <0.03 <0.031 ng/mL    Comment:        NO INDICATION OF MYOCARDIAL INJURY.   Comprehensive metabolic panel     Status: Abnormal   Collection Time: 03/26/14  1:10 PM  Result Value Ref Range   Sodium 139 135 - 145 mmol/L   Potassium 4.0 3.5 - 5.1 mmol/L   Chloride 101 96 - 112 mmol/L   CO2 29 19 - 32 mmol/L   Glucose, Bld 128 (H) 70 - 99 mg/dL   BUN 11 6 - 23 mg/dL   Creatinine, Ser 0.67 0.50 - 1.10 mg/dL   Calcium 8.5 8.4 - 10.5 mg/dL   Total Protein 6.4 6.0 - 8.3 g/dL   Albumin 3.3 (L) 3.5 - 5.2 g/dL   AST 20 0 - 37 U/L   ALT 11 0 - 35 U/L   Alkaline Phosphatase 82 39 - 117 U/L   Total Bilirubin 0.4 0.3 - 1.2 mg/dL   GFR calc non Af Amer 77 (L) >90 mL/min   GFR calc Af Amer 90 (L) >90 mL/min    Comment: (NOTE) The eGFR has been calculated using the CKD EPI equation. This calculation has not been validated in all clinical situations. eGFR's persistently <90 mL/min signify possible Chronic Kidney Disease.    Anion gap 9 5 - 15  I-Stat CG4 Lactic Acid, ED     Status: None   Collection Time: 03/26/14  1:25 PM  Result Value Ref Range   Lactic Acid, Venous 0.71 0.5 - 2.0 mmol/L  CBC with Differential/Platelet     Status: Abnormal   Collection Time: 03/28/14 11:44 AM  Result Value Ref Range   WBC 3.8 (L) 4.0 - 10.5 K/uL   RBC 3.80 (L) 3.87 - 5.11 MIL/uL   Hemoglobin 11.7 (L) 12.0 - 15.0 g/dL   HCT 36.1 36.0 - 46.0 %   MCV 95.0 78.0 - 100.0 fL   MCH 30.8 26.0 - 34.0 pg   MCHC 32.4 30.0 - 36.0 g/dL   RDW 13.2 11.5 - 15.5 %   Platelets 120 (L) 150 - 400 K/uL   Neutrophils Relative % 77 43 - 77 %   Neutro Abs 2.9 1.7 - 7.7 K/uL   Lymphocytes Relative 11 (L) 12 - 46 %    Lymphs Abs 0.4 (L) 0.7 - 4.0 K/uL   Monocytes Relative 11 3 - 12 %   Monocytes Absolute 0.4 0.1 - 1.0 K/uL   Eosinophils Relative 1 0 - 5 %   Eosinophils Absolute 0.0 0.0 - 0.7 K/uL   Basophils Relative 0 0 - 1 %   Basophils Absolute 0.0 0.0 - 0.1 K/uL  Basic metabolic panel     Status: Abnormal   Collection Time: 03/28/14 11:44 AM  Result Value Ref Range   Sodium  139 135 - 145 mmol/L   Potassium 4.0 3.5 - 5.1 mmol/L   Chloride 101 96 - 112 mmol/L   CO2 30 19 - 32 mmol/L   Glucose, Bld 104 (H) 70 - 99 mg/dL   BUN 8 6 - 23 mg/dL   Creatinine, Ser 0.75 0.50 - 1.10 mg/dL   Calcium 8.3 (L) 8.4 - 10.5 mg/dL   GFR calc non Af Amer 75 (L) >90 mL/min   GFR calc Af Amer 86 (L) >90 mL/min    Comment: (NOTE) The eGFR has been calculated using the CKD EPI equation. This calculation has not been validated in all clinical situations. eGFR's persistently <90 mL/min signify possible Chronic Kidney Disease.    Anion gap 8 5 - 15  I-stat troponin, ED     Status: None   Collection Time: 03/28/14 11:54 AM  Result Value Ref Range   Troponin i, poc 0.00 0.00 - 0.08 ng/mL   Comment 3            Comment: Due to the release kinetics of cTnI, a negative result within the first hours of the onset of symptoms does not rule out myocardial infarction with certainty. If myocardial infarction is still suspected, repeat the test at appropriate intervals.   I-Stat CG4 Lactic Acid, ED     Status: None   Collection Time: 03/28/14 12:02 PM  Result Value Ref Range   Lactic Acid, Venous 0.50 0.5 - 2.0 mmol/L  Blood culture (routine x 2)     Status: None   Collection Time: 03/28/14  3:02 PM  Result Value Ref Range   Specimen Description BLOOD RIGHT HAND    Special Requests BOTTLES DRAWN AEROBIC AND ANAEROBIC 5CC    Culture      NO GROWTH 5 DAYS Performed at [facility]    Report Status 04/03/2014 FINAL   Blood culture (routine x 2)     Status: None   Collection Time: 03/28/14  3:20 PM  Result  Value Ref Range   Specimen Description BLOOD LEFT HAND    Special Requests BOTTLES DRAWN AEROBIC AND ANAEROBIC 5CC    Culture      NO GROWTH 5 DAYS Performed at [facility]    Report Status 04/03/2014 FINAL   I-Stat CG4 Lactic Acid, ED     Status: None   Collection Time: 03/28/14  3:39 PM  Result Value Ref Range   Lactic Acid, Venous 0.50 0.5 - 2.0 mmol/L  Influenza panel by pcr     Status: None   Collection Time: 03/28/14  5:50 PM  Result Value Ref Range   Influenza A By PCR NEGATIVE NEGATIVE   Influenza B By PCR NEGATIVE NEGATIVE   H1N1 flu by pcr NOT DETECTED NOT DETECTED    Comment:        The Xpert Flu assay (FDA approved for nasal aspirates or washes and nasopharyngeal swab specimens), is intended as an aid in the diagnosis of influenza and should not be used as a sole basis for treatment.   Procalcitonin - Baseline     Status: None   Collection Time: 03/28/14  7:03 PM  Result Value Ref Range   Procalcitonin <0.10 ng/mL    Comment:        Interpretation: PCT (Procalcitonin) <= 0.5 ng/mL: Systemic infection (sepsis) is not likely. Local bacterial infection is possible. (NOTE)         ICU PCT Algorithm  Non ICU PCT Algorithm    ----------------------------     ------------------------------         PCT < 0.25 ng/mL                 PCT < 0.1 ng/mL     Stopping of antibiotics            Stopping of antibiotics       strongly encouraged.               strongly encouraged.    ----------------------------     ------------------------------       PCT level decrease by               PCT < 0.25 ng/mL       >= 80% from peak PCT       OR PCT 0.25 - 0.5 ng/mL          Stopping of antibiotics                                             encouraged.     Stopping of antibiotics           encouraged.    ----------------------------     ------------------------------       PCT level decrease by              PCT >= 0.25 ng/mL       < 80% from peak PCT         AND PCT >= 0.5 ng/mL            Continuin g antibiotics                                              encouraged.       Continuing antibiotics            encouraged.    ----------------------------     ------------------------------     PCT level increase compared          PCT > 0.5 ng/mL         with peak PCT AND          PCT >= 0.5 ng/mL             Escalation of antibiotics                                          strongly encouraged.      Escalation of antibiotics        strongly encouraged.   Brain natriuretic peptide     Status: None   Collection Time: 03/28/14  7:03 PM  Result Value Ref Range   B Natriuretic Peptide 83.2 0.0 - 100.0 pg/mL  Troponin I     Status: None   Collection Time: 03/28/14  7:03 PM  Result Value Ref Range   Troponin I <0.03 <0.031 ng/mL    Comment:        NO INDICATION OF MYOCARDIAL INJURY.   Basic metabolic panel     Status: Abnormal   Collection Time: 03/29/14  5:00 AM  Result Value Ref Range  Sodium 142 135 - 145 mmol/L   Potassium 3.9 3.5 - 5.1 mmol/L   Chloride 104 96 - 112 mmol/L   CO2 32 19 - 32 mmol/L   Glucose, Bld 96 70 - 99 mg/dL   BUN 8 6 - 23 mg/dL   Creatinine, Ser 0.68 0.50 - 1.10 mg/dL   Calcium 8.0 (L) 8.4 - 10.5 mg/dL   GFR calc non Af Amer 77 (L) >90 mL/min   GFR calc Af Amer 89 (L) >90 mL/min    Comment: (NOTE) The eGFR has been calculated using the CKD EPI equation. This calculation has not been validated in all clinical situations. eGFR's persistently <90 mL/min signify possible Chronic Kidney Disease.    Anion gap 6 5 - 15  CBC     Status: Abnormal   Collection Time: 03/29/14  5:00 AM  Result Value Ref Range   WBC 2.4 (L) 4.0 - 10.5 K/uL   RBC 3.36 (L) 3.87 - 5.11 MIL/uL   Hemoglobin 10.1 (L) 12.0 - 15.0 g/dL   HCT 31.8 (L) 36.0 - 46.0 %   MCV 94.6 78.0 - 100.0 fL   MCH 30.1 26.0 - 34.0 pg   MCHC 31.8 30.0 - 36.0 g/dL   RDW 13.3 11.5 - 15.5 %   Platelets 121 (L) 150 - 400 K/uL  CBC     Status: Abnormal    Collection Time: 03/30/14  5:15 AM  Result Value Ref Range   WBC 2.5 (L) 4.0 - 10.5 K/uL   RBC 3.50 (L) 3.87 - 5.11 MIL/uL   Hemoglobin 10.6 (L) 12.0 - 15.0 g/dL   HCT 33.7 (L) 36.0 - 46.0 %   MCV 96.3 78.0 - 100.0 fL   MCH 30.3 26.0 - 34.0 pg   MCHC 31.5 30.0 - 36.0 g/dL   RDW 13.3 11.5 - 15.5 %   Platelets 121 (L) 150 - 400 K/uL  Basic metabolic panel     Status: Abnormal   Collection Time: 03/30/14  5:15 AM  Result Value Ref Range   Sodium 142 135 - 145 mmol/L   Potassium 3.9 3.5 - 5.1 mmol/L   Chloride 104 96 - 112 mmol/L   CO2 31 19 - 32 mmol/L   Glucose, Bld 99 70 - 99 mg/dL   BUN 7 6 - 23 mg/dL   Creatinine, Ser 0.65 0.50 - 1.10 mg/dL   Calcium 8.0 (L) 8.4 - 10.5 mg/dL   GFR calc non Af Amer 78 (L) >90 mL/min   GFR calc Af Amer >90 >90 mL/min    Comment: (NOTE) The eGFR has been calculated using the CKD EPI equation. This calculation has not been validated in all clinical situations. eGFR's persistently <90 mL/min signify possible Chronic Kidney Disease.    Anion gap 7 5 - 15  Procalcitonin     Status: None   Collection Time: 03/30/14  5:15 AM  Result Value Ref Range   Procalcitonin <0.10 ng/mL    Comment:        Interpretation: PCT (Procalcitonin) <= 0.5 ng/mL: Systemic infection (sepsis) is not likely. Local bacterial infection is possible. REPEATED TO VERIFY (NOTE)         ICU PCT Algorithm               Non ICU PCT Algorithm    ----------------------------     ------------------------------         PCT < 0.25 ng/mL  PCT < 0.1 ng/mL     Stopping of antibiotics            Stopping of antibiotics       strongly encouraged.               strongly encouraged.    ----------------------------     ------------------------------       PCT level decrease by               PCT < 0.25 ng/mL       >= 80% from peak PCT       OR PCT 0.25 - 0.5 ng/mL          Stopping of antibiotics                                             encouraged.     Stopping of  antibiotics           encouraged.    ----------------------------     ------------------------------       PCT level decrease by              PCT >= 0.25 ng/mL       < 80% from peak PCT        AND PCT >= 0.5 ng/mL             Continuing antibiotics                                              encouraged.       Continuing antibiotics            encouraged.    ----------------------------     ------------------------------     PCT level increase compared          PCT > 0.5 ng/mL         with peak PCT AND          PCT >= 0.5 ng/mL             Escalation of antibiotics                                          strongly encouraged.      Escalation of antibiotics        strongly encouraged.   POCT urinalysis dipstick     Status: Normal   Collection Time: 04/19/14  2:13 PM  Result Value Ref Range   Color, UA yellow    Clarity, UA clear    Glucose, UA negative    Bilirubin, UA negative    Ketones, UA negative    Spec Grav, UA 1.025    Blood, UA negative    pH, UA 6.0    Protein, UA negative    Urobilinogen, UA negative    Nitrite, UA negative    Leukocytes, UA Negative     Assessment/Plan: COPD exacerbation REsolved.  Patient instructed to continue chronic medications as directed.  Follow-up with Dr. Joya Gaskins as scheduled.   Other allergic rhinitis Supportive measures discussed.  Saline nasal spray as directed.  Humidifier in bedroom.

## 2014-05-14 NOTE — Assessment & Plan Note (Signed)
REsolved.  Patient instructed to continue chronic medications as directed.  Follow-up with Dr. Joya Gaskins as scheduled.

## 2014-05-16 NOTE — Patient Outreach (Signed)
[facility]) Care Management  05/16/2014  [name] [name] Mar 31, 1925 [id number]  Received notification from Raina Mina, RN to close case as patient has not responded to outreach calls or letter for [facility].  Case closed at this time.  [name]. [place] CM Assistant Phone: [phone] Fax: [phone]

## 2014-05-17 DIAGNOSIS — I509 Heart failure, unspecified: Secondary | ICD-10-CM | POA: Diagnosis not present

## 2014-05-17 DIAGNOSIS — M810 Age-related osteoporosis without current pathological fracture: Secondary | ICD-10-CM | POA: Diagnosis not present

## 2014-05-17 DIAGNOSIS — G8929 Other chronic pain: Secondary | ICD-10-CM | POA: Diagnosis not present

## 2014-05-17 DIAGNOSIS — K5100 Ulcerative (chronic) pancolitis without complications: Secondary | ICD-10-CM | POA: Diagnosis not present

## 2014-05-17 DIAGNOSIS — Z8701 Personal history of pneumonia (recurrent): Secondary | ICD-10-CM | POA: Diagnosis not present

## 2014-05-17 DIAGNOSIS — J441 Chronic obstructive pulmonary disease with (acute) exacerbation: Secondary | ICD-10-CM | POA: Diagnosis not present

## 2014-06-12 ENCOUNTER — Ambulatory Visit (INDEPENDENT_AMBULATORY_CARE_PROVIDER_SITE_OTHER): Payer: Medicare Other | Admitting: Critical Care Medicine

## 2014-06-12 ENCOUNTER — Encounter: Payer: Self-pay | Admitting: Critical Care Medicine

## 2014-06-12 VITALS — BP 102/54 | HR 87 | Temp 98.6°F | Ht 60.0 in | Wt 112.0 lb

## 2014-06-12 DIAGNOSIS — J432 Centrilobular emphysema: Secondary | ICD-10-CM

## 2014-06-12 NOTE — Patient Instructions (Signed)
No change in medications. Return in         4 months 

## 2014-06-12 NOTE — Assessment & Plan Note (Signed)
Gold stage C COPD stable at this time with resolution of pneumonia Plan Maintain inhaled medications as prescribed No further antibiotics indicated

## 2014-06-12 NOTE — Progress Notes (Signed)
Subjective:    Patient ID: [name] [name], female    DOB: 05-Mar-1925, 81 y.o.   MRN: [id number]  HPI 06/12/2014 Chief Complaint  Patient presents with  . Follow-up    Pt c/o increased SOB, chest tightness with exertion. Denies any cough, wheezing.     Pt notes dyspnea is the same. No real cough, no wheezing. No chest pain is noted. Pt denies any significant sore throat, nasal congestion or excess secretions, fever, chills, sweats, unintended weight loss, pleurtic or exertional chest pain, orthopnea PND, or leg swelling Pt denies any increase in rescue therapy over baseline, denies waking up needing it or having any early am or nocturnal exacerbations of coughing/wheezing/or dyspnea. Pt also denies any obvious fluctuation in symptoms with  weather or environmental change or other alleviating or aggravating factors   Current Medications, Allergies, Complete Past Medical History, Past Surgical History, Family History, and Social History were reviewed in [employer] record.  Past Medical History  Diagnosis Date  . Osteoporosis   . GERD (gastroesophageal reflux disease)   . Hiatal hernia   . PUD (peptic ulcer disease)   . Cardiac arrhythmia   . Hyperlipidemia   . Hypertension   . Scoliosis deformity of spine     history of intractable back pain  . Choledocholithiasis   . Pancolitis     Infectious vs. inflammatory  . Abdominal pain     Resolved  . Chronic anemia   . Chronic back pain   . Pancreatitis     History of  . CHF (congestive heart failure)     Diastolic  on ECHO 0865  . COPD (chronic obstructive pulmonary disease)     oxygen dependent at  night 2LPM  . PONV (postoperative nausea and vomiting)   . Reactive airway disease that is not asthma   . HCAP (healthcare-associated pneumonia) 03/28/2014  . Pneumonia 1930's; 2011 X 2; 02/2014  . On home oxygen therapy     "1L during the day; 2L q hs" (03/28/2014)  . Daily headache   . Osteoarthritis    "pretty much q where"   . Recurrent UTI (urinary tract infection)     "here lately" (03/28/2014)     Family History  Problem Relation Age of Onset  . Cancer Mother     uterine  . Heart disease Father   . Hypertension Other      History   Social History  . Marital Status: Widowed    Spouse Name: N/A  . Number of Children: 3  . Years of Education: N/A   Occupational History  .     Social History Main Topics  . Smoking status: Former Smoker -- 1.00 packs/day for 12 years    Types: Cigarettes    Quit date: 01/28/1988  . Smokeless tobacco: Never Used  . Alcohol Use: No  . Drug Use: No  . Sexual Activity: No   Other Topics Concern  . Not on file   Social History Narrative   1 grandchild at care link--Angelia   Lives with great-granddaughter     Allergies  Allergen Reactions  . Codeine     REACTION: n/v/d, HA  . Morphine     REACTION: n/v/d, HA  . Sulfa Antibiotics     Sick    . Zoledronic Acid     REACTION: Severe edema     Outpatient Prescriptions Prior to Visit  Medication Sig Dispense Refill  . acetaminophen (TYLENOL) 325 MG tablet Take 2  tablets (650 mg total) by mouth every 6 (six) hours as needed for mild pain (or Fever >/= 101). 30 tablet 0  . albuterol (PROVENTIL HFA;VENTOLIN HFA) 108 (90 BASE) MCG/ACT inhaler Inhale 1-2 puffs into the lungs 3 (three) times daily as needed for wheezing or shortness of breath (wheezing & shortness of breath).     . Ascorbic Acid (VITAMIN C) 500 MG tablet Take 500 mg by mouth daily.      [name] aspirin 325 MG tablet Take 1 tablet (325 mg total) by mouth daily. 30 tablet 0  . budesonide-formoterol (SYMBICORT) 160-4.5 MCG/ACT inhaler Inhale 2 puffs into the lungs 2 (two) times daily. 1 Inhaler 0  . butalbital-acetaminophen-caffeine (FIORICET, ESGIC) 50-325-40 MG per tablet Take 1 tablet by mouth every 8 (eight) hours as needed for headache (headache).     . Calcium Carbonate-Vitamin D (CALCIUM 600-D) 600-400 MG-UNIT per tablet  Take 1 tablet by mouth 2 (two) times daily with a meal.      . Clotrimazole 1 % OINT Take 1 application by mouth 2 (two) times daily as needed (mouth sores).     . fluticasone (FLONASE) 50 MCG/ACT nasal spray Place 2 sprays into both nostrils daily. 16 g 6  . gabapentin (NEURONTIN) 100 MG capsule Take 1 capsule (100 mg total) by mouth 2 (two) times daily. 60 capsule 3  . guaiFENesin (MUCINEX) 600 MG 12 hr tablet Take 600 mg by mouth daily as needed.    . metoprolol (LOPRESSOR) 50 MG tablet Take 1 tablet (50 mg total) by mouth 2 (two) times daily. 60 tablet 0  . nitroGLYCERIN (NITROSTAT) 0.4 MG SL tablet Place 1 tablet (0.4 mg total) under the tongue every 5 (five) minutes as needed for chest pain. 100 tablet 3  . omeprazole (PRILOSEC) 40 MG capsule Take 1 capsule (40 mg total) by mouth 2 (two) times daily. 60 capsule 3  . ondansetron (ZOFRAN) 4 MG tablet Take 1 tablet (4 mg total) by mouth every 6 (six) hours as needed for nausea. 20 tablet 0  . oxyCODONE-acetaminophen (PERCOCET/ROXICET) 5-325 MG per tablet Take 1 tablet by mouth every 6 (six) hours as needed for moderate pain or severe pain (pain). (Patient taking differently: Take 1 tablet by mouth 2 (two) times daily. ) 30 tablet 0  . polyethylene glycol (MIRALAX / GLYCOLAX) packet Take 17 g by mouth daily as needed for mild constipation or moderate constipation. 14 each 0  . VITAMIN E PO Take 1 tablet by mouth daily.     [name] lactulose (CHRONULAC) 10 GM/15ML solution Take 10 g by mouth daily as needed for mild constipation (constipation). 10 ml by mouth once daily at bedtime    . calcium-vitamin D (OSCAL WITH D) 500-200 MG-UNIT per tablet Take 2 tablets by mouth 2 (two) times daily.     No facility-administered medications prior to visit.         Review of Systems  Constitutional: Negative for fever, chills, diaphoresis, activity change, appetite change, fatigue and unexpected weight change.  HENT: Positive for voice change. Negative for  congestion, dental problem, ear discharge, ear pain, facial swelling, hearing loss, mouth sores, nosebleeds, postnasal drip, rhinorrhea, sinus pressure, sneezing, sore throat, tinnitus and trouble swallowing.   Eyes: Negative for photophobia, discharge, itching and visual disturbance.  Respiratory: Positive for cough and shortness of breath. Negative for apnea, choking, chest tightness, wheezing and stridor.   Cardiovascular: Negative for chest pain, palpitations and leg swelling.  Gastrointestinal: Negative for nausea, vomiting, abdominal pain,  constipation, blood in stool and abdominal distention.  Genitourinary: Negative for dysuria, urgency, frequency, hematuria, flank pain, decreased urine volume and difficulty urinating.  Musculoskeletal: Negative for myalgias, back pain, joint swelling, arthralgias, gait problem, neck pain and neck stiffness.  Skin: Negative for color change, pallor and rash.  Neurological: Negative for dizziness, tremors, seizures, syncope, speech difficulty, weakness, light-headedness, numbness and headaches.  Hematological: Negative for adenopathy. Does not bruise/bleed easily.  Psychiatric/Behavioral: Negative for confusion, sleep disturbance and agitation. The patient is not nervous/anxious.        Objective:   Physical Exam  Constitutional: She appears well-developed and well-nourished. She is active.  Non-toxic appearance. She does not appear ill. No distress.  HENT:  Head: Normocephalic and atraumatic.  Nose: No mucosal edema, rhinorrhea, sinus tenderness, nasal deformity or septal deviation. No epistaxis. Right sinus exhibits no maxillary sinus tenderness and no frontal sinus tenderness. Left sinus exhibits no maxillary sinus tenderness and no frontal sinus tenderness.  Mouth/Throat: Oropharynx is clear and moist. No oropharyngeal exudate.  Eyes: Conjunctivae and EOM are normal. Pupils are equal, round, and reactive to light. Right eye exhibits no discharge. Left  eye exhibits no discharge. No scleral icterus.  Neck: Normal range of motion. Neck supple. Normal carotid pulses, no hepatojugular reflux and no JVD present. No tracheal tenderness and no muscular tenderness present. Carotid bruit is not present. No rigidity. No tracheal deviation, no edema, no erythema and normal range of motion present. No thyroid mass and no thyromegaly present.  Cardiovascular: Normal rate, regular rhythm, S1 normal, S2 normal, normal heart sounds, intact distal pulses and normal pulses.  PMI is not displaced.  Exam reveals no gallop, no S3, no S4, no distant heart sounds and no friction rub.   No murmur heard.  No systolic murmur is present   No diastolic murmur is present  Pulmonary/Chest: No accessory muscle usage or stridor. No apnea and no tachypnea. No respiratory distress. She has decreased breath sounds in the right lower field and the left lower field. She has no wheezes. She has no rhonchi. She has no rales. Chest wall is not dull to percussion. She exhibits no mass, no tenderness, no bony tenderness and no deformity.  Abdominal: Soft. Normal appearance and bowel sounds are normal. She exhibits no distension, no ascites and no mass. There is no hepatosplenomegaly. There is no tenderness. There is no rigidity, no rebound, no guarding and no CVA tenderness.  Musculoskeletal: Normal range of motion.  Lymphadenopathy:       Head (right side): No submental and no submandibular adenopathy present.       Head (left side): No submental and no submandibular adenopathy present.    She has no cervical adenopathy.    She has no axillary adenopathy.  Neurological: She is alert. She has normal strength and normal reflexes. No cranial nerve deficit or sensory deficit.  Skin: Skin is warm and dry. No bruising, no ecchymosis, no lesion and no rash noted. She is not diaphoretic. No cyanosis or erythema. No pallor. Nails show no clubbing.  Psychiatric: She has a normal mood and affect.  Her speech is normal and behavior is normal.  Vitals reviewed.   Wt Readings from Last 3 Encounters:  06/12/14 112 lb (50.803 kg)  04/19/14 108 lb 2 oz (49.045 kg)  04/12/14 112 lb (50.803 kg)        Assessment & Plan:  I personally reviewed all images and lab data in the [facility] system as well as any outside  material available during this office visit and agree with the  radiology impressions.  I also have reviewed any data /notes/records if available in care everywhere.  COPD with emphysema, gold stage C. Gold stage C COPD stable at this time with resolution of pneumonia Plan Maintain inhaled medications as prescribed No further antibiotics indicated     Tykira was seen today for follow-up.  Diagnoses and all orders for this visit:  Centrilobular emphysema

## 2014-06-16 ENCOUNTER — Encounter: Payer: Self-pay | Admitting: *Deleted

## 2014-06-19 DIAGNOSIS — Z9689 Presence of other specified functional implants: Secondary | ICD-10-CM | POA: Diagnosis not present

## 2014-06-19 DIAGNOSIS — G894 Chronic pain syndrome: Secondary | ICD-10-CM | POA: Diagnosis not present

## 2014-06-19 DIAGNOSIS — M5136 Other intervertebral disc degeneration, lumbar region: Secondary | ICD-10-CM | POA: Diagnosis not present

## 2014-06-19 DIAGNOSIS — M545 Low back pain: Secondary | ICD-10-CM | POA: Diagnosis not present

## 2014-06-19 DIAGNOSIS — R51 Headache: Secondary | ICD-10-CM | POA: Diagnosis not present

## 2014-07-19 ENCOUNTER — Telehealth: Payer: Self-pay | Admitting: *Deleted

## 2014-07-19 NOTE — Telephone Encounter (Signed)
Pt dropped off parking placard application. Form filled out as much as possible and forwarded to [facility]. JG//CMA

## 2014-07-21 NOTE — Telephone Encounter (Signed)
Form up front for pick up. Pt aware. Copy sent for scanning. JG//CMA

## 2014-08-07 DIAGNOSIS — M5136 Other intervertebral disc degeneration, lumbar region: Secondary | ICD-10-CM | POA: Diagnosis not present

## 2014-08-07 DIAGNOSIS — Z9689 Presence of other specified functional implants: Secondary | ICD-10-CM | POA: Diagnosis not present

## 2014-08-07 DIAGNOSIS — R51 Headache: Secondary | ICD-10-CM | POA: Diagnosis not present

## 2014-08-07 DIAGNOSIS — G894 Chronic pain syndrome: Secondary | ICD-10-CM | POA: Diagnosis not present

## 2014-08-11 DIAGNOSIS — B351 Tinea unguium: Secondary | ICD-10-CM | POA: Diagnosis not present

## 2014-08-11 DIAGNOSIS — M2041 Other hammer toe(s) (acquired), right foot: Secondary | ICD-10-CM | POA: Diagnosis not present

## 2014-08-11 DIAGNOSIS — L84 Corns and callosities: Secondary | ICD-10-CM | POA: Diagnosis not present

## 2014-08-11 DIAGNOSIS — M2011 Hallux valgus (acquired), right foot: Secondary | ICD-10-CM | POA: Diagnosis not present

## 2014-08-29 ENCOUNTER — Ambulatory Visit: Payer: Medicare Other | Admitting: Family Medicine

## 2014-09-05 ENCOUNTER — Ambulatory Visit: Payer: Medicare Other | Admitting: Family Medicine

## 2014-09-06 ENCOUNTER — Ambulatory Visit: Payer: Medicare Other | Admitting: Family Medicine

## 2014-09-20 DIAGNOSIS — M5136 Other intervertebral disc degeneration, lumbar region: Secondary | ICD-10-CM | POA: Diagnosis not present

## 2014-09-20 DIAGNOSIS — M5441 Lumbago with sciatica, right side: Secondary | ICD-10-CM | POA: Diagnosis not present

## 2014-09-20 DIAGNOSIS — G894 Chronic pain syndrome: Secondary | ICD-10-CM | POA: Diagnosis not present

## 2014-09-20 DIAGNOSIS — Z9689 Presence of other specified functional implants: Secondary | ICD-10-CM | POA: Diagnosis not present

## 2014-09-20 DIAGNOSIS — Z5181 Encounter for therapeutic drug level monitoring: Secondary | ICD-10-CM | POA: Diagnosis not present

## 2014-09-20 DIAGNOSIS — Z79899 Other long term (current) drug therapy: Secondary | ICD-10-CM | POA: Diagnosis not present

## 2014-09-20 DIAGNOSIS — R51 Headache: Secondary | ICD-10-CM | POA: Diagnosis not present

## 2014-10-18 ENCOUNTER — Ambulatory Visit: Payer: Medicare Other

## 2014-10-20 ENCOUNTER — Ambulatory Visit (INDEPENDENT_AMBULATORY_CARE_PROVIDER_SITE_OTHER): Payer: Medicare Other | Admitting: Critical Care Medicine

## 2014-10-20 DIAGNOSIS — J432 Centrilobular emphysema: Secondary | ICD-10-CM

## 2014-10-21 NOTE — Progress Notes (Signed)
Opened in erroer

## 2014-11-02 DIAGNOSIS — G894 Chronic pain syndrome: Secondary | ICD-10-CM | POA: Diagnosis not present

## 2014-11-02 DIAGNOSIS — M5441 Lumbago with sciatica, right side: Secondary | ICD-10-CM | POA: Diagnosis not present

## 2014-11-02 DIAGNOSIS — Z9689 Presence of other specified functional implants: Secondary | ICD-10-CM | POA: Diagnosis not present

## 2014-11-02 DIAGNOSIS — R51 Headache: Secondary | ICD-10-CM | POA: Diagnosis not present

## 2014-11-02 DIAGNOSIS — G8929 Other chronic pain: Secondary | ICD-10-CM | POA: Diagnosis not present

## 2014-11-02 DIAGNOSIS — M5136 Other intervertebral disc degeneration, lumbar region: Secondary | ICD-10-CM | POA: Diagnosis not present

## 2014-11-08 DIAGNOSIS — Z23 Encounter for immunization: Secondary | ICD-10-CM | POA: Diagnosis not present

## 2014-11-09 ENCOUNTER — Inpatient Hospital Stay (HOSPITAL_COMMUNITY): Payer: Medicare Other

## 2014-11-09 ENCOUNTER — Emergency Department (HOSPITAL_COMMUNITY): Payer: Medicare Other

## 2014-11-09 ENCOUNTER — Encounter (HOSPITAL_COMMUNITY): Payer: Self-pay | Admitting: Emergency Medicine

## 2014-11-09 ENCOUNTER — Inpatient Hospital Stay (HOSPITAL_COMMUNITY)
Admission: EM | Admit: 2014-11-09 | Discharge: 2014-11-18 | DRG: 871 | Disposition: A | Discharge status: Home Health | Payer: Medicare Other | Attending: Internal Medicine | Admitting: Internal Medicine

## 2014-11-09 DIAGNOSIS — Z885 Allergy status to narcotic agent status: Secondary | ICD-10-CM | POA: Diagnosis not present

## 2014-11-09 DIAGNOSIS — D696 Thrombocytopenia, unspecified: Secondary | ICD-10-CM | POA: Diagnosis not present

## 2014-11-09 DIAGNOSIS — B9620 Unspecified Escherichia coli [E. coli] as the cause of diseases classified elsewhere: Secondary | ICD-10-CM | POA: Diagnosis present

## 2014-11-09 DIAGNOSIS — K567 Ileus, unspecified: Secondary | ICD-10-CM | POA: Diagnosis present

## 2014-11-09 DIAGNOSIS — J690 Pneumonitis due to inhalation of food and vomit: Secondary | ICD-10-CM | POA: Diagnosis not present

## 2014-11-09 DIAGNOSIS — K529 Noninfective gastroenteritis and colitis, unspecified: Secondary | ICD-10-CM | POA: Diagnosis present

## 2014-11-09 DIAGNOSIS — J449 Chronic obstructive pulmonary disease, unspecified: Secondary | ICD-10-CM | POA: Diagnosis present

## 2014-11-09 DIAGNOSIS — K5909 Other constipation: Secondary | ICD-10-CM | POA: Diagnosis present

## 2014-11-09 DIAGNOSIS — Z87891 Personal history of nicotine dependence: Secondary | ICD-10-CM

## 2014-11-09 DIAGNOSIS — D649 Anemia, unspecified: Secondary | ICD-10-CM | POA: Diagnosis present

## 2014-11-09 DIAGNOSIS — I509 Heart failure, unspecified: Secondary | ICD-10-CM | POA: Diagnosis not present

## 2014-11-09 DIAGNOSIS — R739 Hyperglycemia, unspecified: Secondary | ICD-10-CM | POA: Diagnosis present

## 2014-11-09 DIAGNOSIS — J9690 Respiratory failure, unspecified, unspecified whether with hypoxia or hypercapnia: Secondary | ICD-10-CM | POA: Diagnosis not present

## 2014-11-09 DIAGNOSIS — Z87440 Personal history of urinary (tract) infections: Secondary | ICD-10-CM | POA: Diagnosis not present

## 2014-11-09 DIAGNOSIS — Z7951 Long term (current) use of inhaled steroids: Secondary | ICD-10-CM

## 2014-11-09 DIAGNOSIS — J431 Panlobular emphysema: Secondary | ICD-10-CM | POA: Diagnosis not present

## 2014-11-09 DIAGNOSIS — E7800 Pure hypercholesterolemia, unspecified: Secondary | ICD-10-CM | POA: Diagnosis present

## 2014-11-09 DIAGNOSIS — H9190 Unspecified hearing loss, unspecified ear: Secondary | ICD-10-CM | POA: Diagnosis present

## 2014-11-09 DIAGNOSIS — Z7982 Long term (current) use of aspirin: Secondary | ICD-10-CM

## 2014-11-09 DIAGNOSIS — K5730 Diverticulosis of large intestine without perforation or abscess without bleeding: Secondary | ICD-10-CM | POA: Diagnosis present

## 2014-11-09 DIAGNOSIS — I5043 Acute on chronic combined systolic (congestive) and diastolic (congestive) heart failure: Secondary | ICD-10-CM | POA: Diagnosis not present

## 2014-11-09 DIAGNOSIS — N179 Acute kidney failure, unspecified: Secondary | ICD-10-CM | POA: Diagnosis not present

## 2014-11-09 DIAGNOSIS — Z978 Presence of other specified devices: Secondary | ICD-10-CM

## 2014-11-09 DIAGNOSIS — J9600 Acute respiratory failure, unspecified whether with hypoxia or hypercapnia: Secondary | ICD-10-CM

## 2014-11-09 DIAGNOSIS — E876 Hypokalemia: Secondary | ICD-10-CM | POA: Diagnosis not present

## 2014-11-09 DIAGNOSIS — Z888 Allergy status to other drugs, medicaments and biological substances status: Secondary | ICD-10-CM | POA: Diagnosis not present

## 2014-11-09 DIAGNOSIS — Z8249 Family history of ischemic heart disease and other diseases of the circulatory system: Secondary | ICD-10-CM | POA: Diagnosis not present

## 2014-11-09 DIAGNOSIS — A419 Sepsis, unspecified organism: Principal | ICD-10-CM

## 2014-11-09 DIAGNOSIS — M810 Age-related osteoporosis without current pathological fracture: Secondary | ICD-10-CM | POA: Diagnosis present

## 2014-11-09 DIAGNOSIS — I10 Essential (primary) hypertension: Secondary | ICD-10-CM | POA: Diagnosis not present

## 2014-11-09 DIAGNOSIS — R51 Headache: Secondary | ICD-10-CM | POA: Diagnosis present

## 2014-11-09 DIAGNOSIS — I110 Hypertensive heart disease with heart failure: Secondary | ICD-10-CM | POA: Diagnosis present

## 2014-11-09 DIAGNOSIS — Z882 Allergy status to sulfonamides status: Secondary | ICD-10-CM | POA: Diagnosis not present

## 2014-11-09 DIAGNOSIS — R6521 Severe sepsis with septic shock: Secondary | ICD-10-CM

## 2014-11-09 DIAGNOSIS — R112 Nausea with vomiting, unspecified: Secondary | ICD-10-CM | POA: Diagnosis not present

## 2014-11-09 DIAGNOSIS — A4151 Sepsis due to Escherichia coli [E. coli]: Secondary | ICD-10-CM | POA: Diagnosis not present

## 2014-11-09 DIAGNOSIS — M419 Scoliosis, unspecified: Secondary | ICD-10-CM | POA: Diagnosis present

## 2014-11-09 DIAGNOSIS — R109 Unspecified abdominal pain: Secondary | ICD-10-CM | POA: Diagnosis not present

## 2014-11-09 DIAGNOSIS — N390 Urinary tract infection, site not specified: Secondary | ICD-10-CM | POA: Diagnosis not present

## 2014-11-09 DIAGNOSIS — K2970 Gastritis, unspecified, without bleeding: Secondary | ICD-10-CM | POA: Diagnosis not present

## 2014-11-09 DIAGNOSIS — Z8711 Personal history of peptic ulcer disease: Secondary | ICD-10-CM

## 2014-11-09 DIAGNOSIS — J439 Emphysema, unspecified: Secondary | ICD-10-CM | POA: Diagnosis present

## 2014-11-09 DIAGNOSIS — Z452 Encounter for adjustment and management of vascular access device: Secondary | ICD-10-CM | POA: Diagnosis not present

## 2014-11-09 DIAGNOSIS — I5021 Acute systolic (congestive) heart failure: Secondary | ICD-10-CM | POA: Diagnosis not present

## 2014-11-09 DIAGNOSIS — K5733 Diverticulitis of large intestine without perforation or abscess with bleeding: Secondary | ICD-10-CM | POA: Diagnosis not present

## 2014-11-09 DIAGNOSIS — Z79891 Long term (current) use of opiate analgesic: Secondary | ICD-10-CM | POA: Diagnosis not present

## 2014-11-09 DIAGNOSIS — K5732 Diverticulitis of large intestine without perforation or abscess without bleeding: Secondary | ICD-10-CM | POA: Diagnosis not present

## 2014-11-09 DIAGNOSIS — R0602 Shortness of breath: Secondary | ICD-10-CM | POA: Diagnosis not present

## 2014-11-09 LAB — POCT I-STAT 3, ART BLOOD GAS (G3+)
Acid-base deficit: 3 mmol/L — ABNORMAL HIGH (ref 0.0–2.0)
Bicarbonate: 24.1 mEq/L — ABNORMAL HIGH (ref 20.0–24.0)
O2 Saturation: 99 %
Patient temperature: 98
TCO2: 26 mmol/L (ref 0–100)
pCO2 arterial: 48.8 mmHg — ABNORMAL HIGH (ref 35.0–45.0)
pH, Arterial: 7.299 — ABNORMAL LOW (ref 7.350–7.450)
pO2, Arterial: 145 mmHg — ABNORMAL HIGH (ref 80.0–100.0)

## 2014-11-09 LAB — URINE MICROSCOPIC-ADD ON

## 2014-11-09 LAB — CBC WITH DIFFERENTIAL/PLATELET
Basophils Absolute: 0 10*3/uL (ref 0.0–0.1)
Basophils Relative: 0 %
Eosinophils Absolute: 0 10*3/uL (ref 0.0–0.7)
Eosinophils Relative: 0 %
HCT: 45.3 % (ref 36.0–46.0)
Hemoglobin: 14.7 g/dL (ref 12.0–15.0)
Lymphocytes Relative: 1 %
Lymphs Abs: 0.3 10*3/uL — ABNORMAL LOW (ref 0.7–4.0)
MCH: 30.8 pg (ref 26.0–34.0)
MCHC: 32.5 g/dL (ref 30.0–36.0)
MCV: 94.8 fL (ref 78.0–100.0)
Monocytes Absolute: 1.4 10*3/uL — ABNORMAL HIGH (ref 0.1–1.0)
Monocytes Relative: 6 %
Neutro Abs: 21 10*3/uL — ABNORMAL HIGH (ref 1.7–7.7)
Neutrophils Relative %: 92 %
Platelets: 203 10*3/uL (ref 150–400)
RBC: 4.78 MIL/uL (ref 3.87–5.11)
RDW: 13.6 % (ref 11.5–15.5)
WBC: 22.8 10*3/uL — ABNORMAL HIGH (ref 4.0–10.5)

## 2014-11-09 LAB — CBC
HCT: 45.1 % (ref 36.0–46.0)
Hemoglobin: 14.4 g/dL (ref 12.0–15.0)
MCH: 30.2 pg (ref 26.0–34.0)
MCHC: 31.9 g/dL (ref 30.0–36.0)
MCV: 94.5 fL (ref 78.0–100.0)
Platelets: 181 10*3/uL (ref 150–400)
RBC: 4.77 MIL/uL (ref 3.87–5.11)
RDW: 13.8 % (ref 11.5–15.5)
WBC: 19.1 10*3/uL — ABNORMAL HIGH (ref 4.0–10.5)

## 2014-11-09 LAB — BASIC METABOLIC PANEL
Anion gap: 5 (ref 5–15)
Anion gap: 7 (ref 5–15)
BUN: 23 mg/dL — ABNORMAL HIGH (ref 6–20)
BUN: 23 mg/dL — ABNORMAL HIGH (ref 6–20)
CO2: 25 mmol/L (ref 22–32)
CO2: 25 mmol/L (ref 22–32)
Calcium: 7.5 mg/dL — ABNORMAL LOW (ref 8.9–10.3)
Calcium: 7.4 mg/dL — ABNORMAL LOW (ref 8.9–10.3)
Chloride: 105 mmol/L (ref 101–111)
Chloride: 101 mmol/L (ref 101–111)
Creatinine, Ser: 1.12 mg/dL — ABNORMAL HIGH (ref 0.44–1.00)
Creatinine, Ser: 1 mg/dL (ref 0.44–1.00)
GFR calc Af Amer: 50 mL/min — ABNORMAL LOW (ref >60)
GFR calc Af Amer: 57 mL/min — ABNORMAL LOW (ref >60)
GFR calc non Af Amer: 43 mL/min — ABNORMAL LOW (ref >60)
GFR calc non Af Amer: 49 mL/min — ABNORMAL LOW (ref >60)
Glucose, Bld: 144 mg/dL — ABNORMAL HIGH (ref 65–99)
Glucose, Bld: 152 mg/dL — ABNORMAL HIGH (ref 65–99)
Potassium: 6 mmol/L — ABNORMAL HIGH (ref 3.5–5.1)
Potassium: 4.5 mmol/L (ref 3.5–5.1)
Sodium: 135 mmol/L (ref 135–145)
Sodium: 133 mmol/L — ABNORMAL LOW (ref 135–145)

## 2014-11-09 LAB — GLUCOSE, CAPILLARY
Glucose-Capillary: 143 mg/dL — ABNORMAL HIGH (ref 65–99)
Glucose-Capillary: 160 mg/dL — ABNORMAL HIGH (ref 65–99)

## 2014-11-09 LAB — URINALYSIS, ROUTINE W REFLEX MICROSCOPIC
Bilirubin Urine: NEGATIVE
Glucose, UA: NEGATIVE mg/dL
Ketones, ur: 15 mg/dL — AB
Nitrite: POSITIVE — AB
Protein, ur: 30 mg/dL — AB
Specific Gravity, Urine: 1.016 (ref 1.005–1.030)
Urobilinogen, UA: 0.2 mg/dL (ref 0.0–1.0)
pH: 6 (ref 5.0–8.0)

## 2014-11-09 LAB — COMPREHENSIVE METABOLIC PANEL
ALT: 24 U/L (ref 14–54)
AST: 46 U/L — ABNORMAL HIGH (ref 15–41)
Albumin: 3.4 g/dL — ABNORMAL LOW (ref 3.5–5.0)
Alkaline Phosphatase: 95 U/L (ref 38–126)
Anion gap: 12 (ref 5–15)
BUN: 17 mg/dL (ref 6–20)
CO2: 28 mmol/L (ref 22–32)
Calcium: 8.8 mg/dL — ABNORMAL LOW (ref 8.9–10.3)
Chloride: 98 mmol/L — ABNORMAL LOW (ref 101–111)
Creatinine, Ser: 1.02 mg/dL — ABNORMAL HIGH (ref 0.44–1.00)
GFR calc Af Amer: 56 mL/min — ABNORMAL LOW (ref >60)
GFR calc non Af Amer: 48 mL/min — ABNORMAL LOW (ref >60)
Glucose, Bld: 168 mg/dL — ABNORMAL HIGH (ref 65–99)
Potassium: 3.3 mmol/L — ABNORMAL LOW (ref 3.5–5.1)
Sodium: 138 mmol/L (ref 135–145)
Total Bilirubin: 0.4 mg/dL (ref 0.3–1.2)
Total Protein: 6.3 g/dL — ABNORMAL LOW (ref 6.5–8.1)

## 2014-11-09 LAB — I-STAT CG4 LACTIC ACID, ED
Lactic Acid, Venous: 3.16 mmol/L (ref 0.5–2.0)
Lactic Acid, Venous: 2.11 mmol/L (ref 0.5–2.0)

## 2014-11-09 LAB — PROCALCITONIN: Procalcitonin: 50.08 ng/mL

## 2014-11-09 LAB — BRAIN NATRIURETIC PEPTIDE: B Natriuretic Peptide: 827.2 pg/mL — ABNORMAL HIGH (ref 0.0–100.0)

## 2014-11-09 LAB — LIPASE, BLOOD: Lipase: 22 U/L (ref 22–51)

## 2014-11-09 LAB — CORTISOL: Cortisol, Plasma: 33.6 ug/dL

## 2014-11-09 LAB — CREATININE, SERUM
Creatinine, Ser: 1.06 mg/dL — ABNORMAL HIGH (ref 0.44–1.00)
GFR calc Af Amer: 53 mL/min — ABNORMAL LOW (ref >60)
GFR calc non Af Amer: 46 mL/min — ABNORMAL LOW (ref >60)

## 2014-11-09 LAB — LACTIC ACID, PLASMA: Lactic Acid, Venous: 2.7 mmol/L (ref 0.5–2.0)

## 2014-11-09 LAB — MRSA PCR SCREENING: MRSA by PCR: NEGATIVE

## 2014-11-09 MED ORDER — ACETAMINOPHEN 650 MG RE SUPP: 650.0000 mg | Freq: Once | RECTAL | Status: DC

## 2014-11-09 MED ORDER — SODIUM CHLORIDE 0.9 % IV BOLUS (SEPSIS)
1000.0000 mL | Freq: Once | INTRAVENOUS | Status: DC
Start: 2014-11-09 — End: 2014-11-09

## 2014-11-09 MED ORDER — LACTULOSE 10 GM/15ML PO SOLN
10.0000 g | Freq: Every day | ORAL | Status: DC | PRN
  Administered 2014-11-16: 10 g via ORAL
  Filled 2014-11-09 (×2): qty 15

## 2014-11-09 MED ORDER — ALBUTEROL SULFATE (2.5 MG/3ML) 0.083% IN NEBU: 2.5000 mg | INHALATION_SOLUTION | Freq: Three times a day (TID) | RESPIRATORY_TRACT | Status: DC | PRN

## 2014-11-09 MED ORDER — BIOGAIA PROBIOTIC PO LIQD: 15.0000 mL | Freq: Every day | ORAL | Status: DC

## 2014-11-09 MED ORDER — ONDANSETRON HCL 4 MG/2ML IJ SOLN
4.0000 mg | Freq: Once | INTRAMUSCULAR | Status: AC
  Administered 2014-11-09: 4 mg via INTRAVENOUS
  Filled 2014-11-09: qty 2

## 2014-11-09 MED ORDER — SODIUM CHLORIDE 0.9 % IV BOLUS (SEPSIS)
1000.0000 mL | Freq: Once | INTRAVENOUS | Status: AC
  Administered 2014-11-09: 1000 mL via INTRAVENOUS

## 2014-11-09 MED ORDER — ONDANSETRON HCL 4 MG PO TABS: 4.0000 mg | ORAL_TABLET | Freq: Four times a day (QID) | ORAL | Status: DC | PRN

## 2014-11-09 MED ORDER — ARFORMOTEROL TARTRATE 15 MCG/2ML IN NEBU
15.0000 ug | INHALATION_SOLUTION | Freq: Two times a day (BID) | RESPIRATORY_TRACT | Status: DC
  Administered 2014-11-09 – 2014-11-18 (×17): 15 ug via RESPIRATORY_TRACT
  Filled 2014-11-09 (×24): qty 2

## 2014-11-09 MED ORDER — IOHEXOL 300 MG/ML  SOLN
80.0000 mL | Freq: Once | INTRAMUSCULAR | Status: AC | PRN
  Administered 2014-11-09: 100 mL via INTRAVENOUS

## 2014-11-09 MED ORDER — PIPERACILLIN-TAZOBACTAM 3.375 G IVPB
3.3750 g | Freq: Three times a day (TID) | INTRAVENOUS | Status: DC
  Administered 2014-11-09 – 2014-11-12 (×9): 3.375 g via INTRAVENOUS
  Filled 2014-11-09 (×12): qty 50

## 2014-11-09 MED ORDER — GABAPENTIN 100 MG PO CAPS
100.0000 mg | ORAL_CAPSULE | Freq: Two times a day (BID) | ORAL | Status: DC
  Administered 2014-11-09 – 2014-11-18 (×18): 100 mg via ORAL
  Filled 2014-11-09 (×20): qty 1

## 2014-11-09 MED ORDER — GUAIFENESIN ER 600 MG PO TB12
600.0000 mg | ORAL_TABLET | Freq: Two times a day (BID) | ORAL | Status: DC
  Administered 2014-11-09 – 2014-11-18 (×18): 600 mg via ORAL
  Filled 2014-11-09 (×20): qty 1

## 2014-11-09 MED ORDER — ACETAMINOPHEN 650 MG RE SUPP: 975.0000 mg | Freq: Once | RECTAL | Status: DC

## 2014-11-09 MED ORDER — SODIUM CHLORIDE 0.9 % IV BOLUS (SEPSIS)
500.0000 mL | Freq: Once | INTRAVENOUS | Status: AC
  Administered 2014-11-09: 500 mL via INTRAVENOUS

## 2014-11-09 MED ORDER — PHENYLEPHRINE HCL 10 MG/ML IJ SOLN
30.0000 ug/min | INTRAMUSCULAR | Status: DC
  Administered 2014-11-09: 40 ug/min via INTRAVENOUS
  Administered 2014-11-09: 80 ug/min via INTRAVENOUS
  Filled 2014-11-09: qty 1

## 2014-11-09 MED ORDER — ALBUTEROL SULFATE HFA 108 (90 BASE) MCG/ACT IN AERS: 1.0000 | INHALATION_SPRAY | Freq: Three times a day (TID) | RESPIRATORY_TRACT | Status: DC | PRN

## 2014-11-09 MED ORDER — PHENYLEPHRINE HCL 10 MG/ML IJ SOLN
30.0000 ug/min | INTRAMUSCULAR | Status: DC
  Administered 2014-11-09 (×2): 160 ug/min via INTRAVENOUS
  Administered 2014-11-10: 145 ug/min via INTRAVENOUS
  Administered 2014-11-10: 85 ug/min via INTRAVENOUS
  Administered 2014-11-10: 135 ug/min via INTRAVENOUS
  Administered 2014-11-10: 120 ug/min via INTRAVENOUS
  Filled 2014-11-09 (×7): qty 4

## 2014-11-09 MED ORDER — SODIUM CHLORIDE 0.9 % IV SOLN: 250.0000 mL | INTRAVENOUS | Status: DC | PRN

## 2014-11-09 MED ORDER — SODIUM CHLORIDE 0.9 % IV BOLUS (SEPSIS): 500.0000 mL | Freq: Once | INTRAVENOUS | Status: DC

## 2014-11-09 MED ORDER — CETYLPYRIDINIUM CHLORIDE 0.05 % MT LIQD
7.0000 mL | Freq: Two times a day (BID) | OROMUCOSAL | Status: DC
  Administered 2014-11-09 – 2014-11-18 (×19): 7 mL via OROMUCOSAL

## 2014-11-09 MED ORDER — HEPARIN SODIUM (PORCINE) 5000 UNIT/ML IJ SOLN
5000.0000 [IU] | Freq: Three times a day (TID) | INTRAMUSCULAR | Status: DC
  Administered 2014-11-09 – 2014-11-12 (×9): 5000 [IU] via SUBCUTANEOUS
  Filled 2014-11-09 (×10): qty 1

## 2014-11-09 MED ORDER — PIPERACILLIN-TAZOBACTAM 3.375 G IVPB 30 MIN: 3.3750 g | Freq: Once | INTRAVENOUS | Status: DC

## 2014-11-09 MED ORDER — VANCOMYCIN HCL IN DEXTROSE 1-5 GM/200ML-% IV SOLN
1000.0000 mg | Freq: Once | INTRAVENOUS | Status: AC
Start: 2014-11-09 — End: 2014-11-09
  Administered 2014-11-09: 1000 mg via INTRAVENOUS
  Filled 2014-11-09: qty 200

## 2014-11-09 MED ORDER — BUDESONIDE 0.25 MG/2ML IN SUSP
0.5000 mg | Freq: Two times a day (BID) | RESPIRATORY_TRACT | Status: DC
  Filled 2014-11-09 (×3): qty 4

## 2014-11-09 MED ORDER — NOREPINEPHRINE BITARTRATE 1 MG/ML IV SOLN
2.0000 ug/min | INTRAVENOUS | Status: DC
  Administered 2014-11-09: 10 ug/min via INTRAVENOUS
  Administered 2014-11-09: 2 ug/min via INTRAVENOUS
  Filled 2014-11-09 (×2): qty 4

## 2014-11-09 MED ORDER — ASPIRIN 325 MG PO TABS
325.0000 mg | ORAL_TABLET | Freq: Every day | ORAL | Status: DC
  Administered 2014-11-10 – 2014-11-18 (×9): 325 mg via ORAL
  Filled 2014-11-09 (×10): qty 1

## 2014-11-09 MED ORDER — SODIUM CHLORIDE 0.9 % IJ SOLN
3.0000 mL | Freq: Two times a day (BID) | INTRAMUSCULAR | Status: DC
  Administered 2014-11-09 – 2014-11-18 (×19): 3 mL via INTRAVENOUS

## 2014-11-09 MED ORDER — SODIUM CHLORIDE 0.9 % IV SOLN
2.0000 g | Freq: Once | INTRAVENOUS | Status: AC
  Administered 2014-11-09: 2 g via INTRAVENOUS
  Filled 2014-11-09: qty 20

## 2014-11-09 MED ORDER — PIPERACILLIN-TAZOBACTAM 3.375 G IVPB 30 MIN
3.3750 g | Freq: Once | INTRAVENOUS | Status: AC
  Administered 2014-11-09: 3.375 g via INTRAVENOUS
  Filled 2014-11-09: qty 50

## 2014-11-09 MED ORDER — POTASSIUM CHLORIDE 10 MEQ/50ML IV SOLN
10.0000 meq | INTRAVENOUS | Status: AC
  Administered 2014-11-09 (×4): 10 meq via INTRAVENOUS
  Filled 2014-11-09 (×3): qty 50

## 2014-11-09 MED ORDER — RISAQUAD PO CAPS
1.0000 | ORAL_CAPSULE | Freq: Every day | ORAL | Status: DC
  Administered 2014-11-10 – 2014-11-18 (×9): 1 via ORAL
  Filled 2014-11-09 (×10): qty 1

## 2014-11-09 MED ORDER — SODIUM CHLORIDE 0.9 % IV BOLUS (SEPSIS): 500.0000 mL | INTRAVENOUS | Status: DC

## 2014-11-09 MED ORDER — ACETAMINOPHEN 650 MG RE SUPP
650.0000 mg | Freq: Once | RECTAL | Status: AC
  Administered 2014-11-09: 650 mg via RECTAL
  Filled 2014-11-09: qty 1

## 2014-11-09 MED ORDER — PHENYLEPHRINE HCL 10 MG/ML IJ SOLN
30.0000 ug/min | INTRAMUSCULAR | Status: DC
  Administered 2014-11-09: 120 ug/min via INTRAVENOUS
  Filled 2014-11-09 (×2): qty 2

## 2014-11-09 MED ORDER — OXYCODONE-ACETAMINOPHEN 5-325 MG PO TABS
1.0000 | ORAL_TABLET | ORAL | Status: DC | PRN
  Administered 2014-11-09 – 2014-11-17 (×14): 1 via ORAL
  Filled 2014-11-09 (×14): qty 1

## 2014-11-09 MED ORDER — SODIUM CHLORIDE 0.9 % IJ SOLN: 3.0000 mL | INTRAMUSCULAR | Status: DC | PRN

## 2014-11-09 MED ORDER — DILTIAZEM HCL 25 MG/5ML IV SOLN
10.0000 mg | Freq: Once | INTRAVENOUS | Status: AC
  Administered 2014-11-09: 10 mg via INTRAVENOUS
  Filled 2014-11-09: qty 5

## 2014-11-09 MED ORDER — IBUPROFEN 400 MG PO TABS
600.0000 mg | ORAL_TABLET | Freq: Once | ORAL | Status: AC
  Administered 2014-11-09: 600 mg via ORAL
  Filled 2014-11-09 (×2): qty 1

## 2014-11-09 MED ORDER — FLUTICASONE PROPIONATE 50 MCG/ACT NA SUSP
2.0000 | Freq: Every day | NASAL | Status: DC
  Administered 2014-11-09 – 2014-11-12 (×4): 2 via NASAL
  Filled 2014-11-09: qty 16

## 2014-11-09 MED ORDER — ENOXAPARIN SODIUM 40 MG/0.4ML ~~LOC~~ SOLN: 40.0000 mg | SUBCUTANEOUS | Status: DC

## 2014-11-09 MED ORDER — BUDESONIDE 0.5 MG/2ML IN SUSP
0.5000 mg | Freq: Two times a day (BID) | RESPIRATORY_TRACT | Status: DC
  Administered 2014-11-09 – 2014-11-18 (×18): 0.5 mg via RESPIRATORY_TRACT
  Filled 2014-11-09 (×21): qty 2

## 2014-11-09 MED ORDER — ONDANSETRON HCL 4 MG/2ML IJ SOLN
4.0000 mg | Freq: Four times a day (QID) | INTRAMUSCULAR | Status: DC | PRN
  Administered 2014-11-14 – 2014-11-15 (×2): 4 mg via INTRAVENOUS
  Filled 2014-11-09 (×3): qty 2

## 2014-11-09 MED ORDER — PANTOPRAZOLE SODIUM 40 MG PO TBEC
40.0000 mg | DELAYED_RELEASE_TABLET | Freq: Every day | ORAL | Status: DC
  Administered 2014-11-10 – 2014-11-18 (×9): 40 mg via ORAL
  Filled 2014-11-09 (×9): qty 1

## 2014-11-09 NOTE — Progress Notes (Signed)
Dr. Melvyn Novas was notified of potassium value of 6.  No new orders at this time.

## 2014-11-09 NOTE — Progress Notes (Signed)
Patient is alert and oriented. Patient has been educated on benefits and risks of central line. Consent has been signed by patient and placed in the chart. Patient's Bp 91/52 HR 124 RR 14 Sats 97% on 4L nasal cannula. PA to place central line.

## 2014-11-09 NOTE — ED Notes (Signed)
Critical care at bedside, aware of pt BP.

## 2014-11-09 NOTE — ED Notes (Signed)
Patient here with nausea, vomiting and abdominal pain since 4:30 yesterday afternoon.  Patient has history of pancolitis and diverticulitis.

## 2014-11-09 NOTE — Progress Notes (Signed)
Called Dr. Lake Bells about patient's Bp. Received order not to give fluids because patient had received 2.5 L in ED. MD does not want patient to go on sepsis protocol. Will continue to titrate pressors and continue with current plan of care.

## 2014-11-09 NOTE — ED Notes (Addendum)
Critical care MD updated on BP 77/50. MD states to keep on Levo until pt goes upstairs to get a [employer].

## 2014-11-09 NOTE — ED Provider Notes (Signed)
CSN: [id number]     Arrival date & time 11/09/14  0247 History  By signing my name below, I, [name], attest that this documentation has been prepared under the direction and in the presence of Everlene Balls, MD . Electronically Signed: [name], Scribe. 11/09/2014. 3:12 AM.  Chief Complaint  Patient presents with  . Nausea  . Emesis  . Abdominal Pain    LEVEL 5 CAVEAT DUE TO AGE  The history is provided by a relative (GrandDaughter). No language interpreter was used.   HPI Comments:  [name] [name] is a 81 y.o. female with a history PUD, diverticulitis, and pancreatitis, who presents to the Emergency Department via EMS with granddaughter who reports abdominal pain with associated nausea and vomiting (~ 5 episodes) that began yesterday ~1630. Granddaughter states this is how pt presented with past episode of pancreatitis; reports ~5-6 similar episodes a  year. She also notes pt has a pain pump with narcotics for chronic pain and is often constipated. No alleviating factors noted.  Past Medical History  Diagnosis Date  . Osteoporosis   . GERD (gastroesophageal reflux disease)   . Hiatal hernia   . PUD (peptic ulcer disease)   . Cardiac arrhythmia   . Hyperlipidemia   . Hypertension   . Scoliosis deformity of spine     history of intractable back pain  . Choledocholithiasis   . Pancolitis (HCC)     Infectious vs. inflammatory  . Abdominal pain     Resolved  . Chronic anemia   . Chronic back pain   . Pancreatitis     History of  . CHF (congestive heart failure) ([place])     Diastolic  on ECHO 0093  . COPD (chronic obstructive pulmonary disease) (HCC)     oxygen dependent at  night 2LPM  . PONV (postoperative nausea and vomiting)   . Reactive airway disease that is not asthma   . HCAP (healthcare-associated pneumonia) 03/28/2014  . Pneumonia 1930's; 2011 X 2; 02/2014  . On home oxygen therapy     "1L during the day; 2L q hs" (03/28/2014)  . Daily headache   .  Osteoarthritis     "pretty much q where"   . Recurrent UTI (urinary tract infection)     "here lately" (03/28/2014)   Past Surgical History  Procedure Laterality Date  . Cholecystectomy  2008  . Abdominal hysterectomy  1970s  . Spine surgery  [phone]  . Cervical laminectomy  1970s  . Spinal cord stimulator implant  ~ 2009  . Ercp w/ sphicterotomy  02/2010  . Pain pump implantation  ~ 2010    back, dilaudid and bupravacaine  . Flexible sigmoidoscopy N/A 01/24/2013    Procedure: FLEXIBLE SIGMOIDOSCOPY;  Surgeon: Missy Sabins, MD;  Location: [facility];  Service: Endoscopy;  Laterality: N/A;  . Esophagogastroduodenoscopy N/A 11/14/2013    Procedure: ESOPHAGOGASTRODUODENOSCOPY (EGD);  Surgeon: Missy Sabins, MD;  Location: [facility] ENDOSCOPY;  Service: Endoscopy;  Laterality: N/A;  . Breast cyst excision Right 1960's  . Back surgery      x 5   Family History  Problem Relation Age of Onset  . Cancer Mother     uterine  . Heart disease Father   . Hypertension Other    Social History  Substance Use Topics  . Smoking status: Former Smoker -- 1.00 packs/day for 12 years    Types: Cigarettes    Quit date: 01/28/1988  . Smokeless tobacco: Never Used  . Alcohol Use:  No   OB History    No data available     Review of Systems  Unable to perform ROS: Age    Allergies  Codeine; Morphine; Sulfa antibiotics; and Zoledronic acid  Home Medications   Prior to Admission medications   Medication Sig Start Date End Date Taking? Authorizing Provider  acetaminophen (TYLENOL) 325 MG tablet Take 2 tablets (650 mg total) by mouth every 6 (six) hours as needed for mild pain (or Fever >/= 101). 03/09/14   Robbie Lis, MD  albuterol (PROVENTIL HFA;VENTOLIN HFA) 108 (90 BASE) MCG/ACT inhaler Inhale 1-2 puffs into the lungs 3 (three) times daily as needed for wheezing or shortness of breath (wheezing & shortness of breath).     Historical Provider, MD  Ascorbic Acid (VITAMIN C) 500 MG tablet  Take 500 mg by mouth daily.      Historical Provider, MD  aspirin 325 MG tablet Take 1 tablet (325 mg total) by mouth daily. 03/09/14   Robbie Lis, MD  budesonide-formoterol [facility]) 160-4.5 MCG/ACT inhaler Inhale 2 puffs into the lungs 2 (two) times daily. 03/30/14   Donne Hazel, MD  butalbital-acetaminophen-caffeine (FIORICET, ESGIC) [phone] MG per tablet Take 1 tablet by mouth every 8 (eight) hours as needed for headache (headache).     Historical Provider, MD  Calcium Carbonate-Vitamin D (CALCIUM 600-D) 600-400 MG-UNIT per tablet Take 1 tablet by mouth 2 (two) times daily with a meal.      Historical Provider, MD  Clotrimazole 1 % OINT Take 1 application by mouth 2 (two) times daily as needed (mouth sores).  10/31/13   Debbrah Alar, NP  fluticasone (FLONASE) 50 MCG/ACT nasal spray Place 2 sprays into both nostrils daily. 04/19/14   Brunetta Jeans, PA-C  gabapentin (NEURONTIN) 100 MG capsule Take 1 capsule (100 mg total) by mouth 2 (two) times daily. 08/23/13   Debbrah Alar, NP  guaiFENesin (MUCINEX) 600 MG 12 hr tablet Take 600 mg by mouth daily as needed.    Historical Provider, MD  lactulose (CHRONULAC) 10 GM/15ML solution Take 10 g by mouth daily as needed for mild constipation (constipation). 10 ml by mouth once daily at bedtime 03/30/13   Debbrah Alar, NP  metoprolol (LOPRESSOR) 50 MG tablet Take 1 tablet (50 mg total) by mouth 2 (two) times daily. 04/11/14   Debbrah Alar, NP  nitroGLYCERIN (NITROSTAT) 0.4 MG SL tablet Place 1 tablet (0.4 mg total) under the tongue every 5 (five) minutes as needed for chest pain. 11/11/10   Darlin Coco, MD  omeprazole (PRILOSEC) 40 MG capsule Take 1 capsule (40 mg total) by mouth 2 (two) times daily. 11/16/13   Thurnell Lose, MD  ondansetron (ZOFRAN) 4 MG tablet Take 1 tablet (4 mg total) by mouth every 6 (six) hours as needed for nausea. 03/09/14   Robbie Lis, MD  oxyCODONE-acetaminophen (PERCOCET/ROXICET) 5-325 MG per  tablet Take 1 tablet by mouth every 6 (six) hours as needed for moderate pain or severe pain (pain). Patient taking differently: Take 1 tablet by mouth 2 (two) times daily.  03/09/14   Robbie Lis, MD  polyethylene glycol [facility] / Floria Raveling) packet Take 17 g by mouth daily as needed for mild constipation or moderate constipation. 01/27/13   Donne Hazel, MD  VITAMIN E PO Take 1 tablet by mouth daily.     Historical Provider, MD   BP 143/80 mmHg  Pulse 149  Temp(Src) 99.1 F (37.3 C) (Oral)  Resp 26  SpO2  97% Physical Exam  Constitutional: She appears well-developed and well-nourished. No distress.  HENT:  Head: Normocephalic and atraumatic.  Nose: Nose normal.  Mouth/Throat: Oropharynx is clear and moist. No oropharyngeal exudate.  Eyes: Conjunctivae and EOM are normal. Pupils are equal, round, and reactive to light. No scleral icterus.  Neck: Normal range of motion. Neck supple. No JVD present. No tracheal deviation present. No thyromegaly present.  Cardiovascular: Regular rhythm and normal heart sounds.  Tachycardia present.  Exam reveals no gallop and no friction rub.   No murmur heard. Pulmonary/Chest: Effort normal and breath sounds normal. No respiratory distress. She has no wheezes. She exhibits no tenderness.  Abdominal: Soft. Bowel sounds are normal. She exhibits no distension and no mass. There is no tenderness. There is no rebound and no guarding.  Pain pump to RLQ abdomen  Musculoskeletal: Normal range of motion. She exhibits no edema or tenderness.  Lymphadenopathy:    She has no cervical adenopathy.  Neurological: She is alert. No cranial nerve deficit. She exhibits normal muscle tone.  Drowsy but easily arousable   Skin: Skin is warm and dry. No rash noted. No erythema. No pallor.  Nursing note and vitals reviewed.   ED Course  Procedures   DIAGNOSTIC STUDIES:  Oxygen Saturation is 97% on RA, normal by my interpretation.    COORDINATION OF CARE:  3:06 AM  Discussed treatment plan with pt's daughter at bedside and she agreed to plan.  Labs Review Labs Reviewed  CBC WITH DIFFERENTIAL/PLATELET - Abnormal; Notable for the following:    WBC 22.8 (*)    Neutro Abs 21.0 (*)    Lymphs Abs 0.3 (*)    Monocytes Absolute 1.4 (*)    All other components within normal limits  COMPREHENSIVE METABOLIC PANEL - Abnormal; Notable for the following:    Potassium 3.3 (*)    Chloride 98 (*)    Glucose, Bld 168 (*)    Creatinine, Ser 1.02 (*)    Calcium 8.8 (*)    Total Protein 6.3 (*)    Albumin 3.4 (*)    AST 46 (*)    GFR calc non Af Amer 48 (*)    GFR calc Af Amer 56 (*)    All other components within normal limits  URINALYSIS, ROUTINE W REFLEX MICROSCOPIC (NOT AT [facility]) - Abnormal; Notable for the following:    APPearance CLOUDY (*)    Hgb urine dipstick SMALL (*)    Ketones, ur 15 (*)    Protein, ur 30 (*)    Nitrite POSITIVE (*)    Leukocytes, UA SMALL (*)    All other components within normal limits  URINE MICROSCOPIC-ADD ON - Abnormal; Notable for the following:    Bacteria, UA MANY (*)    All other components within normal limits  I-STAT CG4 LACTIC ACID, ED - Abnormal; Notable for the following:    Lactic Acid, Venous 3.16 (*)    All other components within normal limits  I-STAT CG4 LACTIC ACID, ED - Abnormal; Notable for the following:    Lactic Acid, Venous 2.11 (*)    All other components within normal limits  CULTURE, BLOOD (ROUTINE X 2)  CULTURE, BLOOD (ROUTINE X 2)  URINE CULTURE  LIPASE, BLOOD    Imaging Review Ct Abdomen Pelvis W Contrast  11/09/2014  CLINICAL DATA:  Nausea, vomiting and abdominal pain beginning 4:30 p.m. yesterday afternoon. History of pan colitis, diverticulitis, cholecystectomy, hysterectomy. EXAM: CT ABDOMEN AND PELVIS WITH CONTRAST TECHNIQUE: Multidetector CT imaging  of the abdomen and pelvis was performed using the standard protocol following bolus administration of intravenous contrast. CONTRAST:   183mL OMNIPAQUE IOHEXOL 300 MG/ML  SOLN COMPARISON:  CT abdomen and pelvis February 28, 2014 FINDINGS: LUNG BASES: Included view of the lung bases demonstrates small bilateral pleural effusions, patchy consolidation RIGHT middle and lower lobes included heart size is normal, mild Coronary artery calcifications. No pericardial effusions. Contrast in the distal esophagus likely represents reflux. With air bronchograms, tree-in-bud infiltrates RIGHT middle lobe. SOLID ORGANS: The liver mild biliary dilatation pneumobilia, status post cholecystectomy. Spleen, pancreas and adrenal glands are unremarkable. GASTROINTESTINAL TRACT: The stomach is normal in course and caliber without inflammatory changes. Mildly dilated proximal small bowel up to 3.4 cm without discrete transition point. Moderate sigmoid diverticulosis. Mild sigmoid and rectal wall thickening and edema. KIDNEYS/ URINARY TRACT: Kidneys are orthotopic, demonstrating symmetric enhancement. No nephrolithiasis, hydronephrosis or solid renal masses. Too small to characterize hypodensity lower pole RIGHT kidney. The unopacified ureters are normal in course and caliber. Delayed imaging through the kidneys demonstrates symmetric prompt contrast excretion within the proximal urinary collecting system. Urinary bladder is partially distended and unremarkable. PERITONEUM/RETROPERITONEUM: Aortoiliac vessels are normal in course and caliber, moderate to severe calcific atherosclerosis. No lymphadenopathy by CT size criteria. Internal reproductive organs are unremarkable. No intraperitoneal free fluid nor free air. SOFT TISSUE/OSSEOUS STRUCTURES: Non-suspicious. Spinal stimulator in RIGHT anterior abdominal wall, LEFT gluteal subcutaneous fat. Status post RIGHT L4-5, L5-S1 laminectomies. Severe dextroscoliosis and degenerative change of lumbar spine. IMPRESSION: Sigmoid and rectal wall thickening, could represent earlier chronic colitis in a background of diverticulosis  without convincing evidence of acute diverticulitis. Mildly dilated proximal small bowel suggest ileus without bowel obstruction. Small bilateral pleural effusion. RIGHT middle and lower lobe bronchopneumonia. Electronically Signed   By: Elon Alas M.D.   On: 11/09/2014 05:37   Dg Chest Port 1 View  11/09/2014  CLINICAL DATA:  Nausea, vomiting, and abdominal pain since 04/30 yesterday afternoon. Sepsis. EXAM: PORTABLE CHEST 1 VIEW COMPARISON:  04/12/2014 FINDINGS: Examination is limited due to nonstandard positioning and patient rotation. Shallow inspiration. Probable linear atelectasis in the lung bases. Patchy airspace disease in both lower lungs may represent atelectasis or pneumonia. No blunting of costophrenic angles. No pneumothorax. Normal heart size and pulmonary vascularity. Mediastinal contours appear intact. Thoracolumbar scoliosis. Electrical stimulator leads in the thoracic spine. Postoperative changes in the mandible. IMPRESSION: Shallow inspiration. Linear atelectasis in the lung bases. Airspace disease in the lower lung suggesting pneumonia or atelectasis. Electronically Signed   By: Lucienne Capers M.D.   On: 11/09/2014 04:33   I have personally reviewed and evaluated these images and lab results as part of my medical decision-making.   EKG Interpretation   Date/Time:  Thursday November 09 2014 03:04:04 EDT Ventricular Rate:  149 PR Interval:  88 QRS Duration: 102 QT Interval:  331 QTC Calculation: 521 R Axis:   38 Text Interpretation:  Sinus tachycardia Confirmed by Glynn Octave  [phone]) on 11/09/2014 3:19:41 AM      MDM   Final diagnoses:  None   Patient presents emergency department for sudden onset abdominal pain with nausea and vomiting. Laboratory studies reveal white blood cell count of 22, patient was initially tachycardic to the 150s and a normal blood pressure. She is febrile to 39C. She meet sepsis criteria, code sepsis was called, patient was  given vancomycin and Zosyn for treatment. CT scan of the abdomen and pelvis is only positive for bilateral lower lobe pneumonia. Patient  was given Tylenol and Motrin for her fever.  Her EKG was sinus tachycardia versus atrial flutter with 21 conduction. She was given diltiazem 10 mg for treatment.  Her BP subsequently dropped to 70s/40s and she was given 2L IVF resuscitation.  Her BP is now normal, however she has now become tachycardic and dyspneic, likely from fluid overload.  At this time I signed patient out to Dr. Doy Mince with the understanding that she may require intubation in the ED and admission to the ICU.  She remains in critical condition.  CRITICAL CARE Performed by: Everlene Balls   Total critical care time: 17min - sepsis, respiratory distress  Critical care time was exclusive of separately billable procedures and treating other patients.  Critical care was necessary to treat or prevent imminent or life-threatening deterioration.  Critical care was time spent personally by me on the following activities: development of treatment plan with patient and/or surrogate as well as nursing, discussions with consultants, evaluation of patient's response to treatment, examination of patient, obtaining history from patient or surrogate, ordering and performing treatments and interventions, ordering and review of laboratory studies, ordering and review of radiographic studies, pulse oximetry and re-evaluation of patient's condition.    I, Kyuss Hale, personally performed the services described in this documentation. All medical record entries made by the scribe were at my direction and in my presence.  I have reviewed the chart and discharge instructions and agree that the record reflects my personal performance and is accurate and complete. Sairah Knobloch.  11/09/2014. 6:58 AM.     Everlene Balls, MD 11/09/14 1551

## 2014-11-09 NOTE — Progress Notes (Signed)
ANTIBIOTIC CONSULT NOTE - INITIAL  Pharmacy Consult for Zosyn Indication: Aspiration pneumonia   Allergies  Allergen Reactions  . Codeine Diarrhea and Nausea And Vomiting    REACTION: n/v/d, HA  . Morphine Diarrhea and Nausea And Vomiting    REACTION: n/v/d, HA  . Sulfa Antibiotics Nausea Only    Sick    . Zoledronic Acid Swelling    REACTION: Severe edema    Patient Measurements:   Actual Body Weight: 50.8 kg (on 06/12/14)  Vital Signs: Temp: 101.2 F (38.4 C) (10/13 0604) Temp Source: Rectal (10/13 0604) BP: 128/60 mmHg (10/13 0810) Pulse Rate: 124 (10/13 0810) Intake/Output from previous day: 10/12 0701 - 10/13 0700 In: 2250 [I.V.:2250] Out: -  Intake/Output from this shift:    Labs:  Recent Labs  11/09/14 0324  WBC 22.8*  HGB 14.7  PLT 203  CREATININE 1.02*   CrCl cannot be calculated (Unknown ideal weight.). No results for input(s): VANCOTROUGH, VANCOPEAK, VANCORANDOM, GENTTROUGH, GENTPEAK, GENTRANDOM, TOBRATROUGH, TOBRAPEAK, TOBRARND, AMIKACINPEAK, AMIKACINTROU, AMIKACIN in the last 72 hours.   Microbiology: No results found for this or any previous visit (from the past 720 hour(s)).  Medical History: Past Medical History  Diagnosis Date  . Osteoporosis   . GERD (gastroesophageal reflux disease)   . Hiatal hernia   . PUD (peptic ulcer disease)   . Cardiac arrhythmia   . Hyperlipidemia   . Hypertension   . Scoliosis deformity of spine     history of intractable back pain  . Choledocholithiasis   . Pancolitis (HCC)     Infectious vs. inflammatory  . Abdominal pain     Resolved  . Chronic anemia   . Chronic back pain   . Pancreatitis     History of  . CHF (congestive heart failure) ([place])     Diastolic  on ECHO 7341  . COPD (chronic obstructive pulmonary disease) (HCC)     oxygen dependent at  night 2LPM  . PONV (postoperative nausea and vomiting)   . Reactive airway disease that is not asthma   . HCAP (healthcare-associated pneumonia)  03/28/2014  . Pneumonia 1930's; 2011 X 2; 02/2014  . On home oxygen therapy     "1L during the day; 2L q hs" (03/28/2014)  . Daily headache   . Osteoarthritis     "pretty much q where"   . Recurrent UTI (urinary tract infection)     "here lately" (03/28/2014)    Medications:   (Not in a hospital admission) Assessment: 53 YOF who presented with nausea, vomiting, and abdominal pain. CT scan with contrast revealed sigmoid and rectal wall thickening. WBC elevated at 22.8. Tm 101.2. LA 2.11.   Of note, patient has already received a dose of Vancomycin and Zosyn in the ED.   10/13: BCx2>>  10/13: UCx>>    Goal of Therapy:  Vancomycin trough level 15-20 mcg/ml  Plan:  -Zosyn 3.375 gm IV Q 8 hours  -Monitor CBC, renal fx, cultures and clinical progress  -F/u if vancomycin ordered on admission   Albertina Parr, PharmD., BCPS Clinical Pharmacist Pager [phone]

## 2014-11-09 NOTE — ED Provider Notes (Signed)
Care assumed from Dr. Claudine Mouton.  On my repeat assessment, pt had become dyspneic.  Her BPs trended down to low 100's then 90's. Remained very tachycardic. Labs and imaging reviewed.  I suspect her pneumonia is from aspiration from her vomiting.  Due to her clinical decline and potential for respiratory failure, I consulted critical care.  They evaluated pt in the ED and agreed that she should be admitted to the ICU.  CRITICAL CARE Performed by: Arbie Cookey   Total critical care time: 35 min  Critical care time was exclusive of separately billable procedures and treating other patients.  Critical care was necessary to treat or prevent imminent or life-threatening deterioration.  Critical care was time spent personally by me on the following activities: development of treatment plan with patient and/or surrogate as well as nursing, discussions with consultants, evaluation of patient's response to treatment, examination of patient, obtaining history from patient or surrogate, ordering and performing treatments and interventions, ordering and review of laboratory studies, ordering and review of radiographic studies, pulse oximetry and re-evaluation of patient's condition.  Clinical Impression: 1. Sepsis, due to unspecified organism ([place])   2. Aspiration pneumonia of right lower lobe, unspecified aspiration pneumonia type ([place])   3. Abdominal pain, unspecified abdominal location   4. Colitis   5. Septic shock [facility])       Serita Grit, MD 11/09/14 [phone]

## 2014-11-09 NOTE — Progress Notes (Signed)
Three attempts made to place A-line by 2 RTs, all attempts unsuccessful.  CCM was made aware.

## 2014-11-09 NOTE — Progress Notes (Signed)
eLink Physician-Brief Progress Note Patient Name: Harris Kistler DOB: 1925/02/04 MRN: [id number]   Date of Service  11/09/2014  HPI/Events of Note  Takes percocet at home for ha and requesting  eICU Interventions  Percocet 5-325 one q 4h prn     Intervention Category Minor Interventions: Routine modifications to care plan (e.g. PRN medications for pain, fever)  Christinia Gully 11/09/2014, 9:55 PM

## 2014-11-09 NOTE — Procedures (Signed)
Central Venous Catheter Insertion Procedure Note Keron Koffman [id number] [birth year]  Procedure: Insertion of Central Venous Catheter Indications: Assessment of intravascular volume and Drug and/or fluid administration  Procedure Details Consent: Risks of procedure as well as the alternatives and risks of each were explained to the (patient/caregiver).  Consent for procedure obtained. Time Out: Verified patient identification, verified procedure, site/side was marked, verified correct patient position, special equipment/implants available, medications/allergies/relevent history reviewed, required imaging and test results available.  Performed  Maximum sterile technique was used including antiseptics, cap, gloves, gown, hand hygiene, mask and sheet. Skin prep: Chlorhexidine; local anesthetic administered A antimicrobial bonded/coated triple lumen catheter was placed in the left internal jugular vein using the Seldinger technique.  Evaluation Blood flow good Complications: No apparent complications Patient did tolerate procedure well. Chest X-ray ordered to verify placement.  CXR: pending.   Nickolas Madrid, NP 11/09/2014  9:37 AM

## 2014-11-09 NOTE — Progress Notes (Signed)
Patient is currently NPO but RN can advance diet once patient is more alert per Dr. Lake Bells. Patient will need CVP monitoring, central line, serial lactic acids, and potassium replacement per MD.

## 2014-11-09 NOTE — ED Notes (Signed)
Critical Care MD at bedside.  

## 2014-11-09 NOTE — Progress Notes (Addendum)
[place] Progress Note Patient Name: [name] [name] DOB: [birth year] MRN: [id number]   Date of Service  11/09/2014  HPI/Events of Note  Pm labs with K 6 not hemolyzed but hco3 ok and uop good and ecg nl  eICU Interventions  Repeat bmet     Intervention Category Major Interventions: Electrolyte abnormality - evaluation and management  Christinia Gully 11/09/2014, 6:40 PM

## 2014-11-09 NOTE — H&P (Signed)
PULMONARY / CRITICAL CARE MEDICINE   Name: [name] MRN: [id number] DOB: [birth year]    ADMISSION DATE:  11/09/2014 CONSULTATION DATE:  11/09/2014  REFERRING MD :  EDP  CHIEF COMPLAINT:  Weakness, nausea, vomiting  INITIAL PRESENTATION: 81 y/o female with COPD admitted on 10/13 with a sepsis syndrome likely from colitis and aspiration pneumonia.  STUDIES:  10/13 CT abd/pelv> sigmoid and rectal wall thickening, could represent earlier colitis in a background of diverticulosis without convincing evidence of acute diverticulitis, mildly dilated proximal small bowel without obstruction, small bilateral pleural effusion and RML and RLL pneumonia  SIGNIFICANT EVENTS:   HISTORY OF PRESENT ILLNESS:  This is an 81 y/o female with COPD and diastolic heart failure and recurrent episodes of diverticulitis/colitis who came to [name] on 10/13 with nausea, vomiting, belly pain and weakness.  She was in her usual state of health on 10/12 (very active, lives independently), but after going to the grocery store in the afternoon she suddenly had the onset of nausea, vomiting, and (by report) abdominal pain.  Her granddaughter [name], broadcasting) provides much of the history because [name] is sleepy right now. She continued to have nausea and vomiting all night long so her family brought her to the ED.  In the ED she was noted to be tachycardic, hypotensive, and to have a high lactic acid.  She was given IVF and antibiotics.  PCCM was consulted because she remained hypotensive after 2.5L saline and she became dyspneic.  On my exam this morning she complains of mild dyspnea, no cough, and she denies abdominal pain at any point throughout this illness.    PAST MEDICAL HISTORY :   has a past medical history of Osteoporosis; GERD (gastroesophageal reflux disease); Hiatal hernia; PUD (peptic ulcer disease); Cardiac arrhythmia; Hyperlipidemia; Hypertension; Scoliosis deformity of spine;  Choledocholithiasis; Pancolitis ([name]); Abdominal pain; Chronic anemia; Chronic back pain; Pancreatitis; CHF (congestive heart failure) (HCC); COPD (chronic obstructive pulmonary disease) ([name]); PONV (postoperative nausea and vomiting); Reactive airway disease that is not asthma; HCAP (healthcare-associated pneumonia) (03/28/2014); Pneumonia (1930's; 2011 X 2; 02/2014); On home oxygen therapy; Daily headache; Osteoarthritis; and Recurrent UTI (urinary tract infection).  has past surgical history that includes Cholecystectomy (2008); Abdominal hysterectomy (1970s); Spine surgery (1970's,1995,2007); Cervical laminectomy (1970s); Spinal cord stimulator implant (~ 2009); ERCP w/ sphicterotomy (02/2010); Pain pump implantation (~ 2010); Flexible sigmoidoscopy (N/A, 01/24/2013); Esophagogastroduodenoscopy (N/A, 11/14/2013); Breast cyst excision (Right, 1960's); and Back surgery. Prior to Admission medications   Medication Sig Start Date End Date Taking? Authorizing Provider  Ascorbic Acid (VITAMIN C) 500 MG tablet Take 500 mg by mouth daily.     Yes Historical Provider, MD  aspirin 325 MG tablet Take 1 tablet (325 mg total) by mouth daily. 03/09/14  Yes Robbie Lis, MD  budesonide-formoterol [facility]) 160-4.5 MCG/ACT inhaler Inhale 2 puffs into the lungs 2 (two) times daily. 03/30/14  Yes Donne Hazel, MD  butalbital-acetaminophen-caffeine (FIORICET, ESGIC) [phone] MG per tablet Take 1 tablet by mouth every 8 (eight) hours as needed for headache (headache).    Yes Historical Provider, MD  Calcium Carbonate-Vitamin D (CALCIUM 600-D) 600-400 MG-UNIT per tablet Take 1 tablet by mouth 2 (two) times daily with a meal.     Yes Historical Provider, MD  Clotrimazole 1 % OINT Take 1 application by mouth 2 (two) times daily as needed (mouth sores).  10/31/13  Yes Debbrah Alar, NP  fluticasone (FLONASE) 50 MCG/ACT nasal spray Place 2 sprays into both nostrils daily.  04/19/14  Yes Brunetta Jeans, PA-C  gabapentin  (NEURONTIN) 100 MG capsule Take 1 capsule (100 mg total) by mouth 2 (two) times daily. 08/23/13  Yes Debbrah Alar, NP  HYDROMORPHONE HCL IJ by Intrathecal route continuous.   Yes Historical Provider, MD  omeprazole (PRILOSEC) 40 MG capsule Take 1 capsule (40 mg total) by mouth 2 (two) times daily. 11/16/13  Yes Thurnell Lose, MD  oxyCODONE-acetaminophen (PERCOCET/ROXICET) 5-325 MG per tablet Take 1 tablet by mouth every 6 (six) hours as needed for moderate pain or severe pain (pain). Patient taking differently: Take 1 tablet by mouth 2 (two) times daily.  03/09/14  Yes Robbie Lis, MD  polyethylene glycol [facility] Floria Raveling) packet Take 17 g by mouth daily as needed for mild constipation or moderate constipation. 01/27/13  Yes Donne Hazel, MD  Probiotic Product (PROBIOTIC DAILY PO) Take 1 tablet by mouth daily at 12 noon.   Yes Historical Provider, MD  VITAMIN E PO Take 1 tablet by mouth daily.    Yes Historical Provider, MD  acetaminophen (TYLENOL) 325 MG tablet Take 2 tablets (650 mg total) by mouth every 6 (six) hours as needed for mild pain (or Fever >/= 101). 03/09/14   Robbie Lis, MD  albuterol (PROVENTIL HFA;VENTOLIN HFA) 108 (90 BASE) MCG/ACT inhaler Inhale 1-2 puffs into the lungs 3 (three) times daily as needed for wheezing or shortness of breath (wheezing & shortness of breath).     Historical Provider, MD  guaiFENesin (MUCINEX) 600 MG 12 hr tablet Take 600 mg by mouth daily as needed.    Historical Provider, MD  lactulose (CHRONULAC) 10 GM/15ML solution Take 10 g by mouth daily as needed for mild constipation (constipation). 10 ml by mouth once daily at bedtime 03/30/13   Debbrah Alar, NP  metoprolol (LOPRESSOR) 50 MG tablet Take 1 tablet (50 mg total) by mouth 2 (two) times daily. Patient not taking: Reported on 11/09/2014 04/11/14   Debbrah Alar, NP  nitroGLYCERIN (NITROSTAT) 0.4 MG SL tablet Place 1 tablet (0.4 mg total) under the tongue every 5 (five) minutes as  needed for chest pain. 11/11/10   Darlin Coco, MD  ondansetron (ZOFRAN) 4 MG tablet Take 1 tablet (4 mg total) by mouth every 6 (six) hours as needed for nausea. 03/09/14   Robbie Lis, MD   Allergies  Allergen Reactions  . Codeine Diarrhea and Nausea And Vomiting    REACTION: n/v/d, HA  . Morphine Diarrhea and Nausea And Vomiting    REACTION: n/v/d, HA  . Sulfa Antibiotics Nausea Only    Sick    . Zoledronic Acid Swelling    REACTION: Severe edema    FAMILY HISTORY:  has no family status information on file.  SOCIAL HISTORY:  reports that she quit smoking about 26 years ago. Her smoking use included Cigarettes. She has a 12 pack-year smoking history. She has never used smokeless tobacco. She reports that she does not drink alcohol or use illicit drugs.  REVIEW OF SYSTEMS:   Gen: Denies fever, chills, weight change, fatigue, night sweats HEENT: Denies blurred vision, double vision, hearing loss, tinnitus, sinus congestion, rhinorrhea, sore throat, neck stiffness, dysphagia PULM: per HPI CV: Denies chest pain, edema, orthopnea, paroxysmal nocturnal dyspnea, palpitations GI: per HPI GU: Denies dysuria, hematuria, polyuria, oliguria, urethral discharge Endocrine: Denies hot or cold intolerance, polyuria, polyphagia or appetite change Derm: Denies rash, dry skin, scaling or peeling skin change Heme: Denies easy bruising, bleeding, bleeding gums Neuro: Denies  headache, numbness, weakness, slurred speech, loss of memory or consciousness   SUBJECTIVE:   VITAL SIGNS: Temp:  [99.1 F (37.3 C)-102.2 F (39 C)] 101.2 F (38.4 C) (10/13 0604) Pulse Rate:  [108-153] 138 (10/13 0800) Resp:  [13-26] 20 (10/13 0800) BP: (73-163)/(44-84) 77/50 mmHg (10/13 0800) SpO2:  [90 %-98 %] 97 % (10/13 0800) HEMODYNAMICS:   VENTILATOR SETTINGS:   INTAKE / OUTPUT:  Intake/Output Summary (Last 24 hours) at 11/09/14 0737 Last data filed at 11/09/14 0656  Gross per 24 hour  Intake   2250  ml  Output      0 ml  Net   2250 ml    PHYSICAL EXAMINATION: General:  Resting comfortably in bed Neuro:  Sonorous but arousable, answers questions, moves all four ext HEENT:  NCAT, EOMi, OP clear Cardiovascular:  Tachy, regular, no mgr, skin well perfused Lungs:  Crackles in bases, some wheezing in bases, normal effort Abdomen:  Minimal bowel sounds, non-tender all over Musculoskeletal:  Normal bulk, tone Skin:  No rash or skin break down  LABS:  CBC  Recent Labs Lab 11/09/14 0324  WBC 22.8*  HGB 14.7  HCT 45.3  PLT 203   Coag's No results for input(s): APTT, INR in the last 168 hours. BMET  Recent Labs Lab 11/09/14 0324  NA 138  K 3.3*  CL 98*  CO2 28  BUN 17  CREATININE 1.02*  GLUCOSE 168*   Electrolytes  Recent Labs Lab 11/09/14 0324  CALCIUM 8.8*   Sepsis Markers  Recent Labs Lab 11/09/14 0433 11/09/14 0642  LATICACIDVEN 3.16* 2.11*   ABG No results for input(s): PHART, PCO2ART, PO2ART in the last 168 hours. Liver Enzymes  Recent Labs Lab 11/09/14 0324  AST 46*  ALT 24  ALKPHOS 95  BILITOT 0.4  ALBUMIN 3.4*   Cardiac Enzymes No results for input(s): TROPONINI, PROBNP in the last 168 hours. Glucose No results for input(s): GLUCAP in the last 168 hours.  Imaging  CT abdomen images and CXR images personally reviewed showing an infiltrate in the right lung   ASSESSMENT / PLAN:  PULMONARY A:  Aspiration pneumonia COPD, not in exacerbation, baseline 1L daily, 2L qHS Respiratory distress but no signs of respiratory failure due to aspiration pneumonia and pulmonary edema P:   Change symbicort to brovana and pulmicort nebulized with as needed albuterol O2 as needed to maintain O2 saturation > 92% Close monitoring in ICU as at risk for respiratory failure See ID  CARDIOVASCULAR CVL pending A: Septic shock> due to colitis and aspiration pneumonia (currently volume overloaded after volume resuscitation) Sinus tachycardia due to  sepsis CHF> currently appears volume overloaded P:  Place CVL Measure CVP Follow lactic acid Use neosynephrine for shock Hold home metoprolol for now  RENAL A:  Mild AKI (baseline Cr 0.6, no UOP here) Hypokalemia P:   Replace KCL Monitor UOP closely Hold off on foley if able Repeat BMET later today  GASTROINTESTINAL A:  Nausea and vomiting with ?abdominal pain> presumably due to colitis/early diverticulitis again vs viral gastroenteritis Constipation related to chronic narcotics? P:   NPO given aspiration Prn zofran IV antibiotics as below Advance diet if she is feeling better Consider [facility] consult ([facility]) if she does not improve today Continue home lactulose when taking PO  HEMATOLOGIC A:  No acute issues P:  Monitor for bleeding  INFECTIOUS A:  Septic shock due to colitis/diverticulitis and aspiration pneumonia Sepsis - Repeat Assessment  Performed at:    1062  Vitals     Blood pressure 98/63, pulse 120, temperature 101.2 F (38.4 C), temperature source Rectal, resp. rate 14, SpO2 98 %.  Heart:     Tachycardic  Lungs:    Rales and Wheezing  Capillary Refill:   <2 sec  Peripheral Pulse:   Radial pulse palpable  Skin:     Normal Color   P:   BCx2 10/13 >  UC 10/13   Zosyn 10/13 >   ENDOCRINE A:  Hyperglycemia, no history of diabetes P:   Monitor glucose  NEUROLOGIC A:  Mildly encephalopathic, presumably due to phenergan received in ER Chronic pain on chronic dilaudid intrathecal P:   Hold home narcotics with exception of intrathecal dilaudid pump Hold sedating meds as able Maintain low dose gabapentin (home dose)   FAMILY  - Updates: Granddaughter updated bedside; she states that Ms. Goeden is full code but they have really never had a full conversation about this.  - Inter-disciplinary family meet or Palliative Care meeting due by:  10/20   My cc time 50 minutes    Roselie Awkward, MD [facility] Pager: [phone] Cell:  [phone] After 3pm or if no response, call [phone]   11/09/2014, 8:08 AM

## 2014-11-09 NOTE — ED Notes (Signed)
Patient taken to CT.

## 2014-11-10 DIAGNOSIS — N390 Urinary tract infection, site not specified: Secondary | ICD-10-CM

## 2014-11-10 DIAGNOSIS — K5732 Diverticulitis of large intestine without perforation or abscess without bleeding: Secondary | ICD-10-CM

## 2014-11-10 LAB — BASIC METABOLIC PANEL
Anion gap: 7 (ref 5–15)
BUN: 20 mg/dL (ref 6–20)
CO2: 27 mmol/L (ref 22–32)
Calcium: 8 mg/dL — ABNORMAL LOW (ref 8.9–10.3)
Chloride: 103 mmol/L (ref 101–111)
Creatinine, Ser: 0.94 mg/dL (ref 0.44–1.00)
GFR calc Af Amer: >60 mL/min (ref >60)
GFR calc non Af Amer: 53 mL/min — ABNORMAL LOW (ref >60)
Glucose, Bld: 123 mg/dL — ABNORMAL HIGH (ref 65–99)
Potassium: 5.1 mmol/L (ref 3.5–5.1)
Sodium: 137 mmol/L (ref 135–145)

## 2014-11-10 LAB — CBC
HCT: 38 % (ref 36.0–46.0)
Hemoglobin: 12.1 g/dL (ref 12.0–15.0)
MCH: 30.6 pg (ref 26.0–34.0)
MCHC: 31.8 g/dL (ref 30.0–36.0)
MCV: 96.2 fL (ref 78.0–100.0)
Platelets: 147 10*3/uL — ABNORMAL LOW (ref 150–400)
RBC: 3.95 MIL/uL (ref 3.87–5.11)
RDW: 14.1 % (ref 11.5–15.5)
WBC: 11.9 10*3/uL — ABNORMAL HIGH (ref 4.0–10.5)

## 2014-11-10 LAB — GLUCOSE, CAPILLARY: Glucose-Capillary: 144 mg/dL — ABNORMAL HIGH (ref 65–99)

## 2014-11-10 NOTE — Progress Notes (Signed)
PULMONARY / CRITICAL CARE MEDICINE   Name: [name] MRN: [id number] DOB: 1925/02/14    ADMISSION DATE:  11/09/2014 CONSULTATION DATE:  11/09/2014  REFERRING MD :  EDP  CHIEF COMPLAINT:  Weakness, nausea, vomiting  INITIAL PRESENTATION: 81 y/o female with COPD admitted on 10/13 with a sepsis syndrome likely from colitis and aspiration pneumonia.  STUDIES:  10/13 CT abd/pelv> sigmoid and rectal wall thickening, could represent earlier colitis in a background of diverticulosis without convincing evidence of acute diverticulitis, mildly dilated proximal small bowel without obstruction, small bilateral pleural effusion and RML and RLL pneumonia  SIGNIFICANT EVENTS:   HISTORY OF PRESENT ILLNESS:  This is an 81 y/o female with COPD and diastolic heart failure and recurrent episodes of diverticulitis/colitis who came to the [facility] ED on 10/13 with nausea, vomiting, belly pain and weakness.  She was in her usual state of health on 10/12 (very active, lives independently), but after going to the grocery store in the afternoon she suddenly had the onset of nausea, vomiting, and (by report) abdominal pain.  Her granddaughter [name], broadcasting) provides much of the history because [name] is sleepy right now. She continued to have nausea and vomiting all night long so her family brought her to the ED.  In the ED she was noted to be tachycardic, hypotensive, and to have a high lactic acid.  She was given IVF and antibiotics.  PCCM was consulted because she remained hypotensive after 2.5L saline and she became dyspneic.  On my exam this morning she complains of mild dyspnea, no cough, and she denies abdominal pain at any point throughout this illness.     SUBJECTIVE:  Denies abd pain Afebrile On 100 mcg neo  Good UO  VITAL SIGNS: Temp:  [97.4 F (36.3 C)-98.6 F (37 C)] 98.4 F (36.9 C) (10/14 1100) Pulse Rate:  [96-127] 102 (10/14 1100) Resp:  [8-25] 11 (10/14 1100) BP:  (84-121)/(50-101) 105/63 mmHg (10/14 1100) SpO2:  [94 %-100 %] 100 % (10/14 1100) Weight:  [49.941 kg (110 lb 1.6 oz)-52 kg (114 lb 10.2 oz)] 52 kg (114 lb 10.2 oz) (10/14 0500) HEMODYNAMICS: CVP:  [8 mmHg-15 mmHg] 9 mmHg VENTILATOR SETTINGS:   INTAKE / OUTPUT:  Intake/Output Summary (Last 24 hours) at 11/10/14 1314 Last data filed at 11/10/14 1100  Gross per 24 hour  Intake 1656.76 ml  Output   2022 ml  Net -365.24 ml    PHYSICAL EXAMINATION: General:  Resting comfortably in bed Neuro: Alert, interactive, moves all four ext HEENT:  NCAT, EOMi, OP clear Cardiovascular:  Tachy, regular, no mgr, skin well perfused Lungs:  Crackles in bases, some wheezing in bases, normal effort Abdomen:  Minimal bowel sounds, non-tender all over Musculoskeletal:  Normal bulk, tone Skin:  No rash or skin break down  LABS:  CBC  Recent Labs Lab 11/09/14 0324 11/09/14 0823 11/10/14 0228  WBC 22.8* 19.1* 11.9*  HGB 14.7 14.4 12.1  HCT 45.3 45.1 38.0  PLT 203 181 147*   Coag's No results for input(s): APTT, INR in the last 168 hours. BMET  Recent Labs Lab 11/09/14 1527 11/09/14 1900 11/10/14 0228  NA 135 133* 137  K 6.0* 4.5 5.1  CL 105 101 103  CO2 25 25 27   BUN 23* 23* 20  CREATININE 1.12* 1.00 0.94  GLUCOSE 144* 152* 123*   Electrolytes  Recent Labs Lab 11/09/14 1527 11/09/14 1900 11/10/14 0228  CALCIUM 7.5* 7.4* 8.0*   Sepsis Markers  Recent Labs Lab  11/09/14 0433 11/09/14 0642 11/09/14 0823  LATICACIDVEN 3.16* 2.11* 2.7*  PROCALCITON  --   --  50.08   ABG  Recent Labs Lab 11/09/14 1133  PHART 7.299*  PCO2ART 48.8*  PO2ART 145.0*   Liver Enzymes  Recent Labs Lab 11/09/14 0324  AST 46*  ALT 24  ALKPHOS 95  BILITOT 0.4  ALBUMIN 3.4*   Cardiac Enzymes No results for input(s): TROPONINI, PROBNP in the last 168 hours. Glucose  Recent Labs Lab 11/09/14 0848 11/09/14 1201 11/10/14 1121  GLUCAP 143* 160* 144*    Imaging  CT abdomen  images and CXR images personally reviewed showing an infiltrate in the right lung   ASSESSMENT / PLAN:  PULMONARY A:  Aspiration pneumonia COPD, not in exacerbation, baseline 1L daily, 2L qHS Acute Respiratory distress but no signs of respiratory failure due to aspiration pneumonia and pulmonary edema P:   Change symbicort to brovana and pulmicort nebulized with as needed albuterol O2 as needed to maintain O2 saturation > 92%   CARDIOVASCULAR CVL pending A: Septic shock> due to colitis and aspiration pneumonia (currently volume overloaded after volume resuscitation) Sinus tachycardia due to sepsis CHF> currently appears volume overloaded P:  CVP goal 8-10 range Ct neosynephrine gtt since tachy with levo  Hold home metoprolol for now  RENAL A:  Mild AKI (baseline Cr 0.6) - resolving Hypokalemia P:   Replace lytes as needed Monitor UOP    GASTROINTESTINAL A:  Nausea and vomiting with ?abdominal pain> presumably due to colitis/early diverticulitis again vs viral gastroenteritis Constipation related to chronic narcotics? P:   Liquid diet Prn zofran Consider [facility] consult ([facility]) if she does not improve  Continue home lactulose when taking PO  HEMATOLOGIC A:  No acute issues P:  Monitor for bleeding  INFECTIOUS A:  Septic shock due to colitis/diverticulitis and aspiration pneumonia Sepsis - Repeat Assessment   P:   BCx2 10/13 >  UC 10/13 >. E coli >>  Zosyn 10/13 >   ENDOCRINE A:  Hyperglycemia, no history of diabetes P:   Monitor glucose  NEUROLOGIC A:  Mildly encephalopathic, presumably due to phenergan received in ER Chronic pain on chronic dilaudid intrathecal P:   Hold home narcotics , ct intrathecal dilaudid pump Maintain low dose gabapentin (home dose)   FAMILY  - Updates: Granddaughter updated bedside; she states that [name] is full code but they have really never had a full conversation about this.  - Inter-disciplinary family meet  or Palliative Care meeting due by:  10/20  Summary - septic shock, UTI versus diverticulitis-decreasing pressor requirements, appears euvolemic and with good perfusion, will accept systolic blood pressure 90 and above  The patient is critically ill with multiple organ systems failure and requires high complexity decision making for assessment and support, frequent evaluation and titration of therapies, application of advanced monitoring technologies and extensive interpretation of multiple databases. Critical Care Time devoted to patient care services described in this note independent of APP time is 32 minutes.   Rigoberto Noel MD  11/10/2014, 1:14 PM

## 2014-11-11 ENCOUNTER — Inpatient Hospital Stay (HOSPITAL_COMMUNITY): Payer: Medicare Other

## 2014-11-11 DIAGNOSIS — K5733 Diverticulitis of large intestine without perforation or abscess with bleeding: Secondary | ICD-10-CM

## 2014-11-11 LAB — BASIC METABOLIC PANEL
Anion gap: 7 (ref 5–15)
BUN: 11 mg/dL (ref 6–20)
CO2: 28 mmol/L (ref 22–32)
Calcium: 8 mg/dL — ABNORMAL LOW (ref 8.9–10.3)
Chloride: 98 mmol/L — ABNORMAL LOW (ref 101–111)
Creatinine, Ser: 0.73 mg/dL (ref 0.44–1.00)
GFR calc Af Amer: >60 mL/min (ref >60)
GFR calc non Af Amer: >60 mL/min (ref >60)
Glucose, Bld: 106 mg/dL — ABNORMAL HIGH (ref 65–99)
Potassium: 3.9 mmol/L (ref 3.5–5.1)
Sodium: 133 mmol/L — ABNORMAL LOW (ref 135–145)

## 2014-11-11 LAB — URINE CULTURE: Culture: >=100000

## 2014-11-11 LAB — CBC
HCT: 33.4 % — ABNORMAL LOW (ref 36.0–46.0)
Hemoglobin: 10.9 g/dL — ABNORMAL LOW (ref 12.0–15.0)
MCH: 31 pg (ref 26.0–34.0)
MCHC: 32.6 g/dL (ref 30.0–36.0)
MCV: 94.9 fL (ref 78.0–100.0)
Platelets: 117 10*3/uL — ABNORMAL LOW (ref 150–400)
RBC: 3.52 MIL/uL — ABNORMAL LOW (ref 3.87–5.11)
RDW: 14.2 % (ref 11.5–15.5)
WBC: 6.8 10*3/uL (ref 4.0–10.5)

## 2014-11-11 LAB — GLUCOSE, CAPILLARY: Glucose-Capillary: 105 mg/dL — ABNORMAL HIGH (ref 65–99)

## 2014-11-11 MED ORDER — ACETAMINOPHEN 325 MG PO TABS
650.0000 mg | ORAL_TABLET | Freq: Four times a day (QID) | ORAL | Status: DC | PRN
  Administered 2014-11-11 – 2014-11-15 (×7): 650 mg via ORAL
  Filled 2014-11-11 (×7): qty 2

## 2014-11-11 NOTE — Progress Notes (Signed)
PULMONARY / CRITICAL CARE MEDICINE   Name: [name] [name] MRN: [id number] DOB: May 10, 1925    ADMISSION DATE:  11/09/2014 CONSULTATION DATE:  11/09/2014  REFERRING MD :  EDP  CHIEF COMPLAINT:  Weakness, nausea, vomiting  INITIAL PRESENTATION: 81 y/o female with COPD admitted on 10/13 with a sepsis syndrome likely from colitis and aspiration pneumonia.  STUDIES:  10/13 CT abd/pelv> sigmoid and rectal wall thickening, could represent earlier colitis in a background of diverticulosis without convincing evidence of acute diverticulitis, mildly dilated proximal small bowel without obstruction, small bilateral pleural effusion and RML and RLL pneumonia  SIGNIFICANT EVENTS:   HISTORY OF PRESENT ILLNESS:  This is an 81 y/o female with COPD and diastolic heart failure and recurrent episodes of diverticulitis/colitis who came to the [facility] ED on 10/13 with nausea, vomiting, belly pain and weakness.  She was in her usual state of health on 10/12 (very active, lives independently), but after going to the grocery store in the afternoon she suddenly had the onset of nausea, vomiting, and (by report) abdominal pain.  Her granddaughter [name] [name]) provides much of the history because [name] [name] is sleepy right now. She continued to have nausea and vomiting all night long so her family brought her to the ED.  In the ED she was noted to be tachycardic, hypotensive, and to have a high lactic acid.  She was given IVF and antibiotics.  PCCM was consulted because she remained hypotensive after 2.5L saline and she became dyspneic.  On my exam this morning she complains of mild dyspnea, no cough, and she denies abdominal pain at any point throughout this illness.     SUBJECTIVE:  Denies abd pain Afebrile On 30 mcg neo  Good UO  VITAL SIGNS: Temp:  [97.8 F (36.6 C)-98.5 F (36.9 C)] 97.8 F (36.6 C) (10/15 0328) Pulse Rate:  [65-109] 101 (10/15 0815) Resp:  [10-24] 12 (10/15 0815) BP:  (83-128)/(32-109) 117/56 mmHg (10/15 0815) SpO2:  [86 %-100 %] 97 % (10/15 0815) Weight:  [52.5 kg (115 lb 11.9 oz)] 52.5 kg (115 lb 11.9 oz) (10/15 0400) HEMODYNAMICS: CVP:  [7 mmHg-19 mmHg] 11 mmHg VENTILATOR SETTINGS:   INTAKE / OUTPUT:  Intake/Output Summary (Last 24 hours) at 11/11/14 0942 Last data filed at 11/11/14 0800  Gross per 24 hour  Intake 1129.04 ml  Output   1760 ml  Net -630.96 ml    PHYSICAL EXAMINATION: General:  Resting comfortably in bed Neuro: Alert, interactive, moves all four ext HEENT:  NCAT, EOMi, OP clear Cardiovascular:  Tachy, regular, no mgr, skin well perfused Lungs:  some wheezing in bases, normal effort Abdomen:  Minimal bowel sounds, non-tender all over Musculoskeletal:  Normal bulk, tone Skin:  No rash or skin break down  LABS:  CBC  Recent Labs Lab 11/09/14 0823 11/10/14 0228 11/11/14 0422  WBC 19.1* 11.9* 6.8  HGB 14.4 12.1 10.9*  HCT 45.1 38.0 33.4*  PLT 181 147* 117*   Coag's No results for input(s): APTT, INR in the last 168 hours. BMET  Recent Labs Lab 11/09/14 1900 11/10/14 0228 11/11/14 0422  NA 133* 137 133*  K 4.5 5.1 3.9  CL 101 103 98*  CO2 25 27 28   BUN 23* 20 11  CREATININE 1.00 0.94 0.73  GLUCOSE 152* 123* 106*   Electrolytes  Recent Labs Lab 11/09/14 1900 11/10/14 0228 11/11/14 0422  CALCIUM 7.4* 8.0* 8.0*   Sepsis Markers  Recent Labs Lab 11/09/14 0433 11/09/14 0642 11/09/14 0823  LATICACIDVEN  3.16* 2.11* 2.7*  PROCALCITON  --   --  50.08   ABG  Recent Labs Lab 11/09/14 1133  PHART 7.299*  PCO2ART 48.8*  PO2ART 145.0*   Liver Enzymes  Recent Labs Lab 11/09/14 0324  AST 46*  ALT 24  ALKPHOS 95  BILITOT 0.4  ALBUMIN 3.4*   Cardiac Enzymes No results for input(s): TROPONINI, PROBNP in the last 168 hours. Glucose  Recent Labs Lab 11/09/14 0848 11/09/14 1201 11/10/14 1121  GLUCAP 143* 160* 144*    Imaging  CT abdomen images and CXR images personally reviewed  showing an infiltrate in the right lung   ASSESSMENT / PLAN:  PULMONARY A:  Aspiration pneumonia COPD, not in exacerbation, baseline 1L daily, 2L qHS Acute Respiratory distress but no signs of respiratory failure due to aspiration pneumonia and pulmonary edema P:   ct brovana and pulmicort nebulized with as needed albuterol-back to symbicort as outpt O2 as needed to maintain O2 saturation > 92%   CARDIOVASCULAR CVL pending A: Septic shock> due to colitis and aspiration pneumonia (currently volume overloaded after volume resuscitation) Sinus tachycardia due to sepsis CHF> currently appears volume overloaded P:  CVP goal 8-10 range Ct neosynephrine gtt since tachy with levo  Hold home metoprolol   RENAL A:  Mild AKI (baseline Cr 0.6) - resolving Hypokalemia P:   Replace lytes as needed Monitor UOP    GASTROINTESTINAL A:  Nausea and vomiting with ?abdominal pain> presumably due to colitis/early diverticulitis again vs viral gastroenteritis Constipation related to chronic narcotics? P:   Liquid diet Prn zofran Consider [facility] consult ([name] [name]) if she does not improve  Continue home lactulose when taking PO  HEMATOLOGIC A:  No acute issues P:  Monitor for bleeding  INFECTIOUS A:  Septic shock due to colitis/diverticulitis and aspiration pneumonia Sepsis - Repeat Assessment   P:   BCx2 10/13 >  UC 10/13 >. E coli >>  Zosyn 10/13 >  Await sens to simplify  ENDOCRINE A:  Hyperglycemia, no history of diabetes P:   Monitor glucose  NEUROLOGIC A:  Mildly encephalopathic, presumably due to phenergan received in ER Chronic pain on chronic dilaudid intrathecal P:   Hold home narcotics , ct intrathecal dilaudid pump Maintain low dose gabapentin (home dose) tylenol   FAMILY  - Updates: Granddaughter updated bedside 10/12; she states that [name] [name] is full code but they have really never had a full conversation about this.  - Inter-disciplinary family meet  or Palliative Care meeting due by:  10/20  Summary - septic shock, UTI versus diverticulitis-decreasing pressor requirements, appears euvolemic and with good perfusion, will accept systolic blood pressure 90 and above, hope to come off pressors today  The patient is critically ill with multiple organ systems failure and requires high complexity decision making for assessment and support, frequent evaluation and titration of therapies, application of advanced monitoring technologies and extensive interpretation of multiple databases. Critical Care Time devoted to patient care services described in this note independent of APP time is 32 minutes.   Rigoberto Noel MD  11/11/2014, 9:42 AM

## 2014-11-11 NOTE — Progress Notes (Signed)
Urinary Catheter removed per nurse protocol.  Will continue to monitor patient to void by 0800 am 11/12/2014. Gildardo Cranker, RN 11/11/2014 11:20 PM

## 2014-11-12 DIAGNOSIS — A4151 Sepsis due to Escherichia coli [E. coli]: Secondary | ICD-10-CM

## 2014-11-12 LAB — CBC
HCT: 35 % — ABNORMAL LOW (ref 36.0–46.0)
Hemoglobin: 11.1 g/dL — ABNORMAL LOW (ref 12.0–15.0)
MCH: 30.4 pg (ref 26.0–34.0)
MCHC: 31.7 g/dL (ref 30.0–36.0)
MCV: 95.9 fL (ref 78.0–100.0)
Platelets: 115 10*3/uL — ABNORMAL LOW (ref 150–400)
RBC: 3.65 MIL/uL — ABNORMAL LOW (ref 3.87–5.11)
RDW: 14 % (ref 11.5–15.5)
WBC: 4.7 10*3/uL (ref 4.0–10.5)

## 2014-11-12 LAB — BASIC METABOLIC PANEL WITH GFR
Anion gap: 11 (ref 5–15)
BUN: 8 mg/dL (ref 6–20)
CO2: 28 mmol/L (ref 22–32)
Calcium: 8.7 mg/dL — ABNORMAL LOW (ref 8.9–10.3)
Chloride: 103 mmol/L (ref 101–111)
Creatinine, Ser: 0.78 mg/dL (ref 0.44–1.00)
GFR calc Af Amer: >60 mL/min
GFR calc non Af Amer: >60 mL/min
Glucose, Bld: 111 mg/dL — ABNORMAL HIGH (ref 65–99)
Potassium: 4.1 mmol/L (ref 3.5–5.1)
Sodium: 142 mmol/L (ref 135–145)

## 2014-11-12 LAB — MAGNESIUM: Magnesium: 1.9 mg/dL (ref 1.7–2.4)

## 2014-11-12 LAB — PHOSPHORUS: Phosphorus: 3.3 mg/dL (ref 2.5–4.6)

## 2014-11-12 MED ORDER — METOPROLOL TARTRATE 12.5 MG HALF TABLET
12.5000 mg | ORAL_TABLET | Freq: Two times a day (BID) | ORAL | Status: DC
  Administered 2014-11-12 – 2014-11-18 (×12): 12.5 mg via ORAL
  Filled 2014-11-12 (×12): qty 1

## 2014-11-12 MED ORDER — DEXTROSE 5 % IV SOLN
1.0000 g | INTRAVENOUS | Status: DC
  Administered 2014-11-12 – 2014-11-18 (×7): 1 g via INTRAVENOUS
  Filled 2014-11-12 (×8): qty 10

## 2014-11-12 NOTE — Progress Notes (Signed)
PULMONARY / CRITICAL CARE MEDICINE   Name: [name] [name] MRN: [id number] DOB: 01/10/1926    ADMISSION DATE:  11/09/2014 CONSULTATION DATE:  11/09/2014  REFERRING MD :  EDP  CHIEF COMPLAINT:  Weakness, nausea, vomiting  INITIAL PRESENTATION: 81 y/o female with COPD admitted on 10/13 with a sepsis syndrome likely from colitis and aspiration pneumonia.  STUDIES:  10/13 CT abd/pelv> sigmoid and rectal wall thickening, could represent earlier colitis in a background of diverticulosis without convincing evidence of acute diverticulitis, mildly dilated proximal small bowel without obstruction, small bilateral pleural effusion and RML and RLL pneumonia  SIGNIFICANT EVENTS:   HISTORY OF PRESENT ILLNESS:  This is an 81 y/o female with COPD and diastolic heart failure and recurrent episodes of diverticulitis/colitis who came to the [facility] ED on 10/13 with nausea, vomiting, belly pain and weakness.  She was in her usual state of health on 10/12 (very active, lives independently), but after going to the grocery store in the afternoon she suddenly had the onset of nausea, vomiting, and (by report) abdominal pain.  Her granddaughter [name], broadcasting) provides much of the history because [name] [name] is sleepy right now. She continued to have nausea and vomiting all night long so her family brought her to the ED.  In the ED she was noted to be tachycardic, hypotensive, and to have a high lactic acid.  She was given IVF and antibiotics.  PCCM was consulted because she remained hypotensive after 2.5L saline and she became dyspneic.  On my exam this morning she complains of mild dyspnea, no cough, and she denies abdominal pain at any point throughout this illness.     SUBJECTIVE:  C/o abd pain, feels 'sick to stomach' Afebrile Off neo    VITAL SIGNS: Temp:  [97.9 F (36.6 C)-99.3 F (37.4 C)] 97.9 F (36.6 C) (10/16 0804) Pulse Rate:  [74-101] 80 (10/16 0700) Resp:  [9-24] 10 (10/16 0700) BP:  (79-116)/(30-75) 98/71 mmHg (10/16 0700) SpO2:  [87 %-100 %] 98 % (10/16 0700) Weight:  [52.5 kg (115 lb 11.9 oz)-52.6 kg (115 lb 15.4 oz)] 52.6 kg (115 lb 15.4 oz) (10/16 0400) HEMODYNAMICS: CVP:  [8 mmHg-18 mmHg] 10 mmHg VENTILATOR SETTINGS:   INTAKE / OUTPUT:  Intake/Output Summary (Last 24 hours) at 11/12/14 0852 Last data filed at 11/12/14 0700  Gross per 24 hour  Intake  388.4 ml  Output    745 ml  Net -356.6 ml    PHYSICAL EXAMINATION: General:  Resting comfortably in bed Neuro: Alert, interactive, moves all four ext HEENT:  NCAT, EOMi, OP clear Cardiovascular:  Tachy, regular, no mgr, skin well perfused Lungs:  some wheezing in bases, normal effort Abdomen:  Minimal bowel sounds, non-tender all over Musculoskeletal:  Normal bulk, tone Skin:  No rash or skin break down  LABS:  CBC  Recent Labs Lab 11/10/14 0228 11/11/14 0422 11/12/14 0354  WBC 11.9* 6.8 4.7  HGB 12.1 10.9* 11.1*  HCT 38.0 33.4* 35.0*  PLT 147* 117* 115*   Coag's No results for input(s): APTT, INR in the last 168 hours. BMET  Recent Labs Lab 11/10/14 0228 11/11/14 0422 11/12/14 0354  NA 137 133* 142  K 5.1 3.9 4.1  CL 103 98* 103  CO2 27 28 28   BUN 20 11 8   CREATININE 0.94 0.73 0.78  GLUCOSE 123* 106* 111*   Electrolytes  Recent Labs Lab 11/10/14 0228 11/11/14 0422 11/12/14 0354  CALCIUM 8.0* 8.0* 8.7*  MG  --   --  1.9  PHOS  --   --  3.3   Sepsis Markers  Recent Labs Lab 11/09/14 0433 11/09/14 0642 11/09/14 0823  LATICACIDVEN 3.16* 2.11* 2.7*  PROCALCITON  --   --  50.08   ABG  Recent Labs Lab 11/09/14 1133  PHART 7.299*  PCO2ART 48.8*  PO2ART 145.0*   Liver Enzymes  Recent Labs Lab 11/09/14 0324  AST 46*  ALT 24  ALKPHOS 95  BILITOT 0.4  ALBUMIN 3.4*   Cardiac Enzymes No results for input(s): TROPONINI, PROBNP in the last 168 hours. Glucose  Recent Labs Lab 11/09/14 0848 11/09/14 1201 11/10/14 1121 11/11/14 1621  GLUCAP 143* 160*  144* 105*    Imaging  CT abdomen images and CXR images personally reviewed showing bibasal infx vs atx   ASSESSMENT / PLAN:  PULMONARY A:  Aspiration pneumonia COPD, not in exacerbation, baseline 1L daily, 2L qHS Acute Respiratory distress but no signs of respiratory failure due to aspiration pneumonia and pulmonary edema P:   ct brovana and pulmicort nebulized with as needed albuterol-back to symbicort as outpt O2 as needed to maintain O2 saturation > 92%   CARDIOVASCULAR CVL pending A: Septic shock> due to colitis and aspiration pneumonia (currently volume overloaded after volume resuscitation) Sinus tachycardia due to sepsis CHF> currently appears volume overloaded P:  CVP dc Off neosynephrine gtt  resume home metoprolol   RENAL A:  Mild AKI (baseline Cr 0.6) - resolving Hypokalemia P:   Replace lytes as needed Monitor UOP    GASTROINTESTINAL A:  Nausea and vomiting with ?abdominal pain> presumably due to colitis/early diverticulitis again vs viral gastroenteritis Constipation related to chronic narcotics? P:   Advance diet Prn zofran Consider [facility] consult ([place]) if she does not improve  Continue home lactulose when taking PO  HEMATOLOGIC A:  Thrombocytopenia P:  Dc SQ heaprin  INFECTIOUS A:  Septic shock due to colitis/diverticulitis and aspiration pneumonia Sepsis - Repeat Assessment   P:   BCx2 10/13 >  UC 10/13 >. E coli >>panS  Zosyn 10/13 > 10/16 Ceftriaxone 10/16 >>  ENDOCRINE A:  Hyperglycemia, no history of diabetes P:   Monitor glucose  NEUROLOGIC A:  Mildly encephalopathic, presumably due to phenergan received in ER Chronic pain on chronic dilaudid intrathecal P:   Hold home narcotics , ct intrathecal dilaudid pump Maintain low dose gabapentin (home dose) tylenol   FAMILY  - Updates: Granddaughter updated bedside 10/12; she states that [name] [name] is full code but they have really never had a full conversation about  this.  - Inter-disciplinary family meet or Palliative Care meeting due by:  10/20  Summary - septic shock, UTI versus diverticulitis-Off pressors  Can transfer to tele & triad 10/17   Kara Mead MD. [facility]. [place] [facility] Pager [phone] If no response call [place] MD  11/12/2014, 8:52 AM

## 2014-11-12 NOTE — Evaluation (Signed)
Physical Therapy Evaluation Patient Details Name: [name] [name] MRN: [id number] DOB: Nov 05, 1925 Today's Date: 11/12/2014   History of Present Illness  81 y/o female with COPD admitted on 10/13 with a sepsis syndrome likely from colitis and aspiration pneumonia  Clinical Impression  Patient demonstrates deficits in functional mobility as indicated below. Will need continued skilled PT to address deficits and maximize function. Will see as indicated and progress as tolerated. OF NOTE: Patient with + orthostatic drop in BP with activity    Follow Up Recommendations SNF;Supervision/Assistance - 24 hour (if pt can arrange 24/7 assist may consider HHPT)    Equipment Recommendations  None recommended by PT    Recommendations for Other Services       Precautions / Restrictions Precautions Precautions: Fall Restrictions Weight Bearing Restrictions: No      Mobility  Bed Mobility Overal bed mobility: Needs Assistance Bed Mobility: Supine to Sit     Supine to sit: Min assist;HOB elevated     General bed mobility comments: min assist to come to EOB, assist to pull to sitting, otherwise min guard  Transfers Overall transfer level: Needs assistance Equipment used: 1 person hand held assist Transfers: Sit to/from Stand Sit to Stand: Min assist         General transfer comment: Min assist for stability upon coming to standing  Ambulation/Gait Ambulation/Gait assistance: Min assist Ambulation Distance (Feet): 40 Feet Assistive device: 1 person hand held assist (pushing IV pole with other UE) Gait Pattern/deviations: Step-through pattern;Decreased stride length;Shuffle;Trunk flexed;Narrow base of support Gait velocity: decreased Gait velocity interpretation: Below normal speed for age/gender General Gait Details: slow and guarded with ambulation, reported some overall "swimmy headed" feeling. BP assessed upon return to room, soft 80s/50 but improved to 110s/60s with seated rest  break   Stairs            Wheelchair Mobility    Modified Rankin (Stroke Patients Only)       Balance Overall balance assessment: Needs assistance Sitting-balance support: Feet supported Sitting balance-Leahy Scale: Good     Standing balance support: During functional activity Standing balance-Leahy Scale: Poor Standing balance comment: required UE support to static stand                             Pertinent Vitals/Pain Pain Assessment: No/denies pain    Home Living Family/patient expects to be discharged to:: Private residence Living Arrangements: Alone Available Help at Discharge: Family;Available PRN/intermittently Type of Home: House Home Access: Ramped entrance     Home Layout: One level Home Equipment: Walker - 2 wheels;Cane - single point;Bedside commode Additional Comments: O2 at night    Prior Function Level of Independence: Independent with assistive device(s)         Comments: reports occasionally using RW     Hand Dominance   Dominant Hand: Right    Extremity/Trunk Assessment   Upper Extremity Assessment: Generalized weakness           Lower Extremity Assessment: Generalized weakness      Cervical / Trunk Assessment: Kyphotic  Communication   Communication: No difficulties  Cognition Arousal/Alertness: Awake/alert Behavior During Therapy: WFL for tasks assessed/performed Overall Cognitive Status: No family/caregiver present to determine baseline cognitive functioning                      General Comments      Exercises        Assessment/Plan  PT Assessment Patient needs continued PT services  PT Diagnosis Difficulty walking;Abnormality of gait;Generalized weakness   PT Problem List Decreased strength;Decreased activity tolerance;Decreased balance;Decreased mobility;Decreased safety awareness;Cardiopulmonary status limiting activity  PT Treatment Interventions DME instruction;Gait  training;Functional mobility training;Therapeutic activities;Therapeutic exercise;Balance training;Patient/family education   PT Goals (Current goals can be found in the Care Plan section) Acute Rehab PT Goals Patient Stated Goal: to go home and not have to have people with her PT Goal Formulation: With patient Time For Goal Achievement: 11/26/14 Potential to Achieve Goals: Good    Frequency Min 3X/week   Barriers to discharge        Co-evaluation               End of Session Equipment Utilized During Treatment: Gait belt;Oxygen Activity Tolerance: Patient limited by fatigue Patient left: in chair;with call bell/phone within reach (w/c) Nurse Communication: Mobility status         Time: 2505-3976 PT Time Calculation (min) (ACUTE ONLY): 25 min   Charges:   PT Evaluation $Initial PT Evaluation Tier I: 1 Procedure PT Treatments $Gait Training: 8-22 mins   PT G CodesDuncan Dull 11-30-2014, 3:20 PM Alben Deeds, [place] DPT  [phone]

## 2014-11-12 NOTE — Progress Notes (Signed)
Transfer note:  Arrival Method: Wheelchair from 5m Mental Orientation: A&OX4 Telemetry: 1O10 CCMD notified Assessment: See flowsheets Skin: Dry; intact IV: R hand; LIJ Pain: Denies Tubes: N/A Safety Measures: yellow socks placed, chair alarm activated, call light and all other belongings within reach  6700 Orientation: Patient has been oriented to the unit, staff and to the room.  Orders have been reviewed and implemented.  Gavin Potters

## 2014-11-13 DIAGNOSIS — J431 Panlobular emphysema: Secondary | ICD-10-CM

## 2014-11-13 LAB — CBC
HCT: 35 % — ABNORMAL LOW (ref 36.0–46.0)
Hemoglobin: 10.9 g/dL — ABNORMAL LOW (ref 12.0–15.0)
MCH: 30.1 pg (ref 26.0–34.0)
MCHC: 31.1 g/dL (ref 30.0–36.0)
MCV: 96.7 fL (ref 78.0–100.0)
Platelets: 129 10*3/uL — ABNORMAL LOW (ref 150–400)
RBC: 3.62 MIL/uL — ABNORMAL LOW (ref 3.87–5.11)
RDW: 13.7 % (ref 11.5–15.5)
WBC: 3.3 10*3/uL — ABNORMAL LOW (ref 4.0–10.5)

## 2014-11-13 LAB — BASIC METABOLIC PANEL
Anion gap: 6 (ref 5–15)
BUN: 6 mg/dL (ref 6–20)
CO2: 34 mmol/L — ABNORMAL HIGH (ref 22–32)
Calcium: 8.5 mg/dL — ABNORMAL LOW (ref 8.9–10.3)
Chloride: 99 mmol/L — ABNORMAL LOW (ref 101–111)
Creatinine, Ser: 0.71 mg/dL (ref 0.44–1.00)
GFR calc Af Amer: >60 mL/min (ref >60)
GFR calc non Af Amer: >60 mL/min (ref >60)
Glucose, Bld: 106 mg/dL — ABNORMAL HIGH (ref 65–99)
Potassium: 4.6 mmol/L (ref 3.5–5.1)
Sodium: 139 mmol/L (ref 135–145)

## 2014-11-13 NOTE — Care Management Note (Addendum)
Case Management Note  Patient Details  Name: Karys Meckley MRN: [id number] Date of Birth: 09-25-1925  Subjective/Objective: 81 y.o. F admitted 11/09/2014 from home alone with Sepsis from Colitis and aspiration Pneumonia has PMH COPD. Reports that she uses Oxygen at home @2L /M via [place] [facility]  and [facility] provides this. Will continue at discharge if she does not go to SNF for ST rehab prior to returning home. Both she and daughter realize she is weak and needs to gain strength. Suggested they begin to think about alternative living arrangements rather than alone. Both receptive.                    Action/Plan:Will continue to follow for disposition and discharge needs. Will alert CSW for possible SNF.    Expected Discharge Date:                  Expected Discharge Plan:  Skilled Nursing Facility  In-House Referral:  Clinical Social Work  Discharge planning Services  CM Consult  Post Acute Care Choice:    Choice offered to:  Patient, Adult Children (Daughter [name] @ bedside)  DME Arranged:    DME Agency:     HH Arranged:    [place] Agency:     Status of Service:  In process, will continue to follow  Medicare Important Message Given:  Yes-second notification given Date Medicare IM Given:    Medicare IM give by:    Date Additional Medicare IM Given:    Additional Medicare Important Message give by:     If discussed at [place] of Stay Meetings, dates discussed:    Additional Comments:  Delrae Sawyers, RN 11/13/2014, 6:22 PM

## 2014-11-13 NOTE — Progress Notes (Signed)
Physical Therapy Treatment Patient Details Name: [name] [name] MRN: [id number] DOB: 05/05/1925 Today's Date: 11/13/2014    History of Present Illness 81 y/o female with COPD admitted on 10/13 with a sepsis syndrome likely from colitis and aspiration pneumonia    PT Comments    Pt with increased ambulation tolerance this date but con't to have over generalized deconditioning with decreased activity tolerance. Pt now requires use of RW for safe amb and energy conservation. Pt unsafe to d/c home alone as she is unable to complete ADLs and IADLS without assist. Pt aware. Pt's bp 112/67. Acute PT to con't to follow to progress mobility as able.\   Follow Up Recommendations  SNF;Supervision/Assistance - 24 hour (however pt stongly desires to go home)     Equipment Recommendations  None recommended by PT    Recommendations for Other Services       Precautions / Restrictions Precautions Precautions: Fall Restrictions Weight Bearing Restrictions: No    Mobility  Bed Mobility               General bed mobility comments: pt on bsc  Transfers Overall transfer level: Needs assistance Equipment used: 1 person hand held assist Transfers: Sit to/from Stand Sit to Stand: Min guard         General transfer comment: good use of hands  Ambulation/Gait Ambulation/Gait assistance: Min assist Ambulation Distance (Feet): 120 Feet Assistive device: Rolling walker (2 wheeled) Gait Pattern/deviations: Step-through pattern;Decreased stride length Gait velocity: dec Gait velocity interpretation: Below normal speed for age/gender General Gait Details: slow but steady with RW, v/c's to increase trunk extension. pt with mild SOB, dizziness ceased. unable to amb safely without RW, pt reaching for somethign to hold onto when attempting to amb without AD   Stairs            Wheelchair Mobility    Modified Rankin (Stroke Patients Only)       Balance           Standing  balance support: During functional activity Standing balance-Leahy Scale: Fair Standing balance comment: pt able to stand and perform pericare s/p tolieting                    Cognition Arousal/Alertness: Awake/alert Behavior During Therapy: WFL for tasks assessed/performed Overall Cognitive Status: Within Functional Limits for tasks assessed                      Exercises      General Comments        Pertinent Vitals/Pain Pain Assessment: No/denies pain    Home Living                      Prior Function            PT Goals (current goals can now be found in the care plan section) Acute Rehab PT Goals Patient Stated Goal: go home Progress towards PT goals: Progressing toward goals    Frequency  Min 3X/week    PT Plan Current plan remains appropriate    Co-evaluation             End of Session Equipment Utilized During Treatment: Gait belt;Oxygen Activity Tolerance: Patient limited by fatigue Patient left: in chair;with call bell/phone within reach;with family/visitor present;with chair alarm set     Time: 0258-5277 PT Time Calculation (min) (ACUTE ONLY): 34 min  Charges:  $Gait Training: 8-22 mins $Therapeutic Activity: 8-22 mins  G CodesKingsley Callander 11/13/2014, 3:03 PM   Kittie Plater, PT, DPT Pager #: [phone] Office #: [phone]

## 2014-11-13 NOTE — Progress Notes (Signed)
[facility] PROGRESS NOTE  [name] [name] QTM:[id number] DOB: 04-19-1925 DOA: 11/09/2014 PCP: [name], NP  HPI/Brief narrative This is an 81 y/o female with COPD and diastolic heart failure and recurrent episodes of diverticulitis/colitis who came to the [facility] ED on 10/13 with nausea, vomiting, belly pain and weakness. She was in her usual state of health on 10/12 (very active, lives independently), but after going to the grocery store in the afternoon she suddenly had the onset of nausea, vomiting, and (by report) abdominal pain. She continued to have nausea and vomiting all night long so her family brought her to the ED. In the ED she was noted to be tachycardic, hypotensive, and to have a high lactic acid. She was given IVF and antibiotics. PCCM was consulted because she remained hypotensive after 2.5L saline and she became dyspneic.   The patient was admitted to the ICU service and required pressor support which was stopped. The patient was then transferred to the Hospitalist service on 10/17.  Assessment/Plan: 1. Presenting ileus with possible colitis 1. Symptoms seem improved. Pt seen this AM eating breakfast without difficulty 2. Pt was continued on zosyn initially with proiotic in ICU, since transitioned to rocephin on 10/16 3. Cont w/ supportive care 2. Aspiration PNA with sepsis and septic shock 1. Per above, completed 3 days of zosyn in the ED with abx changed to rocephin as of 10/16 2. On min O2 support 3. Afebrile 4. Given concerns of presenting aspiration, will consult SLP for evaluation. Presently seems to tolerate heart healthy diet 3. Ecoli UTI    1. 10/13 urine cx pos for >100,000 pan-sensitive ecoli 2. Pt continued on rocephin per above                                    4. Possible acute on chronic diastolic CHF 1. Volume overload after volume resuscitation in the ICU noted 2. Pt with EF of 55-60% on a 3/16 echo 3. Given acute volume overload,  would repeat echo for interval changes 5. ARF 1. Essentially resolved 2. Currently baseline normal renal function 6. Thrombocytopenia 1. Monitor closely 2. Prophylactic heparin was d/c'd on 10/16 3. No signs of active bleeding at this time 7. Encephalopathy 1. Pt seems to be at baseline mental state 2. Continue to monitor 8. DVT prophylaxis 1. Heparin subq was d/c'd 10/16 secondary to worsening thrombocytopenia 2. SCD's ordered  Code Status: Full Family Communication: Pt in room, family at bedside Disposition Plan: pending possible SNF   Consultants:  Critical Care  Procedures:    Antibiotics: Anti-infectives    Start     Dose/Rate Route Frequency Ordered Stop   11/12/14 0930  cefTRIAXone (ROCEPHIN) 1 g in dextrose 5 % 50 mL IVPB     1 g 100 mL/hr over 30 Minutes Intravenous Every 24 hours 11/12/14 0857     11/09/14 1100  piperacillin-tazobactam (ZOSYN) IVPB 3.375 g  Status:  Discontinued     3.375 g 12.5 mL/hr over 240 Minutes Intravenous Every 8 hours 11/09/14 0824 11/12/14 0856   11/09/14 0830  piperacillin-tazobactam (ZOSYN) IVPB 3.375 g  Status:  Discontinued     3.375 g 100 mL/hr over 30 Minutes Intravenous  Once 11/09/14 0813 11/09/14 0822   11/09/14 0600  vancomycin (VANCOCIN) IVPB 1000 mg/200 mL premix     1,000 mg 200 mL/hr over 60 Minutes Intravenous  Once 11/09/14 0549 11/09/14 0656  11/09/14 0415  piperacillin-tazobactam (ZOSYN) IVPB 3.375 g     3.375 g 100 mL/hr over 30 Minutes Intravenous  Once 11/09/14 0401 11/09/14 0503      HPI/Subjective: States feeling better today. Tolerating breakfast  Objective: Filed Vitals:   11/12/14 2100 11/12/14 2140 11/13/14 0520 11/13/14 1000  BP:  139/72 126/59 110/57  Pulse: 120 98 92 64  Temp:  98 F (36.7 C) 98 F (36.7 C) 98.4 F (36.9 C)  TempSrc:  Oral Oral Oral  Resp: 18 17 16 18   Height:      Weight:  52.572 kg (115 lb 14.4 oz)    SpO2: 92% 98% 94% 99%    Intake/Output Summary (Last 24 hours)  at 11/13/14 1601 Last data filed at 11/13/14 0900  Gross per 24 hour  Intake      3 ml  Output    350 ml  Net   -347 ml   Filed Weights   11/11/14 2100 11/12/14 0400 11/12/14 2140  Weight: 52.5 kg (115 lb 11.9 oz) 52.6 kg (115 lb 15.4 oz) 52.572 kg (115 lb 14.4 oz)    Exam:   General:  Awake, in nad  Cardiovascular: regular, s1, s2  Respiratory: normal resp effort, no wheezing  Abdomen: soft, pos BS  Musculoskeletal: perfused, no clubbing   Data Reviewed: Basic Metabolic Panel:  Recent Labs Lab 11/09/14 1900 11/10/14 0228 11/11/14 0422 11/12/14 0354 11/13/14 0514  NA 133* 137 133* 142 139  K 4.5 5.1 3.9 4.1 4.6  CL 101 103 98* 103 99*  CO2 25 27 28 28  34*  GLUCOSE 152* 123* 106* 111* 106*  BUN 23* 20 11 8 6   CREATININE 1.00 0.94 0.73 0.78 0.71  CALCIUM 7.4* 8.0* 8.0* 8.7* 8.5*  MG  --   --   --  1.9  --   PHOS  --   --   --  3.3  --    Liver Function Tests:  Recent Labs Lab 11/09/14 0324  AST 46*  ALT 24  ALKPHOS 95  BILITOT 0.4  PROT 6.3*  ALBUMIN 3.4*    Recent Labs Lab 11/09/14 0324  LIPASE 22   No results for input(s): AMMONIA in the last 168 hours. CBC:  Recent Labs Lab 11/09/14 0324 11/09/14 0823 11/10/14 0228 11/11/14 0422 11/12/14 0354 11/13/14 0514  WBC 22.8* 19.1* 11.9* 6.8 4.7 3.3*  NEUTROABS 21.0*  --   --   --   --   --   HGB 14.7 14.4 12.1 10.9* 11.1* 10.9*  HCT 45.3 45.1 38.0 33.4* 35.0* 35.0*  MCV 94.8 94.5 96.2 94.9 95.9 96.7  PLT 203 181 147* 117* 115* 129*   Cardiac Enzymes: No results for input(s): CKTOTAL, CKMB, CKMBINDEX, TROPONINI in the last 168 hours. BNP (last 3 results)  Recent Labs  03/26/14 1310 03/28/14 1903 11/09/14 0920  BNP 101.3* 83.2 827.2*    ProBNP (last 3 results) No results for input(s): PROBNP in the last 8760 hours.  CBG:  Recent Labs Lab 11/09/14 0848 11/09/14 1201 11/10/14 1121 11/11/14 1621  GLUCAP 143* 160* 144* 105*    Recent Results (from the past 240 hour(s))   Blood Culture (routine x 2)     Status: None (Preliminary result)   Collection Time: 11/09/14  4:15 AM  Result Value Ref Range Status   Specimen Description BLOOD LEFT FOREARM  Final   Special Requests BOTTLES DRAWN AEROBIC AND ANAEROBIC 5CC  Final   Culture NO GROWTH 4 DAYS  Final  Report Status PENDING  Incomplete  Blood Culture (routine x 2)     Status: None (Preliminary result)   Collection Time: 11/09/14  4:27 AM  Result Value Ref Range Status   Specimen Description BLOOD LEFT HAND  Final   Special Requests BOTTLES DRAWN AEROBIC AND ANAEROBIC 5CC  Final   Culture NO GROWTH 4 DAYS  Final   Report Status PENDING  Incomplete  Urine culture     Status: None   Collection Time: 11/09/14  4:38 AM  Result Value Ref Range Status   Specimen Description URINE, CATHETERIZED  Final   Special Requests NONE  Final   Culture >=100,000 COLONIES/mL ESCHERICHIA COLI  Final   Report Status 11/11/2014 FINAL  Final   Organism ID, Bacteria ESCHERICHIA COLI  Final      Susceptibility   Escherichia coli - MIC*    AMPICILLIN 4 SENSITIVE Sensitive     CEFAZOLIN <=4 SENSITIVE Sensitive     CEFTRIAXONE <=1 SENSITIVE Sensitive     CIPROFLOXACIN <=0.25 SENSITIVE Sensitive     GENTAMICIN <=1 SENSITIVE Sensitive     IMIPENEM <=0.25 SENSITIVE Sensitive     NITROFURANTOIN <=16 SENSITIVE Sensitive     TRIMETH/SULFA <=20 SENSITIVE Sensitive     AMPICILLIN/SULBACTAM 4 SENSITIVE Sensitive     PIP/TAZO <=4 SENSITIVE Sensitive     * >=100,000 COLONIES/mL ESCHERICHIA COLI  MRSA PCR Screening     Status: None   Collection Time: 11/09/14  8:40 AM  Result Value Ref Range Status   MRSA by PCR NEGATIVE NEGATIVE Final    Comment:        The GeneXpert MRSA Assay (FDA approved for NASAL specimens only), is one component of a comprehensive MRSA colonization surveillance program. It is not intended to diagnose MRSA infection nor to guide or monitor treatment for MRSA infections.      Studies: No results  found.  Scheduled Meds: . acidophilus  1 capsule Oral Daily  . antiseptic oral rinse  7 mL Mouth Rinse BID  . arformoterol  15 mcg Nebulization BID  . aspirin  325 mg Oral Daily  . budesonide (PULMICORT) nebulizer solution  0.5 mg Nebulization BID  . cefTRIAXone (ROCEPHIN)  IV  1 g Intravenous Q24H  . fluticasone  2 spray Each Nare Daily  . gabapentin  100 mg Oral BID  . guaiFENesin  600 mg Oral BID  . metoprolol  12.5 mg Oral BID  . pantoprazole  40 mg Oral Daily  . sodium chloride  500 mL Intravenous Once  . sodium chloride  3 mL Intravenous Q12H   Continuous Infusions:   Principal Problem:   Sepsis ([place]) Active Problems:   HYPERCHOLESTEROLEMIA   Hearing loss   Essential hypertension   COPD with emphysema, gold stage C.   CHF (congestive heart failure) (HCC)   Aspiration pneumonia ([place])   Halyn Flaugher, [place] [facility] Pager [phone]. If 7PM-7AM, please contact night-coverage at [email or web address], password [facility] 11/13/2014, 4:01 PM  LOS: 4 days

## 2014-11-13 NOTE — Care Management Important Message (Signed)
Important Message  Patient Details  Name: [name] [name] MRN: [id number] Date of Birth: 1926-01-26   Medicare Important Message Given:  Yes-second notification given    Delorse Lek 11/13/2014, 2:00 PM

## 2014-11-13 NOTE — Progress Notes (Signed)
Physical medicine rehabilitation consult requested chart reviewed. Therapy evaluation completed 11/12/2014 patient progressing nicely ambulating minimal assist pushing her IV pole. Should continue to do well recommendations are for skilled nursing facility versus home health therapies. At this time patient not meeting criteria for inpatient rehabilitation services. Hold on formal rehabilitation consult at this time

## 2014-11-14 ENCOUNTER — Inpatient Hospital Stay (HOSPITAL_COMMUNITY): Payer: Medicare Other

## 2014-11-14 DIAGNOSIS — I509 Heart failure, unspecified: Secondary | ICD-10-CM

## 2014-11-14 LAB — CULTURE, BLOOD (ROUTINE X 2)
Culture: NO GROWTH
Culture: NO GROWTH

## 2014-11-14 MED ORDER — FUROSEMIDE 10 MG/ML IJ SOLN
40.0000 mg | Freq: Once | INTRAMUSCULAR | Status: AC
  Administered 2014-11-14: 40 mg via INTRAVENOUS
  Filled 2014-11-14: qty 4

## 2014-11-14 NOTE — Progress Notes (Signed)
  Echocardiogram 2D Echocardiogram has been performed.  Jennette Dubin 11/14/2014, 11:08 AM

## 2014-11-14 NOTE — Progress Notes (Signed)
[facility] PROGRESS NOTE  [name] [name] LSL:[id number] DOB: 1925-03-18 DOA: 11/09/2014 PCP: [name], NP  HPI/Brief narrative This is an 81 y/o female with COPD and diastolic heart failure and recurrent episodes of diverticulitis/colitis who came to the [facility] ED on 10/13 with nausea, vomiting, belly pain and weakness. She was in her usual state of health on 10/12 (very active, lives independently), but after going to the grocery store in the afternoon she suddenly had the onset of nausea, vomiting, and (by report) abdominal pain. She continued to have nausea and vomiting all night long so her family brought her to the ED. In the ED she was noted to be tachycardic, hypotensive, and to have a high lactic acid. She was given IVF and antibiotics. PCCM was consulted because she remained hypotensive after 2.5L saline and she became dyspneic.   The patient was admitted to the ICU service and required pressor support which was stopped. The patient was then transferred to the Hospitalist service on 10/17.  Assessment/Plan: 1. Presenting ileus with possible colitis 1. Symptoms seem improved. Pt seen this AM eating breakfast without difficulty 2. Pt was continued on zosyn initially with proiotic in ICU, since transitioned to rocephin on 10/16 3. Cont w/ supportive care 2. Aspiration PNA with sepsis and septic shock 1. Per above, completed 3 days of zosyn in the ED with abx changed to rocephin as of 10/16 2. On min O2 support 3. Afebrile 4. Given concerns of presenting aspiration, will consult SLP for evaluation. Presently seems to tolerate heart healthy diet 3. Ecoli UTI    1. 10/13 urine cx pos for >100,000 pan-sensitive ecoli 2. Pt continued on rocephin per above                                    4. Possible acute on chronic systolic CHF 1. Pt with EF of 40-45% on 10/18 2d echo (down from 50-55% on prior study) 2. Pt reports poor exercise tolerance and increased  orthopnea in the setting of recent aggressive volume resuscitation in the ICU 3. CXR with findings of volume overload and patient reports subjective SOB when off O2 4. Will give trial of one dose of IV lasix 5. BP stable at present and renal function currently normal 6. Monitor i/o 5. ARF 1. Essentially resolved 2. Currently baseline normal renal function 6. Thrombocytopenia 1. Monitor closely 2. Prophylactic heparin was d/c'd on 10/16 3. No signs of active bleeding at this time 7. Encephalopathy 1. Pt seems to be at baseline mental state 2. Continue to monitor 8. DVT prophylaxis 1. Heparin subq was d/c'd 10/16 secondary to worsening thrombocytopenia 2. SCD's ordered  Code Status: Full Family Communication: Pt in room Disposition Plan: pending possible SNF   Consultants:  Critical Care  Procedures:    Antibiotics: Anti-infectives    Start     Dose/Rate Route Frequency Ordered Stop   11/12/14 0930  cefTRIAXone (ROCEPHIN) 1 g in dextrose 5 % 50 mL IVPB     1 g 100 mL/hr over 30 Minutes Intravenous Every 24 hours 11/12/14 0857     11/09/14 1100  piperacillin-tazobactam (ZOSYN) IVPB 3.375 g  Status:  Discontinued     3.375 g 12.5 mL/hr over 240 Minutes Intravenous Every 8 hours 11/09/14 0824 11/12/14 0856   11/09/14 0830  piperacillin-tazobactam (ZOSYN) IVPB 3.375 g  Status:  Discontinued     3.375 g 100 mL/hr over 30  Minutes Intravenous  Once 11/09/14 0813 11/09/14 0822   11/09/14 0600  vancomycin (VANCOCIN) IVPB 1000 mg/200 mL premix     1,000 mg 200 mL/hr over 60 Minutes Intravenous  Once 11/09/14 0549 11/09/14 0656   11/09/14 0415  piperacillin-tazobactam (ZOSYN) IVPB 3.375 g     3.375 g 100 mL/hr over 30 Minutes Intravenous  Once 11/09/14 0401 11/09/14 0503      HPI/Subjective: Reports mild subjective sob  Objective: Filed Vitals:   11/14/14 0515 11/14/14 0757 11/14/14 0906 11/14/14 0946  BP: 106/47 130/62    Pulse: 82 96 90   Temp: 98.5 F (36.9 C) 98.5  F (36.9 C)    TempSrc: Oral Oral    Resp: 19 19 18    Height:      Weight:    53.1 kg (117 lb 1 oz)  SpO2: 98% 100% 100%     Intake/Output Summary (Last 24 hours) at 11/14/14 1534 Last data filed at 11/14/14 1134  Gross per 24 hour  Intake     50 ml  Output   1100 ml  Net  -1050 ml   Filed Weights   11/12/14 0400 11/12/14 2140 11/14/14 0946  Weight: 52.6 kg (115 lb 15.4 oz) 52.572 kg (115 lb 14.4 oz) 53.1 kg (117 lb 1 oz)    Exam:   General:  Awake, sitting in chair, [place] in place, in nad  Cardiovascular: regular, s1, s2  Respiratory: normal resp effort, no wheezing  Abdomen: soft, pos BS  Musculoskeletal: perfused, no clubbing   Data Reviewed: Basic Metabolic Panel:  Recent Labs Lab 11/09/14 1900 11/10/14 0228 11/11/14 0422 11/12/14 0354 11/13/14 0514  NA 133* 137 133* 142 139  K 4.5 5.1 3.9 4.1 4.6  CL 101 103 98* 103 99*  CO2 25 27 28 28  34*  GLUCOSE 152* 123* 106* 111* 106*  BUN 23* 20 11 8 6   CREATININE 1.00 0.94 0.73 0.78 0.71  CALCIUM 7.4* 8.0* 8.0* 8.7* 8.5*  MG  --   --   --  1.9  --   PHOS  --   --   --  3.3  --    Liver Function Tests:  Recent Labs Lab 11/09/14 0324  AST 46*  ALT 24  ALKPHOS 95  BILITOT 0.4  PROT 6.3*  ALBUMIN 3.4*    Recent Labs Lab 11/09/14 0324  LIPASE 22   No results for input(s): AMMONIA in the last 168 hours. CBC:  Recent Labs Lab 11/09/14 0324 11/09/14 0823 11/10/14 0228 11/11/14 0422 11/12/14 0354 11/13/14 0514  WBC 22.8* 19.1* 11.9* 6.8 4.7 3.3*  NEUTROABS 21.0*  --   --   --   --   --   HGB 14.7 14.4 12.1 10.9* 11.1* 10.9*  HCT 45.3 45.1 38.0 33.4* 35.0* 35.0*  MCV 94.8 94.5 96.2 94.9 95.9 96.7  PLT 203 181 147* 117* 115* 129*   Cardiac Enzymes: No results for input(s): CKTOTAL, CKMB, CKMBINDEX, TROPONINI in the last 168 hours. BNP (last 3 results)  Recent Labs  03/26/14 1310 03/28/14 1903 11/09/14 0920  BNP 101.3* 83.2 827.2*    ProBNP (last 3 results) No results for input(s):  PROBNP in the last 8760 hours.  CBG:  Recent Labs Lab 11/09/14 0848 11/09/14 1201 11/10/14 1121 11/11/14 1621  GLUCAP 143* 160* 144* 105*    Recent Results (from the past 240 hour(s))  Blood Culture (routine x 2)     Status: None   Collection Time: 11/09/14  4:15 AM  Result Value Ref Range Status   Specimen Description BLOOD LEFT FOREARM  Final   Special Requests BOTTLES DRAWN AEROBIC AND ANAEROBIC 5CC  Final   Culture NO GROWTH 5 DAYS  Final   Report Status 11/14/2014 FINAL  Final  Blood Culture (routine x 2)     Status: None   Collection Time: 11/09/14  4:27 AM  Result Value Ref Range Status   Specimen Description BLOOD LEFT HAND  Final   Special Requests BOTTLES DRAWN AEROBIC AND ANAEROBIC 5CC  Final   Culture NO GROWTH 5 DAYS  Final   Report Status 11/14/2014 FINAL  Final  Urine culture     Status: None   Collection Time: 11/09/14  4:38 AM  Result Value Ref Range Status   Specimen Description URINE, CATHETERIZED  Final   Special Requests NONE  Final   Culture >=100,000 COLONIES/mL ESCHERICHIA COLI  Final   Report Status 11/11/2014 FINAL  Final   Organism ID, Bacteria ESCHERICHIA COLI  Final      Susceptibility   Escherichia coli - MIC*    AMPICILLIN 4 SENSITIVE Sensitive     CEFAZOLIN <=4 SENSITIVE Sensitive     CEFTRIAXONE <=1 SENSITIVE Sensitive     CIPROFLOXACIN <=0.25 SENSITIVE Sensitive     GENTAMICIN <=1 SENSITIVE Sensitive     IMIPENEM <=0.25 SENSITIVE Sensitive     NITROFURANTOIN <=16 SENSITIVE Sensitive     TRIMETH/SULFA <=20 SENSITIVE Sensitive     AMPICILLIN/SULBACTAM 4 SENSITIVE Sensitive     PIP/TAZO <=4 SENSITIVE Sensitive     * >=100,000 COLONIES/mL ESCHERICHIA COLI  MRSA PCR Screening     Status: None   Collection Time: 11/09/14  8:40 AM  Result Value Ref Range Status   MRSA by PCR NEGATIVE NEGATIVE Final    Comment:        The GeneXpert MRSA Assay (FDA approved for NASAL specimens only), is one component of a comprehensive MRSA  colonization surveillance program. It is not intended to diagnose MRSA infection nor to guide or monitor treatment for MRSA infections.      Studies: Dg Chest Port 1 View  11/14/2014  CLINICAL DATA:  CHF, shortness of Breath EXAM: PORTABLE CHEST 1 VIEW COMPARISON:  11/11/2014 FINDINGS: Left central line remains in place, unchanged. Bilateral lower lobe airspace opacities with layering effusions are stable. Heart is upper limits normal in size. IMPRESSION: No significant change in bilateral lower lobe airspace opacities and effusions. Electronically Signed   By: Rolm Baptise M.D.   On: 11/14/2014 12:14    Scheduled Meds: . acidophilus  1 capsule Oral Daily  . antiseptic oral rinse  7 mL Mouth Rinse BID  . arformoterol  15 mcg Nebulization BID  . aspirin  325 mg Oral Daily  . budesonide (PULMICORT) nebulizer solution  0.5 mg Nebulization BID  . cefTRIAXone (ROCEPHIN)  IV  1 g Intravenous Q24H  . fluticasone  2 spray Each Nare Daily  . gabapentin  100 mg Oral BID  . guaiFENesin  600 mg Oral BID  . metoprolol  12.5 mg Oral BID  . pantoprazole  40 mg Oral Daily  . sodium chloride  500 mL Intravenous Once  . sodium chloride  3 mL Intravenous Q12H   Continuous Infusions:   Principal Problem:   Sepsis ([place]) Active Problems:   HYPERCHOLESTEROLEMIA   Hearing loss   Essential hypertension   COPD with emphysema, gold stage C.   CHF (congestive heart failure) (HCC)   Aspiration pneumonia ([place])   Sala Tague,  [place] [facility] Pager [phone]. If 7PM-7AM, please contact night-coverage at [email or web address], password [facility] 11/14/2014, 3:34 PM  LOS: 5 days

## 2014-11-14 NOTE — Evaluation (Signed)
Clinical/Bedside Swallow Evaluation Patient Details  Name: Edit Ricciardelli MRN: [id number] Date of Birth: [birth year]  Today's Date: 11/14/2014 Time: SLP Start Time (ACUTE ONLY): 1200 SLP Stop Time (ACUTE ONLY): 1230 SLP Time Calculation (min) (ACUTE ONLY): 30 min  Past Medical History:  Past Medical History  Diagnosis Date  . Osteoporosis   . GERD (gastroesophageal reflux disease)   . Hiatal hernia   . PUD (peptic ulcer disease)   . Cardiac arrhythmia   . Hyperlipidemia   . Hypertension   . Scoliosis deformity of spine     history of intractable back pain  . Choledocholithiasis   . Pancolitis (HCC)     Infectious vs. inflammatory  . Abdominal pain     Resolved  . Chronic anemia   . Chronic back pain   . Pancreatitis     History of  . CHF (congestive heart failure) ([place])     Diastolic  on ECHO 2376  . COPD (chronic obstructive pulmonary disease) (HCC)     oxygen dependent at  night 2LPM  . PONV (postoperative nausea and vomiting)   . Reactive airway disease that is not asthma   . HCAP (healthcare-associated pneumonia) 03/28/2014  . Pneumonia 1930's; 2011 X 2; 02/2014  . On home oxygen therapy     "1L during the day; 2L q hs" (03/28/2014)  . Daily headache   . Osteoarthritis     "pretty much q where"   . Recurrent UTI (urinary tract infection)     "here lately" (03/28/2014)   Past Surgical History:  Past Surgical History  Procedure Laterality Date  . Cholecystectomy  2008  . Abdominal hysterectomy  1970s  . Spine surgery  [phone]  . Cervical laminectomy  1970s  . Spinal cord stimulator implant  ~ 2009  . Ercp w/ sphicterotomy  02/2010  . Pain pump implantation  ~ 2010    back, dilaudid and bupravacaine  . Flexible sigmoidoscopy N/A 01/24/2013    Procedure: FLEXIBLE SIGMOIDOSCOPY;  Surgeon: Missy Sabins, MD;  Location: [place];  Service: Endoscopy;  Laterality: N/A;  . Esophagogastroduodenoscopy N/A 11/14/2013    Procedure: ESOPHAGOGASTRODUODENOSCOPY  (EGD);  Surgeon: Missy Sabins, MD;  Location: [facility]COPY;  Service: Endoscopy;  Laterality: N/A;  . Breast cyst excision Right 1960's  . Back surgery      x 87   HPI:  81 year old female admitted 11/09/14 with weakness, nausea/vomiting. PMH significant for COPD, PNA, GERD, Hiatal Hernia   Assessment / Plan / Recommendation Clinical Impression  Pt presents with adequate oral motor strength and function. No overt s/s aspiration observed or reported. Given pt history of PNA and COPD, MBS is recommended to objectively assess swallow function and safety, and rule out silent aspiration. Pt currently declines participation in [place], stating "I don't see any need to do that".  ST will continue current diet, and follow up for diet tolerance, and to present option for MBS to pt again. RN notified.    Aspiration Risk  Mild    Diet Recommendation Age appropriate regular solids;Thin   Medication Administration: Whole meds with liquid Compensations: Minimize environmental distractions;Slow rate;Small sips/bites    Other  Recommendations Oral Care Recommendations: Oral care BID   Follow Up Recommendations    ST to follow up at bedside   Frequency and Duration min 1 x/week  1 week   Pertinent Vitals/Pain No pain reported    SLP Swallow Goals  Pt will tolerate least restrictive diet without overt s/s aspiration  or decline in respiratory status.   Swallow Study Prior Functional Status   No difficulty reported. Regular diet and thin liquids prior to admit.    General Date of Onset: 11/09/14 Other Pertinent Information: 81 year old female admitted 11/09/14 with weakness, nausea/vomiting. PMH significant for COPD, PNA, GERD, Hiatal Hernia Type of Study: Bedside swallow evaluation Previous Swallow Assessment: BSE 03/29/14 - reg diet/thin no f/u. MBS 01/22/09 - results not available. Diet Prior to this Study: Regular;Thin liquids Temperature Spikes Noted: No Respiratory Status: Supplemental O2  delivered via (comment) History of Recent Intubation: No Behavior/Cognition: Alert;Cooperative;Pleasant mood Oral Cavity - Dentition: Adequate natural dentition/normal for age Self-Feeding Abilities: Able to feed self Baseline Vocal Quality: Normal Volitional Cough: Strong Volitional Swallow: Able to elicit    Oral/Motor/Sensory Function Overall Oral Motor/Sensory Function: Appears within functional limits for tasks assessed   Ice Chips Ice chips: Not tested   Thin Liquid Thin Liquid: Within functional limits Presentation: Cup    Nectar Thick Nectar Thick Liquid: Not tested   Honey Thick Honey Thick Liquid: Not tested   Puree Puree: Within functional limits Presentation: Self Fed   Solid   GO    Solid: Within functional limits Presentation: Self Fed       Aldan Camey Brown 11/14/2014,1:02 PM   Enriqueta Shutter. Quentin Ore [facility], [place] [phone]

## 2014-11-14 NOTE — Progress Notes (Signed)
Occupational Therapy Evaluation Patient Details Name: Desyre Calma MRN: [id number] DOB: 12-12-1925 Today's Date: 11/14/2014    History of Present Illness 81 y/o female with COPD admitted on 10/13 with a sepsis syndrome likely from colitis and aspiration pneumonia   Clinical Impression   PTA, pt lived alone and was independent with ADL and mobility. Pt currently requires min A for mobility and ADL. Able to ambulate @ 150 ft on 1L with O2 97 and HR 108 with 1/4 dypsnea. Pt will benefit from rehab at SNF to facilitate return to PLOF.     Follow Up Recommendations  SNF;Supervision/Assistance - 24 hour    Equipment Recommendations       Recommendations for Other Services       Precautions / Restrictions Precautions Precautions: Fall Restrictions Weight Bearing Restrictions: No      Mobility Bed Mobility               General bed mobility comments: Pt up in chair  Transfers Overall transfer level: Needs assistance Equipment used: 1 person hand held assist Transfers: Sit to/from Stand Sit to Stand: Supervision              Balance     Sitting balance-Leahy Scale: Good       Standing balance-Leahy Scale: Fair                              ADL Overall ADL's : Needs assistance/impaired     Grooming: Set up;Standing   Upper Body Bathing: Set up;Sitting   Lower Body Bathing: Min guard;Sit to/from stand   Upper Body Dressing : Set up;Sitting   Lower Body Dressing: Min guard;Sit to/from stand   Toilet Transfer: Minimal assistance;RW;Regular Museum/gallery exhibitions officer and Hygiene: Min guard;Sit to/from stand       Functional mobility during ADLs: Minimal assistance;Rolling walker General ADL Comments: Good safety awareness during ADL     Vision     Perception     Praxis      Pertinent Vitals/Pain       Hand Dominance Right   Extremity/Trunk Assessment Upper Extremity Assessment Upper Extremity  Assessment: Generalized weakness   Lower Extremity Assessment Lower Extremity Assessment: Generalized weakness   Cervical / Trunk Assessment Cervical / Trunk Assessment: Kyphotic   Communication Communication Communication: No difficulties   Cognition Arousal/Alertness: Awake/alert Behavior During Therapy: WFL for tasks assessed/performed Overall Cognitive Status: Within Functional Limits for tasks assessed                     General Comments       Exercises       Shoulder Instructions      Home Living Family/patient expects to be discharged to:: Private residence Living Arrangements: Alone Available Help at Discharge: Family;Available PRN/intermittently Type of Home: House Home Access: Ramped entrance     Home Layout: One level     Bathroom Shower/Tub: Occupational psychologist: Handicapped height Bathroom Accessibility: Yes How Accessible: Accessible via walker Home Equipment: Lena - 2 wheels;Cane - single point;Bedside commode   Additional Comments: O2 at night      Prior Functioning/Environment Level of Independence: Independent with assistive device(s)        Comments: reports occasionally using RW    OT Diagnosis: Generalized weakness   OT Problem List: Decreased strength;Decreased activity tolerance;Impaired balance (sitting and/or standing);Cardiopulmonary status limiting activity   OT Treatment/Interventions: Self-care/ADL  training;Therapeutic exercise;Energy conservation;DME and/or AE instruction;Therapeutic activities;Patient/family education;Balance training    OT Goals(Current goals can be found in the care plan section) Acute Rehab OT Goals Patient Stated Goal: to go home and not have to have people with her OT Goal Formulation: With patient Time For Goal Achievement: 11/28/14 Potential to Achieve Goals: Good ADL Goals Pt Will Perform Lower Body Bathing: with set-up;with supervision;sit to/from stand Pt Will Perform  Lower Body Dressing: with set-up;with supervision;sit to/from stand Pt Will Transfer to Toilet: with supervision;ambulating Pt Will Perform Toileting - Clothing Manipulation and hygiene: with supervision;sit to/from stand Additional ADL Goal #1: pt will independently verbalize 3 energy conservation techiques for ADL  OT Frequency: Min 2X/week   Barriers to D/C:            Co-evaluation              End of Session Equipment Utilized During Treatment: Gait belt;Rolling walker;Oxygen (1L) Nurse Communication: Mobility status  Activity Tolerance: Patient tolerated treatment well Patient left: in chair;with call bell/phone within reach;with family/visitor present   Time: 2993-7169 OT Time Calculation (min): 25 min Charges:  OT General Charges $OT Visit: 1 Procedure OT Evaluation $Initial OT Evaluation Tier I: 1 Procedure OT Treatments $Self Care/Home Management : 8-22 mins G-Codes:    Zelphia Glover,HILLARY December 03, 2014, 5:06 PM   [facility], OTR/L  [phone] December 03, 2014

## 2014-11-15 ENCOUNTER — Ambulatory Visit: Payer: Medicare Other | Admitting: Pulmonary Disease

## 2014-11-15 ENCOUNTER — Other Ambulatory Visit: Payer: Self-pay | Admitting: Family

## 2014-11-15 DIAGNOSIS — I10 Essential (primary) hypertension: Secondary | ICD-10-CM

## 2014-11-15 DIAGNOSIS — E7800 Pure hypercholesterolemia, unspecified: Secondary | ICD-10-CM

## 2014-11-15 DIAGNOSIS — B9620 Unspecified Escherichia coli [E. coli] as the cause of diseases classified elsewhere: Secondary | ICD-10-CM

## 2014-11-15 DIAGNOSIS — I5021 Acute systolic (congestive) heart failure: Secondary | ICD-10-CM

## 2014-11-15 LAB — BASIC METABOLIC PANEL
Anion gap: 9 (ref 5–15)
BUN: 8 mg/dL (ref 6–20)
CO2: 39 mmol/L — ABNORMAL HIGH (ref 22–32)
Calcium: 9 mg/dL (ref 8.9–10.3)
Chloride: 95 mmol/L — ABNORMAL LOW (ref 101–111)
Creatinine, Ser: 0.77 mg/dL (ref 0.44–1.00)
GFR calc Af Amer: >60 mL/min (ref >60)
GFR calc non Af Amer: >60 mL/min (ref >60)
Glucose, Bld: 102 mg/dL — ABNORMAL HIGH (ref 65–99)
Potassium: 3.9 mmol/L (ref 3.5–5.1)
Sodium: 143 mmol/L (ref 135–145)

## 2014-11-15 MED ORDER — FUROSEMIDE 10 MG/ML IJ SOLN
20.0000 mg | Freq: Once | INTRAMUSCULAR | Status: AC
  Administered 2014-11-15: 20 mg via INTRAVENOUS
  Filled 2014-11-15: qty 2

## 2014-11-15 MED ORDER — SODIUM CHLORIDE 0.9 % IJ SOLN
10.0000 mL | INTRAMUSCULAR | Status: DC | PRN
  Administered 2014-11-15: 30 mL
  Administered 2014-11-17 (×2): 20 mL
  Administered 2014-11-17: 10 mL
  Filled 2014-11-15 (×3): qty 40

## 2014-11-15 NOTE — Progress Notes (Signed)
[facility]                                                                              Patient Demographics  [name] [name], is a 81 y.o. female, DOB - 01-19-1926, MVE:[id number]  Admit date - 11/09/2014   Admitting Physician [name] Doom, MD  Outpatient Primary MD for the patient is [name] Pear., NP  LOS - 6   Chief Complaint  Patient presents with  . Nausea  . Emesis  . Abdominal Pain      HPI on 11/09/2014 by Dr. Simonne [name] This is an 81 y/o female with COPD and diastolic heart failure and recurrent episodes of diverticulitis/colitis who came to the [facility] ED on 10/13 with nausea, vomiting, belly pain and weakness. She was in her usual state of health on 10/12 (very active, lives independently), but after going to the grocery store in the afternoon she suddenly had the onset of nausea, vomiting, and (by report) abdominal pain. Her granddaughter [name], broadcasting) provides much of the history because [name] [name] is sleepy right now. She continued to have nausea and vomiting all night long so her family brought her to the ED. In the ED she was noted to be tachycardic, hypotensive, and to have a high lactic acid. She was given IVF and antibiotics. PCCM was consulted because she remained hypotensive after 2.5L saline and she became dyspneic. On my exam this morning she complains of mild dyspnea, no cough, and she denies abdominal pain at any point throughout this illness.  Assessment & Plan   Septic shock secondary to aspiration pneumonia versus Escherichia coli UTI -Shock resolved, blood pressures have normalized and improved with IV fluids -Patient did require admission to ICU upon admission -Patient received 3 days of Zosyn, antibiotics changed to Rocephin on 11/12/2014  Nausea, vomiting, ileus with possible colitis -CT abdomen/pelvis: Sigmoid and rectal wall thickening, could represent early or chronic colitis in a background of diverticulosis,  mildly dilated proximal small bowel suggests ileus -Patient still has some nausea however feels improvement in her symptoms  Aspiration pneumonia -Continue supplemental oxygen as needed -CT abdomen and pelvis noted small bilateral pleural effusion, right middle and lower lobe bronchopneumonia -Speech therapy consulted -Treatment plan as above  Escherichia coli UTI -Urine culture shows >100K colonies, pansensitive -Continue ceftriaxone  Acute on chronic systolic heart failure -Echocardiogram 11/14/2014 shows an EF of 40-45% -Chest x-ray shows volume overload, patient reports subjective shortness of breath -Patient was given IV Lasix 11/14/2014, urine output 2150 -Will continue small dose of Lasix given soft blood pressures -Continue to monitor intake and output, daily weights  Acute renal failure -Resolved  Thrombocytopenia -Patient was placed on prophylactic heparin which was discontinued on 11/12/2014 -No signs of active bleeding, we'll continue to monitor closely  Acute Encephalopathy -Patient appears to be back at her baseline -Likely secondary to the above  Code Status: Full  Family Communication: None at bedside  Disposition Plan: Admitted  Time Spent in minutes   30 minutes  Procedures  Echocardiogram   Consults   PCCM  DVT Prophylaxis  SCDs  Lab Results  Component Value Date   PLT 129* 11/13/2014    Medications  Scheduled Meds: . acidophilus  1 capsule Oral Daily  . antiseptic oral rinse  7 mL Mouth Rinse BID  . arformoterol  15 mcg Nebulization BID  . aspirin  325 mg Oral Daily  . budesonide (PULMICORT) nebulizer solution  0.5 mg Nebulization BID  . cefTRIAXone (ROCEPHIN)  IV  1 g Intravenous Q24H  . fluticasone  2 spray Each Nare Daily  . gabapentin  100 mg Oral BID  . guaiFENesin  600 mg Oral BID  . metoprolol  12.5 mg Oral BID  . pantoprazole  40 mg Oral Daily  . sodium chloride  500 mL Intravenous Once  . sodium chloride  3 mL Intravenous  Q12H   Continuous Infusions:  PRN Meds:.sodium chloride, sodium chloride, acetaminophen, albuterol, lactulose, ondansetron (ZOFRAN) IV, oxyCODONE-acetaminophen, sodium chloride  Antibiotics    Anti-infectives    Start     Dose/Rate Route Frequency Ordered Stop   11/12/14 0930  cefTRIAXone (ROCEPHIN) 1 g in dextrose 5 % 50 mL IVPB     1 g 100 mL/hr over 30 Minutes Intravenous Every 24 hours 11/12/14 0857     11/09/14 1100  piperacillin-tazobactam (ZOSYN) IVPB 3.375 g  Status:  Discontinued     3.375 g 12.5 mL/hr over 240 Minutes Intravenous Every 8 hours 11/09/14 0824 11/12/14 0856   11/09/14 0830  piperacillin-tazobactam (ZOSYN) IVPB 3.375 g  Status:  Discontinued     3.375 g 100 mL/hr over 30 Minutes Intravenous  Once 11/09/14 0813 11/09/14 0822   11/09/14 0600  vancomycin (VANCOCIN) IVPB 1000 mg/200 mL premix     1,000 mg 200 mL/hr over 60 Minutes Intravenous  Once 11/09/14 0549 11/09/14 0656   11/09/14 0415  piperacillin-tazobactam (ZOSYN) IVPB 3.375 g     3.375 g 100 mL/hr over 30 Minutes Intravenous  Once 11/09/14 0401 11/09/14 0503      Subjective:   [name] [name] seen and examined today.  Patient complains of headache (occurs every morning) and some nausea. Denies any shortness of breath chest pain or abdominal pain at this time.  Objective:   Filed Vitals:   11/14/14 2046 11/15/14 0406 11/15/14 0918 11/15/14 0935  BP:  110/48  112/50  Pulse:  80 76 73  Temp:  98.7 F (37.1 C)  98.3 F (36.8 C)  TempSrc:  Oral  Oral  Resp:  18 18 18   Height:      Weight:      SpO2: 95% 97% 95% 100%    Wt Readings from Last 3 Encounters:  11/14/14 52.8 kg (116 lb 6.5 oz)  06/12/14 50.803 kg (112 lb)  04/19/14 49.045 kg (108 lb 2 oz)     Intake/Output Summary (Last 24 hours) at 11/15/14 1258 Last data filed at 11/15/14 0900  Gross per 24 hour  Intake    600 ml  Output   1950 ml  Net  -1350 ml    Exam  General: Well developed, well nourished, NAD, appears stated  age  HEENT: NCAT, mucous membranes moist.   Cardiovascular: S1 S2 auscultated, no rubs, murmurs or gallops. Regular rate and rhythm.  Respiratory: Diminished breath sounds. Fine crackles  Abdomen: Soft, nontender, nondistended, + bowel sounds  Extremities: warm dry without cyanosis clubbing or edema  Neuro: AAOx3, nonfocal  Data Review   Micro Results Recent Results (from the past 240 hour(s))  Blood Culture (routine x 2)     Status: None   Collection Time: 11/09/14  4:15 AM  Result Value Ref Range Status  Specimen Description BLOOD LEFT FOREARM  Final   Special Requests BOTTLES DRAWN AEROBIC AND ANAEROBIC 5CC  Final   Culture NO GROWTH 5 DAYS  Final   Report Status 11/14/2014 FINAL  Final  Blood Culture (routine x 2)     Status: None   Collection Time: 11/09/14  4:27 AM  Result Value Ref Range Status   Specimen Description BLOOD LEFT HAND  Final   Special Requests BOTTLES DRAWN AEROBIC AND ANAEROBIC 5CC  Final   Culture NO GROWTH 5 DAYS  Final   Report Status 11/14/2014 FINAL  Final  Urine culture     Status: None   Collection Time: 11/09/14  4:38 AM  Result Value Ref Range Status   Specimen Description URINE, CATHETERIZED  Final   Special Requests NONE  Final   Culture >=100,000 COLONIES/mL ESCHERICHIA COLI  Final   Report Status 11/11/2014 FINAL  Final   Organism ID, Bacteria ESCHERICHIA COLI  Final      Susceptibility   Escherichia coli - MIC*    AMPICILLIN 4 SENSITIVE Sensitive     CEFAZOLIN <=4 SENSITIVE Sensitive     CEFTRIAXONE <=1 SENSITIVE Sensitive     CIPROFLOXACIN <=0.25 SENSITIVE Sensitive     GENTAMICIN <=1 SENSITIVE Sensitive     IMIPENEM <=0.25 SENSITIVE Sensitive     NITROFURANTOIN <=16 SENSITIVE Sensitive     TRIMETH/SULFA <=20 SENSITIVE Sensitive     AMPICILLIN/SULBACTAM 4 SENSITIVE Sensitive     PIP/TAZO <=4 SENSITIVE Sensitive     * >=100,000 COLONIES/mL ESCHERICHIA COLI  MRSA PCR Screening     Status: None   Collection Time: 11/09/14  8:40  AM  Result Value Ref Range Status   MRSA by PCR NEGATIVE NEGATIVE Final    Comment:        The GeneXpert MRSA Assay (FDA approved for NASAL specimens only), is one component of a comprehensive MRSA colonization surveillance program. It is not intended to diagnose MRSA infection nor to guide or monitor treatment for MRSA infections.     Radiology Reports Ct Abdomen Pelvis W Contrast  11/09/2014  CLINICAL DATA:  Nausea, vomiting and abdominal pain beginning 4:30 p.m. yesterday afternoon. History of pan colitis, diverticulitis, cholecystectomy, hysterectomy. EXAM: CT ABDOMEN AND PELVIS WITH CONTRAST TECHNIQUE: Multidetector CT imaging of the abdomen and pelvis was performed using the standard protocol following bolus administration of intravenous contrast. CONTRAST:  169mL OMNIPAQUE IOHEXOL 300 MG/ML  SOLN COMPARISON:  CT abdomen and pelvis February 28, 2014 FINDINGS: LUNG BASES: Included view of the lung bases demonstrates small bilateral pleural effusions, patchy consolidation RIGHT middle and lower lobes included heart size is normal, mild Coronary artery calcifications. No pericardial effusions. Contrast in the distal esophagus likely represents reflux. With air bronchograms, tree-in-bud infiltrates RIGHT middle lobe. SOLID ORGANS: The liver mild biliary dilatation pneumobilia, status post cholecystectomy. Spleen, pancreas and adrenal glands are unremarkable. GASTROINTESTINAL TRACT: The stomach is normal in course and caliber without inflammatory changes. Mildly dilated proximal small bowel up to 3.4 cm without discrete transition point. Moderate sigmoid diverticulosis. Mild sigmoid and rectal wall thickening and edema. KIDNEYS/ URINARY TRACT: Kidneys are orthotopic, demonstrating symmetric enhancement. No nephrolithiasis, hydronephrosis or solid renal masses. Too small to characterize hypodensity lower pole RIGHT kidney. The unopacified ureters are normal in course and caliber. Delayed imaging  through the kidneys demonstrates symmetric prompt contrast excretion within the proximal urinary collecting system. Urinary bladder is partially distended and unremarkable. PERITONEUM/RETROPERITONEUM: Aortoiliac vessels are normal in course and caliber, moderate to severe calcific  atherosclerosis. No lymphadenopathy by CT size criteria. Internal reproductive organs are unremarkable. No intraperitoneal free fluid nor free air. SOFT TISSUE/OSSEOUS STRUCTURES: Non-suspicious. Spinal stimulator in RIGHT anterior abdominal wall, LEFT gluteal subcutaneous fat. Status post RIGHT L4-5, L5-S1 laminectomies. Severe dextroscoliosis and degenerative change of lumbar spine. IMPRESSION: Sigmoid and rectal wall thickening, could represent earlier chronic colitis in a background of diverticulosis without convincing evidence of acute diverticulitis. Mildly dilated proximal small bowel suggest ileus without bowel obstruction. Small bilateral pleural effusion. RIGHT middle and lower lobe bronchopneumonia. Electronically Signed   By: Elon Alas M.D.   On: 11/09/2014 05:37   Dg Chest Port 1 View  11/14/2014  CLINICAL DATA:  CHF, shortness of Breath EXAM: PORTABLE CHEST 1 VIEW COMPARISON:  11/11/2014 FINDINGS: Left central line remains in place, unchanged. Bilateral lower lobe airspace opacities with layering effusions are stable. Heart is upper limits normal in size. IMPRESSION: No significant change in bilateral lower lobe airspace opacities and effusions. Electronically Signed   By: Rolm Baptise M.D.   On: 11/14/2014 12:14   Dg Chest Port 1 View  11/11/2014  CLINICAL DATA:  Acute respiratory failure EXAM: PORTABLE CHEST 1 VIEW COMPARISON:  11/09/2014 FINDINGS: Left internal jugular line tip at low SVC. Dorsal spinal stimulator. Normal heart size for level of inspiration. Small layering bilateral pleural effusions. No pneumothorax. Lower lobe predominant interstitial pulmonary edema. No change in patchy bibasilar  airspace disease. IMPRESSION: No significant change since the prior exam. Interstitial edema with small bilateral pleural effusions and bibasilar airspace disease. Electronically Signed   By: Abigail Miyamoto M.D.   On: 11/11/2014 09:28   Dg Chest Portable 1 View  11/09/2014  CLINICAL DATA:  Central line placement on the left EXAM: PORTABLE CHEST 1 VIEW COMPARISON:  11/09/2014 FINDINGS: Left jugular central venous catheter with the tip projecting over the cavoatrial junction. Bilateral small pleural effusions. Bilateral lower lobe interstitial and alveolar airspace opacities. No pneumothorax. Stable cardiomediastinal silhouette. No acute osseous abnormality. Mandibular plate is noted. IMPRESSION: 1. Left jugular central venous catheter with the tip projecting over the cavoatrial junction. 2. Bilateral lower lobe interstitial and alveolar airspace opacities which may reflect mild pulmonary edema versus multilobar pneumonia. Electronically Signed   By: Kathreen Devoid   On: 11/09/2014 09:58   Dg Chest Port 1 View  11/09/2014  CLINICAL DATA:  Nausea, vomiting, and abdominal pain since 04/30 yesterday afternoon. Sepsis. EXAM: PORTABLE CHEST 1 VIEW COMPARISON:  04/12/2014 FINDINGS: Examination is limited due to nonstandard positioning and patient rotation. Shallow inspiration. Probable linear atelectasis in the lung bases. Patchy airspace disease in both lower lungs may represent atelectasis or pneumonia. No blunting of costophrenic angles. No pneumothorax. Normal heart size and pulmonary vascularity. Mediastinal contours appear intact. Thoracolumbar scoliosis. Electrical stimulator leads in the thoracic spine. Postoperative changes in the mandible. IMPRESSION: Shallow inspiration. Linear atelectasis in the lung bases. Airspace disease in the lower lung suggesting pneumonia or atelectasis. Electronically Signed   By: Lucienne Capers M.D.   On: 11/09/2014 04:33    CBC  Recent Labs Lab 11/09/14 0324 11/09/14 0823  11/10/14 0228 11/11/14 0422 11/12/14 0354 11/13/14 0514  WBC 22.8* 19.1* 11.9* 6.8 4.7 3.3*  HGB 14.7 14.4 12.1 10.9* 11.1* 10.9*  HCT 45.3 45.1 38.0 33.4* 35.0* 35.0*  PLT 203 181 147* 117* 115* 129*  MCV 94.8 94.5 96.2 94.9 95.9 96.7  MCH 30.8 30.2 30.6 31.0 30.4 30.1  MCHC 32.5 31.9 31.8 32.6 31.7 31.1  RDW 13.6 13.8 14.1 14.2 14.0  13.7  LYMPHSABS 0.3*  --   --   --   --   --   MONOABS 1.4*  --   --   --   --   --   EOSABS 0.0  --   --   --   --   --   BASOSABS 0.0  --   --   --   --   --     Chemistries   Recent Labs Lab 11/09/14 0324  11/10/14 0228 11/11/14 0422 11/12/14 0354 11/13/14 0514 11/15/14 0413  NA 138  < > 137 133* 142 139 143  K 3.3*  < > 5.1 3.9 4.1 4.6 3.9  CL 98*  < > 103 98* 103 99* 95*  CO2 28  < > 27 28 28  34* 39*  GLUCOSE 168*  < > 123* 106* 111* 106* 102*  BUN 17  < > 20 11 8 6 8   CREATININE 1.02*  < > 0.94 0.73 0.78 0.71 0.77  CALCIUM 8.8*  < > 8.0* 8.0* 8.7* 8.5* 9.0  MG  --   --   --   --  1.9  --   --   AST 46*  --   --   --   --   --   --   ALT 24  --   --   --   --   --   --   ALKPHOS 95  --   --   --   --   --   --   BILITOT 0.4  --   --   --   --   --   --   < > = values in this interval not displayed. ------------------------------------------------------------------------------------------------------------------ estimated creatinine clearance is 35.6 mL/min (by C-G formula based on Cr of 0.77). ------------------------------------------------------------------------------------------------------------------ No results for input(s): HGBA1C in the last 72 hours. ------------------------------------------------------------------------------------------------------------------ No results for input(s): CHOL, HDL, LDLCALC, TRIG, CHOLHDL, LDLDIRECT in the last 72 hours. ------------------------------------------------------------------------------------------------------------------ No results for input(s): TSH, T4TOTAL, T3FREE, THYROIDAB  in the last 72 hours.  Invalid input(s): FREET3 ------------------------------------------------------------------------------------------------------------------ No results for input(s): VITAMINB12, FOLATE, FERRITIN, TIBC, IRON, RETICCTPCT in the last 72 hours.  Coagulation profile No results for input(s): INR, PROTIME in the last 168 hours.  No results for input(s): DDIMER in the last 72 hours.  Cardiac Enzymes No results for input(s): CKMB, TROPONINI, MYOGLOBIN in the last 168 hours.  Invalid input(s): CK ------------------------------------------------------------------------------------------------------------------ Invalid input(s): POCBNP    Everlynn Sagun D.O. on 11/15/2014 at 12:58 PM  Between 7am to 7pm - Pager - [phone]  After 7pm go to [email or web address] - password [email or web address]  And look for the night coverage person covering for me after hours  [facility] Office  [phone]

## 2014-11-15 NOTE — Progress Notes (Signed)
Physical Therapy Treatment Patient Details Name: [name] [name] MRN: [id number] DOB: 09-Jul-1925 Today's Date: 11/15/2014    History of Present Illness 81 y/o female with COPD admitted on 10/13 with a sepsis syndrome likely from colitis and aspiration pneumonia    PT Comments    Less assistance with ambulation today. Complains of headache. Minimal dyspnea but SpO2 on room air after ambulating registered 84% however with poor waveform. After sitting several minutes waveform improved and registered 94% - will continue to monitor this for accuracy. Patient will continue to benefit from skilled physical therapy services to further improve independence with functional mobility.   Follow Up Recommendations  SNF;Supervision/Assistance - 24 hour (however pt stongly desires to go home)     Equipment Recommendations  None recommended by PT    Recommendations for Other Services       Precautions / Restrictions Precautions Precautions: Fall Restrictions Weight Bearing Restrictions: No    Mobility  Bed Mobility               General bed mobility comments: pt in recliner  Transfers Overall transfer level: Needs assistance Equipment used: Rolling walker (2 wheeled) Transfers: Sit to/from Stand Sit to Stand: Min guard         General transfer comment: Min guard for safety. VC for hand placement. Good stability upon standing  Ambulation/Gait Ambulation/Gait assistance: Min guard Ambulation Distance (Feet): 120 Feet Assistive device: Rolling walker (2 wheeled) Gait Pattern/deviations: Step-through pattern;Decreased stride length;Narrow base of support;Trunk flexed Gait velocity: slow Gait velocity interpretation: <1.8 ft/sec, indicative of risk for recurrent falls General Gait Details: Intermittent cues for upright posture and to widen feet. Headache exacerbated by forward gaze, stares at floor. Required one standing rest break to complete distance. SpO2 with poor waveform  registered at mid 80s, with good waveform and prolonged sitting SpO2 mid 90s.   Stairs            Wheelchair Mobility    Modified Rankin (Stroke Patients Only)       Balance                                    Cognition Arousal/Alertness: Awake/alert Behavior During Therapy: WFL for tasks assessed/performed Overall Cognitive Status: Within Functional Limits for tasks assessed                      Exercises      General Comments General comments (skin integrity, edema, etc.): On room air SpO2 poor waveform after ambulating read 84%, after sitting for several minutes registered 94% with good waveform. Will montior      Pertinent Vitals/Pain Pain Assessment: Faces Faces Pain Scale: Hurts little more Pain Location: Headache Pain Descriptors / Indicators: Aching Pain Intervention(s): Monitored during session;Repositioned;Premedicated before session    Home Living                      Prior Function            PT Goals (current goals can now be found in the care plan section) Acute Rehab PT Goals Patient Stated Goal: go home PT Goal Formulation: With patient Time For Goal Achievement: 11/26/14 Potential to Achieve Goals: Good Progress towards PT goals: Progressing toward goals    Frequency  Min 3X/week    PT Plan Current plan remains appropriate    Co-evaluation  End of Session Equipment Utilized During Treatment: Gait belt Activity Tolerance: Patient limited by fatigue Patient left: in chair;with call bell/phone within reach;with family/visitor present;with chair alarm set;with SCD's reapplied     Time: 0511-0211 PT Time Calculation (min) (ACUTE ONLY): 27 min  Charges:  $Gait Training: 23-37 mins                    G Codes:      Ellouise Newer Dec 14, 2014, 5:22 PM Camille Bal [place], [place]

## 2014-11-15 NOTE — Telephone Encounter (Signed)
Please advise below request.  Name from pharmacy:  In chart as:  LACTULOSE 10GM/15ML SOL ML lactulose (CHRONULAC) 10 GM/15ML solution     The source prescription has been discontinued.    Sig: TAKE 2 TEASPOONFULS AT BEDTIME    Dispense: Not specified (Pharmacy requested 500 each)   Start: 11/15/2014   Class: Normal    Requested on: 03/30/2013    Originally ordered on: 03/30/2013 11/30/2013

## 2014-11-15 NOTE — Progress Notes (Signed)
Speech Language Pathology Treatment: Dysphagia  Patient Details Name: [name] [name] MRN: [id number] DOB: [birth year] Today's Date: 11/15/2014 Time: 6580-0634 SLP Time Calculation (min) (ACUTE ONLY): 10 min  Assessment / Plan / Recommendation Clinical Impression  Pt does not show overt signs of aspiration with trials of regular textures and thin liquids. She continues to politely decline MBS to assess for silent aspiration given h/o COPD and PNA. Therefore, given that clinical presentation is [facility], SLP will sign off. Provided education about general aspiration precautions with current diet.   HPI Other Pertinent Information: 81 year old female admitted 11/09/14 with weakness, nausea/vomiting. PMH significant for COPD, PNA, GERD, Hiatal Hernia   Pertinent Vitals Pain Assessment: No/denies pain  SLP Plan  All goals met    Recommendations Diet recommendations: Regular;Thin liquid Liquids provided via: Cup;Straw Medication Administration: Whole meds with liquid Supervision: Patient able to self feed;Intermittent supervision to cue for compensatory strategies Compensations: Minimize environmental distractions;Slow rate;Small sips/bites Postural Changes and/or Swallow Maneuvers: Seated upright 90 degrees       Oral Care Recommendations: Oral care BID Follow up Recommendations: None Plan: All goals met   Germain Osgood, M.A. CCC-SLP [phone]  Germain Osgood 11/15/2014, 11:58 AM

## 2014-11-16 DIAGNOSIS — H9190 Unspecified hearing loss, unspecified ear: Secondary | ICD-10-CM

## 2014-11-16 LAB — CBC
HCT: 34.9 % — ABNORMAL LOW (ref 36.0–46.0)
Hemoglobin: 10.9 g/dL — ABNORMAL LOW (ref 12.0–15.0)
MCH: 30.2 pg (ref 26.0–34.0)
MCHC: 31.2 g/dL (ref 30.0–36.0)
MCV: 96.7 fL (ref 78.0–100.0)
Platelets: 180 10*3/uL (ref 150–400)
RBC: 3.61 MIL/uL — ABNORMAL LOW (ref 3.87–5.11)
RDW: 13.6 % (ref 11.5–15.5)
WBC: 4.7 10*3/uL (ref 4.0–10.5)

## 2014-11-16 LAB — BASIC METABOLIC PANEL
Anion gap: 8 (ref 5–15)
BUN: 10 mg/dL (ref 6–20)
CO2: 37 mmol/L — ABNORMAL HIGH (ref 22–32)
Calcium: 8.8 mg/dL — ABNORMAL LOW (ref 8.9–10.3)
Chloride: 96 mmol/L — ABNORMAL LOW (ref 101–111)
Creatinine, Ser: 0.67 mg/dL (ref 0.44–1.00)
GFR calc Af Amer: >60 mL/min (ref >60)
GFR calc non Af Amer: >60 mL/min (ref >60)
Glucose, Bld: 105 mg/dL — ABNORMAL HIGH (ref 65–99)
Potassium: 3.7 mmol/L (ref 3.5–5.1)
Sodium: 141 mmol/L (ref 135–145)

## 2014-11-16 MED ORDER — MENTHOL 3 MG MT LOZG
1.0000 | LOZENGE | OROMUCOSAL | Status: DC | PRN
  Filled 2014-11-16: qty 9

## 2014-11-16 MED ORDER — BUTALBITAL-APAP-CAFFEINE 50-325-40 MG PO TABS
1.0000 | ORAL_TABLET | Freq: Three times a day (TID) | ORAL | Status: DC | PRN
  Administered 2014-11-16 – 2014-11-18 (×5): 1 via ORAL
  Filled 2014-11-16 (×5): qty 1

## 2014-11-16 MED ORDER — FUROSEMIDE 10 MG/ML IJ SOLN
20.0000 mg | Freq: Every day | INTRAMUSCULAR | Status: DC
  Administered 2014-11-16 – 2014-11-18 (×3): 20 mg via INTRAVENOUS
  Filled 2014-11-16 (×3): qty 2

## 2014-11-16 NOTE — Progress Notes (Addendum)
Occupational Therapy Treatment Patient Details Name: [name] [name] MRN: [id number] DOB: May 28, 1925 Today's Date: 11/16/2014    History of present illness 81 y/o female with COPD admitted on 10/13 with a sepsis syndrome likely from colitis and aspiration pneumonia   OT comments  Short session today due to pt not feeling well. Pt currently on 2L with O2 sats at 97. Pt states she has been SOB "ll day". minguard with mobility at RW level, however, pt states she feels more unsteady due to not feeling well. Continue to recommend SNF for rehab to facilitate return to PLOF.   Follow Up Recommendations  SNF;Supervision/Assistance - 24 hour    Equipment Recommendations       Recommendations for Other Services      Precautions / Restrictions Precautions Precautions: Fall       Mobility Bed Mobility               General bed mobility comments: pt in recliner  Transfers Overall transfer level: Needs assistance Equipment used: Rolling walker (2 wheeled) Transfers: Sit to/from [facility] Sit to Stand: Min guard Stand pivot transfers: Min guard       General transfer comment: Pt reaching to hold on to chair arm today    Balance          standing - fair                         ADL Overall ADL's : Needs assistance/impaired                         Toilet Transfer: Minimal assistance;RW           Functional mobility during ADLs: Minimal assistance;Rolling walker General ADL Comments: Pt not feeling well today. Limited session due to c/o nausea      Vision                     Perception     Praxis      Cognition   Behavior During Therapy: [facility] for tasks assessed/performed Overall Cognitive Status: Within Functional Limits for tasks assessed                       Extremity/Trunk Assessment               Exercises Other Exercises Other Exercises: BUE FF /Abd AROM 10 reps  Encouraged pt to  complete exercises on her own   Shoulder Instructions       General Comments  Pt apologetic about not feeling well. Encouraged pt to wlk later with staff if she felt better. Pt agreed.    Pertinent Vitals/ Pain       Pain Assessment: Faces Faces Pain Scale: Hurts a little bit Pain Location: stomach Pain Descriptors / Indicators: Discomfort Pain Intervention(s): Limited activity within patient's tolerance  Home Living                                          Prior Functioning/Environment              Frequency Min 2X/week     Progress Toward Goals  OT Goals(current goals can now be found in the care plan section)  Progress towards OT goals: Progressing toward goals  Acute Rehab OT Goals Patient Stated Goal:  go home OT Goal Formulation: With patient Time For Goal Achievement: 11/28/14 Potential to Achieve Goals: Good ADL Goals Pt Will Perform Lower Body Bathing: with set-up;with supervision;sit to/from stand Pt Will Perform Lower Body Dressing: with set-up;with supervision;sit to/from stand Pt Will Transfer to Toilet: with supervision;ambulating Pt Will Perform Toileting - Clothing Manipulation and hygiene: with supervision;sit to/from stand Additional ADL Goal #1: pt will independently verbalize 3 energy conservation techiques for ADL  Plan Discharge plan remains appropriate    Co-evaluation                 End of Session Equipment Utilized During Treatment: Rolling walker;Oxygen (2L)   Activity Tolerance Patient limited by fatigue (not feeling well)   Patient Left in chair;with call bell/phone within reach   Nurse Communication Mobility status        Time: 6063-0160 OT Time Calculation (min): 11 min  Charges: OT General Charges $OT Visit: 1 Procedure OT Treatments $Self Care/Home Management : 8-22 mins  Caddie Randle,HILLARY 11/16/2014, 3:49 PM   [facility], OTR/L  [phone] 11/16/2014

## 2014-11-16 NOTE — Progress Notes (Signed)
Pt complained of abdominal fullness, and tightness during day shift . Auscultated active bowel sounds. Notified MD. Administered lactulose and IV lasix per MD order. Will continue to monitor.

## 2014-11-16 NOTE — Care Management Important Message (Signed)
Important Message  Patient Details  Name: [name] [name] MRN: [id number] Date of Birth: 09-11-1925   Medicare Important Message Given:  Yes-third notification given    Delorse Lek 11/16/2014, 12:20 PM

## 2014-11-16 NOTE — Progress Notes (Signed)
[facility]                                                                              Patient Demographics  [name] [name], is a 81 y.o. female, DOB - April 02, 1925, JAS:[id number]  Admit date - 11/09/2014   Admitting Physician Juanito Doom, MD  Outpatient Primary MD for the patient is [name], NP  LOS - 7   Chief Complaint  Patient presents with  . Nausea  . Emesis  . Abdominal Pain      HPI on 11/09/2014 by Dr. Simonne Maffucci This is an 81 y/o female with COPD and diastolic heart failure and recurrent episodes of diverticulitis/colitis who came to the [facility] ED on 10/13 with nausea, vomiting, belly pain and weakness. She was in her usual state of health on 10/12 (very active, lives independently), but after going to the grocery store in the afternoon she suddenly had the onset of nausea, vomiting, and (by report) abdominal pain. Her granddaughter [name] [name], broadcasting) provides much of the history because [name] is sleepy right now. She continued to have nausea and vomiting all night long so her family brought her to the ED. In the ED she was noted to be tachycardic, hypotensive, and to have a high lactic acid. She was given IVF and antibiotics. PCCM was consulted because she remained hypotensive after 2.5L saline and she became dyspneic. On my exam this morning she complains of mild dyspnea, no cough, and she denies abdominal pain at any point throughout this illness.  Assessment & Plan   Septic shock secondary to aspiration pneumonia versus Escherichia coli UTI -Shock resolved, blood pressures have normalized and improved with IV fluids -Patient did require admission to ICU upon admission -Patient received 3 days of Zosyn, antibiotics changed to Rocephin on 11/12/2014  Nausea, vomiting, ileus with possible colitis -CT abdomen/pelvis: Sigmoid and rectal wall thickening, could represent early or chronic colitis in a background of diverticulosis,  mildly dilated proximal small bowel suggests ileus -Denies nausea this morning, no further vomiting   Aspiration pneumonia -Continue supplemental oxygen as needed -CT abdomen and pelvis noted small bilateral pleural effusion, right middle and lower lobe bronchopneumonia -Speech therapy consulted -Treatment plan as above  Escherichia coli UTI -Urine culture shows >100K colonies, pansensitive -Continue ceftriaxone  Acute on chronic systolic heart failure -Echocardiogram 11/14/2014 shows an EF of 40-45% -Chest x-ray shows volume overload, patient reports subjective shortness of breath -Patient was given IV Lasix 11/14/2014 -Will continue small dose of Lasix given soft blood pressures -Continue to monitor intake and output, daily weights  Acute renal failure -Resolved  Thrombocytopenia -Patient was placed on prophylactic heparin which was discontinued on 11/12/2014 -Platelets 180 -No signs of active bleeding, will continue to monitor closely  Acute Encephalopathy -Patient appears to be back at her baseline -Likely secondary to the above  Headache -Will restart fioricet  Chronic normocytic anemia -Hemoglobin 10.9, stable (basline ~ 11) -Continue CBC  Deconditioning  -PT consulted and recommended SNF, but patient desires to go home  Code Status: Full  Family Communication: None at bedside  Disposition Plan: Admitted. Continue to monitor CHF.  Time Spent in minutes   30 minutes  Procedures  Echocardiogram   Consults   PCCM  DVT Prophylaxis  SCDs  Lab Results  Component Value Date   PLT 180 11/16/2014    Medications  Scheduled Meds: . acidophilus  1 capsule Oral Daily  . antiseptic oral rinse  7 mL Mouth Rinse BID  . arformoterol  15 mcg Nebulization BID  . aspirin  325 mg Oral Daily  . budesonide (PULMICORT) nebulizer solution  0.5 mg Nebulization BID  . cefTRIAXone (ROCEPHIN)  IV  1 g Intravenous Q24H  . fluticasone  2 spray Each Nare Daily  .  gabapentin  100 mg Oral BID  . guaiFENesin  600 mg Oral BID  . metoprolol  12.5 mg Oral BID  . pantoprazole  40 mg Oral Daily  . sodium chloride  500 mL Intravenous Once  . sodium chloride  3 mL Intravenous Q12H   Continuous Infusions:  PRN Meds:.sodium chloride, sodium chloride, acetaminophen, albuterol, butalbital-acetaminophen-caffeine, lactulose, ondansetron (ZOFRAN) IV, oxyCODONE-acetaminophen, sodium chloride, sodium chloride  Antibiotics    Anti-infectives    Start     Dose/Rate Route Frequency Ordered Stop   11/12/14 0930  cefTRIAXone (ROCEPHIN) 1 g in dextrose 5 % 50 mL IVPB     1 g 100 mL/hr over 30 Minutes Intravenous Every 24 hours 11/12/14 0857     11/09/14 1100  piperacillin-tazobactam (ZOSYN) IVPB 3.375 g  Status:  Discontinued     3.375 g 12.5 mL/hr over 240 Minutes Intravenous Every 8 hours 11/09/14 0824 11/12/14 0856   11/09/14 0830  piperacillin-tazobactam (ZOSYN) IVPB 3.375 g  Status:  Discontinued     3.375 g 100 mL/hr over 30 Minutes Intravenous  Once 11/09/14 0813 11/09/14 0822   11/09/14 0600  vancomycin (VANCOCIN) IVPB 1000 mg/200 mL premix     1,000 mg 200 mL/hr over 60 Minutes Intravenous  Once 11/09/14 0549 11/09/14 0656   11/09/14 0415  piperacillin-tazobactam (ZOSYN) IVPB 3.375 g     3.375 g 100 mL/hr over 30 Minutes Intravenous  Once 11/09/14 0401 11/09/14 0503      Subjective:   [name] [name] seen and examined today.  Patient continues to complain of headache. She also complains of abdominal fullness, but denies pain, nausea, vomiting. Denies any shortness of breath or chest pain, dizziness.   Objective:   Filed Vitals:   11/15/14 2018 11/16/14 0405 11/16/14 1000 11/16/14 1012  BP: 110/55 122/51 123/60   Pulse: 107 91 88   Temp: 98.6 F (37 C) 98.2 F (36.8 C) 98.6 F (37 C)   TempSrc: Oral Oral Oral   Resp: 19 20    Height:      Weight: 51.5 kg (113 lb 8.6 oz)     SpO2: 95% 92% 96% 95%    Wt Readings from Last 3 Encounters:    11/15/14 51.5 kg (113 lb 8.6 oz)  06/12/14 50.803 kg (112 lb)  04/19/14 49.045 kg (108 lb 2 oz)     Intake/Output Summary (Last 24 hours) at 11/16/14 1251 Last data filed at 11/16/14 1100  Gross per 24 hour  Intake    513 ml  Output    600 ml  Net    -87 ml    Exam  General: Well developed, well nourished, NAD  HEENT: NCAT, mucous membranes moist.   Cardiovascular: S1 S2 auscultated,  RRR  Respiratory: Diminished but clear   Abdomen: Soft, nontender, nondistended, + bowel sounds  Extremities: warm dry without cyanosis clubbing or edema  Neuro: AAOx3, nonfocal  Data Review  Micro Results Recent Results (from the past 240 hour(s))  Blood Culture (routine x 2)     Status: None   Collection Time: 11/09/14  4:15 AM  Result Value Ref Range Status   Specimen Description BLOOD LEFT FOREARM  Final   Special Requests BOTTLES DRAWN AEROBIC AND ANAEROBIC 5CC  Final   Culture NO GROWTH 5 DAYS  Final   Report Status 11/14/2014 FINAL  Final  Blood Culture (routine x 2)     Status: None   Collection Time: 11/09/14  4:27 AM  Result Value Ref Range Status   Specimen Description BLOOD LEFT HAND  Final   Special Requests BOTTLES DRAWN AEROBIC AND ANAEROBIC 5CC  Final   Culture NO GROWTH 5 DAYS  Final   Report Status 11/14/2014 FINAL  Final  Urine culture     Status: None   Collection Time: 11/09/14  4:38 AM  Result Value Ref Range Status   Specimen Description URINE, CATHETERIZED  Final   Special Requests NONE  Final   Culture >=100,000 COLONIES/mL ESCHERICHIA COLI  Final   Report Status 11/11/2014 FINAL  Final   Organism ID, Bacteria ESCHERICHIA COLI  Final      Susceptibility   Escherichia coli - MIC*    AMPICILLIN 4 SENSITIVE Sensitive     CEFAZOLIN <=4 SENSITIVE Sensitive     CEFTRIAXONE <=1 SENSITIVE Sensitive     CIPROFLOXACIN <=0.25 SENSITIVE Sensitive     GENTAMICIN <=1 SENSITIVE Sensitive     IMIPENEM <=0.25 SENSITIVE Sensitive     NITROFURANTOIN <=16 SENSITIVE  Sensitive     TRIMETH/SULFA <=20 SENSITIVE Sensitive     AMPICILLIN/SULBACTAM 4 SENSITIVE Sensitive     PIP/TAZO <=4 SENSITIVE Sensitive     * >=100,000 COLONIES/mL ESCHERICHIA COLI  MRSA PCR Screening     Status: None   Collection Time: 11/09/14  8:40 AM  Result Value Ref Range Status   MRSA by PCR NEGATIVE NEGATIVE Final    Comment:        The GeneXpert MRSA Assay (FDA approved for NASAL specimens only), is one component of a comprehensive MRSA colonization surveillance program. It is not intended to diagnose MRSA infection nor to guide or monitor treatment for MRSA infections.     Radiology Reports Ct Abdomen Pelvis W Contrast  11/09/2014  CLINICAL DATA:  Nausea, vomiting and abdominal pain beginning 4:30 p.m. yesterday afternoon. History of pan colitis, diverticulitis, cholecystectomy, hysterectomy. EXAM: CT ABDOMEN AND PELVIS WITH CONTRAST TECHNIQUE: Multidetector CT imaging of the abdomen and pelvis was performed using the standard protocol following bolus administration of intravenous contrast. CONTRAST:  173mL OMNIPAQUE IOHEXOL 300 MG/ML  SOLN COMPARISON:  CT abdomen and pelvis February 28, 2014 FINDINGS: LUNG BASES: Included view of the lung bases demonstrates small bilateral pleural effusions, patchy consolidation RIGHT middle and lower lobes included heart size is normal, mild Coronary artery calcifications. No pericardial effusions. Contrast in the distal esophagus likely represents reflux. With air bronchograms, tree-in-bud infiltrates RIGHT middle lobe. SOLID ORGANS: The liver mild biliary dilatation pneumobilia, status post cholecystectomy. Spleen, pancreas and adrenal glands are unremarkable. GASTROINTESTINAL TRACT: The stomach is normal in course and caliber without inflammatory changes. Mildly dilated proximal small bowel up to 3.4 cm without discrete transition point. Moderate sigmoid diverticulosis. Mild sigmoid and rectal wall thickening and edema. KIDNEYS/ URINARY TRACT:  Kidneys are orthotopic, demonstrating symmetric enhancement. No nephrolithiasis, hydronephrosis or solid renal masses. Too small to characterize hypodensity lower pole RIGHT kidney. The unopacified ureters are normal in course and  caliber. Delayed imaging through the kidneys demonstrates symmetric prompt contrast excretion within the proximal urinary collecting system. Urinary bladder is partially distended and unremarkable. PERITONEUM/RETROPERITONEUM: Aortoiliac vessels are normal in course and caliber, moderate to severe calcific atherosclerosis. No lymphadenopathy by CT size criteria. Internal reproductive organs are unremarkable. No intraperitoneal free fluid nor free air. SOFT TISSUE/OSSEOUS STRUCTURES: Non-suspicious. Spinal stimulator in RIGHT anterior abdominal wall, LEFT gluteal subcutaneous fat. Status post RIGHT L4-5, L5-S1 laminectomies. Severe dextroscoliosis and degenerative change of lumbar spine. IMPRESSION: Sigmoid and rectal wall thickening, could represent earlier chronic colitis in a background of diverticulosis without convincing evidence of acute diverticulitis. Mildly dilated proximal small bowel suggest ileus without bowel obstruction. Small bilateral pleural effusion. RIGHT middle and lower lobe bronchopneumonia. Electronically Signed   By: Elon Alas M.D.   On: 11/09/2014 05:37   Dg Chest Port 1 View  11/14/2014  CLINICAL DATA:  CHF, shortness of Breath EXAM: PORTABLE CHEST 1 VIEW COMPARISON:  11/11/2014 FINDINGS: Left central line remains in place, unchanged. Bilateral lower lobe airspace opacities with layering effusions are stable. Heart is upper limits normal in size. IMPRESSION: No significant change in bilateral lower lobe airspace opacities and effusions. Electronically Signed   By: Rolm Baptise M.D.   On: 11/14/2014 12:14   Dg Chest Port 1 View  11/11/2014  CLINICAL DATA:  Acute respiratory failure EXAM: PORTABLE CHEST 1 VIEW COMPARISON:  11/09/2014 FINDINGS: Left  internal jugular line tip at low SVC. Dorsal spinal stimulator. Normal heart size for level of inspiration. Small layering bilateral pleural effusions. No pneumothorax. Lower lobe predominant interstitial pulmonary edema. No change in patchy bibasilar airspace disease. IMPRESSION: No significant change since the prior exam. Interstitial edema with small bilateral pleural effusions and bibasilar airspace disease. Electronically Signed   By: Abigail Miyamoto M.D.   On: 11/11/2014 09:28   Dg Chest Portable 1 View  11/09/2014  CLINICAL DATA:  Central line placement on the left EXAM: PORTABLE CHEST 1 VIEW COMPARISON:  11/09/2014 FINDINGS: Left jugular central venous catheter with the tip projecting over the cavoatrial junction. Bilateral small pleural effusions. Bilateral lower lobe interstitial and alveolar airspace opacities. No pneumothorax. Stable cardiomediastinal silhouette. No acute osseous abnormality. Mandibular plate is noted. IMPRESSION: 1. Left jugular central venous catheter with the tip projecting over the cavoatrial junction. 2. Bilateral lower lobe interstitial and alveolar airspace opacities which may reflect mild pulmonary edema versus multilobar pneumonia. Electronically Signed   By: Kathreen Devoid   On: 11/09/2014 09:58   Dg Chest Port 1 View  11/09/2014  CLINICAL DATA:  Nausea, vomiting, and abdominal pain since 04/30 yesterday afternoon. Sepsis. EXAM: PORTABLE CHEST 1 VIEW COMPARISON:  04/12/2014 FINDINGS: Examination is limited due to nonstandard positioning and patient rotation. Shallow inspiration. Probable linear atelectasis in the lung bases. Patchy airspace disease in both lower lungs may represent atelectasis or pneumonia. No blunting of costophrenic angles. No pneumothorax. Normal heart size and pulmonary vascularity. Mediastinal contours appear intact. Thoracolumbar scoliosis. Electrical stimulator leads in the thoracic spine. Postoperative changes in the mandible. IMPRESSION: Shallow  inspiration. Linear atelectasis in the lung bases. Airspace disease in the lower lung suggesting pneumonia or atelectasis. Electronically Signed   By: Lucienne Capers M.D.   On: 11/09/2014 04:33    CBC  Recent Labs Lab 11/10/14 0228 11/11/14 0422 11/12/14 0354 11/13/14 0514 11/16/14 0522  WBC 11.9* 6.8 4.7 3.3* 4.7  HGB 12.1 10.9* 11.1* 10.9* 10.9*  HCT 38.0 33.4* 35.0* 35.0* 34.9*  PLT 147* 117* 115* 129* 180  MCV 96.2 94.9 95.9 96.7 96.7  MCH 30.6 31.0 30.4 30.1 30.2  MCHC 31.8 32.6 31.7 31.1 31.2  RDW 14.1 14.2 14.0 13.7 13.6    Chemistries   Recent Labs Lab 11/11/14 0422 11/12/14 0354 11/13/14 0514 11/15/14 0413 11/16/14 0522  NA 133* 142 139 143 141  K 3.9 4.1 4.6 3.9 3.7  CL 98* 103 99* 95* 96*  CO2 28 28 34* 39* 37*  GLUCOSE 106* 111* 106* 102* 105*  BUN 11 8 6 8 10   CREATININE 0.73 0.78 0.71 0.77 0.67  CALCIUM 8.0* 8.7* 8.5* 9.0 8.8*  MG  --  1.9  --   --   --    ------------------------------------------------------------------------------------------------------------------ estimated creatinine clearance is 35.6 mL/min (by C-G formula based on Cr of 0.67). ------------------------------------------------------------------------------------------------------------------ No results for input(s): HGBA1C in the last 72 hours. ------------------------------------------------------------------------------------------------------------------ No results for input(s): CHOL, HDL, LDLCALC, TRIG, CHOLHDL, LDLDIRECT in the last 72 hours. ------------------------------------------------------------------------------------------------------------------ No results for input(s): TSH, T4TOTAL, T3FREE, THYROIDAB in the last 72 hours.  Invalid input(s): FREET3 ------------------------------------------------------------------------------------------------------------------ No results for input(s): VITAMINB12, FOLATE, FERRITIN, TIBC, IRON, RETICCTPCT in the last 72  hours.  Coagulation profile No results for input(s): INR, PROTIME in the last 168 hours.  No results for input(s): DDIMER in the last 72 hours.  Cardiac Enzymes No results for input(s): CKMB, TROPONINI, MYOGLOBIN in the last 168 hours.  Invalid input(s): CK ------------------------------------------------------------------------------------------------------------------ Invalid input(s): POCBNP    Simpson Paulos D.O. on 11/16/2014 at 12:51 PM  Between 7am to 7pm - Pager - [phone]  After 7pm go to [email or web address] - password [email or web address]  And look for the night coverage person covering for me after hours  [facility] Office  [phone]

## 2014-11-17 ENCOUNTER — Inpatient Hospital Stay (HOSPITAL_COMMUNITY): Payer: Medicare Other

## 2014-11-17 ENCOUNTER — Encounter (HOSPITAL_COMMUNITY): Payer: Self-pay | Admitting: Radiology

## 2014-11-17 DIAGNOSIS — R1084 Generalized abdominal pain: Secondary | ICD-10-CM

## 2014-11-17 LAB — BASIC METABOLIC PANEL
Anion gap: 8 (ref 5–15)
BUN: 14 mg/dL (ref 6–20)
CO2: 39 mmol/L — ABNORMAL HIGH (ref 22–32)
Calcium: 8.8 mg/dL — ABNORMAL LOW (ref 8.9–10.3)
Chloride: 95 mmol/L — ABNORMAL LOW (ref 101–111)
Creatinine, Ser: 0.66 mg/dL (ref 0.44–1.00)
GFR calc Af Amer: >60 mL/min (ref >60)
GFR calc non Af Amer: >60 mL/min (ref >60)
Glucose, Bld: 78 mg/dL (ref 65–99)
Potassium: 3.6 mmol/L (ref 3.5–5.1)
Sodium: 142 mmol/L (ref 135–145)

## 2014-11-17 LAB — CBC
HCT: 32.8 % — ABNORMAL LOW (ref 36.0–46.0)
Hemoglobin: 10.1 g/dL — ABNORMAL LOW (ref 12.0–15.0)
MCH: 30 pg (ref 26.0–34.0)
MCHC: 30.8 g/dL (ref 30.0–36.0)
MCV: 97.3 fL (ref 78.0–100.0)
Platelets: 174 10*3/uL (ref 150–400)
RBC: 3.37 MIL/uL — ABNORMAL LOW (ref 3.87–5.11)
RDW: 13.8 % (ref 11.5–15.5)
WBC: 4.1 10*3/uL (ref 4.0–10.5)

## 2014-11-17 MED ORDER — IOHEXOL 350 MG/ML SOLN
100.0000 mL | Freq: Once | INTRAVENOUS | Status: AC | PRN
  Administered 2014-11-17: 100 mL via INTRAVENOUS

## 2014-11-17 NOTE — Progress Notes (Signed)
[facility]                                                                              Patient Demographics  [name] [name], is a 81 y.o. female, DOB - [birth year], NWG:[id number]  Admit date - 11/09/2014   Admitting Physician Juanito Doom, MD  Outpatient Primary MD for the patient is [name], NP  LOS - 8   Chief Complaint  Patient presents with  . Nausea  . Emesis  . Abdominal Pain      HPI on 11/09/2014 by Dr. Simonne Maffucci This is an 81 y/o female with COPD and diastolic heart failure and recurrent episodes of diverticulitis/colitis who came to the [facility] ED on 10/13 with nausea, vomiting, belly pain and weakness. She was in her usual state of health on 10/12 (very active, lives independently), but after going to the grocery store in the afternoon she suddenly had the onset of nausea, vomiting, and (by report) abdominal pain. Her granddaughter [name], broadcasting) provides much of the history because [name] is sleepy right now. She continued to have nausea and vomiting all night long so her family brought her to the ED. In the ED she was noted to be tachycardic, hypotensive, and to have a high lactic acid. She was given IVF and antibiotics. PCCM was consulted because she remained hypotensive after 2.5L saline and she became dyspneic. On my exam this morning she complains of mild dyspnea, no cough, and she denies abdominal pain at any point throughout this illness.  Assessment & Plan   Septic shock secondary to aspiration pneumonia versus Escherichia coli UTI -Shock resolved, blood pressures have normalized and improved with IV fluids -Patient did require admission to ICU upon admission -Patient received 3 days of Zosyn, antibiotics changed to Rocephin on 11/12/2014  Nausea, vomiting, ileus with possible colitis -CT abdomen/pelvis: Sigmoid and rectal wall thickening, could represent early or chronic colitis in a background of diverticulosis,  mildly dilated proximal small bowel suggests ileus -Denies nausea this morning, no further vomiting  -patient able to tolerate diet -Continues to complain of band like abd pain, however, no pain with palpation.  Spoke with gastroenterologist, Dr. Watt Climes, who recommended obtain CTA abd/pelvis for possible mesenteric ischemia, and outpatient follow up with Dr. Watt Climes in 2 weeks.  -CTA abd/pelvis ordered  Aspiration pneumonia -Continue supplemental oxygen as needed -CT abdomen and pelvis noted small bilateral pleural effusion, right middle and lower lobe bronchopneumonia -Speech therapy consulted -Treatment plan as above  Escherichia coli UTI -Urine culture shows >100K colonies, pansensitive -Continue ceftriaxone  Acute on chronic systolic heart failure -Echocardiogram 11/14/2014 shows an EF of 40-45% -Chest x-ray shows volume overload, patient reports subjective shortness of breath -Patient was given IV Lasix 11/14/2014 -Will continue small dose of Lasix given soft blood pressures -Continue to monitor intake and output, daily weights  Acute renal failure -Resolved  Thrombocytopenia -Patient was placed on prophylactic heparin which was discontinued on 11/12/2014 -Platelets 174 -No signs of active bleeding, will continue to monitor closely  Acute Encephalopathy -Patient appears to be back at her baseline -Likely secondary to the above  Headache -Continue fioricet  Chronic normocytic anemia -Hemoglobin 10.1, stable (basline ~  11) -Continue CBC  Deconditioning  -PT consulted and recommended SNF, but patient desires to go home -Will discharge patient with [facility] services  Code Status: Full  Family Communication: None at bedside. Daughter via phone  Disposition Plan: Admitted. Pending CTA abd/pelvis  Time Spent in minutes   30 minutes  Procedures  Echocardiogram   Consults   PCCM Gastroenterology, Dr. Watt Climes, via phone  DVT Prophylaxis  SCDs  Lab Results  Component  Value Date   PLT 174 11/17/2014    Medications  Scheduled Meds: . acidophilus  1 capsule Oral Daily  . antiseptic oral rinse  7 mL Mouth Rinse BID  . arformoterol  15 mcg Nebulization BID  . aspirin  325 mg Oral Daily  . budesonide (PULMICORT) nebulizer solution  0.5 mg Nebulization BID  . cefTRIAXone (ROCEPHIN)  IV  1 g Intravenous Q24H  . fluticasone  2 spray Each Nare Daily  . furosemide  20 mg Intravenous Daily  . gabapentin  100 mg Oral BID  . guaiFENesin  600 mg Oral BID  . metoprolol  12.5 mg Oral BID  . pantoprazole  40 mg Oral Daily  . sodium chloride  500 mL Intravenous Once  . sodium chloride  3 mL Intravenous Q12H   Continuous Infusions:  PRN Meds:.sodium chloride, sodium chloride, acetaminophen, albuterol, butalbital-acetaminophen-caffeine, lactulose, menthol-cetylpyridinium, ondansetron (ZOFRAN) IV, oxyCODONE-acetaminophen, sodium chloride, sodium chloride  Antibiotics    Anti-infectives    Start     Dose/Rate Route Frequency Ordered Stop   11/12/14 0930  cefTRIAXone (ROCEPHIN) 1 g in dextrose 5 % 50 mL IVPB     1 g 100 mL/hr over 30 Minutes Intravenous Every 24 hours 11/12/14 0857     11/09/14 1100  piperacillin-tazobactam (ZOSYN) IVPB 3.375 g  Status:  Discontinued     3.375 g 12.5 mL/hr over 240 Minutes Intravenous Every 8 hours 11/09/14 0824 11/12/14 0856   11/09/14 0830  piperacillin-tazobactam (ZOSYN) IVPB 3.375 g  Status:  Discontinued     3.375 g 100 mL/hr over 30 Minutes Intravenous  Once 11/09/14 0813 11/09/14 0822   11/09/14 0600  vancomycin (VANCOCIN) IVPB 1000 mg/200 mL premix     1,000 mg 200 mL/hr over 60 Minutes Intravenous  Once 11/09/14 0549 11/09/14 0656   11/09/14 0415  piperacillin-tazobactam (ZOSYN) IVPB 3.375 g     3.375 g 100 mL/hr over 30 Minutes Intravenous  Once 11/09/14 0401 11/09/14 0503      Subjective:   [name] seen and examined today.  Patient continues to complain of headache, but states this happens every morning.   Continues to have abd pain, but denies pain, nausea, vomiting. Denies any shortness of breath or chest pain, dizziness.   Objective:   Filed Vitals:   11/16/14 2009 11/17/14 0443 11/17/14 0918 11/17/14 0956  BP: 117/56 103/54  106/69  Pulse: 90 78  83  Temp: 98.6 F (37 C) 97.8 F (36.6 C)  98.3 F (36.8 C)  TempSrc: Oral Oral  Oral  Resp: 19 20  20   Height:      Weight: 50.3 kg (110 lb 14.3 oz)     SpO2: 97% 100% 94% 100%    Wt Readings from Last 3 Encounters:  11/16/14 50.3 kg (110 lb 14.3 oz)  06/12/14 50.803 kg (112 lb)  04/19/14 49.045 kg (108 lb 2 oz)     Intake/Output Summary (Last 24 hours) at 11/17/14 1315 Last data filed at 11/17/14 0900  Gross per 24 hour  Intake  483 ml  Output    350 ml  Net    133 ml    Exam  General: Well developed, well nourished, NAD  HEENT: NCAT, mucous membranes moist.   Cardiovascular: S1 S2 auscultated,  RRR  Respiratory: Diminished but clear   Abdomen: Soft, nontender, nondistended, + bowel sounds  Extremities: warm dry without cyanosis clubbing or edema  Neuro: AAOx3, nonfocal  Psych: Appropriate mood and affect  Data Review   Micro Results Recent Results (from the past 240 hour(s))  Blood Culture (routine x 2)     Status: None   Collection Time: 11/09/14  4:15 AM  Result Value Ref Range Status   Specimen Description BLOOD LEFT FOREARM  Final   Special Requests BOTTLES DRAWN AEROBIC AND ANAEROBIC 5CC  Final   Culture NO GROWTH 5 DAYS  Final   Report Status 11/14/2014 FINAL  Final  Blood Culture (routine x 2)     Status: None   Collection Time: 11/09/14  4:27 AM  Result Value Ref Range Status   Specimen Description BLOOD LEFT HAND  Final   Special Requests BOTTLES DRAWN AEROBIC AND ANAEROBIC 5CC  Final   Culture NO GROWTH 5 DAYS  Final   Report Status 11/14/2014 FINAL  Final  Urine culture     Status: None   Collection Time: 11/09/14  4:38 AM  Result Value Ref Range Status   Specimen Description URINE,  CATHETERIZED  Final   Special Requests NONE  Final   Culture >=100,000 COLONIES/mL ESCHERICHIA COLI  Final   Report Status 11/11/2014 FINAL  Final   Organism ID, Bacteria ESCHERICHIA COLI  Final      Susceptibility   Escherichia coli - MIC*    AMPICILLIN 4 SENSITIVE Sensitive     CEFAZOLIN <=4 SENSITIVE Sensitive     CEFTRIAXONE <=1 SENSITIVE Sensitive     CIPROFLOXACIN <=0.25 SENSITIVE Sensitive     GENTAMICIN <=1 SENSITIVE Sensitive     IMIPENEM <=0.25 SENSITIVE Sensitive     NITROFURANTOIN <=16 SENSITIVE Sensitive     TRIMETH/SULFA <=20 SENSITIVE Sensitive     AMPICILLIN/SULBACTAM 4 SENSITIVE Sensitive     PIP/TAZO <=4 SENSITIVE Sensitive     * >=100,000 COLONIES/mL ESCHERICHIA COLI  MRSA PCR Screening     Status: None   Collection Time: 11/09/14  8:40 AM  Result Value Ref Range Status   MRSA by PCR NEGATIVE NEGATIVE Final    Comment:        The GeneXpert MRSA Assay (FDA approved for NASAL specimens only), is one component of a comprehensive MRSA colonization surveillance program. It is not intended to diagnose MRSA infection nor to guide or monitor treatment for MRSA infections.     Radiology Reports Ct Abdomen Pelvis W Contrast  11/09/2014  CLINICAL DATA:  Nausea, vomiting and abdominal pain beginning 4:30 p.m. yesterday afternoon. History of pan colitis, diverticulitis, cholecystectomy, hysterectomy. EXAM: CT ABDOMEN AND PELVIS WITH CONTRAST TECHNIQUE: Multidetector CT imaging of the abdomen and pelvis was performed using the standard protocol following bolus administration of intravenous contrast. CONTRAST:  126mL OMNIPAQUE IOHEXOL 300 MG/ML  SOLN COMPARISON:  CT abdomen and pelvis February 28, 2014 FINDINGS: LUNG BASES: Included view of the lung bases demonstrates small bilateral pleural effusions, patchy consolidation RIGHT middle and lower lobes included heart size is normal, mild Coronary artery calcifications. No pericardial effusions. Contrast in the distal esophagus  likely represents reflux. With air bronchograms, tree-in-bud infiltrates RIGHT middle lobe. SOLID ORGANS: The liver mild biliary dilatation pneumobilia, status  post cholecystectomy. Spleen, pancreas and adrenal glands are unremarkable. GASTROINTESTINAL TRACT: The stomach is normal in course and caliber without inflammatory changes. Mildly dilated proximal small bowel up to 3.4 cm without discrete transition point. Moderate sigmoid diverticulosis. Mild sigmoid and rectal wall thickening and edema. KIDNEYS/ URINARY TRACT: Kidneys are orthotopic, demonstrating symmetric enhancement. No nephrolithiasis, hydronephrosis or solid renal masses. Too small to characterize hypodensity lower pole RIGHT kidney. The unopacified ureters are normal in course and caliber. Delayed imaging through the kidneys demonstrates symmetric prompt contrast excretion within the proximal urinary collecting system. Urinary bladder is partially distended and unremarkable. PERITONEUM/RETROPERITONEUM: Aortoiliac vessels are normal in course and caliber, moderate to severe calcific atherosclerosis. No lymphadenopathy by CT size criteria. Internal reproductive organs are unremarkable. No intraperitoneal free fluid nor free air. SOFT TISSUE/OSSEOUS STRUCTURES: Non-suspicious. Spinal stimulator in RIGHT anterior abdominal wall, LEFT gluteal subcutaneous fat. Status post RIGHT L4-5, L5-S1 laminectomies. Severe dextroscoliosis and degenerative change of lumbar spine. IMPRESSION: Sigmoid and rectal wall thickening, could represent earlier chronic colitis in a background of diverticulosis without convincing evidence of acute diverticulitis. Mildly dilated proximal small bowel suggest ileus without bowel obstruction. Small bilateral pleural effusion. RIGHT middle and lower lobe bronchopneumonia. Electronically Signed   By: Elon Alas M.D.   On: 11/09/2014 05:37   Dg Chest Port 1 View  11/14/2014  CLINICAL DATA:  CHF, shortness of Breath EXAM:  PORTABLE CHEST 1 VIEW COMPARISON:  11/11/2014 FINDINGS: Left central line remains in place, unchanged. Bilateral lower lobe airspace opacities with layering effusions are stable. Heart is upper limits normal in size. IMPRESSION: No significant change in bilateral lower lobe airspace opacities and effusions. Electronically Signed   By: Rolm Baptise M.D.   On: 11/14/2014 12:14   Dg Chest Port 1 View  11/11/2014  CLINICAL DATA:  Acute respiratory failure EXAM: PORTABLE CHEST 1 VIEW COMPARISON:  11/09/2014 FINDINGS: Left internal jugular line tip at low SVC. Dorsal spinal stimulator. Normal heart size for level of inspiration. Small layering bilateral pleural effusions. No pneumothorax. Lower lobe predominant interstitial pulmonary edema. No change in patchy bibasilar airspace disease. IMPRESSION: No significant change since the prior exam. Interstitial edema with small bilateral pleural effusions and bibasilar airspace disease. Electronically Signed   By: Abigail Miyamoto M.D.   On: 11/11/2014 09:28   Dg Chest Portable 1 View  11/09/2014  CLINICAL DATA:  Central line placement on the left EXAM: PORTABLE CHEST 1 VIEW COMPARISON:  11/09/2014 FINDINGS: Left jugular central venous catheter with the tip projecting over the cavoatrial junction. Bilateral small pleural effusions. Bilateral lower lobe interstitial and alveolar airspace opacities. No pneumothorax. Stable cardiomediastinal silhouette. No acute osseous abnormality. Mandibular plate is noted. IMPRESSION: 1. Left jugular central venous catheter with the tip projecting over the cavoatrial junction. 2. Bilateral lower lobe interstitial and alveolar airspace opacities which may reflect mild pulmonary edema versus multilobar pneumonia. Electronically Signed   By: Kathreen Devoid   On: 11/09/2014 09:58   Dg Chest Port 1 View  11/09/2014  CLINICAL DATA:  Nausea, vomiting, and abdominal pain since 04/30 yesterday afternoon. Sepsis. EXAM: PORTABLE CHEST 1 VIEW  COMPARISON:  04/12/2014 FINDINGS: Examination is limited due to nonstandard positioning and patient rotation. Shallow inspiration. Probable linear atelectasis in the lung bases. Patchy airspace disease in both lower lungs may represent atelectasis or pneumonia. No blunting of costophrenic angles. No pneumothorax. Normal heart size and pulmonary vascularity. Mediastinal contours appear intact. Thoracolumbar scoliosis. Electrical stimulator leads in the thoracic spine. Postoperative changes in the mandible.  IMPRESSION: Shallow inspiration. Linear atelectasis in the lung bases. Airspace disease in the lower lung suggesting pneumonia or atelectasis. Electronically Signed   By: Lucienne Capers M.D.   On: 11/09/2014 04:33   Dg Abd Portable 1v  11/17/2014  CLINICAL DATA:  Abdominal pain for several days. EXAM: PORTABLE ABDOMEN - 1 VIEW COMPARISON:  CT scan 11/09/2014 FINDINGS: The bowel gas pattern is unremarkable. Scattered air and stool in the colon. No distended small bowel loops to suggest obstruction. No free air. Stable pneumobilia. Severe scoliosis, postoperative changes and spinal cord stimulators. IMPRESSION: No plain film findings for an acute abdominal process. Electronically Signed   By: Marijo Sanes M.D.   On: 11/17/2014 10:41    CBC  Recent Labs Lab 11/11/14 0422 11/12/14 0354 11/13/14 0514 11/16/14 0522 11/17/14 0350  WBC 6.8 4.7 3.3* 4.7 4.1  HGB 10.9* 11.1* 10.9* 10.9* 10.1*  HCT 33.4* 35.0* 35.0* 34.9* 32.8*  PLT 117* 115* 129* 180 174  MCV 94.9 95.9 96.7 96.7 97.3  MCH 31.0 30.4 30.1 30.2 30.0  MCHC 32.6 31.7 31.1 31.2 30.8  RDW 14.2 14.0 13.7 13.6 13.8    Chemistries   Recent Labs Lab 11/12/14 0354 11/13/14 0514 11/15/14 0413 11/16/14 0522 11/17/14 0350  NA 142 139 143 141 142  K 4.1 4.6 3.9 3.7 3.6  CL 103 99* 95* 96* 95*  CO2 28 34* 39* 37* 39*  GLUCOSE 111* 106* 102* 105* 78  BUN 8 6 8 10 14   CREATININE 0.78 0.71 0.77 0.67 0.66  CALCIUM 8.7* 8.5* 9.0 8.8*  8.8*  MG 1.9  --   --   --   --    ------------------------------------------------------------------------------------------------------------------ estimated creatinine clearance is 35.6 mL/min (by C-G formula based on Cr of 0.66). ------------------------------------------------------------------------------------------------------------------ No results for input(s): HGBA1C in the last 72 hours. ------------------------------------------------------------------------------------------------------------------ No results for input(s): CHOL, HDL, LDLCALC, TRIG, CHOLHDL, LDLDIRECT in the last 72 hours. ------------------------------------------------------------------------------------------------------------------ No results for input(s): TSH, T4TOTAL, T3FREE, THYROIDAB in the last 72 hours.  Invalid input(s): FREET3 ------------------------------------------------------------------------------------------------------------------ No results for input(s): VITAMINB12, FOLATE, FERRITIN, TIBC, IRON, RETICCTPCT in the last 72 hours.  Coagulation profile No results for input(s): INR, PROTIME in the last 168 hours.  No results for input(s): DDIMER in the last 72 hours.  Cardiac Enzymes No results for input(s): CKMB, TROPONINI, MYOGLOBIN in the last 168 hours.  Invalid input(s): CK ------------------------------------------------------------------------------------------------------------------ Invalid input(s): POCBNP    Nitin Mckowen D.O. on 11/17/2014 at 1:15 PM  Between 7am to 7pm - Pager - [phone]  After 7pm go to [email or web address] - password [email or web address]  And look for the night coverage person covering for me after hours  [facility] Office  [phone]

## 2014-11-17 NOTE — Progress Notes (Signed)
Utilization review completed.  

## 2014-11-17 NOTE — Discharge Summary (Addendum)
Physician Discharge Summary  [name] [name] ZOX:[id number] DOB: July 22, 1925 DOA: 11/09/2014  PCP: [name], NP  Admit date: 11/09/2014 Discharge date: 11/18/2014  Time spent: 45 minutes  Recommendations for Outpatient Follow-up:  Patient will be discharged to home with home health PT and OT.  Patient will need to follow up with primary care provider within one week of discharge.  Patient should continue medications as prescribed.  Patient should follow a heart healthy diet.   Discharge Diagnoses:  Septic shock secondary to aspiration pneumonia versus Escherichia coli UTI Nausea, vomiting, ileus with possible colitis Aspiration pneumonia Escherichia coli UTI Acute on chronic systolic heart failure Acute renal failure Thrombocytopenia Acute Encephalopathy  Discharge Condition: Stable  Diet recommendation: Heart healthy  Filed Weights   11/15/14 2018 11/16/14 2009 11/18/14 0414  Weight: 51.5 kg (113 lb 8.6 oz) 50.3 kg (110 lb 14.3 oz) 50.3 kg (110 lb 14.3 oz)    History of present illness:  on 11/09/2014 by Dr. Simonne Maffucci This is an 81 y/o female with COPD and diastolic heart failure and recurrent episodes of diverticulitis/colitis who came to the [facility] ED on 10/13 with nausea, vomiting, belly pain and weakness. She was in her usual state of health on 10/12 (very active, lives independently), but after going to the grocery store in the afternoon she suddenly had the onset of nausea, vomiting, and (by report) abdominal pain. Her granddaughter [name], broadcasting) provides much of the history because Ms. Vandermeer is sleepy right now. She continued to have nausea and vomiting all night long so her family brought her to the ED. In the ED she was noted to be tachycardic, hypotensive, and to have a high lactic acid. She was given IVF and antibiotics. PCCM was consulted because she remained hypotensive after 2.5L saline and she became dyspneic. On my exam this morning she  complains of mild dyspnea, no cough, and she denies abdominal pain at any point throughout this illness.  Hospital Course:  Septic shock secondary to aspiration pneumonia versus Escherichia coli UTI -Shock resolved, blood pressures have normalized and improved with IV fluids -Patient did require admission to ICU upon admission -Patient received 3 days of Zosyn, antibiotics changed to Rocephin on 11/12/2014  Nausea, vomiting, ileus with possible colitis -CT abdomen/pelvis: Sigmoid and rectal wall thickening, could represent early or chronic colitis in a background of diverticulosis, mildly dilated proximal small bowel suggests ileus -Patient able to tolerate diet -Continues to complain of band like abd pain, however, no pain with palpation. Spoke with gastroenterologist, Dr. Watt Climes, who recommended obtain CTA abd/pelvis for possible mesenteric ischemia, and outpatient follow up with Dr. Watt Climes in 2 weeks.  -CTA abd/pelvis: less than 50% narrowing celiac artery, SMA, hypoplastic IMA  Aspiration pneumonia -Continue supplemental oxygen as needed -CT abdomen and pelvis noted small bilateral pleural effusion, right middle and lower lobe bronchopneumonia -Speech therapy consulted  Escherichia coli UTI -Urine culture shows >100K colonies, pansensitive -Placed on ceftriaxone, will discharge with ceftin for additional 4 days (10 day course of antibiotics)  Acute on chronic systolic heart failure -Echocardiogram 11/14/2014 shows an EF of 40-45% -Chest x-ray shows volume overload, patient reports subjective shortness of breath -Patient was given IV Lasix 11/14/2014, urine output 2150 -Will continue small dose of Lasix given soft blood pressures, will discharge with Lasix 20mg  every other day.   -Metoprolol decreased 12.5mg  BID -Monitored intake and output, daily weights -Spoke with Dr. Sallyanne Kuster, who felt patient could follow up with her cardiologist in a few weeks and have  EF re-evaulated.   Patient's EF likely reduced due to sepsis and volume repletion.  Acute renal failure -Resolved  Thrombocytopenia -Resolved, Patient was placed on prophylactic heparin which was discontinued on 11/12/2014 -No signs of active bleeding  Acute Encephalopathy -Patient appears to be back at her baseline -Likely secondary to the above  Procedures  Echocardiogram   Consults  PCCM  Discharge Exam: Filed Vitals:   11/18/14 0949  BP: 126/60  Pulse: 60  Temp: 97.5 F (36.4 C)  Resp: 19   Exam  General: Well developed, well nourished, NAD, appears stated age  HEENT: NCAT, mucous membranes moist.   Cardiovascular: S1 S2 auscultated, no rubs, murmurs or gallops. Regular rate and rhythm.  Respiratory: Diminished breath sounds. Fine crackles  Abdomen: Soft, nontender, nondistended, + bowel sounds  Extremities: warm dry without cyanosis clubbing or edema  Neuro: AAOx3, nonfocal  Discharge Instructions      Discharge Instructions    Discharge instructions    Complete by:  As directed   Patient will be discharged to home with home health PT and OT.  Patient will need to follow up with primary care provider within one week of discharge.  Patient should continue medications as prescribed.  Patient should follow a heart healthy diet.            Medication List    STOP taking these medications        HYDROMORPHONE HCL IJ      TAKE these medications        acetaminophen 325 MG tablet  Commonly known as:  TYLENOL  Take 2 tablets (650 mg total) by mouth every 6 (six) hours as needed for mild pain (or Fever >/= 101).     albuterol 108 (90 BASE) MCG/ACT inhaler  Commonly known as:  PROVENTIL HFA;VENTOLIN HFA  Inhale 1-2 puffs into the lungs 3 (three) times daily as needed for wheezing or shortness of breath (wheezing & shortness of breath).     aspirin 325 MG tablet  Take 1 tablet (325 mg total) by mouth daily.     budesonide-formoterol 160-4.5 MCG/ACT inhaler    Commonly known as:  SYMBICORT  Inhale 2 puffs into the lungs 2 (two) times daily.     butalbital-acetaminophen-caffeine 50-325-40 MG tablet  Commonly known as:  FIORICET, ESGIC  Take 1 tablet by mouth every 8 (eight) hours as needed for headache (headache).     CALCIUM 600-D 600-400 MG-UNIT tablet  Generic drug:  Calcium Carbonate-Vitamin D  Take 1 tablet by mouth 2 (two) times daily with a meal.     cefUROXime 500 MG tablet  Commonly known as:  CEFTIN  Take 1 tablet (500 mg total) by mouth 2 (two) times daily with a meal.     Clotrimazole 1 % Oint  Take 1 application by mouth 2 (two) times daily as needed (mouth sores).     fluticasone 50 MCG/ACT nasal spray  Commonly known as:  FLONASE  Place 2 sprays into both nostrils daily.     furosemide 20 MG tablet  Commonly known as:  LASIX  Take 1 tablet (20 mg total) by mouth every other day.     gabapentin 100 MG capsule  Commonly known as:  NEURONTIN  Take 1 capsule (100 mg total) by mouth 2 (two) times daily.     guaiFENesin 600 MG 12 hr tablet  Commonly known as:  MUCINEX  Take 600 mg by mouth daily as needed.     lactulose 10 GM/15ML  solution  Commonly known as:  CHRONULAC  Take 10 g by mouth daily as needed for mild constipation (constipation). 10 ml by mouth once daily at bedtime     lactulose 10 GM/15ML solution  Commonly known as:  CHRONULAC  2 teaspoonful at bedtime as needed for constipation     metoprolol tartrate 25 MG tablet  Commonly known as:  LOPRESSOR  Take 0.5 tablets (12.5 mg total) by mouth 2 (two) times daily.     nitroGLYCERIN 0.4 MG SL tablet  Commonly known as:  NITROSTAT  Place 1 tablet (0.4 mg total) under the tongue every 5 (five) minutes as needed for chest pain.     omeprazole 40 MG capsule  Commonly known as:  PRILOSEC  Take 1 capsule (40 mg total) by mouth 2 (two) times daily.     ondansetron 4 MG tablet  Commonly known as:  ZOFRAN  Take 1 tablet (4 mg total) by mouth every 6 (six)  hours as needed for nausea.     oxyCODONE-acetaminophen 5-325 MG tablet  Commonly known as:  PERCOCET/ROXICET  Take 1 tablet by mouth every 6 (six) hours as needed for moderate pain or severe pain (pain).     polyethylene glycol packet  Commonly known as:  MIRALAX / GLYCOLAX  Take 17 g by mouth daily as needed for mild constipation or moderate constipation.     PROBIOTIC DAILY PO  Take 1 tablet by mouth daily at 12 noon.     vitamin C 500 MG tablet  Commonly known as:  ASCORBIC ACID  Take 500 mg by mouth daily.     VITAMIN E PO  Take 1 tablet by mouth daily.       Allergies  Allergen Reactions  . Codeine Diarrhea and Nausea And Vomiting    REACTION: n/v/d, HA  . Morphine Diarrhea and Nausea And Vomiting    REACTION: n/v/d, HA  . Sulfa Antibiotics Nausea Only    Sick    . Zoledronic Acid Swelling    REACTION: Severe edema   Follow-up Information    Follow up with [name], NP. Schedule an appointment as soon as possible for a visit in 1 week.   Specialty:  Internal Medicine   Why:  Hospital follow up   Contact information:   [address] [place] [address] [phone]       Follow up with [facility], MD. Schedule an appointment as soon as possible for a visit in 2 weeks.   Specialty:  Gastroenterology   Why:  Hospital follow up   Contact information:   [address] [place] [address] [phone]       Follow up with Warren Danes, MD In 2 weeks.   Specialty:  Cardiology   Contact information:   [place] [address] [place] [address] [phone]        The results of significant diagnostics from this hospitalization (including imaging, microbiology, ancillary and laboratory) are listed below for reference.    Significant Diagnostic Studies: Ct Abdomen Pelvis W Contrast  11/09/2014  CLINICAL DATA:  Nausea, vomiting and abdominal pain beginning 4:30 p.m. yesterday afternoon. History of  pan colitis, diverticulitis, cholecystectomy, hysterectomy. EXAM: CT ABDOMEN AND PELVIS WITH CONTRAST TECHNIQUE: Multidetector CT imaging of the abdomen and pelvis was performed using the standard protocol following bolus administration of intravenous contrast. CONTRAST:  174mL OMNIPAQUE IOHEXOL 300 MG/ML  SOLN COMPARISON:  CT abdomen and pelvis February 28, 2014 FINDINGS: LUNG BASES: Included view of the lung bases demonstrates small bilateral pleural effusions, patchy consolidation RIGHT middle and lower lobes included heart size is normal, mild Coronary artery calcifications. No pericardial effusions. Contrast in the distal esophagus likely represents reflux. With air bronchograms, tree-in-bud infiltrates RIGHT middle lobe. SOLID ORGANS: The liver mild biliary dilatation pneumobilia, status post cholecystectomy. Spleen, pancreas and adrenal glands are unremarkable. GASTROINTESTINAL TRACT: The stomach is normal in course and caliber without inflammatory changes. Mildly dilated proximal small bowel up to 3.4 cm without discrete transition point. Moderate sigmoid diverticulosis. Mild sigmoid and rectal wall thickening and edema. KIDNEYS/ URINARY TRACT: Kidneys are orthotopic, demonstrating symmetric enhancement. No nephrolithiasis, hydronephrosis or solid renal masses. Too small to characterize hypodensity lower pole RIGHT kidney. The unopacified ureters are normal in course and caliber. Delayed imaging through the kidneys demonstrates symmetric prompt contrast excretion within the proximal urinary collecting system. Urinary bladder is partially distended and unremarkable. PERITONEUM/RETROPERITONEUM: Aortoiliac vessels are normal in course and caliber, moderate to severe calcific atherosclerosis. No lymphadenopathy by CT size criteria. Internal reproductive organs are unremarkable. No intraperitoneal free fluid nor free air. SOFT TISSUE/OSSEOUS STRUCTURES: Non-suspicious. Spinal stimulator in RIGHT anterior abdominal  wall, LEFT gluteal subcutaneous fat. Status post RIGHT L4-5, L5-S1 laminectomies. Severe dextroscoliosis and degenerative change of lumbar spine. IMPRESSION: Sigmoid and rectal wall thickening, could represent earlier chronic colitis in a background of diverticulosis without convincing evidence of acute diverticulitis. Mildly dilated proximal small bowel suggest ileus without bowel obstruction. Small bilateral pleural effusion. RIGHT middle and lower lobe bronchopneumonia. Electronically Signed   By: Elon Alas M.D.   On: 11/09/2014 05:37   Dg Chest Port 1 View  11/14/2014  CLINICAL DATA:  CHF, shortness of Breath EXAM: PORTABLE CHEST 1 VIEW COMPARISON:  11/11/2014 FINDINGS: Left central line remains in place, unchanged. Bilateral lower lobe airspace opacities with layering effusions are stable. Heart is upper limits normal in size. IMPRESSION: No significant change in bilateral lower lobe airspace opacities and effusions. Electronically Signed   By: Rolm Baptise M.D.   On: 11/14/2014 12:14   Dg Chest Port 1 View  11/11/2014  CLINICAL DATA:  Acute respiratory failure EXAM: PORTABLE CHEST 1 VIEW COMPARISON:  11/09/2014 FINDINGS: Left internal jugular line tip at low SVC. Dorsal spinal stimulator. Normal heart size for level of inspiration. Small layering bilateral pleural effusions. No pneumothorax. Lower lobe predominant interstitial pulmonary edema. No change in patchy bibasilar airspace disease. IMPRESSION: No significant change since the prior exam. Interstitial edema with small bilateral pleural effusions and bibasilar airspace disease. Electronically Signed   By: Abigail Miyamoto M.D.   On: 11/11/2014 09:28   Dg Chest Portable 1 View  11/09/2014  CLINICAL DATA:  Central line placement on the left EXAM: PORTABLE CHEST 1 VIEW COMPARISON:  11/09/2014 FINDINGS: Left jugular central venous catheter with the tip projecting over the cavoatrial junction. Bilateral small pleural effusions. Bilateral lower  lobe interstitial and alveolar airspace opacities. No pneumothorax. Stable cardiomediastinal silhouette. No acute osseous abnormality. Mandibular plate is noted. IMPRESSION: 1. Left jugular central venous catheter with the tip projecting over the cavoatrial junction. 2. Bilateral lower lobe interstitial and alveolar airspace opacities which may reflect mild pulmonary edema versus multilobar pneumonia. Electronically Signed   By: Kathreen Devoid   On: 11/09/2014 09:58   Dg Chest Port 1 View  11/09/2014  CLINICAL DATA:  Nausea, vomiting, and abdominal pain since 04/30 yesterday afternoon. Sepsis. EXAM: PORTABLE CHEST 1 VIEW COMPARISON:  04/12/2014 FINDINGS: Examination is  limited due to nonstandard positioning and patient rotation. Shallow inspiration. Probable linear atelectasis in the lung bases. Patchy airspace disease in both lower lungs may represent atelectasis or pneumonia. No blunting of costophrenic angles. No pneumothorax. Normal heart size and pulmonary vascularity. Mediastinal contours appear intact. Thoracolumbar scoliosis. Electrical stimulator leads in the thoracic spine. Postoperative changes in the mandible. IMPRESSION: Shallow inspiration. Linear atelectasis in the lung bases. Airspace disease in the lower lung suggesting pneumonia or atelectasis. Electronically Signed   By: Lucienne Capers M.D.   On: 11/09/2014 04:33   Dg Abd Portable 1v  11/17/2014  CLINICAL DATA:  Abdominal pain for several days. EXAM: PORTABLE ABDOMEN - 1 VIEW COMPARISON:  CT scan 11/09/2014 FINDINGS: The bowel gas pattern is unremarkable. Scattered air and stool in the colon. No distended small bowel loops to suggest obstruction. No free air. Stable pneumobilia. Severe scoliosis, postoperative changes and spinal cord stimulators. IMPRESSION: No plain film findings for an acute abdominal process. Electronically Signed   By: Marijo Sanes M.D.   On: 11/17/2014 10:41   Ct Angio Abd/pel W/ And/or W/o  11/17/2014  CLINICAL  DATA:  Recurrent episodes of diverticulitis and colitis, nausea, vomiting, abdominal pain, weakness, and hypotension, question mesenteric ischemia, history CHF, COPD, pancolitis, pancreatitis, hypertension, hyperlipidemia, former smoker EXAM: CTA ABDOMEN AND PELVIS wITHOUT AND WITH CONTRAST TECHNIQUE: Multidetector CT imaging of the abdomen and pelvis was performed using the standard protocol during bolus administration of intravenous contrast. Multiplanar reconstructed images and MIPs were obtained and reviewed to evaluate the vascular anatomy. CONTRAST:  121mL OMNIPAQUE IOHEXOL 350 MG/ML SOLN IV. Oral contrast was not administered. COMPARISON:  11/09/2014 FINDINGS: Small BILATERAL pleural effusions with compressive atelectasis of the lower lobes. Extensive pneumobilia consistent with history of ERCP with sphincterotomy. Gallbladder surgically absent. Liver, spleen, pancreas, kidneys, and adrenal glands otherwise normal for technique. Duodenal diverticulum identified. Scattered atherosclerotic calcifications and tortuosity of thoracoabdominal aorta. Calcific plaques are seen at the origins of the celiac artery and SMA, both vessels demonstrating less than 50% narrowing. Hypoplastic IMA is noted. Additional plaque at bifurcation of RIGHT common iliac artery. No aneurysmal dilatation. Greater than 50% narrowing at the origins of BILATERAL single renal arteries. Coronary arterial calcifications noted. Beam hardening artifacts from RIGHT lower quadrant pump with intrathecal catheter. Additional intraspinal stimulator with generator at LEFT buttock. Bladder and ureters unremarkable. Uterus surgically absent with nonvisualization of ovaries. Stomach and bowel loops grossly unremarkable for technique. Appendix not localized. No mass, adenopathy, free fluid or free air. Diffuse osseous demineralization with rotary scoliosis of the thoracolumbar spine. Review of the MIP images confirms the above findings. IMPRESSION:  Scattered atherosclerotic changes including coronary arteries with tortuosity of thoracoabdominal aorta. Greater than 50% narrowings at the origins of BILATERAL renal arteries. Less than 50% narrowings at the origins of the celiac artery and SMA with note of a hypoplastic IMA. Pneumobilia from prior ERCP with sphincterotomy. Bibasilar effusions and atelectasis. Electronically Signed   By: Lavonia Dana M.D.   On: 11/17/2014 17:50    Microbiology: Recent Results (from the past 240 hour(s))  Blood Culture (routine x 2)     Status: None   Collection Time: 11/09/14  4:15 AM  Result Value Ref Range Status   Specimen Description BLOOD LEFT FOREARM  Final   Special Requests BOTTLES DRAWN AEROBIC AND ANAEROBIC 5CC  Final   Culture NO GROWTH 5 DAYS  Final   Report Status 11/14/2014 FINAL  Final  Blood Culture (routine x 2)     Status:  None   Collection Time: 11/09/14  4:27 AM  Result Value Ref Range Status   Specimen Description BLOOD LEFT HAND  Final   Special Requests BOTTLES DRAWN AEROBIC AND ANAEROBIC 5CC  Final   Culture NO GROWTH 5 DAYS  Final   Report Status 11/14/2014 FINAL  Final  Urine culture     Status: None   Collection Time: 11/09/14  4:38 AM  Result Value Ref Range Status   Specimen Description URINE, CATHETERIZED  Final   Special Requests NONE  Final   Culture >=100,000 COLONIES/mL ESCHERICHIA COLI  Final   Report Status 11/11/2014 FINAL  Final   Organism ID, Bacteria ESCHERICHIA COLI  Final      Susceptibility   Escherichia coli - MIC*    AMPICILLIN 4 SENSITIVE Sensitive     CEFAZOLIN <=4 SENSITIVE Sensitive     CEFTRIAXONE <=1 SENSITIVE Sensitive     CIPROFLOXACIN <=0.25 SENSITIVE Sensitive     GENTAMICIN <=1 SENSITIVE Sensitive     IMIPENEM <=0.25 SENSITIVE Sensitive     NITROFURANTOIN <=16 SENSITIVE Sensitive     TRIMETH/SULFA <=20 SENSITIVE Sensitive     AMPICILLIN/SULBACTAM 4 SENSITIVE Sensitive     PIP/TAZO <=4 SENSITIVE Sensitive     * >=100,000 COLONIES/mL  ESCHERICHIA COLI  MRSA PCR Screening     Status: None   Collection Time: 11/09/14  8:40 AM  Result Value Ref Range Status   MRSA by PCR NEGATIVE NEGATIVE Final    Comment:        The GeneXpert MRSA Assay (FDA approved for NASAL specimens only), is one component of a comprehensive MRSA colonization surveillance program. It is not intended to diagnose MRSA infection nor to guide or monitor treatment for MRSA infections.      Labs: Basic Metabolic Panel:  Recent Labs Lab 11/12/14 0354 11/13/14 0514 11/15/14 0413 11/16/14 0522 11/17/14 0350  NA 142 139 143 141 142  K 4.1 4.6 3.9 3.7 3.6  CL 103 99* 95* 96* 95*  CO2 28 34* 39* 37* 39*  GLUCOSE 111* 106* 102* 105* 78  BUN 8 6 8 10 14   CREATININE 0.78 0.71 0.77 0.67 0.66  CALCIUM 8.7* 8.5* 9.0 8.8* 8.8*  MG 1.9  --   --   --   --   PHOS 3.3  --   --   --   --    Liver Function Tests: No results for input(s): AST, ALT, ALKPHOS, BILITOT, PROT, ALBUMIN in the last 168 hours. No results for input(s): LIPASE, AMYLASE in the last 168 hours. No results for input(s): AMMONIA in the last 168 hours. CBC:  Recent Labs Lab 11/12/14 0354 11/13/14 0514 11/16/14 0522 11/17/14 0350  WBC 4.7 3.3* 4.7 4.1  HGB 11.1* 10.9* 10.9* 10.1*  HCT 35.0* 35.0* 34.9* 32.8*  MCV 95.9 96.7 96.7 97.3  PLT 115* 129* 180 174   Cardiac Enzymes: No results for input(s): CKTOTAL, CKMB, CKMBINDEX, TROPONINI in the last 168 hours. BNP: BNP (last 3 results)  Recent Labs  03/26/14 1310 03/28/14 1903 11/09/14 0920  BNP 101.3* 83.2 827.2*    ProBNP (last 3 results) No results for input(s): PROBNP in the last 8760 hours.  CBG:  Recent Labs Lab 11/11/14 1621  GLUCAP 105*       Signed:  [name] Ford  [facility] 11/18/2014, 2:17 PM

## 2014-11-17 NOTE — Progress Notes (Signed)
Physical Therapy Treatment Patient Details Name: [name] [name] MRN: [id number] DOB: 1926-01-27 Today's Date: 11/17/2014    History of Present Illness 81 y/o female with COPD admitted on 10/13 with a sepsis syndrome likely from colitis and aspiration pneumonia    PT Comments    Decreased tolerance to gait training today. States she feels tired and weak. Ambulated up to 90 feet with SpO2 93% on room air, HR 90. Reviewed therapeutic exercises pt can perform on her own to facilitate strength maintenance. Patient will continue to benefit from skilled physical therapy services to further improve independence with functional mobility.   Follow Up Recommendations  SNF;Supervision/Assistance - 24 hour     Equipment Recommendations  None recommended by PT    Recommendations for Other Services       Precautions / Restrictions Precautions Precautions: Fall Restrictions Weight Bearing Restrictions: No    Mobility  Bed Mobility Overal bed mobility: Modified Independent             General bed mobility comments: extra time  Transfers Overall transfer level: Needs assistance Equipment used: Rolling walker (2 wheeled) Transfers: Sit to/from Stand Sit to Stand: Min guard         General transfer comment: Min guard for safety. Slow to rise but stable with UEs on RW upon standing. Cues for hand placement.  Ambulation/Gait Ambulation/Gait assistance: Min guard Ambulation Distance (Feet): 90 Feet Assistive device: Rolling walker (2 wheeled) Gait Pattern/deviations: Step-through pattern;Decreased stride length;Trunk flexed Gait velocity: slow Gait velocity interpretation: <1.8 ft/sec, indicative of risk for recurrent falls General Gait Details: Intermittent cues for forward gaze and upright posture. Very slow today but with good control of RW. States she feels more fatigued today. SpO2 93% on room air with HR to 90bpm.   Stairs            Wheelchair Mobility     Modified Rankin (Stroke Patients Only)       Balance                                    Cognition Arousal/Alertness: Awake/alert Behavior During Therapy: WFL for tasks assessed/performed Overall Cognitive Status: Within Functional Limits for tasks assessed                      Exercises General Exercises - Lower Extremity Ankle Circles/Pumps: AROM;Both;10 reps;Seated Quad Sets: Strengthening;Both;10 reps;Seated Gluteal Sets: Strengthening;Both;10 reps;Seated    General Comments General comments (skin integrity, edema, etc.): At rest SpO2 98% on 1L supplemental O2 (checked on Rt thumb with good waveform).      Pertinent Vitals/Pain Pain Assessment: Faces Faces Pain Scale: Hurts little more Pain Location: abdomen Pain Descriptors / Indicators: Discomfort Pain Intervention(s): Monitored during session;Repositioned    Home Living                      Prior Function            PT Goals (current goals can now be found in the care plan section) Acute Rehab PT Goals Patient Stated Goal: go home PT Goal Formulation: With patient Time For Goal Achievement: 11/26/14 Potential to Achieve Goals: Good Progress towards PT goals: Progressing toward goals    Frequency  Min 3X/week    PT Plan Current plan remains appropriate    Co-evaluation             End of  Session   Activity Tolerance: Patient limited by fatigue Patient left: in bed;with call bell/phone within reach;with SCD's reapplied     Time: 1419-1441 PT Time Calculation (min) (ACUTE ONLY): 22 min  Charges:  $Gait Training: 8-22 mins                    G Codes:      Ellouise Newer 12/11/14, 2:59 PM Camille Bal Farrell, [place]

## 2014-11-18 MED ORDER — METOPROLOL TARTRATE 25 MG PO TABS
12.5000 mg | ORAL_TABLET | Freq: Two times a day (BID) | ORAL | Status: DC
Start: 2014-11-18 — End: 2014-12-01

## 2014-11-18 MED ORDER — CEFUROXIME AXETIL 500 MG PO TABS: 500.0000 mg | ORAL_TABLET | Freq: Two times a day (BID) | ORAL | Status: DC

## 2014-11-18 MED ORDER — FUROSEMIDE 20 MG PO TABS: 20.0000 mg | ORAL_TABLET | ORAL | Status: DC

## 2014-11-18 NOTE — Care Management Note (Addendum)
Case Management Note  Patient Details  Name: [name] [name] MRN: [id number] Date of Birth: [birth year]  Subjective/Objective:                  Septic shock   Action/Plan:  Discharge planning Expected Discharge Date:  11/18/14               Expected Discharge Plan:  [place]  In-House Referral:  Clinical Social Work  Discharge planning Services  CM Consult  Post Acute Care Choice:    Choice offered to:  Patient, Adult Children (Daughter [name] @ bedside)  DME Arranged:   ([facility] pta) DME Agency:   ([place])  HH Arranged:  Patient Refused [place] Agency:     Status of Service:  Completed, signed off  Medicare Important Message Given:  Yes-third notification given Date Medicare IM Given:    Medicare IM give by:    Date Additional Medicare IM Given:    Additional Medicare Important Message give by:     If discussed at [place] of Stay Meetings, dates discussed:    Additional Comments: 15:23 CM reached MD who states pt ambulated with PT and does not meet criteria for continuous oxygen.  CM relayed this to [facility] and gave [facility] rep, Tiffany new address with [name] as contact.  No other needs were communicated.  CM met with pt in room who is refusing any home health services.  CM asked permission to call daughter and pt agreed to me calling.  CM called  Pendry,[name] Daughter [phone]  And explained pt has orders for home health services and Ms. Mechele Dawley states her mother will be going home with granddaughter who is a Marine scientist and they do not need or wish to have any [place] services. RN is calling for a continuous home oxygen order and CM will alert [facility] to address change and new order Pt is going home with granddaughter, [name] who is an Therapist, sports.  CM spoke with [name] who also confirms no need for [facility] services.   Address is [address] [place], [place] [address] [phone].  [name] has brought a an oxygen tank for transport home.  CM called [facility] rep, tiffany to be aware of pending  continuous home oxygen order and change of address.  No other CM needs communicated.  Dellie Catholic, RN 11/18/2014, 2:14 PM

## 2014-11-18 NOTE — Discharge Instructions (Signed)

## 2014-11-20 ENCOUNTER — Telehealth: Payer: Self-pay | Admitting: Family

## 2014-11-20 NOTE — Telephone Encounter (Signed)
Please contact pt to arrange Transitional care follow up visit in 1 week.

## 2014-11-21 ENCOUNTER — Telehealth: Payer: Self-pay | Admitting: Behavioral Health

## 2014-11-21 ENCOUNTER — Encounter: Payer: Self-pay | Admitting: Adult Health

## 2014-11-21 NOTE — Telephone Encounter (Signed)
Unable to reach patient at time of TCM/Hospital Follow-up call. Patient does not have a voicemail set up. Also,attempted to reach the  patient's daughter, but did not receive an answer. Left message for patient to return call when available.

## 2014-11-21 NOTE — Telephone Encounter (Signed)
Please see telephone note on 11/21/14.

## 2014-11-22 NOTE — Telephone Encounter (Signed)
Spoke with the patient's daughter and she voiced that her mother is doing well and has really taken off since the time she discharged from the hospital. Currently the patient is under the care of the granddaughter, who's a nurse and is seeing that she continues to get better. Per the daughter, she does  not have her planner with her at this time and would like to call back later to set up a follow-up appointment.

## 2014-11-24 ENCOUNTER — Telehealth: Payer: Self-pay | Admitting: Family

## 2014-11-24 NOTE — Telephone Encounter (Signed)
Hospital f/u scheduled for 12/01/14.

## 2014-12-01 ENCOUNTER — Ambulatory Visit (HOSPITAL_BASED_OUTPATIENT_CLINIC_OR_DEPARTMENT_OTHER)
Admission: RE | Admit: 2014-12-01 | Discharge: 2014-12-01 | Disposition: A | Discharge status: Home / Self Care | Payer: Medicare Other | Source: Ambulatory Visit | Attending: Family | Admitting: Family

## 2014-12-01 ENCOUNTER — Encounter: Payer: Self-pay | Admitting: Family

## 2014-12-01 ENCOUNTER — Ambulatory Visit (INDEPENDENT_AMBULATORY_CARE_PROVIDER_SITE_OTHER): Payer: Medicare Other | Admitting: Family

## 2014-12-01 VITALS — HR 74 | Temp 98.5°F | Resp 16 | Ht 60.0 in | Wt 107.6 lb

## 2014-12-01 DIAGNOSIS — D696 Thrombocytopenia, unspecified: Secondary | ICD-10-CM | POA: Diagnosis not present

## 2014-12-01 DIAGNOSIS — J449 Chronic obstructive pulmonary disease, unspecified: Secondary | ICD-10-CM | POA: Insufficient documentation

## 2014-12-01 DIAGNOSIS — I5021 Acute systolic (congestive) heart failure: Secondary | ICD-10-CM | POA: Diagnosis not present

## 2014-12-01 DIAGNOSIS — R195 Other fecal abnormalities: Secondary | ICD-10-CM

## 2014-12-01 DIAGNOSIS — N289 Disorder of kidney and ureter, unspecified: Secondary | ICD-10-CM

## 2014-12-01 DIAGNOSIS — J90 Pleural effusion, not elsewhere classified: Secondary | ICD-10-CM | POA: Insufficient documentation

## 2014-12-01 DIAGNOSIS — Z8701 Personal history of pneumonia (recurrent): Secondary | ICD-10-CM | POA: Diagnosis not present

## 2014-12-01 LAB — CBC WITH DIFFERENTIAL/PLATELET
Basophils Absolute: 0 10*3/uL (ref 0.0–0.1)
Basophils Relative: 1 % (ref 0–1)
Eosinophils Absolute: 0.1 10*3/uL (ref 0.0–0.7)
Eosinophils Relative: 2 % (ref 0–5)
HCT: 34.4 % — ABNORMAL LOW (ref 36.0–46.0)
Hemoglobin: 11.3 g/dL — ABNORMAL LOW (ref 12.0–15.0)
Lymphocytes Relative: 25 % (ref 12–46)
Lymphs Abs: 1 10*3/uL (ref 0.7–4.0)
MCH: 30.1 pg (ref 26.0–34.0)
MCHC: 32.8 g/dL (ref 30.0–36.0)
MCV: 91.7 fL (ref 78.0–100.0)
MPV: 11.2 fL (ref 8.6–12.4)
Monocytes Absolute: 0.5 10*3/uL (ref 0.1–1.0)
Monocytes Relative: 12 % (ref 3–12)
Neutro Abs: 2.4 10*3/uL (ref 1.7–7.7)
Neutrophils Relative %: 60 % (ref 43–77)
Platelets: 178 10*3/uL (ref 150–400)
RBC: 3.75 MIL/uL — ABNORMAL LOW (ref 3.87–5.11)
RDW: 14.7 % (ref 11.5–15.5)
WBC: 4 10*3/uL (ref 4.0–10.5)

## 2014-12-01 LAB — BASIC METABOLIC PANEL
BUN: 15 mg/dL (ref 7–25)
CO2: 32 mmol/L — ABNORMAL HIGH (ref 20–31)
Calcium: 8.7 mg/dL (ref 8.6–10.4)
Chloride: 100 mmol/L (ref 98–110)
Creat: 0.79 mg/dL (ref 0.60–0.88)
Glucose, Bld: 83 mg/dL (ref 65–99)
Potassium: 4.7 mmol/L (ref 3.5–5.3)
Sodium: 139 mmol/L (ref 135–146)

## 2014-12-01 MED ORDER — METOPROLOL TARTRATE 25 MG PO TABS: 12.5000 mg | ORAL_TABLET | Freq: Two times a day (BID) | ORAL | Status: DC

## 2014-12-01 NOTE — Progress Notes (Signed)
Pre visit review using our clinic review tool, if applicable. No additional management support is needed unless otherwise documented below in the visit note. 

## 2014-12-01 NOTE — Progress Notes (Signed)
Subjective:    Patient ID: [name] [name], female    DOB: [birth year], 81 y.o.   MRN: [id number]  HPI  [name] [name] is an 81 yr old female who presents today for hospital follow up. Discharge summary is reviewed.   Pt was admitted 11/09/14 with septic shock secondary to aspiration pneumonia versus E coli UTI.  She was admitted to ICU, hydrated, treated with zosyn/rocephin.  CT showed possible colitis- 2 week follow up with GI was planned at D/C.  She was discharged home on ceftin x 4 more days to complete a 10 day course of abx. Reports sob is back at baseline.  Denies CP.  Denies nausea/vomitting.  Tolerating Po's.  Appetite is still low.  CHF-  Pt is followed by Dr. Sallyanne Kuster.   Wt Readings from Last 3 Encounters:  12/01/14 107 lb 9.6 oz (48.807 kg)  11/18/14 110 lb 14.3 oz (50.3 kg)  06/12/14 112 lb (50.803 kg)   Diarrhea-  Yesterday multiple stools yesterday.    Review of Systems Past Medical History  Diagnosis Date  . Osteoporosis   . GERD (gastroesophageal reflux disease)   . Hiatal hernia   . PUD (peptic ulcer disease)   . Cardiac arrhythmia   . Hyperlipidemia   . Hypertension   . Scoliosis deformity of spine     history of intractable back pain  . Choledocholithiasis   . Pancolitis (HCC)     Infectious vs. inflammatory  . Abdominal pain     Resolved  . Chronic anemia   . Chronic back pain   . Pancreatitis     History of  . CHF (congestive heart failure) ([facility])     Diastolic  on ECHO 9211  . COPD (chronic obstructive pulmonary disease) (HCC)     oxygen dependent at  night 2LPM  . PONV (postoperative nausea and vomiting)   . Reactive airway disease that is not asthma   . HCAP (healthcare-associated pneumonia) 03/28/2014  . Pneumonia 1930's; 2011 X 2; 02/2014  . On home oxygen therapy     "1L during the day; 2L q hs" (03/28/2014)  . Daily headache   . Osteoarthritis     "pretty much q where"   . Recurrent UTI (urinary tract infection)     "here lately" (03/28/2014)     Social History   Social History  . Marital Status: Widowed    Spouse Name: N/A  . Number of Children: 3  . Years of Education: N/A   Occupational History  .     Social History Main Topics  . Smoking status: Former Smoker -- 1.00 packs/day for 12 years    Types: Cigarettes    Quit date: 01/28/1988  . Smokeless tobacco: Never Used  . Alcohol Use: No  . Drug Use: No  . Sexual Activity: No   Other Topics Concern  . Not on file   Social History Narrative   1 grandchild at care link--Angelia   Lives with great-granddaughter    Past Surgical History  Procedure Laterality Date  . Cholecystectomy  2008  . Abdominal hysterectomy  1970s  . Spine surgery  [phone]  . Cervical laminectomy  1970s  . Spinal cord stimulator implant  ~ 2009  . Ercp w/ sphicterotomy  02/2010  . Pain pump implantation  ~ 2010    back, dilaudid and bupravacaine  . Flexible sigmoidoscopy N/A 01/24/2013    Procedure: FLEXIBLE SIGMOIDOSCOPY;  Surgeon: Missy Sabins, MD;  Location: [facility];  Service: Endoscopy;  Laterality: N/A;  . Esophagogastroduodenoscopy N/A 11/14/2013    Procedure: ESOPHAGOGASTRODUODENOSCOPY (EGD);  Surgeon: Missy Sabins, MD;  Location[facility] ENDOSCOPY;  Service: Endoscopy;  Laterality: N/A;  . Breast cyst excision Right 1960's  . Back surgery      x 5    Family History  Problem Relation Age of Onset  . Cancer Mother     uterine  . Heart disease Father   . Hypertension Other     Allergies  Allergen Reactions  . Codeine Diarrhea and Nausea And Vomiting    REACTION: n/v/d, HA  . Morphine Diarrhea and Nausea And Vomiting    REACTION: n/v/d, HA  . Sulfa Antibiotics Nausea Only    Sick    . Zoledronic Acid Swelling    REACTION: Severe edema    Current Outpatient Prescriptions on File Prior to Visit  Medication Sig Dispense Refill  . albuterol (PROVENTIL HFA;VENTOLIN HFA) 108 (90 BASE) MCG/ACT inhaler Inhale 1-2 puffs into the lungs 3 (three) times daily as  needed for wheezing or shortness of breath (wheezing & shortness of breath).     . Ascorbic Acid (VITAMIN C) 500 MG tablet Take 500 mg by mouth daily.      [name] aspirin 325 MG tablet Take 1 tablet (325 mg total) by mouth daily. 30 tablet 0  . budesonide-formoterol (SYMBICORT) 160-4.5 MCG/ACT inhaler Inhale 2 puffs into the lungs 2 (two) times daily. 1 Inhaler 0  . butalbital-acetaminophen-caffeine (FIORICET, ESGIC) 50-325-40 MG per tablet Take 1 tablet by mouth every 8 (eight) hours as needed for headache (headache).     . Calcium Carbonate-Vitamin D (CALCIUM 600-D) 600-400 MG-UNIT per tablet Take 1 tablet by mouth 2 (two) times daily with a meal.      . fluticasone (FLONASE) 50 MCG/ACT nasal spray Place 2 sprays into both nostrils daily. 16 g 6  . furosemide (LASIX) 20 MG tablet Take 1 tablet (20 mg total) by mouth every other day. 30 tablet 0  . gabapentin (NEURONTIN) 100 MG capsule Take 1 capsule (100 mg total) by mouth 2 (two) times daily. 60 capsule 3  . guaiFENesin (MUCINEX) 600 MG 12 hr tablet Take 600 mg by mouth daily as needed.    . lactulose (CHRONULAC) 10 GM/15ML solution Take 10 g by mouth daily as needed for mild constipation (constipation). 10 ml by mouth once daily at bedtime    . lactulose (CHRONULAC) 10 GM/15ML solution 2 teaspoonful at bedtime as needed for constipation 500 mL 0  . metoprolol (LOPRESSOR) 25 MG tablet Take 0.5 tablets (12.5 mg total) by mouth 2 (two) times daily. 60 tablet 0  . nitroGLYCERIN (NITROSTAT) 0.4 MG SL tablet Place 1 tablet (0.4 mg total) under the tongue every 5 (five) minutes as needed for chest pain. 100 tablet 3  . omeprazole (PRILOSEC) 40 MG capsule Take 1 capsule (40 mg total) by mouth 2 (two) times daily. 60 capsule 3  . ondansetron (ZOFRAN) 4 MG tablet Take 1 tablet (4 mg total) by mouth every 6 (six) hours as needed for nausea. 20 tablet 0  . oxyCODONE-acetaminophen (PERCOCET/ROXICET) 5-325 MG per tablet Take 1 tablet by mouth every 6 (six) hours as  needed for moderate pain or severe pain (pain). (Patient taking differently: Take 1 tablet by mouth 2 (two) times daily. ) 30 tablet 0  . polyethylene glycol (MIRALAX / GLYCOLAX) packet Take 17 g by mouth daily as needed for mild constipation or moderate constipation. 14 each 0  . Probiotic Product (PROBIOTIC DAILY  PO) Take 1 tablet by mouth daily at 12 noon.    [name] VITAMIN E PO Take 1 tablet by mouth daily.      No current facility-administered medications on file prior to visit.    Pulse 74  Temp(Src) 98.5 F (36.9 C) (Oral)  Resp 16  Ht 5' (1.524 m)  Wt 107 lb 9.6 oz (48.807 kg)  BMI 21.01 kg/m2  SpO2 94%       Objective:   Physical Exam  Constitutional: She is oriented to person, place, and time. She appears well-developed and well-nourished.  HENT:  Head: Normocephalic and atraumatic.  Cardiovascular: Normal rate, regular rhythm and normal heart sounds.   No murmur heard. Pulmonary/Chest: Effort normal and breath sounds normal. No respiratory distress. She has no wheezes.  Diminished breath sounds at the bases  Musculoskeletal: She exhibits no edema.  Neurological: She is alert and oriented to person, place, and time.  Psychiatric: She has a normal mood and affect. Her behavior is normal. Judgment and thought content normal.          Assessment & Plan:  Diarrhea- if persistent loose stools, then advised her grand daughter to collect stool samples and return for stool studies.    Thrombocytopenia- during admission- obtain follow up CBC.   Renal insufficiency- obtain follow up bmet.

## 2014-12-01 NOTE — Patient Instructions (Signed)
Complete lab work prior to leaving.  Complete chest x ray on the first floor. Hold lasix.  Weigh daily. Call Provider anytime you have any of the following symptoms:  1) 3 pound weight gain in 24 hours or 5 pounds in 1 week  2) shortness of breath, with or without a dry hacking cough  3) swelling in the hands, feet or stomach  4) if you have to sleep on extra pillows at night in order to breathe Continue metoprolol.  Please complete stool samples and return at your earliest convenience. Schedule follow up with Cardiology and keep upcoming appointment with Dr. Amada Kingfisher.

## 2014-12-03 ENCOUNTER — Encounter: Payer: Self-pay | Admitting: Family

## 2014-12-03 NOTE — Assessment & Plan Note (Signed)
Compensated. Was not on lasix prior to admission.  I don't think she necessarily needs to continue lasix.  Advised pt's granddaughter to weigh patient daily and contact me if weight gain as outlined in AVS. Also advised her to arrange follow up with cardiology.

## 2014-12-05 ENCOUNTER — Other Ambulatory Visit: Payer: Medicare Other

## 2014-12-05 DIAGNOSIS — R195 Other fecal abnormalities: Secondary | ICD-10-CM | POA: Diagnosis not present

## 2014-12-06 ENCOUNTER — Telehealth: Payer: Self-pay | Admitting: Family

## 2014-12-06 LAB — CLOSTRIDIUM DIFFICILE BY PCR: Toxigenic C. Difficile by PCR: DETECTED — CR

## 2014-12-06 MED ORDER — METRONIDAZOLE 500 MG PO TABS: 500.0000 mg | ORAL_TABLET | Freq: Three times a day (TID) | ORAL | Status: DC

## 2014-12-06 NOTE — Telephone Encounter (Signed)
Caller name: [facility] Call back number: [phone] option 3 microbiology department    Reason for call:  Calling to inform NP of critical results

## 2014-12-06 NOTE — Telephone Encounter (Signed)
Spoke with [name] [name] at [facility] and confirmed positive C.diff.  See previous phone note.

## 2014-12-06 NOTE — Telephone Encounter (Signed)
Notified pt's grand daughter and she voices understanding. She wanted to know if family should be medicated as well. Advised her per verbal from PA, Hassell Done, no pre-treatment recommended, family should wash hands frequently, clean with clorox wipes (let air dry), start taking daily probiotic and notify their doctor if symptoms start. She voices understanding. Scheduled f/u for 12/13/14 at 1:45pm.

## 2014-12-06 NOTE — Progress Notes (Signed)
This encounter was created in error - please disregard.

## 2014-12-06 NOTE — Telephone Encounter (Signed)
  Please contact pt and let her know that her stool is growing c diff. I would like her to start metronidazole tonight.  Avoid ETOH.  Follow up in 1 week in office. Call sooner if worsening diarrhea, fever, weakness.  Stay well hydrated.

## 2014-12-07 ENCOUNTER — Telehealth: Payer: Self-pay | Admitting: Family

## 2014-12-07 ENCOUNTER — Encounter: Payer: Medicare Other | Admitting: Physician Assistant

## 2014-12-07 MED ORDER — CHOLESTYRAMINE 4 G PO PACK: 4.0000 g | PACK | Freq: Two times a day (BID) | ORAL | Status: DC | PRN

## 2014-12-07 NOTE — Telephone Encounter (Signed)
Contacted number on file- this was her grand-daughter's number.  Grand-daughter gave me pt's number. Advised grand-daughter and pt to d/c imodium, ok to use questran prn. Per pt 1 loose stool today.

## 2014-12-07 NOTE — Telephone Encounter (Signed)
Caller name: Pendry,[name] Relation to pt: daughter  Call back number:[phone]   Reason for call:  Daughter states mother has been crying since she found out she had C-dif yesterday and would like a Rx for anxiety

## 2014-12-07 NOTE — Patient Outreach (Signed)
[place] [facility]) Care Management  12/07/2014  [name] [name] 08-30-1925 [id number]   Referral from [place] [facility], assigned Enzo Montgomery, RN to outreach for [place] Management services.  Thanks, [name]. [place], [place] Assistant Phone: [phone] Fax: [phone]

## 2014-12-07 NOTE — Telephone Encounter (Signed)
Daughter called back. Concerned re: pt crying. She is requesting rx for anxiety, advised pt that I would not recommend benzo because pt lives alone and this may increase fall risk. Advised her that we can consider a med for depression/anxiety and will assess her at her upcoming apt.  Daughter verbalizes understanding.

## 2014-12-09 LAB — STOOL CULTURE

## 2014-12-11 ENCOUNTER — Other Ambulatory Visit: Payer: Self-pay

## 2014-12-11 NOTE — Patient Outreach (Signed)
[place] [facility]) Care Management  12/11/2014  [name] [name] 20-Oct-1925 [id number]   Telephone Screen  Referral Date: 12/07/14 Referral Source: [facility] Referral Reason: COPD,CHF, 2 admits, 1 SNF PCP: Dr. Debbrah Alar  Outreach attempt #1 to patient. Patient reached. [facility] services reviewed and explained to patient. She reports she has all the assistance she needs with managing her care right now. Patient states that her dtr helps with her care. She also states that she has a granddaughter who is a Marine scientist and assists with her care needs. She declined [facility] services at this time.   Plan: RN CM will notify [facility] administrative assistant that patient declined services and case closed.  Enzo Montgomery, RN,BSN,CCM [place] Management Telephonic Care Management Coordinator Direct Phone: [phone] Toll Free: [phone] Fax: [phone]

## 2014-12-13 ENCOUNTER — Encounter: Payer: Self-pay | Admitting: Family

## 2014-12-13 ENCOUNTER — Ambulatory Visit (INDEPENDENT_AMBULATORY_CARE_PROVIDER_SITE_OTHER): Payer: Medicare Other | Admitting: Family

## 2014-12-13 VITALS — BP 117/53 | HR 74 | Temp 98.3°F | Resp 18 | Ht 60.0 in | Wt 108.0 lb

## 2014-12-13 DIAGNOSIS — A047 Enterocolitis due to Clostridium difficile: Secondary | ICD-10-CM | POA: Diagnosis not present

## 2014-12-13 DIAGNOSIS — A0472 Enterocolitis due to Clostridium difficile, not specified as recurrent: Secondary | ICD-10-CM

## 2014-12-13 DIAGNOSIS — I5021 Acute systolic (congestive) heart failure: Secondary | ICD-10-CM | POA: Diagnosis not present

## 2014-12-13 MED ORDER — METRONIDAZOLE 500 MG PO TABS: 500.0000 mg | ORAL_TABLET | Freq: Three times a day (TID) | ORAL | Status: AC

## 2014-12-13 NOTE — Assessment & Plan Note (Signed)
Appears euvolemic, advised pt to reschedule follow up with cardiology.

## 2014-12-13 NOTE — Patient Instructions (Addendum)
Please continue metronidazole for an additional 3 days to complete 10 days total. Call if you develop recurrent diarrhea or if stools do not continue to become more formed.   Please reschedule your cardiology appointment.

## 2014-12-13 NOTE — Progress Notes (Signed)
Pre visit review using our clinic review tool, if applicable. No additional management support is needed unless otherwise documented below in the visit note. 

## 2014-12-13 NOTE — Progress Notes (Signed)
Subjective:    Patient ID: [name] [name], female    DOB: 10/07/1925, 81 y.o.   MRN: [id number]  HPI  [name] [name] is an 81 yr old female who presents today for follow up. She was last seen on 12/01/14 for hospital follow up. At that time she reported ongoing diarrhea. Stool studies were obtained and she tested positive for C Diff colitis. She was treated with flagyl. Surprisingly her wbc was normal and her renal function and electrolytes were also normal.  She reports 4 soft stools yesterday and 1 soft stool today. Denies fever. She reports abdomen remains tender. Denies vomiting, does report occasional nausea.   CHF- she missed her follow up appointment with cardiology.  Weight has been stable.   Wt Readings from Last 3 Encounters:  12/13/14 108 lb (48.988 kg)  12/01/14 107 lb 9.6 oz (48.807 kg)  11/18/14 110 lb 14.3 oz (50.3 kg)   Last week daughter noted that patient was very tearful and mood was low. Today pt reports that she does not have a lot of energy but her overall mood is OK.  Did some quilting yesterday which was the first time she had done that in a while.    Review of Systems    see HPI  Past Medical History  Diagnosis Date  . Osteoporosis   . GERD (gastroesophageal reflux disease)   . Hiatal hernia   . PUD (peptic ulcer disease)   . Cardiac arrhythmia   . Hyperlipidemia   . Hypertension   . Scoliosis deformity of spine     history of intractable back pain  . Choledocholithiasis   . Pancolitis (HCC)     Infectious vs. inflammatory  . Abdominal pain     Resolved  . Chronic anemia   . Chronic back pain   . Pancreatitis     History of  . CHF (congestive heart failure) ([place])     Diastolic  on ECHO 0000000  . COPD (chronic obstructive pulmonary disease) (HCC)     oxygen dependent at  night 2LPM  . PONV (postoperative nausea and vomiting)   . Reactive airway disease that is not asthma   . HCAP (healthcare-associated pneumonia) 03/28/2014  . Pneumonia 1930's; 2011 X 2;  02/2014  . On home oxygen therapy     "1L during the day; 2L q hs" (03/28/2014)  . Daily headache   . Osteoarthritis     "pretty much q where"   . Recurrent UTI (urinary tract infection)     "here lately" (03/28/2014)    Social History   Social History  . Marital Status: Widowed    Spouse Name: N/A  . Number of Children: 3  . Years of Education: N/A   Occupational History  .     Social History Main Topics  . Smoking status: Former Smoker -- 1.00 packs/day for 12 years    Types: Cigarettes    Quit date: 01/28/1988  . Smokeless tobacco: Never Used  . Alcohol Use: No  . Drug Use: No  . Sexual Activity: No   Other Topics Concern  . Not on file   Social History Narrative   1 grandchild at care link--Angelia   Lives with great-granddaughter    Past Surgical History  Procedure Laterality Date  . Cholecystectomy  2008  . Abdominal hysterectomy  1970s  . Spine surgery  [phone]  . Cervical laminectomy  1970s  . Spinal cord stimulator implant  ~ 2009  . Ercp w/ sphicterotomy  02/2010  . Pain pump implantation  ~ 2010    back, dilaudid and bupravacaine  . Flexible sigmoidoscopy N/A 01/24/2013    Procedure: FLEXIBLE SIGMOIDOSCOPY;  Surgeon: Missy Sabins, MD;  Location: [facility];  Service: Endoscopy;  Laterality: N/A;  . Esophagogastroduodenoscopy N/A 11/14/2013    Procedure: ESOPHAGOGASTRODUODENOSCOPY (EGD);  Surgeon: Missy Sabins, MD;  Location: [facility] ENDOSCOPY;  Service: Endoscopy;  Laterality: N/A;  . Breast cyst excision Right 1960's  . Back surgery      x 5    Family History  Problem Relation Age of Onset  . Cancer Mother     uterine  . Heart disease Father   . Hypertension Other     Allergies  Allergen Reactions  . Codeine Diarrhea and Nausea And Vomiting    REACTION: n/v/d, HA  . Morphine Diarrhea and Nausea And Vomiting    REACTION: n/v/d, HA  . Sulfa Antibiotics Nausea Only    Sick    . Zoledronic Acid Swelling    REACTION: Severe edema     Current Outpatient Prescriptions on File Prior to Visit  Medication Sig Dispense Refill  . acetaminophen (TYLENOL) 500 MG tablet Take 500 mg by mouth 2 (two) times daily as needed for headache.    . albuterol (PROVENTIL HFA;VENTOLIN HFA) 108 (90 BASE) MCG/ACT inhaler Inhale 1-2 puffs into the lungs 3 (three) times daily as needed for wheezing or shortness of breath (wheezing & shortness of breath).     . Ascorbic Acid (VITAMIN C) 500 MG tablet Take 500 mg by mouth daily.      [name] aspirin 325 MG tablet Take 1 tablet (325 mg total) by mouth daily. 30 tablet 0  . budesonide-formoterol (SYMBICORT) 160-4.5 MCG/ACT inhaler Inhale 2 puffs into the lungs 2 (two) times daily. 1 Inhaler 0  . butalbital-acetaminophen-caffeine (FIORICET, ESGIC) 50-325-40 MG per tablet Take 1 tablet by mouth every 8 (eight) hours as needed for headache (headache).     . Calcium Carbonate-Vitamin D (CALCIUM 600-D) 600-400 MG-UNIT per tablet Take 1 tablet by mouth 2 (two) times daily with a meal.      . cholestyramine (QUESTRAN) 4 G packet Take 1 packet (4 g total) by mouth 2 (two) times daily as needed. 20 each 0  . fluticasone (FLONASE) 50 MCG/ACT nasal spray Place 2 sprays into both nostrils daily. 16 g 6  . gabapentin (NEURONTIN) 100 MG capsule Take 1 capsule (100 mg total) by mouth 2 (two) times daily. 60 capsule 3  . guaiFENesin (MUCINEX) 600 MG 12 hr tablet Take 600 mg by mouth daily as needed.    . lactulose (CHRONULAC) 10 GM/15ML solution 2 teaspoonful at bedtime as needed for constipation 500 mL 0  . metoprolol tartrate (LOPRESSOR) 25 MG tablet Take 0.5 tablets (12.5 mg total) by mouth 2 (two) times daily. 30 tablet 5  . nitroGLYCERIN (NITROSTAT) 0.4 MG SL tablet Place 1 tablet (0.4 mg total) under the tongue every 5 (five) minutes as needed for chest pain. 100 tablet 3  . omeprazole (PRILOSEC) 40 MG capsule Take 1 capsule (40 mg total) by mouth 2 (two) times daily. 60 capsule 3  . ondansetron (ZOFRAN) 4 MG tablet  Take 1 tablet (4 mg total) by mouth every 6 (six) hours as needed for nausea. 20 tablet 0  . oxyCODONE-acetaminophen (PERCOCET/ROXICET) 5-325 MG per tablet Take 1 tablet by mouth every 6 (six) hours as needed for moderate pain or severe pain (pain). (Patient taking differently: Take 1 tablet by mouth  2 (two) times daily. ) 30 tablet 0  . polyethylene glycol (MIRALAX / GLYCOLAX) packet Take 17 g by mouth daily as needed for mild constipation or moderate constipation. 14 each 0  . Probiotic Product (PROBIOTIC DAILY PO) Take 1 tablet by mouth daily at 12 noon.    [name] VITAMIN E PO Take 1 tablet by mouth daily.      No current facility-administered medications on file prior to visit.    BP 117/53 mmHg  Pulse 74  Temp(Src) 98.3 F (36.8 C) (Oral)  Resp 18  Ht 5' (1.524 m)  Wt 108 lb (48.988 kg)  BMI 21.09 kg/m2  SpO2 95%    Objective:   Physical Exam  Constitutional: She appears well-developed and well-nourished.  Cardiovascular: Normal rate, regular rhythm and normal heart sounds.   No murmur heard. Pulmonary/Chest: Effort normal and breath sounds normal. No respiratory distress. She has no wheezes.  Abdominal: Soft. She exhibits no distension. There is no tenderness. There is no rebound.  Musculoskeletal: She exhibits no edema.  Psychiatric: She has a normal mood and affect. Her behavior is normal. Judgment and thought content normal.          Assessment & Plan:  C Diff colitis- improving clinically, advised pt to complete an additional 3 days of metronidazole and call if symptoms do not continue to improve.   Mood is improved- advised pt and daughter to let me know if she has any further issues with tearfulness/depression.  I think pt was frustrated with her recent illness and really was not feeling well. She seems to be doing better.  Monitor.

## 2014-12-14 DIAGNOSIS — M5136 Other intervertebral disc degeneration, lumbar region: Secondary | ICD-10-CM | POA: Diagnosis not present

## 2014-12-14 DIAGNOSIS — G894 Chronic pain syndrome: Secondary | ICD-10-CM | POA: Diagnosis not present

## 2014-12-14 DIAGNOSIS — R51 Headache: Secondary | ICD-10-CM | POA: Diagnosis not present

## 2014-12-14 DIAGNOSIS — M5441 Lumbago with sciatica, right side: Secondary | ICD-10-CM | POA: Diagnosis not present

## 2014-12-18 DIAGNOSIS — R1013 Epigastric pain: Secondary | ICD-10-CM | POA: Diagnosis not present

## 2014-12-18 DIAGNOSIS — R932 Abnormal findings on diagnostic imaging of liver and biliary tract: Secondary | ICD-10-CM | POA: Diagnosis not present

## 2015-01-24 DIAGNOSIS — R51 Headache: Secondary | ICD-10-CM | POA: Diagnosis not present

## 2015-01-24 DIAGNOSIS — M5136 Other intervertebral disc degeneration, lumbar region: Secondary | ICD-10-CM | POA: Diagnosis not present

## 2015-01-24 DIAGNOSIS — M5441 Lumbago with sciatica, right side: Secondary | ICD-10-CM | POA: Diagnosis not present

## 2015-01-24 DIAGNOSIS — G894 Chronic pain syndrome: Secondary | ICD-10-CM | POA: Diagnosis not present

## 2015-01-24 DIAGNOSIS — G8929 Other chronic pain: Secondary | ICD-10-CM | POA: Diagnosis not present

## 2015-01-24 DIAGNOSIS — Z9689 Presence of other specified functional implants: Secondary | ICD-10-CM | POA: Diagnosis not present

## 2015-02-28 ENCOUNTER — Encounter: Payer: Self-pay | Admitting: Family

## 2015-02-28 ENCOUNTER — Ambulatory Visit: Payer: Medicare Other | Admitting: Family

## 2015-02-28 ENCOUNTER — Ambulatory Visit (INDEPENDENT_AMBULATORY_CARE_PROVIDER_SITE_OTHER): Payer: Medicare Other | Admitting: Family

## 2015-02-28 VITALS — BP 140/56 | HR 84 | Temp 98.3°F | Resp 16 | Ht 60.0 in | Wt 108.8 lb

## 2015-02-28 DIAGNOSIS — J431 Panlobular emphysema: Secondary | ICD-10-CM | POA: Diagnosis not present

## 2015-02-28 DIAGNOSIS — I5042 Chronic combined systolic (congestive) and diastolic (congestive) heart failure: Secondary | ICD-10-CM

## 2015-02-28 DIAGNOSIS — I10 Essential (primary) hypertension: Secondary | ICD-10-CM | POA: Diagnosis not present

## 2015-02-28 NOTE — Assessment & Plan Note (Signed)
Euvolemic. Off of beta blocker due to dizziness and low bp, advised pt ok to remain off. Will arrange follow up with Dr. Mare Ferrari.

## 2015-02-28 NOTE — Patient Instructions (Signed)
We will work on getting you back in with Dr.  Mare Ferrari. Follow up in 4 months. Sooner if problems/concerns.

## 2015-02-28 NOTE — Progress Notes (Signed)
Subjective:    Patient ID: [name] [name], female    DOB: 1926-01-27, 82 y.o.   MRN: [id number]  HPI  [name] [name] is an 82 yr old female who presents today for follow up.   1) HTN- She is not currently taking metoprolol.  Reports low readings at home.  BP Readings from Last 3 Encounters:  02/28/15 140/56  12/13/14 117/53  11/18/14 126/60   2) CHF- Last echo 11/14/14 noted LVEF 40-45%.  Also noted diastolic dysfunction.  Sees Dr. Mare Ferrari. Notes some DOE.   Wt Readings from Last 3 Encounters:  02/28/15 108 lb 12.8 oz (49.351 kg)  12/13/14 108 lb (48.988 kg)  12/01/14 107 lb 9.6 oz (48.807 kg)   3) COPD- maintained on symbicort. Reports that she continues Oxygen QHS. Grand-daughter reports that lips sometimes "turn blue" with ambulation.   Review of Systems See HPI  Past Medical History  Diagnosis Date  . Osteoporosis   . GERD (gastroesophageal reflux disease)   . Hiatal hernia   . PUD (peptic ulcer disease)   . Cardiac arrhythmia   . Hyperlipidemia   . Hypertension   . Scoliosis deformity of spine     history of intractable back pain  . Choledocholithiasis   . Pancolitis (HCC)     Infectious vs. inflammatory  . Abdominal pain     Resolved  . Chronic anemia   . Chronic back pain   . Pancreatitis     History of  . CHF (congestive heart failure) ([place])     Diastolic  on ECHO 0000000  . COPD (chronic obstructive pulmonary disease) (HCC)     oxygen dependent at  night 2LPM  . PONV (postoperative nausea and vomiting)   . Reactive airway disease that is not asthma   . HCAP (healthcare-associated pneumonia) 03/28/2014  . Pneumonia 1930's; 2011 X 2; 02/2014  . On home oxygen therapy     "1L during the day; 2L q hs" (03/28/2014)  . Daily headache   . Osteoarthritis     "pretty much q where"   . Recurrent UTI (urinary tract infection)     "here lately" (03/28/2014)    Social History   Social History  . Marital Status: Widowed    Spouse Name: N/A  . Number of Children: 3    . Years of Education: N/A   Occupational History  .     Social History Main Topics  . Smoking status: Former Smoker -- 1.00 packs/day for 12 years    Types: Cigarettes    Quit date: 01/28/1988  . Smokeless tobacco: Never Used  . Alcohol Use: No  . Drug Use: No  . Sexual Activity: No   Other Topics Concern  . Not on file   Social History Narrative   1 grandchild at care link--Angelia   Lives with great-granddaughter    Past Surgical History  Procedure Laterality Date  . Cholecystectomy  2008  . Abdominal hysterectomy  1970s  . Spine surgery  [phone]  . Cervical laminectomy  1970s  . Spinal cord stimulator implant  ~ 2009  . Ercp w/ sphicterotomy  02/2010  . Pain pump implantation  ~ 2010    back, dilaudid and bupravacaine  . Flexible sigmoidoscopy N/A 01/24/2013    Procedure: FLEXIBLE SIGMOIDOSCOPY;  Surgeon: Missy Sabins, MD;  Location: [facility];  Service: Endoscopy;  Laterality: N/A;  . Esophagogastroduodenoscopy N/A 11/14/2013    Procedure: ESOPHAGOGASTRODUODENOSCOPY (EGD);  Surgeon: Missy Sabins, MD;  Location: [facility];  Service: Endoscopy;  Laterality: N/A;  . Breast cyst excision Right 1960's  . Back surgery      x 5    Family History  Problem Relation Age of Onset  . Cancer Mother     uterine  . Heart disease Father   . Hypertension Other     Allergies  Allergen Reactions  . Codeine Diarrhea and Nausea And Vomiting    REACTION: n/v/d, HA  . Morphine Diarrhea and Nausea And Vomiting    REACTION: n/v/d, HA  . Sulfa Antibiotics Nausea Only    Sick    . Zoledronic Acid Swelling    REACTION: Severe edema    Current Outpatient Prescriptions on File Prior to Visit  Medication Sig Dispense Refill  . acetaminophen (TYLENOL) 500 MG tablet Take 500 mg by mouth 2 (two) times daily as needed for headache.    . Ascorbic Acid (VITAMIN C) 500 MG tablet Take 500 mg by mouth daily.      [name] aspirin 325 MG tablet Take 1 tablet (325 mg total) by  mouth daily. 30 tablet 0  . budesonide-formoterol (SYMBICORT) 160-4.5 MCG/ACT inhaler Inhale 2 puffs into the lungs 2 (two) times daily. 1 Inhaler 0  . butalbital-acetaminophen-caffeine (FIORICET, ESGIC) 50-325-40 MG per tablet Take 1 tablet by mouth every 8 (eight) hours as needed for headache (headache).     . Calcium Carbonate-Vitamin D (CALCIUM 600-D) 600-400 MG-UNIT per tablet Take 1 tablet by mouth 2 (two) times daily with a meal.      . gabapentin (NEURONTIN) 100 MG capsule Take 1 capsule (100 mg total) by mouth 2 (two) times daily. 60 capsule 3  . lactulose (CHRONULAC) 10 GM/15ML solution 2 teaspoonful at bedtime as needed for constipation 500 mL 0  . nitroGLYCERIN (NITROSTAT) 0.4 MG SL tablet Place 1 tablet (0.4 mg total) under the tongue every 5 (five) minutes as needed for chest pain. 100 tablet 3  . omeprazole (PRILOSEC) 40 MG capsule Take 1 capsule (40 mg total) by mouth 2 (two) times daily. 60 capsule 3  . oxyCODONE-acetaminophen (PERCOCET/ROXICET) 5-325 MG per tablet Take 1 tablet by mouth every 6 (six) hours as needed for moderate pain or severe pain (pain). (Patient taking differently: Take 1 tablet by mouth 2 (two) times daily. ) 30 tablet 0  . Probiotic Product (PROBIOTIC DAILY PO) Take 1 tablet by mouth daily at 12 noon.    [name] VITAMIN E PO Take 1 tablet by mouth daily.     [name] albuterol (PROVENTIL HFA;VENTOLIN HFA) 108 (90 BASE) MCG/ACT inhaler Inhale 1-2 puffs into the lungs 3 (three) times daily as needed for wheezing or shortness of breath (wheezing & shortness of breath). Reported on 02/28/2015    . fluticasone (FLONASE) 50 MCG/ACT nasal spray Place 2 sprays into both nostrils daily. (Patient not taking: Reported on 02/28/2015) 16 g 6  . metoprolol tartrate (LOPRESSOR) 25 MG tablet Take 0.5 tablets (12.5 mg total) by mouth 2 (two) times daily. (Patient not taking: Reported on 02/28/2015) 30 tablet 5  . ondansetron (ZOFRAN) 4 MG tablet Take 1 tablet (4 mg total) by mouth every 6 (six)  hours as needed for nausea. (Patient not taking: Reported on 02/28/2015) 20 tablet 0   No current facility-administered medications on file prior to visit.    BP 140/56 mmHg  Pulse 84  Temp(Src) 98.3 F (36.8 C) (Oral)  Resp 16  Ht 5' (1.524 m)  Wt 108 lb 12.8 oz (49.351 kg)  BMI 21.25 kg/m2  SpO2  94%       Objective:   Physical Exam  Constitutional: She is oriented to person, place, and time. She appears well-developed and well-nourished.  HENT:  Head: Normocephalic and atraumatic.  Cardiovascular: Normal rate, regular rhythm and normal heart sounds.   No murmur heard. Pulmonary/Chest: Effort normal and breath sounds normal. No respiratory distress. She has no wheezes.  Neurological: She is alert and oriented to person, place, and time.  Psychiatric: She has a normal mood and affect. Her behavior is normal. Judgment and thought content normal.          Assessment & Plan:

## 2015-02-28 NOTE — Progress Notes (Signed)
Pre visit review using our clinic review tool, if applicable. No additional management support is needed unless otherwise documented below in the visit note. 

## 2015-02-28 NOTE — Assessment & Plan Note (Signed)
BP acceptable for age. Remain off of beta blocker.

## 2015-02-28 NOTE — Assessment & Plan Note (Signed)
I did ambulate pt in hall.  Oxygen dropped briefly to 89% on room air.  Continue symbicort.

## 2015-03-08 DIAGNOSIS — R51 Headache: Secondary | ICD-10-CM | POA: Diagnosis not present

## 2015-03-08 DIAGNOSIS — M545 Low back pain: Secondary | ICD-10-CM | POA: Diagnosis not present

## 2015-03-08 DIAGNOSIS — G8929 Other chronic pain: Secondary | ICD-10-CM | POA: Diagnosis not present

## 2015-03-14 ENCOUNTER — Encounter: Payer: Self-pay | Admitting: Cardiology

## 2015-03-15 ENCOUNTER — Ambulatory Visit: Payer: Medicare Other | Admitting: Cardiology

## 2015-03-16 ENCOUNTER — Encounter: Payer: Self-pay | Admitting: Cardiology

## 2015-03-16 ENCOUNTER — Ambulatory Visit (INDEPENDENT_AMBULATORY_CARE_PROVIDER_SITE_OTHER): Payer: Medicare Other | Admitting: Cardiology

## 2015-03-16 VITALS — BP 149/58 | HR 88 | Ht 60.0 in | Wt 108.1 lb

## 2015-03-16 DIAGNOSIS — I279 Pulmonary heart disease, unspecified: Secondary | ICD-10-CM | POA: Diagnosis not present

## 2015-03-16 DIAGNOSIS — J438 Other emphysema: Secondary | ICD-10-CM

## 2015-03-16 DIAGNOSIS — I519 Heart disease, unspecified: Secondary | ICD-10-CM

## 2015-03-16 NOTE — Progress Notes (Signed)
Cardiology Office Note   Date:  03/16/2015   ID:  [name], DOB [birth year], MRN [id number]  PCP:  [name], NP  Cardiologist: Darlin Coco MD  Chief Complaint  Patient presents with  . scheduled office visit    post hospital      History of Present Illness: [name] [name] is a 82 y.o. female who presents for follow-up visit.  We last saw her 3 years ago in September 2014 . Patient has a history of atypical chest pain. She had a nuclear stress test on 04/05/08, which showed normal left ventricular systolic function with an ejection fraction of 70% and no evidence of reversible ischemia. There were no wall motion abnormalities. The patient does have a history of severe COPD and is followed by pulmonary. Patient also has a history of hypercholesterolemia. She has a past history of essential hypertension.  She has had multiple admissions for noncardiac issues.  She was admitted in February 2016 for sepsis and colitis.  She was admitted in March 2016 with pneumonia and she was admitted again in October 2016 with sepsis and pneumonia.  On her most recent hospitalization she underwent echocardiogram on 11/14/14 which showed a slight drop in her left ventricular ejection fraction down to 40-45%.  Previous echo had shown 55-60%.  She previously had been on nebivolol which was stopped during her last admission because of hypotension.  Her daughter states that at other times on outpatient visits she has also had problems with low blood pressure at times.  It was felt at the time of her October 2016 admission that her lower ejection fraction probably is a result of her tachycardia and her sepsis. She is now feeling well.  She is not having any chest pain or increased shortness of breath.  She does have limited exercise capacity which is a chronic condition.  She has had very mild ankle edema at times.  She has been a nonsmoker for the past 3 years, confirmed by her  daughter.   Past Medical History  Diagnosis Date  . Osteoporosis   . GERD (gastroesophageal reflux disease)   . Hiatal hernia   . PUD (peptic ulcer disease)   . Cardiac arrhythmia   . Hyperlipidemia   . Hypertension   . Scoliosis deformity of spine     history of intractable back pain  . Choledocholithiasis   . Pancolitis (HCC)     Infectious vs. inflammatory  . Abdominal pain     Resolved  . Chronic anemia   . Chronic back pain   . Pancreatitis     History of  . CHF (congestive heart failure) ([place])     Diastolic  on ECHO 0000000  . COPD (chronic obstructive pulmonary disease) (HCC)     oxygen dependent at  night 2LPM  . PONV (postoperative nausea and vomiting)   . Reactive airway disease that is not asthma   . HCAP (healthcare-associated pneumonia) 03/28/2014  . Pneumonia 1930's; 2011 X 2; 02/2014  . On home oxygen therapy     "1L during the day; 2L q hs" (03/28/2014)  . Daily headache   . Osteoarthritis     "pretty much q where"   . Recurrent UTI (urinary tract infection)     "here lately" (03/28/2014)    Past Surgical History  Procedure Laterality Date  . Cholecystectomy  2008  . Abdominal hysterectomy  1970s  . Spine surgery  [phone]  . Cervical laminectomy  1970s  . Spinal cord  stimulator implant  ~ 2009  . Ercp w/ sphicterotomy  02/2010  . Pain pump implantation  ~ 2010    back, dilaudid and bupravacaine  . Flexible sigmoidoscopy N/A 01/24/2013    Procedure: FLEXIBLE SIGMOIDOSCOPY;  Surgeon: Missy Sabins, MD;  Location: [facility];  Service: Endoscopy;  Laterality: N/A;  . Esophagogastroduodenoscopy N/A 11/14/2013    Procedure: ESOPHAGOGASTRODUODENOSCOPY (EGD);  Surgeon: Missy Sabins, MD;  Location: [facility] ENDOSCOPY;  Service: Endoscopy;  Laterality: N/A;  . Breast cyst excision Right 1960's  . Back surgery      x 5     Current Outpatient Prescriptions  Medication Sig Dispense Refill  . acetaminophen (TYLENOL) 500 MG tablet Take 500 mg by mouth 2 (two)  times daily as needed for headache.    . albuterol (PROVENTIL HFA;VENTOLIN HFA) 108 (90 BASE) MCG/ACT inhaler Inhale 1-2 puffs into the lungs 3 (three) times daily as needed for wheezing or shortness of breath (wheezing & shortness of breath). Reported on 02/28/2015    . Ascorbic Acid (VITAMIN C) 500 MG tablet Take 500 mg by mouth daily.      [name] aspirin 325 MG tablet Take 1 tablet (325 mg total) by mouth daily. 30 tablet 0  . budesonide-formoterol (SYMBICORT) 160-4.5 MCG/ACT inhaler Inhale 2 puffs into the lungs 2 (two) times daily. 1 Inhaler 0  . [START ON 03/25/2015] butalbital-acetaminophen-caffeine (FIORICET, ESGIC) 50-325-40 MG tablet Take 1 tablet by mouth 2 (two) times daily as needed for headache (max of 2 tablets/day). headaches    . Calcium Carbonate-Vitamin D (CALCIUM 600-D) 600-400 MG-UNIT per tablet Take 1 tablet by mouth 2 (two) times daily with a meal.      . gabapentin (NEURONTIN) 100 MG capsule Take 1 capsule (100 mg total) by mouth 2 (two) times daily. 60 capsule 3  . [START ON 04/16/2015] HYDROmorphone (DILAUDID) 4 MG tablet Take 4 mg by mouth 2 (two) times daily as needed. pain    . hyoscyamine (LEVSIN SL) 0.125 MG SL tablet Take 0.125 mg by mouth as needed. Abdominal pain    . lactulose (CHRONULAC) 10 GM/15ML solution 2 teaspoonful at bedtime as needed for constipation 500 mL 0  . nitroGLYCERIN (NITROSTAT) 0.4 MG SL tablet Place 1 tablet (0.4 mg total) under the tongue every 5 (five) minutes as needed for chest pain. 100 tablet 3  . omeprazole (PRILOSEC) 40 MG capsule Take 1 capsule (40 mg total) by mouth 2 (two) times daily. 60 capsule 3  . Probiotic Product (PROBIOTIC DAILY PO) Take 1 tablet by mouth daily at 12 noon.    [name] VITAMIN E PO Take 1 tablet by mouth daily.      No current facility-administered medications for this visit.    Allergies:   Codeine; Morphine; Sulfa antibiotics; and Zoledronic acid    Social History:  The patient  reports that she quit smoking about 27  years ago. Her smoking use included Cigarettes. She has a 12 pack-year smoking history. She has never used smokeless tobacco. She reports that she does not drink alcohol or use illicit drugs.   Family History:  The patient's family history includes Cancer in her mother; Heart disease in her father; Hypertension in her other.    ROS:  Please see the history of present illness.   Otherwise, review of systems are positive for none.   All other systems are reviewed and negative.    PHYSICAL EXAM: VS:  BP 149/58 mmHg  Pulse 88  Ht 5' (1.524 m)  Wt 108 lb 1.9 oz (49.043 kg)  BMI 21.12 kg/m2 , BMI Body mass index is 21.12 kg/(m^2). GEN: Well nourished, well developed, in no acute distress HEENT: normal Neck: no JVD, carotid bruits, or masses Cardiac: RRR; no murmurs, rubs, or gallops,no edema  Respiratory:  clear to auscultation bilaterally, normal work of breathing GI: soft, nontender, nondistended, + BS MS: no deformity or atrophy Skin: warm and dry, no rash Neuro:  Strength and sensation are intact Psych: euthymic mood, full affect   EKG:  EKG is ordered today. The ekg ordered today demonstrates normal sinus rhythm.  No ischemic changes.  Since the previous tracing of 11/09/14, heart rate has slowed.   Recent Labs: 11/09/2014: ALT 24; B Natriuretic Peptide 827.2* 11/12/2014: Magnesium 1.9 12/01/2014: BUN 15; Creat 0.79; Hemoglobin 11.3*; Platelets 178; Potassium 4.7; Sodium 139    Lipid Panel    Component Value Date/Time   CHOL  07/07/2009 0000    132        ATP III CLASSIFICATION:  <200     mg/dL   Desirable  200-239  mg/dL   Borderline High  >=240    mg/dL   High          TRIG 74 07/07/2009 0000   HDL 35* 07/07/2009 0000   CHOLHDL 3.8 07/07/2009 0000   VLDL 15 07/07/2009 0000   LDLCALC  07/07/2009 0000    82        Total Cholesterol/HDL:CHD Risk Coronary Heart Disease Risk Table                     Men   Women  1/2 Average Risk   3.4   3.3  Average Risk       5.0    4.4  2 X Average Risk   9.6   7.1  3 X Average Risk  23.4   11.0        Use the calculated Patient Ratio above and the CHD Risk Table to determine the patient's CHD Risk.        ATP III CLASSIFICATION (LDL):  <100     mg/dL   Optimal  100-129  mg/dL   Near or Above                    Optimal  130-159  mg/dL   Borderline  160-189  mg/dL   High  >190     mg/dL   Very High      Wt Readings from Last 3 Encounters:  03/16/15 108 lb 1.9 oz (49.043 kg)  02/28/15 108 lb 12.8 oz (49.351 kg)  12/13/14 108 lb (48.988 kg)         ASSESSMENT AND PLAN:  1.  Left ventricular systolic dysfunction with ejection fraction of 40-45% by echocardiogram 11/14/14 2.   essential hypertension 3.  History of COPD with frequent admissions for pneumonia   Current medicines are reviewed at length with the patient today.  The patient does not have concerns regarding medicines.  The following changes have been made:  no change  Labs/ tests ordered today include:   Orders Placed This Encounter  Procedures  . EKG 12-Lead  . ECHOCARDIOGRAM COMPLETE     Disposition:  At this point we will consider medical therapy for that her left ventricular systolic dysfunction if it is still present.  We will get a limited two-dimensional echocardiogram to evaluate her current left ventricular systolic function.  If it is still low,  we will consider starting losartan and possibly subsequent addition of carvedilol or bisoprolol. Following my retirement she will follow-up in 3 months with Dr. Sallyanne Kuster  Signed, Darlin Coco MD 03/16/2015 5:26 PM    [address], [place], [place]  [address] Phone: [phone]; Fax: [phone]

## 2015-03-16 NOTE — Patient Instructions (Signed)
Medication Instructions:  Your physician recommends that you continue on your current medications as directed. Please refer to the Current Medication list given to you today.  Labwork: NONE  Testing/Procedures: Your physician has requested that you have an echocardiogram. Echocardiography is a painless test that uses sound waves to create images of your heart. It provides your doctor with information about the size and shape of your heart and how well your heart's chambers and valves are working. This procedure takes approximately one hour. There are no restrictions for this procedure.  Follow-Up: Your physician recommends that you schedule a follow-up appointment in: [place]   If you need a refill on your cardiac medications before your next appointment, please call your pharmacy.

## 2015-03-23 ENCOUNTER — Telehealth: Payer: Self-pay | Admitting: Critical Care Medicine

## 2015-03-23 NOTE — Telephone Encounter (Signed)
[name] [name] [phone] calling back

## 2015-03-23 NOTE — Telephone Encounter (Signed)
lmtcb X1 for pt's granddaughter angie cox

## 2015-03-23 NOTE — Telephone Encounter (Signed)
The visit in chart from 09/2014 is for a walk test not OV. Destiny reports pt needs OV with walk test to re qualify for O2.  Called pt and LMTCB x1

## 2015-03-27 NOTE — Telephone Encounter (Signed)
Calling stating that pt had this done in sept wanting to speak to nurse about this tried to make appoint wanted to speak to nurse first, she can be reached @ same #.Hillery Hunter

## 2015-03-27 NOTE — Telephone Encounter (Signed)
Patients daughter, [name], called and said that she brought her mom to our office in September to do qualifying walk. She said that Destiny from [facility] has been calling her several times telling her that our office has not submitted an OV note and that she will need to be seen at our office in order to qualify for her O2.  Daughter is upset by this because her mom is practically bed ridden, it is very hard to get patient out of the house and she does not think that it is a good atmosphere for her mom in her fragile state to bring her to a pulmonary office right now.  She said that her mom has been on oxygen for 10 years now and she does not want anything to interfere with her getting her oxygen.  She wants to know what can be done.  Notes from walk were faxed to [facility] on 10/25/14. Sent message to [facility] at [facility] to see what can be done.

## 2015-03-27 NOTE — Telephone Encounter (Signed)
lmtcb X2 for pt's granddaughter- pt needs to be scheduled for ov with any provider/NP next available for qualifying walk.

## 2015-03-28 NOTE — Telephone Encounter (Signed)
Sharyn Lull, have you heard back from [place] on this one?

## 2015-03-29 ENCOUNTER — Other Ambulatory Visit (HOSPITAL_COMMUNITY): Payer: Medicare Other

## 2015-03-29 NOTE — Telephone Encounter (Signed)
Per [name]:  Apparently, this pt's O2 was up for recert back in September. My office did receive the walk test results from September however the pt failed the walk test and no longer qualified for portable O2. The pt also needed to have an office visit in September which did not happen.  The pt did have a qualifying ONO and we have those results on file that we can use to continue nocturnal O2.   At this point, all we are needing is an office visit note discussing use and benefit of O2. I have pulled the note from 02/28/15 with Debbrah Alar, NP and forwarded that to our requal team. They will review and determine whether or not it can be used for insurance purposes. If not, she will need to come in for a visit.   I'll get back to you and let you know what the outcome is.   ------------------ Awaiting outcome.  Will hold in my box until received.

## 2015-03-29 NOTE — Telephone Encounter (Signed)
Have not heard back from [facility] yet on this patient, will close encounter once I have heard back from [place]. Sent another message to [place] today.

## 2015-03-30 ENCOUNTER — Telehealth: Payer: Self-pay | Admitting: *Deleted

## 2015-03-30 NOTE — Telephone Encounter (Signed)
[place] think we will be able to handle this with the other providers that have seen the patient recently. If they choose not to help with this, then she will need to come in for a visit with you guys.  I'll let you know if that is needed. Thanks     Called and advised [name] Cox (pt's granddaughter) of above.  Nothing further needed.

## 2015-03-30 NOTE — Telephone Encounter (Signed)
Received request from [facility] to recertify oxygen Patient was seen 03/16/15 for first time in about 3 years.  She has COPD and is followed by pulmonary Left message for Destiny at [facility] to contact pulmonary or PCP since  Dr. Mare Ferrari is retiring effective 03/31/15

## 2015-04-03 ENCOUNTER — Telehealth: Payer: Self-pay | Admitting: *Deleted

## 2015-04-03 NOTE — Telephone Encounter (Signed)
Echo was cancelled by daughter

## 2015-04-06 ENCOUNTER — Encounter: Payer: Self-pay | Admitting: *Deleted

## 2015-04-11 ENCOUNTER — Ambulatory Visit (INDEPENDENT_AMBULATORY_CARE_PROVIDER_SITE_OTHER): Payer: Medicare Other | Admitting: Podiatry

## 2015-04-11 ENCOUNTER — Encounter: Payer: Self-pay | Admitting: Podiatry

## 2015-04-11 VITALS — BP 141/67 | HR 86 | Resp 12

## 2015-04-11 DIAGNOSIS — M2041 Other hammer toe(s) (acquired), right foot: Secondary | ICD-10-CM

## 2015-04-11 DIAGNOSIS — M2011 Hallux valgus (acquired), right foot: Secondary | ICD-10-CM

## 2015-04-11 NOTE — Patient Instructions (Signed)
Today your examination demonstrated a advanced bunion with arthritic changes in a rigid hammertoe on the right foot. If toe separation would not provide adequate relief amputation might be an option. The pulsations on the right foot were difficult to evaluate and if surgical treatment was contemplated would order a lower extremity arterial Doppler prior to recommending any surgical intervention  Hammer Toes Hammer toes is a condition in which one or more of your toes is permanently flexed. CAUSES  This happens when a muscle imbalance or abnormal bone length makes your small toes buckle. This causes the toe joint to contract and the strong cord-like bands that attach muscles to the bones (tendons) in your toes to shorten.  SIGNS AND SYMPTOMS  Common symptoms of flexible hammer toes include:   A buildup of skin cells (corns). Corns occur where boney bumps come in frequent contact with hard surfaces. For example, where your shoes press and rub.  Irritation.  Inflammation.  Pain.  Limited motion in your toes. DIAGNOSIS  Hammer toes are diagnosed through a physical exam of your toes. During the exam, your health care provider may try to reproduce your symptoms by manipulating your foot. Often, X-ray exams are done to determine the degree of deformity and to make sure that the cause is not a fracture.  TREATMENT  Hammer toes can be treated with corrective surgery. There are several types of surgical procedures that can treat hammer toes. The most common procedures include:  Arthroplasty--A portion of the joint is surgically removed and your toe is straightened. The gap fills in with fibrous tissue. This procedure helps treat pain and deformity and helps restore function.  Fusion--Cartilage between the two bones of the affected joint is taken out and the bones fuse together into one longer bone. This helps keep your toe stable and reduces pain but leaves your toe stiff, yet  straight.  Implantation--A portion of your bone is removed and replaced with an implant to restore motion.  Flexor tendon transfers--This procedure repositions the tendons that curl the toes down (flexor tendons). This may be done to release the deforming force that causes your toe to buckle. Several of these procedures require fixing your toe with a pin that is visible at the tip of your toe. The pin keeps the toe straight during healing. Your health care provider will remove the pin usually within 4-8 weeks after the procedure.    This information is not intended to replace advice given to you by your health care provider. Make sure you discuss any questions you have with your health care provider.   Document Released: 01/11/2000 Document Revised: 01/18/2013 Document Reviewed: 09/20/2012 Elsevier Interactive Patient Education 2016 [place] (Hallux Valgus) A bony bump (protrusion) on the inside of the foot, at the base of the first toe, is called a bunion (hallux valgus). A bunion causes the first toe to angle toward the other toes. SYMPTOMS   A bony bump on the inside of the foot, causing an outward turning of the first toe. It may also overlap the second toe.  Thickening of the skin (callus) over the bony bump.  Fluid buildup under the callus. Fluid may become red, tender, and swollen (inflamed) with constant irritation or pressure.  Foot pain and stiffness. CAUSES  Many causes exist, including:  Inherited from your family (genetics).  Injury (trauma) forcing the first toe into a position in which it overlaps other toes.  Bunions are also associated with wearing shoes that have a  narrow toe box (pointy shoes). RISK INCREASES WITH:  Family history of foot abnormalities, especially bunions.  Arthritis.  Narrow shoes, especially high heels. PREVENTION  Wear shoes with a wide toe box.  Avoid shoes with high heels.  Wear a small pad between the big toe and second  toe.  Maintain proper conditioning:  Foot and ankle flexibility.  Muscle strength and endurance. PROGNOSIS  With proper treatment, bunions can typically be cured. Occasionally, surgery is required.  RELATED COMPLICATIONS   Infection of the bunion.  Arthritis of the first toe.  Risks of surgery, including infection, bleeding, injury to nerves (numb toe), recurrent bunion, overcorrection (toe points inward), arthritis of the big toe, big toe pointing upward, and bone not healing. TREATMENT  Treatment first consists of stopping the activities that aggravate the pain, taking pain medicines, and icing to reduce inflammation and pain. Wear shoes with a wide toe box. Shoes can be modified by a shoe repair person to relieve pressure on the bunion, especially if you cannot find shoes with a wide enough toe box. You may also place a pad with the center cut out in your shoe, to reduce pressure on the bunion. Sometimes, an arch support (orthotic) may reduce pressure on the bunion and alleviate the symptoms. Stretching and strengthening exercises for the muscles of the foot may be useful. You may choose to wear a brace or pad at night to hold the big toe away from the second toe. If non-surgical treatments are not successful, surgery may be needed. Surgery involves removing the overgrown tissue and correcting the position of the first toe, by realigning the bones. Bunion surgery is typically performed on an outpatient basis, meaning you can go home the same day as surgery. The surgery may involve cutting the mid portion of the bone of the first toe, or just cutting and repairing (reconstructing) the ligaments and soft tissues around the first toe.  MEDICATION   If pain medicine is needed, nonsteroidal anti-inflammatory medicines, such as aspirin and ibuprofen, or other minor pain relievers, such as acetaminophen, are often recommended.  Do not take pain medicine for 7 days before surgery.  Prescription  pain relievers are usually only prescribed after surgery. Use only as directed and only as much as you need.  Ointments applied to the skin may be helpful. HEAT AND COLD  Cold treatment (icing) relieves pain and reduces inflammation. Cold treatment should be applied for 10 to 15 minutes every 2 to 3 hours for inflammation and pain and immediately after any activity that aggravates your symptoms. Use ice packs or an ice massage.  Heat treatment may be used prior to performing the stretching and strengthening activities prescribed by your caregiver, physical therapist, or athletic trainer. Use a heat pack or a warm soak. SEEK MEDICAL CARE IF:   Symptoms get worse or do not improve in 2 weeks, despite treatment.  After surgery, you develop fever, increasing pain, redness, swelling, drainage of fluids, bleeding, or increasing warmth around the surgical area.  New, unexplained symptoms develop. (Drugs used in treatment may produce side effects.)   This information is not intended to replace advice given to you by your health care provider. Make sure you discuss any questions you have with your health care provider.   Document Released: 01/13/2005 Document Revised: 04/07/2011 Document Reviewed: 04/27/2008 Elsevier Interactive Patient Education [employer].

## 2015-04-11 NOTE — Progress Notes (Signed)
   Subjective:    Patient ID: [name] [name], female    DOB: 1925/03/21, 82 y.o.   MRN: [id number]  HPI    This patient presents today with her daughter present in the treatment room complaining of approximately year history of extremely painful second right toe with walking wearing shoes. She describes a variety of protective sleeves applied directly to the second toe or separating the second toe from the right great toe. She has had evaluation by another doctor and describes some occasional debridement of associated keratoses on the second toe. This treatment provides patient some relief, however, patient apparently is quite active and still continues to complain of discomfort in or around the second right toe. Patient's granddaughter would like to know what other treatment options are available.   Review of Systems  Gastrointestinal: Positive for abdominal pain.  Genitourinary: Positive for urgency and frequency.  Musculoskeletal: Positive for myalgias, back pain and joint swelling.  Hematological: Bruises/bleeds easily.       Objective:   Physical Exam  Patient appears responsive to questioning, however her granddaughter does a sister and answering questions  Vascular: DP pulses 0/4 right 1/5 left PT pulses 0/4 right and 2/4 left Capillary reflex delayed bilaterally  Neurological: Sensation to 10 g monofilament wire intact 5/5 bilaterally Vibratory sensation reactive bilaterally Ankle reflex equal and reactive bilaterally  Dermatological: No open skin lesions bilaterally Small reactive keratoses second right toe Small reactive keratoses borders of right hallux and second right toe Atrophic skin without hair growth bilaterally  Musculoskeletal: HAV bilaterally Varus rotation of lateral toes 2-4 right Rigid hammertoe second right compressing against the HAV right  Restricted range of motion rather diffusely in the MPJs bilaterally      Assessment & Plan:    Assessment: Absent pedal pulses right Protective sensation intact bilaterally 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 I made them aware that the pulses on the right foot were difficult to palpate, however, patient denies any history of any open lesions. If any surgical treatment such as amputation was contemplated a preoperative arterial Doppler with ABIs and TBI's would be indicated. Also, I made patient and granddaughter aware that if the second right toe was amputated the varus medial transverse positioning of toes 3-5 still most likely would have some compression against the hallux. Patient and patient's granddaughter said that they would not be interested in this treatment.  An additional silicone toe wedge to insert between the right hallux and second right toe was inserted and patient placed shoe on found some relief.

## 2015-04-19 DIAGNOSIS — H2589 Other age-related cataract: Secondary | ICD-10-CM | POA: Diagnosis not present

## 2015-04-23 DIAGNOSIS — G894 Chronic pain syndrome: Secondary | ICD-10-CM | POA: Diagnosis not present

## 2015-04-23 DIAGNOSIS — G8929 Other chronic pain: Secondary | ICD-10-CM | POA: Diagnosis not present

## 2015-04-23 DIAGNOSIS — R51 Headache: Secondary | ICD-10-CM | POA: Diagnosis not present

## 2015-04-23 DIAGNOSIS — M545 Low back pain: Secondary | ICD-10-CM | POA: Diagnosis not present

## 2015-04-23 DIAGNOSIS — M5136 Other intervertebral disc degeneration, lumbar region: Secondary | ICD-10-CM | POA: Diagnosis not present

## 2015-05-10 ENCOUNTER — Ambulatory Visit: Payer: Medicare Other | Admitting: Adult Health

## 2015-05-17 ENCOUNTER — Other Ambulatory Visit (INDEPENDENT_AMBULATORY_CARE_PROVIDER_SITE_OTHER): Payer: Medicare Other

## 2015-05-17 ENCOUNTER — Encounter: Payer: Self-pay | Admitting: Adult Health

## 2015-05-17 ENCOUNTER — Ambulatory Visit (HOSPITAL_BASED_OUTPATIENT_CLINIC_OR_DEPARTMENT_OTHER)
Admission: RE | Admit: 2015-05-17 | Discharge: 2015-05-17 | Disposition: A | Discharge status: Home / Self Care | Payer: Medicare Other | Source: Ambulatory Visit | Attending: Adult Health | Admitting: Adult Health

## 2015-05-17 ENCOUNTER — Ambulatory Visit (INDEPENDENT_AMBULATORY_CARE_PROVIDER_SITE_OTHER): Payer: Medicare Other | Admitting: Adult Health

## 2015-05-17 VITALS — BP 124/70 | HR 96 | Ht 64.0 in | Wt 107.0 lb

## 2015-05-17 DIAGNOSIS — J431 Panlobular emphysema: Secondary | ICD-10-CM

## 2015-05-17 DIAGNOSIS — J9611 Chronic respiratory failure with hypoxia: Secondary | ICD-10-CM

## 2015-05-17 DIAGNOSIS — J9610 Chronic respiratory failure, unspecified whether with hypoxia or hypercapnia: Secondary | ICD-10-CM | POA: Insufficient documentation

## 2015-05-17 DIAGNOSIS — R0602 Shortness of breath: Secondary | ICD-10-CM

## 2015-05-17 DIAGNOSIS — J449 Chronic obstructive pulmonary disease, unspecified: Secondary | ICD-10-CM | POA: Diagnosis present

## 2015-05-17 DIAGNOSIS — R0902 Hypoxemia: Secondary | ICD-10-CM | POA: Diagnosis not present

## 2015-05-17 MED ORDER — FLUTICASONE-SALMETEROL 250-50 MCG/DOSE IN AEPB: 1.0000 | INHALATION_SPRAY | Freq: Two times a day (BID) | RESPIRATORY_TRACT | Status: DC

## 2015-05-17 NOTE — Addendum Note (Signed)
Addended by: Mathis Dad on: 05/17/2015 12:28 PM   Modules accepted: Orders

## 2015-05-17 NOTE — Assessment & Plan Note (Addendum)
Pt does have ambulatory desats with underlying COPD and CHF  She can cont on O2 At bedtime   Add O2 2l/ with activity  POC order to DME for evaluation  Check cxr and labs today

## 2015-05-17 NOTE — Progress Notes (Signed)
Subjective:    Patient ID: [name] [name], female    DOB: 13-Sep-1925, 82 y.o.   MRN: [id number]  HPI 82 yo female former smoker with Moderate Stage II COPD with emphysema Has systolic  CHF  Former Dr. Joya Gaskins  Pt.   TEST  Spirometry 2011 showed FEV1 74%, ratio 69, ++BD response with  Post BD FEV1 90%, ratio 74.    05/17/2015 Follow up: COPD GOLD C  Pt returns for a 1 year  follow up . Accompanied by her daughters  She says she is more dyspneic , more dry coughing and hoarseness and sinus drainage. For last 2 weeks with all the pollen.  She was seen in PCP and O2 did decrease to upper 80s on RA .  Remains on Symbicort Twice daily  -but admits not taking on consistent basis. Says she has a hard time using the inhaler . Does not take when she feels good. Also not covered by insurance. Has used samples only over last couple of years.  She denies chest pain, fever, orthopnea, edema , discolored mucus , hemoptysis or calf pain.   After walk into office today , O2 sats were noted at 82% on RA.  Says she is on Oxygen 2l/m At bedtime  . Over last year she will use during daytime when she has a flare.  Needs qualification for DME to keep O2 during daytime.   Last admitted 10/2014 with sepsis with aspiration PNA vs e coli UTI. Complicated by decompensated CHF . She was treated abx and diuresis . Echo showed decreased EF 40-45%  She was also admitted in March 2016 with sepsis and PNA. follow up CXR showed decreased small bilateral effusions.  PVX and Prevnar are utd.  Family says she has never gotten back to her baseline since hospital admit in Oct 2016.    Past Medical History  Diagnosis Date  . Osteoporosis   . GERD (gastroesophageal reflux disease)   . Hiatal hernia   . PUD (peptic ulcer disease)   . Cardiac arrhythmia   . Hyperlipidemia   . Hypertension   . Scoliosis deformity of spine     history of intractable back pain  . Choledocholithiasis   . Pancolitis (HCC)     Infectious  vs. inflammatory  . Abdominal pain     Resolved  . Chronic anemia   . Chronic back pain   . Pancreatitis     History of  . CHF (congestive heart failure) ([place])     Diastolic  on ECHO 0000000  . COPD (chronic obstructive pulmonary disease) (HCC)     oxygen dependent at  night 2LPM  . PONV (postoperative nausea and vomiting)   . Reactive airway disease that is not asthma   . HCAP (healthcare-associated pneumonia) 03/28/2014  . Pneumonia 1930's; 2011 X 2; 02/2014  . On home oxygen therapy     "1L during the day; 2L q hs" (03/28/2014)  . Daily headache   . Osteoarthritis     "pretty much q where"   . Recurrent UTI (urinary tract infection)     "here lately" (03/28/2014)   Current Outpatient Prescriptions on File Prior to Visit  Medication Sig Dispense Refill  . acetaminophen (TYLENOL) 500 MG tablet Take 500 mg by mouth 2 (two) times daily as needed for headache.    . albuterol (PROVENTIL HFA;VENTOLIN HFA) 108 (90 BASE) MCG/ACT inhaler Inhale 1-2 puffs into the lungs 3 (three) times daily as needed for wheezing or shortness of  breath (wheezing & shortness of breath). Reported on 02/28/2015    . Ascorbic Acid (VITAMIN C) 500 MG tablet Take 500 mg by mouth daily.      [name] aspirin 325 MG tablet Take 1 tablet (325 mg total) by mouth daily. 30 tablet 0  . Calcium Carbonate-Vitamin D (CALCIUM 600-D) 600-400 MG-UNIT per tablet Take 1 tablet by mouth 2 (two) times daily with a meal.      . gabapentin (NEURONTIN) 100 MG capsule Take 1 capsule (100 mg total) by mouth 2 (two) times daily. 60 capsule 3  . hyoscyamine (LEVSIN SL) 0.125 MG SL tablet Take 0.125 mg by mouth as needed. Abdominal pain    . lactulose (CHRONULAC) 10 GM/15ML solution 2 teaspoonful at bedtime as needed for constipation 500 mL 0  . nitroGLYCERIN (NITROSTAT) 0.4 MG SL tablet Place 1 tablet (0.4 mg total) under the tongue every 5 (five) minutes as needed for chest pain. 100 tablet 3  . omeprazole (PRILOSEC) 40 MG capsule Take 1 capsule (40 mg  total) by mouth 2 (two) times daily. 60 capsule 3  . Probiotic Product (PROBIOTIC DAILY PO) Take 1 tablet by mouth daily at 12 noon.     No current facility-administered medications on file prior to visit.     Review of Systems Constitutional:   No  weight loss, night sweats,  Fevers, chills,  +fatigue, or  lassitude.  HEENT:   No headaches,  Difficulty swallowing,  Tooth/dental problems, or  Sore throat,                No sneezing, itching, ear ache,  +nasal congestion, post nasal drip,   CV:  No chest pain,  Orthopnea, PND, swelling in lower extremities, anasarca, dizziness, palpitations, syncope.   GI  No heartburn, indigestion, abdominal pain, nausea, vomiting, diarrhea, change in bowel habits, loss of appetite, bloody stools.   Resp:   No chest wall deformity  Skin: no rash or lesions.  GU: no dysuria, change in color of urine, no urgency or frequency.  No flank pain, no hematuria   MS:  No joint pain or swelling.  No decreased range of motion.  No back pain.  Psych:  No change in mood or affect. No depression or anxiety.  No memory loss.        Objective:   Physical Exam Filed Vitals:   05/17/15 1140  BP: 124/70  Pulse: 96     GEN: A/Ox3; pleasant , NAD, frail and elderly , walks with cane.   HEENT:  [place]/AT,  EACs-clear, TMs-wnl, NOSE-clear, THROAT-clear, no lesions, no postnasal drip or exudate noted.   NECK:  Supple w/ fair ROM; no JVD; normal carotid impulses w/o bruits; no thyromegaly or nodules palpated; no lymphadenopathy.  RESP  Clear  P & A; w/o, wheezes/ rales/ or rhonchi.no accessory muscle use, no dullness to percussion  CARD:  RRR, no m/r/g  , no peripheral edema, pulses intact, no cyanosis or clubbing.  GI:   Soft & nt; nml bowel sounds; no organomegaly or masses detected.  Musco: Warm bil, no deformities or joint swelling noted.   Neuro: alert, no focal deficits noted.    Skin: Warm, no lesions or rashes  Tammy Parrett NP-C  [facility]  05/17/2015        Assessment & Plan:

## 2015-05-17 NOTE — Patient Instructions (Addendum)
Begin Advair 250/73mcg 1 puff Twice daily  , rinse after use.  Use Oxygen 2l/m .  Order to DME for POC evaluation.  Chest xray and labs today .  Follow up with Dr. Elsworth Soho  In 3 weeks and As needed   Please contact office for sooner follow up if symptoms do not improve or worsen or seek emergency care

## 2015-05-17 NOTE — Assessment & Plan Note (Signed)
Not well controlled , not using inhaler as intended.  Check cxr today  Restart ICS/LABA combo, see if this will help  Hold on steroids /abx as no wheezing or productive cough   Plan Begin Advair 250/76mcg 1 puff Twice daily  , rinse after use.  Use Oxygen 2l/m .  Order to DME for POC evaluation.  Chest xray today .  Follow up with Dr. Elsworth Soho  In 3 weeks and As needed   Please contact office for sooner follow up if symptoms do not improve or worsen or seek emergency care

## 2015-05-17 NOTE — Progress Notes (Signed)
Reviewed & agree with plan  

## 2015-05-18 ENCOUNTER — Telehealth: Payer: Self-pay | Admitting: Adult Health

## 2015-05-18 DIAGNOSIS — J431 Panlobular emphysema: Secondary | ICD-10-CM

## 2015-05-18 LAB — BASIC METABOLIC PANEL
BUN: 17 mg/dL (ref 7–25)
CO2: 33 mmol/L — ABNORMAL HIGH (ref 20–31)
Calcium: 8.4 mg/dL — ABNORMAL LOW (ref 8.6–10.4)
Chloride: 103 mmol/L (ref 98–110)
Creat: 0.83 mg/dL (ref 0.60–0.88)
Glucose, Bld: 54 mg/dL — ABNORMAL LOW (ref 65–99)
Potassium: 5 mmol/L (ref 3.5–5.3)
Sodium: 143 mmol/L (ref 135–146)

## 2015-05-18 LAB — BRAIN NATRIURETIC PEPTIDE: Brain Natriuretic Peptide: 66 pg/mL (ref <100)

## 2015-05-18 NOTE — Telephone Encounter (Signed)
Patient's daughter calling to check status of POC Eval order. She is requesting a tank for patient to carry so she can take patient out of the house.  Patient has concentrator at home that she uses for night time, advised the daughter that she can continue to use the tank we loaned her until they come out for the POC evaluation to get her a POC.  Advised her that if we order the large tanks for her, then the insurance will not cover the POC as insurance will only cover one of the other.  Advised her that I would check status of order with [name] at [facility] and will call her back.  Called and left message on voicemail for [name] to call back. Awaiting call back from [facility].

## 2015-05-18 NOTE — Progress Notes (Signed)
Quick Note:  Called spoke with pt. Reviewed results and recs. Pt voiced understanding and had no further questions. ______ 

## 2015-05-19 LAB — CBC WITH DIFFERENTIAL/PLATELET
Basophils Absolute: 0 cells/uL (ref 0–200)
Basophils Relative: 0 %
Eosinophils Absolute: 70 cells/uL (ref 15–500)
Eosinophils Relative: 2 %
HCT: 38.6 % (ref 35.0–45.0)
Hemoglobin: 12.5 g/dL (ref 11.7–15.5)
Lymphocytes Relative: 20 %
Lymphs Abs: 700 cells/uL — ABNORMAL LOW (ref 850–3900)
MCH: 31 pg (ref 27.0–33.0)
MCHC: 32.4 g/dL (ref 32.0–36.0)
MCV: 95.8 fL (ref 80.0–100.0)
MPV: 11.2 fL (ref 7.5–12.5)
Monocytes Absolute: 280 cells/uL (ref 200–950)
Monocytes Relative: 8 %
Neutro Abs: 2450 cells/uL (ref 1500–7800)
Neutrophils Relative %: 70 %
Platelets: 151 10*3/uL (ref 140–400)
RBC: 4.03 MIL/uL (ref 3.80–5.10)
RDW: 14.8 % (ref 11.0–15.0)
WBC: 3.5 10*3/uL — ABNORMAL LOW (ref 3.8–10.8)

## 2015-05-21 NOTE — Telephone Encounter (Signed)
[name] [name] cb [phone]

## 2015-05-21 NOTE — Addendum Note (Signed)
Addended by: Len Blalock on: 05/21/2015 11:09 AM   Modules accepted: Orders

## 2015-05-21 NOTE — Telephone Encounter (Signed)
Spoke with [name] at [facility], states that [facility] has received an order for poc evaluation, but pt's current 02 rx with [facility] states she only uses nocturnal 02.  [facility] needs a new order.   Order placed for daytime 02 in addition to pt's current nocturnal 02 rx.   Nothing further needed.

## 2015-05-21 NOTE — Telephone Encounter (Signed)
lmtcb x2 for [name] [name] with [facility] 

## 2015-05-21 NOTE — Progress Notes (Signed)
Quick Note:  Called and spoke with pt's grand-daughter. Reviewed results and recs. Pt's grand-daughter voiced understanding and had no further questions. ______

## 2015-06-06 DIAGNOSIS — M545 Low back pain: Secondary | ICD-10-CM | POA: Diagnosis not present

## 2015-06-06 DIAGNOSIS — G8929 Other chronic pain: Secondary | ICD-10-CM | POA: Diagnosis not present

## 2015-06-06 DIAGNOSIS — G894 Chronic pain syndrome: Secondary | ICD-10-CM | POA: Diagnosis not present

## 2015-06-06 DIAGNOSIS — Z9689 Presence of other specified functional implants: Secondary | ICD-10-CM | POA: Diagnosis not present

## 2015-06-06 DIAGNOSIS — M5136 Other intervertebral disc degeneration, lumbar region: Secondary | ICD-10-CM | POA: Diagnosis not present

## 2015-06-06 DIAGNOSIS — R51 Headache: Secondary | ICD-10-CM | POA: Diagnosis not present

## 2015-06-20 ENCOUNTER — Encounter: Payer: Self-pay | Admitting: Cardiovascular Disease

## 2015-06-20 ENCOUNTER — Ambulatory Visit (INDEPENDENT_AMBULATORY_CARE_PROVIDER_SITE_OTHER): Payer: Medicare Other | Admitting: Cardiovascular Disease

## 2015-06-20 VITALS — BP 122/70 | HR 86 | Ht 60.0 in | Wt 110.2 lb

## 2015-06-20 DIAGNOSIS — I279 Pulmonary heart disease, unspecified: Secondary | ICD-10-CM

## 2015-06-20 DIAGNOSIS — I10 Essential (primary) hypertension: Secondary | ICD-10-CM

## 2015-06-20 DIAGNOSIS — I5042 Chronic combined systolic (congestive) and diastolic (congestive) heart failure: Secondary | ICD-10-CM | POA: Diagnosis not present

## 2015-06-20 NOTE — Progress Notes (Signed)
Patient ID: [name] [name], female   DOB: 26-Jul-1925, 82 y.o.   MRN: [id number]    Cardiology Office Note    Date:  06/21/2015   ID:  Miyeko Caufield, DOB 24-Nov-1925, MRN [id number]  PCP:  [name], NP  Cardiologist:   Sanda Klein, MD   Chief Complaint  Patient presents with  . Follow-up    3 months--prior Dr. Mare Ferrari  pt c/o occasioanl chest discomfort, feels her heart beating hard and becomes SOB; occasional dizziness when she moves too quickly    History of Present Illness:  [name] [name] is a 82 y.o. female with long-standing problems with diastolic heart failure, here to establish follow-up after Dr. Mare Ferrari retired.  She has long-standing COPD and uses oxygen at night and with physical activity. She is estimated to be Gold stage C and used to see Dr. Asencion Noble, with plans to follow-up with Dr. Lake Bells in the pulmonary clinic. She was hospitalized with pneumonia and sepsis last October (the second episode of pneumonia in 1 year) and had problems with fluid overload at that time, but has not required hospitalization or adjustment in diuretic dose since then. She has not been able to return to her previous baseline since that hospitalization.  She denies any recent change in her pattern of functional class III exertional dyspnea. She has not been troubled by edema recently. She denies exertional chest discomfort or any symptoms at rest. She does not have palpitations or syncope. She has not recently had problems with cough or wheezing, fever or chills. She has not had any bleeding issues.  She has a history of atypical chest pain. She had a nuclear stress test on 04/05/08, which showed normal left ventricular systolic function with an ejection fraction of 70% and no evidence of reversible ischemia or  wall motion abnormalities. During the admission with sepsis her echocardiogram showed an ejection fraction of 40-45 %, but in the past her echo had shown normal left  ventricular ejection fraction.  Past Medical History  Diagnosis Date  . Osteoporosis   . GERD (gastroesophageal reflux disease)   . Hiatal hernia   . PUD (peptic ulcer disease)   . Cardiac arrhythmia   . Hyperlipidemia   . Hypertension   . Scoliosis deformity of spine     history of intractable back pain  . Choledocholithiasis   . Pancolitis (HCC)     Infectious vs. inflammatory  . Abdominal pain     Resolved  . Chronic anemia   . Chronic back pain   . Pancreatitis     History of  . CHF (congestive heart failure) ([place])     Diastolic  on ECHO 0000000  . COPD (chronic obstructive pulmonary disease) (HCC)     oxygen dependent at  night 2LPM  . PONV (postoperative nausea and vomiting)   . Reactive airway disease that is not asthma   . HCAP (healthcare-associated pneumonia) 03/28/2014  . Pneumonia 1930's; 2011 X 2; 02/2014  . On home oxygen therapy     "1L during the day; 2L q hs" (03/28/2014)  . Daily headache   . Osteoarthritis     "pretty much q where"   . Recurrent UTI (urinary tract infection)     "here lately" (03/28/2014)    Past Surgical History  Procedure Laterality Date  . Cholecystectomy  2008  . Abdominal hysterectomy  1970s  . Spine surgery  [phone]  . Cervical laminectomy  1970s  . Spinal cord stimulator implant  ~ 2009  .  Ercp w/ sphicterotomy  02/2010  . Pain pump implantation  ~ 2010    back, dilaudid and bupravacaine  . Flexible sigmoidoscopy N/A 01/24/2013    Procedure: FLEXIBLE SIGMOIDOSCOPY;  Surgeon: Missy Sabins, MD;  Location: [place];  Service: Endoscopy;  Laterality: N/A;  . Esophagogastroduodenoscopy N/A 11/14/2013    Procedure: ESOPHAGOGASTRODUODENOSCOPY (EGD);  Surgeon: Missy Sabins, MD;  Location: [facility] ENDOSCOPY;  Service: Endoscopy;  Laterality: N/A;  . Breast cyst excision Right 1960's  . Back surgery      x 5    Current Medications: Outpatient Prescriptions Prior to Visit  Medication Sig Dispense Refill  . acetaminophen  (TYLENOL) 500 MG tablet Take 500 mg by mouth 2 (two) times daily as needed for headache.    . albuterol (PROVENTIL HFA;VENTOLIN HFA) 108 (90 BASE) MCG/ACT inhaler Inhale 1-2 puffs into the lungs 3 (three) times daily as needed for wheezing or shortness of breath (wheezing & shortness of breath). Reported on 02/28/2015    . Ascorbic Acid (VITAMIN C) 500 MG tablet Take 500 mg by mouth daily.      [name] aspirin 325 MG tablet Take 1 tablet (325 mg total) by mouth daily. 30 tablet 0  . Calcium Carbonate-Vitamin D (CALCIUM 600-D) 600-400 MG-UNIT per tablet Take 1 tablet by mouth 2 (two) times daily with a meal.      . Fluticasone-Salmeterol (ADVAIR DISKUS) 250-50 MCG/DOSE AEPB Inhale 1 puff into the lungs 2 (two) times daily. 1 each 6  . gabapentin (NEURONTIN) 100 MG capsule Take 1 capsule (100 mg total) by mouth 2 (two) times daily. 60 capsule 3  . hyoscyamine (LEVSIN SL) 0.125 MG SL tablet Take 0.125 mg by mouth as needed. Abdominal pain    . lactulose (CHRONULAC) 10 GM/15ML solution 2 teaspoonful at bedtime as needed for constipation 500 mL 0  . nitroGLYCERIN (NITROSTAT) 0.4 MG SL tablet Place 1 tablet (0.4 mg total) under the tongue every 5 (five) minutes as needed for chest pain. 100 tablet 3  . omeprazole (PRILOSEC) 40 MG capsule Take 1 capsule (40 mg total) by mouth 2 (two) times daily. 60 capsule 3  . Probiotic Product (PROBIOTIC DAILY PO) Take 1 tablet by mouth daily at 12 noon.     No facility-administered medications prior to visit.     Allergies:   Codeine; Morphine; Sulfa antibiotics; and Zoledronic acid   Social History   Social History  . Marital Status: Widowed    Spouse Name: N/A  . Number of Children: 3  . Years of Education: N/A   Occupational History  .     Social History Main Topics  . Smoking status: Former Smoker -- 1.00 packs/day for 12 years    Types: Cigarettes    Quit date: 01/28/1988  . Smokeless tobacco: Never Used  . Alcohol Use: No  . Drug Use: No  . Sexual  Activity: No   Other Topics Concern  . None   Social History Narrative   1 grandchild at care link--Angelia   Lives with great-granddaughter     Family History:  The patient's family history includes Cancer in her mother; Heart disease in her father; Hypertension in her other.   ROS:   Please see the history of present illness.    ROS All other systems reviewed and are negative.   PHYSICAL EXAM:   VS:  BP 122/70 mmHg  Pulse 86  Ht 5' (1.524 m)  Wt 49.986 kg (110 lb 3.2 oz)  BMI 21.52 kg/m2  GEN: Well nourished, well developed, in no acute distress HEENT: normal Neck: no JVD, carotid bruits, or masses Cardiac: Distant heart sounds, but RRR; no murmurs, rubs, or gallops,no edema  Respiratory: Diminished breath sounds throughout, but otherwise clear to auscultation bilaterally, normal work of breathing GI: soft, nontender, nondistended, + BS MS: no deformity or atrophy Skin: warm and dry, no rash Neuro:  Alert and Oriented x 3, Strength and sensation are intact Psych: euthymic mood, full affect  Wt Readings from Last 3 Encounters:  06/20/15 49.986 kg (110 lb 3.2 oz)  05/17/15 48.535 kg (107 lb)  03/16/15 49.043 kg (108 lb 1.9 oz)      Studies/Labs Reviewed:   EKG:  EKG is ordered today.  The ekg ordered today demonstrates Sinus rhythm, normal tracing, QTC 406 ms  Recent Labs: 11/09/2014: ALT 24 11/12/2014: Magnesium 1.9 05/17/2015: Brain Natriuretic Peptide 66.0; BUN 17; Creat 0.83; Hemoglobin 12.5; Platelets 151; Potassium 5.0; Sodium 143   Lipid Panel    Component Value Date/Time   CHOL  07/07/2009 0000    132        ATP III CLASSIFICATION:  <200     mg/dL   Desirable  200-239  mg/dL   Borderline High  >=240    mg/dL   High          TRIG 74 07/07/2009 0000   HDL 35* 07/07/2009 0000   CHOLHDL 3.8 07/07/2009 0000   VLDL 15 07/07/2009 0000   LDLCALC  07/07/2009 0000    82        Total Cholesterol/HDL:CHD Risk Coronary Heart Disease Risk Table                      Men   Women  1/2 Average Risk   3.4   3.3  Average Risk       5.0   4.4  2 X Average Risk   9.6   7.1  3 X Average Risk  23.4   11.0        Use the calculated Patient Ratio above and the CHD Risk Table to determine the patient's CHD Risk.        ATP III CLASSIFICATION (LDL):  <100     mg/dL   Optimal  100-129  mg/dL   Near or Above                    Optimal  130-159  mg/dL   Borderline  160-189  mg/dL   High  >190     mg/dL   Very High    Additional studies/ records that were reviewed today include:  Notes from Dr. Mare Ferrari, pulmonary clinic, most recent hospital visit, echocardiogram images.    ASSESSMENT:    1. Chronic combined systolic and diastolic congestive heart failure ([place])   2. Chronic pulmonary heart disease ([place])   3. Essential hypertension      PLAN:  In order of problems listed above:  1. CHF: She appears to be clinically euvolemic without the need for loop diuretics. Her shortness of breath is attributable to COPD rather than heart failure. Her last ejection fraction during acute illness was 40-45%, but this may have been a transient dysfunction. I have recommended that she undergo repeat echocardiogram. If her ejection fraction is indeed depressed we might need to consider starting treatment with RAAS inhibitors. She probably would not tolerate a blocker as well. I reviewed the images of a CT angiogram of her chest  performed in 2016. There is substantial calcification in the coronary arteries, but I'm not sure how this would compared to a typical 82 year old. 2. COPD/cor pulmonaleShe has evidence of chronic cor pulmonale by previous evaluation. He has fairly advanced COPD and requires oxygen intermittently at home. 3. HTN: Not sure about this diagnosis. She has normal blood pressure today without taking any antihypertensives.  If her echocardiogram indeed shows normalization of left ventricular systolic function, plan to follow-up in one year.  Otherwise will bring her back to the office sooner to discuss further evaluation and management of left ventricular dysfunction.   Medication Adjustments/Labs and Tests Ordered: Current medicines are reviewed at length with the patient today.  Concerns regarding medicines are outlined above.  Medication changes, Labs and Tests ordered today are listed in the Patient Instructions below. Patient Instructions  Dr Sallyanne Kuster recommends that you continue on your current medications as directed. Please refer to the Current Medication list given to you today.  Your physician has requested that you have an echocardiogram. Echocardiography is a painless test that uses sound waves to create images of your heart. It provides your doctor with information about the size and shape of your heart and how well your heart's chambers and valves are working. This procedure takes approximately one hour. There are no restrictions for this procedure. This will be done at our [facility] location - [address], [address].  Dr Sallyanne Kuster recommends that you schedule a follow-up appointment in 1 year. You will receive a reminder letter in the mail two months in advance. If you don't receive a letter, please call our office to schedule the follow-up appointment.  If you need a refill on your cardiac medications before your next appointment, please call your pharmacy.    Signed, Sanda Klein, MD  06/21/2015 6:14 PM    [facility] [place], [place], [place]  [address] Phone: [phone]; Fax: [phone]

## 2015-06-20 NOTE — Patient Instructions (Signed)
Dr Sallyanne Kuster recommends that you continue on your current medications as directed. Please refer to the Current Medication list given to you today.  Your physician has requested that you have an echocardiogram. Echocardiography is a painless test that uses sound waves to create images of your heart. It provides your doctor with information about the size and shape of your heart and how well your heart's chambers and valves are working. This procedure takes approximately one hour. There are no restrictions for this procedure. This will be done at our [facility] location - [address].  Dr Sallyanne Kuster recommends that you schedule a follow-up appointment in 1 year. You will receive a reminder letter in the mail two months in advance. If you don't receive a letter, please call our office to schedule the follow-up appointment.  If you need a refill on your cardiac medications before your next appointment, please call your pharmacy.

## 2015-06-29 ENCOUNTER — Ambulatory Visit (INDEPENDENT_AMBULATORY_CARE_PROVIDER_SITE_OTHER): Payer: Medicare Other | Admitting: Family

## 2015-06-29 ENCOUNTER — Ambulatory Visit (HOSPITAL_BASED_OUTPATIENT_CLINIC_OR_DEPARTMENT_OTHER)
Admission: RE | Admit: 2015-06-29 | Discharge: 2015-06-29 | Disposition: A | Discharge status: Home / Self Care | Payer: Medicare Other | Source: Ambulatory Visit | Attending: Family | Admitting: Family

## 2015-06-29 ENCOUNTER — Encounter: Payer: Self-pay | Admitting: Family

## 2015-06-29 ENCOUNTER — Other Ambulatory Visit: Payer: Self-pay | Admitting: Family

## 2015-06-29 VITALS — BP 129/61 | HR 79 | Temp 97.9°F | Resp 18 | Ht 60.0 in | Wt 110.8 lb

## 2015-06-29 DIAGNOSIS — I10 Essential (primary) hypertension: Secondary | ICD-10-CM | POA: Diagnosis not present

## 2015-06-29 DIAGNOSIS — R918 Other nonspecific abnormal finding of lung field: Secondary | ICD-10-CM | POA: Diagnosis not present

## 2015-06-29 DIAGNOSIS — M810 Age-related osteoporosis without current pathological fracture: Secondary | ICD-10-CM | POA: Insufficient documentation

## 2015-06-29 DIAGNOSIS — Z23 Encounter for immunization: Secondary | ICD-10-CM | POA: Diagnosis not present

## 2015-06-29 DIAGNOSIS — Z87891 Personal history of nicotine dependence: Secondary | ICD-10-CM | POA: Insufficient documentation

## 2015-06-29 DIAGNOSIS — J431 Panlobular emphysema: Secondary | ICD-10-CM

## 2015-06-29 DIAGNOSIS — E2839 Other primary ovarian failure: Secondary | ICD-10-CM | POA: Diagnosis not present

## 2015-06-29 DIAGNOSIS — R5382 Chronic fatigue, unspecified: Secondary | ICD-10-CM | POA: Diagnosis not present

## 2015-06-29 DIAGNOSIS — Z780 Asymptomatic menopausal state: Secondary | ICD-10-CM | POA: Diagnosis not present

## 2015-06-29 LAB — CBC WITH DIFFERENTIAL/PLATELET
Basophils Absolute: 0 10*3/uL (ref 0.0–0.1)
Basophils Relative: 0.4 % (ref 0.0–3.0)
Eosinophils Absolute: 0.1 10*3/uL (ref 0.0–0.7)
Eosinophils Relative: 2 % (ref 0.0–5.0)
HCT: 36 % (ref 36.0–46.0)
Hemoglobin: 11.9 g/dL — ABNORMAL LOW (ref 12.0–15.0)
Lymphocytes Relative: 23 % (ref 12.0–46.0)
Lymphs Abs: 0.9 10*3/uL (ref 0.7–4.0)
MCHC: 33.1 g/dL (ref 30.0–36.0)
MCV: 93.4 fl (ref 78.0–100.0)
Monocytes Absolute: 0.3 10*3/uL (ref 0.1–1.0)
Monocytes Relative: 7.4 % (ref 3.0–12.0)
Neutro Abs: 2.7 10*3/uL (ref 1.4–7.7)
Neutrophils Relative %: 67.2 % (ref 43.0–77.0)
Platelets: 163 10*3/uL (ref 150.0–400.0)
RBC: 3.86 Mil/uL — ABNORMAL LOW (ref 3.87–5.11)
RDW: 14.4 % (ref 11.5–15.5)
WBC: 4.1 10*3/uL (ref 4.0–10.5)

## 2015-06-29 LAB — BASIC METABOLIC PANEL
BUN: 20 mg/dL (ref 6–23)
CO2: 32 mEq/L (ref 19–32)
Calcium: 9 mg/dL (ref 8.4–10.5)
Chloride: 103 mEq/L (ref 96–112)
Creatinine, Ser: 0.8 mg/dL (ref 0.40–1.20)
GFR: 71.93 mL/min (ref >60.00)
Glucose, Bld: 87 mg/dL (ref 70–99)
Potassium: 4.7 mEq/L (ref 3.5–5.1)
Sodium: 142 mEq/L (ref 135–145)

## 2015-06-29 LAB — TSH: TSH: 0.89 u[IU]/mL (ref 0.35–4.50)

## 2015-06-29 NOTE — Assessment & Plan Note (Signed)
BP is stable.  Monitor off meds.

## 2015-06-29 NOTE — Telephone Encounter (Signed)
Please contact pt and let her know that her cxr looks good, no pneumonia or extra fluid in lungs. Bone density shows severe osteoporosis.  I would like her to start fosamax once weekly. Continue calcium + D supplement.

## 2015-06-29 NOTE — Telephone Encounter (Signed)
Left message for pt's daughter to return my call

## 2015-06-29 NOTE — Patient Instructions (Addendum)
Please complete lab work prior to leaving.  Complete chest x ray on the first floor.  Please call to schedule a follow up with Dr. Elsworth Soho- [phone]

## 2015-06-29 NOTE — Assessment & Plan Note (Signed)
Obtain cxr to further evaluate due to note of bibasilar crackles on exam.  Need to rule out volume overload, PNA

## 2015-06-29 NOTE — Progress Notes (Signed)
Subjective:    Patient ID: [name] [name], female    DOB: Apr 06, 1925, 82 y.o.   MRN: [id number]  HPI  [name] [name] is an 82 yr old female who presents today for follow up.  1) HTN- not currently on BP meds.   BP Readings from Last 3 Encounters:  06/29/15 129/61  06/20/15 122/70  05/17/15 124/70   2) COPD- on advair, albuterol prn.  Reports breathing is "not so good." uses albuterol several times a day with some improvement.   3) Fatigue- reports "tired all the time."  Reports that she has felt tired x 1 month.  Hgb at that visit was 12.5. Sugar was low at 54.She uses oxygen at night and prn durning the day.       Review of Systems    see HPI  Past Medical History  Diagnosis Date  . Osteoporosis   . GERD (gastroesophageal reflux disease)   . Hiatal hernia   . PUD (peptic ulcer disease)   . Cardiac arrhythmia   . Hyperlipidemia   . Hypertension   . Scoliosis deformity of spine     history of intractable back pain  . Choledocholithiasis   . Pancolitis (HCC)     Infectious vs. inflammatory  . Abdominal pain     Resolved  . Chronic anemia   . Chronic back pain   . Pancreatitis     History of  . CHF (congestive heart failure) ([place])     Diastolic  on ECHO 0000000  . COPD (chronic obstructive pulmonary disease) (HCC)     oxygen dependent at  night 2LPM  . PONV (postoperative nausea and vomiting)   . Reactive airway disease that is not asthma   . HCAP (healthcare-associated pneumonia) 03/28/2014  . Pneumonia 1930's; 2011 X 2; 02/2014  . On home oxygen therapy     "1L during the day; 2L q hs" (03/28/2014)  . Daily headache   . Osteoarthritis     "pretty much q where"   . Recurrent UTI (urinary tract infection)     "here lately" (03/28/2014)     Social History   Social History  . Marital Status: Widowed    Spouse Name: N/A  . Number of Children: 3  . Years of Education: N/A   Occupational History  .     Social History Main Topics  . Smoking status: Former Smoker --  1.00 packs/day for 12 years    Types: Cigarettes    Quit date: 01/28/1988  . Smokeless tobacco: Never Used  . Alcohol Use: No  . Drug Use: No  . Sexual Activity: No   Other Topics Concern  . Not on file   Social History Narrative   1 grandchild at care link--Angelia   Lives with great-granddaughter    Past Surgical History  Procedure Laterality Date  . Cholecystectomy  2008  . Abdominal hysterectomy  1970s  . Spine surgery  [phone]  . Cervical laminectomy  1970s  . Spinal cord stimulator implant  ~ 2009  . Ercp w/ sphicterotomy  02/2010  . Pain pump implantation  ~ 2010    back, dilaudid and bupravacaine  . Flexible sigmoidoscopy N/A 01/24/2013    Procedure: FLEXIBLE SIGMOIDOSCOPY;  Surgeon: Missy Sabins, MD;  Location: [facility];  Service: Endoscopy;  Laterality: N/A;  . Esophagogastroduodenoscopy N/A 11/14/2013    Procedure: ESOPHAGOGASTRODUODENOSCOPY (EGD);  Surgeon: Missy Sabins, MD;  Location: [facility] ENDOSCOPY;  Service: Endoscopy;  Laterality: N/A;  . Breast cyst  excision Right 1960's  . Back surgery      x 5    Family History  Problem Relation Age of Onset  . Cancer Mother     uterine  . Heart disease Father   . Hypertension Other     Allergies  Allergen Reactions  . Codeine Diarrhea and Nausea And Vomiting    REACTION: n/v/d, HA  . Morphine Diarrhea and Nausea And Vomiting    REACTION: n/v/d, HA  . Sulfa Antibiotics Nausea Only    Sick    . Zoledronic Acid Swelling    REACTION: Severe edema    Current Outpatient Prescriptions on File Prior to Visit  Medication Sig Dispense Refill  . acetaminophen (TYLENOL) 500 MG tablet Take 500 mg by mouth 2 (two) times daily as needed for headache.    . albuterol (PROVENTIL HFA;VENTOLIN HFA) 108 (90 BASE) MCG/ACT inhaler Inhale 1-2 puffs into the lungs 3 (three) times daily as needed for wheezing or shortness of breath (wheezing & shortness of breath). Reported on 02/28/2015    . Ascorbic Acid (VITAMIN C)  500 MG tablet Take 500 mg by mouth daily.      [name] aspirin 325 MG tablet Take 1 tablet (325 mg total) by mouth daily. 30 tablet 0  . Calcium Carbonate-Vitamin D (CALCIUM 600-D) 600-400 MG-UNIT per tablet Take 1 tablet by mouth 2 (two) times daily with a meal.      . diclofenac sodium (VOLTAREN) 1 % GEL Place 1 application onto the skin 2 (two) times daily as needed.    . Fluticasone-Salmeterol (ADVAIR DISKUS) 250-50 MCG/DOSE AEPB Inhale 1 puff into the lungs 2 (two) times daily. 1 each 6  . gabapentin (NEURONTIN) 100 MG capsule Take 1 capsule (100 mg total) by mouth 2 (two) times daily. 60 capsule 3  . hyoscyamine (LEVSIN SL) 0.125 MG SL tablet Take 0.125 mg by mouth as needed. Abdominal pain    . lactulose (CHRONULAC) 10 GM/15ML solution 2 teaspoonful at bedtime as needed for constipation 500 mL 0  . nitroGLYCERIN (NITROSTAT) 0.4 MG SL tablet Place 1 tablet (0.4 mg total) under the tongue every 5 (five) minutes as needed for chest pain. 100 tablet 3  . omeprazole (PRILOSEC) 40 MG capsule Take 1 capsule (40 mg total) by mouth 2 (two) times daily. 60 capsule 3  . oxyCODONE-acetaminophen (PERCOCET) 7.5-325 MG tablet Take 1 tablet by mouth 2 (two) times daily.    . Probiotic Product (PROBIOTIC DAILY PO) Take 1 tablet by mouth daily at 12 noon.    [name] VITAMIN E PO Take 1 tablet by mouth daily.     No current facility-administered medications on file prior to visit.    BP 129/61 mmHg  Pulse 79  Temp(Src) 97.9 F (36.6 C) (Oral)  Resp 18  Ht 5' (1.524 m)  Wt 110 lb 12.8 oz (50.259 kg)  BMI 21.64 kg/m2  SpO2 94%    Objective:   Physical Exam  Constitutional: She is oriented to person, place, and time. She appears well-developed and well-nourished.  HENT:  Head: Normocephalic and atraumatic.  Cardiovascular: Normal rate, regular rhythm and normal heart sounds.   No murmur heard. Pulmonary/Chest: Effort normal. No respiratory distress. She has no wheezes. She has rales in the right lower field  and the left lower field.  Musculoskeletal: She exhibits no edema.  Neurological: She is alert and oriented to person, place, and time.  Skin: Skin is warm and dry.  Psychiatric: She has a normal mood and  affect. Her behavior is normal. Judgment and thought content normal.          Assessment & Plan:  Fatigue- will obtain follow up CBC, bmet. Will also check TSH.    Td today.

## 2015-06-29 NOTE — Progress Notes (Signed)
Pre visit review using our clinic review tool, if applicable. No additional management support is needed unless otherwise documented below in the visit note. 

## 2015-07-01 ENCOUNTER — Encounter: Payer: Self-pay | Admitting: Family

## 2015-07-01 MED ORDER — MULTI-VITAMIN/MINERALS PO TABS: 1.0000 | ORAL_TABLET | Freq: Every day | ORAL | Status: DC

## 2015-07-02 ENCOUNTER — Telehealth: Payer: Self-pay | Admitting: Family

## 2015-07-02 NOTE — Telephone Encounter (Signed)
Caller [name] - daughter Can be reached: [phone]   Reason for call: Daughter [name] said she would like call from [place] to f/u on conversation from this morning.

## 2015-07-02 NOTE — Telephone Encounter (Signed)
Pt has listed allergy to bisphosphonates so we will not send fosamax Rx. Offered Prolia per verbal from PCP and pt's daughter states that pt had such a bad reaction to the reclast and since this injection is a 6 month injection she is concerned about potential reaction and feels the risk would be greater than the benefit of medication. Rx not sent.

## 2015-07-02 NOTE — Telephone Encounter (Signed)
noted 

## 2015-07-02 NOTE — Telephone Encounter (Signed)
Pt's daughter called back wanting to know if there was a medication that pt could take for osteoporosis that did not last 6 months like prolia. Advised her no other options available other than the otc calcium / vitamin D that pt is already taking since she had edema with the Reclast and all oral Rxs are in the same class as reclast. Please advise if any further recommendations?

## 2015-07-03 NOTE — Telephone Encounter (Signed)
Notified pt's daughter. She states she will look into this option and let us know how they want to proceed.

## 2015-07-03 NOTE — Telephone Encounter (Signed)
forteo is a possibility. This is an injectable drug which is in a different class than fosamax and prolia.  I don't typically prescribe this but if they would consider, I will refer her to endocrinology.

## 2015-07-06 ENCOUNTER — Telehealth: Payer: Self-pay | Admitting: Family

## 2015-07-06 DIAGNOSIS — M810 Age-related osteoporosis without current pathological fracture: Secondary | ICD-10-CM

## 2015-07-06 NOTE — Telephone Encounter (Signed)
Pt states that her daughter is a critical care nurse and she will monitor pt if decision is to continue Fosamax

## 2015-07-06 NOTE — Telephone Encounter (Signed)
Pt's daughter called back stating they think pt took fosamax in the past and can't remember why she stopped it. States they would like to try that again. Advised her that due to zoledronic acid allergy we would not be able to prescribe fosamax as it is in the same class of drug. Pt's daughter concerned and states they don't really want to do any injections and would still like to retry fosamax. Please advise?

## 2015-07-06 NOTE — Telephone Encounter (Signed)
Notified pt's daughter. She wanted to know about calcitonin nasal spray. Per verbal from PCP, has not been shown as effective as other treatments and is not prescribed by PCP. Advises pt to see endocrinologist if pt is not comfortable trying options already recommended by PCP. Pt's daughter would like to proceed with endocrinology referral.

## 2015-07-06 NOTE — Telephone Encounter (Signed)
Pt's daughter called in to speak with CMA. She would like a call back in reference to mom. She says that CMA has been trying to find a good medication for moms bones. She would like to discuss further.   Antonieta [id number]

## 2015-07-06 NOTE — Telephone Encounter (Signed)
I am not comfortable prescribing fosamax even under observation due to previous reaction to Reclast.  I am happy to refer her to endocrinology if they wish and they can further discuss her treatment with them.

## 2015-07-09 ENCOUNTER — Ambulatory Visit (HOSPITAL_COMMUNITY): Payer: Medicare Other | Attending: Cardiovascular Disease

## 2015-07-09 ENCOUNTER — Other Ambulatory Visit: Payer: Self-pay

## 2015-07-09 DIAGNOSIS — E785 Hyperlipidemia, unspecified: Secondary | ICD-10-CM | POA: Diagnosis not present

## 2015-07-09 DIAGNOSIS — J449 Chronic obstructive pulmonary disease, unspecified: Secondary | ICD-10-CM | POA: Diagnosis not present

## 2015-07-09 DIAGNOSIS — Z8249 Family history of ischemic heart disease and other diseases of the circulatory system: Secondary | ICD-10-CM | POA: Insufficient documentation

## 2015-07-09 DIAGNOSIS — I5042 Chronic combined systolic (congestive) and diastolic (congestive) heart failure: Secondary | ICD-10-CM

## 2015-07-09 DIAGNOSIS — I110 Hypertensive heart disease with heart failure: Secondary | ICD-10-CM | POA: Insufficient documentation

## 2015-07-09 DIAGNOSIS — Z87891 Personal history of nicotine dependence: Secondary | ICD-10-CM | POA: Diagnosis not present

## 2015-07-09 DIAGNOSIS — I252 Old myocardial infarction: Secondary | ICD-10-CM | POA: Insufficient documentation

## 2015-07-09 DIAGNOSIS — I059 Rheumatic mitral valve disease, unspecified: Secondary | ICD-10-CM | POA: Diagnosis not present

## 2015-07-12 ENCOUNTER — Ambulatory Visit (INDEPENDENT_AMBULATORY_CARE_PROVIDER_SITE_OTHER): Payer: Medicare Other | Admitting: Pulmonary Disease

## 2015-07-12 ENCOUNTER — Encounter: Payer: Self-pay | Admitting: Pulmonary Disease

## 2015-07-12 VITALS — BP 132/74 | HR 87 | Ht 60.0 in | Wt 111.0 lb

## 2015-07-12 DIAGNOSIS — J439 Emphysema, unspecified: Secondary | ICD-10-CM

## 2015-07-12 DIAGNOSIS — J9611 Chronic respiratory failure with hypoxia: Secondary | ICD-10-CM | POA: Diagnosis not present

## 2015-07-12 NOTE — Assessment & Plan Note (Signed)
Continue oxygen-reassess need in 3 months Lung function seems okay

## 2015-07-12 NOTE — Assessment & Plan Note (Signed)
Alternative to Advair and Symbicort is [facility]- call back for prescription once find out which one is covered by insurance  Use albuterol as needed

## 2015-07-12 NOTE — Progress Notes (Signed)
   Subjective:    Patient ID: [name] [name], female    DOB: December 23, 1925, 82 y.o.   MRN: [id number]  HPI  82 yo female former smoker with Moderate Stage II COPD with emphysema Has systolic  CHF  Former Dr. Joya Gaskins  Pt.   07/12/2015  Chief Complaint  Patient presents with  . Follow-up    breathing is doing well, some tightness in chest off and on.  no other concerns.   Accompanied by her daughter [name] On her last visit 04/2015, she was noted to have low oxygen saturations started on oxygen while portable concentrator. This has helped her, improve her dyspnea She is independent and lives alone and is able to carry out all activities of daily living She is compliant with her nocturnal oxygen during sleep-and uses daytime oxygen on an as-needed basis  She uses Advair with a spacer-she also has Symbicort and is confused as to which one to use-both of these medications were very expensive and cost her $200  Last admitted 10/2014 with sepsis with aspiration PNA vs e coli UTI. Complicated by decompensated CHF . She was treated abx and diuresis . Echo showed decreased EF 40-45%    Significant tests/ events   Spirometry 2011 showed FEV1 74%, ratio 69, ++BD response with  Post BD FEV1 90%, ratio 74.     Review of Systems neg for any significant sore throat, dysphagia, itching, sneezing, nasal congestion or excess/ purulent secretions, fever, chills, sweats, unintended wt loss, pleuritic or exertional cp, hempoptysis, orthopnea pnd or change in chronic leg swelling.  Also denies presyncope, palpitations, heartburn, abdominal pain, nausea, vomiting, diarrhea or change in bowel or urinary habits, dysuria,hematuria, rash, arthralgias, visual complaints, headache, numbness weakness or ataxia.     Objective:   Physical Exam  Gen. Pleasant, thin, in no distress ENT - no lesions, no post nasal drip Neck: No JVD, no thyromegaly, no carotid bruits Lungs: no use of accessory muscles, no dullness  to percussion, decreased without rales or rhonchi  Cardiovascular: Rhythm regular, tachy, heart sounds  normal, no murmurs or gallops, no peripheral edema Musculoskeletal: No deformities, no cyanosis or clubbing        Assessment & Plan:

## 2015-07-12 NOTE — Patient Instructions (Signed)
Alternative to Advair and Symbicort is [facility]- call back for prescription once find out which one is covered by insurance  Use albuterol as needed

## 2015-07-19 DIAGNOSIS — M545 Low back pain: Secondary | ICD-10-CM | POA: Diagnosis not present

## 2015-07-19 DIAGNOSIS — G894 Chronic pain syndrome: Secondary | ICD-10-CM | POA: Diagnosis not present

## 2015-07-19 DIAGNOSIS — Z9689 Presence of other specified functional implants: Secondary | ICD-10-CM | POA: Diagnosis not present

## 2015-07-19 DIAGNOSIS — G8929 Other chronic pain: Secondary | ICD-10-CM | POA: Diagnosis not present

## 2015-07-20 DIAGNOSIS — R1013 Epigastric pain: Secondary | ICD-10-CM | POA: Diagnosis not present

## 2015-07-23 DIAGNOSIS — M545 Low back pain: Secondary | ICD-10-CM | POA: Diagnosis not present

## 2015-07-23 DIAGNOSIS — M47817 Spondylosis without myelopathy or radiculopathy, lumbosacral region: Secondary | ICD-10-CM | POA: Diagnosis not present

## 2015-07-23 DIAGNOSIS — M4120 Other idiopathic scoliosis, site unspecified: Secondary | ICD-10-CM | POA: Diagnosis not present

## 2015-07-25 ENCOUNTER — Other Ambulatory Visit (HOSPITAL_BASED_OUTPATIENT_CLINIC_OR_DEPARTMENT_OTHER): Payer: Self-pay | Admitting: Orthopaedic Surgery

## 2015-07-25 ENCOUNTER — Telehealth: Payer: Self-pay | Admitting: Family

## 2015-07-25 DIAGNOSIS — M545 Low back pain: Secondary | ICD-10-CM

## 2015-07-25 NOTE — Telephone Encounter (Signed)
Caller name:Pendry,[name] Relation to [phone]  Call back number:[phone] Pharmacy:  Reason for call:  Daughter would like to discuss bone loss. Please advise

## 2015-07-25 NOTE — Telephone Encounter (Signed)
Spoke with pt's daughter. She states pt recently saw Dr Durward Fortes (ortho) and he told her that pt would need to see the doctor that ordered her DEXA for treatment of osteoporosis. I reviewed phone notes of 6/5 and 6/9 with pt's daughter and reminded her that outcome was referral to endocrinologist to discuss other treatment alternatives. She states that pt took fosamax in the past without side effects that they can remember and think that the medication was stopped by pt and never refilled. They are asking to put pt back on Fosamax. Reiterated that PCP already stated she was not comfortable prescribing fosamax because it is in the same class as reclast and pt has documented allergy to that. Daughter became upset and wants to talk with PCP re: Fosamax. States she doesn't understand why we will not prescribe Fosamax "when pt took it in the past without side effects".  Pt's daughter would like return call from PCP.

## 2015-07-26 NOTE — Telephone Encounter (Signed)
Spoke with daughter. Advised her that due to reaction to Reclast, I was not comfortable prescribing fosamax for her but that I would like for her to meet with endocrinology. I gave her number to endocrinology to schedule an appointment. Daughter verbalizes understanding.

## 2015-08-07 ENCOUNTER — Ambulatory Visit (HOSPITAL_BASED_OUTPATIENT_CLINIC_OR_DEPARTMENT_OTHER)
Admission: RE | Admit: 2015-08-07 | Discharge: 2015-08-07 | Disposition: A | Discharge status: Home / Self Care | Payer: Medicare Other | Source: Ambulatory Visit | Attending: Orthopaedic Surgery | Admitting: Orthopaedic Surgery

## 2015-08-07 DIAGNOSIS — M5136 Other intervertebral disc degeneration, lumbar region: Secondary | ICD-10-CM | POA: Diagnosis not present

## 2015-08-07 DIAGNOSIS — I700 Atherosclerosis of aorta: Secondary | ICD-10-CM | POA: Insufficient documentation

## 2015-08-07 DIAGNOSIS — M4807 Spinal stenosis, lumbosacral region: Secondary | ICD-10-CM | POA: Insufficient documentation

## 2015-08-07 DIAGNOSIS — M545 Low back pain: Secondary | ICD-10-CM | POA: Insufficient documentation

## 2015-08-14 ENCOUNTER — Ambulatory Visit: Payer: Medicare Other | Admitting: Endocrinology

## 2015-08-21 ENCOUNTER — Ambulatory Visit (INDEPENDENT_AMBULATORY_CARE_PROVIDER_SITE_OTHER): Payer: Medicare Other | Admitting: Endocrinology

## 2015-08-21 ENCOUNTER — Encounter: Payer: Self-pay | Admitting: Endocrinology

## 2015-08-21 VITALS — BP 126/65 | HR 98 | Temp 98.7°F | Ht 60.0 in | Wt 112.0 lb

## 2015-08-21 DIAGNOSIS — M810 Age-related osteoporosis without current pathological fracture: Secondary | ICD-10-CM

## 2015-08-21 NOTE — Progress Notes (Signed)
Subjective:    Patient ID: [name] [name], female    DOB: 28-Mar-1925, 82 y.o.   MRN: [id number]  HPI Pt was noted to have osteoporosis in approx 2008.  She has never been on medication for this.  she has never had bony fracture, except for left ankle in 1946.  She has no history of any of the following: early menopause, multiple myeloma, renal dz, thyroid problems, prolonged bedrest, alcoholism, liver dz, primary hyperparathyroidism.  She does not take heparin or anticonvulsants.  She quit smoking many years ago.  She has never been prolonged steroids. She was rx'ed for vit-D deficiency in 2016, but she not takes just a low dose on non-rx vit-D. She says she received an infusion of reclast in approx 2009, at a doctor's office, and had a severe side effect, but does not recall exactly what type.   Past Medical History:  Diagnosis Date  . Abdominal pain    Resolved  . Cardiac arrhythmia   . CHF (congestive heart failure) ([place])    Diastolic  on ECHO 3710  . Choledocholithiasis   . Chronic anemia   . Chronic back pain   . COPD (chronic obstructive pulmonary disease) (HCC)    oxygen dependent at  night 2LPM  . Daily headache   . GERD (gastroesophageal reflux disease)   . HCAP (healthcare-associated pneumonia) 03/28/2014  . Hiatal hernia   . Hyperlipidemia   . Hypertension   . On home oxygen therapy    "1L during the day; 2L q hs" (03/28/2014)  . Osteoarthritis    "pretty much q where"   . Osteoporosis   . Pancolitis (HCC)    Infectious vs. inflammatory  . Pancreatitis    History of  . Pneumonia 1930's; 2011 X 2; 02/2014  . PONV (postoperative nausea and vomiting)   . PUD (peptic ulcer disease)   . Reactive airway disease that is not asthma   . Recurrent UTI (urinary tract infection)    "here lately" (03/28/2014)  . Scoliosis deformity of spine    history of intractable back pain    Past Surgical History:  Procedure Laterality Date  . ABDOMINAL HYSTERECTOMY  1970s  . BACK SURGERY      x 5  . BREAST CYST EXCISION Right 1960's  . CERVICAL LAMINECTOMY  1970s  . CHOLECYSTECTOMY  2008  . ERCP W/ SPHICTEROTOMY  02/2010  . ESOPHAGOGASTRODUODENOSCOPY N/A 11/14/2013   Procedure: ESOPHAGOGASTRODUODENOSCOPY (EGD);  Surgeon: Missy Sabins, MD;  Location: [facility] ENDOSCOPY;  Service: Endoscopy;  Laterality: N/A;  . FLEXIBLE SIGMOIDOSCOPY N/A 01/24/2013   Procedure: FLEXIBLE SIGMOIDOSCOPY;  Surgeon: Missy Sabins, MD;  Location: [place];  Service: Endoscopy;  Laterality: N/A;  . PAIN PUMP IMPLANTATION  ~ 2010   back, dilaudid and bupravacaine  . SPINAL CORD STIMULATOR IMPLANT  ~ 2009  . SPINE SURGERY  [phone]    Social History   Social History  . Marital status: Widowed    Spouse name: N/A  . Number of children: 3  . Years of education: N/A   Occupational History  .  Retired   Social History Main Topics  . Smoking status: Former Smoker    Packs/day: 1.00    Years: 12.00    Types: Cigarettes    Quit date: 01/28/1988  . Smokeless tobacco: Never Used  . Alcohol use No  . Drug use: No  . Sexual activity: No   Other Topics Concern  . Not on file   Social History Narrative  1 grandchild at care link--Angelia   Lives with great-granddaughter    Current Outpatient Prescriptions on File Prior to Visit  Medication Sig Dispense Refill  . acetaminophen (TYLENOL) 500 MG tablet Take 500 mg by mouth 2 (two) times daily as needed for headache.    . albuterol (PROVENTIL HFA;VENTOLIN HFA) 108 (90 BASE) MCG/ACT inhaler Inhale 1-2 puffs into the lungs 3 (three) times daily as needed for wheezing or shortness of breath (wheezing & shortness of breath). Reported on 02/28/2015    . Ascorbic Acid (VITAMIN C) 500 MG tablet Take 500 mg by mouth daily.      . Aspirin-Acetaminophen-Caffeine (GOODY HEADACHE PO) Take by mouth.    . Calcium Carbonate-Vitamin D (CALCIUM 600-D) 600-400 MG-UNIT per tablet Take 1 tablet by mouth 2 (two) times daily with a meal.      . cholecalciferol  (VITAMIN D) 1000 units tablet Take 1,000 Units by mouth daily.    . diclofenac sodium (VOLTAREN) 1 % GEL Place 1 application onto the skin 2 (two) times daily as needed.    . Fluticasone-Salmeterol (ADVAIR DISKUS) 250-50 MCG/DOSE AEPB Inhale 1 puff into the lungs 2 (two) times daily. 1 each 6  . gabapentin (NEURONTIN) 100 MG capsule Take 1 capsule (100 mg total) by mouth 2 (two) times daily. 60 capsule 3  . hyoscyamine (LEVSIN SL) 0.125 MG SL tablet Take 0.125 mg by mouth as needed. Abdominal pain    . lactulose (CHRONULAC) 10 GM/15ML solution 2 teaspoonful at bedtime as needed for constipation 500 mL 0  . Multiple Vitamins-Minerals (MULTIVITAMIN WITH MINERALS) tablet Take 1 tablet by mouth daily. 30 tablet   . nitroGLYCERIN (NITROSTAT) 0.4 MG SL tablet Place 1 tablet (0.4 mg total) under the tongue every 5 (five) minutes as needed for chest pain. 100 tablet 3  . omeprazole (PRILOSEC) 40 MG capsule Take 1 capsule (40 mg total) by mouth 2 (two) times daily. 60 capsule 3  . Probiotic Product (PROBIOTIC DAILY PO) Take 1 tablet by mouth daily at 12 noon.    [name] VITAMIN E PO Take 1 tablet by mouth daily.     No current facility-administered medications on file prior to visit.     Allergies  Allergen Reactions  . Codeine Diarrhea and Nausea And Vomiting    REACTION: n/v/d, HA  . Morphine Diarrhea and Nausea And Vomiting    REACTION: n/v/d, HA  . Sulfa Antibiotics Nausea Only    Sick    . Zoledronic Acid Swelling    REACTION: Severe edema    Family History  Problem Relation Age of Onset  . Cancer Mother     uterine  . Heart disease Father   . Hypertension Other   . Osteoporosis Neg Hx     BP 126/65   Pulse 98   Temp 98.7 F (37.1 C) (Oral)   Ht 5' (1.524 m)   Wt 112 lb (50.8 kg)   SpO2 99%   BMI 21.87 kg/m    Review of Systems denies hematuria, cold intolerance, edema, skin rash, falls, cramps, memory loss, and rhinorrhea.  She has chronic mild weight loss.  Heartburn is  well-controlled.  She has insomnia, easy bruising, and mid and low-back pain.    Objective:   Physical Exam VS: see vs page GEN: no distress HEAD: head: no deformity eyes: no periorbital swelling, no proptosis external nose and ears are normal mouth: no lesion seen NECK: supple, thyroid is not enlarged CHEST WALL: no deformity.  moderate kyphosis.  LUNGS: clear to auscultation CV: reg rate and rhythm, no murmur ABD: abdomen is soft, nontender.  no hepatosplenomegaly.  not distended.  no hernia. Implanted pump is palpable.   MUSCULOSKELETAL: muscle bulk and strength are grossly normal.  no obvious joint swelling.  gait is steady with a cane EXTEMITIES: no deformity.  no ulcer on the feet.  feet are of normal color and temp.  no edema PULSES: dorsalis pedis intact bilat.  no carotid bruit NEURO:  cn 2-12 grossly intact.   readily moves all 4's.  sensation is intact to touch on the feet SKIN:  Normal texture and temperature.  No rash or suspicious lesion is visible.   NODES:  None palpable at the neck PSYCH: alert, well-oriented.  Does not appear anxious nor depressed.   Lab Results  Component Value Date   CALCIUM 9.0 06/29/2015   CAION 0.98 (L) 01/24/2010   PHOS 3.3 11/12/2014   Lab Results  Component Value Date   TSH 0.89 06/29/2015   Lab Results  Component Value Date   CREATININE 0.80 06/29/2015   BUN 20 06/29/2015   NA 142 06/29/2015   K 4.7 06/29/2015   CL 103 06/29/2015   CO2 32 06/29/2015   CXR (2010): scoliosis. DEXA: The BMD measured at Forearm Radius 33% is 0.503 g/cm2 with a T-score of -4.3. This patient is considered osteoporotic according to [facility]) criteria. Lumbar spine was not utilized due to advanced degenerative changes.  Site Region Measured Date Measured Age WHO YA BMD Classification T-score DualFemur Neck Right 8/29/2015 88.1 [age] Osteopenia -1.3 0.860 g/cm2  Left Forearm Radius 33% 06/29/2015 88.1 Osteoporosis -4.3 0.503  g/cm2  Lab Results  Component Value Date   PTH 30 08/21/2015   CALCIUM 8.8 08/21/2015   CAION 0.98 (L) 01/24/2010   PHOS 3.3 11/12/2014   25-OH vit-D=46.      Assessment & Plan:  Osteoporosis, severe.  Intolerance to reclast, new to me: uncertain type.  I have requested PA for prolia.  Patient is advised the following: Patient Instructions  blood tests are requested for you today.   We'll let you know about the results. Based on the results, I hope to be able to prescribe for you a twice a year injection called "Prolia."  It is critically important to prevent falling down (keep floor areas well-lit, dry, and free of loose objects.  If you have a cane, walker, or wheelchair, you should use it, even for short trips around the house.  Wear flat-soled shoes.  Also, try not to rush. Renato Shin, MD

## 2015-08-21 NOTE — Patient Instructions (Addendum)
blood tests are requested for you today.   We'll let you know about the results. Based on the results, I hope to be able to prescribe for you a twice a year injection called "Prolia."  It is critically important to prevent falling down (keep floor areas well-lit, dry, and free of loose objects.  If you have a cane, walker, or wheelchair, you should use it, even for short trips around the house.  Wear flat-soled shoes.  Also, try not to rush.

## 2015-08-22 LAB — PTH, INTACT AND CALCIUM
Calcium: 8.8 mg/dL (ref 8.4–10.5)
PTH: 30 pg/mL (ref 14–64)

## 2015-08-22 LAB — VITAMIN D 25 HYDROXY (VIT D DEFICIENCY, FRACTURES): VITD: 49.67 ng/mL (ref 30.00–100.00)

## 2015-08-23 LAB — PROTEIN ELECTROPHORESIS, SERUM
Albumin ELP: 3.9 g/dL (ref 3.8–4.8)
Alpha-1-Globulin: 0.3 g/dL (ref 0.2–0.3)
Alpha-2-Globulin: 0.7 g/dL (ref 0.5–0.9)
Beta 2: 0.3 g/dL (ref 0.2–0.5)
Beta Globulin: 0.4 g/dL (ref 0.4–0.6)
Gamma Globulin: 0.5 g/dL — ABNORMAL LOW (ref 0.8–1.7)
Total Protein, Serum Electrophoresis: 6.1 g/dL (ref 6.1–8.1)

## 2015-08-27 DIAGNOSIS — M545 Low back pain: Secondary | ICD-10-CM | POA: Diagnosis not present

## 2015-08-30 DIAGNOSIS — M545 Low back pain: Secondary | ICD-10-CM | POA: Diagnosis not present

## 2015-08-30 DIAGNOSIS — G894 Chronic pain syndrome: Secondary | ICD-10-CM | POA: Diagnosis not present

## 2015-08-30 DIAGNOSIS — Z9689 Presence of other specified functional implants: Secondary | ICD-10-CM | POA: Diagnosis not present

## 2015-08-30 DIAGNOSIS — M5136 Other intervertebral disc degeneration, lumbar region: Secondary | ICD-10-CM | POA: Diagnosis not present

## 2015-09-17 ENCOUNTER — Other Ambulatory Visit: Payer: Self-pay

## 2015-09-17 NOTE — Patient Outreach (Signed)
[facility]) Care Management  09/17/2015  [name] [name] Mar 26, 1925 [id number]   REFERRAL DATE; 09/14/15 REFERRAL SOURCE: Patient engagement survey REFERRAL REASON; Patient cannot afford her inhaler  Telephone call to patient regarding patient engagement referral.  Unable to reach patient. HIPAA compliant voice message left with call back phone number.   PLAN:  RNCM will attempt 2nd telephone call within 1 week.  Quinn Plowman RN,BSN,CCM [facility] Telephonic  [phone]

## 2015-09-19 ENCOUNTER — Other Ambulatory Visit: Payer: Self-pay

## 2015-09-19 NOTE — Patient Outreach (Signed)
[facility]) Care Management  09/19/2015  Anoushka Junk 02-Jun-1925 [id number]   REFERRAL DATE: 09/14/15 REFERRAL SOURCE; Patient engagement referral REFERRAL REASON; Patient cannot afford her inhaler CONSENT;  Self/ patient.  Patient gave verbal authorization to speak with her daughter, [name] regarding any/all of her medical information.  SUBJECTIVE; Telephone call to patient regarding patient engagement referral. HIPAA verified with patient. Discussed and offered patient [facility] care management services. Patient verbally agreed to pharmacy assistance for medication only. Patient denied having nursing needs. Patient states her inhaler is to expensive. Patient states she has breathing problems. Patient reports she uses oxygen at night and during the day as needed. Patient states her oxygen is at 2L. Patient states she is on a pain pump and is seen by a doctor in [facility].  Patient states call her Daughter [name] regarding her medication.   ASSESSMENT: Referral to pharmacy for medication assistance.   PLAN; RNCM will refer patient to pharmacy.   Quinn Plowman RN,BSN,CCM [facility] Telephonic  [phone]

## 2015-10-02 ENCOUNTER — Other Ambulatory Visit: Payer: Self-pay

## 2015-10-02 NOTE — Patient Outreach (Signed)
[facility]) Care Management  10/02/2015  Reuben Winer 05/23/1925 [id number]   Referral to [facility] care management pharmacy as discussed with patient.   PLAN; Pharmacy referral made.   Quinn Plowman RN,BSN,CCM [facility] Telephonic  [phone]

## 2015-10-09 ENCOUNTER — Encounter: Payer: Self-pay | Admitting: Family

## 2015-10-09 ENCOUNTER — Ambulatory Visit (INDEPENDENT_AMBULATORY_CARE_PROVIDER_SITE_OTHER): Payer: Medicare Other | Admitting: Family

## 2015-10-09 DIAGNOSIS — I10 Essential (primary) hypertension: Secondary | ICD-10-CM | POA: Diagnosis not present

## 2015-10-09 DIAGNOSIS — Z23 Encounter for immunization: Secondary | ICD-10-CM

## 2015-10-09 DIAGNOSIS — M810 Age-related osteoporosis without current pathological fracture: Secondary | ICD-10-CM

## 2015-10-09 DIAGNOSIS — J42 Unspecified chronic bronchitis: Secondary | ICD-10-CM

## 2015-10-09 MED ORDER — BUDESONIDE-FORMOTEROL FUMARATE 160-4.5 MCG/ACT IN AERO: 2.0000 | INHALATION_SPRAY | Freq: Two times a day (BID) | RESPIRATORY_TRACT | 11 refills | Status: DC

## 2015-10-09 MED ORDER — ALBUTEROL SULFATE HFA 108 (90 BASE) MCG/ACT IN AERS: 1.0000 | INHALATION_SPRAY | Freq: Four times a day (QID) | RESPIRATORY_TRACT | 5 refills | Status: DC | PRN

## 2015-10-09 NOTE — Assessment & Plan Note (Signed)
Advised daughter to speak more with Dr. Loanne Drilling about other options and/or concerns about prolia.

## 2015-10-09 NOTE — Assessment & Plan Note (Signed)
Stable off of meds.  Monitor.  

## 2015-10-09 NOTE — Progress Notes (Signed)
Pre visit review using our clinic review tool, if applicable. No additional management support is needed unless otherwise documented below in the visit note. 

## 2015-10-09 NOTE — Progress Notes (Signed)
   Subjective:    Patient ID: [name] [name], female    DOB: 1925-09-13, 82 y.o.   MRN: [id number]  HPI  [name] is an 82 yr old female who presents today for follow up.  1) HTN- not currently on antihypertensive BP Readings from Last 3 Encounters:  10/09/15 139/67  08/21/15 126/65  07/12/15 132/74   2) Osteoporosis-  Has seen endocrinology. Had reaction to Reclast in the past and daughter was reluctant to have patient begin prolia. She wanted to know what other options she had.  The patient was sent to endocrinology and Dr. Loanne Drilling recommended prolia. She does not think she wants her mom to take "prolia" at her age and again wants to know other options.   3)  COPD-  Cost has been an issue with her medications. She reports that symbicort worked better than advair (what she is taking now). They were advised by dr. Elsworth Soho to check if Ruthe Mannan would be cheaper but they have not done this.      Review of Systems See HPI    exam   Physical Exam  Constitutional: She appears well-developed and well-nourished.  Cardiovascular: Normal rate, regular rhythm and normal heart sounds.   No murmur heard. Pulmonary/Chest: Effort normal and breath sounds normal. No respiratory distress. She has no wheezes.  Psychiatric: She has a normal mood and affect. Her behavior is normal. Judgment and thought content normal.          Assessment & Plan:

## 2015-10-09 NOTE — Patient Instructions (Signed)
Please check with your insurance to see if [name] [name] is cheaper for you than advair or symbicort.  Please complete symbicort patient assistance and see if they can help you with cough. You may use albuterol as needed for shortness of breath.

## 2015-10-09 NOTE — Assessment & Plan Note (Signed)
They would like to pursue patient assistance for symbicort. I have provided them with a printed prescription as well as the paperwork to file with [employer] for symbicort. She is instructed to continue advair for now and also check insurance cost of Padroni. If dulera is less expensive for her we can consider it.

## 2015-10-10 ENCOUNTER — Telehealth: Payer: Self-pay | Admitting: Family

## 2015-10-10 ENCOUNTER — Other Ambulatory Visit: Payer: Self-pay | Admitting: Pharmacist

## 2015-10-10 NOTE — Telephone Encounter (Signed)
Spoke with pt's daughter. She could not remember the name of the endocrinologist pt saw re: osteoporosis treatment. Provided pt with name and number for Dr Loanne Drilling. Also, she states pt assistance forms for symbicort require PCP signature. She will fax form to my attention at [phone]. Awaiting forms.

## 2015-10-10 NOTE — Telephone Encounter (Signed)
Caller name: Keturah Shavers Relation to [id number] Call back [phone]   Reason for call:  Daughter would like to discuss form given at patient appointment on 10/09/15. Daughter states it was an assistance form with the following medication: budesonide-formoterol (SYMBICORT) 160-4.5 MCG/ACT inhaler

## 2015-10-11 ENCOUNTER — Encounter: Payer: Self-pay | Admitting: Pharmacist

## 2015-10-11 NOTE — Patient Outreach (Signed)
[facility]) Care Management  [place]   10/11/2015  Persephanie Brimberry Apr 03, 1925 [id number]  Late entry for 10/10/15  Subjective:  Patient referred to [place] by [facility] Telephonic RN, Lamar Blinks for medication assistance evaluation for inhaler.   Per [facility] Telephonic RN note in chart on 09/19/15, patient had requested that her daughter, [name] be contacted.    Placed call to number for daughter, and daughter answered and verified HIPAA compliance.    Daughter states that she is already working on applying to [place] for patient's Symbicort as she received the paperwork from patient's PCP.    Objective:   Current Medications: Current Outpatient Prescriptions  Medication Sig Dispense Refill  . albuterol (PROVENTIL HFA;VENTOLIN HFA) 108 (90 Base) MCG/ACT inhaler Inhale 1-2 puffs into the lungs every 6 (six) hours as needed (wheezing & shortness of breath). Reported on 02/28/2015 1 Inhaler 5  . acetaminophen (TYLENOL) 500 MG tablet Take 500 mg by mouth 2 (two) times daily as needed for headache.    . Ascorbic Acid (VITAMIN C) 500 MG tablet Take 500 mg by mouth daily.      . Aspirin-Acetaminophen-Caffeine (GOODY HEADACHE PO) Take by mouth.    . budesonide-formoterol (SYMBICORT) 160-4.5 MCG/ACT inhaler Inhale 2 puffs into the lungs 2 (two) times daily. 1 Inhaler 11  . Calcium Carbonate-Vitamin D (CALCIUM 600-D) 600-400 MG-UNIT per tablet Take 1 tablet by mouth 2 (two) times daily with a meal.      . cholecalciferol (VITAMIN D) 1000 units tablet Take 1,000 Units by mouth daily.    . diclofenac sodium (VOLTAREN) 1 % GEL Place 1 application onto the skin 2 (two) times daily as needed.    . gabapentin (NEURONTIN) 100 MG capsule Take 1 capsule (100 mg total) by mouth 2 (two) times daily. 60 capsule 3  . hyoscyamine (LEVSIN SL) 0.125 MG SL tablet Take 0.125 mg by mouth as needed. Abdominal pain    . lactulose (CHRONULAC) 10 GM/15ML solution 2 teaspoonful at bedtime as  needed for constipation 500 mL 0  . Multiple Vitamins-Minerals (MULTIVITAMIN WITH MINERALS) tablet Take 1 tablet by mouth daily. 30 tablet   . nitroGLYCERIN (NITROSTAT) 0.4 MG SL tablet Place 1 tablet (0.4 mg total) under the tongue every 5 (five) minutes as needed for chest pain. 100 tablet 3  . omeprazole (PRILOSEC) 40 MG capsule Take 1 capsule (40 mg total) by mouth 2 (two) times daily. 60 capsule 3  . oxyCODONE-acetaminophen (PERCOCET) 7.5-325 MG tablet Take 1 tablet by mouth 2 (two) times daily as needed.    . Probiotic Product (PROBIOTIC DAILY PO) Take 1 tablet by mouth daily at 12 noon.    [name] VITAMIN E PO Take 1 tablet by mouth daily.     No current facility-administered medications for this visit.     Functional Status: In your present state of health, do you have any difficulty performing the following activities: 11/11/2014  Hearing? N  Vision? N  Difficulty concentrating or making decisions? N  Walking or climbing stairs? N  Dressing or bathing? N  Doing errands, shopping? 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  As both PCP note from 10/09/15 and Dr Bari Mantis note from 07/12/15 discussed Advair or Ruthe Mannan, also discussed [place] and [employer] patient assistance programs.    [place] requires Medicare Part D beneficiaries to spend $600 out-of-pocket on prescriptions in the calendar year and [employer] reviews Medicare beneficiary patient assistance applications on a case-by-case basis and requires original application to be mailed  Daughter reports she will follow-up with patient's PCP on either the [employer] or [employer] patient assistance  applications.     Plan:  Will route this note to patient's PCP to make her aware of [facility] Pharmacist phone call to patient's daughter.   Will place follow-up call to patient's daughter within the next 2 weeks.    Karrie Meres, PharmD, [place] [phone]

## 2015-10-12 ENCOUNTER — Telehealth: Payer: Self-pay | Admitting: Endocrinology

## 2015-10-12 NOTE — Telephone Encounter (Signed)
Spoke with pt's daughter. She has not had chance to return form to [place]. States she is currently sick but will either fax or mail it to [place]. Awaiting form.

## 2015-10-12 NOTE — Telephone Encounter (Signed)
PT caregiver called in and said that they have decided to go ahead with the Prolia.  I informed her that we would have to get it approved and then call back to schedule.

## 2015-10-12 NOTE — Telephone Encounter (Signed)
See message to be advised.  Rose can we initiate the PA for the Prolia injection?

## 2015-10-18 ENCOUNTER — Telehealth: Payer: Self-pay

## 2015-10-18 NOTE — Telephone Encounter (Signed)
Laboratory/Information request form receive via fax on 10/12/15.  Filled in form as much as possible and forwarded to provider.

## 2015-10-19 ENCOUNTER — Other Ambulatory Visit: Payer: Self-pay | Admitting: Pharmacist

## 2015-10-19 NOTE — Patient Outreach (Signed)
[facility]) Care Management  10/19/2015  [name] [name] 1925-05-25 [id number]  Unsuccessful phone outreach to patient's daughter, whom patient has previously instructed [facility] staff to contact.   Left HIPAA compliant voicemail requesting a return call.   Plan:  Will await return call or make another outreach attempt next week.   Karrie Meres, PharmD, [place] [phone]

## 2015-10-26 ENCOUNTER — Ambulatory Visit: Payer: Self-pay | Admitting: Pharmacist

## 2015-10-29 ENCOUNTER — Other Ambulatory Visit: Payer: Self-pay | Admitting: Pharmacist

## 2015-10-29 NOTE — Patient Outreach (Signed)
[place] [facility]) Care Management  10/29/2015  [name] [name] 20-Nov-1925 [id number]   Second unsuccessful phone outreach to patient's daughter, whom patient has previously instructed [facility] staff to contact.   Left a HIPAA compliant voicemail requesting a return call.    Plan:  Will await return call or make another outreach attempt next week.   Karrie Meres, PharmD, [place] [phone]

## 2015-10-29 NOTE — Telephone Encounter (Signed)
I have electronically submitted pt's info for Prolia insurance verification and will notify you once I have a response. Thank you. °

## 2015-10-30 ENCOUNTER — Encounter: Payer: Self-pay | Admitting: Pharmacist

## 2015-10-30 NOTE — Patient Outreach (Signed)
[facility]) Care Management  10/30/2015  [name] [name] 1925-06-21 [id number]  This is a late entry for 10/29/15.   Received an inbound call from patient's daughter, [name] [name], who verified patient's HIPAA details and was returning call to [place].   [name] reports that she completed the patient assistance paperwork for patient's inhaler and she is waiting for her mom to sign papers so she can try to apply for assistance for patient's inhaler.    [name] denied other pharmacy related needs at this time and denies other questions/concerns.  Reminded [name] for Medicare Part D patients, some manufacturers only approve patients through 12/31 of the current calendar year.   Plan:  Will close pharmacy case at this time.    [name] [name] has [facility] contact information should a new need arise.   Will send closure letter to PCP.    Karrie Meres, PharmD, [place] [phone]

## 2015-11-01 ENCOUNTER — Telehealth: Payer: Self-pay | Admitting: Family

## 2015-11-01 NOTE — Telephone Encounter (Signed)
Patient's daughter called to inform that she would like to take Fosamax for her bone loss. Please advise.  Caller: [name] Patient Relation: daughter Patient phone: [phone]

## 2015-11-02 NOTE — Telephone Encounter (Signed)
Called the daughter back no answer left message for [name] to give the office a call back.

## 2015-11-02 NOTE — Telephone Encounter (Signed)
Please let daughter know that she should not take fosamax because she had a severe reaction to Reclast and these medicines are in the same class. She is likely to have same reaction to fosamax.  If she wishes to treat her osteoporosis, the endocrinologist and I both recommend prolia injections because this would be a safer option for her.

## 2015-11-05 NOTE — Telephone Encounter (Signed)
I have rec'd [name] [name]'s insurance verification for Prolia and she has an estimated responsibility of $0.  Please make pt aware this is an estimate and we will not know an exact amt until insurance(s) has/have paid.  I have sent a copy of the summary of benefits to be scanned into pt's chart.    Once pt recs injection, please let me know actual injection date so I can update the Prolia portal.    If you have any questions, please let me know.  Thank you!

## 2015-11-06 NOTE — Telephone Encounter (Signed)
[place]-- can you verify prolia benefits for this pt? New start. Has osteoporosis. Had allergic reaction to Reclast, not a candidate for fosamax as it is in the same class as Reclast.  Thanks!  Notified pt's daughter and she voices understanding. She states that pt will proceed with Prolia.

## 2015-11-06 NOTE — Telephone Encounter (Signed)
I contacted the patient and left a voicemail and advised of message. Requested a call back from the patient to schedule her nurse visit for a prolia injection.

## 2015-11-07 ENCOUNTER — Telehealth: Payer: Self-pay | Admitting: Family

## 2015-11-07 ENCOUNTER — Telehealth: Payer: Self-pay | Admitting: Endocrinology

## 2015-11-07 NOTE — Telephone Encounter (Signed)
Verification has been canceled, per patients daughter they will get the medication thru Dr. Cordelia Pen office.

## 2015-11-07 NOTE — Telephone Encounter (Signed)
Ok, we will not proceed with Prolia benefits if Dr Loanne Drilling is going manage.

## 2015-11-07 NOTE — Telephone Encounter (Signed)
Caller name: [name] Relationship to patient: Daughter Can be reached: [phone]  Pharmacy:  Reason for call: FYI: Called to inform provider that Dr Loanne Drilling is ordering the Prolia.

## 2015-11-07 NOTE — Telephone Encounter (Signed)
Pt daughter, [name], called back about Prolia she said she just had some questions about how it works before she schedules.

## 2015-11-08 NOTE — Telephone Encounter (Signed)
I contacted [name] [name] and she stated at this time she had no questions about the prolia at this time and was ready to schedule. Patient scheduled for 11/14/2015.

## 2015-11-14 ENCOUNTER — Other Ambulatory Visit: Payer: Self-pay | Admitting: *Deleted

## 2015-11-14 ENCOUNTER — Ambulatory Visit: Payer: Medicare Other | Admitting: *Deleted

## 2015-11-14 DIAGNOSIS — M810 Age-related osteoporosis without current pathological fracture: Secondary | ICD-10-CM

## 2015-11-14 DIAGNOSIS — M818 Other osteoporosis without current pathological fracture: Secondary | ICD-10-CM

## 2015-11-14 MED ORDER — DENOSUMAB 60 MG/ML ~~LOC~~ SOLN: 60.0000 mg | Freq: Once | SUBCUTANEOUS | 0 refills | Status: AC

## 2015-11-14 MED ORDER — DENOSUMAB 60 MG/ML ~~LOC~~ SOLN
60.0000 mg | Freq: Once | SUBCUTANEOUS | Status: AC
  Administered 2015-11-14: 60 mg via SUBCUTANEOUS

## 2015-11-22 NOTE — Telephone Encounter (Signed)
Done

## 2015-11-28 DIAGNOSIS — Z79899 Other long term (current) drug therapy: Secondary | ICD-10-CM | POA: Diagnosis not present

## 2015-11-28 DIAGNOSIS — Z5181 Encounter for therapeutic drug level monitoring: Secondary | ICD-10-CM | POA: Diagnosis not present

## 2015-11-29 DIAGNOSIS — M069 Rheumatoid arthritis, unspecified: Secondary | ICD-10-CM | POA: Diagnosis not present

## 2015-11-29 DIAGNOSIS — Z79899 Other long term (current) drug therapy: Secondary | ICD-10-CM | POA: Diagnosis not present

## 2015-11-29 DIAGNOSIS — Z87891 Personal history of nicotine dependence: Secondary | ICD-10-CM | POA: Diagnosis not present

## 2015-11-29 DIAGNOSIS — I10 Essential (primary) hypertension: Secondary | ICD-10-CM | POA: Diagnosis not present

## 2015-11-29 DIAGNOSIS — G894 Chronic pain syndrome: Secondary | ICD-10-CM | POA: Diagnosis not present

## 2015-11-29 DIAGNOSIS — M5136 Other intervertebral disc degeneration, lumbar region: Secondary | ICD-10-CM | POA: Diagnosis not present

## 2015-11-29 DIAGNOSIS — M47816 Spondylosis without myelopathy or radiculopathy, lumbar region: Secondary | ICD-10-CM | POA: Diagnosis not present

## 2015-11-29 DIAGNOSIS — M47817 Spondylosis without myelopathy or radiculopathy, lumbosacral region: Secondary | ICD-10-CM | POA: Diagnosis not present

## 2015-11-29 DIAGNOSIS — Z885 Allergy status to narcotic agent status: Secondary | ICD-10-CM | POA: Diagnosis not present

## 2015-11-29 DIAGNOSIS — J449 Chronic obstructive pulmonary disease, unspecified: Secondary | ICD-10-CM | POA: Diagnosis not present

## 2015-11-29 DIAGNOSIS — Z7951 Long term (current) use of inhaled steroids: Secondary | ICD-10-CM | POA: Diagnosis not present

## 2016-01-01 ENCOUNTER — Telehealth: Payer: Self-pay | Admitting: *Deleted

## 2016-01-01 NOTE — Telephone Encounter (Signed)
Called patient and left message to return call

## 2016-01-10 DIAGNOSIS — M545 Low back pain: Secondary | ICD-10-CM | POA: Diagnosis not present

## 2016-01-10 DIAGNOSIS — G8929 Other chronic pain: Secondary | ICD-10-CM | POA: Diagnosis not present

## 2016-01-10 DIAGNOSIS — M549 Dorsalgia, unspecified: Secondary | ICD-10-CM | POA: Diagnosis not present

## 2016-01-10 DIAGNOSIS — M5136 Other intervertebral disc degeneration, lumbar region: Secondary | ICD-10-CM | POA: Diagnosis not present

## 2016-02-05 ENCOUNTER — Ambulatory Visit: Payer: Medicare Other | Admitting: Family

## 2016-02-13 ENCOUNTER — Ambulatory Visit: Payer: Medicare Other | Admitting: Family

## 2016-02-20 ENCOUNTER — Encounter: Payer: Self-pay | Admitting: Family

## 2016-02-20 ENCOUNTER — Ambulatory Visit (INDEPENDENT_AMBULATORY_CARE_PROVIDER_SITE_OTHER): Payer: Medicare Other | Admitting: Family

## 2016-02-20 ENCOUNTER — Telehealth: Payer: Self-pay | Admitting: Family

## 2016-02-20 VITALS — BP 131/59 | HR 77 | Temp 98.4°F | Resp 16 | Ht 60.0 in | Wt 112.0 lb

## 2016-02-20 DIAGNOSIS — J449 Chronic obstructive pulmonary disease, unspecified: Secondary | ICD-10-CM

## 2016-02-20 DIAGNOSIS — I10 Essential (primary) hypertension: Secondary | ICD-10-CM | POA: Diagnosis not present

## 2016-02-20 DIAGNOSIS — M818 Other osteoporosis without current pathological fracture: Secondary | ICD-10-CM | POA: Diagnosis not present

## 2016-02-20 DIAGNOSIS — J441 Chronic obstructive pulmonary disease with (acute) exacerbation: Secondary | ICD-10-CM

## 2016-02-20 LAB — BASIC METABOLIC PANEL
BUN: 16 mg/dL (ref 6–23)
CO2: 33 mEq/L — ABNORMAL HIGH (ref 19–32)
Calcium: 8.7 mg/dL (ref 8.4–10.5)
Chloride: 104 mEq/L (ref 96–112)
Creatinine, Ser: 0.79 mg/dL (ref 0.40–1.20)
GFR: 72.87 mL/min (ref >60.00)
Glucose, Bld: 69 mg/dL — ABNORMAL LOW (ref 70–99)
Potassium: 4.3 mEq/L (ref 3.5–5.1)
Sodium: 142 mEq/L (ref 135–145)

## 2016-02-20 NOTE — Assessment & Plan Note (Signed)
Stable off meds.  Monitor.  

## 2016-02-20 NOTE — Telephone Encounter (Signed)
I have added patient to our portal and sent a message to the Prolia rep to verify.

## 2016-02-20 NOTE — Telephone Encounter (Signed)
[place]-- please advise?

## 2016-02-20 NOTE — Patient Instructions (Addendum)
Please complete lab work prior to leaving.  We will look into scheduling your prolia injection through our office in April. Please schedule a follow up appointment with your pulmonary doctor- Dr. Elsworth Soho [phone]

## 2016-02-20 NOTE — Progress Notes (Signed)
Subjective:    Patient ID: [name] [name], female    DOB: [birth year], 83 y.o.   MRN: [id number]  HPI  [name] [name] is an 83 yr old female who presents today for follow up.  1) COPD- She is maintained on home oxygen which was ordered by her pulmonary team. She was due to follow up with them in September of 2017.  She reports that her shortness of breath is "all the time." She reports that she only uses oxygen prn during the day. She wears it while she sleeps at night. She reports good compliance with symbicort.    2) HTN- not currently on BP med.  BP Readings from Last 3 Encounters:  02/20/16 (!) 131/59  10/09/15 139/67  08/21/15 126/65   3) Osteoporosis-  She received her first prolia shot in October from endocrinology.  Review of Systems     Past Medical History:  Diagnosis Date  . Abdominal pain    Resolved  . Cardiac arrhythmia   . CHF (congestive heart failure) ([place])    Diastolic  on ECHO 0000000  . Choledocholithiasis   . Chronic anemia   . Chronic back pain   . COPD (chronic obstructive pulmonary disease) (HCC)    oxygen dependent at  night 2LPM  . Daily headache   . GERD (gastroesophageal reflux disease)   . HCAP (healthcare-associated pneumonia) 03/28/2014  . Hiatal hernia   . Hyperlipidemia   . Hypertension   . On home oxygen therapy    "1L during the day; 2L q hs" (03/28/2014)  . Osteoarthritis    "pretty much q where"   . Osteoporosis   . Pancolitis (HCC)    Infectious vs. inflammatory  . Pancreatitis    History of  . Pneumonia 1930's; 2011 X 2; 02/2014  . PONV (postoperative nausea and vomiting)   . PUD (peptic ulcer disease)   . Reactive airway disease that is not asthma   . Recurrent UTI (urinary tract infection)    "here lately" (03/28/2014)  . Scoliosis deformity of spine    history of intractable back pain     Social History   Social History  . Marital status: Widowed    Spouse name: N/A  . Number of children: 3  . Years of education: N/A    Occupational History  .  Retired   Social History Main Topics  . Smoking status: Former Smoker    Packs/day: 1.00    Years: 12.00    Types: Cigarettes    Quit date: 01/28/1988  . Smokeless tobacco: Never Used  . Alcohol use No  . Drug use: No  . Sexual activity: No   Other Topics Concern  . Not on file   Social History Narrative   1 grandchild at care link--Angelia   Lives with great-granddaughter    Past Surgical History:  Procedure Laterality Date  . ABDOMINAL HYSTERECTOMY  1970s  . BACK SURGERY     x 5  . BREAST CYST EXCISION Right 1960's  . CERVICAL LAMINECTOMY  1970s  . CHOLECYSTECTOMY  2008  . ERCP W/ SPHICTEROTOMY  02/2010  . ESOPHAGOGASTRODUODENOSCOPY N/A 11/14/2013   Procedure: ESOPHAGOGASTRODUODENOSCOPY (EGD);  Surgeon: Missy Sabins, MD;  Location: [facility] ENDOSCOPY;  Service: Endoscopy;  Laterality: N/A;  . FLEXIBLE SIGMOIDOSCOPY N/A 01/24/2013   Procedure: FLEXIBLE SIGMOIDOSCOPY;  Surgeon: Missy Sabins, MD;  Location: [facility];  Service: Endoscopy;  Laterality: N/A;  . PAIN PUMP IMPLANTATION  ~ 2010   back, dilaudid and  bupravacaine  . SPINAL CORD STIMULATOR IMPLANT  ~ 2009  . SPINE SURGERY  1970's,1995,2007    Family History  Problem Relation Age of Onset  . Cancer Mother     uterine  . Heart disease Father   . Hypertension Other   . Osteoporosis Neg Hx     Allergies  Allergen Reactions  . Codeine Diarrhea and Nausea And Vomiting    REACTION: n/v/d, HA  . Morphine Diarrhea and Nausea And Vomiting    REACTION: n/v/d, HA  . Sulfa Antibiotics Nausea Only    Sick    . Zoledronic Acid Swelling    REACTION: Severe edema    Current Outpatient Prescriptions on File Prior to Visit  Medication Sig Dispense Refill  . acetaminophen (TYLENOL) 500 MG tablet Take 500 mg by mouth 2 (two) times daily as needed for headache.    . albuterol (PROVENTIL HFA;VENTOLIN HFA) 108 (90 Base) MCG/ACT inhaler Inhale 1-2 puffs into the lungs every 6 (six) hours as  needed (wheezing & shortness of breath). Reported on 02/28/2015 1 Inhaler 5  . Ascorbic Acid (VITAMIN C) 500 MG tablet Take 500 mg by mouth daily.      . Aspirin-Acetaminophen-Caffeine (GOODY HEADACHE PO) Take by mouth.    . budesonide-formoterol (SYMBICORT) 160-4.5 MCG/ACT inhaler Inhale 2 puffs into the lungs 2 (two) times daily. 1 Inhaler 11  . Calcium Carbonate-Vitamin D (CALCIUM 600-D) 600-400 MG-UNIT per tablet Take 1 tablet by mouth 2 (two) times daily with a meal.      . cholecalciferol (VITAMIN D) 1000 units tablet Take 1,000 Units by mouth daily.    . diclofenac sodium (VOLTAREN) 1 % GEL Place 1 application onto the skin 2 (two) times daily as needed.    . gabapentin (NEURONTIN) 100 MG capsule Take 1 capsule (100 mg total) by mouth 2 (two) times daily. 60 capsule 3  . hyoscyamine (LEVSIN SL) 0.125 MG SL tablet Take 0.125 mg by mouth as needed. Abdominal pain    . lactulose (CHRONULAC) 10 GM/15ML solution 2 teaspoonful at bedtime as needed for constipation 500 mL 0  . Multiple Vitamins-Minerals (MULTIVITAMIN WITH MINERALS) tablet Take 1 tablet by mouth daily. 30 tablet   . nitroGLYCERIN (NITROSTAT) 0.4 MG SL tablet Place 1 tablet (0.4 mg total) under the tongue every 5 (five) minutes as needed for chest pain. 100 tablet 3  . omeprazole (PRILOSEC) 40 MG capsule Take 1 capsule (40 mg total) by mouth 2 (two) times daily. 60 capsule 3  . Probiotic Product (PROBIOTIC DAILY PO) Take 1 tablet by mouth daily at 12 noon.    [name] VITAMIN E PO Take 1 tablet by mouth daily.     No current facility-administered medications on file prior to visit.     BP (!) 131/59 (BP Location: Right Arm, Cuff Size: Normal)   Pulse 77   Temp 98.4 F (36.9 C) (Oral)   Resp 16   Ht 5' (1.524 m)   Wt 112 lb (50.8 kg)   SpO2 94% Comment: room air  BMI 21.87 kg/m    Objective:   Physical Exam  Constitutional: She is oriented to person, place, and time. She appears well-developed and well-nourished.  HENT:  Head:  Normocephalic and atraumatic.  Cardiovascular: Normal rate, regular rhythm and normal heart sounds.   No murmur heard. Pulmonary/Chest: Effort normal and breath sounds normal. No respiratory distress. She has no wheezes.  Musculoskeletal: She exhibits no edema.  Neurological: She is alert and oriented to person, place, and  time.  Psychiatric: She has a normal mood and affect. Her behavior is normal. Judgment and thought content normal.          Assessment & Plan:  COPD- advised pt to follow up with pulmonary. Appears at baseline. Continue home oxygen per pulmonary.

## 2016-02-20 NOTE — Assessment & Plan Note (Signed)
She wishes to have prolia shots at our office moving forward. I will see if we can arrange this.

## 2016-02-20 NOTE — Telephone Encounter (Signed)
Patient would like to receive prolia injection through [place] after 05/15/15. Is it possible to do this if approval was initiated by endo? thanks

## 2016-02-20 NOTE — Progress Notes (Signed)
Pre visit review using our clinic review tool, if applicable. No additional management support is needed unless otherwise documented below in the visit note. 

## 2016-02-27 DIAGNOSIS — M5136 Other intervertebral disc degeneration, lumbar region: Secondary | ICD-10-CM | POA: Diagnosis not present

## 2016-02-27 DIAGNOSIS — R51 Headache: Secondary | ICD-10-CM | POA: Diagnosis not present

## 2016-02-27 DIAGNOSIS — Z9689 Presence of other specified functional implants: Secondary | ICD-10-CM | POA: Diagnosis not present

## 2016-02-27 DIAGNOSIS — M549 Dorsalgia, unspecified: Secondary | ICD-10-CM | POA: Diagnosis not present

## 2016-02-27 DIAGNOSIS — G8929 Other chronic pain: Secondary | ICD-10-CM | POA: Diagnosis not present

## 2016-02-27 DIAGNOSIS — M545 Low back pain: Secondary | ICD-10-CM | POA: Diagnosis not present

## 2016-03-18 NOTE — Telephone Encounter (Signed)
Any updates from prolia rep?

## 2016-03-21 ENCOUNTER — Ambulatory Visit (INDEPENDENT_AMBULATORY_CARE_PROVIDER_SITE_OTHER): Payer: Medicare Other | Admitting: Adult Health

## 2016-03-21 ENCOUNTER — Encounter: Payer: Self-pay | Admitting: Adult Health

## 2016-03-21 DIAGNOSIS — J449 Chronic obstructive pulmonary disease, unspecified: Secondary | ICD-10-CM | POA: Diagnosis not present

## 2016-03-21 DIAGNOSIS — J9611 Chronic respiratory failure with hypoxia: Secondary | ICD-10-CM | POA: Diagnosis not present

## 2016-03-21 MED ORDER — BUDESONIDE-FORMOTEROL FUMARATE 160-4.5 MCG/ACT IN AERO: 2.0000 | INHALATION_SPRAY | Freq: Two times a day (BID) | RESPIRATORY_TRACT | 0 refills | Status: DC

## 2016-03-21 NOTE — Addendum Note (Signed)
Addended by: Parke Poisson E on: 03/21/2016 02:51 PM   Modules accepted: Orders

## 2016-03-21 NOTE — Patient Instructions (Addendum)
Continue on Symbicort 2 puffs Twice daily   Continue on Oxygen 2lm At bedtime  .  Set up for Overnight oximetry on room air.  Follow up with Dr. Elsworth Soho  In 6 months and As needed   Please contact office for sooner follow up if symptoms do not improve or worsen or seek emergency care

## 2016-03-21 NOTE — Assessment & Plan Note (Signed)
No desats with ambulation  Cont on O2 At bedtime  . Check ONO on RA .

## 2016-03-21 NOTE — Progress Notes (Signed)
@Patient  ID: [name] [name], female    DOB: April 26, 1925, 83 y.o.   MRN: [id number]  Chief Complaint  Patient presents with  . Follow-up    COPD     Referring provider: Debbrah Alar, NP  HPI: 83 year old female former smoker followed for moderate stage II COPD with emphysema. Past medical history of systolic congestive heart failure.   TEST  Spirometry 2011 showed FEV1 74%, ratio 69, ++BD response with  Post BD FEV1 90%, ratio 74.   03/21/2016 Follow up : COPD /O2 RF  Pt returns for 6 month follow up  For COPD . Remains on Symbicort Twice daily  .  Says she gets winded with walking long distance . No flare of cough or wheezing  Last CXR in 06/2015 COPD changes. PVX/Prevnar utd.   Uses O2 2l/m with activity as needed and At bedtime  . Feels the oxygen really helps.  Her insurance requires o2 qualification. Walk test in office today o2 on room air with no desats  We discussed she will need ONO for bedtime O2 coverage.      Allergies  Allergen Reactions  . Codeine Diarrhea and Nausea And Vomiting    REACTION: n/v/d, HA  . Morphine Diarrhea and Nausea And Vomiting    REACTION: n/v/d, HA  . Sulfa Antibiotics Nausea Only    Sick    . Zoledronic Acid Swelling    REACTION: Severe edema    Immunization History  Administered Date(s) Administered  . Influenza Split 12/05/2010, 10/31/2011  . Influenza Whole 01/16/2009, 11/01/2009  . Influenza, High Dose Seasonal PF 11/08/2014  . Influenza,inj,Quad PF,36+ Mos 11/01/2012, 10/31/2013, 10/09/2015  . Pneumococcal Conjugate-13 12/28/2012  . Pneumococcal Polysaccharide-23 02/13/2004  . Td 06/29/2015  . Zoster 06/23/2013    Past Medical History:  Diagnosis Date  . Abdominal pain    Resolved  . Cardiac arrhythmia   . CHF (congestive heart failure) ([place])    Diastolic  on ECHO 0000000  . Choledocholithiasis   . Chronic anemia   . Chronic back pain   . COPD (chronic obstructive pulmonary disease) ([facility])    oxygen dependent  at  night 2LPM  . Daily headache   . GERD (gastroesophageal reflux disease)   . HCAP (healthcare-associated pneumonia) 03/28/2014  . Hiatal hernia   . Hyperlipidemia   . Hypertension   . On home oxygen therapy    "1L during the day; 2L q hs" (03/28/2014)  . Osteoarthritis    "pretty much q where"   . Osteoporosis   . Pancolitis ([facility])    Infectious vs. inflammatory  . Pancreatitis    History of  . Pneumonia 1930's; 2011 X 2; 02/2014  . PONV (postoperative nausea and vomiting)   . PUD (peptic ulcer disease)   . Reactive airway disease that is not asthma   . Recurrent UTI (urinary tract infection)    "here lately" (03/28/2014)  . Scoliosis deformity of spine    history of intractable back pain    Tobacco History: History  Smoking Status  . Former Smoker  . Packs/day: 1.00  . Years: 12.00  . Types: Cigarettes  . Quit date: 01/28/1988  Smokeless Tobacco  . Never Used   Counseling given: Not Answered   Outpatient Encounter Prescriptions as of 03/21/2016  Medication Sig  . acetaminophen (TYLENOL) 500 MG tablet Take 500 mg by mouth 2 (two) times daily as needed for headache.  . albuterol (PROVENTIL HFA;VENTOLIN HFA) 108 (90 Base) MCG/ACT inhaler Inhale 1-2 puffs into the  lungs every 6 (six) hours as needed (wheezing & shortness of breath). Reported on 02/28/2015  . Ascorbic Acid (VITAMIN C) 500 MG tablet Take 500 mg by mouth daily.    . Aspirin-Acetaminophen-Caffeine (GOODY HEADACHE PO) Take by mouth.  . budesonide-formoterol (SYMBICORT) 160-4.5 MCG/ACT inhaler Inhale 2 puffs into the lungs 2 (two) times daily.  . butalbital-acetaminophen-caffeine (FIORICET, ESGIC) 50-325-40 MG tablet Take 1 tablet by mouth every 4 (four) hours as needed for headache.   . Calcium Carbonate-Vitamin D (CALCIUM 600-D) 600-400 MG-UNIT per tablet Take 1 tablet by mouth 2 (two) times daily with a meal.    . cholecalciferol (VITAMIN D) 1000 units tablet Take 1,000 Units by mouth daily.  . diclofenac sodium  (VOLTAREN) 1 % GEL Place 1 application onto the skin 2 (two) times daily as needed.  . gabapentin (NEURONTIN) 100 MG capsule Take 1 capsule (100 mg total) by mouth 2 (two) times daily.  . hyoscyamine (LEVSIN SL) 0.125 MG SL tablet Take 0.125 mg by mouth as needed. Abdominal pain  . lactulose (CHRONULAC) 10 GM/15ML solution 2 teaspoonful at bedtime as needed for constipation  . Multiple Vitamins-Minerals (MULTIVITAMIN WITH MINERALS) tablet Take 1 tablet by mouth daily.  . nitroGLYCERIN (NITROSTAT) 0.4 MG SL tablet Place 1 tablet (0.4 mg total) under the tongue every 5 (five) minutes as needed for chest pain.  [name] omeprazole (PRILOSEC) 40 MG capsule Take 1 capsule (40 mg total) by mouth 2 (two) times daily.  [name] oxyCODONE-acetaminophen (PERCOCET) 7.5-325 MG tablet Take 1 tablet by mouth 2 (two) times daily as needed for severe pain.  . Probiotic Product (PROBIOTIC DAILY PO) Take 1 tablet by mouth daily at 12 noon.  [name] VITAMIN E PO Take 1 tablet by mouth daily.   No facility-administered encounter medications on file as of 03/21/2016.      Review of Systems  Constitutional:   No  weight loss, night sweats,  Fevers, chills,  +fatigue, or  lassitude.  HEENT:   No headaches,  Difficulty swallowing,  Tooth/dental problems, or  Sore throat,                No sneezing, itching, ear ache, nasal congestion, post nasal drip,   CV:  No chest pain,  Orthopnea, PND, swelling in lower extremities, anasarca, dizziness, palpitations, syncope.   GI  No heartburn, indigestion, abdominal pain, nausea, vomiting, diarrhea, change in bowel habits, loss of appetite, bloody stools.   Resp:  .  No chest wall deformity  Skin: no rash or lesions.  GU: no dysuria, change in color of urine, no urgency or frequency.  No flank pain, no hematuria   MS:  No joint pain or swelling.  No decreased range of motion.  No back pain.    Physical Exam  BP 128/74 (BP Location: Left Arm, Cuff Size: Normal)   Pulse 60   Ht 5'  (1.524 m)   Wt 110 lb 6.4 oz (50.1 kg)   SpO2 95%   BMI 21.56 kg/m   GEN: A/Ox3; pleasant , NAD, elderly and frail    HEENT:  [place]/AT,  EACs-clear, TMs-wnl, NOSE-clear, THROAT-clear, no lesions, no postnasal drip or exudate noted.   NECK:  Supple w/ fair ROM; no JVD; normal carotid impulses w/o bruits; no thyromegaly or nodules palpated; no lymphadenopathy.    RESP decreased BS in bases ,  no accessory muscle use, no dullness to percussion  CARD:  RRR, no m/r/g, tr peripheral edema, pulses intact, no cyanosis or clubbing.  GI:  Soft & nt; nml bowel sounds; no organomegaly or masses detected.   Musco: Warm bil, no deformities or joint swelling noted.   Neuro: alert, no focal deficits noted.    Skin: Warm, no lesions or rashes    Lab Results:  CBC  BMET  Imaging: No results found.   Assessment & Plan:   COPD (chronic obstructive pulmonary disease) ([place]) Compensated on present regimen  Plan  Patient Instructions  Continue on Symbicort 2 puffs Twice daily   Continue on Oxygen 2lm At bedtime  .  Set up for Overnight oximetry on room air.  Follow up with Dr. Elsworth Soho  In 6 months and As needed   Please contact office for sooner follow up if symptoms do not improve or worsen or seek emergency care      Chronic respiratory failure ([place]) No desats with ambulation  Cont on O2 At bedtime  . Check ONO on RA .       Rexene Edison, NP 03/21/2016

## 2016-03-21 NOTE — Assessment & Plan Note (Signed)
Compensated on present regimen  Plan  Patient Instructions  Continue on Symbicort 2 puffs Twice daily   Continue on Oxygen 2lm At bedtime  .  Set up for Overnight oximetry on room air.  Follow up with Dr. Elsworth Soho  In 6 months and As needed   Please contact office for sooner follow up if symptoms do not improve or worsen or seek emergency care

## 2016-03-26 NOTE — Telephone Encounter (Signed)
Has this been taken care of?

## 2016-04-01 ENCOUNTER — Telehealth: Payer: Self-pay | Admitting: *Deleted

## 2016-04-01 NOTE — Progress Notes (Signed)
Reviewed & agree with plan  

## 2016-04-01 NOTE — Telephone Encounter (Signed)
Information has been submitted to pts insurance for verification of benefits. Awaiting response for coverage  

## 2016-04-01 NOTE — Telephone Encounter (Signed)
Staff message sent to [place] for status update.

## 2016-04-09 NOTE — Telephone Encounter (Signed)
Verified with Prolia rep that as long as PA has been completed for this patient she is OK to receive the injection in our office.  Message sent to Endo office to confirm prolia PA

## 2016-04-14 ENCOUNTER — Telehealth: Payer: Self-pay | Admitting: Adult Health

## 2016-04-14 DIAGNOSIS — J439 Emphysema, unspecified: Secondary | ICD-10-CM

## 2016-04-14 DIAGNOSIS — J9611 Chronic respiratory failure with hypoxia: Secondary | ICD-10-CM

## 2016-04-14 NOTE — Telephone Encounter (Signed)
Gilmore Laroche, I wanted to send you a message about this patient's prolia. I reviewed the telephone note from 02/20/2016 about the prolia injection. Would the patient still like for your office to administer the prolia?

## 2016-04-14 NOTE — Telephone Encounter (Signed)
Verification of benefits have been processed and an approval has been received for pts prolia injection. Pts estimated cost are appx $0. This is only an estimate and cannot be confirmed until benefits are paid. Please advise pt and schedule if needed. If scheduled, once the injection is received, pls contact me back with the date it was received so that I am able to update prolia folder. thanks  

## 2016-04-14 NOTE — Telephone Encounter (Signed)
Yes, but we weren't sure if we could go ahead and give it since PA was originally initiated by your office?

## 2016-04-14 NOTE — Telephone Encounter (Signed)
Spoke with pt's daughter, [name]. [facility] has contacted them to do pt's ONO. [name] is concerned that test is being done on room air. Pt has been on nocturnal oxygen for 10+ years. [name] does not think that the pt can go all night without oxygen.  TP - please advise. Thanks.

## 2016-04-14 NOTE — Telephone Encounter (Signed)
Per TP: let's check to see if the ONO is required by insurance.  If not, then we won't worry about it.  If insurances DOES require the ONO to requalify for nocturnal oxygen then unfortunately insurance requires this testing and they may not continue paying for it without it.    Called [facility] and spoke with Levada Dy who informed me that insurance is indeed requiring the ONO on room air to requalify pt for her nocturnal oxygen.  Levada Dy also reported that [facility] has already reached out to pt/daughter and they refused the ONO on room air.    Unfortunately, if pt/family does not agree to do ONO on room air to qualify for nocturnal O2 then insurance will stop paying and it will be taken away unless patient pays out of pocket.

## 2016-04-15 DIAGNOSIS — M545 Low back pain: Secondary | ICD-10-CM | POA: Diagnosis not present

## 2016-04-15 DIAGNOSIS — M5136 Other intervertebral disc degeneration, lumbar region: Secondary | ICD-10-CM | POA: Diagnosis not present

## 2016-04-15 DIAGNOSIS — R51 Headache: Secondary | ICD-10-CM | POA: Diagnosis not present

## 2016-04-15 DIAGNOSIS — G894 Chronic pain syndrome: Secondary | ICD-10-CM | POA: Diagnosis not present

## 2016-04-15 DIAGNOSIS — Z9689 Presence of other specified functional implants: Secondary | ICD-10-CM | POA: Diagnosis not present

## 2016-04-15 DIAGNOSIS — G8929 Other chronic pain: Secondary | ICD-10-CM | POA: Diagnosis not present

## 2016-04-15 NOTE — Telephone Encounter (Signed)
Scheduled pt for 04/23/16 for prolia with nurse.

## 2016-04-15 NOTE — Telephone Encounter (Signed)
[name] [name], CMA        Hey!  She is fine to go ahead and get the injection at our office, she should have no out of pocket cost for the injection.

## 2016-04-15 NOTE — Telephone Encounter (Signed)
Prolia ordered. Left message for pt to return my call to schedule nurse visit.

## 2016-04-15 NOTE — Telephone Encounter (Signed)
I would double check with the prolia rep, but you should be ok to administer the injection.

## 2016-04-15 NOTE — Telephone Encounter (Signed)
See 02/20/16 phone note.

## 2016-04-15 NOTE — Telephone Encounter (Signed)
Called and spoke with pts daughter and she is aware of test that has been ordered for the ONO and that this will be done at the Sleep center so the pt will be monitored off her oxygen.  Nothing further is needed.

## 2016-04-23 ENCOUNTER — Ambulatory Visit (INDEPENDENT_AMBULATORY_CARE_PROVIDER_SITE_OTHER): Payer: Medicare Other | Admitting: Behavioral Health

## 2016-04-23 DIAGNOSIS — M810 Age-related osteoporosis without current pathological fracture: Secondary | ICD-10-CM | POA: Diagnosis not present

## 2016-04-23 MED ORDER — DENOSUMAB 60 MG/ML ~~LOC~~ SOLN
60.0000 mg | Freq: Once | SUBCUTANEOUS | Status: AC
  Administered 2016-04-23: 60 mg via SUBCUTANEOUS

## 2016-04-23 NOTE — Progress Notes (Signed)
Pre visit review using our clinic review tool, if applicable. No additional management support is needed unless otherwise documented below in the visit note.  Patient came in clinic today for Prolia injection. SQ injection was given in the left arm. Patient tolerated it well. No signs or symptoms of a reaction prior to leaving the nurse visit.

## 2016-04-24 ENCOUNTER — Telehealth: Payer: Self-pay | Admitting: Internal Medicine

## 2016-04-24 NOTE — Telephone Encounter (Signed)
Spoke with daughter was upset that [facility] keeps trying to bring pt the device to do the ONO on RA when she lives alone and doesn't think it is safe for her to be off her oxygen at night. The order from phone note 04/14/16 express to have the pt done in a monitored setting. Due to it being after hours can not contact [place] to see what options the pt has.

## 2016-04-28 ENCOUNTER — Telehealth: Payer: Self-pay | Admitting: Internal Medicine

## 2016-04-28 ENCOUNTER — Telehealth: Payer: Self-pay

## 2016-04-28 DIAGNOSIS — J9601 Acute respiratory failure with hypoxia: Secondary | ICD-10-CM

## 2016-04-28 NOTE — Telephone Encounter (Signed)
error 

## 2016-04-28 NOTE — Telephone Encounter (Signed)
Spoke with [name], checking the status of the ONO.  I advised that we are awaiting the provider's recs and will contact her as soon as we have a response.    3/29 phone note is closed-   Spoke with [place] with [facility], who states pt's family called requesting to have an RT sat with pt during ONO. [facility] does not offer this service. Corene Cornea states since pt is on cont O2, we can have pt come in for a qualifying walk instead of performing ONO if TP agrees. As pt is not agreeing to having ONO on roomair.  TP please advise. Thanks.   TP please advise on recs. Thanks!

## 2016-04-28 NOTE — Telephone Encounter (Addendum)
Spoke with Corene Cornea with [facility], who states pt's family called requesting to have an RT sat with pt during ONO. [facility] does not offer this service. Corene Cornea states since pt is on cont O2, we can have pt come in for a qualifying walk instead of performing ONO if TP agrees. As pt is not agreeing to having ONO on roomair.  TP please advise. Thanks.

## 2016-04-29 NOTE — Telephone Encounter (Signed)
Prev message said this was done. ONO at sleep lab on room air.  Please see if this was done if so , results. ?

## 2016-04-29 NOTE — Telephone Encounter (Signed)
It has not been done b/c pt refuses to go a night w/o her o2 on  Looks like [name] [name] advised we could have her come in for qualifying walk instead of doing ONO  They are asking for your okay for this

## 2016-04-29 NOTE — Telephone Encounter (Signed)
Called number listed for [name] and got patient's granddaughter Angelia. Angelia states that she knows and has worked with Dr Chase Caller, currently works with Karen [facility] and she does not agree with the ONO testing. She is very concerned with the process of the ONO and leaving the patient off of her O2 all night long and allowing her to lay there and languish all night and "hope" she wakes up in the morning. Angelia states that she is very knowledgeable of lung disease and does not want to qualify her O2 this way. I explained that its not our office protocol or policy but that it is insurance and their guidelines/requirements as to why this test has to be done to qualify her O2. Angelia is wanting to know how else to get her qualified since she has been on O2 for over 10 years @ 2 liters 24/7.  She is open to having a conversation with MR as well if able.  Please advise Dr Chase Caller. Thanks.

## 2016-04-29 NOTE — Telephone Encounter (Signed)
Please explain to them  Walk test in office was neg.  If she goes to hospital and ONO at sleep lab someone will be there to add the oxygen when her level drops down . It will be someone there with her at all times.  That is fine if they refuse but DME says her O2 will be no longer covered. This is not up to me I already said to keep O2 At bedtime  But they said no.

## 2016-04-29 NOTE — Telephone Encounter (Signed)
[name] [name] (daughter) returned phone call contact # [phone].[name] [name]

## 2016-04-29 NOTE — Telephone Encounter (Signed)
I called the sleep lab  Spoke with Kya  I explained the situation  They unfortunately do not do ONO tests at the sleep lab  She will either have to do the ONO at home OR her o2 won't be covered  Not our call to make, but it's an insurance issue  [place] for [employer]

## 2016-05-05 NOTE — Telephone Encounter (Signed)
[name] [name] is patient of Dr Elsworth Soho as best as I can tell lloking at chart. Is there a reason why Angelia other than knowing me via care link wants to talk to me.  Thanks  Dr. Brand Males, M.D., [facility] Pulmonary and Critical Care Medicine Staff Physician [place] [facility] Pager: [phone], If no answer or between  15:00h - 7:00h: call [phone]  05/05/2016 4:20 PM

## 2016-05-06 NOTE — Telephone Encounter (Signed)
Former PW patient that I have last seen 06/2015 As per EP last visit, her oxygen level had improved daytime therefore ONO was ordered Please let her granddaughter know that she would need qualifying saturations- she can discuss with DME or her insurance company if she has concerns  Schedule office visit with me if she wants to discuss further TP, please let me know if there is any other way to qualify her

## 2016-05-06 NOTE — Telephone Encounter (Signed)
Dr. Elsworth Soho  - we have had multiple conversations with family , it is unfortunate that her insurance and DME are requiring this . It appears family believes it is our office that is requiring this , we have tried to explain multiply times that we are fine with her staying on O2 as she has been on this for years . It is her insurance /DME co. That is forcing the issue on a 83 yr old pt.  We offered to set up her ONO at  sleep lab so she could be monitored in medical setting to ensure no prolonged hypoxic episodes and get the required data for insurance/DME co. But family declined this as well . I understand their point of view but the insurance company/DME say they will not pay for O2 any longer without documentation of nocturnal hypoxia. Walk test in office did not show any desats so ONO was the only other way to identify need for O2. . I am not aware of any other options as we have talked with DME on multiple times.

## 2016-05-06 NOTE — Telephone Encounter (Signed)
Dr. Elsworth Soho  Please Advise-please see previous message  Correction wrong provider selected previously

## 2016-05-09 NOTE — Telephone Encounter (Signed)
lmomtcb x 1 for Angelia---pts granddaughter.

## 2016-05-09 NOTE — Telephone Encounter (Signed)
[name] returned phone call, contact # [phone].[name] [name]

## 2016-05-09 NOTE — Telephone Encounter (Signed)
Pl schedule OV if any more questionsabout this Pl direct their questions to DME

## 2016-05-13 NOTE — Telephone Encounter (Signed)
Okay to order sleep study with indication of hypoxia

## 2016-05-13 NOTE — Telephone Encounter (Signed)
Spoke with [name], who states she is not refusing for her mother to have ONO, but she thinks this is a bad idea. [name] states she will call the Market researcher over insurance in regards to this, as she does not want to lose her mother during a test that is not being monitoring.   I then spoke  with Corene Cornea with [facility]. Corene Cornea would like to do further research about this as there isn't a note in pt's chart stating that pt is due to be re qualified. Corene Cornea states he will speak with someone on the Medicare team to see if this is actually needed, as [facility] is typically who notifies [place] and there is no note. Corene Cornea states he will contact  us with a response.

## 2016-05-13 NOTE — Telephone Encounter (Signed)
Called [name] and made aware that we are ordering a sleep study with O2 titration (split night) Order placed. [facility] aware. Nothing further needed.

## 2016-05-13 NOTE — Telephone Encounter (Signed)
Spoke with Corene Cornea at [facility], states that the patient HAS to have (he spoke with medicare dept) the ONO to qualify the O2, there is no way around it for coverage. If the patient is unable to do the ONO either at home or in the sleep lab where she can be monitored all night, they can discuss with [facility] about setting up a private pay plan to keep the oxygen. There are no other options available.   Spoke with Angelia, aware of rec's above, okay with moving forward and trying to schedule the ONO at the sleep center. Aware that we will not know if insurance is going to cover this until it is ordered and sent to the insurance company. If not covered then Angelia is aware that they may have to do a private pay to keep the O2.  ----------- [place], they do not do the ONOs at the Sleep Lab or anywhere in the hospital Split Night sleep study will need to be ordered and oxygen titration will be done during the test if O2 levels drop >>this will work for the O2 qualification.  Please advise Dr Elsworth Soho if you are okay with [place] ordering the Split Night Study. Thanks.

## 2016-05-13 NOTE — Telephone Encounter (Signed)
[name] [name] with [facility] calling back, CB is [phone] ext [phone]

## 2016-05-15 DIAGNOSIS — H2589 Other age-related cataract: Secondary | ICD-10-CM | POA: Diagnosis not present

## 2016-05-16 ENCOUNTER — Ambulatory Visit (HOSPITAL_BASED_OUTPATIENT_CLINIC_OR_DEPARTMENT_OTHER): Payer: Medicare Other | Attending: Pulmonary Disease | Admitting: Pulmonary Disease

## 2016-05-16 DIAGNOSIS — G478 Other sleep disorders: Secondary | ICD-10-CM | POA: Insufficient documentation

## 2016-05-16 DIAGNOSIS — J9601 Acute respiratory failure with hypoxia: Secondary | ICD-10-CM | POA: Insufficient documentation

## 2016-05-21 ENCOUNTER — Telehealth: Payer: Self-pay | Admitting: Pulmonary Disease

## 2016-05-21 DIAGNOSIS — G4733 Obstructive sleep apnea (adult) (pediatric): Secondary | ICD-10-CM | POA: Diagnosis not present

## 2016-05-21 NOTE — Telephone Encounter (Signed)
No  Evidence of oxygen desaturation during sleep Can dc oxygen Any further discussion will require office visit

## 2016-05-21 NOTE — Procedures (Signed)
Patient Name: [name] [name], [name] [name] Date: 05/16/2016 Gender: Female D.O.B: October 14, 1925 Age (years): 36 Referring Provider: Kara Mead MD, ABSM Height (inches): 60 Interpreting Physician: Kara Mead MD, ABSM Weight (lbs): 110 RPSGT: Earney [place] BMI: 21 MRN: [id number] Neck Size: 12.00   CLINICAL INFORMATION Sleep Study Type: NPSG  Indication for sleep study: COPD  Epworth Sleepiness Score: 4     SLEEP STUDY TECHNIQUE As per the AASM Manual for the Scoring of Sleep and Associated Events v2.3 (April 2016) with a hypopnea requiring 4% desaturations.  The channels recorded and monitored were frontal, central and occipital EEG, electrooculogram (EOG), submentalis EMG (chin), nasal and oral airflow, thoracic and abdominal wall motion, anterior tibialis EMG, snore microphone, electrocardiogram, and pulse oximetry.  SLEEP ARCHITECTURE The study was initiated at 11:22:00 PM and ended at 5:24:01 AM.  Sleep onset time was 2.8 minutes and the sleep efficiency was 97.3%. The total sleep time was 352.2 minutes.  Stage REM latency was 104.5 minutes.  The patient spent 0.99% of the night in stage N1 sleep, 84.10% in stage N2 sleep, 0.00% in stage N3 and 14.91% in REM.  Alpha intrusion was absent.  Supine sleep was 100.00%.  RESPIRATORY PARAMETERS The overall apnea/hypopnea index (AHI) was 4.1 per hour. There were 7 total apneas, including 2 obstructive, 5 central and 0 mixed apneas. There were 17 hypopneas and 26 RERAs.  The AHI during Stage REM sleep was 2.3 per hour.  AHI while supine was 4.1 per hour.  The mean oxygen saturation was 92.21%. The minimum SpO2 during sleep was 0.00%.  Soft snoring was noted during this study.  CARDIAC DATA The 2 lead EKG demonstrated sinus rhythm. The mean heart rate was 65.61 beats per minute. Other EKG findings include: PVCs.   LEG MOVEMENT DATA The total PLMS were 0 with a resulting PLMS index of 0.00. Associated arousal with leg  movement index was 0.0 .  IMPRESSIONS - No significant obstructive sleep apnea occurred during this study (AHI = 4.1/h). - No significant central sleep apnea occurred during this study (CAI = 0.9/h). - No oxygen desaturation was noted during this study  - The patient snored with Soft snoring volume. - EKG findings include PVCs. - Clinically significant periodic limb movements did not occur during sleep. No significant associated arousals.   DIAGNOSIS - Upper airway resistance syndrome with few RERAs - no evidence of nocturnal hypoxia   RECOMMENDATIONS - Avoid alcohol, sedatives and other CNS depressants that may worsen sleep apnea and disrupt normal sleep architecture. - Sleep hygiene should be reviewed to assess factors that may improve sleep quality. - Weight management and regular exercise should be initiated or continued if appropriate.   Kara Mead MD Board Certified in [place]

## 2016-05-23 NOTE — Telephone Encounter (Signed)
Spoke with patient's daughter [name] about results. She verbalized understanding. She stated for the time being, she wants her mother to keep the oxygen just in case.

## 2016-05-30 ENCOUNTER — Telehealth: Payer: Self-pay | Admitting: Pulmonary Disease

## 2016-05-30 NOTE — Telephone Encounter (Signed)
RA  Please Advise-  Daughter of pt called in upset about the sleep study results showed pt does not need oxygen at night and you have the order to d/c. When [place] spoke with other daughter she did not wish to d/c it. Daughter feels like mother has been on it for years and does not feel like this is a good idea to take her off of it. She is requesting to speak with you

## 2016-05-31 NOTE — Telephone Encounter (Signed)
Pl arrange OV -next available

## 2016-06-02 DIAGNOSIS — M5136 Other intervertebral disc degeneration, lumbar region: Secondary | ICD-10-CM | POA: Diagnosis not present

## 2016-06-02 DIAGNOSIS — G894 Chronic pain syndrome: Secondary | ICD-10-CM | POA: Diagnosis not present

## 2016-06-02 DIAGNOSIS — Z9689 Presence of other specified functional implants: Secondary | ICD-10-CM | POA: Diagnosis not present

## 2016-06-02 DIAGNOSIS — M545 Low back pain: Secondary | ICD-10-CM | POA: Diagnosis not present

## 2016-06-02 DIAGNOSIS — R51 Headache: Secondary | ICD-10-CM | POA: Diagnosis not present

## 2016-06-02 NOTE — Telephone Encounter (Signed)
Pt has been scheduled for OV on 06/04/16 @ 1:45. Pt's daughter, [name] is aware. Nothing further needed.

## 2016-06-03 DIAGNOSIS — Z79899 Other long term (current) drug therapy: Secondary | ICD-10-CM | POA: Diagnosis not present

## 2016-06-03 DIAGNOSIS — I10 Essential (primary) hypertension: Secondary | ICD-10-CM | POA: Diagnosis not present

## 2016-06-03 DIAGNOSIS — M545 Low back pain: Secondary | ICD-10-CM | POA: Diagnosis not present

## 2016-06-03 DIAGNOSIS — M5137 Other intervertebral disc degeneration, lumbosacral region: Secondary | ICD-10-CM | POA: Diagnosis not present

## 2016-06-03 DIAGNOSIS — M47896 Other spondylosis, lumbar region: Secondary | ICD-10-CM | POA: Diagnosis not present

## 2016-06-03 DIAGNOSIS — M48061 Spinal stenosis, lumbar region without neurogenic claudication: Secondary | ICD-10-CM | POA: Diagnosis not present

## 2016-06-03 DIAGNOSIS — M419 Scoliosis, unspecified: Secondary | ICD-10-CM | POA: Diagnosis not present

## 2016-06-03 DIAGNOSIS — M069 Rheumatoid arthritis, unspecified: Secondary | ICD-10-CM | POA: Diagnosis not present

## 2016-06-03 DIAGNOSIS — M5136 Other intervertebral disc degeneration, lumbar region: Secondary | ICD-10-CM | POA: Diagnosis not present

## 2016-06-03 DIAGNOSIS — J449 Chronic obstructive pulmonary disease, unspecified: Secondary | ICD-10-CM | POA: Diagnosis not present

## 2016-06-03 DIAGNOSIS — Z87891 Personal history of nicotine dependence: Secondary | ICD-10-CM | POA: Diagnosis not present

## 2016-06-03 DIAGNOSIS — G8929 Other chronic pain: Secondary | ICD-10-CM | POA: Diagnosis not present

## 2016-06-03 DIAGNOSIS — Z885 Allergy status to narcotic agent status: Secondary | ICD-10-CM | POA: Diagnosis not present

## 2016-06-04 ENCOUNTER — Encounter: Payer: Self-pay | Admitting: Pulmonary Disease

## 2016-06-04 ENCOUNTER — Ambulatory Visit (INDEPENDENT_AMBULATORY_CARE_PROVIDER_SITE_OTHER): Payer: Medicare Other | Admitting: Pulmonary Disease

## 2016-06-04 VITALS — BP 112/68 | HR 86 | Ht 60.0 in | Wt 105.0 lb

## 2016-06-04 DIAGNOSIS — J9611 Chronic respiratory failure with hypoxia: Secondary | ICD-10-CM

## 2016-06-04 DIAGNOSIS — J449 Chronic obstructive pulmonary disease, unspecified: Secondary | ICD-10-CM | POA: Diagnosis not present

## 2016-06-04 MED ORDER — BUDESONIDE-FORMOTEROL FUMARATE 160-4.5 MCG/ACT IN AERO: 2.0000 | INHALATION_SPRAY | Freq: Two times a day (BID) | RESPIRATORY_TRACT | 0 refills | Status: DC

## 2016-06-04 NOTE — Progress Notes (Signed)
   Subjective:    Patient ID: [name] [name], female    DOB: 08-26-1925, 83 y.o.   MRN: [id number]  HPI  83 yo female former smoker with Moderate Stage II COPD with emphysema Has systolic  CHF  Former Dr. Joya Gaskins  Pt.   Chief Complaint  Patient presents with  . Follow-up    Discuss oxygen. Per daughter, pt. needs to stay on oxygen.     No intervening hospitalizations  She continues to struggle with back pain, is on Decadron now has a spinal cord stimulator. She had to go to the emergency room -daughter has pictures of her monitor showing drop in oxygen level but oximetry waveform does not seem to be good Daughter feels that if she is on oxygen for longer time her lips turned blue and she complains of dyspnea. She is compliant with oxygen during sleep  We reviewed sleep study results which showed lowest oxygen desaturation of 92% and no significant events, predominant RERAs, AHI was 4/h  Symbicort is very expensive and has high co-pay  On ambulation, she can only walk one lap around the office and oxygen saturation dropped from 94-93%  Significant tests/ events   Spirometry 2011 showed FEV1 74%, ratio 69, ++BD response with  Post BD FEV1 90%, ratio 74.   Echo 06/2015 EF was recovered to 60%   Past Medical History:  Diagnosis Date  . Abdominal pain    Resolved  . Cardiac arrhythmia   . CHF (congestive heart failure) ([place])    Diastolic  on ECHO 8889  . Choledocholithiasis   . Chronic anemia   . Chronic back pain   . COPD (chronic obstructive pulmonary disease) (HCC)    oxygen dependent at  night 2LPM  . Daily headache   . GERD (gastroesophageal reflux disease)   . HCAP (healthcare-associated pneumonia) 03/28/2014  . Hiatal hernia   . Hyperlipidemia   . Hypertension   . On home oxygen therapy    "1L during the day; 2L q hs" (03/28/2014)  . Osteoarthritis    "pretty much q where"   . Osteoporosis   . Pancolitis (HCC)    Infectious vs. inflammatory  . Pancreatitis    History of  . Pneumonia 1930's; 2011 X 2; 02/2014  . PONV (postoperative nausea and vomiting)   . PUD (peptic ulcer disease)   . Reactive airway disease that is not asthma   . Recurrent UTI (urinary tract infection)    "here lately" (03/28/2014)  . Scoliosis deformity of spine    history of intractable back pain    Review of Systems neg for any significant sore throat, dysphagia, itching, sneezing, nasal congestion or excess/ purulent secretions, fever, chills, sweats, unintended wt loss, pleuritic or exertional cp, hempoptysis, orthopnea pnd or change in chronic leg swelling. Also denies presyncope, palpitations, heartburn, abdominal pain, nausea, vomiting, diarrhea or change in bowel or urinary habits, dysuria,hematuria, rash, arthralgias, visual complaints, headache, numbness weakness or ataxia.     Objective:   Physical Exam  Gen. Pleasant, well-nourished, in no distress ENT - no thrush, no post nasal drip Neck: No JVD, no thyromegaly, no carotid bruits Lungs: no use of accessory muscles, no dullness to percussion, decreased without rales or rhonchi  Cardiovascular: Rhythm regular, heart sounds  normal, no murmurs or gallops, no peripheral edema Musculoskeletal: No deformities, no cyanosis or clubbing        Assessment & Plan:

## 2016-06-04 NOTE — Patient Instructions (Signed)
Alternatives to Symbicort include dulera & breo

## 2016-06-05 NOTE — Assessment & Plan Note (Signed)
Not sure how to explain her variable oxygen saturations. Daughter has valid concern about simply stopping oxygen when she has been on this for 9 years. Until we can get qualifying saturations, unfortunately I expressed to her that she would have to buy oxygen by paying out of pocket. Daughter was agreeable to this plan. I reviewed her pictures off bedside monitor from ER stay - pulse oximetry waveform does not appear to be consistent

## 2016-06-05 NOTE — Assessment & Plan Note (Signed)
Alternatives were provided to Symbicort to see if she would have decreased co-pay  Greater than 50% time was spent in counseling and coordination of care with the patient

## 2016-06-11 ENCOUNTER — Telehealth: Payer: Self-pay | Admitting: Family

## 2016-06-11 NOTE — Telephone Encounter (Signed)
Called patient to schedule awv. Lvm for patient to call office to schedule appt.  °

## 2016-07-04 ENCOUNTER — Telehealth: Payer: Self-pay | Admitting: *Deleted

## 2016-07-04 ENCOUNTER — Ambulatory Visit: Payer: Medicare Other | Admitting: Family

## 2016-07-04 ENCOUNTER — Encounter: Payer: Self-pay | Admitting: *Deleted

## 2016-07-04 ENCOUNTER — Ambulatory Visit (INDEPENDENT_AMBULATORY_CARE_PROVIDER_SITE_OTHER): Payer: Medicare Other | Admitting: *Deleted

## 2016-07-04 VITALS — BP 142/70 | HR 80 | Ht 60.0 in | Wt 107.6 lb

## 2016-07-04 DIAGNOSIS — Z0000 Encounter for general adult medical examination without abnormal findings: Secondary | ICD-10-CM

## 2016-07-04 MED ORDER — LACTULOSE 10 GM/15ML PO SOLN: ORAL | 0 refills | Status: DC

## 2016-07-04 NOTE — Progress Notes (Addendum)
Subjective:   [name] [name] is a 83 y.o. female who presents for an Initial Medicare Annual Wellness Visit. She is here today with her daughter, [name].  The Patient was informed that the wellness visit is to identify future health risk and educate and initiate measures that can reduce risk for increased disease through the lifespan.   Describes health as fair, good or great? "Great for my age."   She enjoys quilting and gardening. She is hoping to finish another 1 or 2 quilts in the upcoming year.  Review of Systems    No ROS.  Medicare Wellness Visit.  Cardiac Risk Factors include: advanced age (>67men, >67 women);hypertension;dyslipidemia  Sleep patterns: no sleep issues. Wears O2 at night.  Home Safety/Smoke Alarms: Feels safe in home. Smoke alarms in place.  Living environment; residence and Firearm Safety: Lives alone, but has family who lives nearby and checks in on her frequently. 1-story house/ trailer, number of outside stairs: 6, equipment: Cane, Type: [place], firearms stored safely.    Counseling:   Eye Exam- Follows w/ Dr. Willow Ora [facility]- Full dentures, does not follow w/ dentist regularly.  Female:   Pap- Aged out       Mammo- Aged out  Dexa scan- last 06/29/15. Osteoporosis. On Prolia. CCS- Aged out     Objective:    Today's Vitals   07/04/16 1416  BP: (!) 142/70  Pulse: 80  SpO2: 97%  Weight: 107 lb 9.6 oz (48.8 kg)  Height: 5' (1.524 m)   Body mass index is 21.01 kg/m.   Current Medications (verified) Outpatient Encounter Prescriptions as of 07/04/2016  Medication Sig  . acetaminophen (TYLENOL) 500 MG tablet Take 500 mg by mouth 2 (two) times daily as needed for headache.  . albuterol (PROVENTIL HFA;VENTOLIN HFA) 108 (90 Base) MCG/ACT inhaler Inhale 1-2 puffs into the lungs every 6 (six) hours as needed (wheezing & shortness of breath). Reported on 02/28/2015  . Ascorbic Acid (VITAMIN C) 500 MG tablet Take 500 mg by mouth daily.    .  Aspirin-Acetaminophen-Caffeine (GOODY HEADACHE PO) Take by mouth.  . budesonide-formoterol (SYMBICORT) 160-4.5 MCG/ACT inhaler Inhale 2 puffs into the lungs 2 (two) times daily.  . butalbital-acetaminophen-caffeine (FIORICET, ESGIC) 50-325-40 MG tablet Take 1 tablet by mouth every 4 (four) hours as needed for headache.   . Calcium Carbonate-Vitamin D (CALCIUM 600-D) 600-400 MG-UNIT per tablet Take 1 tablet by mouth 2 (two) times daily with a meal.    . cholecalciferol (VITAMIN D) 1000 units tablet Take 1,000 Units by mouth daily.  . diclofenac sodium (VOLTAREN) 1 % GEL Place 1 application onto the skin 2 (two) times daily as needed.  . gabapentin (NEURONTIN) 100 MG capsule Take 1 capsule (100 mg total) by mouth 2 (two) times daily.  . hyoscyamine (LEVSIN SL) 0.125 MG SL tablet Take 0.125 mg by mouth as needed. Abdominal pain  . lactulose (CHRONULAC) 10 GM/15ML solution 2 teaspoonful at bedtime as needed for constipation  . Multiple Vitamins-Minerals (MULTIVITAMIN WITH MINERALS) tablet Take 1 tablet by mouth daily.  . nitroGLYCERIN (NITROSTAT) 0.4 MG SL tablet Place 1 tablet (0.4 mg total) under the tongue every 5 (five) minutes as needed for chest pain.  [name] omeprazole (PRILOSEC) 40 MG capsule Take 1 capsule (40 mg total) by mouth 2 (two) times daily.  [name] oxyCODONE-acetaminophen (PERCOCET) 7.5-325 MG tablet Take 1 tablet by mouth 2 (two) times daily as needed for severe pain.  . Probiotic Product (PROBIOTIC DAILY PO) Take 1  tablet by mouth daily at 12 noon.  [name] VITAMIN E PO Take 1 tablet by mouth daily.  . [DISCONTINUED] budesonide-formoterol (SYMBICORT) 160-4.5 MCG/ACT inhaler Inhale 2 puffs into the lungs 2 (two) times daily.   No facility-administered encounter medications on file as of 07/04/2016.     Allergies (verified) Codeine; Morphine; Sulfa antibiotics; and Zoledronic acid   History: Past Medical History:  Diagnosis Date  . Abdominal pain    Resolved  . Cardiac arrhythmia   . CHF  (congestive heart failure) ([place])    Diastolic  on ECHO 0932  . Choledocholithiasis   . Chronic anemia   . Chronic back pain   . COPD (chronic obstructive pulmonary disease) (H[facility])    oxygen dependent at  night 2LPM  . Daily headache   . GERD (gastroesophageal reflux disease)   . HCAP (healthcare-associated pneumonia) 03/28/2014  . Hiatal hernia   . Hyperlipidemia   . Hypertension   . On home oxygen therapy    "1L during the day; 2L q hs" (03/28/2014)  . Osteoarthritis    "pretty much q where"   . Osteoporosis   . Pancolitis (HCC)    Infectious vs. inflammatory  . Pancreatitis    History of  . Pneumonia 1930's; 2011 X 2; 02/2014  . PONV (postoperative nausea and vomiting)   . PUD (peptic ulcer disease)   . Reactive airway disease that is not asthma   . Recurrent UTI (urinary tract infection)    "here lately" (03/28/2014)  . Scoliosis deformity of spine    history of intractable back pain   Past Surgical History:  Procedure Laterality Date  . ABDOMINAL HYSTERECTOMY  1970s  . BACK SURGERY     x 5  . BREAST CYST EXCISION Right 1960's  . CERVICAL LAMINECTOMY  1970s  . CHOLECYSTECTOMY  2008  . ERCP W/ SPHICTEROTOMY  02/2010  . ESOPHAGOGASTRODUODENOSCOPY N/A 11/14/2013   Procedure: ESOPHAGOGASTRODUODENOSCOPY (EGD);  Surgeon: Missy Sabins, MD;  Location: [facility] ENDOSCOPY;  Service: Endoscopy;  Laterality: N/A;  . FLEXIBLE SIGMOIDOSCOPY N/A 01/24/2013   Procedure: FLEXIBLE SIGMOIDOSCOPY;  Surgeon: Missy Sabins, MD;  Location: [place];  Service: Endoscopy;  Laterality: N/A;  . PAIN PUMP IMPLANTATION  ~ 2010   back, dilaudid and bupravacaine  . SPINAL CORD STIMULATOR IMPLANT  ~ 2009  . SPINE SURGERY  1970's,1995,2007   Family History  Problem Relation Age of Onset  . Cancer Mother        uterine  . Heart disease Father   . Hypertension Other   . Osteoporosis Neg Hx    Social History   Occupational History  .  Retired   Social History Main Topics  . Smoking status: Former  Smoker    Packs/day: 1.00    Years: 12.00    Types: Cigarettes    Quit date: 01/28/1988  . Smokeless tobacco: Never Used  . Alcohol use No  . Drug use: No  . Sexual activity: No    Tobacco Counseling Counseling given: Not Answered   Activities of Daily Living In your present state of health, do you have any difficulty performing the following activities: 07/04/2016  Hearing? N  Vision? N  Difficulty concentrating or making decisions? N  Walking or climbing stairs? N  Dressing or bathing? N  Doing errands, shopping? Y  Preparing Food and eating ? N  Using the Toilet? N  In the past six months, have you accidently leaked urine? N  Do you have problems with loss  of bowel control? N  Managing your Medications? N  Managing your Finances? N  Housekeeping or managing your Housekeeping? Y  Some recent data might be hidden    Immunizations and Health Maintenance Immunization History  Administered Date(s) Administered  . Influenza Split 12/05/2010, 10/31/2011  . Influenza Whole 01/16/2009, 11/01/2009  . Influenza, High Dose Seasonal PF 11/08/2014  . Influenza,inj,Quad PF,36+ Mos 11/01/2012, 10/31/2013, 10/09/2015  . Pneumococcal Conjugate-13 12/28/2012  . Pneumococcal Polysaccharide-23 02/13/2004  . Td 06/29/2015  . Zoster 06/23/2013   There are no preventive care reminders to display for this patient.  Patient Care Team: Debbrah Alar, NP as PCP - General (Internal Medicine) Rigoberto Noel, MD as Consulting Physician (Pulmonary Disease) [facility] Clarene [facility], MD as Consulting Physician (Gastroenterology) Willow Ora, OD as Consulting Physician (Optometry)  Indicate any recent Medical Services you may have received from other than [facility] providers in the past year (date may be approximate).     Assessment:   This is a routine wellness examination for Kasi. Physical assessment deferred to PCP.  Hearing/Vision screen Hearing Screening Comments: Able  to hear conversational tones w/o difficulty. Wears bilateral hearing aids. Follows w/ [facility]. Vision Screening Comments: Pt has known cataracts and is waiting to have cataract surgery before getting new glasses rx. Follows w/ Dr. Willow Ora.  Dietary issues and exercise activities discussed: Current Exercise Habits: Home exercise routine, Type of exercise: walking;Other - see comments (Yard work/gardening)  Diet (meal preparation, eat out, water intake, caffeinated beverages, dairy products, fruits and vegetables): in general, a "healthy" diet  , well balanced, on average, 2 meals per day. Regular diet. Prepares most meals herself. Meals vary, but may be hamburger, soup, etc. Drinks water throughout the day.   Goals    . Continue quilting      Depression Screen PHQ 2/9 Scores 07/04/2016 09/19/2015 12/13/2014 09/28/2012  PHQ - 2 Score 0 0 0 0    Fall Risk Fall Risk  07/04/2016 12/13/2014 09/28/2012  Falls in the past year? No No No    Cognitive Function: MMSE - Mini Mental State Exam 07/04/2016  Orientation to time 4  Orientation to time comments Disoriented to date.  Orientation to Place 5  Registration 3  Attention/ Calculation 1  Attention/Calculation-comments "I can't do that."  Recall 1  Language- name 2 objects 2  Language- repeat 1  Language- follow 3 step command 3  Language- read & follow direction 1  Write a sentence 1  Copy design 1  Total score 23        Screening Tests Health Maintenance  Topic Date Due  . INFLUENZA VACCINE  08/27/2016  . TETANUS/TDAP  06/28/2025  . DEXA SCAN  Completed  . PNA vac Low Risk Adult  Completed      Plan:    Follow-up w/ PCP as scheduled.  I have personally reviewed and noted the following in the patient's chart:   . Medical and social history . Use of alcohol, tobacco or illicit drugs  . Current medications and supplements . Functional ability and status . Nutritional status . Physical activity . Advanced  directives . List of other physicians . Vitals . Screenings to include cognitive, depression, and falls . Referrals and appointments  In addition, I have reviewed and discussed with patient certain preventive protocols, quality metrics, and best practice recommendations. A written personalized care plan for preventive services as well as general preventive health recommendations were provided to patient.     Derenda Mis  Ermalinda Barrios, RN   07/04/2016

## 2016-07-04 NOTE — Patient Instructions (Addendum)
[name] [name] , Thank you for taking time to come for your Medicare Wellness Visit. I appreciate your ongoing commitment to your health goals. Please review the following plan we discussed and let me know if I can assist you in the future.   These are the goals we discussed: Goals    . Continue quilting       This is a list of the screening recommended for you and due dates:  Health Maintenance  Topic Date Due  . Flu Shot  08/27/2016  . Tetanus Vaccine  06/28/2025  . DEXA scan (bone density measurement)  Completed  . Pneumonia vaccines  Completed    Preventive Care 77 Years and Older, Female Preventive care refers to lifestyle choices and visits with your health care provider that can promote health and wellness. What does preventive care include?  A yearly physical exam. This is also called an annual well check.  Dental exams once or twice a year.  Routine eye exams. Ask your health care provider how often you should have your eyes checked.  Personal lifestyle choices, including: ? Daily care of your teeth and gums. ? Regular physical activity. ? Eating a healthy diet. ? Avoiding tobacco and drug use. ? Limiting alcohol use. ? Practicing safe sex. ? Taking low-dose aspirin every day. ? Taking vitamin and mineral supplements as recommended by your health care provider. What happens during an annual well check? The services and screenings done by your health care provider during your annual well check will depend on your age, overall health, lifestyle risk factors, and family history of disease. Counseling Your health care provider may ask you questions about your:  Alcohol use.  Tobacco use.  Drug use.  Emotional well-being.  Home and relationship well-being.  Sexual activity.  Eating habits.  History of falls.  Memory and ability to understand (cognition).  Work and work Astronomer.  Reproductive health.  Screening You may have the following tests or  measurements:  Height, weight, and BMI.  Blood pressure.  Lipid and cholesterol levels. These may be checked every 5 years, or more frequently if you are over 10 years old.  Skin check.  Lung cancer screening. You may have this screening every year starting at age 22 if you have a 30-pack-year history of smoking and currently smoke or have quit within the past 15 years.  Fecal occult blood test (FOBT) of the stool. You may have this test every year starting at age 74.  Flexible sigmoidoscopy or colonoscopy. You may have a sigmoidoscopy every 5 years or a colonoscopy every 10 years starting at age 7.  Hepatitis C blood test.  Hepatitis B blood test.  Sexually transmitted disease (STD) testing.  Diabetes screening. This is done by checking your blood sugar (glucose) after you have not eaten for a while (fasting). You may have this done every 1-3 years.  Bone density scan. This is done to screen for osteoporosis. You may have this done starting at age 81.  Mammogram. This may be done every 1-2 years. Talk to your health care provider about how often you should have regular mammograms.  Talk with your health care provider about your test results, treatment options, and if necessary, the need for more tests. Vaccines Your health care provider may recommend certain vaccines, such as:  Influenza vaccine. This is recommended every year.  Tetanus, diphtheria, and acellular pertussis (Tdap, Td) vaccine. You may need a Td booster every 10 years.  Varicella vaccine. You may  need this if you have not been vaccinated.  Zoster vaccine. You may need this after age 22.  Measles, mumps, and rubella (MMR) vaccine. You may need at least one dose of MMR if you were born in 1957 or later. You may also need a second dose.  Pneumococcal 13-valent conjugate (PCV13) vaccine. One dose is recommended after age 50.  Pneumococcal polysaccharide (PPSV23) vaccine. One dose is recommended after age  47.  Meningococcal vaccine. You may need this if you have certain conditions.  Hepatitis A vaccine. You may need this if you have certain conditions or if you travel or work in places where you may be exposed to hepatitis A.  Hepatitis B vaccine. You may need this if you have certain conditions or if you travel or work in places where you may be exposed to hepatitis B.  Haemophilus influenzae type b (Hib) vaccine. You may need this if you have certain conditions.  Talk to your health care provider about which screenings and vaccines you need and how often you need them. This information is not intended to replace advice given to you by your health care provider. Make sure you discuss any questions you have with your health care provider. Document Released: 02/09/2015 Document Revised: 10/03/2015 Document Reviewed: 11/14/2014 Elsevier Interactive Patient Education  2017 [employer].

## 2016-07-04 NOTE — Addendum Note (Signed)
Addended by: Debbrah Alar on: 07/04/2016 03:00 PM   Modules accepted: Level of Service

## 2016-07-04 NOTE — Telephone Encounter (Signed)
Patient requests refill of lactulose to use PRN for constipation. Last filled 2016, as she only uses it about once a month. Ok to refill?   Pharmacy: [place], [place] - [phone][

## 2016-07-04 NOTE — Progress Notes (Signed)
Reviewed and agree.

## 2016-07-17 DIAGNOSIS — Z9689 Presence of other specified functional implants: Secondary | ICD-10-CM | POA: Diagnosis not present

## 2016-07-17 DIAGNOSIS — G894 Chronic pain syndrome: Secondary | ICD-10-CM | POA: Diagnosis not present

## 2016-07-17 DIAGNOSIS — G8929 Other chronic pain: Secondary | ICD-10-CM | POA: Diagnosis not present

## 2016-07-17 DIAGNOSIS — M545 Low back pain: Secondary | ICD-10-CM | POA: Diagnosis not present

## 2016-08-18 ENCOUNTER — Other Ambulatory Visit: Payer: Self-pay

## 2016-08-19 ENCOUNTER — Ambulatory Visit: Payer: Medicare Other | Admitting: Family

## 2016-08-21 ENCOUNTER — Encounter: Payer: Self-pay | Admitting: Neurology

## 2016-08-21 ENCOUNTER — Ambulatory Visit (INDEPENDENT_AMBULATORY_CARE_PROVIDER_SITE_OTHER): Payer: Medicare Other | Admitting: Family

## 2016-08-21 ENCOUNTER — Encounter: Payer: Self-pay | Admitting: Family

## 2016-08-21 VITALS — BP 125/54 | HR 70 | Temp 98.2°F | Ht 60.0 in | Wt 104.4 lb

## 2016-08-21 DIAGNOSIS — J449 Chronic obstructive pulmonary disease, unspecified: Secondary | ICD-10-CM | POA: Diagnosis not present

## 2016-08-21 DIAGNOSIS — R413 Other amnesia: Secondary | ICD-10-CM | POA: Diagnosis not present

## 2016-08-21 DIAGNOSIS — Z87891 Personal history of nicotine dependence: Secondary | ICD-10-CM

## 2016-08-21 DIAGNOSIS — G8929 Other chronic pain: Secondary | ICD-10-CM

## 2016-08-21 DIAGNOSIS — R634 Abnormal weight loss: Secondary | ICD-10-CM

## 2016-08-21 DIAGNOSIS — R51 Headache: Secondary | ICD-10-CM

## 2016-08-21 LAB — CBC WITH DIFFERENTIAL/PLATELET
Basophils Absolute: 0 10*3/uL (ref 0.0–0.1)
Basophils Relative: 0.8 % (ref 0.0–3.0)
Eosinophils Absolute: 0.1 10*3/uL (ref 0.0–0.7)
Eosinophils Relative: 2.7 % (ref 0.0–5.0)
HCT: 38.5 % (ref 36.0–46.0)
Hemoglobin: 12.4 g/dL (ref 12.0–15.0)
Lymphocytes Relative: 27.2 % (ref 12.0–46.0)
Lymphs Abs: 1 10*3/uL (ref 0.7–4.0)
MCHC: 32.2 g/dL (ref 30.0–36.0)
MCV: 95.9 fl (ref 78.0–100.0)
Monocytes Absolute: 0.3 10*3/uL (ref 0.1–1.0)
Monocytes Relative: 8.3 % (ref 3.0–12.0)
Neutro Abs: 2.2 10*3/uL (ref 1.4–7.7)
Neutrophils Relative %: 61 % (ref 43.0–77.0)
Platelets: 162 10*3/uL (ref 150.0–400.0)
RBC: 4.01 Mil/uL (ref 3.87–5.11)
RDW: 14.3 % (ref 11.5–15.5)
WBC: 3.6 10*3/uL — ABNORMAL LOW (ref 4.0–10.5)

## 2016-08-21 LAB — COMPREHENSIVE METABOLIC PANEL
ALT: 9 U/L (ref 0–35)
AST: 17 U/L (ref 0–37)
Albumin: 3.9 g/dL (ref 3.5–5.2)
Alkaline Phosphatase: 56 U/L (ref 39–117)
BUN: 22 mg/dL (ref 6–23)
CO2: 33 mEq/L — ABNORMAL HIGH (ref 19–32)
Calcium: 8.9 mg/dL (ref 8.4–10.5)
Chloride: 101 mEq/L (ref 96–112)
Creatinine, Ser: 0.93 mg/dL (ref 0.40–1.20)
GFR: 60.3 mL/min (ref >60.00)
Glucose, Bld: 81 mg/dL (ref 70–99)
Potassium: 4.7 mEq/L (ref 3.5–5.1)
Sodium: 139 mEq/L (ref 135–145)
Total Bilirubin: 0.2 mg/dL (ref 0.2–1.2)
Total Protein: 6.3 g/dL (ref 6.0–8.3)

## 2016-08-21 LAB — TSH: TSH: 0.98 u[IU]/mL (ref 0.35–4.50)

## 2016-08-21 MED ORDER — PROMETHAZINE HCL 12.5 MG PO TABS: 12.5000 mg | ORAL_TABLET | Freq: Three times a day (TID) | ORAL | 0 refills | Status: DC | PRN

## 2016-08-21 NOTE — Progress Notes (Signed)
Subjective:    Patient ID: [name] [name], female    DOB: 06-24-1925, 83 y.o.   MRN: [id number]  HPI   [name] [name] is an 83 yr old female who presents today for follow up.  1) HTN- She is not currently on an antihypertensive. BP Readings from Last 3 Encounters:  08/21/16 (!) 125/54  07/04/16 (!) 142/70  06/04/16 112/68   2) COPD- uses oxygen at night.  Continues to see pulmonary. Reports respiratory status has been stable.   3) HA- reports that she continues to have Daily headaches. She reports taking Goody powders every day.  Her daughter is concerned about her memory. She reports that she often asks the same questions twice and often repeats herself.  Daughter is also concerned about patient having progressive unintentional weight loss. She reports that the patient does eat regular meals however she eats small amounts. Review of Systems    see HPI  Past Medical History:  Diagnosis Date  . Abdominal pain    Resolved  . Cardiac arrhythmia   . CHF (congestive heart failure) ([place])    Diastolic  on ECHO 7672  . Choledocholithiasis   . Chronic anemia   . Chronic back pain   . COPD (chronic obstructive pulmonary disease) (HCC)    oxygen dependent at  night 2LPM  . Daily headache   . GERD (gastroesophageal reflux disease)   . HCAP (healthcare-associated pneumonia) 03/28/2014  . Hiatal hernia   . Hyperlipidemia   . Hypertension   . On home oxygen therapy    "1L during the day; 2L q hs" (03/28/2014)  . Osteoarthritis    "pretty much q where"   . Osteoporosis   . Pancolitis (HCC)    Infectious vs. inflammatory  . Pancreatitis    History of  . Pneumonia 1930's; 2011 X 2; 02/2014  . PONV (postoperative nausea and vomiting)   . PUD (peptic ulcer disease)   . Reactive airway disease that is not asthma   . Recurrent UTI (urinary tract infection)    "here lately" (03/28/2014)  . Scoliosis deformity of spine    history of intractable back pain     Social History   Social  History  . Marital status: Widowed    Spouse name: N/A  . Number of children: 3  . Years of education: N/A   Occupational History  .  Retired   Social History Main Topics  . Smoking status: Former Smoker    Packs/day: 1.00    Years: 12.00    Types: Cigarettes    Quit date: 01/28/1988  . Smokeless tobacco: Never Used  . Alcohol use No  . Drug use: No  . Sexual activity: No   Other Topics Concern  . Not on file   Social History Narrative   1 grandchild at care link--Angelia   Lives with great-granddaughter    Past Surgical History:  Procedure Laterality Date  . ABDOMINAL HYSTERECTOMY  1970s  . BACK SURGERY     x 5  . BREAST CYST EXCISION Right 1960's  . CERVICAL LAMINECTOMY  1970s  . CHOLECYSTECTOMY  2008  . ERCP W/ SPHICTEROTOMY  02/2010  . ESOPHAGOGASTRODUODENOSCOPY N/A 11/14/2013   Procedure: ESOPHAGOGASTRODUODENOSCOPY (EGD);  Surgeon: Missy Sabins, MD;  [facility] ENDOSCOPY;  Service: Endoscopy;  Laterality: N/A;  . FLEXIBLE SIGMOIDOSCOPY N/A 01/24/2013   Procedure: FLEXIBLE SIGMOIDOSCOPY;  Surgeon: Missy Sabins, MD;  Location: [place];  Service: Endoscopy;  Laterality: N/A;  . PAIN PUMP IMPLANTATION  ~  2010   back, dilaudid and bupravacaine  . SPINAL CORD STIMULATOR IMPLANT  ~ 2009  . SPINE SURGERY  1970's,1995,2007    Family History  Problem Relation Age of Onset  . Cancer Mother        uterine  . Heart disease Father   . Hypertension Other   . Osteoporosis Neg Hx     Allergies  Allergen Reactions  . Codeine Diarrhea and Nausea And Vomiting    REACTION: n/v/d, HA  . Morphine Diarrhea and Nausea And Vomiting    REACTION: n/v/d, HA  . Sulfa Antibiotics Nausea Only    Sick    . Zoledronic Acid Swelling    REACTION: Severe edema    Current Outpatient Prescriptions on File Prior to Visit  Medication Sig Dispense Refill  . acetaminophen (TYLENOL) 500 MG tablet Take 500 mg by mouth 2 (two) times daily as needed for headache.    . albuterol  (PROVENTIL HFA;VENTOLIN HFA) 108 (90 Base) MCG/ACT inhaler Inhale 1-2 puffs into the lungs every 6 (six) hours as needed (wheezing & shortness of breath). Reported on 02/28/2015 1 Inhaler 5  . Ascorbic Acid (VITAMIN C) 500 MG tablet Take 500 mg by mouth daily.      . Aspirin-Acetaminophen-Caffeine (GOODY HEADACHE PO) Take by mouth.    . budesonide-formoterol (SYMBICORT) 160-4.5 MCG/ACT inhaler Inhale 2 puffs into the lungs 2 (two) times daily. 1 Inhaler 0  . butalbital-acetaminophen-caffeine (FIORICET, ESGIC) 50-325-40 MG tablet Take 1 tablet by mouth every 4 (four) hours as needed for headache.     . Calcium Carbonate-Vitamin D (CALCIUM 600-D) 600-400 MG-UNIT per tablet Take 1 tablet by mouth 2 (two) times daily with a meal.      . cholecalciferol (VITAMIN D) 1000 units tablet Take 1,000 Units by mouth daily.    . diclofenac sodium (VOLTAREN) 1 % GEL Place 1 application onto the skin 2 (two) times daily as needed.    . gabapentin (NEURONTIN) 100 MG capsule Take 1 capsule (100 mg total) by mouth 2 (two) times daily. 60 capsule 3  . hyoscyamine (LEVSIN SL) 0.125 MG SL tablet Take 0.125 mg by mouth as needed. Abdominal pain    . lactulose (CHRONULAC) 10 GM/15ML solution 2 teaspoonful at bedtime as needed for constipation 500 mL 0  . Multiple Vitamins-Minerals (MULTIVITAMIN WITH MINERALS) tablet Take 1 tablet by mouth daily. 30 tablet   . nitroGLYCERIN (NITROSTAT) 0.4 MG SL tablet Place 1 tablet (0.4 mg total) under the tongue every 5 (five) minutes as needed for chest pain. 100 tablet 3  . omeprazole (PRILOSEC) 40 MG capsule Take 1 capsule (40 mg total) by mouth 2 (two) times daily. 60 capsule 3  . oxyCODONE-acetaminophen (PERCOCET) 7.5-325 MG tablet Take 1 tablet by mouth 2 (two) times daily as needed for severe pain.    . Probiotic Product (PROBIOTIC DAILY PO) Take 1 tablet by mouth daily at 12 noon.    [name] VITAMIN E PO Take 1 tablet by mouth daily.     No current facility-administered medications on  file prior to visit.     BP (!) 125/54   Pulse 70   Temp 98.2 F (36.8 C) (Oral)   Ht 5' (1.524 m)   Wt 104 lb 6.4 oz (47.4 kg)   SpO2 94%   BMI 20.39 kg/m    Objective:   Physical Exam  Constitutional: She appears well-developed and well-nourished.  Cardiovascular: Normal rate, regular rhythm and normal heart sounds.   No murmur heard. Pulmonary/Chest:  Effort normal and breath sounds normal. No respiratory distress. She has no wheezes.  Abdominal: Soft. She exhibits no distension. There is no tenderness. There is no rebound and no guarding.  Musculoskeletal: She exhibits no edema.  Neurological: She is alert.  She was oriented to place and person. She was unable to name the year properly  Psychiatric: She has a normal mood and affect. Her behavior is normal. Judgment and thought content normal.          Assessment & Plan:  Weight loss-  114 back in December. Will obtain TSH. Will also refer for CT of the chest abdomen and pelvis for further evaluation. She is a former smoker. We discussed adding an end sure every day. Also discussed ensuring 3 well-balanced meals. Wt Readings from Last 3 Encounters:  08/21/16 104 lb 6.4 oz (47.4 kg)  07/04/16 107 lb 9.6 oz (48.8 kg)  06/04/16 105 lb (47.6 kg)   Memory loss- will refer to neurology for further evaluation.  Headaches-will refer to neurology for further evaluation. I've also advised the patient to discontinue use of Goody powders. Instead advised use of Tylenol on an as-needed basis

## 2016-08-21 NOTE — Patient Instructions (Addendum)
Please complete lab work prior to leaving. You will be contacted about your referral to Neurology to further evaluate your headaches and your memory loss. You will be contacted about scheduling your CT scan. Stop Goody Powders. Instead you may use tylenol as needed.

## 2016-08-31 ENCOUNTER — Telehealth: Payer: Self-pay | Admitting: Family

## 2016-08-31 NOTE — Telephone Encounter (Signed)
-----   Message from Debbrah Alar, NP sent at 08/31/2016  9:10 PM EDT ----- Regarding: RE: CT  abd/pelvis & chest Noted.  I discussed with pt and daughter that possible cause for weight loss in a patient her age is underlying malignancy and the only way to know for sure would be to complete CT. They both verbalized understanding.  ----- Message ----- From: Max Sane A Sent: 08/29/2016  11:42 AM To: Debbrah Alar, NP, # Subject: CT  abd/pelvis & chest                         Hello Dr. Inda Castle,  The patient above, [name] [name], has advised that she would not like to get neither of the CT's done.   Thank you!  Dest[facility] - [facility]

## 2016-09-01 ENCOUNTER — Telehealth: Payer: Self-pay | Admitting: *Deleted

## 2016-09-01 DIAGNOSIS — R109 Unspecified abdominal pain: Secondary | ICD-10-CM | POA: Diagnosis not present

## 2016-09-01 DIAGNOSIS — G8929 Other chronic pain: Secondary | ICD-10-CM | POA: Diagnosis not present

## 2016-09-01 DIAGNOSIS — K8689 Other specified diseases of pancreas: Secondary | ICD-10-CM | POA: Diagnosis not present

## 2016-09-01 DIAGNOSIS — J9811 Atelectasis: Secondary | ICD-10-CM | POA: Diagnosis not present

## 2016-09-01 DIAGNOSIS — R1084 Generalized abdominal pain: Secondary | ICD-10-CM | POA: Diagnosis not present

## 2016-09-01 DIAGNOSIS — R4182 Altered mental status, unspecified: Secondary | ICD-10-CM | POA: Diagnosis not present

## 2016-09-01 DIAGNOSIS — Z9689 Presence of other specified functional implants: Secondary | ICD-10-CM | POA: Diagnosis not present

## 2016-09-01 DIAGNOSIS — G9340 Encephalopathy, unspecified: Secondary | ICD-10-CM | POA: Diagnosis not present

## 2016-09-01 DIAGNOSIS — Z9049 Acquired absence of other specified parts of digestive tract: Secondary | ICD-10-CM | POA: Diagnosis not present

## 2016-09-01 DIAGNOSIS — J438 Other emphysema: Secondary | ICD-10-CM | POA: Diagnosis not present

## 2016-09-01 DIAGNOSIS — D61818 Other pancytopenia: Secondary | ICD-10-CM | POA: Diagnosis not present

## 2016-09-01 DIAGNOSIS — M545 Low back pain: Secondary | ICD-10-CM | POA: Diagnosis not present

## 2016-09-01 DIAGNOSIS — R188 Other ascites: Secondary | ICD-10-CM | POA: Diagnosis not present

## 2016-09-01 DIAGNOSIS — R51 Headache: Secondary | ICD-10-CM | POA: Diagnosis not present

## 2016-09-01 DIAGNOSIS — Z79899 Other long term (current) drug therapy: Secondary | ICD-10-CM | POA: Diagnosis not present

## 2016-09-01 DIAGNOSIS — R29818 Other symptoms and signs involving the nervous system: Secondary | ICD-10-CM | POA: Diagnosis not present

## 2016-09-01 DIAGNOSIS — G894 Chronic pain syndrome: Secondary | ICD-10-CM | POA: Diagnosis not present

## 2016-09-01 DIAGNOSIS — R9082 White matter disease, unspecified: Secondary | ICD-10-CM | POA: Diagnosis not present

## 2016-09-01 DIAGNOSIS — K5730 Diverticulosis of large intestine without perforation or abscess without bleeding: Secondary | ICD-10-CM | POA: Diagnosis not present

## 2016-09-01 DIAGNOSIS — Z5181 Encounter for therapeutic drug level monitoring: Secondary | ICD-10-CM | POA: Diagnosis not present

## 2016-09-01 DIAGNOSIS — K529 Noninfective gastroenteritis and colitis, unspecified: Secondary | ICD-10-CM | POA: Diagnosis not present

## 2016-09-01 DIAGNOSIS — A419 Sepsis, unspecified organism: Secondary | ICD-10-CM | POA: Diagnosis not present

## 2016-09-01 NOTE — Telephone Encounter (Signed)
-----   Message from [place] sent at 08/29/2016 11:42 AM EDT ----- Regarding: CT  abd/pelvis & chest Hello Dr. Inda Castle,  The patient above, [name] [name], has advised that she would not like to get neither of the CT's done.   Thank you!  Destiny - [facility]

## 2016-09-02 DIAGNOSIS — K529 Noninfective gastroenteritis and colitis, unspecified: Secondary | ICD-10-CM | POA: Diagnosis not present

## 2016-09-02 DIAGNOSIS — R1084 Generalized abdominal pain: Secondary | ICD-10-CM | POA: Diagnosis not present

## 2016-09-02 DIAGNOSIS — A419 Sepsis, unspecified organism: Secondary | ICD-10-CM | POA: Diagnosis not present

## 2016-09-02 DIAGNOSIS — G9340 Encephalopathy, unspecified: Secondary | ICD-10-CM | POA: Diagnosis not present

## 2016-09-02 DIAGNOSIS — G894 Chronic pain syndrome: Secondary | ICD-10-CM | POA: Diagnosis not present

## 2016-09-02 DIAGNOSIS — K8689 Other specified diseases of pancreas: Secondary | ICD-10-CM | POA: Diagnosis not present

## 2016-09-02 DIAGNOSIS — K5730 Diverticulosis of large intestine without perforation or abscess without bleeding: Secondary | ICD-10-CM | POA: Diagnosis not present

## 2016-09-02 DIAGNOSIS — R188 Other ascites: Secondary | ICD-10-CM | POA: Diagnosis not present

## 2016-09-02 DIAGNOSIS — R000 Tachycardia, unspecified: Secondary | ICD-10-CM | POA: Diagnosis not present

## 2016-09-02 DIAGNOSIS — R4182 Altered mental status, unspecified: Secondary | ICD-10-CM | POA: Diagnosis not present

## 2016-09-02 DIAGNOSIS — Z9049 Acquired absence of other specified parts of digestive tract: Secondary | ICD-10-CM | POA: Diagnosis not present

## 2016-09-02 DIAGNOSIS — R109 Unspecified abdominal pain: Secondary | ICD-10-CM | POA: Diagnosis not present

## 2016-09-03 DIAGNOSIS — M549 Dorsalgia, unspecified: Secondary | ICD-10-CM | POA: Diagnosis present

## 2016-09-03 DIAGNOSIS — M5136 Other intervertebral disc degeneration, lumbar region: Secondary | ICD-10-CM | POA: Diagnosis present

## 2016-09-03 DIAGNOSIS — Z9981 Dependence on supplemental oxygen: Secondary | ICD-10-CM | POA: Diagnosis not present

## 2016-09-03 DIAGNOSIS — J438 Other emphysema: Secondary | ICD-10-CM | POA: Diagnosis present

## 2016-09-03 DIAGNOSIS — G894 Chronic pain syndrome: Secondary | ICD-10-CM | POA: Diagnosis present

## 2016-09-03 DIAGNOSIS — G9340 Encephalopathy, unspecified: Secondary | ICD-10-CM | POA: Diagnosis present

## 2016-09-03 DIAGNOSIS — M797 Fibromyalgia: Secondary | ICD-10-CM | POA: Diagnosis present

## 2016-09-03 DIAGNOSIS — K529 Noninfective gastroenteritis and colitis, unspecified: Secondary | ICD-10-CM | POA: Diagnosis present

## 2016-09-03 DIAGNOSIS — Z79899 Other long term (current) drug therapy: Secondary | ICD-10-CM | POA: Diagnosis not present

## 2016-09-03 DIAGNOSIS — Z7951 Long term (current) use of inhaled steroids: Secondary | ICD-10-CM | POA: Diagnosis not present

## 2016-09-03 DIAGNOSIS — Z87891 Personal history of nicotine dependence: Secondary | ICD-10-CM | POA: Diagnosis not present

## 2016-09-03 DIAGNOSIS — I10 Essential (primary) hypertension: Secondary | ICD-10-CM | POA: Diagnosis present

## 2016-09-03 DIAGNOSIS — R000 Tachycardia, unspecified: Secondary | ICD-10-CM | POA: Diagnosis not present

## 2016-09-03 DIAGNOSIS — R4182 Altered mental status, unspecified: Secondary | ICD-10-CM | POA: Diagnosis not present

## 2016-09-03 DIAGNOSIS — J9811 Atelectasis: Secondary | ICD-10-CM | POA: Diagnosis not present

## 2016-09-03 DIAGNOSIS — A419 Sepsis, unspecified organism: Secondary | ICD-10-CM | POA: Diagnosis present

## 2016-09-03 DIAGNOSIS — Z885 Allergy status to narcotic agent status: Secondary | ICD-10-CM | POA: Diagnosis not present

## 2016-09-03 DIAGNOSIS — R109 Unspecified abdominal pain: Secondary | ICD-10-CM | POA: Diagnosis not present

## 2016-09-03 DIAGNOSIS — D61818 Other pancytopenia: Secondary | ICD-10-CM | POA: Diagnosis not present

## 2016-09-03 DIAGNOSIS — Z9689 Presence of other specified functional implants: Secondary | ICD-10-CM | POA: Diagnosis present

## 2016-09-03 DIAGNOSIS — R51 Headache: Secondary | ICD-10-CM | POA: Diagnosis present

## 2016-09-03 DIAGNOSIS — M069 Rheumatoid arthritis, unspecified: Secondary | ICD-10-CM | POA: Diagnosis present

## 2016-09-05 ENCOUNTER — Ambulatory Visit: Payer: Medicare Other | Admitting: Pulmonary Disease

## 2016-09-05 ENCOUNTER — Telehealth: Payer: Self-pay | Admitting: Family

## 2016-09-05 NOTE — Telephone Encounter (Signed)
Pt's daughter called in to request a hospital fu apt. She said that they need an afternoon apt. Pt was discharged today. Not showing an apt available.   Please advise for an apt.

## 2016-09-05 NOTE — Telephone Encounter (Signed)
I will call pt's daughter back to schedule.   [name] [phone]

## 2016-09-06 NOTE — Telephone Encounter (Signed)
[name] [name], could you advise for apt date/ time? Please.

## 2016-09-07 NOTE — Telephone Encounter (Signed)
I see a 2:15 on Friday (30 min slot).  That looks like all I have this week.

## 2016-09-08 ENCOUNTER — Telehealth: Payer: Self-pay | Admitting: *Deleted

## 2016-09-08 ENCOUNTER — Telehealth: Payer: Self-pay | Admitting: Family

## 2016-09-08 NOTE — Telephone Encounter (Signed)
Received Physician Orders Certificate of Medical Necessity - Oxygen from [facility], forwarded to provider with last OV note & last Sleep Study/SLS 08/13

## 2016-09-08 NOTE — Telephone Encounter (Signed)
Thank you!   I wasn't showing that available time before. My apologies.    Pt has been scheduled for Friday 09/12/16 at 2:15p

## 2016-09-08 NOTE — Telephone Encounter (Signed)
Prolia benefits verified  No PA required Prolia should be covered 100%  Patient may owe approximately $0 OOP  Due after 10/20/16

## 2016-09-09 NOTE — Telephone Encounter (Signed)
Pt's grand daughter called in because she said that pt was in the hospital at [facility]. She said that they would like to have PCP request records for visit DOS 09/01/16-09/05/16.      Fax: [phone] Phone : [phone] main #    [name] ( grand daughter ) -[phone]  Granddaughter would like to have a call once records are received to confirm. She said that she will schedule a fu visit with pcp once records are received.

## 2016-09-09 NOTE — Telephone Encounter (Signed)
Will discuss Prolia at appt on Friday.  Reviewed Care Everywhere and records are available for course of recent hospitalization. Left detailed message on [name]'s voicemail that we will plan to see pt on 09/12/16 as currently scheduled unless they call back to change appointment.

## 2016-09-12 ENCOUNTER — Ambulatory Visit (INDEPENDENT_AMBULATORY_CARE_PROVIDER_SITE_OTHER): Payer: Medicare Other | Admitting: Family

## 2016-09-12 ENCOUNTER — Encounter: Payer: Self-pay | Admitting: Family

## 2016-09-12 VITALS — BP 136/54 | HR 85 | Temp 98.3°F | Resp 18 | Ht 60.0 in | Wt 107.0 lb

## 2016-09-12 DIAGNOSIS — K529 Noninfective gastroenteritis and colitis, unspecified: Secondary | ICD-10-CM

## 2016-09-12 DIAGNOSIS — D61818 Other pancytopenia: Secondary | ICD-10-CM | POA: Diagnosis not present

## 2016-09-12 LAB — CBC WITH DIFFERENTIAL/PLATELET
Basophils Absolute: 0 cells/uL (ref 0–200)
Basophils Relative: 0 %
Eosinophils Absolute: 108 cells/uL (ref 15–500)
Eosinophils Relative: 3 %
HCT: 34.1 % — ABNORMAL LOW (ref 35.0–45.0)
Hemoglobin: 11.2 g/dL — ABNORMAL LOW (ref 11.7–15.5)
Lymphocytes Relative: 23 %
Lymphs Abs: 828 cells/uL — ABNORMAL LOW (ref 850–3900)
MCH: 31.1 pg (ref 27.0–33.0)
MCHC: 32.8 g/dL (ref 32.0–36.0)
MCV: 94.7 fL (ref 80.0–100.0)
MPV: 10.4 fL (ref 7.5–12.5)
Monocytes Absolute: 360 cells/uL (ref 200–950)
Monocytes Relative: 10 %
Neutro Abs: 2304 cells/uL (ref 1500–7800)
Neutrophils Relative %: 64 %
Platelets: 192 10*3/uL (ref 140–400)
RBC: 3.6 MIL/uL — ABNORMAL LOW (ref 3.80–5.10)
RDW: 14.6 % (ref 11.0–15.0)
WBC: 3.6 10*3/uL — ABNORMAL LOW (ref 3.8–10.8)

## 2016-09-12 NOTE — Patient Instructions (Signed)
Please complete lab work prior to leaving. Keep your upcoming appointment with Dr. Amada Kingfisher.

## 2016-09-12 NOTE — Progress Notes (Signed)
Subjective:    Patient ID: [name] [name], female    DOB: 1925/03/16, 83 y.o.   MRN: [id number]  HPI  [name] [name] is n 83 yr old female who presents today for hospital follow up.   Discharge summary is reviewed.  Pt was admitted 8/6-8/10/18. She presented with worsening headaches, had pain pump refilled that day at the pain clinic.  She then had an episode of confusion. CT head was negative for stroke. Ct abd obtained due to complaints of abd pain and n/v. Found to have multifocal wall thickening suggestive of gastroenteritis and distal colitis. Treated with cipro/flagyl which was completed yesterday. Denies current abdominal pain. Reports tolerating PO's but doesn't have full appetite back yet.  Was noted to have some mild pancytopenia during her hospitalization.      Review of Systems See HPI  Past Medical History:  Diagnosis Date  . Abdominal pain    Resolved  . Cardiac arrhythmia   . CHF (congestive heart failure) ([place])    Diastolic  on ECHO 8299  . Choledocholithiasis   . Chronic anemia   . Chronic back pain   . COPD (chronic obstructive pulmonary disease) (HCC)    oxygen dependent at  night 2LPM  . Daily headache   . GERD (gastroesophageal reflux disease)   . HCAP (healthcare-associated pneumonia) 03/28/2014  . Hiatal hernia   . Hyperlipidemia   . Hypertension   . On home oxygen therapy    "1L during the day; 2L q hs" (03/28/2014)  . Osteoarthritis    "pretty much q where"   . Osteoporosis   . Pancolitis (HCC)    Infectious vs. inflammatory  . Pancreatitis    History of  . Pneumonia 1930's; 2011 X 2; 02/2014  . PONV (postoperative nausea and vomiting)   . PUD (peptic ulcer disease)   . Reactive airway disease that is not asthma   . Recurrent UTI (urinary tract infection)    "here lately" (03/28/2014)  . Scoliosis deformity of spine    history of intractable back pain     Social History   Social History  . Marital status: Widowed    Spouse name: N/A  . Number of  children: 3  . Years of education: N/A   Occupational History  .  Retired   Social History Main Topics  . Smoking status: Former Smoker    Packs/day: 1.00    Years: 12.00    Types: Cigarettes    Quit date: 01/28/1988  . Smokeless tobacco: Never Used  . Alcohol use No  . Drug use: No  . Sexual activity: No   Other Topics Concern  . Not on file   Social History Narrative   1 grandchild at care link--Angelia   Lives with great-granddaughter    Past Surgical History:  Procedure Laterality Date  . ABDOMINAL HYSTERECTOMY  1970s  . BACK SURGERY     x 5  . BREAST CYST EXCISION Right 1960's  . CERVICAL LAMINECTOMY  1970s  . CHOLECYSTECTOMY  2008  . ERCP W/ SPHICTEROTOMY  02/2010  . ESOPHAGOGASTRODUODENOSCOPY N/A 11/14/2013   Procedure: ESOPHAGOGASTRODUODENOSCOPY (EGD);  Surgeon: Missy Sabins, MD;  Location: [facility] ENDOSCOPY;  Service: Endoscopy;  Laterality: N/A;  . FLEXIBLE SIGMOIDOSCOPY N/A 01/24/2013   Procedure: FLEXIBLE SIGMOIDOSCOPY;  Surgeon: Missy Sabins, MD;  Location: [place];  Service: Endoscopy;  Laterality: N/A;  . PAIN PUMP IMPLANTATION  ~ 2010   back, dilaudid and bupravacaine  . SPINAL CORD STIMULATOR IMPLANT  ~  2009  . SPINE SURGERY  1970's,1995,2007    Family History  Problem Relation Age of Onset  . Cancer Mother        uterine  . Heart disease Father   . Hypertension Other   . Osteoporosis Neg Hx     Allergies  Allergen Reactions  . Codeine Diarrhea and Nausea And Vomiting    REACTION: n/v/d, HA  . Morphine Diarrhea and Nausea And Vomiting    REACTION: n/v/d, HA  . Sulfa Antibiotics Nausea Only    Sick    . Zoledronic Acid Swelling    REACTION: Severe edema    Current Outpatient Prescriptions on File Prior to Visit  Medication Sig Dispense Refill  . acetaminophen (TYLENOL) 500 MG tablet Take 500 mg by mouth 2 (two) times daily as needed for headache.    . albuterol (PROVENTIL HFA;VENTOLIN HFA) 108 (90 Base) MCG/ACT inhaler Inhale 1-2  puffs into the lungs every 6 (six) hours as needed (wheezing & shortness of breath). Reported on 02/28/2015 1 Inhaler 5  . Ascorbic Acid (VITAMIN C) 500 MG tablet Take 500 mg by mouth daily.      . Aspirin-Acetaminophen-Caffeine (GOODY HEADACHE PO) Take by mouth.    . budesonide-formoterol (SYMBICORT) 160-4.5 MCG/ACT inhaler Inhale 2 puffs into the lungs 2 (two) times daily. 1 Inhaler 0  . butalbital-acetaminophen-caffeine (FIORICET, ESGIC) 50-325-40 MG tablet Take 1 tablet by mouth every 4 (four) hours as needed for headache.     . Calcium Carbonate-Vitamin D (CALCIUM 600-D) 600-400 MG-UNIT per tablet Take 1 tablet by mouth 2 (two) times daily with a meal.      . cholecalciferol (VITAMIN D) 1000 units tablet Take 1,000 Units by mouth daily.    . diclofenac sodium (VOLTAREN) 1 % GEL Place 1 application onto the skin 2 (two) times daily as needed.    . gabapentin (NEURONTIN) 100 MG capsule Take 1 capsule (100 mg total) by mouth 2 (two) times daily. 60 capsule 3  . hyoscyamine (LEVSIN SL) 0.125 MG SL tablet Take 0.125 mg by mouth as needed. Abdominal pain    . lactulose (CHRONULAC) 10 GM/15ML solution 2 teaspoonful at bedtime as needed for constipation 500 mL 0  . Multiple Vitamins-Minerals (MULTIVITAMIN WITH MINERALS) tablet Take 1 tablet by mouth daily. 30 tablet   . nitroGLYCERIN (NITROSTAT) 0.4 MG SL tablet Place 1 tablet (0.4 mg total) under the tongue every 5 (five) minutes as needed for chest pain. 100 tablet 3  . omeprazole (PRILOSEC) 40 MG capsule Take 1 capsule (40 mg total) by mouth 2 (two) times daily. 60 capsule 3  . oxyCODONE-acetaminophen (PERCOCET) 7.5-325 MG tablet Take 1 tablet by mouth 2 (two) times daily as needed for severe pain.    . Probiotic Product (PROBIOTIC DAILY PO) Take 1 tablet by mouth daily at 12 noon.    . promethazine (PHENERGAN) 12.5 MG tablet Take 1 tablet (12.5 mg total) by mouth every 8 (eight) hours as needed for nausea or vomiting. 20 tablet 0  . VITAMIN E PO Take  1 tablet by mouth daily.     No current facility-administered medications on file prior to visit.     BP (!) 136/54 (BP Location: Right Arm, Cuff Size: Normal)   Pulse 85   Temp 98.3 F (36.8 C) (Oral)   Resp 18   Ht 5' (1.524 m)   Wt 107 lb (48.5 kg)   SpO2 96%   BMI 20.90 kg/m       Objective:  Physical Exam  Constitutional: She is oriented to person, place, and time. She appears well-developed and well-nourished.  HENT:  Head: Normocephalic and atraumatic.  Cardiovascular: Normal rate, regular rhythm and normal heart sounds.   No murmur heard. Pulmonary/Chest: Effort normal and breath sounds normal. No respiratory distress. She has no wheezes.  Neurological: She is alert and oriented to person, place, and time.  Psychiatric: She has a normal mood and affect. Her behavior is normal. Judgment and thought content normal.          Assessment & Plan:  Pancytopenia- noted on hospital labs- repeat cbc with diff.  Colitis- clinically resolved. Advised pt to keep upcoming appointment with GI (Dr. Amada Kingfisher).  Hypocalcemia- noted during hospitalization, repeat serum calcium level.   Advised pt to keep upcoming appointment with neurology as well due to her recent episode of confusion. She is advised to d/d goody powders.

## 2016-09-13 LAB — COMPREHENSIVE METABOLIC PANEL
ALT: 12 U/L (ref 6–29)
AST: 16 U/L (ref 10–35)
Albumin: 3.5 g/dL — ABNORMAL LOW (ref 3.6–5.1)
Alkaline Phosphatase: 55 U/L (ref 33–130)
BUN: 13 mg/dL (ref 7–25)
CO2: 31 mmol/L (ref 20–32)
Calcium: 8.6 mg/dL (ref 8.6–10.4)
Chloride: 103 mmol/L (ref 98–110)
Creat: 0.71 mg/dL (ref 0.60–0.88)
Glucose, Bld: 104 mg/dL — ABNORMAL HIGH (ref 65–99)
Potassium: 3.7 mmol/L (ref 3.5–5.3)
Sodium: 142 mmol/L (ref 135–146)
Total Bilirubin: 0.2 mg/dL (ref 0.2–1.2)
Total Protein: 5.5 g/dL — ABNORMAL LOW (ref 6.1–8.1)

## 2016-09-18 NOTE — Telephone Encounter (Signed)
Notified pt and daughter and scheduled Prolia injection for 10/22/16 at 2pm. Medication is in [place].

## 2016-09-22 ENCOUNTER — Other Ambulatory Visit: Payer: Self-pay | Admitting: Family

## 2016-09-22 DIAGNOSIS — R932 Abnormal findings on diagnostic imaging of liver and biliary tract: Secondary | ICD-10-CM | POA: Diagnosis not present

## 2016-09-22 DIAGNOSIS — D500 Iron deficiency anemia secondary to blood loss (chronic): Secondary | ICD-10-CM | POA: Diagnosis not present

## 2016-09-22 DIAGNOSIS — E8809 Other disorders of plasma-protein metabolism, not elsewhere classified: Secondary | ICD-10-CM

## 2016-09-22 DIAGNOSIS — D649 Anemia, unspecified: Secondary | ICD-10-CM

## 2016-10-02 ENCOUNTER — Telehealth: Payer: Self-pay | Admitting: *Deleted

## 2016-10-02 NOTE — Telephone Encounter (Signed)
Received Physician Orders with request for missing answers and correction to some information, completed; forwarded to provider to Initial and Date by corrections/SLS 09/06

## 2016-10-08 DIAGNOSIS — H353131 Nonexudative age-related macular degeneration, bilateral, early dry stage: Secondary | ICD-10-CM | POA: Diagnosis not present

## 2016-10-08 DIAGNOSIS — H524 Presbyopia: Secondary | ICD-10-CM | POA: Diagnosis not present

## 2016-10-08 DIAGNOSIS — H2513 Age-related nuclear cataract, bilateral: Secondary | ICD-10-CM | POA: Diagnosis not present

## 2016-10-08 DIAGNOSIS — H2511 Age-related nuclear cataract, right eye: Secondary | ICD-10-CM | POA: Diagnosis not present

## 2016-10-08 DIAGNOSIS — H25011 Cortical age-related cataract, right eye: Secondary | ICD-10-CM | POA: Diagnosis not present

## 2016-10-08 DIAGNOSIS — H25013 Cortical age-related cataract, bilateral: Secondary | ICD-10-CM | POA: Diagnosis not present

## 2016-10-08 DIAGNOSIS — H5203 Hypermetropia, bilateral: Secondary | ICD-10-CM | POA: Diagnosis not present

## 2016-10-08 DIAGNOSIS — H52203 Unspecified astigmatism, bilateral: Secondary | ICD-10-CM | POA: Diagnosis not present

## 2016-10-15 DIAGNOSIS — R51 Headache: Secondary | ICD-10-CM | POA: Diagnosis not present

## 2016-10-15 DIAGNOSIS — Z9689 Presence of other specified functional implants: Secondary | ICD-10-CM | POA: Diagnosis not present

## 2016-10-15 DIAGNOSIS — G8929 Other chronic pain: Secondary | ICD-10-CM | POA: Diagnosis not present

## 2016-10-15 DIAGNOSIS — G894 Chronic pain syndrome: Secondary | ICD-10-CM | POA: Diagnosis not present

## 2016-10-15 DIAGNOSIS — M545 Low back pain: Secondary | ICD-10-CM | POA: Diagnosis not present

## 2016-10-21 ENCOUNTER — Ambulatory Visit: Payer: Medicare Other | Admitting: Neurology

## 2016-10-22 ENCOUNTER — Ambulatory Visit: Payer: Medicare Other

## 2016-10-22 ENCOUNTER — Other Ambulatory Visit: Payer: Medicare Other

## 2016-10-28 ENCOUNTER — Telehealth: Payer: Self-pay

## 2016-10-28 NOTE — Telephone Encounter (Signed)
Spoke with the patient and she is aware that she needs to schedule her Prolia injection- she has no out-of-pocket expense, does not need a PA, and needs to be scheduled asap to keep her current- patient stated she would have her daughter call later today to schedule

## 2016-11-04 DIAGNOSIS — H52201 Unspecified astigmatism, right eye: Secondary | ICD-10-CM | POA: Diagnosis not present

## 2016-11-04 DIAGNOSIS — H25813 Combined forms of age-related cataract, bilateral: Secondary | ICD-10-CM | POA: Diagnosis not present

## 2016-11-04 DIAGNOSIS — H2511 Age-related nuclear cataract, right eye: Secondary | ICD-10-CM | POA: Diagnosis not present

## 2016-11-04 DIAGNOSIS — H25011 Cortical age-related cataract, right eye: Secondary | ICD-10-CM | POA: Diagnosis not present

## 2016-11-05 ENCOUNTER — Ambulatory Visit: Payer: Medicare Other

## 2016-11-12 ENCOUNTER — Telehealth: Payer: Self-pay

## 2016-11-12 NOTE — Telephone Encounter (Signed)
Spoke with patient and she was unclear when she could come in for Prolia but wrote the office number down and stated she would have her daughter call me back as soon as she could

## 2016-11-14 DIAGNOSIS — Z23 Encounter for immunization: Secondary | ICD-10-CM | POA: Diagnosis not present

## 2016-11-19 ENCOUNTER — Ambulatory Visit (INDEPENDENT_AMBULATORY_CARE_PROVIDER_SITE_OTHER): Payer: Medicare Other

## 2016-11-19 DIAGNOSIS — M1990 Unspecified osteoarthritis, unspecified site: Secondary | ICD-10-CM | POA: Diagnosis not present

## 2016-11-19 MED ORDER — DENOSUMAB 60 MG/ML ~~LOC~~ SOLN
60.0000 mg | Freq: Once | SUBCUTANEOUS | Status: AC
  Administered 2016-11-19: 60 mg via SUBCUTANEOUS

## 2016-12-01 DIAGNOSIS — R51 Headache: Secondary | ICD-10-CM | POA: Diagnosis not present

## 2016-12-01 DIAGNOSIS — Z9689 Presence of other specified functional implants: Secondary | ICD-10-CM | POA: Diagnosis not present

## 2016-12-01 DIAGNOSIS — G894 Chronic pain syndrome: Secondary | ICD-10-CM | POA: Diagnosis not present

## 2016-12-01 DIAGNOSIS — M545 Low back pain: Secondary | ICD-10-CM | POA: Diagnosis not present

## 2016-12-05 ENCOUNTER — Ambulatory Visit (INDEPENDENT_AMBULATORY_CARE_PROVIDER_SITE_OTHER): Payer: Medicare Other | Admitting: Adult Health

## 2016-12-05 ENCOUNTER — Encounter: Payer: Self-pay | Admitting: Adult Health

## 2016-12-05 VITALS — BP 128/64 | HR 85 | Ht 60.0 in | Wt 103.6 lb

## 2016-12-05 DIAGNOSIS — J439 Emphysema, unspecified: Secondary | ICD-10-CM

## 2016-12-05 DIAGNOSIS — J9611 Chronic respiratory failure with hypoxia: Secondary | ICD-10-CM | POA: Diagnosis not present

## 2016-12-05 DIAGNOSIS — Z23 Encounter for immunization: Secondary | ICD-10-CM | POA: Diagnosis not present

## 2016-12-05 MED ORDER — BUDESONIDE-FORMOTEROL FUMARATE 160-4.5 MCG/ACT IN AERO: 2.0000 | INHALATION_SPRAY | Freq: Two times a day (BID) | RESPIRATORY_TRACT | 0 refills | Status: DC

## 2016-12-05 NOTE — Assessment & Plan Note (Signed)
Compensated on present regimen without any flare of symptoms   Plan  Patient Instructions  Continue on Symbicort 2 puffs Twice daily   Continue on Oxygen 2lm At bedtime  .  Pneumovax vaccine today .  Follow up with Dr. Elsworth Soho  In 6 months and As needed   Please contact office for sooner follow up if symptoms do not improve or worsen or seek emergency care

## 2016-12-05 NOTE — Assessment & Plan Note (Signed)
Controlled on O2   Plan  Patient Instructions  Continue on Symbicort 2 puffs Twice daily   Continue on Oxygen 2lm At bedtime  .  Pneumovax vaccine today .  Follow up with Dr. Elsworth Soho  In 6 months and As needed   Please contact office for sooner follow up if symptoms do not improve or worsen or seek emergency care

## 2016-12-05 NOTE — Patient Instructions (Addendum)
Continue on Symbicort 2 puffs Twice daily   Continue on Oxygen 2lm At bedtime  .  Pneumovax vaccine today .  Follow up with Dr. Elsworth Soho  In 6 months and As needed   Please contact office for sooner follow up if symptoms do not improve or worsen or seek emergency care

## 2016-12-05 NOTE — Addendum Note (Signed)
Addended by: Parke Poisson E on: 12/05/2016 04:21 PM   Modules accepted: Orders

## 2016-12-05 NOTE — Progress Notes (Signed)
@Patient  ID: [name] [name], female    DOB: [birth year], 83 y.o.   MRN: [id number]  Chief Complaint  Patient presents with  . Follow-up    Referring provider: Debbrah Alar, NP  HPI: 83 year old female former smoker followed for moderate stage II COPD with emphysema and chronic respiratory failure on nocturnal oxygen. Has systolic congestive heart failure   Significant tests/ events  Spirometry 2011 showed FEV1 74%, ratio 69, ++BD response with  Post BD FEV1 90%, ratio 74.   Echo 06/2015 EF was recovered to 60%  12/05/2016 Follow up ; COPD , O2 RF  Patient presents for a six-month follow-up.  Patient says that she is doing okay with her breathing.  She remains on Symbicort.  She denies any flare of cough or wheezing. She says she moves around home pretty easily without significant shortness of breath..  She is on oxygen 2 L at bedtime..  Flu and Prevnar vaccine are up-to-date.  Pneumovax was last given in 2006.   Allergies  Allergen Reactions  . Codeine Diarrhea and Nausea And Vomiting    REACTION: n/v/d, HA  . Morphine Diarrhea and Nausea And Vomiting    REACTION: n/v/d, HA  . Sulfa Antibiotics Nausea Only    Sick    . Zoledronic Acid Swelling    REACTION: Severe edema    Immunization History  Administered Date(s) Administered  . Influenza Split 12/05/2010, 10/31/2011  . Influenza Whole 01/16/2009, 11/01/2009  . Influenza, High Dose Seasonal PF 11/08/2014  . Influenza,inj,Quad PF,6+ Mos 11/01/2012, 10/31/2013, 10/09/2015  . Influenza,inj,Quad PF,6-35 Mos 11/14/2016  . Pneumococcal Conjugate-13 12/28/2012  . Pneumococcal Polysaccharide-23 02/13/2004  . Td 06/29/2015  . Zoster 06/23/2013    Past Medical History:  Diagnosis Date  . Abdominal pain    Resolved  . Cardiac arrhythmia   . CHF (congestive heart failure) ([place])    Diastolic  on ECHO 7867  . Choledocholithiasis   . Chronic anemia   . Chronic back pain   . COPD (chronic obstructive  pulmonary disease) (HCC)    oxygen dependent at  night 2LPM  . Daily headache   . GERD (gastroesophageal reflux disease)   . HCAP (healthcare-associated pneumonia) 03/28/2014  . Hiatal hernia   . Hyperlipidemia   . Hypertension   . On home oxygen therapy    "1L during the day; 2L q hs" (03/28/2014)  . Osteoarthritis    "pretty much q where"   . Osteoporosis   . Pancolitis (HCC)    Infectious vs. inflammatory  . Pancreatitis    History of  . Pneumonia 1930's; 2011 X 2; 02/2014  . PONV (postoperative nausea and vomiting)   . PUD (peptic ulcer disease)   . Reactive airway disease that is not asthma   . Recurrent UTI (urinary tract infection)    "here lately" (03/28/2014)  . Scoliosis deformity of spine    history of intractable back pain    Tobacco History: Social History   Tobacco Use  Smoking Status Former Smoker  . Packs/day: 1.00  . Years: 12.00  . Pack years: 12.00  . Types: Cigarettes  . Last attempt to quit: 01/28/1988  . Years since quitting: 28.8  Smokeless Tobacco Never Used   Counseling given: Not Answered   Outpatient Encounter Medications as of 12/05/2016  Medication Sig  . acetaminophen (TYLENOL) 500 MG tablet Take 500 mg by mouth 2 (two) times daily as needed for headache.  . albuterol (PROVENTIL HFA;VENTOLIN HFA) 108 (90 Base) MCG/ACT inhaler Inhale  1-2 puffs into the lungs every 6 (six) hours as needed (wheezing & shortness of breath). Reported on 02/28/2015  . Ascorbic Acid (VITAMIN C) 500 MG tablet Take 500 mg by mouth daily.    . Aspirin-Acetaminophen-Caffeine (GOODY HEADACHE PO) Take by mouth.  . budesonide-formoterol (SYMBICORT) 160-4.5 MCG/ACT inhaler Inhale 2 puffs into the lungs 2 (two) times daily.  . butalbital-acetaminophen-caffeine (FIORICET, ESGIC) 50-325-40 MG tablet Take 1 tablet by mouth every 4 (four) hours as needed for headache.   . Calcium Carbonate-Vitamin D (CALCIUM 600-D) 600-400 MG-UNIT per tablet Take 1 tablet by mouth 2 (two) times daily  with a meal.    . cholecalciferol (VITAMIN D) 1000 units tablet Take 1,000 Units by mouth daily.  . diclofenac sodium (VOLTAREN) 1 % GEL Place 1 application onto the skin 2 (two) times daily as needed.  . gabapentin (NEURONTIN) 100 MG capsule Take 1 capsule (100 mg total) by mouth 2 (two) times daily.  . hyoscyamine (LEVSIN SL) 0.125 MG SL tablet Take 0.125 mg by mouth as needed. Abdominal pain  . lactulose (CHRONULAC) 10 GM/15ML solution 2 teaspoonful at bedtime as needed for constipation  . Multiple Vitamins-Minerals (MULTIVITAMIN WITH MINERALS) tablet Take 1 tablet by mouth daily.  . nitroGLYCERIN (NITROSTAT) 0.4 MG SL tablet Place 1 tablet (0.4 mg total) under the tongue every 5 (five) minutes as needed for chest pain.  [name] omeprazole (PRILOSEC) 40 MG capsule Take 1 capsule (40 mg total) by mouth 2 (two) times daily.  [name] oxyCODONE-acetaminophen (PERCOCET) 7.5-325 MG tablet Take 1 tablet by mouth 2 (two) times daily as needed for severe pain.  . Probiotic Product (PROBIOTIC DAILY PO) Take 1 tablet by mouth daily at 12 noon.  . promethazine (PHENERGAN) 12.5 MG tablet Take 1 tablet (12.5 mg total) by mouth every 8 (eight) hours as needed for nausea or vomiting.  [name] VITAMIN E PO Take 1 tablet by mouth daily.   No facility-administered encounter medications on file as of 12/05/2016.      Review of Systems  Constitutional:   No  weight loss, night sweats,  Fevers, chills,   +fatigue, or  lassitude.  HEENT:   No headaches,  Difficulty swallowing,  Tooth/dental problems, or  Sore throat,                No sneezing, itching, ear ache, nasal congestion, post nasal drip,   CV:  No chest pain,  Orthopnea, PND, swelling in lower extremities, anasarca, dizziness, palpitations, syncope.   GI  No heartburn, indigestion, abdominal pain, nausea, vomiting, diarrhea, change in bowel habits, loss of appetite, bloody stools.   Resp: No excess mucus, no productive cough,  No non-productive cough,  No coughing  up of blood.  No change in color of mucus.  No wheezing.  No chest wall deformity  Skin: no rash or lesions.  GU: no dysuria, change in color of urine, no urgency or frequency.  No flank pain, no hematuria      Physical Exam  BP 128/64 (BP Location: Left Arm, Cuff Size: Normal)   Pulse 85   Ht 5' (1.524 m)   Wt 103 lb 9.6 oz (47 kg)   SpO2 93%   BMI 20.23 kg/m   GEN: A/Ox3; pleasant , NAD, elderly and frail   HEENT:  [place]/AT,  EACs-clear, TMs-wnl, NOSE-clear, THROAT-clear, no lesions, no postnasal drip or exudate noted.   NECK:  Supple w/ fair ROM; no JVD; normal carotid impulses w/o bruits; no thyromegaly or nodules palpated; no  lymphadenopathy.    RESP decreased breath sounds in the bases  no accessory muscle use, no dullness to percussion  CARD:  RRR, no m/r/g, no peripheral edema, pulses intact, no cyanosis or clubbing.  GI:   Soft & nt; nml bowel sounds; no organomegaly or masses detected.   Musco: Warm bil, no deformities or joint swelling noted.   Neuro: alert, no focal deficits noted.    Skin: Warm, no lesions or rashes    Lab Results:   BMET  BNP  Imaging: No results found.   Assessment & Plan:   COPD with emphysema, gold stage C. Compensated on present regimen without any flare of symptoms   Plan  Patient Instructions  Continue on Symbicort 2 puffs Twice daily   Continue on Oxygen 2lm At bedtime  .  Pneumovax vaccine today .  Follow up with Dr. Elsworth Soho  In 6 months and As needed   Please contact office for sooner follow up if symptoms do not improve or worsen or seek emergency care      Chronic respiratory failure ([place]) Controlled on O2   Plan  Patient Instructions  Continue on Symbicort 2 puffs Twice daily   Continue on Oxygen 2lm At bedtime  .  Pneumovax vaccine today .  Follow up with Dr. Elsworth Soho  In 6 months and As needed   Please contact office for sooner follow up if symptoms do not improve or worsen or seek emergency care          Rexene Edison, NP 12/05/2016

## 2016-12-12 NOTE — Progress Notes (Signed)
Reviewed & agree with plan  

## 2016-12-24 DIAGNOSIS — H25812 Combined forms of age-related cataract, left eye: Secondary | ICD-10-CM | POA: Diagnosis not present

## 2016-12-24 DIAGNOSIS — Z961 Presence of intraocular lens: Secondary | ICD-10-CM | POA: Diagnosis not present

## 2016-12-24 DIAGNOSIS — H52202 Unspecified astigmatism, left eye: Secondary | ICD-10-CM | POA: Diagnosis not present

## 2016-12-24 DIAGNOSIS — H2512 Age-related nuclear cataract, left eye: Secondary | ICD-10-CM | POA: Diagnosis not present

## 2016-12-31 ENCOUNTER — Other Ambulatory Visit: Payer: Self-pay | Admitting: Family

## 2016-12-31 NOTE — Telephone Encounter (Signed)
[name]-- please advise lactulose request?

## 2017-01-01 ENCOUNTER — Other Ambulatory Visit: Payer: Self-pay | Admitting: Family

## 2017-01-01 MED ORDER — LACTULOSE 10 GM/15ML PO SOLN: ORAL | 0 refills | Status: DC

## 2017-01-14 DIAGNOSIS — G8929 Other chronic pain: Secondary | ICD-10-CM | POA: Diagnosis not present

## 2017-01-14 DIAGNOSIS — Z9689 Presence of other specified functional implants: Secondary | ICD-10-CM | POA: Diagnosis not present

## 2017-01-14 DIAGNOSIS — M545 Low back pain: Secondary | ICD-10-CM | POA: Diagnosis not present

## 2017-01-14 DIAGNOSIS — R51 Headache: Secondary | ICD-10-CM | POA: Diagnosis not present

## 2017-01-14 DIAGNOSIS — G894 Chronic pain syndrome: Secondary | ICD-10-CM | POA: Diagnosis not present

## 2017-02-04 DIAGNOSIS — R1013 Epigastric pain: Secondary | ICD-10-CM | POA: Diagnosis not present

## 2017-02-04 DIAGNOSIS — D500 Iron deficiency anemia secondary to blood loss (chronic): Secondary | ICD-10-CM | POA: Diagnosis not present

## 2017-02-10 ENCOUNTER — Encounter: Payer: Self-pay | Admitting: Pulmonary Disease

## 2017-03-02 DIAGNOSIS — G8929 Other chronic pain: Secondary | ICD-10-CM | POA: Diagnosis not present

## 2017-03-02 DIAGNOSIS — R51 Headache: Secondary | ICD-10-CM | POA: Diagnosis not present

## 2017-03-02 DIAGNOSIS — G894 Chronic pain syndrome: Secondary | ICD-10-CM | POA: Diagnosis not present

## 2017-03-02 DIAGNOSIS — Z9689 Presence of other specified functional implants: Secondary | ICD-10-CM | POA: Diagnosis not present

## 2017-03-02 DIAGNOSIS — M545 Low back pain: Secondary | ICD-10-CM | POA: Diagnosis not present

## 2017-04-13 ENCOUNTER — Other Ambulatory Visit: Payer: Self-pay | Admitting: Family

## 2017-04-16 DIAGNOSIS — G8929 Other chronic pain: Secondary | ICD-10-CM | POA: Diagnosis not present

## 2017-04-16 DIAGNOSIS — M5136 Other intervertebral disc degeneration, lumbar region: Secondary | ICD-10-CM | POA: Diagnosis not present

## 2017-04-16 DIAGNOSIS — Z9689 Presence of other specified functional implants: Secondary | ICD-10-CM | POA: Diagnosis not present

## 2017-04-16 DIAGNOSIS — M545 Low back pain: Secondary | ICD-10-CM | POA: Diagnosis not present

## 2017-04-27 ENCOUNTER — Ambulatory Visit (HOSPITAL_BASED_OUTPATIENT_CLINIC_OR_DEPARTMENT_OTHER)
Admission: RE | Admit: 2017-04-27 | Discharge: 2017-04-27 | Disposition: A | Discharge status: Home / Self Care | Payer: Medicare Other | Source: Ambulatory Visit | Attending: Family | Admitting: Family

## 2017-04-27 ENCOUNTER — Other Ambulatory Visit: Payer: Self-pay | Admitting: Family

## 2017-04-27 ENCOUNTER — Telehealth: Payer: Self-pay | Admitting: *Deleted

## 2017-04-27 ENCOUNTER — Ambulatory Visit (INDEPENDENT_AMBULATORY_CARE_PROVIDER_SITE_OTHER): Payer: Medicare Other | Admitting: Family

## 2017-04-27 ENCOUNTER — Encounter: Payer: Self-pay | Admitting: Family

## 2017-04-27 VITALS — BP 136/74 | HR 91 | Temp 98.7°F | Resp 16 | Ht 60.0 in | Wt 103.0 lb

## 2017-04-27 DIAGNOSIS — R9082 White matter disease, unspecified: Secondary | ICD-10-CM | POA: Diagnosis not present

## 2017-04-27 DIAGNOSIS — J029 Acute pharyngitis, unspecified: Secondary | ICD-10-CM | POA: Diagnosis not present

## 2017-04-27 DIAGNOSIS — J020 Streptococcal pharyngitis: Secondary | ICD-10-CM

## 2017-04-27 DIAGNOSIS — G319 Degenerative disease of nervous system, unspecified: Secondary | ICD-10-CM | POA: Diagnosis not present

## 2017-04-27 DIAGNOSIS — R404 Transient alteration of awareness: Secondary | ICD-10-CM | POA: Diagnosis not present

## 2017-04-27 DIAGNOSIS — R350 Frequency of micturition: Secondary | ICD-10-CM | POA: Diagnosis not present

## 2017-04-27 LAB — POC URINALSYSI DIPSTICK (AUTOMATED)
Bilirubin, UA: NEGATIVE
Blood, UA: NEGATIVE
Glucose, UA: NEGATIVE
Leukocytes, UA: NEGATIVE
Nitrite, UA: NEGATIVE
Protein, UA: NEGATIVE
Spec Grav, UA: 1.025 (ref 1.010–1.025)
Urobilinogen, UA: 0.2 E.U./dL
pH, UA: 6 (ref 5.0–8.0)

## 2017-04-27 LAB — POCT RAPID STREP A (OFFICE): Rapid Strep A Screen: POSITIVE — AB

## 2017-04-27 MED ORDER — CEPHALEXIN 500 MG PO CAPS: 500.0000 mg | ORAL_CAPSULE | Freq: Two times a day (BID) | ORAL | 0 refills | Status: DC

## 2017-04-27 MED ORDER — ASPIRIN 81 MG PO TABS: 81.0000 mg | ORAL_TABLET | Freq: Every day | ORAL | Status: DC

## 2017-04-27 MED ORDER — CEFDINIR 300 MG PO CAPS: 300.0000 mg | ORAL_CAPSULE | Freq: Two times a day (BID) | ORAL | 0 refills | Status: DC

## 2017-04-27 MED FILL — CEFDINIR 300 MG CAPS: 300 | 10 days supply | Qty: 20 | Fill #0

## 2017-04-27 NOTE — Patient Instructions (Addendum)
Please complete lab work prior to leaving. Complete CT scan on the first floor. Begin cefdinir for possible Urinary tract infection, and definite strep throat.  Call 911/go to the ER if you develop worsening weakness, numbness, recurrent confusion.

## 2017-04-27 NOTE — Progress Notes (Signed)
Subjective:    Patient ID: [name] [name], female    DOB: 1925-04-28, 84 y.o.   MRN: [id number]  HPI  [name] [name] is an 84 yr old female who presents today with her daughter due to altered mental status.  Daughter reports that pt went to church yesterday and then out to lunch at the [employer]. She then became very weak, per daughter she was "stumbling when she was walking" and also "saying some inappropriate things."  Reports patient was trying to cook some cinnamon rolls and kept dropping them.  She was also noted to be very short of breath at that time. She had some urinary incontinence and frequency.  Around 6 PM she was still weak but was feeling better and mental status had returned to normal so daughter left and let patient stay alone last night.  Reports that she still feels weak and off balance.  She also reports c/o urinary frequency.  Denies dysuria or fever.  She does have a "terrible headache" but reports HA is at her baseline chronic status and she has had it for "years." she also reports  + sore throat.  Yesterday she told her daughter that she had some epigastric pain but this has resolved.  Had mild nausea with one episode of vomiting yesterday  "like acid in the throat."    Of note, the daughter called our online service twice yesterday and was advised to call 911 both times.  Review of Systems    see HPI  Past Medical History:  Diagnosis Date  . Abdominal pain    Resolved  . Cardiac arrhythmia   . CHF (congestive heart failure) ([place])    Diastolic  on ECHO 3662  . Choledocholithiasis   . Chronic anemia   . Chronic back pain   . COPD (chronic obstructive pulmonary disease) ([facility])    oxygen dependent at  night 2LPM  . Daily headache   . GERD (gastroesophageal reflux disease)   . HCAP (healthcare-associated pneumonia) 03/28/2014  . Hiatal hernia   . Hyperlipidemia   . Hypertension   . On home oxygen therapy    "1L during the day; 2L q hs" (03/28/2014)  . Osteoarthritis    "pretty much q where"   . Osteoporosis   . Pancolitis ([facility])    Infectious vs. inflammatory  . Pancreatitis    History of  . Pneumonia 1930's; 2011 X 2; 02/2014  . PONV (postoperative nausea and vomiting)   . PUD (peptic ulcer disease)   . Reactive airway disease that is not asthma   . Recurrent UTI (urinary tract infection)    "here lately" (03/28/2014)  . Scoliosis deformity of spine    history of intractable back pain     Social History   Socioeconomic History  . Marital status: Widowed    Spouse name: Not on file  . Number of children: 3  . Years of education: Not on file  . Highest education level: Not on file  Occupational History    Employer: RETIRED  Social Needs  . Financial resource strain: Not on file  . Food insecurity:    Worry: Not on file    Inability: Not on file  . Transportation needs:    Medical: Not on file    Non-medical: Not on file  Tobacco Use  . Smoking status: Former Smoker    Packs/day: 1.00    Years: 12.00    Pack years: 12.00    Types: Cigarettes  Last attempt to quit: 01/28/1988    Years since quitting: 29.2  . Smokeless tobacco: Never Used  Substance and Sexual Activity  . Alcohol use: No  . Drug use: No  . Sexual activity: Never  Lifestyle  . Physical activity:    Days per week: Not on file    Minutes per session: Not on file  . Stress: Not on file  Relationships  . Social connections:    Talks on phone: Not on file    Gets together: Not on file    Attends religious service: Not on file    Active member of club or organization: Not on file    Attends meetings of clubs or organizations: Not on file    Relationship status: Not on file  . Intimate partner violence:    Fear of current or ex partner: Not on file    Emotionally abused: Not on file    Physically abused: Not on file    Forced sexual activity: Not on file  Other Topics Concern  . Not on file  Social History Narrative   1 grandchild at care link--Angelia   Lives  with great-granddaughter    Past Surgical History:  Procedure Laterality Date  . ABDOMINAL HYSTERECTOMY  1970s  . BACK SURGERY     x 5  . BREAST CYST EXCISION Right 1960's  . CERVICAL LAMINECTOMY  1970s  . CHOLECYSTECTOMY  2008  . ERCP W/ SPHICTEROTOMY  02/2010  . ESOPHAGOGASTRODUODENOSCOPY N/A 11/14/2013   Procedure: ESOPHAGOGASTRODUODENOSCOPY (EGD);  Surgeon: Missy Sabins, MD;  Location: [facility] ENDOSCOPY;  Service: Endoscopy;  Laterality: N/A;  . FLEXIBLE SIGMOIDOSCOPY N/A 01/24/2013   Procedure: FLEXIBLE SIGMOIDOSCOPY;  Surgeon: Missy Sabins, MD;  Location: [facility];  Service: Endoscopy;  Laterality: N/A;  . PAIN PUMP IMPLANTATION  ~ 2010   back, dilaudid and bupravacaine  . SPINAL CORD STIMULATOR IMPLANT  ~ 2009  . SPINE SURGERY  1970's,1995,2007    Family History  Problem Relation Age of Onset  . Cancer Mother        uterine  . Heart disease Father   . Hypertension Other   . Osteoporosis Neg Hx     Allergies  Allergen Reactions  . Codeine Diarrhea and Nausea And Vomiting    REACTION: n/v/d, HA  . Morphine Diarrhea and Nausea And Vomiting    REACTION: n/v/d, HA  . Sulfa Antibiotics Nausea Only    Sick    . Zoledronic Acid Swelling    REACTION: Severe edema    Current Outpatient Medications on File Prior to Visit  Medication Sig Dispense Refill  . acetaminophen (TYLENOL) 500 MG tablet Take 500 mg by mouth 2 (two) times daily as needed for headache.    . albuterol (PROVENTIL HFA;VENTOLIN HFA) 108 (90 Base) MCG/ACT inhaler Inhale 1-2 puffs into the lungs every 6 (six) hours as needed (wheezing & shortness of breath). Reported on 02/28/2015 1 Inhaler 5  . Ascorbic Acid (VITAMIN C) 500 MG tablet Take 500 mg by mouth daily.      . Aspirin-Acetaminophen-Caffeine (GOODY HEADACHE PO) Take by mouth.    . budesonide-formoterol (SYMBICORT) 160-4.5 MCG/ACT inhaler Inhale 2 puffs 2 (two) times daily into the lungs. 2 Inhaler 0  . butalbital-acetaminophen-caffeine (FIORICET,  ESGIC) 50-325-40 MG tablet Take 1 tablet by mouth every 4 (four) hours as needed for headache.     . Calcium Carbonate-Vitamin D (CALCIUM 600-D) 600-400 MG-UNIT per tablet Take 1 tablet by mouth 2 (two) times daily with a  meal.      . cholecalciferol (VITAMIN D) 1000 units tablet Take 1,000 Units by mouth daily.    . diclofenac sodium (VOLTAREN) 1 % GEL Place 1 application onto the skin 2 (two) times daily as needed.    . gabapentin (NEURONTIN) 100 MG capsule Take 1 capsule (100 mg total) by mouth 2 (two) times daily. 60 capsule 3  . hyoscyamine (LEVSIN SL) 0.125 MG SL tablet Take 0.125 mg by mouth as needed. Abdominal pain    . lactulose (CHRONULAC) 10 GM/15ML solution TAKE 2 TEASPOONFULS BY MOUTH AT BEDTIME AS NEEDED FOR CONSTIPATION 500 mL 0  . lactulose (CHRONULAC) 10 GM/15ML solution TAKE 2 TEASPOONSFUL BY MOUTH AT BEDTIME AS NEEDED FOR CONSTIPATION 236 mL 0  . Multiple Vitamins-Minerals (MULTIVITAMIN WITH MINERALS) tablet Take 1 tablet by mouth daily. 30 tablet   . nitroGLYCERIN (NITROSTAT) 0.4 MG SL tablet Place 1 tablet (0.4 mg total) under the tongue every 5 (five) minutes as needed for chest pain. 100 tablet 3  . omeprazole (PRILOSEC) 40 MG capsule Take 1 capsule (40 mg total) by mouth 2 (two) times daily. 60 capsule 3  . oxyCODONE-acetaminophen (PERCOCET) 7.5-325 MG tablet Take 1 tablet by mouth 2 (two) times daily as needed for severe pain.    . Probiotic Product (PROBIOTIC DAILY PO) Take 1 tablet by mouth daily at 12 noon.    . promethazine (PHENERGAN) 12.5 MG tablet Take 1 tablet (12.5 mg total) by mouth every 8 (eight) hours as needed for nausea or vomiting. 20 tablet 0  . VITAMIN E PO Take 1 tablet by mouth daily.     No current facility-administered medications on file prior to visit.     BP 136/74 (BP Location: Right Arm, Patient Position: Sitting, Cuff Size: Small)   Pulse 91   Temp 98.7 F (37.1 C) (Oral)   Resp 16   Ht 5' (1.524 m)   Wt 103 lb (46.7 kg)   SpO2 94%    BMI 20.12 kg/m    Objective:   Physical Exam  Constitutional: She is oriented to person, place, and time. She appears well-developed and well-nourished.  HENT:  Right Ear: Tympanic membrane and ear canal normal.  Left Ear: Tympanic membrane and ear canal normal.  Mouth/Throat: Posterior oropharyngeal erythema present. No oropharyngeal exudate or posterior oropharyngeal edema.  Cardiovascular: Normal rate, regular rhythm and normal heart sounds.  No murmur heard. Pulmonary/Chest: Effort normal and breath sounds normal. No respiratory distress. She has no wheezes.  Musculoskeletal: She exhibits no edema.  Neurological: She is alert and oriented to person, place, and time.  Skin: Skin is warm and dry.  Psychiatric: She has a normal mood and affect. Her behavior is normal. Judgment and thought content normal.          Assessment & Plan:  Strep pharyngitis- Rapid strep positive.  Rx with cefdinir for strep and urinary coverage.  Urinary frequency- UA is unremarkable, send for culture, cover with abx (cefdinir) empirically given recent AMS.  AMS- now back to baseline.  EKG tracing is personally reviewed.  EKG notes NSR.  No acute changes. Possible causes include TIA/CVA, UTI, Acute strep infection.  Will obtain cmet, cbc, TSH, RPR as well as a CT head.  I have advised pt and daughter to  call 911/go to the ER if you develop worsening weakness, numbness, recurrent confusion. They verbalize understanding and are agreeable to plan.     EKG tracing is personally reviewed.  EKG notes NSR.  No  acute changes.

## 2017-04-27 NOTE — Telephone Encounter (Signed)
Received [facility] note; patient has been seen in office by PCP today/SLS 04/01  [facility] note received via fax  Call:   Date: 04/26/17 Time: 5:35p & 6:00p   Caller: [name] [daughter] Return number: [phone] primary, [phone] secondary, [phone] alternate  Nurse: Ricard Dillon, RN  Chief Complaint: Confusion, new onset  Reason for call: Symptomatic; "caller's mother is walking like she is drunk, has confusion, nausea, daughter thinks she has a UTI. Happening since 1p, but noticed that she was slower than usual early this am"  Related visit to physician within the last 2 weeks:  Guideline: "Neurologic Deficit [1] Severe weakness [I.e. Unable to walk or barely able to walk, (requires support) AND [2] new onset or worsening"  Disposition:  Send to Urgent Que, 911 Outcome, Call EMS 911 NOW "[name] [name] states that she wishes to wait for her daughter, RN, to get home. Does not want to call 911 at this time."

## 2017-04-27 NOTE — Telephone Encounter (Signed)
Pt seen today

## 2017-04-27 NOTE — Progress Notes (Signed)
Keflex was cancelled at her pharmacy.

## 2017-04-29 ENCOUNTER — Telehealth: Payer: Self-pay | Admitting: Family

## 2017-04-29 LAB — COMPLETE METABOLIC PANEL WITH GFR
AG Ratio: 1.8 (calc) (ref 1.0–2.5)
ALT: 14 U/L (ref 6–29)
AST: 22 U/L (ref 10–35)
Albumin: 3.7 g/dL (ref 3.6–5.1)
Alkaline phosphatase (APISO): 87 U/L (ref 33–130)
BUN/Creatinine Ratio: 21 (calc) (ref 6–22)
BUN: 19 mg/dL (ref 7–25)
CO2: 32 mmol/L (ref 20–32)
Calcium: 8.1 mg/dL — ABNORMAL LOW (ref 8.6–10.4)
Chloride: 103 mmol/L (ref 98–110)
Creat: 0.89 mg/dL — ABNORMAL HIGH (ref 0.60–0.88)
GFR, Est African American: 67 mL/min/{1.73_m2} (ref >=60)
GFR, Est Non African American: 57 mL/min/{1.73_m2} — ABNORMAL LOW (ref >=60)
Globulin: 2.1 g/dL (calc) (ref 1.9–3.7)
Glucose, Bld: 83 mg/dL (ref 65–99)
Potassium: 4.4 mmol/L (ref 3.5–5.3)
Sodium: 143 mmol/L (ref 135–146)
Total Bilirubin: 0.2 mg/dL (ref 0.2–1.2)
Total Protein: 5.8 g/dL — ABNORMAL LOW (ref 6.1–8.1)

## 2017-04-29 LAB — TEST AUTHORIZATION

## 2017-04-29 LAB — RPR: RPR Ser Ql: NONREACTIVE

## 2017-04-29 LAB — TSH: TSH: 0.92 mIU/L (ref 0.40–4.50)

## 2017-04-29 NOTE — Telephone Encounter (Signed)
Lab work looks good except calcium and protein a little low in her blood. Make sure to take caltrate 600mg  +D twice daily and increase protein in her diet. We will plan to repeat at her follow up in 2 weeks.

## 2017-04-29 NOTE — Telephone Encounter (Signed)
Notified pt and she voices understanding. 

## 2017-05-04 ENCOUNTER — Ambulatory Visit: Payer: Self-pay

## 2017-05-04 ENCOUNTER — Encounter: Payer: Self-pay | Admitting: Family

## 2017-05-04 MED ORDER — AZITHROMYCIN 250 MG PO TABS: ORAL_TABLET | ORAL | 0 refills | Status: DC

## 2017-05-04 NOTE — Telephone Encounter (Signed)
Notified pt and she voices understanding. Reports that she" thinks all oral antibiotics cause her to have diarrhea but she seems to do ok with injections". Advised pt to let us know if diarrhea returns after starting new antibiotic and pt is agreeable.

## 2017-05-04 NOTE — Telephone Encounter (Signed)
Pt's daughter called.  Reported pt. Had severe diarrhea about 3 days after starting the Cefdinir.  The pt. Took Immondium without any relief.  Reported she called and spoke to an after hours nurse on Saturday, and was told to stop the antibiotic.  Daughter reported diarrhea has subsided since she stopped antibiotic.  Reported the pt. Is drinking fluids for hydration.  Denied  pt. C/o fever, nausea/ vomiting or other concerns.  Last dose of antibiotic was taken on Saturday evening.  Advised will make Debbrah Alar aware of the above.      Answer Assessment - Initial Assessment Questions 1. DIARRHEA SEVERITY: "How bad is the diarrhea?" "How many extra stools have you had in the past 24 hours than normal?"    - MILD: Few loose or mushy BMs; increase of 1-3 stools over normal daily number of stools; mild increase in ostomy output.   - MODERATE: Increase of 4-6 stools daily over normal; moderate increase in ostomy output.   - SEVERE (or Worst Possible): Increase of 7 or more stools daily over normal; moderate increase in ostomy output; incontinence.     Diarrhea was severe while on antibiotic (12-15 x over 24 hrs) 2. ONSET: "When did the diarrhea begin?"      Started on 4/4; 3 days after starting antibiotic 3. BM CONSISTENCY: "How loose or watery is the diarrhea?"      Daughter is unsure 4. VOMITING: "Are you also vomiting?" If so, ask: "How many times in the past 24 hours?"     No 5. ABDOMINAL PAIN: "Are you having any abdominal pain?" If yes: "What does it feel like?" (e.g., crampy, dull, intermittent, constant)      Cramping with the diarrhea; has subsided after the antibiotic stopped 6. ABDOMINAL PAIN SEVERITY: If present, ask: "How bad is the pain?"  (e.g., Scale 1-10; mild, moderate, or severe)    - MILD (1-3): doesn't interfere with normal activities, abdomen soft and not tender to touch     - MODERATE (4-7): interferes with normal activities or awakens from sleep, tender to touch     -  SEVERE (8-10): excruciating pain, doubled over, unable to do any normal activities       Subsided  7. ORAL INTAKE: If vomiting, "Have you been able to drink liquids?" "How much fluids have you had in the past 24 hours?"     Taking fluids  8. HYDRATION: "Any signs of dehydration?" (e.g., dry mouth [not just dry lips], too weak to stand, dizziness, new weight loss) "When did you last urinate?"     Daughter stated she is drinking fluids  9. EXPOSURE: "Have you traveled to a foreign country recently?" "Have you been exposed to anyone with diarrhea?" "Could you have eaten any food that was spoiled?"     no 10. OTHER SYMPTOMS: "Do you have any other symptoms?" (e.g., fever, blood in stool)      Per daughter no fever, nausea, vomiting; diarrhea subsided after stopping antibiotic 11. PREGNANCY: "Is there any chance you are pregnant?" "When was your last menstrual period?"       No  Protocols used: DIARRHEA-A-AH

## 2017-05-04 NOTE — Telephone Encounter (Signed)
Remain off cefdinir, start zpak.

## 2017-05-04 NOTE — Addendum Note (Signed)
Addended by: Debbrah Alar on: 05/04/2017 10:42 AM   Modules accepted: Orders

## 2017-05-05 ENCOUNTER — Telehealth: Payer: Self-pay | Admitting: Family

## 2017-05-05 NOTE — Telephone Encounter (Signed)
Prolia benefits received [place] not required $185 deductible met Secondary will cover 20% co-insurance   Patient may owe approximately $0 OOP  Patient due after 05/18/17  Letter mailed to inform patient of benefits and to schedule

## 2017-05-06 NOTE — Telephone Encounter (Signed)
Medication ordered. Left message for pt to return my call to schedule nurse visit for Prolia after 05/18/17.

## 2017-05-08 NOTE — Telephone Encounter (Signed)
Medication received.

## 2017-05-13 NOTE — Telephone Encounter (Signed)
Pt is scheduled to see PCP on 05/22/17 and will give Prolia at that visit.

## 2017-05-18 ENCOUNTER — Telehealth: Payer: Self-pay

## 2017-05-18 NOTE — Telephone Encounter (Signed)
Tried to reach patient today to get her scheduled for Prolia- no one answered and no vm set up- patient owes $0 and is ready to be scheduled for a nurse visit

## 2017-05-22 ENCOUNTER — Encounter: Payer: Self-pay | Admitting: Family

## 2017-05-22 ENCOUNTER — Ambulatory Visit (HOSPITAL_BASED_OUTPATIENT_CLINIC_OR_DEPARTMENT_OTHER)
Admission: RE | Admit: 2017-05-22 | Discharge: 2017-05-22 | Disposition: A | Discharge status: Home / Self Care | Payer: Medicare Other | Source: Ambulatory Visit | Attending: Family | Admitting: Family

## 2017-05-22 ENCOUNTER — Ambulatory Visit (INDEPENDENT_AMBULATORY_CARE_PROVIDER_SITE_OTHER): Payer: Medicare Other | Admitting: Family

## 2017-05-22 DIAGNOSIS — M4186 Other forms of scoliosis, lumbar region: Secondary | ICD-10-CM | POA: Diagnosis not present

## 2017-05-22 DIAGNOSIS — M5136 Other intervertebral disc degeneration, lumbar region: Secondary | ICD-10-CM | POA: Insufficient documentation

## 2017-05-22 DIAGNOSIS — M549 Dorsalgia, unspecified: Secondary | ICD-10-CM | POA: Diagnosis not present

## 2017-05-22 DIAGNOSIS — R51 Headache: Secondary | ICD-10-CM

## 2017-05-22 DIAGNOSIS — I700 Atherosclerosis of aorta: Secondary | ICD-10-CM | POA: Diagnosis not present

## 2017-05-22 DIAGNOSIS — M545 Low back pain: Secondary | ICD-10-CM | POA: Diagnosis not present

## 2017-05-22 DIAGNOSIS — R1013 Epigastric pain: Secondary | ICD-10-CM

## 2017-05-22 DIAGNOSIS — M818 Other osteoporosis without current pathological fracture: Secondary | ICD-10-CM | POA: Diagnosis not present

## 2017-05-22 DIAGNOSIS — R634 Abnormal weight loss: Secondary | ICD-10-CM

## 2017-05-22 DIAGNOSIS — G459 Transient cerebral ischemic attack, unspecified: Secondary | ICD-10-CM

## 2017-05-22 DIAGNOSIS — Z87891 Personal history of nicotine dependence: Secondary | ICD-10-CM

## 2017-05-22 DIAGNOSIS — R519 Headache, unspecified: Secondary | ICD-10-CM

## 2017-05-22 MED ORDER — DENOSUMAB 60 MG/ML ~~LOC~~ SOSY
60.0000 mg | PREFILLED_SYRINGE | Freq: Once | SUBCUTANEOUS | Status: AC
  Administered 2017-05-22: 60 mg via SUBCUTANEOUS

## 2017-05-22 NOTE — Progress Notes (Signed)
Subjective:    Patient ID: [name] [name], female    DOB: Oct 16, 1925, 84 y.o.   MRN: [id number]  HPI  Patient is an 84 year old female who presents today  for follow-up.  We saw her 2 weeks ago and she was treated for strep pharyngitis.  She had experienced an episode of altered mental status prior to her visit.  Thyroid function, RPR, complete metabolic panel were all unremarkable with the exception of low protein level and mild hypocalcemia.  CT of the head was performed which noted diffuse cortical atrophy as well as mild chronic ischemic white matter disease.  No acute intracranial abnormality was seen.  Daughter reports that she had about 4 hours of confusion.  Daughter tried to get her to go to the ER but she declined.  Did not have slurred speech, stumbling, facial drooping.   Osteoporosis-she presents today for Prolia injection.  Headaches- pt reports recent worsening of headaches.  She reports chronic daily headaches but that they seem to be worse the last 1 mont.   Pain is typically on the right frontal area.   Back pain- reports increased burning/pain in her back.  She follows at [facility] pain clinic in [place]- she has an implantable pain pump.     Wt Readings from Last 3 Encounters:  05/22/17 99 lb (44.9 kg)  04/27/17 103 lb (46.7 kg)  12/05/16 103 lb 9.6 oz (47 kg)     Review of Systems See HPI  Past Medical History:  Diagnosis Date  . Abdominal pain    Resolved  . Cardiac arrhythmia   . CHF (congestive heart failure) ([facility])    Diastolic  on ECHO 8413  . Choledocholithiasis   . Chronic anemia   . Chronic back pain   . COPD (chronic obstructive pulmonary disease) (H[facility])    oxygen dependent at  night 2LPM  . Daily headache   . GERD (gastroesophageal reflux disease)   . HCAP (healthcare-associated pneumonia) 03/28/2014  . Hiatal hernia   . Hyperlipidemia   . Hypertension   . On home oxygen therapy    "1L during the day; 2L q hs" (03/28/2014)  . Osteoarthritis      "pretty much q where"   . Osteoporosis   . Pancolitis (H[facility])    Infectious vs. inflammatory  . Pancreatitis    History of  . Pneumonia 1930's; 2011 X 2; 02/2014  . PONV (postoperative nausea and vomiting)   . PUD (peptic ulcer disease)   . Reactive airway disease that is not asthma   . Recurrent UTI (urinary tract infection)    "here lately" (03/28/2014)  . Scoliosis deformity of spine    history of intractable back pain     Social History   Socioeconomic History  . Marital status: Widowed    Spouse name: Not on file  . Number of children: 3  . Years of education: Not on file  . Highest education level: Not on file  Occupational History    Employer: RETIRED  Social Needs  . Financial resource strain: Not on file  . Food insecurity:    Worry: Not on file    Inability: Not on file  . Transportation needs:    Medical: Not on file    Non-medical: Not on file  Tobacco Use  . Smoking status: Former Smoker    Packs/day: 1.00    Years: 12.00    Pack years: 12.00    Types: Cigarettes    Last attempt to quit: 01/28/1988  Years since quitting: 29.3  . Smokeless tobacco: Never Used  Substance and Sexual Activity  . Alcohol use: No  . Drug use: No  . Sexual activity: Never  Lifestyle  . Physical activity:    Days per week: Not on file    Minutes per session: Not on file  . Stress: Not on file  Relationships  . Social connections:    Talks on phone: Not on file    Gets together: Not on file    Attends religious service: Not on file    Active member of club or organization: Not on file    Attends meetings of clubs or organizations: Not on file    Relationship status: Not on file  . Intimate partner violence:    Fear of current or ex partner: Not on file    Emotionally abused: Not on file    Physically abused: Not on file    Forced sexual activity: Not on file  Other Topics Concern  . Not on file  Social History Narrative   1 grandchild at care link--Angelia    Lives with great-granddaughter    Past Surgical History:  Procedure Laterality Date  . ABDOMINAL HYSTERECTOMY  1970s  . BACK SURGERY     x 5  . BREAST CYST EXCISION Right 1960's  . CERVICAL LAMINECTOMY  1970s  . CHOLECYSTECTOMY  2008  . ERCP W/ SPHICTEROTOMY  02/2010  . ESOPHAGOGASTRODUODENOSCOPY N/A 11/14/2013   Procedure: ESOPHAGOGASTRODUODENOSCOPY (EGD);  Surgeon: Missy Sabins, MD;  Location: [facility] ENDOSCOPY;  Service: Endoscopy;  Laterality: N/A;  . FLEXIBLE SIGMOIDOSCOPY N/A 01/24/2013   Procedure: FLEXIBLE SIGMOIDOSCOPY;  Surgeon: Missy Sabins, MD;  Location: [facility];  Service: Endoscopy;  Laterality: N/A;  . PAIN PUMP IMPLANTATION  ~ 2010   back, dilaudid and bupravacaine  . SPINAL CORD STIMULATOR IMPLANT  ~ 2009  . SPINE SURGERY  1970's,1995,2007    Family History  Problem Relation Age of Onset  . Cancer Mother        uterine  . Heart disease Father   . Hypertension Other   . Osteoporosis Neg Hx     Allergies  Allergen Reactions  . Cefdinir Diarrhea  . Codeine Diarrhea and Nausea And Vomiting    REACTION: n/v/d, HA  . Morphine Diarrhea and Nausea And Vomiting    REACTION: n/v/d, HA  . Sulfa Antibiotics Nausea Only    Sick    . Zoledronic Acid Swelling    REACTION: Severe edema    Current Outpatient Medications on File Prior to Visit  Medication Sig Dispense Refill  . acetaminophen (TYLENOL) 500 MG tablet Take 500 mg by mouth 2 (two) times daily as needed for headache.    . albuterol (PROVENTIL HFA;VENTOLIN HFA) 108 (90 Base) MCG/ACT inhaler Inhale 1-2 puffs into the lungs every 6 (six) hours as needed (wheezing & shortness of breath). Reported on 02/28/2015 1 Inhaler 5  . Ascorbic Acid (VITAMIN C) 500 MG tablet Take 500 mg by mouth daily.      [name] azithromycin (ZITHROMAX Z-PAK) 250 MG tablet 2 tabs by mouth today, then one tab once daily for 4 more days. 6 tablet 0  . budesonide-formoterol (SYMBICORT) 160-4.5 MCG/ACT inhaler Inhale 2 puffs 2 (two) times daily  into the lungs. 2 Inhaler 0  . butalbital-acetaminophen-caffeine (FIORICET, ESGIC) 50-325-40 MG tablet Take 1 tablet by mouth every 4 (four) hours as needed for headache.     . Calcium Carbonate-Vitamin D (CALCIUM 600-D) 600-400 MG-UNIT per tablet Take  1 tablet by mouth 2 (two) times daily with a meal.      . cefdinir (OMNICEF) 300 MG capsule Take 1 capsule (300 mg total) by mouth 2 (two) times daily. 20 capsule 0  . cholecalciferol (VITAMIN D) 1000 units tablet Take 1,000 Units by mouth daily.    . diclofenac sodium (VOLTAREN) 1 % GEL Place 1 application onto the skin 2 (two) times daily as needed.    . gabapentin (NEURONTIN) 100 MG capsule Take 1 capsule (100 mg total) by mouth 2 (two) times daily. 60 capsule 3  . hyoscyamine (LEVSIN SL) 0.125 MG SL tablet Take 0.125 mg by mouth as needed. Abdominal pain    . lactulose (CHRONULAC) 10 GM/15ML solution TAKE 2 TEASPOONSFUL BY MOUTH AT BEDTIME AS NEEDED FOR CONSTIPATION 236 mL 0  . Multiple Vitamins-Minerals (MULTIVITAMIN WITH MINERALS) tablet Take 1 tablet by mouth daily. 30 tablet   . nitroGLYCERIN (NITROSTAT) 0.4 MG SL tablet Place 1 tablet (0.4 mg total) under the tongue every 5 (five) minutes as needed for chest pain. 100 tablet 3  . omeprazole (PRILOSEC) 40 MG capsule Take 1 capsule (40 mg total) by mouth 2 (two) times daily. 60 capsule 3  . oxyCODONE-acetaminophen (PERCOCET) 7.5-325 MG tablet Take 1 tablet by mouth 2 (two) times daily as needed for severe pain.    . Probiotic Product (PROBIOTIC DAILY PO) Take 1 tablet by mouth daily at 12 noon.    . promethazine (PHENERGAN) 12.5 MG tablet Take 1 tablet (12.5 mg total) by mouth every 8 (eight) hours as needed for nausea or vomiting. 20 tablet 0  . VITAMIN E PO Take 1 tablet by mouth daily.     No current facility-administered medications on file prior to visit.     BP (!) 146/65 (BP Location: Left Arm, Cuff Size: Normal)   Pulse 87   Temp 98 F (36.7 C) (Oral)   Resp 16   Ht 5' (1.524 m)    Wt 99 lb (44.9 kg)   SpO2 94%   BMI 19.33 kg/m       Objective:   Physical Exam  Constitutional: She is oriented to person, place, and time. She appears well-developed and well-nourished.  Eyes: EOM are normal.  Cardiovascular: Normal rate, regular rhythm and normal heart sounds.  No murmur heard. Pulses:      Carotid pulses are 2+ on the right side, and 2+ on the left side. No carotid bruits noted.   Pulmonary/Chest: Effort normal and breath sounds normal. No respiratory distress. She has no wheezes.  Neurological: She is alert and oriented to person, place, and time. She exhibits normal muscle tone.  Bony protrusion noted right lateral lumbar spine  Psychiatric: She has a normal mood and affect. Her behavior is normal. Judgment and thought content normal.          Assessment & Plan:   Low back pain- this is chronic.  Bony process is reviewed on x ray and correspond to Severe rotary and dextroconvex scoliosis of the lumbar spine centered at L2. She sees pain clinic and had a pain pump.  Daughter requested rx for a compounded topical  Benazcaine, lidocaine, tetracaine which they are applying sparingly to the area of pain overlying lumbar spine. I gave hand written rx for this.   Osteoporosis- prolia injection today.   Headaches- ESR WNL.  Refer to neurology.  TIA- history consistent with intermittent TIA symptoms.  EKG tracing is personally reviewed.  EKG notes NSR.  No acute  changes. Recent CT head negative for stroke. Advised pt to add aspirin '81mg'$  once daily. Will check 2d echo and carotid duplex. Refer to neurology.   Weight loss- concerning given her age. Will have pt add ensure one can bid.   Lab Results  Component Value Date   TSH 0.92 04/27/2017   TSH WNL.   Weight was 107 09/12/16 Wt Readings from Last 3 Encounters:  05/22/17 99 lb (44.9 kg)  04/27/17 103 lb (46.7 kg)  12/05/16 103 lb 9.6 oz (47 kg)  I would love to have her complete a screening CT  chest/abdomen/pelvis but I am unable to find a diagnosis code that medicare will accept in the absence of acute pulmonary or abdominal complaints. She does not have a 30 yr pack hx to get a screening CT chest.

## 2017-05-22 NOTE — Patient Instructions (Signed)
You will be contacted about scheduling your carotid ultrasound, echocardiogram and neurology referral. Please add aspirin 81mg  once daily.

## 2017-05-23 LAB — COMPREHENSIVE METABOLIC PANEL
AG Ratio: 1.8 (calc) (ref 1.0–2.5)
ALT: 10 U/L (ref 6–29)
AST: 18 U/L (ref 10–35)
Albumin: 3.9 g/dL (ref 3.6–5.1)
Alkaline phosphatase (APISO): 69 U/L (ref 33–130)
BUN/Creatinine Ratio: 27 (calc) — ABNORMAL HIGH (ref 6–22)
BUN: 25 mg/dL (ref 7–25)
CO2: 30 mmol/L (ref 20–32)
Calcium: 8.5 mg/dL — ABNORMAL LOW (ref 8.6–10.4)
Chloride: 105 mmol/L (ref 98–110)
Creat: 0.94 mg/dL — ABNORMAL HIGH (ref 0.60–0.88)
Globulin: 2.2 g/dL (calc) (ref 1.9–3.7)
Glucose, Bld: 81 mg/dL (ref 65–99)
Potassium: 4.3 mmol/L (ref 3.5–5.3)
Sodium: 142 mmol/L (ref 135–146)
Total Bilirubin: 0.2 mg/dL (ref 0.2–1.2)
Total Protein: 6.1 g/dL (ref 6.1–8.1)

## 2017-05-23 LAB — SEDIMENTATION RATE: Sed Rate: 25 mm/h (ref 0–30)

## 2017-05-24 ENCOUNTER — Encounter: Payer: Self-pay | Admitting: Family

## 2017-05-25 NOTE — Addendum Note (Signed)
Addended by: Debbrah Alar on: 05/25/2017 10:00 AM   Modules accepted: Orders

## 2017-05-27 ENCOUNTER — Ambulatory Visit (HOSPITAL_BASED_OUTPATIENT_CLINIC_OR_DEPARTMENT_OTHER)
Admission: RE | Admit: 2017-05-27 | Discharge: 2017-05-27 | Disposition: A | Discharge status: Home / Self Care | Payer: Medicare Other | Source: Ambulatory Visit | Attending: Family | Admitting: Family

## 2017-05-27 ENCOUNTER — Ambulatory Visit: Payer: Medicare Other

## 2017-05-27 DIAGNOSIS — I700 Atherosclerosis of aorta: Secondary | ICD-10-CM | POA: Insufficient documentation

## 2017-05-27 DIAGNOSIS — R634 Abnormal weight loss: Secondary | ICD-10-CM

## 2017-05-27 DIAGNOSIS — R918 Other nonspecific abnormal finding of lung field: Secondary | ICD-10-CM | POA: Insufficient documentation

## 2017-05-27 DIAGNOSIS — K5730 Diverticulosis of large intestine without perforation or abscess without bleeding: Secondary | ICD-10-CM | POA: Diagnosis not present

## 2017-05-27 DIAGNOSIS — I6523 Occlusion and stenosis of bilateral carotid arteries: Secondary | ICD-10-CM | POA: Insufficient documentation

## 2017-05-27 DIAGNOSIS — G459 Transient cerebral ischemic attack, unspecified: Secondary | ICD-10-CM | POA: Diagnosis present

## 2017-05-27 DIAGNOSIS — R1013 Epigastric pain: Secondary | ICD-10-CM

## 2017-05-27 MED ORDER — IOPAMIDOL (ISOVUE-300) INJECTION 61%
100.0000 mL | Freq: Once | INTRAVENOUS | Status: AC | PRN
  Administered 2017-05-27: 100 mL via INTRAVENOUS

## 2017-05-31 ENCOUNTER — Encounter: Payer: Self-pay | Admitting: Family

## 2017-06-01 ENCOUNTER — Encounter: Payer: Self-pay | Admitting: Family

## 2017-06-02 DIAGNOSIS — R51 Headache: Secondary | ICD-10-CM | POA: Diagnosis not present

## 2017-06-02 DIAGNOSIS — G894 Chronic pain syndrome: Secondary | ICD-10-CM | POA: Diagnosis not present

## 2017-06-02 DIAGNOSIS — M545 Low back pain: Secondary | ICD-10-CM | POA: Diagnosis not present

## 2017-06-02 DIAGNOSIS — M5136 Other intervertebral disc degeneration, lumbar region: Secondary | ICD-10-CM | POA: Diagnosis not present

## 2017-06-02 DIAGNOSIS — Z9689 Presence of other specified functional implants: Secondary | ICD-10-CM | POA: Diagnosis not present

## 2017-06-15 ENCOUNTER — Ambulatory Visit (HOSPITAL_BASED_OUTPATIENT_CLINIC_OR_DEPARTMENT_OTHER)
Admission: RE | Admit: 2017-06-15 | Discharge: 2017-06-15 | Disposition: A | Discharge status: Home / Self Care | Payer: Medicare Other | Source: Ambulatory Visit | Attending: Family | Admitting: Family

## 2017-06-15 DIAGNOSIS — G459 Transient cerebral ischemic attack, unspecified: Secondary | ICD-10-CM | POA: Diagnosis not present

## 2017-06-15 DIAGNOSIS — J449 Chronic obstructive pulmonary disease, unspecified: Secondary | ICD-10-CM | POA: Diagnosis not present

## 2017-06-15 DIAGNOSIS — I340 Nonrheumatic mitral (valve) insufficiency: Secondary | ICD-10-CM | POA: Insufficient documentation

## 2017-06-15 DIAGNOSIS — I509 Heart failure, unspecified: Secondary | ICD-10-CM | POA: Diagnosis not present

## 2017-06-15 DIAGNOSIS — I110 Hypertensive heart disease with heart failure: Secondary | ICD-10-CM | POA: Insufficient documentation

## 2017-06-15 NOTE — Progress Notes (Signed)
  Echocardiogram 2D Echocardiogram has been performed.  Lamees Gable T Samah Lapiana 06/15/2017, 12:13 PM

## 2017-07-01 ENCOUNTER — Other Ambulatory Visit: Payer: Self-pay | Admitting: Family

## 2017-07-06 ENCOUNTER — Ambulatory Visit: Payer: Medicare Other | Admitting: *Deleted

## 2017-07-21 DIAGNOSIS — Z5181 Encounter for therapeutic drug level monitoring: Secondary | ICD-10-CM | POA: Diagnosis not present

## 2017-07-21 DIAGNOSIS — M419 Scoliosis, unspecified: Secondary | ICD-10-CM | POA: Diagnosis not present

## 2017-07-21 DIAGNOSIS — M545 Low back pain: Secondary | ICD-10-CM | POA: Diagnosis not present

## 2017-07-21 DIAGNOSIS — G894 Chronic pain syndrome: Secondary | ICD-10-CM | POA: Diagnosis not present

## 2017-07-21 DIAGNOSIS — R51 Headache: Secondary | ICD-10-CM | POA: Diagnosis not present

## 2017-07-21 DIAGNOSIS — Z79899 Other long term (current) drug therapy: Secondary | ICD-10-CM | POA: Diagnosis not present

## 2017-08-31 DIAGNOSIS — R1013 Epigastric pain: Secondary | ICD-10-CM | POA: Diagnosis not present

## 2017-08-31 DIAGNOSIS — K5901 Slow transit constipation: Secondary | ICD-10-CM | POA: Diagnosis not present

## 2017-09-08 DIAGNOSIS — Z9689 Presence of other specified functional implants: Secondary | ICD-10-CM | POA: Diagnosis not present

## 2017-09-08 DIAGNOSIS — R51 Headache: Secondary | ICD-10-CM | POA: Diagnosis not present

## 2017-09-08 DIAGNOSIS — M545 Low back pain: Secondary | ICD-10-CM | POA: Diagnosis not present

## 2017-09-08 DIAGNOSIS — G894 Chronic pain syndrome: Secondary | ICD-10-CM | POA: Diagnosis not present

## 2017-10-01 ENCOUNTER — Other Ambulatory Visit: Payer: Self-pay | Admitting: Family

## 2017-10-14 DIAGNOSIS — H353131 Nonexudative age-related macular degeneration, bilateral, early dry stage: Secondary | ICD-10-CM | POA: Diagnosis not present

## 2017-10-22 ENCOUNTER — Telehealth: Payer: Self-pay

## 2017-10-22 NOTE — Telephone Encounter (Signed)
Prolia benefits received PA is not required     Patient may owe approximately $0.00 OOP  Patient due after 11/18/17  Letter mailed to inform patient of benefits and to schedule

## 2017-10-29 DIAGNOSIS — Z978 Presence of other specified devices: Secondary | ICD-10-CM | POA: Diagnosis not present

## 2017-10-29 DIAGNOSIS — M5136 Other intervertebral disc degeneration, lumbar region: Secondary | ICD-10-CM | POA: Diagnosis not present

## 2017-10-29 DIAGNOSIS — M25561 Pain in right knee: Secondary | ICD-10-CM | POA: Diagnosis not present

## 2017-10-29 DIAGNOSIS — R51 Headache: Secondary | ICD-10-CM | POA: Diagnosis not present

## 2017-10-29 DIAGNOSIS — M545 Low back pain: Secondary | ICD-10-CM | POA: Diagnosis not present

## 2017-10-29 DIAGNOSIS — G894 Chronic pain syndrome: Secondary | ICD-10-CM | POA: Diagnosis not present

## 2017-10-30 NOTE — Telephone Encounter (Signed)
Author phoned pt. To schedule prolia injection. No answer. Author left detailed VM asking pt. To return call to schedule prolia, due on or after 11/18/17. Routed to Vernie Shanks, LPN, to follow-up.

## 2017-11-02 NOTE — Telephone Encounter (Signed)
According to schedule patient has appointment scheduled for Prolia injection on 11/10/17. Called patient to verify but no answer.

## 2017-11-02 NOTE — Telephone Encounter (Signed)
11/10/17 is too soon to receive prolia injection based on last injection date of 05/22/17. Appointment says "nurse" but also listed as "lab". Author phoned pt. To reschedule, but no answer.

## 2017-11-05 NOTE — Telephone Encounter (Signed)
Patients Prolia injection appointment rescheduled to 12/02/17.

## 2017-11-10 ENCOUNTER — Ambulatory Visit: Payer: Medicare Other

## 2017-11-12 DIAGNOSIS — G894 Chronic pain syndrome: Secondary | ICD-10-CM | POA: Diagnosis not present

## 2017-11-12 DIAGNOSIS — M545 Low back pain: Secondary | ICD-10-CM | POA: Diagnosis not present

## 2017-11-12 DIAGNOSIS — Z978 Presence of other specified devices: Secondary | ICD-10-CM | POA: Diagnosis not present

## 2017-11-12 DIAGNOSIS — M419 Scoliosis, unspecified: Secondary | ICD-10-CM | POA: Diagnosis not present

## 2017-11-17 ENCOUNTER — Ambulatory Visit (INDEPENDENT_AMBULATORY_CARE_PROVIDER_SITE_OTHER): Payer: Medicare Other

## 2017-11-17 DIAGNOSIS — Z23 Encounter for immunization: Secondary | ICD-10-CM

## 2017-11-18 ENCOUNTER — Encounter: Payer: Self-pay | Admitting: Family

## 2017-12-02 ENCOUNTER — Ambulatory Visit: Payer: Medicare Other

## 2017-12-02 DIAGNOSIS — M818 Other osteoporosis without current pathological fracture: Secondary | ICD-10-CM

## 2017-12-02 MED ORDER — DENOSUMAB 60 MG/ML ~~LOC~~ SOSY: 60.0000 mg | PREFILLED_SYRINGE | Freq: Once | SUBCUTANEOUS | Status: DC

## 2017-12-02 NOTE — Progress Notes (Signed)
Pre visit review using our clinic tool,if applicable. No additional management support is needed unless otherwise documented below in the visit note.   Patient in for Prolia injection per order from Debbrah Alar NP.  Given SQ left arm. Patient tolerated well. No complaints voiced this visit.  Reminder card given for next injection.

## 2017-12-07 ENCOUNTER — Other Ambulatory Visit: Payer: Self-pay | Admitting: Family

## 2017-12-07 ENCOUNTER — Telehealth: Payer: Self-pay | Admitting: Family

## 2017-12-07 NOTE — Telephone Encounter (Signed)
Rx has already been pended for provider review

## 2017-12-07 NOTE — Telephone Encounter (Signed)
Copied from [place] [id number]. Topic: Quick Communication - Rx Refill/Question >> Dec 07, 2017 11:42 AM Reyne Dumas L wrote: Medication:  lactulose ([place]) 10 GM/15ML solution  Has the patient contacted their pharmacy? No - but she will be calling in.  Pt ran completely out yesterday (Agent: If no, request that the patient contact the pharmacy for the refill.) (Agent: If yes, when and what did the pharmacy advise?)  Preferred Pharmacy (with phone number or street name): [facility], [place] - [address] [phone] (Phone) [phone] (Fax)  Agent: Please be advised that RX refills may take up to 3 business days. We ask that you follow-up with your pharmacy.

## 2017-12-15 DIAGNOSIS — Z978 Presence of other specified devices: Secondary | ICD-10-CM | POA: Diagnosis not present

## 2017-12-15 DIAGNOSIS — R51 Headache: Secondary | ICD-10-CM | POA: Diagnosis not present

## 2017-12-15 DIAGNOSIS — G894 Chronic pain syndrome: Secondary | ICD-10-CM | POA: Diagnosis not present

## 2017-12-15 DIAGNOSIS — G8929 Other chronic pain: Secondary | ICD-10-CM | POA: Diagnosis not present

## 2017-12-15 DIAGNOSIS — M545 Low back pain: Secondary | ICD-10-CM | POA: Diagnosis not present

## 2017-12-15 DIAGNOSIS — M419 Scoliosis, unspecified: Secondary | ICD-10-CM | POA: Diagnosis not present

## 2017-12-29 DIAGNOSIS — M545 Low back pain: Secondary | ICD-10-CM | POA: Diagnosis not present

## 2017-12-29 DIAGNOSIS — R51 Headache: Secondary | ICD-10-CM | POA: Diagnosis not present

## 2017-12-29 DIAGNOSIS — M419 Scoliosis, unspecified: Secondary | ICD-10-CM | POA: Diagnosis not present

## 2017-12-29 DIAGNOSIS — G894 Chronic pain syndrome: Secondary | ICD-10-CM | POA: Diagnosis not present

## 2017-12-29 DIAGNOSIS — Z978 Presence of other specified devices: Secondary | ICD-10-CM | POA: Diagnosis not present

## 2017-12-31 DIAGNOSIS — L814 Other melanin hyperpigmentation: Secondary | ICD-10-CM | POA: Diagnosis not present

## 2017-12-31 DIAGNOSIS — D0439 Carcinoma in situ of skin of other parts of face: Secondary | ICD-10-CM | POA: Diagnosis not present

## 2017-12-31 DIAGNOSIS — L570 Actinic keratosis: Secondary | ICD-10-CM | POA: Diagnosis not present

## 2017-12-31 DIAGNOSIS — Z23 Encounter for immunization: Secondary | ICD-10-CM | POA: Diagnosis not present

## 2017-12-31 DIAGNOSIS — D485 Neoplasm of uncertain behavior of skin: Secondary | ICD-10-CM | POA: Diagnosis not present

## 2017-12-31 DIAGNOSIS — L821 Other seborrheic keratosis: Secondary | ICD-10-CM | POA: Diagnosis not present

## 2018-01-12 DIAGNOSIS — M545 Low back pain: Secondary | ICD-10-CM | POA: Diagnosis not present

## 2018-01-12 DIAGNOSIS — Z978 Presence of other specified devices: Secondary | ICD-10-CM | POA: Diagnosis not present

## 2018-01-12 DIAGNOSIS — G894 Chronic pain syndrome: Secondary | ICD-10-CM | POA: Diagnosis not present

## 2018-01-12 DIAGNOSIS — R51 Headache: Secondary | ICD-10-CM | POA: Diagnosis not present

## 2018-01-26 ENCOUNTER — Other Ambulatory Visit: Payer: Self-pay | Admitting: Family

## 2018-02-01 DIAGNOSIS — D485 Neoplasm of uncertain behavior of skin: Secondary | ICD-10-CM | POA: Diagnosis not present

## 2018-02-01 DIAGNOSIS — C44329 Squamous cell carcinoma of skin of other parts of face: Secondary | ICD-10-CM | POA: Diagnosis not present

## 2018-02-03 ENCOUNTER — Encounter (HOSPITAL_COMMUNITY): Payer: Self-pay | Admitting: Radiology

## 2018-02-03 ENCOUNTER — Emergency Department (HOSPITAL_COMMUNITY): Payer: Medicare Other

## 2018-02-03 ENCOUNTER — Other Ambulatory Visit: Payer: Self-pay

## 2018-02-03 ENCOUNTER — Ambulatory Visit: Payer: Self-pay

## 2018-02-03 ENCOUNTER — Inpatient Hospital Stay (HOSPITAL_COMMUNITY)
Admission: EM | Admit: 2018-02-03 | Discharge: 2018-02-06 | DRG: 871 | Disposition: A | Discharge status: Home Health | Payer: Medicare Other | Attending: Internal Medicine | Admitting: Internal Medicine

## 2018-02-03 DIAGNOSIS — Z79899 Other long term (current) drug therapy: Secondary | ICD-10-CM

## 2018-02-03 DIAGNOSIS — E43 Unspecified severe protein-calorie malnutrition: Secondary | ICD-10-CM | POA: Diagnosis present

## 2018-02-03 DIAGNOSIS — R6521 Severe sepsis with septic shock: Secondary | ICD-10-CM | POA: Diagnosis not present

## 2018-02-03 DIAGNOSIS — I10 Essential (primary) hypertension: Secondary | ICD-10-CM

## 2018-02-03 DIAGNOSIS — J690 Pneumonitis due to inhalation of food and vomit: Secondary | ICD-10-CM | POA: Diagnosis present

## 2018-02-03 DIAGNOSIS — J9601 Acute respiratory failure with hypoxia: Secondary | ICD-10-CM

## 2018-02-03 DIAGNOSIS — R1084 Generalized abdominal pain: Secondary | ICD-10-CM

## 2018-02-03 DIAGNOSIS — R0600 Dyspnea, unspecified: Secondary | ICD-10-CM | POA: Diagnosis not present

## 2018-02-03 DIAGNOSIS — Z791 Long term (current) use of non-steroidal anti-inflammatories (NSAID): Secondary | ICD-10-CM

## 2018-02-03 DIAGNOSIS — Z9049 Acquired absence of other specified parts of digestive tract: Secondary | ICD-10-CM

## 2018-02-03 DIAGNOSIS — Z7951 Long term (current) use of inhaled steroids: Secondary | ICD-10-CM

## 2018-02-03 DIAGNOSIS — Z681 Body mass index (BMI) 19 or less, adult: Secondary | ICD-10-CM

## 2018-02-03 DIAGNOSIS — R5381 Other malaise: Secondary | ICD-10-CM | POA: Diagnosis present

## 2018-02-03 DIAGNOSIS — Z87891 Personal history of nicotine dependence: Secondary | ICD-10-CM | POA: Diagnosis not present

## 2018-02-03 DIAGNOSIS — N182 Chronic kidney disease, stage 2 (mild): Secondary | ICD-10-CM | POA: Diagnosis not present

## 2018-02-03 DIAGNOSIS — I130 Hypertensive heart and chronic kidney disease with heart failure and stage 1 through stage 4 chronic kidney disease, or unspecified chronic kidney disease: Secondary | ICD-10-CM | POA: Diagnosis present

## 2018-02-03 DIAGNOSIS — K529 Noninfective gastroenteritis and colitis, unspecified: Secondary | ICD-10-CM | POA: Diagnosis present

## 2018-02-03 DIAGNOSIS — Z9981 Dependence on supplemental oxygen: Secondary | ICD-10-CM | POA: Diagnosis not present

## 2018-02-03 DIAGNOSIS — E860 Dehydration: Secondary | ICD-10-CM | POA: Diagnosis present

## 2018-02-03 DIAGNOSIS — R52 Pain, unspecified: Secondary | ICD-10-CM | POA: Diagnosis not present

## 2018-02-03 DIAGNOSIS — J449 Chronic obstructive pulmonary disease, unspecified: Secondary | ICD-10-CM | POA: Diagnosis present

## 2018-02-03 DIAGNOSIS — Z85828 Personal history of other malignant neoplasm of skin: Secondary | ICD-10-CM

## 2018-02-03 DIAGNOSIS — K5900 Constipation, unspecified: Secondary | ICD-10-CM | POA: Diagnosis present

## 2018-02-03 DIAGNOSIS — R112 Nausea with vomiting, unspecified: Secondary | ICD-10-CM | POA: Diagnosis present

## 2018-02-03 DIAGNOSIS — E46 Unspecified protein-calorie malnutrition: Secondary | ICD-10-CM

## 2018-02-03 DIAGNOSIS — I5032 Chronic diastolic (congestive) heart failure: Secondary | ICD-10-CM | POA: Diagnosis present

## 2018-02-03 DIAGNOSIS — K219 Gastro-esophageal reflux disease without esophagitis: Secondary | ICD-10-CM | POA: Diagnosis present

## 2018-02-03 DIAGNOSIS — R188 Other ascites: Secondary | ICD-10-CM | POA: Diagnosis not present

## 2018-02-03 DIAGNOSIS — G894 Chronic pain syndrome: Secondary | ICD-10-CM | POA: Diagnosis present

## 2018-02-03 DIAGNOSIS — R109 Unspecified abdominal pain: Secondary | ICD-10-CM | POA: Diagnosis present

## 2018-02-03 DIAGNOSIS — Z90710 Acquired absence of both cervix and uterus: Secondary | ICD-10-CM | POA: Diagnosis not present

## 2018-02-03 DIAGNOSIS — A419 Sepsis, unspecified organism: Secondary | ICD-10-CM | POA: Diagnosis not present

## 2018-02-03 DIAGNOSIS — M810 Age-related osteoporosis without current pathological fracture: Secondary | ICD-10-CM | POA: Diagnosis present

## 2018-02-03 DIAGNOSIS — E785 Hyperlipidemia, unspecified: Secondary | ICD-10-CM | POA: Diagnosis present

## 2018-02-03 DIAGNOSIS — R000 Tachycardia, unspecified: Secondary | ICD-10-CM | POA: Diagnosis not present

## 2018-02-03 DIAGNOSIS — R0902 Hypoxemia: Secondary | ICD-10-CM | POA: Diagnosis not present

## 2018-02-03 DIAGNOSIS — J9621 Acute and chronic respiratory failure with hypoxia: Secondary | ICD-10-CM | POA: Diagnosis present

## 2018-02-03 LAB — CBC WITH DIFFERENTIAL/PLATELET
Abs Immature Granulocytes: 0.04 10*3/uL (ref 0.00–0.07)
Basophils Absolute: 0 10*3/uL (ref 0.0–0.1)
Basophils Relative: 0 %
Eosinophils Absolute: 0.2 10*3/uL (ref 0.0–0.5)
Eosinophils Relative: 2 %
HCT: 56.1 % — ABNORMAL HIGH (ref 36.0–46.0)
Hemoglobin: 17.4 g/dL — ABNORMAL HIGH (ref 12.0–15.0)
Immature Granulocytes: 0 %
Lymphocytes Relative: 6 %
Lymphs Abs: 0.5 10*3/uL — ABNORMAL LOW (ref 0.7–4.0)
MCH: 29.5 pg (ref 26.0–34.0)
MCHC: 31 g/dL (ref 30.0–36.0)
MCV: 95.1 fL (ref 80.0–100.0)
Monocytes Absolute: 0.5 10*3/uL (ref 0.1–1.0)
Monocytes Relative: 5 %
Neutro Abs: 7.8 10*3/uL — ABNORMAL HIGH (ref 1.7–7.7)
Neutrophils Relative %: 87 %
Platelets: 223 10*3/uL (ref 150–400)
RBC: 5.9 MIL/uL — ABNORMAL HIGH (ref 3.87–5.11)
RDW: 14.6 % (ref 11.5–15.5)
WBC Morphology: INCREASED
WBC: 8.9 10*3/uL (ref 4.0–10.5)
nRBC: 0 % (ref 0.0–0.2)

## 2018-02-03 LAB — COMPREHENSIVE METABOLIC PANEL
ALT: 16 U/L (ref 0–44)
AST: 27 U/L (ref 15–41)
Albumin: 3.7 g/dL (ref 3.5–5.0)
Alkaline Phosphatase: 92 U/L (ref 38–126)
Anion gap: 16 — ABNORMAL HIGH (ref 5–15)
BUN: 23 mg/dL (ref 8–23)
CO2: 26 mmol/L (ref 22–32)
Calcium: 9.1 mg/dL (ref 8.9–10.3)
Chloride: 96 mmol/L — ABNORMAL LOW (ref 98–111)
Creatinine, Ser: 1.22 mg/dL — ABNORMAL HIGH (ref 0.44–1.00)
GFR calc Af Amer: 45 mL/min — ABNORMAL LOW (ref >60)
GFR calc non Af Amer: 39 mL/min — ABNORMAL LOW (ref >60)
Glucose, Bld: 179 mg/dL — ABNORMAL HIGH (ref 70–99)
Potassium: 4.8 mmol/L (ref 3.5–5.1)
Sodium: 138 mmol/L (ref 135–145)
Total Bilirubin: 0.6 mg/dL (ref 0.3–1.2)
Total Protein: 7.2 g/dL (ref 6.5–8.1)

## 2018-02-03 LAB — I-STAT CG4 LACTIC ACID, ED
Lactic Acid, Venous: 3.46 mmol/L (ref 0.5–1.9)
Lactic Acid, Venous: 2.25 mmol/L (ref 0.5–1.9)

## 2018-02-03 LAB — BRAIN NATRIURETIC PEPTIDE: B Natriuretic Peptide: 199.2 pg/mL — ABNORMAL HIGH (ref 0.0–100.0)

## 2018-02-03 LAB — I-STAT ARTERIAL BLOOD GAS, ED
Acid-Base Excess: 2 mmol/L (ref 0.0–2.0)
Bicarbonate: 29.1 mmol/L — ABNORMAL HIGH (ref 20.0–28.0)
O2 Saturation: 99 %
Patient temperature: 98
TCO2: 31 mmol/L (ref 22–32)
pCO2 arterial: 51.7 mmHg — ABNORMAL HIGH (ref 32.0–48.0)
pH, Arterial: 7.357 (ref 7.350–7.450)
pO2, Arterial: 163 mmHg — ABNORMAL HIGH (ref 83.0–108.0)

## 2018-02-03 LAB — I-STAT TROPONIN, ED: Troponin i, poc: 0.04 ng/mL (ref 0.00–0.08)

## 2018-02-03 LAB — LIPASE, BLOOD: Lipase: 25 U/L (ref 11–51)

## 2018-02-03 MED ORDER — PROMETHAZINE HCL 25 MG RE SUPP: 25.0000 mg | Freq: Four times a day (QID) | RECTAL | 0 refills | Status: DC | PRN

## 2018-02-03 MED ORDER — SODIUM CHLORIDE 0.9 % IV SOLN
1.0000 g | INTRAVENOUS | Status: DC
  Administered 2018-02-04: 1 g via INTRAVENOUS
  Filled 2018-02-03: qty 1

## 2018-02-03 MED ORDER — SODIUM CHLORIDE 0.9 % IV SOLN
2.0000 g | Freq: Once | INTRAVENOUS | Status: AC
  Administered 2018-02-03: 2 g via INTRAVENOUS
  Filled 2018-02-03: qty 2

## 2018-02-03 MED ORDER — ACETAMINOPHEN 325 MG PO TABS: 650.0000 mg | ORAL_TABLET | Freq: Four times a day (QID) | ORAL | Status: DC | PRN

## 2018-02-03 MED ORDER — IOHEXOL 300 MG/ML  SOLN
100.0000 mL | Freq: Once | INTRAMUSCULAR | Status: AC | PRN
  Administered 2018-02-03: 100 mL via INTRAVENOUS

## 2018-02-03 MED ORDER — IPRATROPIUM-ALBUTEROL 0.5-2.5 (3) MG/3ML IN SOLN
3.0000 mL | RESPIRATORY_TRACT | Status: DC
  Administered 2018-02-04 (×2): 3 mL via RESPIRATORY_TRACT
  Filled 2018-02-03 (×2): qty 3

## 2018-02-03 MED ORDER — ACETAMINOPHEN 325 MG PO TABS
650.0000 mg | ORAL_TABLET | Freq: Once | ORAL | Status: AC
  Administered 2018-02-03: 650 mg via ORAL
  Filled 2018-02-03: qty 2

## 2018-02-03 MED ORDER — VANCOMYCIN HCL IN DEXTROSE 1-5 GM/200ML-% IV SOLN
1000.0000 mg | Freq: Once | INTRAVENOUS | Status: AC
  Administered 2018-02-03: 1000 mg via INTRAVENOUS
  Filled 2018-02-03: qty 200

## 2018-02-03 MED ORDER — HYDRALAZINE HCL 20 MG/ML IJ SOLN: 5.0000 mg | INTRAMUSCULAR | Status: DC | PRN

## 2018-02-03 MED ORDER — SODIUM CHLORIDE 0.9 % IV BOLUS
1000.0000 mL | Freq: Once | INTRAVENOUS | Status: AC
  Administered 2018-02-04: 500 mL via INTRAVENOUS

## 2018-02-03 MED ORDER — SODIUM CHLORIDE 0.9 % IV SOLN
INTRAVENOUS | Status: AC
  Administered 2018-02-04 (×2): via INTRAVENOUS

## 2018-02-03 MED ORDER — ZOLPIDEM TARTRATE 5 MG PO TABS
5.0000 mg | ORAL_TABLET | Freq: Every evening | ORAL | Status: DC | PRN
  Administered 2018-02-04 – 2018-02-05 (×2): 5 mg via ORAL
  Filled 2018-02-03 (×2): qty 1

## 2018-02-03 MED ORDER — LEVALBUTEROL HCL 1.25 MG/0.5ML IN NEBU
1.2500 mg | INHALATION_SOLUTION | Freq: Four times a day (QID) | RESPIRATORY_TRACT | Status: DC
  Administered 2018-02-04 (×2): 1.25 mg via RESPIRATORY_TRACT
  Filled 2018-02-03 (×2): qty 0.5

## 2018-02-03 MED ORDER — SODIUM CHLORIDE 0.9 % IV BOLUS
1000.0000 mL | Freq: Once | INTRAVENOUS | Status: AC
  Administered 2018-02-03: 1000 mL via INTRAVENOUS

## 2018-02-03 MED ORDER — SODIUM CHLORIDE 0.9 % IV BOLUS
500.0000 mL | Freq: Once | INTRAVENOUS | Status: AC
  Administered 2018-02-03: 500 mL via INTRAVENOUS

## 2018-02-03 MED ORDER — ENOXAPARIN SODIUM 30 MG/0.3ML ~~LOC~~ SOLN
30.0000 mg | Freq: Every day | SUBCUTANEOUS | Status: DC
  Administered 2018-02-04 – 2018-02-05 (×2): 30 mg via SUBCUTANEOUS
  Filled 2018-02-03 (×2): qty 0.3

## 2018-02-03 MED ORDER — DM-GUAIFENESIN ER 30-600 MG PO TB12
1.0000 | ORAL_TABLET | Freq: Two times a day (BID) | ORAL | Status: DC | PRN
  Filled 2018-02-03: qty 1

## 2018-02-03 MED ORDER — ONDANSETRON HCL 4 MG/2ML IJ SOLN
4.0000 mg | Freq: Once | INTRAMUSCULAR | Status: AC
  Administered 2018-02-03: 4 mg via INTRAVENOUS
  Filled 2018-02-03: qty 2

## 2018-02-03 MED ORDER — SODIUM CHLORIDE 0.9 % IV BOLUS (SEPSIS)
500.0000 mL | Freq: Once | INTRAVENOUS | Status: AC
  Administered 2018-02-03: 500 mL via INTRAVENOUS

## 2018-02-03 MED ORDER — VANCOMYCIN HCL IN DEXTROSE 750-5 MG/150ML-% IV SOLN: 750.0000 mg | INTRAVENOUS | Status: DC

## 2018-02-03 MED ORDER — METRONIDAZOLE IN NACL 5-0.79 MG/ML-% IV SOLN
500.0000 mg | Freq: Three times a day (TID) | INTRAVENOUS | Status: DC
  Administered 2018-02-03 – 2018-02-05 (×5): 500 mg via INTRAVENOUS
  Filled 2018-02-03 (×5): qty 100

## 2018-02-03 MED ORDER — ONDANSETRON HCL 4 MG/2ML IJ SOLN: 4.0000 mg | Freq: Three times a day (TID) | INTRAMUSCULAR | Status: DC | PRN

## 2018-02-03 NOTE — Telephone Encounter (Signed)
Rx sent. Needs to go to the ER if vomiting does not subside after phenergan suppository or if abdominal pain.

## 2018-02-03 NOTE — ED Triage Notes (Addendum)
Per [place], pt coming from home where she lives alone. Pt was visited by family at 0400 who states she was having N/V, abd pain, and gen weakness. Pt is orient x4, but sleeping more than normal. Pt 88% on RA, pt placed on 2L [place]. Pt wears 2L [place] at night as needed.

## 2018-02-03 NOTE — H&P (Signed)
History and Physical    [name] [name] JOI:[id number] DOB: 11/04/1925 DOA: 02/03/2018  Referring MD/NP/PA:   PCP: Debbrah Alar, NP   Patient coming from:  The patient is coming from home.  At baseline, pt is independent for most of ADL.        Chief Complaint: Nausea, vomiting, abdominal pain, shortness of breath  HPI: [name] [name] is a 85 y.o. female with medical history significant of hypertension, COPD on 2 L nasal cannula oxygen at night, GERD, CHF, pancreatitis, pancolitis, UTI, CKD- 2, GERD, who presents with nausea vomiting, abdominal pain and shortness breath.  Per patient's daughter and granddaughter, patient developed nausea vomiting and generalized moderate abdominal pain this afternoon.  She has had nonbilious, nonbloody vomitus at least 8 times.  No diarrhea.  Patient also has fever and chills.  She has shortness of breath, but no cough or chest pain.  Oxygen saturation 88% on room air in ED.  Denies symptoms of UTI or unilateral weakness.  Per patient's granddaughter, patient had similar issues before, patient may have aspirated during the vomiting.  Initially patient had blood pressure 172/89, 112/65, then decreased to 84/54 in the ED.  Granddaughter reports that the patient has an intrathecal analgesic pump for back pain. Pt has skin cancer removed in her left face recently.  ED Course: pt was found to have WBC 8.9, BNP 199, negative troponin, lactic acid 3.46, 2.25, pending urinalysis, worsening renal function, temperature 100.3, tachycardia, tachypnea, oxygen saturation 88% on room air.  Chest x-ray negative.  CT abdomen/pelvis: 1. Fluid-filled stomach and small bowel with mild mucosal enhancement, suggesting gastroenteritis. Descending and sigmoid colon are nondistended, difficult to exclude an element of colonic wall thickening/colitis. 2. Small volume of ascites is likely reactive. 3. Clustered nodular opacities in the lingula, left lower lobe and to a lesser degree  right middle lobe, likely infectious. Distribution is atypical for that seen with aspiration. 4.  Aortic Atherosclerosis (ICD10-I70.0).  Review of Systems:   General: has fevers, chills, no body weight gain, has poor appetite, has fatigue HEENT: no blurry vision, hearing changes or sore throat Respiratory: has dyspnea, no coughing, wheezing CV: no chest pain, no palpitations GI: has nausea, vomiting, abdominal pain, no diarrhea, constipation GU: no dysuria, burning on urination, increased urinary frequency, hematuria  Ext: no leg edema Neuro: no unilateral weakness, numbness, or tingling, no vision change or hearing loss Skin: no rash, s/p of left facial skin cancer removal.  MSK: No muscle spasm, no deformity, no limitation of range of movement in spin Heme: No easy bruising.  Travel history: No recent long distant travel.  Allergy:  Allergies  Allergen Reactions  . Cefdinir Diarrhea  . Codeine Diarrhea and Nausea And Vomiting    REACTION: n/v/d, HA  . Influenza Vaccines     Stomach pain, headache  . Morphine Diarrhea and Nausea And Vomiting    REACTION: n/v/d, HA  . Sulfa Antibiotics Nausea Only    Sick    . Zoledronic Acid Swelling    REACTION: Severe edema    Past Medical History:  Diagnosis Date  . Abdominal pain    Resolved  . Cardiac arrhythmia   . CHF (congestive heart failure) ([place])    Diastolic  on ECHO 4709  . Choledocholithiasis   . Chronic anemia   . Chronic back pain   . COPD (chronic obstructive pulmonary disease) (HCC)    oxygen dependent at  night 2LPM  . Daily headache   . GERD (gastroesophageal reflux disease)   .  HCAP (healthcare-associated pneumonia) 03/28/2014  . Hiatal hernia   . Hyperlipidemia   . Hypertension   . On home oxygen therapy    "1L during the day; 2L q hs" (03/28/2014)  . Osteoarthritis    "pretty much q where"   . Osteoporosis   . Pancolitis (HCC)    Infectious vs. inflammatory  . Pancreatitis    History of  . Pneumonia  1930's; 2011 X 2; 02/2014  . PONV (postoperative nausea and vomiting)   . PUD (peptic ulcer disease)   . Reactive airway disease that is not asthma   . Recurrent UTI (urinary tract infection)    "here lately" (03/28/2014)  . Scoliosis deformity of spine    history of intractable back pain    Past Surgical History:  Procedure Laterality Date  . ABDOMINAL HYSTERECTOMY  1970s  . BACK SURGERY     x 5  . BREAST CYST EXCISION Right 1960's  . CERVICAL LAMINECTOMY  1970s  . CHOLECYSTECTOMY  2008  . ERCP W/ SPHICTEROTOMY  02/2010  . ESOPHAGOGASTRODUODENOSCOPY N/A 11/14/2013   Procedure: ESOPHAGOGASTRODUODENOSCOPY (EGD);  Surgeon: Missy Sabins, MD;  Location: [facility] ENDOSCOPY;  Service: Endoscopy;  Laterality: N/A;  . FLEXIBLE SIGMOIDOSCOPY N/A 01/24/2013   Procedure: FLEXIBLE SIGMOIDOSCOPY;  Surgeon: Missy Sabins, MD;  Location: [place];  Service: Endoscopy;  Laterality: N/A;  . PAIN PUMP IMPLANTATION  ~ 2010   back, dilaudid and bupravacaine  . SPINAL CORD STIMULATOR IMPLANT  ~ 2009  . SPINE SURGERY  [phone]    Social History:  reports that she quit smoking about 30 years ago. Her smoking use included cigarettes. She has a 12.00 pack-year smoking history. She has never used smokeless tobacco. She reports that she does not drink alcohol or use drugs.  Family History:  Family History  Problem Relation Age of Onset  . Cancer Mother        uterine  . Heart disease Father   . Hypertension Other   . Osteoporosis Neg Hx      Prior to Admission medications   Medication Sig Start Date End Date Taking? Authorizing Provider  acetaminophen (TYLENOL) 500 MG tablet Take 500 mg by mouth 2 (two) times daily as needed for headache.    [provider]  albuterol (PROVENTIL HFA;VENTOLIN HFA) 108 (90 Base) MCG/ACT inhaler Inhale 1-2 puffs into the lungs every 6 (six) hours as needed (wheezing & shortness of breath). Reported on 02/28/2015 10/09/15   Debbrah Alar, NP  Ascorbic  Acid (VITAMIN C) 500 MG tablet Take 500 mg by mouth daily.      [provider]  azithromycin (ZITHROMAX Z-PAK) 250 MG tablet 2 tabs by mouth today, then one tab once daily for 4 more days. 05/04/17   Debbrah Alar, NP  budesonide-formoterol (SYMBICORT) 160-4.5 MCG/ACT inhaler Inhale 2 puffs 2 (two) times daily into the lungs. 12/05/16   Parrett, Fonnie Mu, NP  butalbital-acetaminophen-caffeine (FIORICET, ESGIC) 50-325-40 MG tablet Take 1 tablet by mouth every 4 (four) hours as needed for headache.     [provider]  Calcium Carbonate-Vitamin D (CALCIUM 600-D) 600-400 MG-UNIT per tablet Take 1 tablet by mouth 2 (two) times daily with a meal.      [provider]  cefdinir (OMNICEF) 300 MG capsule Take 1 capsule (300 mg total) by mouth 2 (two) times daily. 04/27/17   Debbrah Alar, NP  cholecalciferol (VITAMIN D) 1000 units tablet Take 1,000 Units by mouth daily.    [provider]  diclofenac sodium (VOLTAREN) 1 % GEL Place 1 application onto the skin 2 (two) times daily as needed. 06/06/15   [provider]  gabapentin (NEURONTIN) 100 MG capsule Take 1 capsule (100 mg total) by mouth 2 (two) times daily. 08/23/13   Debbrah Alar, NP  hyoscyamine (LEVSIN SL) 0.125 MG SL tablet Take 0.125 mg by mouth as needed. Abdominal pain 11/24/11   [provider]  lactulose (CHRONULAC) 10 GM/15ML solution TAKE 2 TEASPOONFUL (10ML) BY MOUTH AT BEDTIME AS NEEDED FOR CONSTIPATION 01/29/18   Debbrah Alar, NP  Multiple Vitamins-Minerals (MULTIVITAMIN WITH MINERALS) tablet Take 1 tablet by mouth daily. 07/01/15   Debbrah Alar, NP  nitroGLYCERIN (NITROSTAT) 0.4 MG SL tablet Place 1 tablet (0.4 mg total) under the tongue every 5 (five) minutes as needed for chest pain. 11/11/10   Darlin Coco, MD  omeprazole (PRILOSEC) 40 MG capsule Take 1 capsule (40 mg total) by mouth 2 (two) times daily. 11/16/13   Thurnell Lose, MD    oxyCODONE-acetaminophen (PERCOCET) 7.5-325 MG tablet Take 1 tablet by mouth 2 (two) times daily as needed for severe pain.    [provider]  Probiotic Product (PROBIOTIC DAILY PO) Take 1 tablet by mouth daily at 12 noon.    [provider]  promethazine (PHENERGAN) 25 MG suppository Place 1 suppository (25 mg total) rectally every 6 (six) hours as needed for nausea or vomiting. 02/03/18   Debbrah Alar, NP  VITAMIN E PO Take 1 tablet by mouth daily.    [provider]    Physical Exam: Vitals:   02/03/18 2245 02/03/18 2303 02/03/18 2317 02/03/18 2330  BP: 128/71 112/65 (!) 86/55 (!) 84/54  Pulse: (!) 124 (!) 126 (!) 121 (!) 117  Resp: 18 13 20 16   Temp:      TempSrc:      SpO2: 98% 97% 95% 93%  Weight:      Height:       General: Not in acute distress HEENT:       Eyes: PERRL, EOMI, no scleral icterus.       ENT: No discharge from the ears and nose, no pharynx injection, no tonsillar enlargement.        Neck: No JVD, no bruit, no mass felt. Heme: No neck lymph node enlargement. Cardiac: S1/S2, RRR, No murmurs, No gallops or rubs. Respiratory: No rales, wheezing, rhonchi or rubs. GI: Soft, nondistended, has tenderness in central abdomen, no rebound pain, no organomegaly, BS present. GU: No hematuria Ext: No pitting leg edema bilaterally. 2+DP/PT pulse bilaterally. Musculoskeletal: No joint deformities, No joint redness or warmth, no limitation of ROM in spin. Skin: No rashes.  Neuro: Alert, oriented X3, cranial nerves II-XII grossly intact, moves all extremities normally.  Psych: Patient is not psychotic, no suicidal or hemocidal ideation.  Labs on Admission: I have personally reviewed following labs and imaging studies  CBC: Recent Labs  Lab 02/03/18 2027  WBC 8.9  NEUTROABS 7.8*  HGB 17.4*  HCT 56.1*  MCV 95.1  PLT 347   Basic Metabolic Panel: Recent Labs  Lab 02/03/18 2027  NA 138  K 4.8  CL 96*  CO2 26  GLUCOSE 179*  BUN 23   CREATININE 1.22*  CALCIUM 9.1   GFR: Estimated Creatinine Clearance: 21.5 mL/min (A) (by C-G formula based on SCr of 1.22 mg/dL (H)). Liver Function Tests: Recent Labs  Lab 02/03/18 2027  AST 27  ALT 16  ALKPHOS 92  BILITOT 0.6  PROT 7.2  ALBUMIN 3.7   Recent Labs  Lab 02/03/18 2027  LIPASE 25   No results for input(s): AMMONIA in the last 168 hours. Coagulation Profile: No results for input(s): INR, PROTIME in the last 168 hours. Cardiac Enzymes: No results for input(s): CKTOTAL, CKMB, CKMBINDEX, TROPONINI in the last 168 hours. BNP (last 3 results) No results for input(s): PROBNP in the last 8760 hours. HbA1C: No results for input(s): HGBA1C in the last 72 hours. CBG: No results for input(s): GLUCAP in the last 168 hours. Lipid Profile: No results for input(s): CHOL, HDL, LDLCALC, TRIG, CHOLHDL, LDLDIRECT in the last 72 hours. Thyroid Function Tests: No results for input(s): TSH, T4TOTAL, FREET4, T3FREE, THYROIDAB in the last 72 hours. Anemia Panel: No results for input(s): VITAMINB12, FOLATE, FERRITIN, TIBC, IRON, RETICCTPCT in the last 72 hours. Urine analysis:    Component Value Date/Time   COLORURINE YELLOW 11/09/2014 0437   APPEARANCEUR CLOUDY (A) 11/09/2014 0437   LABSPEC 1.016 11/09/2014 0437   PHURINE 6.0 11/09/2014 0437   GLUCOSEU NEGATIVE 11/09/2014 0437   HGBUR SMALL (A) 11/09/2014 0437   BILIRUBINUR neg 04/27/2017 1349   KETONESUR 15 (A) 11/09/2014 0437   PROTEINUR neg 04/27/2017 1349   PROTEINUR 30 (A) 11/09/2014 0437   UROBILINOGEN 0.2 04/27/2017 1349   UROBILINOGEN 0.2 11/09/2014 0437   NITRITE neg 04/27/2017 1349   NITRITE POSITIVE (A) 11/09/2014 0437   LEUKOCYTESUR Negative 04/27/2017 1349   Sepsis Labs: @LABRCNTIP (procalcitonin:4,lacticidven:4) )No results found for this or any previous visit (from the past 240 hour(s)).   Radiological Exams on Admission: Ct Abdomen Pelvis W Contrast  Result Date: 02/03/2018 CLINICAL DATA:  Abd  pain, acute, generalized, with fever. 85 year old with abdominal pain, nausea and vomiting. Generalized weakness. EXAM: CT ABDOMEN AND PELVIS WITH CONTRAST TECHNIQUE: Multidetector CT imaging of the abdomen and pelvis was performed using the standard protocol following bolus administration of intravenous contrast. CONTRAST:  76mL OMNIPAQUE IOHEXOL 300 MG/ML  SOLN COMPARISON:  CT 05/27/2017 FINDINGS: Lower chest: Clustered nodular opacities within the lingula, left lower lobe, and to a lesser degree right middle lobe. Aortic atherosclerosis and tortuosity. Heart is normal in size. Hepatobiliary: Postcholecystectomy with chronic pneumobilia and prominent peripheral intrahepatic bile ducts. No focal hepatic lesion. Pancreas: No ductal dilatation or inflammation. Spleen: Normal in size without focal abnormality. Adrenals/Urinary Tract: Normal adrenal glands. No hydronephrosis or perinephric edema. Homogeneous renal enhancement with symmetric excretion on delayed phase imaging. Tiny bilateral renal cortical hypodensities are too small to characterize but likely small cysts. Urinary bladder is physiologically distended without wall thickening. Stomach/Bowel: Bowel evaluation is limited in the absence of enteric contrast and paucity of intra-abdominal fat. Small hiatal hernia. Fluid-filled stomach with mild mucosal enhancement. Duodenal diverticulum without acute inflammation. Streak artifact from spinal stimulator battery pack in the right lower quadrant partially obscures evaluation of regional bowel loops. Allowing for this, mild small bowel wall enhancement without obstruction. The appendix demonstrate similar enhancement to small bowel without periappendiceal inflammation. There is colonic tortuosity. Descending and sigmoid colon are nondistended, difficult to exclude underlying wall thickening. Mild diverticulosis without evidence diverticulitis. Mild rectal distension. Vascular/Lymphatic: Aortic atherosclerosis and  tortuosity. No aneurysm. Portal and mesenteric veins are patent. No acute vascular findings. No definite enlarged abdominal or pelvic lymph nodes. Reproductive: Status post hysterectomy. No adnexal masses. Other: Battery packs from 2 spinal stimulators 1 in the right anterior abdominal wall 1 in the posterior left flank. Leads appear intact where visualized. Small volume of ascites in the abdomen and pelvis. No free air.  Musculoskeletal: Scoliosis and multilevel degenerative change throughout the spine. There are no acute or suspicious osseous abnormalities. IMPRESSION: 1. Fluid-filled stomach and small bowel with mild mucosal enhancement, suggesting gastroenteritis. Descending and sigmoid colon are nondistended, difficult to exclude an element of colonic wall thickening/colitis. 2. Small volume of ascites is likely reactive. 3. Clustered nodular opacities in the lingula, left lower lobe and to a lesser degree right middle lobe, likely infectious. Distribution is atypical for that seen with aspiration. 4.  Aortic Atherosclerosis (ICD10-I70.0). Electronically Signed   By: Keith Rake M.D.   On: 02/03/2018 22:57   Dg Chest Port 1 View  Result Date: 02/03/2018 CLINICAL DATA:  Dyspnea EXAM: PORTABLE CHEST 1 VIEW COMPARISON:  06/29/2015 chest radiograph. FINDINGS: Inferior approach spinal stimulator leads terminate over the lower thoracic spinal canal. Stable cardiomediastinal silhouette with normal heart size. No pneumothorax. No pleural effusion. Lungs appear clear, with no acute consolidative airspace disease and no pulmonary edema. IMPRESSION: No active disease. Electronically Signed   By: Ilona Sorrel M.D.   On: 02/03/2018 20:27     EKG: Independently reviewed.  Sinus rhythm, tachycardia, QTC 444, LAE, nonspecific T wave change.  Assessment/Plan Principal Problem:   Sepsis ([place]) Active Problems:   Essential hypertension   Chronic diastolic CHF (congestive heart failure) (HCC)   GERD   Acute on  chronic respiratory failure with hypoxia (HCC)   COPD (chronic obstructive pulmonary disease) (HCC)   Aspiration pneumonia (HCC)   Colitis   CKD (chronic kidney disease), stage II   Nausea & vomiting   Abdominal pain   Sepsis: pt isn't septic with hypotension, fever, tachycardia noted. Lactic acid is elevated at 3.462.5. Sepsis is likely due to possible colitis and possible aspiration pneumonia. Patient responded to IV fluid resuscitation. Blood pressure improved to 84/54 to SBP>90 after 2L NS bolus. -will admit to SDU as inpt -continue IVF: 2.5 L NS bolus, then 125 cc/h -Abx: Vancomycin, cefepime and the Flagyl was started in ED, will continue.  -trend lactic acid and get pro-calcitonin -Follow up blood culture and a urinalysis  Nausea, vomiting, abdominal pain: CT abdomen/pelvis findings are suggesting gastroenteritis, but cannot exclude an element of colonic wall thickening/colitis. Pt has hx of pancolitis, will treat pt as possible colitis: -Abx as above -prn Zofran. -IV as above  Acute on chronic respiratory failure with hypoxia due to possible aspiration pneumonia versus lobar pneumonia. COPD may have contributed partially, but patient does not have wheezing or rhonchi on auscultation.,  -Abx as above -f/u sputum culture, urine antigen for legionella and streptococci -Bronchodilators and prn Mucinex  Essential hypertension: -hold Bp meds due to hypotension  Chronic diastolic CHF (congestive heart failure) ([place]): 2-D echo on 06/15/17 showed EF of 55-60% with grade 1 diastolic dysfunction. Patient does not have leg edema or JVD. No pulmonary edema on chest x-ray. CHF seems to be compensated. Patient is not taking diuretics at home. -check BNP  GERD: -protonix  COPD (chronic obstructive pulmonary disease) ([place]): no wheezing or rhonchi on auscultation. -Bronchodilators  CKD (chronic kidney disease), stage II: Slightly worsened. Baseline creatinine is less than 1.0. Her  creatinine is 1.2, BUN 23, likely due to prerenal failure due to dehydration. ATN is also possible due to hypotension. -IV fluid as above -Follow-up renal function by [facility]    Inpatient status:  # Patient requires inpatient status due to high intensity of service, high risk for further deterioration and high frequency of surveillance required.  I certify that at the point of admission  it is my clinical judgment that the patient will require inpatient hospital care spanning beyond 2 midnights from the point of admission.  . This patient has multiple chronic comorbidities including hypertension, COPD on 2 L nasal cannula oxygen at night, GERD, CHF, pancreatitis, pancolitis, UTI, CKD- 2, GERD . Now patient has presenting symptoms include nausea, vomiting, abdominal pain and shortness breath. . The worrisome physical exam findings include abdominal tenderness, hypotension . The initial radiographic and laboratory data are worrisome because of worsening renal function, sepsis, elevated lactic acid, possible colitis and aspiration pneumonia on CT scan. . Current medical needs: please see my assessment and plan . Predictability of an adverse outcome (risk): Patient has multiple comorbidities, now presents with severe sepsis with hypotension, possible colitis and aspiration pneumonia.  Patient is at high risk of deteriorating.  Will need to stay in hospital for at least 2 days.    DVT ppx: SQ Lovenox Code Status: Full code Family Communication:  Yes, patient's daughter and granddaughter  at bed side Disposition Plan:  Anticipate discharge back to previous home environment Consults called: None Admission status:   SDU/inpation       Date of Service 02/04/2018    Ivor Costa [facility] Pager [phone]  If 7PM-7AM, please contact night-coverage [email or web address] Password [email or web address] 02/04/2018, 12:03 AM

## 2018-02-03 NOTE — ED Notes (Signed)
Resp notified of need for I-stat ABG

## 2018-02-03 NOTE — Telephone Encounter (Signed)
Talked to patient's daughter [name], she reports patient was sleep at the time. They picked up suppositories and started using. Advised if not better or if abdominal pain to go to the ER. She voiced understanding. [name]

## 2018-02-03 NOTE — ED Notes (Signed)
I-Stat Lactic Acid results of 2.25 reported to Dr. Tomi Bamberger.

## 2018-02-03 NOTE — ED Provider Notes (Signed)
[facility] EMERGENCY DEPARTMENT Provider Note   CSN: [id number] Arrival date & time: 02/03/18  1911     History   Chief Complaint Chief Complaint  Patient presents with  . Abdominal Pain  . Emesis    HPI [name] [name] is a 85 y.o. female with history of CHF, COPD on 2 L via nasal cannula at night as needed, chronic back pain, GERD, HLD, HTN, recurrent UTI, scoliosis presenting for evaluation of acute onset, progressively worsening nausea and vomiting since early this morning.  She reports multiple episodes of nonbloody nonbilious emesis and has not been able to tolerate p.o. Phenergan or Phenergan suppository.  Denies fevers.  She reports she thinks she has pneumonia as she also has worsening shortness of breath.  The patient endorses pain to the epigastric region and when asked about chest pain she again motions to the epigastric region of the abdomen.  Denies diarrhea but cannot remember when her last bowel movement was.  Patient's granddaughter at the bedside reports that she frequently presents with symptoms suggestive of colitis but is septic and sometimes have aspiration pneumonia as well.  She reports that the patient has been eating food left over from Christmas and lives at home alone.  She does not think the patient was recently on any antibiotics.  She is full code.  The history is provided by the patient, the EMS personnel and a relative.    Past Medical History:  Diagnosis Date  . Abdominal pain    Resolved  . Cardiac arrhythmia   . CHF (congestive heart failure) ([place])    Diastolic  on ECHO 2683  . Choledocholithiasis   . Chronic anemia   . Chronic back pain   . COPD (chronic obstructive pulmonary disease) (HCC)    oxygen dependent at  night 2LPM  . Daily headache   . GERD (gastroesophageal reflux disease)   . HCAP (healthcare-associated pneumonia) 03/28/2014  . Hiatal hernia   . Hyperlipidemia   . Hypertension   . On home oxygen therapy    "1L during the day; 2L q hs" (03/28/2014)  . Osteoarthritis    "pretty much q where"   . Osteoporosis   . Pancolitis (HCC)    Infectious vs. inflammatory  . Pancreatitis    History of  . Pneumonia 1930's; 2011 X 2; 02/2014  . PONV (postoperative nausea and vomiting)   . PUD (peptic ulcer disease)   . Reactive airway disease that is not asthma   . Recurrent UTI (urinary tract infection)    "here lately" (03/28/2014)  . Scoliosis deformity of spine    history of intractable back pain    Patient Active Problem List   Diagnosis Date Noted  . Colitis 02/03/2018  . CKD (chronic kidney disease), stage II 02/03/2018  . Nausea & vomiting 02/03/2018  . Abdominal pain 02/03/2018  . Chronic respiratory failure ([place]) 05/17/2015  . Sepsis ([place]) 11/09/2014  . Aspiration pneumonia ([place]) 11/09/2014  . Other allergic rhinitis 04/19/2014  . COPD (chronic obstructive pulmonary disease) ([place]) 04/19/2014  . Acute on chronic respiratory failure with hypoxia ([place])   . Other pancytopenia ([place])   . Headache(784.0) 08/11/2013  . Hypocalcemia 02/07/2013  . Memory change 09/28/2012  . Abdominal pain, chronic, epigastric 09/28/2012  . Chronic tension headaches 09/15/2012  . DDD (degenerative disc disease), lumbar 05/31/2012  . Vitamin D deficiency 08/17/2010  . Chronic back pain 06/02/2010  . PANCREATIC INSUFFICIENCY 03/19/2010  . ANEMIA 12/27/2009  . Hearing loss 08/17/2009  .  Chronic pulmonary heart disease ([place]) 04/12/2009  . Osteoporosis 02/12/2009  . HYPERCHOLESTEROLEMIA 02/01/2009  . Essential hypertension 02/01/2009  . CARDIAC ARRHYTHMIA 02/01/2009  . Chronic diastolic CHF (congestive heart failure) ([place]) 02/01/2009  . COPD with emphysema, gold stage C. 02/01/2009  . GERD 02/01/2009  . Peptic ulcer 02/01/2009  . Diaphragmatic hernia 02/01/2009  . OSTEOARTHRITIS 02/01/2009    Past Surgical History:  Procedure Laterality Date  . ABDOMINAL HYSTERECTOMY  1970s  . BACK SURGERY     x 5  .  BREAST CYST EXCISION Right 1960's  . CERVICAL LAMINECTOMY  1970s  . CHOLECYSTECTOMY  2008  . ERCP W/ SPHICTEROTOMY  02/2010  . ESOPHAGOGASTRODUODENOSCOPY N/A 11/14/2013   Procedure: ESOPHAGOGASTRODUODENOSCOPY (EGD);  Surgeon: Missy Sabins, MD;  Location[facility] ENDOSCOPY;  Service: Endoscopy;  Laterality: N/A;  . FLEXIBLE SIGMOIDOSCOPY N/A 01/24/2013   Procedure: FLEXIBLE SIGMOIDOSCOPY;  Surgeon: Missy Sabins, MD;  Location: [facility];  Service: Endoscopy;  Laterality: N/A;  . PAIN PUMP IMPLANTATION  ~ 2010   back, dilaudid and bupravacaine  . SPINAL CORD STIMULATOR IMPLANT  ~ 2009  . SPINE SURGERY  [phone]     OB History   No obstetric history on file.      Home Medications    Prior to Admission medications   Medication Sig Start Date End Date Taking? Authorizing Provider  acetaminophen (TYLENOL) 500 MG tablet Take 500 mg by mouth 2 (two) times daily as needed for headache.    [provider]  albuterol (PROVENTIL HFA;VENTOLIN HFA) 108 (90 Base) MCG/ACT inhaler Inhale 1-2 puffs into the lungs every 6 (six) hours as needed (wheezing & shortness of breath). Reported on 02/28/2015 10/09/15   Debbrah Alar, NP  Ascorbic Acid (VITAMIN C) 500 MG tablet Take 500 mg by mouth daily.      [provider]  azithromycin (ZITHROMAX Z-PAK) 250 MG tablet 2 tabs by mouth today, then one tab once daily for 4 more days. 05/04/17   Debbrah Alar, NP  budesonide-formoterol (SYMBICORT) 160-4.5 MCG/ACT inhaler Inhale 2 puffs 2 (two) times daily into the lungs. 12/05/16   Parrett, Fonnie Mu, NP  butalbital-acetaminophen-caffeine (FIORICET, ESGIC) 50-325-40 MG tablet Take 1 tablet by mouth every 4 (four) hours as needed for headache.     [provider]  Calcium Carbonate-Vitamin D (CALCIUM 600-D) 600-400 MG-UNIT per tablet Take 1 tablet by mouth 2 (two) times daily with a meal.      [provider]  cefdinir (OMNICEF) 300 MG capsule Take 1 capsule (300 mg  total) by mouth 2 (two) times daily. 04/27/17   Debbrah Alar, NP  cholecalciferol (VITAMIN D) 1000 units tablet Take 1,000 Units by mouth daily.    [provider]  diclofenac sodium (VOLTAREN) 1 % GEL Place 1 application onto the skin 2 (two) times daily as needed. 06/06/15   [provider]  gabapentin (NEURONTIN) 100 MG capsule Take 1 capsule (100 mg total) by mouth 2 (two) times daily. 08/23/13   Debbrah Alar, NP  hyoscyamine (LEVSIN SL) 0.125 MG SL tablet Take 0.125 mg by mouth as needed. Abdominal pain 11/24/11   [provider]  lactulose (CHRONULAC) 10 GM/15ML solution TAKE 2 TEASPOONFUL (10ML) BY MOUTH AT BEDTIME AS NEEDED FOR CONSTIPATION 01/29/18   Debbrah Alar, NP  Multiple Vitamins-Minerals (MULTIVITAMIN WITH MINERALS) tablet Take 1 tablet by mouth daily. 07/01/15   Debbrah Alar, NP  nitroGLYCERIN (NITROSTAT) 0.4 MG SL tablet Place 1 tablet (0.4 mg total) under the tongue every 5 (  five) minutes as needed for chest pain. 11/11/10   Darlin Coco, MD  omeprazole (PRILOSEC) 40 MG capsule Take 1 capsule (40 mg total) by mouth 2 (two) times daily. 11/16/13   Thurnell Lose, MD  oxyCODONE-acetaminophen (PERCOCET) 7.5-325 MG tablet Take 1 tablet by mouth 2 (two) times daily as needed for severe pain.    [provider]  Probiotic Product (PROBIOTIC DAILY PO) Take 1 tablet by mouth daily at 12 noon.    [provider]  promethazine (PHENERGAN) 25 MG suppository Place 1 suppository (25 mg total) rectally every 6 (six) hours as needed for nausea or vomiting. 02/03/18   Debbrah Alar, NP  VITAMIN E PO Take 1 tablet by mouth daily.    [provider]    Family History Family History  Problem Relation Age of Onset  . Cancer Mother        uterine  . Heart disease Father   . Hypertension Other   . Osteoporosis Neg Hx     Social History Social History   Tobacco Use  . Smoking status: Former Smoker     Packs/day: 1.00    Years: 12.00    Pack years: 12.00    Types: Cigarettes    Last attempt to quit: 01/28/1988    Years since quitting: 30.0  . Smokeless tobacco: Never Used  Substance Use Topics  . Alcohol use: No  . Drug use: No     Allergies   Cefdinir; Codeine; Influenza vaccines; Morphine; Sulfa antibiotics; and Zoledronic acid   Review of Systems Review of Systems  Constitutional: Negative for chills and fever.  Respiratory: Positive for shortness of breath.   Cardiovascular: Positive for chest pain.  Gastrointestinal: Positive for abdominal pain, constipation, nausea and vomiting.  All other systems reviewed and are negative.    Physical Exam Updated Vital Signs BP (!) 84/54   Pulse (!) 117   Temp 100.3 F (37.9 C) (Rectal)   Resp 16   Ht 5\' 3"  (1.6 m)   Wt 44.5 kg   SpO2 93%   BMI 17.36 kg/m   Physical Exam Vitals signs and nursing note reviewed.  Constitutional:      General: She is not in acute distress.    Appearance: She is well-developed.     Comments: Thin, chronically ill in appearance  HENT:     Head: Normocephalic and atraumatic.  Eyes:     General:        Right eye: No discharge.        Left eye: No discharge.     Conjunctiva/sclera: Conjunctivae normal.  Neck:     Vascular: No JVD.     Trachea: No tracheal deviation.  Cardiovascular:     Rate and Rhythm: Regular rhythm. Tachycardia present.  Pulmonary:     Comments: Tachypneic, SPO2 saturations 88 to 94% on 4 L via nasal cannula.  Globally diminished breath sounds Abdominal:     General: There is no distension.     Palpations: Abdomen is soft.     Tenderness: There is abdominal tenderness in the epigastric area. There is no right CVA tenderness or left CVA tenderness.  Skin:    General: Skin is warm and dry.     Findings: No erythema.  Neurological:     General: No focal deficit present.     Mental Status: She is alert.  Psychiatric:        Behavior: Behavior normal.      ED  Treatments /  Results  Labs (all labs ordered are listed, but only abnormal results are displayed) Labs Reviewed  COMPREHENSIVE METABOLIC PANEL - Abnormal; Notable for the following components:      Result Value   Chloride 96 (*)    Glucose, Bld 179 (*)    Creatinine, Ser 1.22 (*)    GFR calc non Af Amer 39 (*)    GFR calc Af Amer 45 (*)    Anion gap 16 (*)    All other components within normal limits  CBC WITH DIFFERENTIAL/PLATELET - Abnormal; Notable for the following components:   RBC 5.90 (*)    Hemoglobin 17.4 (*)    HCT 56.1 (*)    Neutro Abs 7.8 (*)    Lymphs Abs 0.5 (*)    All other components within normal limits  BRAIN NATRIURETIC PEPTIDE - Abnormal; Notable for the following components:   B Natriuretic Peptide 199.2 (*)    All other components within normal limits  I-STAT CG4 LACTIC ACID, ED - Abnormal; Notable for the following components:   Lactic Acid, Venous 3.46 (*)    All other components within normal limits  I-STAT ARTERIAL BLOOD GAS, ED - Abnormal; Notable for the following components:   pCO2 arterial 51.7 (*)    pO2, Arterial 163.0 (*)    Bicarbonate 29.1 (*)    All other components within normal limits  I-STAT CG4 LACTIC ACID, ED - Abnormal; Notable for the following components:   Lactic Acid, Venous 2.25 (*)    All other components within normal limits  CULTURE, BLOOD (ROUTINE X 2)  CULTURE, BLOOD (ROUTINE X 2)  EXPECTORATED SPUTUM ASSESSMENT W REFEX TO RESP CULTURE  GRAM STAIN  LIPASE, BLOOD  URINALYSIS, ROUTINE W REFLEX MICROSCOPIC  STREP PNEUMONIAE URINARY ANTIGEN  LEGIONELLA PNEUMOPHILA SEROGP 1 UR AG  LACTIC ACID, PLASMA  LACTIC ACID, PLASMA  PROCALCITONIN  I-STAT TROPONIN, ED    EKG EKG Interpretation  Date/Time:  Wednesday February 03 2018 19:11:41 EST Ventricular Rate:  138 PR Interval:    QRS Duration: 76 QT Interval:  293 QTC Calculation: 444 R Axis:   65 Text Interpretation:  Sinus tachycardia Probable left atrial enlargement  Probable left ventricular hypertrophy Anterior ST elevation, probably due to LVH Since last tracing rate faster t wave amplitude increased since prior Confirmed by Dorie Rank [phone]) on 02/03/2018 7:16:18 PM   Radiology Ct Abdomen Pelvis W Contrast  Result Date: 02/03/2018 CLINICAL DATA:  Abd pain, acute, generalized, with fever. 85 year old with abdominal pain, nausea and vomiting. Generalized weakness. EXAM: CT ABDOMEN AND PELVIS WITH CONTRAST TECHNIQUE: Multidetector CT imaging of the abdomen and pelvis was performed using the standard protocol following bolus administration of intravenous contrast. CONTRAST:  21mL OMNIPAQUE IOHEXOL 300 MG/ML  SOLN COMPARISON:  CT 05/27/2017 FINDINGS: Lower chest: Clustered nodular opacities within the lingula, left lower lobe, and to a lesser degree right middle lobe. Aortic atherosclerosis and tortuosity. Heart is normal in size. Hepatobiliary: Postcholecystectomy with chronic pneumobilia and prominent peripheral intrahepatic bile ducts. No focal hepatic lesion. Pancreas: No ductal dilatation or inflammation. Spleen: Normal in size without focal abnormality. Adrenals/Urinary Tract: Normal adrenal glands. No hydronephrosis or perinephric edema. Homogeneous renal enhancement with symmetric excretion on delayed phase imaging. Tiny bilateral renal cortical hypodensities are too small to characterize but likely small cysts. Urinary bladder is physiologically distended without wall thickening. Stomach/Bowel: Bowel evaluation is limited in the absence of enteric contrast and paucity of intra-abdominal fat. Small hiatal hernia. Fluid-filled stomach with mild mucosal enhancement. Duodenal  diverticulum without acute inflammation. Streak artifact from spinal stimulator battery pack in the right lower quadrant partially obscures evaluation of regional bowel loops. Allowing for this, mild small bowel wall enhancement without obstruction. The appendix demonstrate similar enhancement to  small bowel without periappendiceal inflammation. There is colonic tortuosity. Descending and sigmoid colon are nondistended, difficult to exclude underlying wall thickening. Mild diverticulosis without evidence diverticulitis. Mild rectal distension. Vascular/Lymphatic: Aortic atherosclerosis and tortuosity. No aneurysm. Portal and mesenteric veins are patent. No acute vascular findings. No definite enlarged abdominal or pelvic lymph nodes. Reproductive: Status post hysterectomy. No adnexal masses. Other: Battery packs from 2 spinal stimulators 1 in the right anterior abdominal wall 1 in the posterior left flank. Leads appear intact where visualized. Small volume of ascites in the abdomen and pelvis. No free air. Musculoskeletal: Scoliosis and multilevel degenerative change throughout the spine. There are no acute or suspicious osseous abnormalities. IMPRESSION: 1. Fluid-filled stomach and small bowel with mild mucosal enhancement, suggesting gastroenteritis. Descending and sigmoid colon are nondistended, difficult to exclude an element of colonic wall thickening/colitis. 2. Small volume of ascites is likely reactive. 3. Clustered nodular opacities in the lingula, left lower lobe and to a lesser degree right middle lobe, likely infectious. Distribution is atypical for that seen with aspiration. 4.  Aortic Atherosclerosis (ICD10-I70.0). Electronically Signed   By: Keith Rake M.D.   On: 02/03/2018 22:57   Dg Chest Port 1 View  Result Date: 02/03/2018 CLINICAL DATA:  Dyspnea EXAM: PORTABLE CHEST 1 VIEW COMPARISON:  06/29/2015 chest radiograph. FINDINGS: Inferior approach spinal stimulator leads terminate over the lower thoracic spinal canal. Stable cardiomediastinal silhouette with normal heart size. No pneumothorax. No pleural effusion. Lungs appear clear, with no acute consolidative airspace disease and no pulmonary edema. IMPRESSION: No active disease. Electronically Signed   By: Ilona Sorrel M.D.   On:  02/03/2018 20:27    Procedures .Critical Care Performed by: Renita Papa, PA-C Authorized by: Renita Papa, PA-C   Critical care provider statement:    Critical care time (minutes):  40   Critical care was necessary to treat or prevent imminent or life-threatening deterioration of the following conditions:  Sepsis   Critical care was time spent personally by me on the following activities:  Discussions with consultants, evaluation of patient's response to treatment, examination of patient, ordering and performing treatments and interventions, ordering and review of laboratory studies, ordering and review of radiographic studies, pulse oximetry, re-evaluation of patient's condition, obtaining history from patient or surrogate and review of old charts   I assumed direction of critical care for this patient from another provider in my specialty: no     (including critical care time)  Medications Ordered in ED Medications  metroNIDAZOLE (FLAGYL) IVPB 500 mg (500 mg Intravenous New Bag/Given 02/03/18 2156)  vancomycin (VANCOCIN) IVPB 1000 mg/200 mL premix (1,000 mg Intravenous New Bag/Given 02/03/18 2305)  ceFEPIme (MAXIPIME) 1 g in sodium chloride 0.9 % 100 mL IVPB (has no administration in time range)  vancomycin (VANCOCIN) IVPB 750 mg/150 ml premix (has no administration in time range)  sodium chloride 0.9 % bolus 500 mL (500 mLs Intravenous New Bag/Given 02/03/18 2333)  sodium chloride 0.9 % bolus 1,000 mL (has no administration in time range)  0.9 %  sodium chloride infusion (has no administration in time range)  ipratropium-albuterol (DUONEB) 0.5-2.5 (3) MG/3ML nebulizer solution 3 mL (has no administration in time range)  levalbuterol (XOPENEX) nebulizer solution 1.25 mg (has no administration in time range)  dextromethorphan-guaiFENesin (MUCINEX DM) 30-600 MG per 12 hr tablet 1 tablet (has no administration in time range)  enoxaparin (LOVENOX) injection 30 mg (has no administration in time  range)  ondansetron (ZOFRAN) injection 4 mg (has no administration in time range)  hydrALAZINE (APRESOLINE) injection 5 mg (has no administration in time range)  acetaminophen (TYLENOL) tablet 650 mg (has no administration in time range)  zolpidem (AMBIEN) tablet 5 mg (has no administration in time range)  sodium chloride 0.9 % bolus 1,000 mL (0 mLs Intravenous Stopped 02/03/18 2158)  sodium chloride 0.9 % bolus 500 mL (0 mLs Intravenous Stopped 02/03/18 2304)  ceFEPIme (MAXIPIME) 2 g in sodium chloride 0.9 % 100 mL IVPB (0 g Intravenous Stopped 02/03/18 2155)  ondansetron (ZOFRAN) injection 4 mg (4 mg Intravenous Given 02/03/18 2106)  acetaminophen (TYLENOL) tablet 650 mg (650 mg Oral Given 02/03/18 2156)  iohexol (OMNIPAQUE) 300 MG/ML solution 100 mL (100 mLs Intravenous Contrast Given 02/03/18 2214)     Initial Impression / Assessment and Plan / ED Course  I have reviewed the triage vital signs and the nursing notes.  Pertinent labs & imaging results that were available during my care of the patient were reviewed by me and considered in my medical decision making (see chart for details).     Patient presenting for evaluation of shortness of breath, nausea and vomiting.  Patient presenting with low-grade fevers and tachycardia initially.  Found to have an elevated lactate so code sepsis was initiated and she was given a 30 cc/kg bolus with a decrease in her blood pressure but improvement in her tachycardia.  Lab work suggests dehydration with hemoconcentration given significantly elevated hemoglobin and hematocrit.  She does have a left shift.  Lab work also significant for mildly elevated creatinine and elevated anion gap of 16.  CT abdomen and pelvis shows evidence of gastroenteritis and possible colitis as well as multifocal pneumonia.  Broad-spectrum antibiotics were given including vancomycin, cefepime, and Flagyl.  Spoke with Dr. Blaine Hamper with [facility] who agrees to assume care of patient  and bring her into the hospital for further evaluation and management.  Patient seen and evaluated by Dr. Tomi Bamberger who agrees with assessment and plan at this time.  Sepsis - Repeat Assessment  Performed at:    11:30PM Vitals     Blood pressure (!) 84/54, pulse (!) 117, temperature 100.3 F (37.9 C), temperature source Rectal, resp. rate 16, height 5\' 3"  (1.6 m), weight 44.5 kg, SpO2 93 %.  Heart:     Tachycardic  Lungs:    globally diminished  Capillary Refill:   <2 sec  Peripheral Pulse:   Radial pulse palpable  Skin:     Pale, Dry.       Final Clinical Impressions(s) / ED Diagnoses   Final diagnoses:  Sepsis with acute hypoxic respiratory failure and septic shock, due to unspecified organism [facility])    ED Discharge Orders    None       Debroah Baller 02/04/18 0008    Dorie Rank, MD 02/04/18 1525

## 2018-02-03 NOTE — Progress Notes (Signed)
Pharmacy Antibiotic Note  [name] [name] is a 85 y.o. female admitted on 02/03/2018 with sepsis.   Plan: Vanc 1 g x 1 then 750 mg q48h Cefepime 2 g x 1 then 1 g q24h Metronidazole 500 mg q8 Monitor renal fx cx vanc lvls prn  Height: 5\' 3"  (160 cm) Weight: 98 lb (44.5 kg) IBW/kg (Calculated) : 52.4  Temp (24hrs), Avg:99.1 F (37.3 C), Min:97.9 F (36.6 C), Max:100.3 F (37.9 C)  Recent Labs  Lab 02/03/18 2027 02/03/18 2041  CREATININE 1.22*  --   LATICACIDVEN  --  3.46*    Estimated Creatinine Clearance: 21.5 mL/min (A) (by C-G formula based on SCr of 1.22 mg/dL (H)).    Allergies  Allergen Reactions  . Cefdinir Diarrhea  . Codeine Diarrhea and Nausea And Vomiting    REACTION: n/v/d, HA  . Influenza Vaccines     Stomach pain, headache  . Morphine Diarrhea and Nausea And Vomiting    REACTION: n/v/d, HA  . Sulfa Antibiotics Nausea Only    Sick    . Zoledronic Acid Swelling    REACTION: Severe edema   Vancomycin 750 mg IV Q 48 hrs. Goal AUC 400-550. Expected AUC: 525 SCr used: 1.22  Levester Fresh, PharmD, BCPS, BCCCP Clinical Pharmacist [phone]  Please check AMION for all [place] numbers  02/03/2018 9:23 PM

## 2018-02-03 NOTE — ED Notes (Signed)
ED Provider at bedside. 

## 2018-02-03 NOTE — Telephone Encounter (Signed)
Pt.'s daughter, [name] reports pt. Started vomiting around 0400 this morning. 3-5 times. Can not keep Phenergan pill down. Requesting Phenergan suppository be sent in. No fever or diarrhea. Instructed daughter to watch for signs of dehydration and try to keep her hydrated. Daughter reports she "has bouts of this every few months." Uses [facility]. Contact number for daughter [phone].  Answer Assessment - Initial Assessment Questions 1. VOMITING SEVERITY: "How many times have you vomited in the past 24 hours?"     - MILD:  1 - 2 times/day    - MODERATE: 3 - 5 times/day, decreased oral intake without significant weight loss or symptoms of dehydration    - SEVERE: 6 or more times/day, vomits everything or nearly everything, with significant weight loss, symptoms of dehydration      Moderate 2. ONSET: "When did the vomiting begin?"      0400 3. FLUIDS: "What fluids or food have you vomited up today?" "Have you been able to keep any fluids down?"     No liquids 4. ABDOMINAL PAIN: "Are your having any abdominal pain?" If yes : "How bad is it and what does it feel like?" (e.g., crampy, dull, intermittent, constant)      No 5. DIARRHEA: "Is there any diarrhea?" If so, ask: "How many times today?"      No 6. CONTACTS: "Is there anyone else in the family with the same symptoms?"      Yes - daughter had bug last week 7. CAUSE: "What do you think is causing your vomiting?"     Unsure 8. HYDRATION STATUS: "Any signs of dehydration?" (e.g., dry mouth [not only dry lips], too weak to stand) "When did you last urinate?"     Hydrated 9. OTHER SYMPTOMS: "Do you have any other symptoms?" (e.g., fever, headache, vertigo, vomiting blood or coffee grounds, recent head injury)     No 10. PREGNANCY: "Is there any chance you are pregnant?" "When was your last menstrual period?"       No  Protocols used: [facility]

## 2018-02-03 NOTE — ED Notes (Signed)
Patient transported to CT 

## 2018-02-03 NOTE — ED Notes (Signed)
Admitting at bedside 

## 2018-02-04 DIAGNOSIS — E43 Unspecified severe protein-calorie malnutrition: Secondary | ICD-10-CM

## 2018-02-04 LAB — MRSA PCR SCREENING: MRSA by PCR: NEGATIVE

## 2018-02-04 LAB — URINALYSIS, ROUTINE W REFLEX MICROSCOPIC
Bacteria, UA: NONE SEEN
Bilirubin Urine: NEGATIVE
Glucose, UA: NEGATIVE mg/dL
Hgb urine dipstick: NEGATIVE
Ketones, ur: 5 mg/dL — AB
Leukocytes, UA: NEGATIVE
Nitrite: NEGATIVE
Protein, ur: 30 mg/dL — AB
Specific Gravity, Urine: 1.038 — ABNORMAL HIGH (ref 1.005–1.030)
pH: 6 (ref 5.0–8.0)

## 2018-02-04 LAB — LACTIC ACID, PLASMA: Lactic Acid, Venous: 1.2 mmol/L (ref 0.5–1.9)

## 2018-02-04 LAB — PROCALCITONIN: Procalcitonin: 0.57 ng/mL

## 2018-02-04 LAB — STREP PNEUMONIAE URINARY ANTIGEN: Strep Pneumo Urinary Antigen: NEGATIVE

## 2018-02-04 MED ORDER — VITAMIN D 25 MCG (1000 UNIT) PO TABS
1000.0000 [IU] | ORAL_TABLET | Freq: Every day | ORAL | Status: DC
  Administered 2018-02-04 – 2018-02-05 (×2): 1000 [IU] via ORAL
  Filled 2018-02-04 (×2): qty 1

## 2018-02-04 MED ORDER — IPRATROPIUM-ALBUTEROL 0.5-2.5 (3) MG/3ML IN SOLN: 3.0000 mL | RESPIRATORY_TRACT | Status: DC | PRN

## 2018-02-04 MED ORDER — ADULT MULTIVITAMIN W/MINERALS CH
1.0000 | ORAL_TABLET | Freq: Every day | ORAL | Status: DC
  Administered 2018-02-04 – 2018-02-05 (×2): 1 via ORAL
  Filled 2018-02-04 (×2): qty 1

## 2018-02-04 MED ORDER — NITROGLYCERIN 0.4 MG SL SUBL: 0.4000 mg | SUBLINGUAL_TABLET | SUBLINGUAL | Status: DC | PRN

## 2018-02-04 MED ORDER — BUTALBITAL-APAP-CAFFEINE 50-325-40 MG PO TABS
1.0000 | ORAL_TABLET | ORAL | Status: DC | PRN
  Administered 2018-02-04 – 2018-02-06 (×5): 1 via ORAL
  Filled 2018-02-04 (×5): qty 1

## 2018-02-04 MED ORDER — VITAMIN E 45 MG (100 UNIT) PO CAPS
100.0000 [IU] | ORAL_CAPSULE | Freq: Every day | ORAL | Status: DC
  Administered 2018-02-04 – 2018-02-05 (×2): 100 [IU] via ORAL
  Filled 2018-02-04 (×3): qty 1

## 2018-02-04 MED ORDER — VITAMIN C 500 MG PO TABS
500.0000 mg | ORAL_TABLET | Freq: Every day | ORAL | Status: DC
  Administered 2018-02-04 – 2018-02-05 (×2): 500 mg via ORAL
  Filled 2018-02-04 (×2): qty 1

## 2018-02-04 MED ORDER — PANTOPRAZOLE SODIUM 40 MG PO TBEC
40.0000 mg | DELAYED_RELEASE_TABLET | Freq: Every day | ORAL | Status: DC
  Administered 2018-02-04 – 2018-02-05 (×2): 40 mg via ORAL
  Filled 2018-02-04 (×2): qty 1

## 2018-02-04 MED ORDER — LACTULOSE 10 GM/15ML PO SOLN
10.0000 g | Freq: Every day | ORAL | Status: DC
  Administered 2018-02-04 – 2018-02-05 (×2): 10 g via ORAL
  Filled 2018-02-04 (×2): qty 15

## 2018-02-04 MED ORDER — CALCIUM CARBONATE-VITAMIN D 500-200 MG-UNIT PO TABS
1.0000 | ORAL_TABLET | Freq: Two times a day (BID) | ORAL | Status: DC
  Administered 2018-02-04 – 2018-02-05 (×4): 1 via ORAL
  Filled 2018-02-04 (×5): qty 1

## 2018-02-04 MED ORDER — MOMETASONE FURO-FORMOTEROL FUM 200-5 MCG/ACT IN AERO
2.0000 | INHALATION_SPRAY | Freq: Two times a day (BID) | RESPIRATORY_TRACT | Status: DC
  Administered 2018-02-04 – 2018-02-05 (×3): 2 via RESPIRATORY_TRACT
  Filled 2018-02-04 (×2): qty 8.8

## 2018-02-04 MED ORDER — SACCHAROMYCES BOULARDII 250 MG PO CAPS
250.0000 mg | ORAL_CAPSULE | Freq: Two times a day (BID) | ORAL | Status: DC
  Administered 2018-02-04 – 2018-02-05 (×4): 250 mg via ORAL
  Filled 2018-02-04 (×5): qty 1

## 2018-02-04 MED ORDER — DICLOFENAC SODIUM 1 % TD GEL: 1.0000 "application " | Freq: Two times a day (BID) | TRANSDERMAL | Status: DC | PRN

## 2018-02-04 MED ORDER — OXYCODONE-ACETAMINOPHEN 7.5-325 MG PO TABS
1.0000 | ORAL_TABLET | Freq: Two times a day (BID) | ORAL | Status: DC | PRN
  Administered 2018-02-04 (×2): 1 via ORAL
  Filled 2018-02-04 (×2): qty 1

## 2018-02-04 NOTE — ED Notes (Signed)
Pt assisted to bedside commode

## 2018-02-04 NOTE — ED Notes (Signed)
Pt feels like she can urinate, purewick in place. Pt attempted to provide urine sample, will cath if no results

## 2018-02-04 NOTE — Progress Notes (Signed)
PROGRESS NOTE    [name] [name]  FBP:[id number] DOB: 01-26-1926 DOA: 02/03/2018 PCP: Debbrah Alar, NP   Brief Narrative:  85 year old with history of essential hypertension, COPD on 2 L nasal cannula at night, GERD, CHF, pancreatitis, CKD stage II came to the hospital complains of nausea, vomiting and shortness of breath.  It started about 1 day prior to the admission.  In the ER she was also noted to be short of breath with hypoxia saturating 88% on room air.  She had to be placed on nasal cannula.  Chest x-ray was negative.  Had low-grade temperature of 100.3.  Lactic acid was elevated.  Patient was diagnosed with likely aspiration pneumonia or nonspecific gastroenteritis.  Started on broad-spectrum antibiotics.   Assessment & Plan:   Principal Problem:   Sepsis ([place]) Active Problems:   Essential hypertension   Chronic diastolic CHF (congestive heart failure) (HCC)   GERD   Acute on chronic respiratory failure with hypoxia (HCC)   COPD (chronic obstructive pulmonary disease) (HCC)   Aspiration pneumonia (HCC)   Colitis   CKD (chronic kidney disease), stage II   Nausea & vomiting   Abdominal pain  Sepsis possibly secondary to aspiration pneumonia Nonspecific gastroenteritis - Chest x-ray shows concerns for possible aspiration in the upper lobes.  CT of the abdomen pelvis suggest colonic wall thickening but cannot rule out colitis. - Continue IV vancomycin, cefepime and Flagyl.  Will narrow down to the next 24 hours.  Probiotic started -We will get speech and swallow evaluation.  Advance diet as tolerated -Trend lactate -Follow-up cultures -Antiemetics PRN -UA is negative  Acute hypoxia likely secondary to aspiration - Pneumonia versus pneumonitis?.  Supplemental oxygen -Bronchodilators PRN.  Supportive care. -Incentive spirometry and flutter valve. -Strep pneumo-negative  Chronic diastolic congestive heart failure ejection fraction 55 to 82%, grade 1 diastolic  dysfunction, class II -Patient does not appear to be volume overloaded at this time.  More or less appears to be euvolemic.  Gentle hydration.  History of COPD - No wheezing heard.  Continue bronchodilators.  No need for steroids  Chronic kidney disease stage II -Renal function appears to be stable.  Severe protein calorie malnutrition -Dietitian consulted.  BMI is less than 20.   DVT prophylaxis: Lovenox Code Status: Full code Family Communication: Daughter at bedside Disposition Plan: Maintain inpatient stay until patient symptoms improved.  Consultants:   None  Procedures:   None  Antimicrobials:   Vancomycin 1/8>  Cefepime 1/8>  Flagyl 1/8>   Subjective: Patient states she feels little bit better than last night as she is not as short of breath.  Still has feeling of nausea.  Unsure if she is able to tolerate orall yet.  Review of Systems Otherwise negative except as per HPI, including: General: Denies fever, chills, night sweats or unintended weight loss. Resp: Denies cough, wheezing Cardiac: Denies chest pain, palpitations, orthopnea, paroxysmal nocturnal dyspnea. GI: Denies  diarrhea or constipation GU: Denies dysuria, frequency, hesitancy or incontinence MS: Denies muscle aches, joint pain or swelling Neuro: Denies headache, neurologic deficits (focal weakness, numbness, tingling), abnormal gait Psych: Denies anxiety, depression, SI/HI/AVH Skin: Denies new rashes or lesions ID: Denies sick contacts, exotic exposures, travel  Objective: Vitals:   02/04/18 1200 02/04/18 1300 02/04/18 1344 02/04/18 1400  BP: (!) 102/54 129/66  (!) 107/55  Pulse: 90 (!) 54    Resp: 12 18  16   Temp:      TempSrc:      SpO2: 100% 95% 93%  Weight:      Height:        Intake/Output Summary (Last 24 hours) at 02/04/2018 1425 Last data filed at 02/04/2018 0551 Gross per 24 hour  Intake 1600 ml  Output 800 ml  Net 800 ml   Filed Weights   02/03/18 1921  Weight: 44.5  kg    Examination:  General exam: Appears calm and comfortable; elderly with bilateral temporal wasting Respiratory system: Diminished BS b/l LE Cardiovascular system: S1 & S2 heard, RRR. No JVD, murmurs, rubs, gallops or clicks. No pedal edema. Gastrointestinal system: Abdomen is nondistended, soft and nontender. No organomegaly or masses felt. Normal bowel sounds heard. Central nervous system: Alert and oriented. No focal neurological deficits. Extremities: Symmetric 5 x 5 power. Skin: No rashes, lesions or ulcers Psychiatry: Judgement and insight appear normal. Mood & affect appropriate.     Data Reviewed:   CBC: Recent Labs  Lab 02/03/18 2027  WBC 8.9  NEUTROABS 7.8*  HGB 17.4*  HCT 56.1*  MCV 95.1  PLT 825   Basic Metabolic Panel: Recent Labs  Lab 02/03/18 2027  NA 138  K 4.8  CL 96*  CO2 26  GLUCOSE 179*  BUN 23  CREATININE 1.22*  CALCIUM 9.1   GFR: Estimated Creatinine Clearance: 21.5 mL/min (A) (by C-G formula based on SCr of 1.22 mg/dL (H)). Liver Function Tests: Recent Labs  Lab 02/03/18 2027  AST 27  ALT 16  ALKPHOS 92  BILITOT 0.6  PROT 7.2  ALBUMIN 3.7   Recent Labs  Lab 02/03/18 2027  LIPASE 25   No results for input(s): AMMONIA in the last 168 hours. Coagulation Profile: No results for input(s): INR, PROTIME in the last 168 hours. Cardiac Enzymes: No results for input(s): CKTOTAL, CKMB, CKMBINDEX, TROPONINI in the last 168 hours. BNP (last 3 results) No results for input(s): PROBNP in the last 8760 hours. HbA1C: No results for input(s): HGBA1C in the last 72 hours. CBG: No results for input(s): GLUCAP in the last 168 hours. Lipid Profile: No results for input(s): CHOL, HDL, LDLCALC, TRIG, CHOLHDL, LDLDIRECT in the last 72 hours. Thyroid Function Tests: No results for input(s): TSH, T4TOTAL, FREET4, T3FREE, THYROIDAB in the last 72 hours. Anemia Panel: No results for input(s): VITAMINB12, FOLATE, FERRITIN, TIBC, IRON,  RETICCTPCT in the last 72 hours. Sepsis Labs: Recent Labs  Lab 02/03/18 2041 02/03/18 2240 02/04/18 0319  PROCALCITON  --   --  0.57  LATICACIDVEN 3.46* 2.25* 1.2    Recent Results (from the past 240 hour(s))  Blood Culture (routine x 2)     Status: None (Preliminary result)   Collection Time: 02/03/18  8:25 PM  Result Value Ref Range Status   Specimen Description BLOOD RIGHT ANTECUBITAL  Final   Special Requests   Final    BOTTLES DRAWN AEROBIC AND ANAEROBIC Blood Culture adequate volume   Culture   Final    NO GROWTH < 12 HOURS Performed at [facility], [address], [place], [place] [address]    Report Status PENDING  Incomplete  Blood Culture (routine x 2)     Status: None (Preliminary result)   Collection Time: 02/03/18  8:33 PM  Result Value Ref Range Status   Specimen Description BLOOD LEFT ANTECUBITAL  Final   Special Requests   Final    BOTTLES DRAWN AEROBIC AND ANAEROBIC Blood Culture adequate volume   Culture   Final    NO GROWTH < 12 HOURS Performed at [facility]  Lab, [address] [place], [place] [address]    Report Status PENDING  Incomplete         Radiology Studies: Ct Abdomen Pelvis W Contrast  Result Date: 02/03/2018 CLINICAL DATA:  Abd pain, acute, generalized, with fever. 85 year old with abdominal pain, nausea and vomiting. Generalized weakness. EXAM: CT ABDOMEN AND PELVIS WITH CONTRAST TECHNIQUE: Multidetector CT imaging of the abdomen and pelvis was performed using the standard protocol following bolus administration of intravenous contrast. CONTRAST:  13mL OMNIPAQUE IOHEXOL 300 MG/ML  SOLN COMPARISON:  CT 05/27/2017 FINDINGS: Lower chest: Clustered nodular opacities within the lingula, left lower lobe, and to a lesser degree right middle lobe. Aortic atherosclerosis and tortuosity. Heart is normal in size. Hepatobiliary: Postcholecystectomy with chronic pneumobilia and prominent peripheral intrahepatic bile ducts. No focal hepatic  lesion. Pancreas: No ductal dilatation or inflammation. Spleen: Normal in size without focal abnormality. Adrenals/Urinary Tract: Normal adrenal glands. No hydronephrosis or perinephric edema. Homogeneous renal enhancement with symmetric excretion on delayed phase imaging. Tiny bilateral renal cortical hypodensities are too small to characterize but likely small cysts. Urinary bladder is physiologically distended without wall thickening. Stomach/Bowel: Bowel evaluation is limited in the absence of enteric contrast and paucity of intra-abdominal fat. Small hiatal hernia. Fluid-filled stomach with mild mucosal enhancement. Duodenal diverticulum without acute inflammation. Streak artifact from spinal stimulator battery pack in the right lower quadrant partially obscures evaluation of regional bowel loops. Allowing for this, mild small bowel wall enhancement without obstruction. The appendix demonstrate similar enhancement to small bowel without periappendiceal inflammation. There is colonic tortuosity. Descending and sigmoid colon are nondistended, difficult to exclude underlying wall thickening. Mild diverticulosis without evidence diverticulitis. Mild rectal distension. Vascular/Lymphatic: Aortic atherosclerosis and tortuosity. No aneurysm. Portal and mesenteric veins are patent. No acute vascular findings. No definite enlarged abdominal or pelvic lymph nodes. Reproductive: Status post hysterectomy. No adnexal masses. Other: Battery packs from 2 spinal stimulators 1 in the right anterior abdominal wall 1 in the posterior left flank. Leads appear intact where visualized. Small volume of ascites in the abdomen and pelvis. No free air. Musculoskeletal: Scoliosis and multilevel degenerative change throughout the spine. There are no acute or suspicious osseous abnormalities. IMPRESSION: 1. Fluid-filled stomach and small bowel with mild mucosal enhancement, suggesting gastroenteritis. Descending and sigmoid colon are  nondistended, difficult to exclude an element of colonic wall thickening/colitis. 2. Small volume of ascites is likely reactive. 3. Clustered nodular opacities in the lingula, left lower lobe and to a lesser degree right middle lobe, likely infectious. Distribution is atypical for that seen with aspiration. 4.  Aortic Atherosclerosis (ICD10-I70.0). Electronically Signed   By: Keith Rake M.D.   On: 02/03/2018 22:57   Dg Chest Port 1 View  Result Date: 02/03/2018 CLINICAL DATA:  Dyspnea EXAM: PORTABLE CHEST 1 VIEW COMPARISON:  06/29/2015 chest radiograph. FINDINGS: Inferior approach spinal stimulator leads terminate over the lower thoracic spinal canal. Stable cardiomediastinal silhouette with normal heart size. No pneumothorax. No pleural effusion. Lungs appear clear, with no acute consolidative airspace disease and no pulmonary edema. IMPRESSION: No active disease. Electronically Signed   By: Ilona Sorrel M.D.   On: 02/03/2018 20:27        Scheduled Meds: . calcium-vitamin D  1 tablet Oral BID WC  . cholecalciferol  1,000 Units Oral Daily  . denosumab  60 mg Subcutaneous Once  . enoxaparin (LOVENOX) injection  30 mg Subcutaneous Daily  . lactulose  10 g Oral Daily  . mometasone-formoterol  2 puff Inhalation BID  .  multivitamin with minerals  1 tablet Oral Daily  . pantoprazole  40 mg Oral Daily  . saccharomyces boulardii  250 mg Oral BID  . vitamin C  500 mg Oral Daily  . vitamin E  100 Units Oral Daily   Continuous Infusions: . sodium chloride 75 mL/hr at 02/04/18 1119  . ceFEPime (MAXIPIME) IV    . metronidazole 500 mg (02/04/18 1359)  . [START ON 02/05/2018] vancomycin       LOS: 1 day   Time spent= 35 mins    Brecken Walth Arsenio Loader, MD [facility]  If 7PM-7AM, please contact night-coverage [email or web address] 02/04/2018, 2:25 PM

## 2018-02-05 ENCOUNTER — Other Ambulatory Visit: Payer: Self-pay

## 2018-02-05 DIAGNOSIS — J9621 Acute and chronic respiratory failure with hypoxia: Secondary | ICD-10-CM

## 2018-02-05 DIAGNOSIS — A419 Sepsis, unspecified organism: Principal | ICD-10-CM

## 2018-02-05 DIAGNOSIS — J690 Pneumonitis due to inhalation of food and vomit: Secondary | ICD-10-CM

## 2018-02-05 DIAGNOSIS — I5032 Chronic diastolic (congestive) heart failure: Secondary | ICD-10-CM

## 2018-02-05 LAB — BASIC METABOLIC PANEL
Anion gap: 9 (ref 5–15)
BUN: 12 mg/dL (ref 8–23)
CO2: 23 mmol/L (ref 22–32)
Calcium: 6.9 mg/dL — ABNORMAL LOW (ref 8.9–10.3)
Chloride: 109 mmol/L (ref 98–111)
Creatinine, Ser: 0.68 mg/dL (ref 0.44–1.00)
GFR calc Af Amer: >60 mL/min (ref >60)
GFR calc non Af Amer: >60 mL/min (ref >60)
Glucose, Bld: 104 mg/dL — ABNORMAL HIGH (ref 70–99)
Potassium: 3.6 mmol/L (ref 3.5–5.1)
Sodium: 141 mmol/L (ref 135–145)

## 2018-02-05 LAB — GLUCOSE, CAPILLARY: Glucose-Capillary: 82 mg/dL (ref 70–99)

## 2018-02-05 LAB — LEGIONELLA PNEUMOPHILA SEROGP 1 UR AG: L. pneumophila Serogp 1 Ur Ag: NEGATIVE

## 2018-02-05 MED ORDER — SODIUM CHLORIDE 0.9 % IV SOLN: INTRAVENOUS | Status: DC | PRN

## 2018-02-05 MED ORDER — ORAL CARE MOUTH RINSE
15.0000 mL | Freq: Two times a day (BID) | OROMUCOSAL | Status: DC
  Administered 2018-02-05 (×2): 15 mL via OROMUCOSAL

## 2018-02-05 MED ORDER — SODIUM CHLORIDE 0.9 % IV SOLN
3.0000 g | Freq: Three times a day (TID) | INTRAVENOUS | Status: DC
  Administered 2018-02-05 – 2018-02-06 (×2): 3 g via INTRAVENOUS
  Filled 2018-02-05 (×3): qty 3

## 2018-02-05 MED ORDER — SODIUM CHLORIDE 0.9 % IV SOLN
3.0000 g | Freq: Two times a day (BID) | INTRAVENOUS | Status: DC
  Administered 2018-02-05: 3 g via INTRAVENOUS
  Filled 2018-02-05 (×2): qty 3

## 2018-02-05 NOTE — Care Management Important Message (Signed)
Important Message  Patient Details  Name: [name] [name] MRN: [id number] Date of Birth: [birth year]   Medicare Important Message Given:  Yes    Ezriel Boffa [address] 02/05/2018, 3:41 PM

## 2018-02-05 NOTE — Progress Notes (Addendum)
OT Cancellation Note  Patient Details Name: [name] [name] MRN: [id number] DOB: 1925-08-13   Cancelled Treatment:    Reason Eval/Treat Not Completed: Other (comment). Pt stating that she wants to wait until she has had breakfast prior to doing therapy. Per pt and nurse, pt recently cleared for solids. Plan to reattempt later this morning.  Tyrone Schimke, OT Acute Rehabilitation Services Pager: [phone] Office: [phone]  02/05/2018, 10:13 AM    11:42AM: Second attempt. Pt just getting started with PT.

## 2018-02-05 NOTE — Progress Notes (Signed)
PROGRESS NOTE        PATIENT DETAILS Name: [name] [name] Age: 85 y.o. Sex: female Date of Birth: 1925/10/30 Admit Date: 02/03/2018 Admitting Physician Ivor Costa, MD PCP:O'Sullivan, Lenna Sciara, NP  Brief Narrative: Patient is a 85 y.o. female with history of hypertension, COPD with chronic hypoxic respiratory failure on 2 L of oxygen at night, GERD,-presented with nausea, vomiting and shortness of breath.  She is thought to have probable gastroenteritis and aspiration pneumonia.  See below for further details  Subjective: Feels much better.  No chest pain-no shortness of breath.  No nausea or vomiting.  No diarrhea.  Assessment/Plan: Sepsis secondary to gastroenteritis with probable aspiration pneumonia: Sepsis pathophysiology has resolved.  High suspicion for aspiration pneumonia given recurrent vomiting on admission.  Clinically improved with no fever or leukocytosis.  Ultrasound negative.  Stop vancomycin, Flagyl, cefepime-transition to Unasyn.  Suspect if clinical improvement continues-we can transition to Augmentin on discharge.  Acute on chronic hypoxic respiratory failure: Secondary to aspiration pneumonitis-see above-we will asked RN to titrate off oxygen to baseline regimen.  COPD: No evidence of exacerbation-continue bronchodilators  Chronic diastolic heart failure: Euvolemic on exam-stop all IV fluids  GERD: Continue PPI  Debility/deconditioning: Secondary to acute illness-ambulate with physical therapy-may require home health services on discharge.  DVT Prophylaxis: Prophylactic Lovenox   Code Status: Full code   Family Communication: None at bedside  Disposition Plan: Remain inpatient-hopefully home with home health services tomorrow  Antimicrobial agents: Anti-infectives (From admission, onward)   Start     Dose/Rate Route Frequency Ordered Stop   02/05/18 2100  vancomycin (VANCOCIN) IVPB 750 mg/150 ml premix  Status:  Discontinued     750 mg 150 mL/hr over 60 Minutes Intravenous Every 48 hours 02/03/18 2122 02/05/18 0859   02/05/18 1030  Ampicillin-Sulbactam (UNASYN) 3 g in sodium chloride 0.9 % 100 mL IVPB     3 g 200 mL/hr over 30 Minutes Intravenous Every 12 hours 02/05/18 0922     02/04/18 2100  ceFEPIme (MAXIPIME) 1 g in sodium chloride 0.9 % 100 mL IVPB  Status:  Discontinued     1 g 200 mL/hr over 30 Minutes Intravenous Every 24 hours 02/03/18 2122 02/05/18 0859   02/03/18 2100  ceFEPIme (MAXIPIME) 2 g in sodium chloride 0.9 % 100 mL IVPB     2 g 200 mL/hr over 30 Minutes Intravenous  Once 02/03/18 2046 02/03/18 2155   02/03/18 2100  metroNIDAZOLE (FLAGYL) IVPB 500 mg  Status:  Discontinued     500 mg 100 mL/hr over 60 Minutes Intravenous Every 8 hours 02/03/18 2046 02/05/18 0859   02/03/18 2100  vancomycin (VANCOCIN) IVPB 1000 mg/200 mL premix     1,000 mg 200 mL/hr over 60 Minutes Intravenous  Once 02/03/18 2046 02/04/18 0048      Procedures: None  CONSULTS:  None  Time spent: 25- minutes-Greater than 50% of this time was spent in counseling, explanation of diagnosis, planning of further management, and coordination of care.  MEDICATIONS: Scheduled Meds: . calcium-vitamin D  1 tablet Oral BID WC  . cholecalciferol  1,000 Units Oral Daily  . enoxaparin (LOVENOX) injection  30 mg Subcutaneous Daily  . lactulose  10 g Oral Daily  . mouth rinse  15 mL Mouth Rinse BID  . mometasone-formoterol  2 puff Inhalation BID  . multivitamin with minerals  1 tablet  Oral Daily  . pantoprazole  40 mg Oral Daily  . saccharomyces boulardii  250 mg Oral BID  . vitamin C  500 mg Oral Daily  . vitamin E  100 Units Oral Daily   Continuous Infusions: . sodium chloride 10 mL/hr at 02/05/18 0218  . ampicillin-sulbactam (UNASYN) IV     PRN Meds:.sodium chloride, acetaminophen, butalbital-acetaminophen-caffeine, dextromethorphan-guaiFENesin, diclofenac sodium, ipratropium-albuterol, nitroGLYCERIN, ondansetron (ZOFRAN)  IV, oxyCODONE-acetaminophen, zolpidem   PHYSICAL EXAM: Vital signs: Vitals:   02/05/18 0408 02/05/18 0548 02/05/18 0552 02/05/18 0733  BP:   (!) 150/71   Pulse:      Resp:   16   Temp: (!) 97.4 F (36.3 C) 98 F (36.7 C)    TempSrc: Axillary Oral    SpO2:   98% 96%  Weight:      Height:       Filed Weights   02/03/18 1921  Weight: 44.5 kg   Body mass index is 17.36 kg/m.   General appearance :Awake, alert, not in any distress.  Eyes:.Pink conjunctiva HEENT: Atraumatic and Normocephalic Neck: supple Resp:Good air entry bilaterally, no added sounds  CVS: S1 S2 regular GI: Bowel sounds present, Non tender and not distended with no gaurding, rigidity or rebound.No organomegaly Extremities: B/L Lower Ext shows no edema, both legs are warm to touch Neurology:  speech clear,Non focal, sensation is grossly intact. Musculoskeletal:No digital cyanosis Skin:No Rash, warm and dry Wounds:N/A  I have personally reviewed following labs and imaging studies  LABORATORY DATA: CBC: Recent Labs  Lab 02/03/18 2027  WBC 8.9  NEUTROABS 7.8*  HGB 17.4*  HCT 56.1*  MCV 95.1  PLT 882    Basic Metabolic Panel: Recent Labs  Lab 02/03/18 2027  NA 138  K 4.8  CL 96*  CO2 26  GLUCOSE 179*  BUN 23  CREATININE 1.22*  CALCIUM 9.1    GFR: Estimated Creatinine Clearance: 21.5 mL/min (A) (by C-G formula based on SCr of 1.22 mg/dL (H)).  Liver Function Tests: Recent Labs  Lab 02/03/18 2027  AST 27  ALT 16  ALKPHOS 92  BILITOT 0.6  PROT 7.2  ALBUMIN 3.7   Recent Labs  Lab 02/03/18 2027  LIPASE 25   No results for input(s): AMMONIA in the last 168 hours.  Coagulation Profile: No results for input(s): INR, PROTIME in the last 168 hours.  Cardiac Enzymes: No results for input(s): CKTOTAL, CKMB, CKMBINDEX, TROPONINI in the last 168 hours.  BNP (last 3 results) No results for input(s): PROBNP in the last 8760 hours.  HbA1C: No results for input(s): HGBA1C in the  last 72 hours.  CBG: No results for input(s): GLUCAP in the last 168 hours.  Lipid Profile: No results for input(s): CHOL, HDL, LDLCALC, TRIG, CHOLHDL, LDLDIRECT in the last 72 hours.  Thyroid Function Tests: No results for input(s): TSH, T4TOTAL, FREET4, T3FREE, THYROIDAB in the last 72 hours.  Anemia Panel: No results for input(s): VITAMINB12, FOLATE, FERRITIN, TIBC, IRON, RETICCTPCT in the last 72 hours.  Urine analysis:    Component Value Date/Time   COLORURINE YELLOW 02/04/2018 0559   APPEARANCEUR CLEAR 02/04/2018 0559   LABSPEC 1.038 (H) 02/04/2018 0559   PHURINE 6.0 02/04/2018 0559   GLUCOSEU NEGATIVE 02/04/2018 0559   HGBUR NEGATIVE 02/04/2018 0559   BILIRUBINUR NEGATIVE 02/04/2018 0559   BILIRUBINUR neg 04/27/2017 1349   KETONESUR 5 (A) 02/04/2018 0559   PROTEINUR 30 (A) 02/04/2018 0559   UROBILINOGEN 0.2 04/27/2017 1349   UROBILINOGEN 0.2 11/09/2014 0437  NITRITE NEGATIVE 02/04/2018 0559   LEUKOCYTESUR NEGATIVE 02/04/2018 0559    Sepsis Labs: Lactic Acid, Venous    Component Value Date/Time   LATICACIDVEN 1.2 02/04/2018 0319    MICROBIOLOGY: Recent Results (from the past 240 hour(s))  Blood Culture (routine x 2)     Status: None (Preliminary result)   Collection Time: 02/03/18  8:25 PM  Result Value Ref Range Status   Specimen Description BLOOD RIGHT ANTECUBITAL  Final   Special Requests   Final    BOTTLES DRAWN AEROBIC AND ANAEROBIC Blood Culture adequate volume   Culture   Final    NO GROWTH 2 DAYS Performed at [facility], [address] [place], [place] [address]    Report Status PENDING  Incomplete  Blood Culture (routine x 2)     Status: None (Preliminary result)   Collection Time: 02/03/18  8:33 PM  Result Value Ref Range Status   Specimen Description BLOOD LEFT ANTECUBITAL  Final   Special Requests   Final    BOTTLES DRAWN AEROBIC AND ANAEROBIC Blood Culture adequate volume   Culture   Final    NO GROWTH 2 DAYS Performed at [facility], [place] [address] [place], [place] [address]    Report Status PENDING  Incomplete  MRSA PCR Screening     Status: None   Collection Time: 02/04/18  3:30 PM  Result Value Ref Range Status   MRSA by PCR NEGATIVE NEGATIVE Final    Comment:        The GeneXpert MRSA Assay (FDA approved for NASAL specimens only), is one component of a comprehensive MRSA colonization surveillance program. It is not intended to diagnose MRSA infection nor to guide or monitor treatment for MRSA infections. Performed at [facility], [place] [address], [place], [place] [address]     RADIOLOGY STUDIES/RESULTS: Ct Abdomen Pelvis W Contrast  Result Date: 02/03/2018 CLINICAL DATA:  Abd pain, acute, generalized, with fever. 85 year old with abdominal pain, nausea and vomiting. Generalized weakness. EXAM: CT ABDOMEN AND PELVIS WITH CONTRAST TECHNIQUE: Multidetector CT imaging of the abdomen and pelvis was performed using the standard protocol following bolus administration of intravenous contrast. CONTRAST:  6mL OMNIPAQUE IOHEXOL 300 MG/ML  SOLN COMPARISON:  CT 05/27/2017 FINDINGS: Lower chest: Clustered nodular opacities within the lingula, left lower lobe, and to a lesser degree right middle lobe. Aortic atherosclerosis and tortuosity. Heart is normal in size. Hepatobiliary: Postcholecystectomy with chronic pneumobilia and prominent peripheral intrahepatic bile ducts. No focal hepatic lesion. Pancreas: No ductal dilatation or inflammation. Spleen: Normal in size without focal abnormality. Adrenals/Urinary Tract: Normal adrenal glands. No hydronephrosis or perinephric edema. Homogeneous renal enhancement with symmetric excretion on delayed phase imaging. Tiny bilateral renal cortical hypodensities are too small to characterize but likely small cysts. Urinary bladder is physiologically distended without wall thickening. Stomach/Bowel: Bowel evaluation is limited in the absence of enteric contrast and paucity  of intra-abdominal fat. Small hiatal hernia. Fluid-filled stomach with mild mucosal enhancement. Duodenal diverticulum without acute inflammation. Streak artifact from spinal stimulator battery pack in the right lower quadrant partially obscures evaluation of regional bowel loops. Allowing for this, mild small bowel wall enhancement without obstruction. The appendix demonstrate similar enhancement to small bowel without periappendiceal inflammation. There is colonic tortuosity. Descending and sigmoid colon are nondistended, difficult to exclude underlying wall thickening. Mild diverticulosis without evidence diverticulitis. Mild rectal distension. Vascular/Lymphatic: Aortic atherosclerosis and tortuosity. No aneurysm. Portal and mesenteric veins are patent. No acute vascular findings.  No definite enlarged abdominal or pelvic lymph nodes. Reproductive: Status post hysterectomy. No adnexal masses. Other: Battery packs from 2 spinal stimulators 1 in the right anterior abdominal wall 1 in the posterior left flank. Leads appear intact where visualized. Small volume of ascites in the abdomen and pelvis. No free air. Musculoskeletal: Scoliosis and multilevel degenerative change throughout the spine. There are no acute or suspicious osseous abnormalities. IMPRESSION: 1. Fluid-filled stomach and small bowel with mild mucosal enhancement, suggesting gastroenteritis. Descending and sigmoid colon are nondistended, difficult to exclude an element of colonic wall thickening/colitis. 2. Small volume of ascites is likely reactive. 3. Clustered nodular opacities in the lingula, left lower lobe and to a lesser degree right middle lobe, likely infectious. Distribution is atypical for that seen with aspiration. 4.  Aortic Atherosclerosis (ICD10-I70.0). Electronically Signed   By: Keith Rake M.D.   On: 02/03/2018 22:57   Dg Chest Port 1 View  Result Date: 02/03/2018 CLINICAL DATA:  Dyspnea EXAM: PORTABLE CHEST 1 VIEW  COMPARISON:  06/29/2015 chest radiograph. FINDINGS: Inferior approach spinal stimulator leads terminate over the lower thoracic spinal canal. Stable cardiomediastinal silhouette with normal heart size. No pneumothorax. No pleural effusion. Lungs appear clear, with no acute consolidative airspace disease and no pulmonary edema. IMPRESSION: No active disease. Electronically Signed   By: Ilona Sorrel M.D.   On: 02/03/2018 20:27     LOS: 2 days   Oren Binet, MD  [facility]  If 7PM-7AM, please contact night-coverage  Please page via [email or web address] [email or web address]-click on MD name and type text message  02/05/2018, 10:23 AM

## 2018-02-05 NOTE — Evaluation (Signed)
Clinical/Bedside Swallow Evaluation Patient Details  Name: [name] [name] MRN: [id number] Date of Birth: April 06, 1925  Today's Date: 02/05/2018 Time: SLP Start Time (ACUTE ONLY): 0930 SLP Stop Time (ACUTE ONLY): 0950 SLP Time Calculation (min) (ACUTE ONLY): 20 min  Past Medical History:  Past Medical History:  Diagnosis Date  . Abdominal pain    Resolved  . Cardiac arrhythmia   . CHF (congestive heart failure) ([place])    Diastolic  on ECHO 1941  . Choledocholithiasis   . Chronic anemia   . Chronic back pain   . COPD (chronic obstructive pulmonary disease) (HCC)    oxygen dependent at  night 2LPM  . Daily headache   . GERD (gastroesophageal reflux disease)   . HCAP (healthcare-associated pneumonia) 03/28/2014  . Hiatal hernia   . Hyperlipidemia   . Hypertension   . On home oxygen therapy    "1L during the day; 2L q hs" (03/28/2014)  . Osteoarthritis    "pretty much q where"   . Osteoporosis   . Pancolitis (HCC)    Infectious vs. inflammatory  . Pancreatitis    History of  . Pneumonia 1930's; 2011 X 2; 02/2014  . PONV (postoperative nausea and vomiting)   . PUD (peptic ulcer disease)   . Reactive airway disease that is not asthma   . Recurrent UTI (urinary tract infection)    "here lately" (03/28/2014)  . Scoliosis deformity of spine    history of intractable back pain   Past Surgical History:  Past Surgical History:  Procedure Laterality Date  . ABDOMINAL HYSTERECTOMY  1970s  . BACK SURGERY     x 5  . BREAST CYST EXCISION Right 1960's  . CERVICAL LAMINECTOMY  1970s  . CHOLECYSTECTOMY  2008  . ERCP W/ SPHICTEROTOMY  02/2010  . ESOPHAGOGASTRODUODENOSCOPY N/A 11/14/2013   Procedure: ESOPHAGOGASTRODUODENOSCOPY (EGD);  Surgeon: Missy Sabins, MD;  [facility];  Service: Endoscopy;  Laterality: N/A;  . FLEXIBLE SIGMOIDOSCOPY N/A 01/24/2013   Procedure: FLEXIBLE SIGMOIDOSCOPY;  Surgeon: Missy Sabins, MD;  Location: [facility];  Service: Endoscopy;  Laterality: N/A;   . PAIN PUMP IMPLANTATION  ~ 2010   back, dilaudid and bupravacaine  . SPINAL CORD STIMULATOR IMPLANT  ~ 2009  . SPINE SURGERY  1970's,1995,[id number]   HPI:  85 year old with history of essential hypertension, COPD on 2 L nasal cannula at night, GERD, CHF, pancreatitis, CKD stage II came to the hospital with complaints of nausea, vomiting and shortness of breath.  It started about 1 day prior to the admission. CT abdomen 02/03/18: Clustered nodular opacities in the lingula, left lower lobe andto a lesser degree right middle lobe, likely infectious. Distribution is atypical for that seen with aspiration per radiologist.    Assessment / Plan / Recommendation Clinical Impression  Pt presents with functional oropharyngeal swallow with adequate attention to and mastication of solids.  Consumed three oz of water with no overt s/s of aspiration.  No focal deficits excluding mild deviation of tongue to right.  RR was 18, well within range that allows adequate synchrony of swallowing/respiration.  No dysphagia identified.  No complaints of difficulty. Continue regular diet, thin liquids.  Our service will sign off.  SLP Visit Diagnosis: Dysphagia, unspecified (R13.10)    Aspiration Risk  No limitations    Diet Recommendation   regular solids, thin liquids  Medication Administration: Whole meds with liquid    Other  Recommendations Oral Care Recommendations: Oral care BID   Follow up Recommendations None  Frequency and Duration            Prognosis        Swallow Study   General HPI: 85 year old with history of essential hypertension, COPD on 2 L nasal cannula at night, GERD, CHF, pancreatitis, CKD stage II came to the hospital with complaints of nausea, vomiting and shortness of breath.  It started about 1 day prior to the admission. CT abdomen 02/03/18: Clustered nodular opacities in the lingula, left lower lobe andto a lesser degree right middle lobe, likely infectious. Distribution is  atypical for that seen with aspiration per radiologist.  Type of Study: Bedside Swallow Evaluation Previous Swallow Assessment: multiple clinical swallow evals during previous admissions with dx of functional swallow Diet Prior to this Study: Regular;Thin liquids Temperature Spikes Noted: No Respiratory Status: Nasal cannula History of Recent Intubation: No Behavior/Cognition: Alert;Cooperative Oral Cavity Assessment: Within Functional Limits Oral Care Completed by SLP: No Oral Cavity - Dentition: Dentures, top;Dentures, bottom Vision: Functional for self-feeding Self-Feeding Abilities: Able to feed self Patient Positioning: Upright in bed Baseline Vocal Quality: Normal Volitional Cough: Strong Volitional Swallow: Able to elicit    Oral/Motor/Sensory Function Overall Oral Motor/Sensory Function: Within functional limits   Ice Chips Ice chips: Within functional limits   Thin Liquid Thin Liquid: Within functional limits    Nectar Thick Nectar Thick Liquid: Not tested   Honey Thick Honey Thick Liquid: Not tested   Puree Puree: Within functional limits   Solid     Solid: Within functional limits      Juan Quam Laurice 02/05/2018,11:27 AM  Estill Bamberg L. Tivis Ringer, [place] Office number [phone] Pager [phone]

## 2018-02-05 NOTE — Progress Notes (Signed)
Initial Nutrition Assessment  DOCUMENTATION CODES:   Severe malnutrition in context of chronic illness, Underweight  INTERVENTION:  Liberalized pt diet from [facility] to Regular. Ordered magic cup at meals.  Reinforced need for adequate intake.   NUTRITION DIAGNOSIS:   Severe Malnutrition related to chronic illness as evidenced by energy intake < or equal to 75% for > or equal to 1 month, severe muscle depletion.   GOAL:   Patient will meet greater than or equal to 90% of their needs, Weight gain    MONITOR:   PO intake, Supplement acceptance  REASON FOR ASSESSMENT:   Consult Assessment of nutrition requirement/status, Diet education  ASSESSMENT:   PT with PMH of HTN, pancreatitis, COPD with 2L of O2 at night, GERD, CHF, CKD Stage 2; brought from home to ED d/t N/V, abdominal pain, and SOB. Pt admitted 1/08 with sepsis.    Pt recently cleared for solids, was waiting on breakfast meal tray to arrive. Pt reports that she lives alone and makes her meals at home. She is able to complete all ADL without difficulty and reports being very active. No N/V 1/08, or swallowing/chewing difficulties.   PT reports never being a large person, UBW ~99lbs, no weight changes that she has noticed in the past.  She eats 2.5 meals a day, starting ~ 9am with breakfast. Breakfast can include cereal with milk and a piece of fruit such as a banana or orange. Lunch is often a whole sandwich such as peanut butter and banana with Coke or water. Pt reports that drinking milk upsets her stomach, but with cereal it is okay. Dinner usually includes vegetables (green beans, squash, or creamed potatoes) and occasionally includes a protein choice (chicken, hamburgers). She does enjoy sweets such as ice cream.  Pt is open to include magic cup with meals to help increase intake.    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 Yes; Fluid consistency: Thin  Diet effective now              EDUCATION NEEDS:   Education needs have been addressed  Skin:  Skin Assessment: Reviewed RN Assessment  Last BM:  Unknown  Height:   Ht Readings from Last 1 Encounters:  02/03/18 5\' 3"  (1.6 m)    Weight:   Wt Readings from Last 1 Encounters:  02/03/18 44.5 kg    Ideal Body Weight:  52.27 kg  BMI:  Body mass index is 17.36 kg/m.  Estimated Nutritional Needs:   Kcal:  1200-1350 (27-30kcal/kg)  Protein:  60-70g (1.3-1.6g/kg)  Fluid:  1.2-1.35L  Brent Intern

## 2018-02-05 NOTE — Consult Note (Signed)
            [facility] CM Primary Care Navigator  02/05/2018  [name] [name] [birth year] [id number]   Seen patient and daughter [name] [name].  Patient reports having "terrible nausea and vomiting" with abdominal discomfort and shortness of breath that had led tothis admission. (Sepsis secondary to gastroenteritis with probable aspiration pneumonia, acute on chronic hypoxic respiratory failure secondary to aspiration pneumonitis- requiring oxygen use, debility/ deconditioning)   PatientendorsesMelissa Inda [name] with [facility] [employer] as her primary care provider.   [facility] [place] [facility] difficulty.  Patientreportsthather daughter has beenmanaging her medicationsat home with use of "pill box" system filled once a week.  Patient's daughter or sons [name] [name] or [name] [name]) have been providing transportationto herdoctors'appointments.  Patientstates living alone but son- [name] [name] lives next door from her, and other son- [name] [name] works closeby her. Her daughter serves as the primary caregiver for her and sons assist when needed.  Anticipated plan for discharge is homewith home health services per therapy recommendation.  Patientand daughter voicedunderstanding to call primary care provider's office when discharge to homefor a post discharge follow-up appointment within 1- 2 weeksor sooner if [name] arise. Patient letter (with PCP's contact number) was provided asa reminder.   Explained to patient and daughterregardingTHN CM services available for health managementand resourcesat home and had indicated having no pressing issues or [name] at this point. Patient however, verbally agreed and optedforEMMI Pneumonia calls tofollow-up with herrecovery at home.  Referralmade forEMMI Pneumonia Calls after discharge.  Patient and daughter  verbalizedunderstanding to seek referral to [facility] care managementfrom primary care provider ifdeemed necessary and appropriate forfurtherservices in thenear future.   [facility] care management information provided for future [name] that may arise.  Primary care provider's office is listed as providing transition of care (TOC) follow-up.    For additional questions please contact:  Edwena Felty A. Aldina Porta, BSN, RN-BC [facility] PRIMARY CARE Navigator Cell: [phone]

## 2018-02-05 NOTE — Evaluation (Signed)
Physical Therapy Evaluation Patient Details Name: [name] [name] MRN: [id number] DOB: [birth year] Today's Date: 02/05/2018   History of Present Illness  85 year old with history of essential hypertension, COPD on 2 L nasal cannula at night, GERD, CHF, pancreatitis, CKD stage II came to the hospital complains of nausea, vomiting and shortness of breath.  Dx of likely aspiration pneumonia, possible colitis.  Clinical Impression  Pt admitted with above diagnosis. Pt currently with functional limitations due to the deficits listed below (see PT Problem List). Pt ambulated 43' with RW, no loss of balance, distance limited by fatigue, HR 104, SaO2 98% on room air.  Pt will benefit from skilled PT to increase their independence and safety with mobility to allow discharge to the venue listed below.       Follow Up Recommendations Home health PT    Equipment Recommendations  None recommended by PT    Recommendations for Other Services       Precautions / Restrictions Precautions Precautions: Fall Precaution Comments: pt denies h/o falls in past 1 year Restrictions Weight Bearing Restrictions: No      Mobility  Bed Mobility Overal bed mobility: Modified Independent             General bed mobility comments: HOB up 30*  Transfers Overall transfer level: Needs assistance Equipment used: Rolling walker (2 wheeled) Transfers: Sit to/from Stand Sit to Stand: Supervision         General transfer comment: supervision for safety, no loss of balance  Ambulation/Gait Ambulation/Gait assistance: Supervision Gait Distance (Feet): 80 Feet Assistive device: Rolling walker (2 wheeled) Gait Pattern/deviations: Step-through pattern;Decreased stride length Gait velocity: decr   General Gait Details: steady with RW, HR 104, no loss of balance, SaO2 98% on room air  Stairs            Wheelchair Mobility    Modified Rankin (Stroke Patients Only)       Balance Overall balance  assessment: Modified Independent                                           Pertinent Vitals/Pain Pain Assessment: Faces Faces Pain Scale: Hurts even more Pain Location: headache Pain Descriptors / Indicators: Aching Pain Intervention(s): Limited activity within patient's tolerance;Monitored during session;Patient requesting pain meds-RN notified    Home Living Family/patient expects to be discharged to:: Private residence Living Arrangements: Alone Available Help at Discharge: Family;Available PRN/intermittently Type of Home: House Home Access: Stairs to enter Entrance Stairs-Rails: Right;Left;Can reach both Entrance Stairs-Number of Steps: 2 Home Layout: One level Home Equipment: Walker - 2 wheels;Cane - single point;Bedside commode Additional Comments: O2 at night; son lives next door, daughter provides transportation    Prior Function Level of Independence: Independent with assistive device(s)         Comments: uses cane when going out of home, no AD in home, denies falls in past 1 year, independent ADLs     Hand Dominance        Extremity/Trunk Assessment   Upper Extremity Assessment Upper Extremity Assessment: Overall WFL for tasks assessed    Lower Extremity Assessment Lower Extremity Assessment: Overall WFL for tasks assessed    Cervical / Trunk Assessment Cervical / Trunk Assessment: Normal  Communication   Communication: No difficulties  Cognition Arousal/Alertness: Awake/alert Behavior During Therapy: WFL for tasks assessed/performed Overall Cognitive Status: Within Functional Limits for tasks assessed  General Comments      Exercises     Assessment/Plan    PT Assessment Patient needs continued PT services  PT Problem List Decreased activity tolerance;Decreased mobility       PT Treatment Interventions Gait training;DME instruction;Therapeutic activities;Therapeutic  exercise;Patient/family education    PT Goals (Current goals can be found in the Care Plan section)  Acute Rehab PT Goals Patient Stated Goal: return home PT Goal Formulation: With patient Time For Goal Achievement: 02/19/18 Potential to Achieve Goals: Good    Frequency Min 3X/week   Barriers to discharge        Co-evaluation               AM-PAC PT "6 Clicks" Mobility  Outcome Measure Help needed turning from your back to your side while in a flat bed without using bedrails?: None Help needed moving from lying on your back to sitting on the side of a flat bed without using bedrails?: A Little Help needed moving to and from a bed to a chair (including a wheelchair)?: A Little Help needed standing up from a chair using your arms (e.g., wheelchair or bedside chair)?: A Little Help needed to walk in hospital room?: A Little Help needed climbing 3-5 steps with a railing? : A Little 6 Click Score: 19    End of Session Equipment Utilized During Treatment: Gait belt Activity Tolerance: Patient tolerated treatment well Patient left: in chair;with call bell/phone within reach Nurse Communication: Mobility status PT Visit Diagnosis: Difficulty in walking, not elsewhere classified (R26.2)    Time: 7106-2694 PT Time Calculation (min) (ACUTE ONLY): 22 min   Charges:   PT Evaluation $PT Eval Low Complexity: 1 Low          Philomena Doheny PT 02/05/2018  Acute Rehabilitation Services Pager [phone] Office [phone]

## 2018-02-05 NOTE — Progress Notes (Addendum)
Pharmacy Antibiotic Note  [name] [name] is a 85 y.o. female admitted on 02/03/2018 with sepsis.   Vancomycin, cefepime and flagyl discontinued today and pharmacy consulted to start Unasyn for aspiration PNA.  Tmax  99.4, WBC WNL, scr came back <1, CrCl~32. Will change Unasyn dose today.    Plan: Unasyn 3 g IV q8h  Monitor clinical status, renal function and culture results daily.    Height: 5\' 3"  (160 cm) Weight: 98 lb (44.5 kg) IBW/kg (Calculated) : 52.4  Temp (24hrs), Avg:98.3 F (36.8 C), Min:97.4 F (36.3 C), Max:99.4 F (37.4 C)  Recent Labs  Lab 02/03/18 2027 02/03/18 2041 02/03/18 2240 02/04/18 0319  WBC 8.9  --   --   --   CREATININE 1.22*  --   --   --   LATICACIDVEN  --  3.46* 2.25* 1.2    Estimated Creatinine Clearance: 21.5 mL/min (A) (by C-G formula based on SCr of 1.22 mg/dL (H)).    Allergies  Allergen Reactions  . Cefdinir Diarrhea  . Codeine Diarrhea and Nausea And Vomiting    REACTION: n/v/d, HA  . Influenza Vaccines     Stomach pain, headache  . Morphine Diarrhea and Nausea And Vomiting    REACTION: n/v/d, HA  . Sulfa Antibiotics Nausea Only    Sick    . Zoledronic Acid Swelling    REACTION: Severe edema    Unasyn 1/10>> Vanc 1/8>>1/10 Cefepime 1/8>>1/10 Flagyl 1/8>>1/10  1/8 blood x 2 ngtd 1/9 MRSA PCR neg  Onnie Boer, PharmD, BCIDP, AAHIVP, CPP Infectious Disease Pharmacist 02/05/2018 3:08 PM

## 2018-02-06 DIAGNOSIS — E43 Unspecified severe protein-calorie malnutrition: Secondary | ICD-10-CM

## 2018-02-06 DIAGNOSIS — E46 Unspecified protein-calorie malnutrition: Secondary | ICD-10-CM

## 2018-02-06 MED ORDER — AMOXICILLIN-POT CLAVULANATE 875-125 MG PO TABS: 1.0000 | ORAL_TABLET | Freq: Two times a day (BID) | ORAL | 0 refills | Status: DC

## 2018-02-06 NOTE — Evaluation (Signed)
Occupational Therapy Evaluation Patient Details Name: [name] [name] MRN: [id number] DOB: 08-13-1925 Today's Date: 02/06/2018    History of Present Illness 85 year old with history of essential hypertension, COPD on 2 L nasal cannula at night, GERD, CHF, pancreatitis, CKD stage II came to the hospital complains of nausea, vomiting and shortness of breath.  Dx of likely aspiration pneumonia, possible colitis.   Clinical Impression   PTA patient independent. Admitted for above and limited by generalized weakness.  Patient demonstrates ability to complete full body dressing with independence, transfers/mobility initially with RW then fading to no AD with supervision, and grooming/toileting with supervision.  Discussed fall prevention and home safety, return to light IADLs and energy conservation.  Patient will have 24/7 support initially and has great family support.  At this time, no further OT needs have been identified and OT signing off.     Follow Up Recommendations  No OT follow up;Supervision/Assistance - 24 hour(24/7 initally, fading to as needed)    Equipment Recommendations  None recommended by OT    Recommendations for Other Services       Precautions / Restrictions Precautions Precautions: Fall Precaution Comments: pt denies h/o falls in past 1 year Restrictions Weight Bearing Restrictions: No      Mobility Bed Mobility               General bed mobility comments: OOB upon entry  Transfers Overall transfer level: Needs assistance Equipment used: Rolling walker (2 wheeled);None Transfers: Sit to/from Stand Sit to Stand: Supervision         General transfer comment: supervision for safety, initally using RW but fading to no AD     Balance Overall balance assessment: No apparent balance deficits (not formally assessed)                                         ADL either performed or assessed with clinical judgement   ADL                                          General ADL Comments: patient demonstrates mobility, toilet transfers, toileting and grooming at supervision level for safety; initally using RW but reports "plan to not use walker" at home and therefore completed session without RW given supervision; reports dressing this morning without assist      Vision   Vision Assessment?: No apparent visual deficits     Perception     Praxis      Pertinent Vitals/Pain Pain Assessment: Faces Faces Pain Scale: No hurt     Hand Dominance Left   Extremity/Trunk Assessment Upper Extremity Assessment Upper Extremity Assessment: Overall WFL for tasks assessed   Lower Extremity Assessment Lower Extremity Assessment: Defer to PT evaluation       Communication Communication Communication: No difficulties   Cognition Arousal/Alertness: Awake/alert Behavior During Therapy: WFL for tasks assessed/performed Overall Cognitive Status: Within Functional Limits for tasks assessed                                     General Comments  family present and supportive; reviewed fall prevention and home safety     Exercises     Shoulder Instructions  Home Living Family/patient expects to be discharged to:: Private residence Living Arrangements: Alone Available Help at Discharge: Family;Available PRN/intermittently Type of Home: House Home Access: Stairs to enter [employer] of Steps: 2 Entrance Stairs-Rails: Right;Left;Can reach both Home Layout: One level     Bathroom Shower/Tub: Occupational psychologist: Handicapped height     Home Equipment: Environmental consultant - 2 wheels;Cane - single point;Bedside commode   Additional Comments: O2 at night; son lives next door, daughter provides transportation; reports she will have 24/7 initally       Prior Functioning/Environment Level of Independence: Independent with assistive device(s)        Comments: uses cane when  going out of home, no AD in home, denies falls in past 1 year, independent ADLs        OT Problem List: Decreased strength;Decreased activity tolerance      OT Treatment/Interventions:      OT Goals(Current goals can be found in the care plan section) Acute Rehab OT Goals Patient Stated Goal: return home OT Goal Formulation: With patient  OT Frequency:     Barriers to D/C:            Co-evaluation              AM-PAC OT "6 Clicks" Daily Activity     Outcome Measure Help from another person eating meals?: None Help from another person taking care of personal grooming?: None Help from another person toileting, which includes using toliet, bedpan, or urinal?: None Help from another person bathing (including washing, rinsing, drying)?: None Help from another person to put on and taking off regular upper body clothing?: None Help from another person to put on and taking off regular lower body clothing?: None 6 Click Score: 24   End of Session Equipment Utilized During Treatment: Rolling walker Nurse Communication: Mobility status  Activity Tolerance: Patient tolerated treatment well Patient left: in chair;with family/visitor present;with nursing/sitter in room  OT Visit Diagnosis: Muscle weakness (generalized) (M62.81)                Time: 0737-1062 OT Time Calculation (min): 13 min Charges:  OT General Charges $OT Visit: 1 Visit OT Evaluation $OT Eval Low Complexity: 1 Low  Delight Stare, OT Acute Rehabilitation Services Pager [phone] Office [phone]   Delight Stare 02/06/2018, 10:52 AM

## 2018-02-06 NOTE — Care Management Note (Signed)
Case Management Note  Patient Details  Name: [name] [name] MRN: [id number] Date of Birth: 07-Sep-1925  Subjective/Objective:                    Action/Plan:  Spoke w patient and Granddaughter at bedside. They have used [facility] in the past and would like to use them again. Referral placed to [facility]. No DME needs. No other CM needs.   Expected Discharge Date:  02/06/18               Expected Discharge Plan:  [place]  In-House Referral:     Discharge planning Services  CM Consult  Post Acute Care Choice:  Home Health Choice offered to:  Patient, Adult Children  DME Arranged:    DME Agency:     HH Arranged:  PT [place]:  [place]  Status of Service:  Completed, signed off  If discussed at [place] of Stay Meetings, dates discussed:    Additional Comments:  Carles Collet, RN 02/06/2018, 9:22 AM

## 2018-02-06 NOTE — Discharge Summary (Signed)
PATIENT DETAILS Name: [name] [name] Age: 85 y.o. Sex: female Date of Birth: 1926/01/02 MRN: [id number]. Admitting Physician: Ivor Costa, MD PCP:O'Sullivan, Lenna Sciara, NP  Admit Date: 02/03/2018 Discharge date: 02/06/2018  Recommendations for Outpatient Follow-up:  1. Follow up with PCP in 1-2 weeks 2. Please obtain BMP/CBC in one week  Admitted From:  Home  Disposition: Home with home health services   Amenia: Yes  Equipment/Devices: None  Discharge Condition: Stable  CODE STATUS: FULL CODE  Diet recommendation:  Heart Healthy   Brief Summary: See H&P, Labs, Consult and Test reports for all details in brief, Patient is a 85 y.o. female with history of hypertension, COPD with chronic hypoxic respiratory failure on 2 L of oxygen at night, GERD,-presented with nausea, vomiting and shortness of breath.  She is thought to have probable gastroenteritis and aspiration pneumonia.  See below for further details  Brief Hospital Course: Sepsis secondary to gastroenteritis with probable aspiration pneumonia: Sepsis pathophysiology has resolved.  High suspicion for aspiration pneumonia given recurrent vomiting on admission.  Clinically improved with no fever or leukocytosis.    All cultures negative-briefly on vancomycin, cefepime and Flagyl-subsequently transition to Unasyn-we will discharge on a few more days of Augmentin.    Acute on chronic hypoxic respiratory failure (on home O2): Secondary to aspiration pneumonitis-see above-back on usual regimen of 2 L of oxygen.    COPD: No evidence of exacerbation-continue bronchodilators  Chronic diastolic heart failure: Euvolemic on exam  GERD: Continue PPI  Chronic pain syndrome scoliosis/chronic back pain-has a IT pump in place-followed by pain clinic in [place].  Debility/deconditioning: Secondary to acute illness-evaluated by physical therapy-have ordered home health services on discharge.  Severe protein calorie  malnutrition  Procedures/Studies: None  Discharge Diagnoses:  Principal Problem:   Sepsis ([place]) Active Problems:   Essential hypertension   Chronic diastolic CHF (congestive heart failure) (HCC)   GERD   Acute on chronic respiratory failure with hypoxia (HCC)   COPD (chronic obstructive pulmonary disease) (HCC)   Aspiration pneumonia (HCC)   Colitis   CKD (chronic kidney disease), stage II   Nausea & vomiting   Abdominal pain   Protein-calorie malnutrition, severe   Discharge Instructions:  Activity:  As tolerated with Full fall precautions use walker/cane & assistance as needed   Discharge Instructions    Call MD for:  difficulty breathing, headache or visual disturbances   Complete by:  As directed    Diet - low sodium heart healthy   Complete by:  As directed    Discharge instructions   Complete by:  As directed    Follow with Primary care practitioner-O'Sullivan, [name], NP in 1 week  Please get a complete blood count and chemistry panel checked by your Primary MD at your next visit, and again as instructed by your Primary MD.  Get Medicines reviewed and adjusted: Please take all your medications with you for your next visit with your Primary MD  Laboratory/radiological data: Please request your Primary MD to go over all hospital tests and procedure/radiological results at the follow up, please ask your Primary MD to get all Hospital records sent to his/her office.  In some cases, they will be blood work, cultures and biopsy results pending at the time of your discharge. Please request that your primary care M.D. follows up on these results.  Also Note the following: If you experience worsening of your admission symptoms, develop shortness of breath, life threatening emergency, suicidal or homicidal thoughts you must seek medical  attention immediately by calling 911 or calling your MD immediately  if symptoms less severe.  You must read complete  instructions/literature along with all the possible adverse reactions/side effects for all the Medicines you take and that have been prescribed to you. Take any new Medicines after you have completely understood and accpet all the possible adverse reactions/side effects.   Do not drive when taking Pain medications or sleeping medications (Benzodaizepines)  Do not take more than prescribed Pain, Sleep and Anxiety Medications. It is not advisable to combine anxiety,sleep and pain medications without talking with your primary care practitioner  Special Instructions: If you have smoked or chewed Tobacco  in the last 2 yrs please stop smoking, stop any regular Alcohol  and or any Recreational drug use.  Wear Seat belts while driving.  Please note: You were cared for by a hospitalist during your hospital stay. Once you are discharged, your primary care physician will handle any further medical issues. Please note that NO REFILLS for any discharge medications will be authorized once you are discharged, as it is imperative that you return to your primary care physician (or establish a relationship with a primary care physician if you do not have one) for your post hospital discharge needs so that they can reassess your need for medications and monitor your lab values.   Increase activity slowly   Complete by:  As directed      Allergies as of 02/06/2018      Reactions   Cefdinir Diarrhea   Codeine Diarrhea, Nausea And Vomiting   REACTION: n/v/d, HA   Influenza Vaccines    Stomach pain, headache   Morphine Diarrhea, Nausea And Vomiting   REACTION: n/v/d, HA   Sulfa Antibiotics Nausea Only   Sick    Zoledronic Acid Swelling   REACTION: Severe edema      Medication List    STOP taking these medications   azithromycin 250 MG tablet Commonly known as:  ZITHROMAX Z-PAK   cefdinir 300 MG capsule Commonly known as:  OMNICEF     TAKE these medications   acetaminophen 500 MG tablet Commonly  known as:  TYLENOL Take 500 mg by mouth 2 (two) times daily as needed for headache.   albuterol 108 (90 Base) MCG/ACT inhaler Commonly known as:  PROVENTIL HFA;VENTOLIN HFA Inhale 1-2 puffs into the lungs every 6 (six) hours as needed (wheezing & shortness of breath). Reported on 02/28/2015   amoxicillin-clavulanate 875-125 MG tablet Commonly known as:  AUGMENTIN Take 1 tablet by mouth 2 (two) times daily.   budesonide-formoterol 160-4.5 MCG/ACT inhaler Commonly known as:  SYMBICORT Inhale 2 puffs 2 (two) times daily into the lungs.   butalbital-acetaminophen-caffeine 50-325-40 MG tablet Commonly known as:  FIORICET, ESGIC Take 1 tablet by mouth every 4 (four) hours as needed for headache.   CALCIUM 600-D 600-400 MG-UNIT tablet Generic drug:  Calcium Carbonate-Vitamin D Take 1 tablet by mouth 2 (two) times daily with a meal.   cholecalciferol 1000 units tablet Commonly known as:  VITAMIN D Take 1,000 Units by mouth daily.   diclofenac sodium 1 % Gel Commonly known as:  VOLTAREN Place 1 application onto the skin 2 (two) times daily as needed.   gabapentin 100 MG capsule Commonly known as:  NEURONTIN Take 1 capsule (100 mg total) by mouth 2 (two) times daily.   hyoscyamine 0.125 MG SL tablet Commonly known as:  LEVSIN SL Take 0.125 mg by mouth as needed. Abdominal pain   lactulose 10  GM/15ML solution Commonly known as:  CHRONULAC TAKE 2 TEASPOONFUL (10ML) BY MOUTH AT BEDTIME AS NEEDED FOR CONSTIPATION What changed:  See the new instructions.   multivitamin with minerals tablet Take 1 tablet by mouth daily.   nitroGLYCERIN 0.4 MG SL tablet Commonly known as:  NITROSTAT Place 1 tablet (0.4 mg total) under the tongue every 5 (five) minutes as needed for chest pain.   omeprazole 40 MG capsule Commonly known as:  PRILOSEC Take 1 capsule (40 mg total) by mouth 2 (two) times daily.   oxyCODONE-acetaminophen 7.5-325 MG tablet Commonly known as:  PERCOCET Take 1 tablet by  mouth 2 (two) times daily as needed for severe pain.   OXYGEN Inhale 2 L into the lungs as needed.   PROBIOTIC DAILY PO Take 1 tablet by mouth daily at 12 noon.   promethazine 25 MG suppository Commonly known as:  PHENERGAN Place 1 suppository (25 mg total) rectally every 6 (six) hours as needed for nausea or vomiting.   vitamin C 500 MG tablet Commonly known as:  ASCORBIC ACID Take 500 mg by mouth daily.   VITAMIN E PO Take 1 tablet by mouth daily.      Follow-up Information    Debbrah Alar, NP. Schedule an appointment as soon as possible for a visit in 1 week(s).   Specialty:  Internal Medicine Contact information: [address] [place] [address] [phone]          Allergies  Allergen Reactions  . Cefdinir Diarrhea  . Codeine Diarrhea and Nausea And Vomiting    REACTION: n/v/d, HA  . Influenza Vaccines     Stomach pain, headache  . Morphine Diarrhea and Nausea And Vomiting    REACTION: n/v/d, HA  . Sulfa Antibiotics Nausea Only    Sick    . Zoledronic Acid Swelling    REACTION: Severe edema      Consultations:   None   Other Procedures/Studies: Ct Abdomen Pelvis W Contrast  Result Date: 02/03/2018 CLINICAL DATA:  Abd pain, acute, generalized, with fever. 85 year old with abdominal pain, nausea and vomiting. Generalized weakness. EXAM: CT ABDOMEN AND PELVIS WITH CONTRAST TECHNIQUE: Multidetector CT imaging of the abdomen and pelvis was performed using the standard protocol following bolus administration of intravenous contrast. CONTRAST:  23mL OMNIPAQUE IOHEXOL 300 MG/ML  SOLN COMPARISON:  CT 05/27/2017 FINDINGS: Lower chest: Clustered nodular opacities within the lingula, left lower lobe, and to a lesser degree right middle lobe. Aortic atherosclerosis and tortuosity. Heart is normal in size. Hepatobiliary: Postcholecystectomy with chronic pneumobilia and prominent peripheral intrahepatic bile ducts. No focal hepatic lesion.  Pancreas: No ductal dilatation or inflammation. Spleen: Normal in size without focal abnormality. Adrenals/Urinary Tract: Normal adrenal glands. No hydronephrosis or perinephric edema. Homogeneous renal enhancement with symmetric excretion on delayed phase imaging. Tiny bilateral renal cortical hypodensities are too small to characterize but likely small cysts. Urinary bladder is physiologically distended without wall thickening. Stomach/Bowel: Bowel evaluation is limited in the absence of enteric contrast and paucity of intra-abdominal fat. Small hiatal hernia. Fluid-filled stomach with mild mucosal enhancement. Duodenal diverticulum without acute inflammation. Streak artifact from spinal stimulator battery pack in the right lower quadrant partially obscures evaluation of regional bowel loops. Allowing for this, mild small bowel wall enhancement without obstruction. The appendix demonstrate similar enhancement to small bowel without periappendiceal inflammation. There is colonic tortuosity. Descending and sigmoid colon are nondistended, difficult to exclude underlying wall thickening. Mild diverticulosis without evidence diverticulitis. Mild rectal distension. Vascular/Lymphatic:  Aortic atherosclerosis and tortuosity. No aneurysm. Portal and mesenteric veins are patent. No acute vascular findings. No definite enlarged abdominal or pelvic lymph nodes. Reproductive: Status post hysterectomy. No adnexal masses. Other: Battery packs from 2 spinal stimulators 1 in the right anterior abdominal wall 1 in the posterior left flank. Leads appear intact where visualized. Small volume of ascites in the abdomen and pelvis. No free air. Musculoskeletal: Scoliosis and multilevel degenerative change throughout the spine. There are no acute or suspicious osseous abnormalities. IMPRESSION: 1. Fluid-filled stomach and small bowel with mild mucosal enhancement, suggesting gastroenteritis. Descending and sigmoid colon are nondistended,  difficult to exclude an element of colonic wall thickening/colitis. 2. Small volume of ascites is likely reactive. 3. Clustered nodular opacities in the lingula, left lower lobe and to a lesser degree right middle lobe, likely infectious. Distribution is atypical for that seen with aspiration. 4.  Aortic Atherosclerosis (ICD10-I70.0). Electronically Signed   By: Keith Rake M.D.   On: 02/03/2018 22:57   Dg Chest Port 1 View  Result Date: 02/03/2018 CLINICAL DATA:  Dyspnea EXAM: PORTABLE CHEST 1 VIEW COMPARISON:  06/29/2015 chest radiograph. FINDINGS: Inferior approach spinal stimulator leads terminate over the lower thoracic spinal canal. Stable cardiomediastinal silhouette with normal heart size. No pneumothorax. No pleural effusion. Lungs appear clear, with no acute consolidative airspace disease and no pulmonary edema. IMPRESSION: No active disease. Electronically Signed   By: Ilona Sorrel M.D.   On: 02/03/2018 20:27      TODAY-DAY OF DISCHARGE:  Subjective:   [name] [name] today has no headache,no chest abdominal pain,no new weakness tingling or numbness, feels much better wants to go home today.   Objective:   Blood pressure 136/70, pulse 83, temperature 97.8 F (36.6 C), temperature source Oral, resp. rate 18, height 5\' 3"  (1.6 m), weight 44.5 kg, SpO2 100 %.  Intake/Output Summary (Last 24 hours) at 02/06/2018 0802 Last data filed at 02/06/2018 0659 Gross per 24 hour  Intake 405 ml  Output -  Net 405 ml   Filed Weights   02/03/18 1921  Weight: 44.5 kg    Exam: Awake Alert, Oriented *3, No new F.N deficits, Normal affect [place].AT,PERRAL Supple Neck,No JVD, No cervical lymphadenopathy appriciated.  Symmetrical Chest wall movement, Good air movement bilaterally, CTAB RRR,No Gallops,Rubs or new Murmurs, No Parasternal Heave +ve B.Sounds, Abd Soft, Non tender, No organomegaly appriciated, No rebound -guarding or rigidity. No Cyanosis, Clubbing or edema, No new Rash or  bruise   PERTINENT RADIOLOGIC STUDIES: Ct Abdomen Pelvis W Contrast  Result Date: 02/03/2018 CLINICAL DATA:  Abd pain, acute, generalized, with fever. 85 year old with abdominal pain, nausea and vomiting. Generalized weakness. EXAM: CT ABDOMEN AND PELVIS WITH CONTRAST TECHNIQUE: Multidetector CT imaging of the abdomen and pelvis was performed using the standard protocol following bolus administration of intravenous contrast. CONTRAST:  22mL OMNIPAQUE IOHEXOL 300 MG/ML  SOLN COMPARISON:  CT 05/27/2017 FINDINGS: Lower chest: Clustered nodular opacities within the lingula, left lower lobe, and to a lesser degree right middle lobe. Aortic atherosclerosis and tortuosity. Heart is normal in size. Hepatobiliary: Postcholecystectomy with chronic pneumobilia and prominent peripheral intrahepatic bile ducts. No focal hepatic lesion. Pancreas: No ductal dilatation or inflammation. Spleen: Normal in size without focal abnormality. Adrenals/Urinary Tract: Normal adrenal glands. No hydronephrosis or perinephric edema. Homogeneous renal enhancement with symmetric excretion on delayed phase imaging. Tiny bilateral renal cortical hypodensities are too small to characterize but likely small cysts. Urinary bladder is physiologically distended without wall thickening. Stomach/Bowel: Bowel evaluation is limited  in the absence of enteric contrast and paucity of intra-abdominal fat. Small hiatal hernia. Fluid-filled stomach with mild mucosal enhancement. Duodenal diverticulum without acute inflammation. Streak artifact from spinal stimulator battery pack in the right lower quadrant partially obscures evaluation of regional bowel loops. Allowing for this, mild small bowel wall enhancement without obstruction. The appendix demonstrate similar enhancement to small bowel without periappendiceal inflammation. There is colonic tortuosity. Descending and sigmoid colon are nondistended, difficult to exclude underlying wall thickening. Mild  diverticulosis without evidence diverticulitis. Mild rectal distension. Vascular/Lymphatic: Aortic atherosclerosis and tortuosity. No aneurysm. Portal and mesenteric veins are patent. No acute vascular findings. No definite enlarged abdominal or pelvic lymph nodes. Reproductive: Status post hysterectomy. No adnexal masses. Other: Battery packs from 2 spinal stimulators 1 in the right anterior abdominal wall 1 in the posterior left flank. Leads appear intact where visualized. Small volume of ascites in the abdomen and pelvis. No free air. Musculoskeletal: Scoliosis and multilevel degenerative change throughout the spine. There are no acute or suspicious osseous abnormalities. IMPRESSION: 1. Fluid-filled stomach and small bowel with mild mucosal enhancement, suggesting gastroenteritis. Descending and sigmoid colon are nondistended, difficult to exclude an element of colonic wall thickening/colitis. 2. Small volume of ascites is likely reactive. 3. Clustered nodular opacities in the lingula, left lower lobe and to a lesser degree right middle lobe, likely infectious. Distribution is atypical for that seen with aspiration. 4.  Aortic Atherosclerosis (ICD10-I70.0). Electronically Signed   By: Keith Rake M.D.   On: 02/03/2018 22:57   Dg Chest Port 1 View  Result Date: 02/03/2018 CLINICAL DATA:  Dyspnea EXAM: PORTABLE CHEST 1 VIEW COMPARISON:  06/29/2015 chest radiograph. FINDINGS: Inferior approach spinal stimulator leads terminate over the lower thoracic spinal canal. Stable cardiomediastinal silhouette with normal heart size. No pneumothorax. No pleural effusion. Lungs appear clear, with no acute consolidative airspace disease and no pulmonary edema. IMPRESSION: No active disease. Electronically Signed   By: Ilona Sorrel M.D.   On: 02/03/2018 20:27     PERTINENT LAB RESULTS: CBC: Recent Labs    02/03/18 2027  WBC 8.9  HGB 17.4*  HCT 56.1*  PLT 223   CMET CMP     Component Value Date/Time   NA  141 02/05/2018 1044   K 3.6 02/05/2018 1044   CL 109 02/05/2018 1044   CO2 23 02/05/2018 1044   GLUCOSE 104 (H) 02/05/2018 1044   BUN 12 02/05/2018 1044   CREATININE 0.68 02/05/2018 1044   CREATININE 0.94 (H) 05/22/2017 1427   CALCIUM 6.9 (L) 02/05/2018 1044   PROT 7.2 02/03/2018 2027   ALBUMIN 3.7 02/03/2018 2027   AST 27 02/03/2018 2027   ALT 16 02/03/2018 2027   ALKPHOS 92 02/03/2018 2027   BILITOT 0.6 02/03/2018 2027   GFRNONAA >60 02/05/2018 1044   GFRNONAA 57 (L) 04/27/2017 1446   GFRAA >60 02/05/2018 1044   GFRAA 67 04/27/2017 1446    GFR Estimated Creatinine Clearance: 32.8 mL/min (by C-G formula based on SCr of 0.68 mg/dL). Recent Labs    02/03/18 2027  LIPASE 25   No results for input(s): CKTOTAL, CKMB, CKMBINDEX, TROPONINI in the last 72 hours. Invalid input(s): POCBNP No results for input(s): DDIMER in the last 72 hours. No results for input(s): HGBA1C in the last 72 hours. No results for input(s): CHOL, HDL, LDLCALC, TRIG, CHOLHDL, LDLDIRECT in the last 72 hours. No results for input(s): TSH, T4TOTAL, T3FREE, THYROIDAB in the last 72 hours.  Invalid input(s): FREET3 No results for input(s): VITAMINB12,  FOLATE, FERRITIN, TIBC, IRON, RETICCTPCT in the last 72 hours. Coags: No results for input(s): INR in the last 72 hours.  Invalid input(s): PT Microbiology: Recent Results (from the past 240 hour(s))  Blood Culture (routine x 2)     Status: None (Preliminary result)   Collection Time: 02/03/18  8:25 PM  Result Value Ref Range Status   Specimen Description BLOOD RIGHT ANTECUBITAL  Final   Special Requests   Final    BOTTLES DRAWN AEROBIC AND ANAEROBIC Blood Culture adequate volume   Culture   Final    NO GROWTH 3 DAYS Performed at [facility], [address], [place], [place] [address]    Report Status PENDING  Incomplete  Blood Culture (routine x 2)     Status: None (Preliminary result)   Collection Time: 02/03/18  8:33 PM  Result Value Ref  Range Status   Specimen Description BLOOD LEFT ANTECUBITAL  Final   Special Requests   Final    BOTTLES DRAWN AEROBIC AND ANAEROBIC Blood Culture adequate volume   Culture   Final    NO GROWTH 3 DAYS Performed at [facility], [place] [address] [place], [place] [address]    Report Status PENDING  Incomplete  MRSA PCR Screening     Status: None   Collection Time: 02/04/18  3:30 PM  Result Value Ref Range Status   MRSA by PCR NEGATIVE NEGATIVE Final    Comment:        The GeneXpert MRSA Assay (FDA approved for NASAL specimens only), is one component of a comprehensive MRSA colonization surveillance program. It is not intended to diagnose MRSA infection nor to guide or monitor treatment for MRSA infections. Performed at [facility], [place] [address] [place], [place] [address]     FURTHER DISCHARGE INSTRUCTIONS:  Get Medicines reviewed and adjusted: Please take all your medications with you for your next visit with your Primary MD  Laboratory/radiological data: Please request your Primary MD to go over all hospital tests and procedure/radiological results at the follow up, please ask your Primary MD to get all Hospital records sent to his/her office.  In some cases, they will be blood work, cultures and biopsy results pending at the time of your discharge. Please request that your primary care M.D. goes through all the records of your hospital data and follows up on these results.  Also Note the following: If you experience worsening of your admission symptoms, develop shortness of breath, life threatening emergency, suicidal or homicidal thoughts you must seek medical attention immediately by calling 911 or calling your MD immediately  if symptoms less severe.  You must read complete instructions/literature along with all the possible adverse reactions/side effects for all the Medicines you take and that have been prescribed to you. Take any new Medicines after you have  completely understood and accpet all the possible adverse reactions/side effects.   Do not drive when taking Pain medications or sleeping medications (Benzodaizepines)  Do not take more than prescribed Pain, Sleep and Anxiety Medications. It is not advisable to combine anxiety,sleep and pain medications without talking with your primary care practitioner  Special Instructions: If you have smoked or chewed Tobacco  in the last 2 yrs please stop smoking, stop any regular Alcohol  and or any Recreational drug use.  Wear Seat belts while driving.  Please note: You were cared for by a hospitalist during your hospital stay. Once you are discharged, your primary care physician  will handle any further medical issues. Please note that NO REFILLS for any discharge medications will be authorized once you are discharged, as it is imperative that you return to your primary care physician (or establish a relationship with a primary care physician if you do not have one) for your post hospital discharge needs so that they can reassess your need for medications and monitor your lab values.  Total Time spent coordinating discharge including counseling, education and face to face time equals 35 minutes.  SignedOren Binet 02/06/2018 8:02 AM

## 2018-02-06 NOTE — Progress Notes (Signed)
Nsg Discharge Note  Admit Date:  02/03/2018 Discharge date: 02/06/2018   [name] [name] to be D/C'd Home per MD order.  AVS completed.  Copy for chart, and copy for patient signed, and dated. Patient/caregiver able to verbalize understanding.  Discharge Medication: Allergies as of 02/06/2018      Reactions   Cefdinir Diarrhea   Codeine Diarrhea, Nausea And Vomiting   REACTION: n/v/d, HA   Influenza Vaccines    Stomach pain, headache   Morphine Diarrhea, Nausea And Vomiting   REACTION: n/v/d, HA   Sulfa Antibiotics Nausea Only   Sick    Zoledronic Acid Swelling   REACTION: Severe edema      Medication List    STOP taking these medications   azithromycin 250 MG tablet Commonly known as:  ZITHROMAX Z-PAK   cefdinir 300 MG capsule Commonly known as:  OMNICEF     TAKE these medications   acetaminophen 500 MG tablet Commonly known as:  TYLENOL Take 500 mg by mouth 2 (two) times daily as needed for headache.   albuterol 108 (90 Base) MCG/ACT inhaler Commonly known as:  PROVENTIL HFA;VENTOLIN HFA Inhale 1-2 puffs into the lungs every 6 (six) hours as needed (wheezing & shortness of breath). Reported on 02/28/2015   amoxicillin-clavulanate 875-125 MG tablet Commonly known as:  AUGMENTIN Take 1 tablet by mouth 2 (two) times daily.   budesonide-formoterol 160-4.5 MCG/ACT inhaler Commonly known as:  SYMBICORT Inhale 2 puffs 2 (two) times daily into the lungs.   butalbital-acetaminophen-caffeine 50-325-40 MG tablet Commonly known as:  FIORICET, ESGIC Take 1 tablet by mouth every 4 (four) hours as needed for headache.   CALCIUM 600-D 600-400 MG-UNIT tablet Generic drug:  Calcium Carbonate-Vitamin D Take 1 tablet by mouth 2 (two) times daily with a meal.   cholecalciferol 1000 units tablet Commonly known as:  VITAMIN D Take 1,000 Units by mouth daily.   diclofenac sodium 1 % Gel Commonly known as:  VOLTAREN Place 1 application onto the skin 2 (two) times daily as needed.    gabapentin 100 MG capsule Commonly known as:  NEURONTIN Take 1 capsule (100 mg total) by mouth 2 (two) times daily.   hyoscyamine 0.125 MG SL tablet Commonly known as:  LEVSIN SL Take 0.125 mg by mouth as needed. Abdominal pain   lactulose 10 GM/15ML solution Commonly known as:  CHRONULAC TAKE 2 TEASPOONFUL (10ML) BY MOUTH AT BEDTIME AS NEEDED FOR CONSTIPATION What changed:  See the new instructions.   multivitamin with minerals tablet Take 1 tablet by mouth daily.   nitroGLYCERIN 0.4 MG SL tablet Commonly known as:  NITROSTAT Place 1 tablet (0.4 mg total) under the tongue every 5 (five) minutes as needed for chest pain.   omeprazole 40 MG capsule Commonly known as:  PRILOSEC Take 1 capsule (40 mg total) by mouth 2 (two) times daily.   oxyCODONE-acetaminophen 7.5-325 MG tablet Commonly known as:  PERCOCET Take 1 tablet by mouth 2 (two) times daily as needed for severe pain.   OXYGEN Inhale 2 L into the lungs as needed.   PROBIOTIC DAILY PO Take 1 tablet by mouth daily at 12 noon.   promethazine 25 MG suppository Commonly known as:  PHENERGAN Place 1 suppository (25 mg total) rectally every 6 (six) hours as needed for nausea or vomiting.   vitamin C 500 MG tablet Commonly known as:  ASCORBIC ACID Take 500 mg by mouth daily.   VITAMIN E PO Take 1 tablet by mouth daily.  Discharge Assessment: Vitals:   02/05/18 2036 02/06/18 0523  BP: 124/62 136/70  Pulse: 97 83  Resp: 18 18  Temp: 98.1 F (36.7 C) 97.8 F (36.6 C)  SpO2: 92% 100%   Skin clean, dry and intact without evidence of skin break down, no evidence of skin tears noted. IV catheter discontinued intact. Site without signs and symptoms of complications - no redness or edema noted at insertion site, patient denies c/o pain - only slight tenderness at site.  Dressing with slight pressure applied.  D/c Instructions-Education: Discharge instructions given to patient/family with verbalized  understanding. D/c education completed with patient/family including follow up instructions, medication list, d/c activities limitations if indicated, with other d/c instructions as indicated by MD - patient able to verbalize understanding, all questions fully answered. Patient instructed to return to ED, call 911, or call MD for any changes in condition.  Patient escorted via [place], and D/C home via private auto.  Hiram Comber, RN 02/06/2018 8:13 AM

## 2018-02-08 ENCOUNTER — Telehealth: Payer: Self-pay

## 2018-02-08 LAB — CULTURE, BLOOD (ROUTINE X 2)
Culture: NO GROWTH
Culture: NO GROWTH
Special Requests: ADEQUATE
Special Requests: ADEQUATE

## 2018-02-08 NOTE — Telephone Encounter (Signed)
Spoke with patient who states her daughter handleas all of her appointments and business. States her daughter is not in at this time but will have her return my call to schedule.

## 2018-02-11 ENCOUNTER — Other Ambulatory Visit: Payer: Self-pay

## 2018-02-11 NOTE — Patient Outreach (Signed)
[place] [facility]) Care Management  02/11/2018  Lavanya Roa [birth year] [id number]   EMMI-PNEUMONIA  RED ON EMMI ALERT Day # 2 Date: 02/10/18  1:06 PM Red Alert Reason: "More short of breath than yesterday? Yes", Wheezing more than yesterday? Yes", "Been confused? Yes", "Had diarrhea or felt sick to stomach? Yes"  Outreach attempt # 1 unsuccessful. Spoke with patient who states now is not a good time. She is agreeable to a call back tomorrow.   Plan: RN CM will make outreach attempt to patient tomorrow as stated to patient.    Barb Merino, RN,CCM [facility] Care Management Care Management Coordinator Direct Phone: [phone] Toll Free: [phone] Fax: [phone]

## 2018-02-12 ENCOUNTER — Other Ambulatory Visit: Payer: Self-pay

## 2018-02-12 NOTE — Patient Outreach (Signed)
[place] [facility]) Care Management  02/12/2018  Amarissa Koerner [birth year] [id number]    EMMI-PNEUMONIA #2 ATTEMPT  RED ON EMMI ALERT Day # 3 Date: 02/11/18  10:02 AM Red Alert Reason: "Been to follow-up appointment? No", "More short of breath than yesterday? Yes", Wheezing more than yesterday? Yes", "Been confused? Yes", "Swelling in hands/feet or changes in weight? Yes"    Outreach attempt # 2 unsuccessful. No answer and unable to leave voicemail message.  Plan: RN CM will make outreach attempt to patient within 3-4 business days. RN CM will send unsuccessful outreach letter to patient.    Barb Merino, RN,CCM [facility] Care Management Care Management Coordinator Direct Phone: [phone] Toll Free: [phone] Fax: [phone]

## 2018-02-16 ENCOUNTER — Encounter: Payer: Self-pay | Admitting: Family

## 2018-02-16 ENCOUNTER — Ambulatory Visit (INDEPENDENT_AMBULATORY_CARE_PROVIDER_SITE_OTHER): Payer: Medicare Other | Admitting: Family

## 2018-02-16 ENCOUNTER — Ambulatory Visit (HOSPITAL_BASED_OUTPATIENT_CLINIC_OR_DEPARTMENT_OTHER)
Admission: RE | Admit: 2018-02-16 | Discharge: 2018-02-16 | Disposition: A | Discharge status: Home / Self Care | Payer: Medicare Other | Source: Ambulatory Visit | Attending: Family | Admitting: Family

## 2018-02-16 VITALS — BP 142/64 | HR 96 | Temp 98.5°F | Resp 16 | Ht 60.0 in | Wt 99.0 lb

## 2018-02-16 DIAGNOSIS — J690 Pneumonitis due to inhalation of food and vomit: Secondary | ICD-10-CM | POA: Diagnosis not present

## 2018-02-16 DIAGNOSIS — R4182 Altered mental status, unspecified: Secondary | ICD-10-CM

## 2018-02-16 DIAGNOSIS — J189 Pneumonia, unspecified organism: Secondary | ICD-10-CM | POA: Diagnosis not present

## 2018-02-16 LAB — CBC WITH DIFFERENTIAL/PLATELET
Basophils Absolute: 0 10*3/uL (ref 0.0–0.1)
Basophils Relative: 0.3 % (ref 0.0–3.0)
Eosinophils Absolute: 0.1 10*3/uL (ref 0.0–0.7)
Eosinophils Relative: 1.8 % (ref 0.0–5.0)
HCT: 34.8 % — ABNORMAL LOW (ref 36.0–46.0)
Hemoglobin: 11.4 g/dL — ABNORMAL LOW (ref 12.0–15.0)
Lymphocytes Relative: 11.5 % — ABNORMAL LOW (ref 12.0–46.0)
Lymphs Abs: 0.6 10*3/uL — ABNORMAL LOW (ref 0.7–4.0)
MCHC: 32.6 g/dL (ref 30.0–36.0)
MCV: 92.7 fl (ref 78.0–100.0)
Monocytes Absolute: 0.3 10*3/uL (ref 0.1–1.0)
Monocytes Relative: 5.8 % (ref 3.0–12.0)
Neutro Abs: 4.3 10*3/uL (ref 1.4–7.7)
Neutrophils Relative %: 80.6 % — ABNORMAL HIGH (ref 43.0–77.0)
Platelets: 282 10*3/uL (ref 150.0–400.0)
RBC: 3.76 Mil/uL — ABNORMAL LOW (ref 3.87–5.11)
RDW: 15.3 % (ref 11.5–15.5)
WBC: 5.3 10*3/uL (ref 4.0–10.5)

## 2018-02-16 LAB — POC URINALSYSI DIPSTICK (AUTOMATED)
Blood, UA: NEGATIVE
Glucose, UA: NEGATIVE
Ketones, UA: NEGATIVE
Leukocytes, UA: NEGATIVE
Nitrite, UA: NEGATIVE
Protein, UA: POSITIVE — AB
Spec Grav, UA: 1.02 (ref 1.010–1.025)
Urobilinogen, UA: NEGATIVE E.U./dL — AB
pH, UA: 5.5 (ref 5.0–8.0)

## 2018-02-16 LAB — COMPREHENSIVE METABOLIC PANEL
ALT: 8 U/L (ref 0–35)
AST: 15 U/L (ref 0–37)
Albumin: 3.5 g/dL (ref 3.5–5.2)
Alkaline Phosphatase: 85 U/L (ref 39–117)
BUN: 20 mg/dL (ref 6–23)
CO2: 34 mEq/L — ABNORMAL HIGH (ref 19–32)
Calcium: 8.8 mg/dL (ref 8.4–10.5)
Chloride: 101 mEq/L (ref 96–112)
Creatinine, Ser: 0.78 mg/dL (ref 0.40–1.20)
GFR: 69.26 mL/min (ref >60.00)
Glucose, Bld: 102 mg/dL — ABNORMAL HIGH (ref 70–99)
Potassium: 4.3 mEq/L (ref 3.5–5.1)
Sodium: 141 mEq/L (ref 135–145)
Total Bilirubin: 0.2 mg/dL (ref 0.2–1.2)
Total Protein: 5.7 g/dL — ABNORMAL LOW (ref 6.0–8.3)

## 2018-02-16 NOTE — Patient Instructions (Signed)
Please complete lab work prior to leaving.  Complete chest x-ray on the first floor.  

## 2018-02-16 NOTE — Telephone Encounter (Signed)
Will address at appointment this afternoon.

## 2018-02-16 NOTE — Progress Notes (Signed)
Subjective:    Patient ID: [name] [name], female    DOB: [birth year], 85 y.o.   MRN: [id number]  HPI  Patient is a 85 yr old female who presents today for hospital follow up. She was admitted 1/8-1/11 2020.  Patient presented with Nausea/vomitting and SOB.  She was found to have sepsis in the setting of gastroenteritis/probable aspiration pneumonia.  She was treated with vanc/cefepime, flagyl/unasyn and discharged home on augmentin.   She was sent home with home health PT. Patient and daughter declined PT.  Reports she is ambulating ok but has some SOB with ambulation.  Daughter thinks she gets "anxious and sob all at one time."  She use oxygen 2 liters at night only. Daughter feels like she has had some confusion since her hospitalization. Also felt like she had a lot of swelling and too much fluid at the time of her discharge but now is improved.   Lab Results  Component Value Date   TSH 0.92 04/27/2017    Wt Readings from Last 3 Encounters:  02/16/18 99 lb (44.9 kg)  02/03/18 98 lb (44.5 kg)  05/22/17 99 lb (44.9 kg)   Patient reports feeling "sluggish."  Reports good PO intake.    Review of Systems See HPI  Past Medical History:  Diagnosis Date  . Abdominal pain    Resolved  . Cardiac arrhythmia   . CHF (congestive heart failure) ([place])    Diastolic  on ECHO 7622  . Choledocholithiasis   . Chronic anemia   . Chronic back pain   . COPD (chronic obstructive pulmonary disease) (HCC)    oxygen dependent at  night 2LPM  . Daily headache   . GERD (gastroesophageal reflux disease)   . HCAP (healthcare-associated pneumonia) 03/28/2014  . Hiatal hernia   . Hyperlipidemia   . Hypertension   . On home oxygen therapy    "1L during the day; 2L q hs" (03/28/2014)  . Osteoarthritis    "pretty much q where"   . Osteoporosis   . Pancolitis (HCC)    Infectious vs. inflammatory  . Pancreatitis    History of  . Pneumonia 1930's; 2011 X 2; 02/2014  . PONV (postoperative nausea and  vomiting)   . PUD (peptic ulcer disease)   . Reactive airway disease that is not asthma   . Recurrent UTI (urinary tract infection)    "here lately" (03/28/2014)  . Scoliosis deformity of spine    history of intractable back pain     Social History   Socioeconomic History  . Marital status: Widowed    Spouse name: Not on file  . Number of children: 3  . Years of education: Not on file  . Highest education level: Not on file  Occupational History    Employer: RETIRED  Social Needs  . Financial resource strain: Not on file  . Food insecurity:    Worry: Not on file    Inability: Not on file  . Transportation needs:    Medical: Not on file    Non-medical: Not on file  Tobacco Use  . Smoking status: Former Smoker    Packs/day: 1.00    Years: 12.00    Pack years: 12.00    Types: Cigarettes    Last attempt to quit: 01/28/1988    Years since quitting: 30.0  . Smokeless tobacco: Never Used  Substance and Sexual Activity  . Alcohol use: No  . Drug use: No  . Sexual activity: Not Currently  Lifestyle  .  Physical activity:    Days per week: Not on file    Minutes per session: Not on file  . Stress: Not on file  Relationships  . Social connections:    Talks on phone: Not on file    Gets together: Not on file    Attends religious service: Not on file    Active member of club or organization: Not on file    Attends meetings of clubs or organizations: Not on file    Relationship status: Not on file  . Intimate partner violence:    Fear of current or ex partner: Not on file    Emotionally abused: Not on file    Physically abused: Not on file    Forced sexual activity: Not on file  Other Topics Concern  . Not on file  Social History Narrative   1 grandchild at care link--Angelia   Lives with great-granddaughter    Past Surgical History:  Procedure Laterality Date  . ABDOMINAL HYSTERECTOMY  1970s  . BACK SURGERY     x 5  . BREAST CYST EXCISION Right 1960's  . CERVICAL  LAMINECTOMY  1970s  . CHOLECYSTECTOMY  2008  . ERCP W/ SPHICTEROTOMY  02/2010  . ESOPHAGOGASTRODUODENOSCOPY N/A 11/14/2013   Procedure: ESOPHAGOGASTRODUODENOSCOPY (EGD);  Surgeon: Missy Sabins, MD;  Location: [facility] ENDOSCOPY;  Service: Endoscopy;  Laterality: N/A;  . FLEXIBLE SIGMOIDOSCOPY N/A 01/24/2013   Procedure: FLEXIBLE SIGMOIDOSCOPY;  Surgeon: Missy Sabins, MD;  Location: [place];  Service: Endoscopy;  Laterality: N/A;  . PAIN PUMP IMPLANTATION  ~ 2010   back, dilaudid and bupravacaine  . SPINAL CORD STIMULATOR IMPLANT  ~ 2009  . SPINE SURGERY  1970's,1995,2007    Family History  Problem Relation Age of Onset  . Cancer Mother        uterine  . Heart disease Father   . Hypertension Other   . Osteoporosis Neg Hx     Allergies  Allergen Reactions  . Cefdinir Diarrhea  . Codeine Diarrhea and Nausea And Vomiting    REACTION: n/v/d, HA  . Influenza Vaccines     Stomach pain, headache  . Morphine Diarrhea and Nausea And Vomiting    REACTION: n/v/d, HA  . Sulfa Antibiotics Nausea Only    Sick    . Zoledronic Acid Swelling    REACTION: Severe edema    Current Outpatient Medications on File Prior to Visit  Medication Sig Dispense Refill  . acetaminophen (TYLENOL) 500 MG tablet Take 500 mg by mouth 2 (two) times daily as needed for headache.    . albuterol (PROVENTIL HFA;VENTOLIN HFA) 108 (90 Base) MCG/ACT inhaler Inhale 1-2 puffs into the lungs every 6 (six) hours as needed (wheezing & shortness of breath). Reported on 02/28/2015 1 Inhaler 5  . amoxicillin-clavulanate (AUGMENTIN) 875-125 MG tablet Take 1 tablet by mouth 2 (two) times daily. 6 tablet 0  . Ascorbic Acid (VITAMIN C) 500 MG tablet Take 500 mg by mouth daily.      . budesonide-formoterol (SYMBICORT) 160-4.5 MCG/ACT inhaler Inhale 2 puffs 2 (two) times daily into the lungs. 2 Inhaler 0  . butalbital-acetaminophen-caffeine (FIORICET, ESGIC) 50-325-40 MG tablet Take 1 tablet by mouth every 4 (four) hours as needed  for headache.     . Calcium Carbonate-Vitamin D (CALCIUM 600-D) 600-400 MG-UNIT per tablet Take 1 tablet by mouth 2 (two) times daily with a meal.      . cholecalciferol (VITAMIN D) 1000 units tablet Take 1,000 Units by mouth daily.    [name]  diclofenac sodium (VOLTAREN) 1 % GEL Place 1 application onto the skin 2 (two) times daily as needed.    . gabapentin (NEURONTIN) 100 MG capsule Take 1 capsule (100 mg total) by mouth 2 (two) times daily. (Patient not taking: Reported on 02/04/2018) 60 capsule 3  . hyoscyamine (LEVSIN SL) 0.125 MG SL tablet Take 0.125 mg by mouth as needed. Abdominal pain    . lactulose (CHRONULAC) 10 GM/15ML solution TAKE 2 TEASPOONFUL (10ML) BY MOUTH AT BEDTIME AS NEEDED FOR CONSTIPATION (Patient taking differently: Take 10 g by mouth daily. ) 236 mL 0  . Multiple Vitamins-Minerals (MULTIVITAMIN WITH MINERALS) tablet Take 1 tablet by mouth daily. 30 tablet   . nitroGLYCERIN (NITROSTAT) 0.4 MG SL tablet Place 1 tablet (0.4 mg total) under the tongue every 5 (five) minutes as needed for chest pain. 100 tablet 3  . omeprazole (PRILOSEC) 40 MG capsule Take 1 capsule (40 mg total) by mouth 2 (two) times daily. 60 capsule 3  . oxyCODONE-acetaminophen (PERCOCET) 7.5-325 MG tablet Take 1 tablet by mouth 2 (two) times daily as needed for severe pain.    . OXYGEN Inhale 2 L into the lungs as needed.    . Probiotic Product (PROBIOTIC DAILY PO) Take 1 tablet by mouth daily at 12 noon.    . promethazine (PHENERGAN) 25 MG suppository Place 1 suppository (25 mg total) rectally every 6 (six) hours as needed for nausea or vomiting. 12 each 0  . VITAMIN E PO Take 1 tablet by mouth daily.     Current Facility-Administered Medications on File Prior to Visit  Medication Dose Route Frequency Provider Last Rate Last Dose  . denosumab (PROLIA) injection 60 mg  60 mg Subcutaneous Once Debbrah Alar, NP        BP (!) 142/64 (BP Location: Right Arm, Patient Position: Sitting, Cuff Size: Small)    Pulse 96   Temp 98.5 F (36.9 C) (Oral)   Resp 16   Ht 5' (1.524 m)   Wt 99 lb (44.9 kg)   SpO2 96%   BMI 19.33 kg/m       Objective:   Physical Exam Constitutional:      Appearance: She is well-developed.  Neck:     Musculoskeletal: Neck supple.     Thyroid: No thyromegaly.  Cardiovascular:     Rate and Rhythm: Normal rate and regular rhythm.     Heart sounds: Normal heart sounds. No murmur.  Pulmonary:     Effort: Pulmonary effort is normal. No respiratory distress.     Breath sounds: No wheezing.     Comments: Faint bibasilar crackles.  Musculoskeletal:     Comments: Trace bilateral LE edema  Skin:    General: Skin is warm and dry.  Neurological:     Mental Status: She is alert and oriented to person, place, and time.  Psychiatric:        Behavior: Behavior normal.        Thought Content: Thought content normal.        Judgment: Judgment normal.           Assessment & Plan:  Aspiration pneumonia-  Clinically improving. Will obtain cxr to reassess and to rule out volume overload due to her c/o DOE.  Of note, pt has know diastolic dysfunction. Urinalysis is performed, no clear UTI. Will send for culture to confirm. She seems to be at her mental status baseline today.  Will continue to monitor. Will also check cmet and cbc.

## 2018-02-17 ENCOUNTER — Other Ambulatory Visit: Payer: Self-pay

## 2018-02-17 ENCOUNTER — Encounter: Payer: Self-pay | Admitting: Family

## 2018-02-17 LAB — URINE CULTURE
MICRO NUMBER:: 83329
SPECIMEN QUALITY:: ADEQUATE

## 2018-02-17 NOTE — Telephone Encounter (Signed)
Reviewed labs and cxr with daughter- no nodules on most recent cxr, no pneumonia.

## 2018-02-17 NOTE — Telephone Encounter (Signed)
Results given earlier today.

## 2018-02-17 NOTE — Telephone Encounter (Signed)
I spoke with daughter this afternoon. I told her that I think her recent illness and hospitalization has exacerbated her underlying dementia.  Suggested that daughter secure patient's credit cards and checks and help her with her finances. We also discussed a neurology referral to neurology for her dementia but she declines at this time. She will see how she does and let me know if she changes her mind.

## 2018-02-17 NOTE — Patient Outreach (Signed)
[facility] [facility]) Care Management  02/17/2018  [name] [name] November 15, 1925 [id number]    EMMI-PNEUMONIA #3 ATTEMPT  RED ON EMMI ALERT Day #3 Date:02/11/18 10:02 AM Red Alert Reason:"Been to follow-up appointment? No", "More short of breath than yesterday? Yes", Wheezing more than yesterday? Yes", "Been confused? Yes", "Swelling in hands/feet or changes in weight? Yes"  Outreach attempt # 3 unsuccessful. No answer. RN CM left HIPAA compliant voicemail message along with contact information for daughter [name] listed as approved person to speak with.   CM Plan:  Multiple attempts to establish contact with patient without success. No response from letter mailed to patient. Case is being closed at this time.    Barb Merino, RN,CCM [facility] Care Management Care Management Coordinator Direct Phone: [phone] Toll Free: [phone] Fax: [phone]

## 2018-02-19 ENCOUNTER — Other Ambulatory Visit: Payer: Self-pay

## 2018-02-19 NOTE — Patient Outreach (Signed)
[facility]) Care Management  02/19/2018  [name] [name] 01/14/1926 [id number]  EMMI:  Pneumonia red alert Referral date: 02/19/18 Referral reason: feeling better overall: no,  More short of breath than yesterday: yes Insurance: Medicare Day # 10  Telephone call to patient regarding EMMI pneumonia red alert. HIPAA verified with patient. Explained reason for call. Patient states she is not feeling worse but is not feeling much better at this time.  Patient states her breathing is, " pretty good."  Patient states she saw her primary MD on yesterday and has a follow up scheduled within 3-4 weeks.  Patient reports she had a chest xray and blood work done.   Patient states there were no changes with her treatment plan.  Patient states she is taking her medication as prescribed. She states she has completed her antibiotics.  Patient states she has good support from her family. She states her family provides transportation to her appointments. Patient denies having home health services. Patient states she didn't need home health because she has 2 granddaughters that are nurse.  Patient denies any further needs or concerns.  RNCM advised patient to notify MD of any changes in condition prior to scheduled appointment. RNCM provided contact name and number: 24 hour nurse advise line [phone].  RNCM verified patient aware of 911 services for urgent/ emergent needs.   PLAN: RNCM will close patient due to patient being assessed and having no further needs.  RNCM will send patient [facility] care management brochure/  [place]  Quinn Plowman RN,BSN,CCM [facility] Telephonic  [phone]

## 2018-02-23 DIAGNOSIS — G8929 Other chronic pain: Secondary | ICD-10-CM | POA: Diagnosis not present

## 2018-02-23 DIAGNOSIS — M545 Low back pain: Secondary | ICD-10-CM | POA: Diagnosis not present

## 2018-02-23 DIAGNOSIS — G894 Chronic pain syndrome: Secondary | ICD-10-CM | POA: Diagnosis not present

## 2018-02-23 DIAGNOSIS — R51 Headache: Secondary | ICD-10-CM | POA: Diagnosis not present

## 2018-02-23 DIAGNOSIS — Z978 Presence of other specified devices: Secondary | ICD-10-CM | POA: Diagnosis not present

## 2018-03-11 DIAGNOSIS — Z978 Presence of other specified devices: Secondary | ICD-10-CM | POA: Diagnosis not present

## 2018-03-11 DIAGNOSIS — M545 Low back pain: Secondary | ICD-10-CM | POA: Diagnosis not present

## 2018-03-11 DIAGNOSIS — G894 Chronic pain syndrome: Secondary | ICD-10-CM | POA: Diagnosis not present

## 2018-03-11 DIAGNOSIS — G8929 Other chronic pain: Secondary | ICD-10-CM | POA: Diagnosis not present

## 2018-03-18 DIAGNOSIS — M545 Low back pain: Secondary | ICD-10-CM | POA: Diagnosis not present

## 2018-03-18 DIAGNOSIS — G8929 Other chronic pain: Secondary | ICD-10-CM | POA: Diagnosis not present

## 2018-03-18 DIAGNOSIS — R51 Headache: Secondary | ICD-10-CM | POA: Diagnosis not present

## 2018-03-18 DIAGNOSIS — Z978 Presence of other specified devices: Secondary | ICD-10-CM | POA: Diagnosis not present

## 2018-03-18 DIAGNOSIS — G894 Chronic pain syndrome: Secondary | ICD-10-CM | POA: Diagnosis not present

## 2018-03-22 ENCOUNTER — Ambulatory Visit (HOSPITAL_BASED_OUTPATIENT_CLINIC_OR_DEPARTMENT_OTHER)
Admission: RE | Admit: 2018-03-22 | Discharge: 2018-03-22 | Disposition: A | Discharge status: Home / Self Care | Payer: Medicare Other | Source: Ambulatory Visit | Attending: Family | Admitting: Family

## 2018-03-22 ENCOUNTER — Ambulatory Visit (INDEPENDENT_AMBULATORY_CARE_PROVIDER_SITE_OTHER): Payer: Medicare Other | Admitting: Family

## 2018-03-22 ENCOUNTER — Encounter: Payer: Self-pay | Admitting: Family

## 2018-03-22 VITALS — BP 145/67 | HR 83 | Temp 98.2°F | Resp 16 | Ht 60.0 in | Wt 97.6 lb

## 2018-03-22 DIAGNOSIS — R1032 Left lower quadrant pain: Secondary | ICD-10-CM

## 2018-03-22 LAB — CBC WITH DIFFERENTIAL/PLATELET
Basophils Absolute: 0 10*3/uL (ref 0.0–0.1)
Basophils Relative: 0.8 % (ref 0.0–3.0)
Eosinophils Absolute: 0.1 10*3/uL (ref 0.0–0.7)
Eosinophils Relative: 2.4 % (ref 0.0–5.0)
HCT: 36 % (ref 36.0–46.0)
Hemoglobin: 11.8 g/dL — ABNORMAL LOW (ref 12.0–15.0)
Lymphocytes Relative: 31.6 % (ref 12.0–46.0)
Lymphs Abs: 1.2 10*3/uL (ref 0.7–4.0)
MCHC: 32.8 g/dL (ref 30.0–36.0)
MCV: 93.4 fl (ref 78.0–100.0)
Monocytes Absolute: 0.3 10*3/uL (ref 0.1–1.0)
Monocytes Relative: 8.9 % (ref 3.0–12.0)
Neutro Abs: 2.1 10*3/uL (ref 1.4–7.7)
Neutrophils Relative %: 56.3 % (ref 43.0–77.0)
Platelets: 167 10*3/uL (ref 150.0–400.0)
RBC: 3.85 Mil/uL — ABNORMAL LOW (ref 3.87–5.11)
RDW: 15.4 % (ref 11.5–15.5)
WBC: 3.7 10*3/uL — ABNORMAL LOW (ref 4.0–10.5)

## 2018-03-22 LAB — BASIC METABOLIC PANEL
BUN: 26 mg/dL — ABNORMAL HIGH (ref 6–23)
CO2: 32 mEq/L (ref 19–32)
Calcium: 8.2 mg/dL — ABNORMAL LOW (ref 8.4–10.5)
Chloride: 105 mEq/L (ref 96–112)
Creatinine, Ser: 1.03 mg/dL (ref 0.40–1.20)
GFR: 50.24 mL/min — ABNORMAL LOW (ref >60.00)
Glucose, Bld: 67 mg/dL — ABNORMAL LOW (ref 70–99)
Potassium: 4.6 mEq/L (ref 3.5–5.1)
Sodium: 143 mEq/L (ref 135–145)

## 2018-03-22 LAB — URINALYSIS, ROUTINE W REFLEX MICROSCOPIC
Bilirubin Urine: NEGATIVE
Hgb urine dipstick: NEGATIVE
Leukocytes,Ua: NEGATIVE
Nitrite: NEGATIVE
RBC / HPF: NONE SEEN (ref 0–?)
Specific Gravity, Urine: 1.025 (ref 1.000–1.030)
Total Protein, Urine: NEGATIVE
Urine Glucose: NEGATIVE
Urobilinogen, UA: 0.2 (ref 0.0–1.0)
pH: 5 (ref 5.0–8.0)

## 2018-03-22 NOTE — Patient Instructions (Signed)
Please complete lab work prior to leaving. Complete CT scan on the first floor. Call if symptoms worsen or if they do not improve.

## 2018-03-22 NOTE — Progress Notes (Signed)
   Subjective:    Patient ID: [name] [name], female    DOB: 11/21/1925, 85 y.o.   MRN: [id number]  HPI  Patient is a 85 yr old female who presents today with c/o intermittent LLQ pain.  Reports 3 episodes of LLQ pain. Different from the pain she had last visit.  Pain is stabbing and comes on suddenly. Reports that it gradually eases off.  Sitting down seems to help the pain.  She reports that they are trying to lower her pain pump dose.  She feels that she is too old to go back and forth to the pain clinic. She is using percocet. Notes that she does have constipation but uses lactulose and has bm's most days with this regimen.   Review of Systems     Objective:   Physical Exam Constitutional:      Appearance: She is well-developed.  Neck:     Musculoskeletal: Neck supple.     Thyroid: No thyromegaly.  Cardiovascular:     Rate and Rhythm: Normal rate and regular rhythm.     Heart sounds: Normal heart sounds. No murmur.  Pulmonary:     Effort: Pulmonary effort is normal. No respiratory distress.     Breath sounds: Normal breath sounds. No wheezing.  Abdominal:     Tenderness: There is no abdominal tenderness.  Skin:    General: Skin is warm and dry.  Neurological:     Mental Status: She is alert and oriented to person, place, and time.  Psychiatric:        Behavior: Behavior normal.        Thought Content: Thought content normal.        Judgment: Judgment normal.           Assessment & Plan:  LLQ pain- suspect that this is more of a radicular pain given the fact that her dilauded pain pump is being weaned down. Will check UA and culture (kidney stone is a possibility) as well as a KUB to assess for stone and constipation.  Check CBC. If leukocytosis may need ct scan.

## 2018-03-23 ENCOUNTER — Telehealth: Payer: Self-pay | Admitting: Family

## 2018-03-23 LAB — URINE CULTURE
MICRO NUMBER:: 232967
SPECIMEN QUALITY:: ADEQUATE

## 2018-03-23 NOTE — Telephone Encounter (Signed)
Please contact patient and let her know that her x-ray shows significant constipation.  I would like for her to add MiraLAX 1 capful in 8 ounces of water or juice once daily.  If she develops diarrhea she can cut back to every other day or as needed.  Please let me know if she has recurrent abdominal pain like she described at her appointment.

## 2018-03-24 DIAGNOSIS — G894 Chronic pain syndrome: Secondary | ICD-10-CM | POA: Diagnosis not present

## 2018-03-24 DIAGNOSIS — M545 Low back pain: Secondary | ICD-10-CM | POA: Diagnosis not present

## 2018-03-24 DIAGNOSIS — Z978 Presence of other specified devices: Secondary | ICD-10-CM | POA: Diagnosis not present

## 2018-03-24 DIAGNOSIS — G8929 Other chronic pain: Secondary | ICD-10-CM | POA: Diagnosis not present

## 2018-03-24 NOTE — Telephone Encounter (Signed)
Patient advised to start Miralax daily. She will call back if she develops the pain she described during the office visit.

## 2018-03-29 ENCOUNTER — Telehealth: Payer: Self-pay | Admitting: Family

## 2018-03-29 NOTE — Telephone Encounter (Signed)
Calcium is low, please make sure she is taking calcium supplement twice daily. Blood count is stable.

## 2018-03-30 ENCOUNTER — Other Ambulatory Visit: Payer: Self-pay | Admitting: Family

## 2018-03-31 DIAGNOSIS — Z978 Presence of other specified devices: Secondary | ICD-10-CM | POA: Diagnosis not present

## 2018-03-31 DIAGNOSIS — G894 Chronic pain syndrome: Secondary | ICD-10-CM | POA: Diagnosis not present

## 2018-03-31 NOTE — Telephone Encounter (Signed)
Lm for patient to call us back.

## 2018-04-01 NOTE — Telephone Encounter (Signed)
Patient notified of results.

## 2018-04-05 DIAGNOSIS — D0439 Carcinoma in situ of skin of other parts of face: Secondary | ICD-10-CM | POA: Diagnosis not present

## 2018-04-06 ENCOUNTER — Emergency Department (HOSPITAL_COMMUNITY): Payer: Medicare Other

## 2018-04-06 ENCOUNTER — Encounter (HOSPITAL_COMMUNITY): Payer: Self-pay

## 2018-04-06 ENCOUNTER — Inpatient Hospital Stay (HOSPITAL_COMMUNITY)
Admission: EM | Admit: 2018-04-06 | Discharge: 2018-04-09 | DRG: 871 | Disposition: A | Discharge status: Home / Self Care | Payer: Medicare Other | Attending: Internal Medicine | Admitting: Internal Medicine

## 2018-04-06 ENCOUNTER — Other Ambulatory Visit: Payer: Self-pay

## 2018-04-06 DIAGNOSIS — R5381 Other malaise: Secondary | ICD-10-CM | POA: Diagnosis present

## 2018-04-06 DIAGNOSIS — E785 Hyperlipidemia, unspecified: Secondary | ICD-10-CM | POA: Diagnosis present

## 2018-04-06 DIAGNOSIS — K567 Ileus, unspecified: Secondary | ICD-10-CM

## 2018-04-06 DIAGNOSIS — A419 Sepsis, unspecified organism: Principal | ICD-10-CM

## 2018-04-06 DIAGNOSIS — E860 Dehydration: Secondary | ICD-10-CM | POA: Diagnosis present

## 2018-04-06 DIAGNOSIS — Z681 Body mass index (BMI) 19 or less, adult: Secondary | ICD-10-CM

## 2018-04-06 DIAGNOSIS — Z882 Allergy status to sulfonamides status: Secondary | ICD-10-CM

## 2018-04-06 DIAGNOSIS — M419 Scoliosis, unspecified: Secondary | ICD-10-CM | POA: Diagnosis present

## 2018-04-06 DIAGNOSIS — G8929 Other chronic pain: Secondary | ICD-10-CM | POA: Diagnosis present

## 2018-04-06 DIAGNOSIS — I10 Essential (primary) hypertension: Secondary | ICD-10-CM | POA: Diagnosis not present

## 2018-04-06 DIAGNOSIS — R739 Hyperglycemia, unspecified: Secondary | ICD-10-CM | POA: Diagnosis present

## 2018-04-06 DIAGNOSIS — R188 Other ascites: Secondary | ICD-10-CM | POA: Diagnosis present

## 2018-04-06 DIAGNOSIS — K56609 Unspecified intestinal obstruction, unspecified as to partial versus complete obstruction: Secondary | ICD-10-CM

## 2018-04-06 DIAGNOSIS — Z885 Allergy status to narcotic agent status: Secondary | ICD-10-CM

## 2018-04-06 DIAGNOSIS — Z888 Allergy status to other drugs, medicaments and biological substances status: Secondary | ICD-10-CM

## 2018-04-06 DIAGNOSIS — J449 Chronic obstructive pulmonary disease, unspecified: Secondary | ICD-10-CM | POA: Diagnosis present

## 2018-04-06 DIAGNOSIS — K219 Gastro-esophageal reflux disease without esophagitis: Secondary | ICD-10-CM | POA: Diagnosis present

## 2018-04-06 DIAGNOSIS — R0902 Hypoxemia: Secondary | ICD-10-CM | POA: Diagnosis not present

## 2018-04-06 DIAGNOSIS — Z87891 Personal history of nicotine dependence: Secondary | ICD-10-CM

## 2018-04-06 DIAGNOSIS — R140 Abdominal distension (gaseous): Secondary | ICD-10-CM | POA: Diagnosis present

## 2018-04-06 DIAGNOSIS — I5032 Chronic diastolic (congestive) heart failure: Secondary | ICD-10-CM | POA: Diagnosis present

## 2018-04-06 DIAGNOSIS — K5900 Constipation, unspecified: Secondary | ICD-10-CM | POA: Diagnosis not present

## 2018-04-06 DIAGNOSIS — K529 Noninfective gastroenteritis and colitis, unspecified: Secondary | ICD-10-CM | POA: Diagnosis not present

## 2018-04-06 DIAGNOSIS — D751 Secondary polycythemia: Secondary | ICD-10-CM | POA: Diagnosis present

## 2018-04-06 DIAGNOSIS — E876 Hypokalemia: Secondary | ICD-10-CM | POA: Diagnosis present

## 2018-04-06 DIAGNOSIS — I110 Hypertensive heart disease with heart failure: Secondary | ICD-10-CM | POA: Diagnosis present

## 2018-04-06 DIAGNOSIS — Z887 Allergy status to serum and vaccine status: Secondary | ICD-10-CM

## 2018-04-06 DIAGNOSIS — R110 Nausea: Secondary | ICD-10-CM | POA: Diagnosis not present

## 2018-04-06 DIAGNOSIS — Z79891 Long term (current) use of opiate analgesic: Secondary | ICD-10-CM

## 2018-04-06 DIAGNOSIS — E7800 Pure hypercholesterolemia, unspecified: Secondary | ICD-10-CM | POA: Diagnosis present

## 2018-04-06 DIAGNOSIS — E43 Unspecified severe protein-calorie malnutrition: Secondary | ICD-10-CM | POA: Diagnosis present

## 2018-04-06 DIAGNOSIS — J9610 Chronic respiratory failure, unspecified whether with hypoxia or hypercapnia: Secondary | ICD-10-CM | POA: Diagnosis present

## 2018-04-06 DIAGNOSIS — Z87440 Personal history of urinary (tract) infections: Secondary | ICD-10-CM

## 2018-04-06 DIAGNOSIS — Z8249 Family history of ischemic heart disease and other diseases of the circulatory system: Secondary | ICD-10-CM

## 2018-04-06 DIAGNOSIS — M810 Age-related osteoporosis without current pathological fracture: Secondary | ICD-10-CM | POA: Diagnosis present

## 2018-04-06 DIAGNOSIS — K449 Diaphragmatic hernia without obstruction or gangrene: Secondary | ICD-10-CM | POA: Diagnosis not present

## 2018-04-06 DIAGNOSIS — R1110 Vomiting, unspecified: Secondary | ICD-10-CM | POA: Diagnosis not present

## 2018-04-06 DIAGNOSIS — Z7951 Long term (current) use of inhaled steroids: Secondary | ICD-10-CM

## 2018-04-06 DIAGNOSIS — Z79899 Other long term (current) drug therapy: Secondary | ICD-10-CM

## 2018-04-06 DIAGNOSIS — Z9049 Acquired absence of other specified parts of digestive tract: Secondary | ICD-10-CM

## 2018-04-06 DIAGNOSIS — R000 Tachycardia, unspecified: Secondary | ICD-10-CM | POA: Diagnosis not present

## 2018-04-06 DIAGNOSIS — Z8711 Personal history of peptic ulcer disease: Secondary | ICD-10-CM

## 2018-04-06 DIAGNOSIS — Z9981 Dependence on supplemental oxygen: Secondary | ICD-10-CM

## 2018-04-06 DIAGNOSIS — Z90710 Acquired absence of both cervix and uterus: Secondary | ICD-10-CM

## 2018-04-06 DIAGNOSIS — R112 Nausea with vomiting, unspecified: Secondary | ICD-10-CM | POA: Diagnosis not present

## 2018-04-06 LAB — CBC WITH DIFFERENTIAL/PLATELET
Abs Immature Granulocytes: 0.05 10*3/uL (ref 0.00–0.07)
Basophils Absolute: 0 10*3/uL (ref 0.0–0.1)
Basophils Relative: 0 %
Eosinophils Absolute: 0 10*3/uL (ref 0.0–0.5)
Eosinophils Relative: 0 %
HCT: 51 % — ABNORMAL HIGH (ref 36.0–46.0)
Hemoglobin: 16 g/dL — ABNORMAL HIGH (ref 12.0–15.0)
Immature Granulocytes: 0 %
Lymphocytes Relative: 6 %
Lymphs Abs: 0.7 10*3/uL (ref 0.7–4.0)
MCH: 30.2 pg (ref 26.0–34.0)
MCHC: 31.4 g/dL (ref 30.0–36.0)
MCV: 96.4 fL (ref 80.0–100.0)
Monocytes Absolute: 0.6 10*3/uL (ref 0.1–1.0)
Monocytes Relative: 4 %
Neutro Abs: 11.7 10*3/uL — ABNORMAL HIGH (ref 1.7–7.7)
Neutrophils Relative %: 90 %
Platelets: 265 10*3/uL (ref 150–400)
RBC: 5.29 MIL/uL — ABNORMAL HIGH (ref 3.87–5.11)
RDW: 14.7 % (ref 11.5–15.5)
WBC: 13.1 10*3/uL — ABNORMAL HIGH (ref 4.0–10.5)
nRBC: 0 % (ref 0.0–0.2)

## 2018-04-06 LAB — URINALYSIS, ROUTINE W REFLEX MICROSCOPIC
Bacteria, UA: NONE SEEN
Bilirubin Urine: NEGATIVE
Glucose, UA: NEGATIVE mg/dL
Ketones, ur: 5 mg/dL — AB
Leukocytes,Ua: NEGATIVE
Nitrite: NEGATIVE
Protein, ur: 30 mg/dL — AB
Specific Gravity, Urine: 1.021 (ref 1.005–1.030)
pH: 5 (ref 5.0–8.0)

## 2018-04-06 LAB — COMPREHENSIVE METABOLIC PANEL
ALT: 14 U/L (ref 0–44)
AST: 26 U/L (ref 15–41)
Albumin: 3.8 g/dL (ref 3.5–5.0)
Alkaline Phosphatase: 80 U/L (ref 38–126)
Anion gap: 12 (ref 5–15)
BUN: 20 mg/dL (ref 8–23)
CO2: 29 mmol/L (ref 22–32)
Calcium: 9.2 mg/dL (ref 8.9–10.3)
Chloride: 99 mmol/L (ref 98–111)
Creatinine, Ser: 0.84 mg/dL (ref 0.44–1.00)
GFR calc Af Amer: >60 mL/min (ref >60)
GFR calc non Af Amer: >60 mL/min (ref >60)
Glucose, Bld: 168 mg/dL — ABNORMAL HIGH (ref 70–99)
Potassium: 3.9 mmol/L (ref 3.5–5.1)
Sodium: 140 mmol/L (ref 135–145)
Total Bilirubin: 0.4 mg/dL (ref 0.3–1.2)
Total Protein: 6.9 g/dL (ref 6.5–8.1)

## 2018-04-06 LAB — PHOSPHORUS: Phosphorus: 3 mg/dL (ref 2.5–4.6)

## 2018-04-06 LAB — MAGNESIUM: Magnesium: 1.6 mg/dL — ABNORMAL LOW (ref 1.7–2.4)

## 2018-04-06 LAB — LACTIC ACID, PLASMA: Lactic Acid, Venous: 1.9 mmol/L (ref 0.5–1.9)

## 2018-04-06 LAB — LIPASE, BLOOD: Lipase: 29 U/L (ref 11–51)

## 2018-04-06 MED ORDER — VANCOMYCIN HCL IN DEXTROSE 1-5 GM/200ML-% IV SOLN
1000.0000 mg | Freq: Once | INTRAVENOUS | Status: AC
  Administered 2018-04-06: 1000 mg via INTRAVENOUS
  Filled 2018-04-06: qty 200

## 2018-04-06 MED ORDER — METRONIDAZOLE IN NACL 5-0.79 MG/ML-% IV SOLN
500.0000 mg | Freq: Three times a day (TID) | INTRAVENOUS | Status: DC
  Administered 2018-04-06 – 2018-04-09 (×8): 500 mg via INTRAVENOUS
  Filled 2018-04-06 (×8): qty 100

## 2018-04-06 MED ORDER — ONDANSETRON HCL 4 MG/2ML IJ SOLN
4.0000 mg | Freq: Once | INTRAMUSCULAR | Status: AC
  Administered 2018-04-06: 4 mg via INTRAVENOUS
  Filled 2018-04-06: qty 2

## 2018-04-06 MED ORDER — SODIUM CHLORIDE 0.9 % IV BOLUS
1000.0000 mL | Freq: Once | INTRAVENOUS | Status: AC
  Administered 2018-04-06: 1000 mL via INTRAVENOUS

## 2018-04-06 MED ORDER — METRONIDAZOLE IN NACL 5-0.79 MG/ML-% IV SOLN
500.0000 mg | Freq: Once | INTRAVENOUS | Status: AC
  Administered 2018-04-06: 500 mg via INTRAVENOUS
  Filled 2018-04-06: qty 100

## 2018-04-06 MED ORDER — SODIUM CHLORIDE (PF) 0.9 % IJ SOLN
INTRAMUSCULAR | Status: AC
  Filled 2018-04-06: qty 50

## 2018-04-06 MED ORDER — ACETAMINOPHEN 325 MG PO TABS
650.0000 mg | ORAL_TABLET | Freq: Four times a day (QID) | ORAL | Status: DC | PRN
  Administered 2018-04-07 – 2018-04-09 (×4): 650 mg via ORAL
  Filled 2018-04-06 (×5): qty 2

## 2018-04-06 MED ORDER — ACETAMINOPHEN 650 MG RE SUPP: 650.0000 mg | Freq: Four times a day (QID) | RECTAL | Status: DC | PRN

## 2018-04-06 MED ORDER — SODIUM CHLORIDE 0.9 % IV BOLUS (SEPSIS)
1000.0000 mL | Freq: Once | INTRAVENOUS | Status: AC
  Administered 2018-04-06: 1000 mL via INTRAVENOUS

## 2018-04-06 MED ORDER — SODIUM CHLORIDE 0.9 % IV BOLUS (SEPSIS)
500.0000 mL | Freq: Once | INTRAVENOUS | Status: AC
  Administered 2018-04-06: 500 mL via INTRAVENOUS

## 2018-04-06 MED ORDER — SODIUM CHLORIDE 0.9 % IV SOLN
2.0000 g | Freq: Once | INTRAVENOUS | Status: AC
  Administered 2018-04-06: 2 g via INTRAVENOUS
  Filled 2018-04-06: qty 2

## 2018-04-06 MED ORDER — IOHEXOL 300 MG/ML  SOLN
75.0000 mL | Freq: Once | INTRAMUSCULAR | Status: AC | PRN
  Administered 2018-04-06: 75 mL via INTRAVENOUS

## 2018-04-06 MED ORDER — SODIUM CHLORIDE 0.9 % IV SOLN: 2.0000 g | Freq: Once | INTRAVENOUS | Status: DC

## 2018-04-06 MED ORDER — METOCLOPRAMIDE HCL 5 MG/ML IJ SOLN
10.0000 mg | Freq: Once | INTRAMUSCULAR | Status: AC
  Administered 2018-04-06: 10 mg via INTRAVENOUS
  Filled 2018-04-06: qty 2

## 2018-04-06 MED ORDER — SODIUM CHLORIDE 0.9 % IV SOLN
INTRAVENOUS | Status: DC
  Administered 2018-04-06 – 2018-04-08 (×4): via INTRAVENOUS

## 2018-04-06 MED ORDER — MAGNESIUM SULFATE 2 GM/50ML IV SOLN
2.0000 g | Freq: Once | INTRAVENOUS | Status: AC
  Administered 2018-04-06: 2 g via INTRAVENOUS
  Filled 2018-04-06: qty 50

## 2018-04-06 MED ORDER — PROCHLORPERAZINE EDISYLATE 10 MG/2ML IJ SOLN
5.0000 mg | INTRAMUSCULAR | Status: DC | PRN
  Administered 2018-04-07: 5 mg via INTRAVENOUS
  Filled 2018-04-06: qty 2

## 2018-04-06 MED ORDER — IBUPROFEN 200 MG PO TABS
400.0000 mg | ORAL_TABLET | Freq: Once | ORAL | Status: AC
  Administered 2018-04-06: 400 mg via ORAL
  Filled 2018-04-06: qty 2

## 2018-04-06 MED ORDER — CIPROFLOXACIN IN D5W 400 MG/200ML IV SOLN
400.0000 mg | Freq: Two times a day (BID) | INTRAVENOUS | Status: DC
  Administered 2018-04-06 – 2018-04-09 (×6): 400 mg via INTRAVENOUS
  Filled 2018-04-06 (×6): qty 200

## 2018-04-06 MED ORDER — SODIUM CHLORIDE 0.9 % IV BOLUS
500.0000 mL | Freq: Once | INTRAVENOUS | Status: AC
  Administered 2018-04-06: 500 mL via INTRAVENOUS

## 2018-04-06 MED ORDER — FENTANYL CITRATE (PF) 100 MCG/2ML IJ SOLN
25.0000 ug | Freq: Once | INTRAMUSCULAR | Status: AC
  Administered 2018-04-06: 25 ug via INTRAVENOUS
  Filled 2018-04-06: qty 2

## 2018-04-06 NOTE — ED Notes (Signed)
Bed: WA24 Expected date:  Expected time:  Means of arrival:  Comments: EMS-N/V 

## 2018-04-06 NOTE — ED Notes (Signed)
Patient transported to CT 

## 2018-04-06 NOTE — ED Notes (Signed)
Patient family concern about patient left eye swelling and pt also c/o head.Patient had emesis medium amount bile in color. Dr. Dayna Barker aware.

## 2018-04-06 NOTE — ED Notes (Signed)
RN was called into pt room after patient's family noticed that the patient's blood pressure had dipped. RN let admitting doctor know about the decrease in BP and he gave verbal okay to increase fluids to give patient a 500 bolus.  Patient's family aware of plan of care.

## 2018-04-06 NOTE — Progress Notes (Signed)
Pharmacy Note   A consult was received from an ED physician for vancomycin and aztreonam per pharmacy dosing.  Per aztreonam, ok to switch to cefepime if pt has tolerated cephalosporins in the past.  Pt has tolerated multiple cephalosporins in the past including cefepime.     The patient's profile has been reviewed for ht/wt/allergies/indication/available labs.    A one time order has been placed for vancomycin 1000 mg IV x1 and cefepime 2 gr IV x1 .  Further antibiotics/pharmacy consults should be ordered by admitting physician if indicated.                       Thank you,  Royetta Asal, PharmD, BCPS Pager [phone] 04/06/2018 3:08 PM

## 2018-04-06 NOTE — ED Provider Notes (Signed)
4:41 PM Assumed care from Dr Zenia Resides, please see their note for full history, physical and decision making until this point. In brief this is a 85 y.o. year old female who presented to the ED tonight with Nausea (vomiting)     85 year old female here with worsening abdominal pain.  Abdominal exam, leukocytosis, tachycardia and obvious dehydration secondary to vomiting.  Concern for possible obstruction versus free air versus other intra-abdominal pathology.  Pending urinalysis and CT scan for disposition which is likely admission. Antibiotics given.  Urine clear. Ct w/ some pneumobilia c/w previous sphincterotomy. Also with diffuse colitis as likely cause for symptoms. Appropriate antibiotics given. Still tachy even with fluids, defeverscence and abx. Not tolerating PO, with consistent vomiting. Will admit to hospitalist for further management.   CRITICAL CARE Performed by: Merrily Pew Total critical care time: 35 minutes Critical care time was exclusive of separately billable procedures and treating other patients. Critical care was necessary to treat or prevent imminent or life-threatening deterioration. Critical care was time spent personally by me on the following activities: development of treatment plan with patient and/or surrogate as well as nursing, discussions with consultants, evaluation of patient's response to treatment, examination of patient, obtaining history from patient or surrogate, ordering and performing treatments and interventions, ordering and review of laboratory studies, ordering and review of radiographic studies, pulse oximetry and re-evaluation of patient's condition.   Labs, studies and imaging reviewed by myself and considered in medical decision making if ordered. Imaging interpreted by radiology.  Labs Reviewed  CBC WITH DIFFERENTIAL/PLATELET - Abnormal; Notable for the following components:      Result Value   WBC 13.1 (*)    RBC 5.29 (*)    Hemoglobin 16.0 (*)     HCT 51.0 (*)    Neutro Abs 11.7 (*)    All other components within normal limits  COMPREHENSIVE METABOLIC PANEL - Abnormal; Notable for the following components:   Glucose, Bld 168 (*)    All other components within normal limits  URINE CULTURE  CULTURE, BLOOD (ROUTINE X 2)  CULTURE, BLOOD (ROUTINE X 2)  LIPASE, BLOOD  LACTIC ACID, PLASMA  URINALYSIS, ROUTINE W REFLEX MICROSCOPIC    DG Abd Acute W/Chest  Final Result    CT Abdomen Pelvis W Contrast    (Results Pending)    No follow-ups on file.    Merrily Pew, MD 04/06/18 [phone]

## 2018-04-06 NOTE — ED Triage Notes (Signed)
Patient coming from home with c/o nausea and vomiting since this am.

## 2018-04-06 NOTE — ED Provider Notes (Signed)
[facility] DEPT Provider Note   CSN: [id number] Arrival date & time: 04/06/18  1311    History   Chief Complaint Chief Complaint  Patient presents with  . Nausea    vomiting    HPI [name] [name] is a 85 y.o. female.     85 year old female presents with 1 day history of abdominal discomfort with associated bilious vomiting.  He has a history of a cholecystectomy, hysterectomy as well as an appendectomy.  Review of the old chart shows that patient has been diagnosed recently constipation.  Denies any fever or chills.  No urinary symptoms.  No chest pain or chest pressure.  Symptoms have been persistent and nothing makes them better worse no treatment use prior to arrival.     Past Medical History:  Diagnosis Date  . Abdominal pain    Resolved  . Cardiac arrhythmia   . CHF (congestive heart failure) ([place])    Diastolic  on ECHO 4196  . Choledocholithiasis   . Chronic anemia   . Chronic back pain   . COPD (chronic obstructive pulmonary disease) (H[facility])    oxygen dependent at  night 2LPM  . Daily headache   . GERD (gastroesophageal reflux disease)   . HCAP (healthcare-associated pneumonia) 03/28/2014  . Hiatal hernia   . Hyperlipidemia   . Hypertension   . On home oxygen therapy    "1L during the day; 2L q hs" (03/28/2014)  . Osteoarthritis    "pretty much q where"   . Osteoporosis   . Pancolitis (H[facility])    Infectious vs. inflammatory  . Pancreatitis    History of  . Pneumonia 1930's; 2011 X 2; 02/2014  . PONV (postoperative nausea and vomiting)   . PUD (peptic ulcer disease)   . Reactive airway disease that is not asthma   . Recurrent UTI (urinary tract infection)    "here lately" (03/28/2014)  . Scoliosis deformity of spine    history of intractable back pain    Patient Active Problem List   Diagnosis Date Noted  . Protein-calorie malnutrition, severe 02/06/2018  . Colitis 02/03/2018  . CKD (chronic kidney disease), stage II  02/03/2018  . Nausea & vomiting 02/03/2018  . Abdominal pain 02/03/2018  . Chronic respiratory failure ([place]) 05/17/2015  . Sepsis ([place]) 11/09/2014  . Aspiration pneumonia ([place]) 11/09/2014  . Other allergic rhinitis 04/19/2014  . COPD (chronic obstructive pulmonary disease) ([place]) 04/19/2014  . Acute on chronic respiratory failure with hypoxia ([place])   . Other pancytopenia ([place])   . Headache(784.0) 08/11/2013  . Hypocalcemia 02/07/2013  . Memory change 09/28/2012  . Abdominal pain, chronic, epigastric 09/28/2012  . Chronic tension headaches 09/15/2012  . DDD (degenerative disc disease), lumbar 05/31/2012  . Vitamin D deficiency 08/17/2010  . Chronic back pain 06/02/2010  . PANCREATIC INSUFFICIENCY 03/19/2010  . ANEMIA 12/27/2009  . Hearing loss 08/17/2009  . Chronic pulmonary heart disease ([place]) 04/12/2009  . Osteoporosis 02/12/2009  . HYPERCHOLESTEROLEMIA 02/01/2009  . Essential hypertension 02/01/2009  . CARDIAC ARRHYTHMIA 02/01/2009  . Chronic diastolic CHF (congestive heart failure) ([place]) 02/01/2009  . COPD with emphysema, gold stage C. 02/01/2009  . GERD 02/01/2009  . Peptic ulcer 02/01/2009  . Diaphragmatic hernia 02/01/2009  . OSTEOARTHRITIS 02/01/2009    Past Surgical History:  Procedure Laterality Date  . ABDOMINAL HYSTERECTOMY  1970s  . BACK SURGERY     x 5  . BREAST CYST EXCISION Right 1960's  . CERVICAL LAMINECTOMY  1970s  . CHOLECYSTECTOMY  2008  . ERCP W/ SPHICTEROTOMY  02/2010  . ESOPHAGOGASTRODUODENOSCOPY N/A 11/14/2013   Procedure: ESOPHAGOGASTRODUODENOSCOPY (EGD);  Surgeon: Missy Sabins, MD;  Location: [facility] ENDOSCOPY;  Service: Endoscopy;  Laterality: N/A;  . FLEXIBLE SIGMOIDOSCOPY N/A 01/24/2013   Procedure: FLEXIBLE SIGMOIDOSCOPY;  Surgeon: Missy Sabins, MD;  Location: [facility];  Service: Endoscopy;  Laterality: N/A;  . PAIN PUMP IMPLANTATION  ~ 2010   back, dilaudid and bupravacaine  . SPINAL CORD STIMULATOR IMPLANT  ~ 2009  . SPINE SURGERY   [phone]     OB History   No obstetric history on file.      Home Medications    Prior to Admission medications   Medication Sig Start Date End Date Taking? Authorizing Provider  acetaminophen (TYLENOL) 500 MG tablet Take 500 mg by mouth 2 (two) times daily as needed for headache.    [provider]  albuterol (PROVENTIL HFA;VENTOLIN HFA) 108 (90 Base) MCG/ACT inhaler Inhale 1-2 puffs into the lungs every 6 (six) hours as needed (wheezing & shortness of breath). Reported on 02/28/2015 10/09/15   Debbrah Alar, NP  Ascorbic Acid (VITAMIN C) 500 MG tablet Take 500 mg by mouth daily.      [provider]  budesonide-formoterol (SYMBICORT) 160-4.5 MCG/ACT inhaler Inhale 2 puffs 2 (two) times daily into the lungs. 12/05/16   Parrett, Fonnie Mu, NP  butalbital-acetaminophen-caffeine (FIORICET, ESGIC) 50-325-40 MG tablet Take 1 tablet by mouth every 4 (four) hours as needed for headache.     [provider]  Calcium Carbonate-Vitamin D (CALCIUM 600-D) 600-400 MG-UNIT per tablet Take 1 tablet by mouth 2 (two) times daily with a meal.      [provider]  cholecalciferol (VITAMIN D) 1000 units tablet Take 1,000 Units by mouth daily.    [provider]  diclofenac sodium (VOLTAREN) 1 % GEL Place 1 application onto the skin 2 (two) times daily as needed. 06/06/15   [provider]  gabapentin (NEURONTIN) 100 MG capsule Take 1 capsule (100 mg total) by mouth 2 (two) times daily. 08/23/13   Debbrah Alar, NP  hyoscyamine (LEVSIN SL) 0.125 MG SL tablet Take 0.125 mg by mouth as needed. Abdominal pain 11/24/11   [provider]  lactulose (CHRONULAC) 10 GM/15ML solution TAKE 2 TEASPOONFUL (10ML) BY MOUTH AT BEDTIME AS NEEDED FOR CONSTIPATION 03/30/18   Debbrah Alar, NP  Multiple Vitamins-Minerals (MULTIVITAMIN WITH MINERALS) tablet Take 1 tablet by mouth daily. 07/01/15   Debbrah Alar, NP  nitroGLYCERIN (NITROSTAT) 0.4 MG  SL tablet Place 1 tablet (0.4 mg total) under the tongue every 5 (five) minutes as needed for chest pain. 11/11/10   Darlin Coco, MD  omeprazole (PRILOSEC) 40 MG capsule Take 1 capsule (40 mg total) by mouth 2 (two) times daily. 11/16/13   Thurnell Lose, MD  oxyCODONE-acetaminophen (PERCOCET) 7.5-325 MG tablet Take 1 tablet by mouth 2 (two) times daily as needed for severe pain.    [provider]  OXYGEN Inhale 2 L into the lungs as needed.    [provider]  Probiotic Product (PROBIOTIC DAILY PO) Take 1 tablet by mouth daily at 12 noon.    [provider]  promethazine (PHENERGAN) 25 MG suppository Place 1 suppository (25 mg total) rectally every 6 (six) hours as needed for nausea or vomiting. 02/03/18   Debbrah Alar, NP  VITAMIN E PO Take 1 tablet by mouth daily.    [provider]    Family History Family History  Problem  Relation Age of Onset  . Cancer Mother        uterine  . Heart disease Father   . Hypertension Other   . Osteoporosis Neg Hx     Social History Social History   Tobacco Use  . Smoking status: Former Smoker    Packs/day: 1.00    Years: 12.00    Pack years: 12.00    Types: Cigarettes    Last attempt to quit: 01/28/1988    Years since quitting: 30.2  . Smokeless tobacco: Never Used  Substance Use Topics  . Alcohol use: No  . Drug use: No     Allergies   Cefdinir; Codeine; Influenza vaccines; Morphine; Sulfa antibiotics; and Zoledronic acid   Review of Systems Review of Systems  All other systems reviewed and are negative.    Physical Exam Updated Vital Signs Ht 1.524 m (5')   Wt 45.4 kg   SpO2 94%   BMI 19.53 kg/m   Physical Exam Vitals signs and nursing note reviewed.  Constitutional:      General: She is not in acute distress.    Appearance: Normal appearance. She is well-developed. She is not toxic-appearing.  HENT:     Head: Normocephalic and atraumatic.  Eyes:     General: Lids are  normal.     Conjunctiva/sclera: Conjunctivae normal.     Pupils: Pupils are equal, round, and reactive to light.  Neck:     Musculoskeletal: Normal range of motion and neck supple.     Thyroid: No thyroid mass.     Trachea: No tracheal deviation.  Cardiovascular:     Rate and Rhythm: Normal rate and regular rhythm.     Heart sounds: Normal heart sounds. No murmur. No gallop.   Pulmonary:     Effort: Pulmonary effort is normal. No respiratory distress.     Breath sounds: Normal breath sounds. No stridor. No decreased breath sounds, wheezing, rhonchi or rales.  Abdominal:     General: Bowel sounds are normal. There is distension.     Palpations: Abdomen is soft.     Tenderness: There is generalized abdominal tenderness. There is guarding. There is no rebound.    Musculoskeletal: Normal range of motion.        General: No tenderness.  Skin:    General: Skin is warm and dry.     Findings: No abrasion or rash.  Neurological:     Mental Status: She is alert and oriented to person, place, and time.     GCS: GCS eye subscore is 4. GCS verbal subscore is 5. GCS motor subscore is 6.     Cranial Nerves: No cranial nerve deficit.     Sensory: No sensory deficit.  Psychiatric:        Speech: Speech normal.        Behavior: Behavior normal.      ED Treatments / Results  Labs (all labs ordered are listed, but only abnormal results are displayed) Labs Reviewed  URINE CULTURE  CBC WITH DIFFERENTIAL/PLATELET  COMPREHENSIVE METABOLIC PANEL  LIPASE, BLOOD  URINALYSIS, ROUTINE W REFLEX MICROSCOPIC  LACTIC ACID, PLASMA    EKG None  Radiology No results found.  Procedures Procedures (including critical care time)  Medications Ordered in ED Medications  sodium chloride 0.9 % bolus 1,000 mL (has no administration in time range)  0.9 %  sodium chloride infusion (has no administration in time range)  ondansetron (ZOFRAN) injection 4 mg (has no administration in time range)  fentaNYL (SUBLIMAZE) injection 25 mcg (has no administration in time range)     Initial Impression / Assessment and Plan / ED Course  I have reviewed the triage vital signs and the nursing notes.  Pertinent labs & imaging results that were available during my care of the patient were reviewed by me and considered in my medical decision making (see chart for details).        Patient given IV fluids and started on empiric antibiotics.  Abdominal CT is pending at this time care turned over to Dr. Dayna Barker  Final Clinical Impressions(s) / ED Diagnoses   Final diagnoses:  None    ED Discharge Orders    None       Lacretia Leigh, MD 04/06/18 1547

## 2018-04-06 NOTE — ED Notes (Signed)
This RN, EMT DJ, and RN [place] transferred patient to a hospital bed for comfort. Pt is expected to be holding in the ER until a bed is available on stepdown. RN provided family member for a recliner for comfort

## 2018-04-06 NOTE — H&P (Signed)
History and Physical    [name] [name] SNK:[id number] DOB: 1925-12-22 DOA: 04/06/2018  PCP: Debbrah Alar, NP   Patient coming from: Home.  I have personally briefly reviewed patient's old medical records in [place]  Chief Complaint: Nausea and vomiting.  HPI: [name] [name] is a 85 y.o. female with medical history significant of chronic abdominal pain, chronic diastolic CHF, choledocholithiasis, chronic anemia, chronic back pain, COPD on home oxygen, history of daily headaches, history of healthcare associated pneumonia, GERD, hiatal hernia, hyperlipidemia, hypertension, osteoarthritis, osteoporosis, history of pancolitis, history of pancreatitis, PUD, recurrent UTI, scoliosis who is being brought to the emergency department due to several episodes of nausea and emesis since this morning.  She is currently somnolent and unable to provide much information.  The history is taken from her daughters and granddaughter.  They stated that she was in her usual state of health yesterday.  She is normally very active and fully oriented.  Family also mentioned that she has had 7 previous sepsis episodes due to UTI.    ED Course: Initial temperature 100.1 F I, pulse 130, respirations 19, blood pressure 190/104 mmHg and O2 sat 93% on room air.  The patient received ibuprofen 400 mg p.o. x1, at least 3000 mL of NS bolus, Zofran 4 mg IVP x2, metronidazole, cefepime and vancomycin.  Urinalysis shows small hemoglobinuria, ketonuria of 5 and proteinuria of 30 mg/dL.  Other values were normal.  CBC shows a white count of 13.1 with 90% neutrophils, 6% lymphocytes and 4% monocytes.  Hemoglobin was 16.0 g/dL and platelets 265.  Lactic acid was normal.  Lipase was 29.  Her CMP had a glucose of 168 mg/dL, but all other values were normal.  Phosphorus was 3.0 and magnesium 1.6 mg/dL.  Blood cultures were drawn.  Imaging: her abdomen/chest radiograph show borderline gaseous distention of small bowel loops in  the central abdomen with associated mucosal thickening, small bowel ileus versus early incomplete small bowel obstruction.  CT abdomen and pelvis with contrast showed pneumobilia, usually associated with prior sphincterectomy, mild dilatation of the proximal jejunum with evidence of statuses and mild mucosal thickening suggestive of infectious/inflammatory changes.  There is mild nonspecific mucosal thickening of the redundant descending colon, likely due to colitis.  There is moderate amount of water density abdominal pelvic ascites.  Please see images and full radiology report for further detail  Review of Systems: As per HPI otherwise 10 point review of systems negative.   Past Medical History:  Diagnosis Date  . Abdominal pain    Resolved  . Cardiac arrhythmia   . CHF (congestive heart failure) ([place])    Diastolic  on ECHO 9379  . Choledocholithiasis   . Chronic anemia   . Chronic back pain   . COPD (chronic obstructive pulmonary disease) (HCC)    oxygen dependent at  night 2LPM  . Daily headache   . GERD (gastroesophageal reflux disease)   . HCAP (healthcare-associated pneumonia) 03/28/2014  . Hiatal hernia   . Hyperlipidemia   . Hypertension   . On home oxygen therapy    "1L during the day; 2L q hs" (03/28/2014)  . Osteoarthritis    "pretty much q where"   . Osteoporosis   . Pancolitis (HCC)    Infectious vs. inflammatory  . Pancreatitis    History of  . Pneumonia 1930's; 2011 X 2; 02/2014  . PONV (postoperative nausea and vomiting)   . PUD (peptic ulcer disease)   . Reactive airway disease that is  not asthma   . Recurrent UTI (urinary tract infection)    "here lately" (03/28/2014)  . Scoliosis deformity of spine    history of intractable back pain    Past Surgical History:  Procedure Laterality Date  . ABDOMINAL HYSTERECTOMY  1970s  . BACK SURGERY     x 5  . BREAST CYST EXCISION Right 1960's  . CERVICAL LAMINECTOMY  1970s  . CHOLECYSTECTOMY  2008  . ERCP W/  SPHICTEROTOMY  02/2010  . ESOPHAGOGASTRODUODENOSCOPY N/A 11/14/2013   Procedure: ESOPHAGOGASTRODUODENOSCOPY (EGD);  Surgeon: Missy Sabins, MD;  [facility] END[facility]COPY;  Service: Endoscopy;  Laterality: N/A;  . FLEXIBLE SIGMOIDOSCOPY N/A 01/24/2013   Procedure: FLEXIBLE SIGMOIDOSCOPY;  Surgeon: Missy Sabins, MD;  Location: [facility];  Service: Endoscopy;  Laterality: N/A;  . PAIN PUMP IMPLANTATION  ~ 2010   back, dilaudid and bupravacaine  . SPINAL CORD STIMULATOR IMPLANT  ~ 2009  . SPINE SURGERY  [phone]     reports that she quit smoking about 30 years ago. Her smoking use included cigarettes. She has a 12.00 pack-year smoking history. She has never used smokeless tobacco. She reports that she does not drink alcohol or use drugs.  Allergies  Allergen Reactions  . Cefdinir Diarrhea  . Codeine Diarrhea and Nausea And Vomiting    REACTION: n/v/d, HA  . Influenza Vaccines     Stomach pain, headache  . Morphine Diarrhea and Nausea And Vomiting    REACTION: n/v/d, HA  . Sulfa Antibiotics Nausea Only    Sick    . Zoledronic Acid Swelling    REACTION: Severe edema    Family History  Problem Relation Age of Onset  . Cancer Mother        uterine  . Heart disease Father   . Hypertension Other   . Osteoporosis Neg Hx    Prior to Admission medications   Medication Sig Start Date End Date Taking? Authorizing Provider  Ascorbic Acid (VITAMIN C) 500 MG tablet Take 500 mg by mouth daily.     Yes [provider]  Aspirin-Acetaminophen (GOODYS BODY PAIN PO) Take 1 packet by mouth 3 (three) times daily as needed (pain).    Yes [provider]  butalbital-acetaminophen-caffeine (FIORICET, ESGIC) 50-325-40 MG tablet Take 1 tablet by mouth every 4 (four) hours as needed for headache.    Yes [provider]  Calcium Carbonate-Vitamin D (CALCIUM 600-D) 600-400 MG-UNIT per tablet Take 1 tablet by mouth 2 (two) times daily with a meal.     Yes [provider]  cholecalciferol (VITAMIN D) 1000 units tablet Take 1,000 Units by mouth daily.   Yes [provider]  diclofenac sodium (VOLTAREN) 1 % GEL Place 1 application onto the skin 2 (two) times daily as needed. 06/06/15  Yes [provider]  hyoscyamine (LEVSIN SL) 0.125 MG SL tablet Take 0.125 mg by mouth as needed. Abdominal pain 11/24/11  Yes [provider]  lactulose (CHRONULAC) 10 GM/15ML solution TAKE 2 TEASPOONFUL (10ML) BY MOUTH AT BEDTIME AS NEEDED FOR CONSTIPATION 03/30/18  Yes Debbrah Alar, NP  Multiple Vitamins-Minerals (MULTIVITAMIN WITH MINERALS) tablet Take 1 tablet by mouth daily. 07/01/15  Yes Debbrah Alar, NP  omeprazole (PRILOSEC) 40 MG capsule Take 1 capsule (40 mg total) by mouth 2 (two) times daily. 11/16/13  Yes Thurnell Lose, MD  oxyCODONE-acetaminophen (PERCOCET) 7.5-325 MG tablet Take 1 tablet by mouth 2 (two) times daily as needed for severe pain.   Yes [provider]  Probiotic Product (PROBIOTIC DAILY PO) Take 1 tablet by mouth daily at 12 noon.   Yes [provider]  promethazine (PHENERGAN) 25 MG suppository Place 1 suppository (25 mg total) rectally every 6 (six) hours as needed for nausea or vomiting. 02/03/18  Yes Debbrah Alar, NP  acetaminophen (TYLENOL) 500 MG tablet Take 500 mg by mouth 2 (two) times daily as needed for headache.    [provider]  albuterol (PROVENTIL HFA;VENTOLIN HFA) 108 (90 Base) MCG/ACT inhaler Inhale 1-2 puffs into the lungs every 6 (six) hours as needed (wheezing & shortness of breath). Reported on 02/28/2015 10/09/15   Debbrah Alar, NP  budesonide-formoterol [facility]) 160-4.5 MCG/ACT inhaler Inhale 2 puffs 2 (two) times daily into the lungs. 12/05/16   Parrett, Fonnie Mu, NP  gabapentin (NEURONTIN) 100 MG capsule Take 1 capsule (100 mg total) by mouth 2 (two) times daily. Patient not taking: Reported on 04/06/2018 08/23/13   Debbrah Alar, NP    nitroGLYCERIN (NITROSTAT) 0.4 MG SL tablet Place 1 tablet (0.4 mg total) under the tongue every 5 (five) minutes as needed for chest pain. 11/11/10   Darlin Coco, MD  OXYGEN Inhale 2 L into the lungs as needed.    [provider]  VITAMIN E PO Take 1 tablet by mouth daily.    [provider]    Physical Exam: Vitals:   04/06/18 1700 04/06/18 1725 04/06/18 1843 04/06/18 1844  BP: (!) 193/103 (!) 194/97 (!) 192/100 136/85  Pulse: (!) 144 (!) 121 (!) 114 81  Resp: (!) 27 16 17 15   Temp:      TempSrc:      SpO2: 99% 98% 99% 100%  Weight:      Height:        Constitutional: Frail, elderly female, looks acutely ill. Eyes: PERRL, lids and conjunctivae normal ENMT: Mucous membranes and lips are dry. Posterior pharynx clear of any exudate or lesions. Neck: normal, supple, no masses, no thyromegaly Respiratory: Decreased breath sounds on bases, otherwise clear to auscultation bilaterally, no wheezing, no crackles. Normal respiratory effort. No accessory muscle use.  Cardiovascular: Tachycardic at 116 bpm, no murmurs / rubs / gallops. No extremity edema. 2+ pedal pulses. No carotid bruits.  Abdomen: Soft, mild LLQ tenderness, no masses palpated. No hepatosplenomegaly. Bowel sounds positive.  Musculoskeletal: no clubbing / cyanosis.  Good ROM, no contractures. Normal muscle tone.  Skin: Positive frontal area abrasion. Neurologic: CN 2-12 grossly seem to be intact.  Moves all extremities. Psychiatric: Somnolent.  Wakes up briefly.  Labs on Admission: I have personally reviewed following labs and imaging studies  CBC: Recent Labs  Lab 04/06/18 1419  WBC 13.1*  NEUTROABS 11.7*  HGB 16.0*  HCT 51.0*  MCV 96.4  PLT 458   Basic Metabolic Panel: Recent Labs  Lab 04/06/18 1419 04/06/18 1435  NA 140  --   K 3.9  --   CL 99  --   CO2 29  --   GLUCOSE 168*  --   BUN 20  --   CREATININE 0.84  --   CALCIUM 9.2  --   MG  --  1.6*  PHOS  --  3.0    GFR: Estimated Creatinine Clearance: 31.9 mL/min (by C-G formula based on SCr of 0.84 mg/dL). Liver Function Tests: Recent Labs  Lab 04/06/18 1419  AST 26  ALT 14  ALKPHOS 80  BILITOT 0.4  PROT 6.9  ALBUMIN 3.8   Recent Labs  Lab 04/06/18 1419  LIPASE 29  No results for input(s): AMMONIA in the last 168 hours. Coagulation Profile: No results for input(s): INR, PROTIME in the last 168 hours. Cardiac Enzymes: No results for input(s): CKTOTAL, CKMB, CKMBINDEX, TROPONINI in the last 168 hours. BNP (last 3 results) No results for input(s): PROBNP in the last 8760 hours. HbA1C: No results for input(s): HGBA1C in the last 72 hours. CBG: No results for input(s): GLUCAP in the last 168 hours. Lipid Profile: No results for input(s): CHOL, HDL, LDLCALC, TRIG, CHOLHDL, LDLDIRECT in the last 72 hours. Thyroid Function Tests: No results for input(s): TSH, T4TOTAL, FREET4, T3FREE, THYROIDAB in the last 72 hours. Anemia Panel: No results for input(s): VITAMINB12, FOLATE, FERRITIN, TIBC, IRON, RETICCTPCT in the last 72 hours. Urine analysis:    Component Value Date/Time   COLORURINE YELLOW 04/06/2018 [place] 04/06/2018 1748   LABSPEC 1.021 04/06/2018 1748   PHURINE 5.0 04/06/2018 1748   GLUCOSEU NEGATIVE 04/06/2018 1748   GLUCOSEU NEGATIVE 03/22/2018 1323   HGBUR SMALL (A) 04/06/2018 1748   BILIRUBINUR NEGATIVE 04/06/2018 1748   BILIRUBINUR 1+ 02/16/2018 1234   KETONESUR 5 (A) 04/06/2018 1748   PROTEINUR 30 (A) 04/06/2018 1748   UROBILINOGEN 0.2 03/22/2018 1323   NITRITE NEGATIVE 04/06/2018 1748   LEUKOCYTESUR NEGATIVE 04/06/2018 1748    Radiological Exams on Admission: Ct Abdomen Pelvis W Contrast  Result Date: 04/06/2018 CLINICAL DATA:  Abdominal distension and bilious vomiting. EXAM: CT ABDOMEN AND PELVIS WITH CONTRAST TECHNIQUE: Multidetector CT imaging of the abdomen and pelvis was performed using the standard protocol following bolus administration  of intravenous contrast. CONTRAST:  7mL OMNIPAQUE IOHEXOL 300 MG/ML  SOLN COMPARISON:  Abdominal radiograph 04/06/2018 FINDINGS: Lower chest: Calcific atherosclerotic disease of the aorta. Small hiatal hernia. Improving nodular opacities in the lingula. Hepatobiliary: Moderate amount of pneumobilia. No definite liver masses. Peripheral dilation of the intrahepatic biliary ducts. Post cholecystectomy. Pancreas: Unremarkable. No pancreatic ductal dilatation or surrounding inflammatory changes. Spleen: Normal in size without focal abnormality. Adrenals/Urinary Tract: Adrenal glands are unremarkable. Kidneys are normal, without renal calculi, focal lesion, or hydronephrosis. Bladder is unremarkable. Stomach/Bowel: Normal appearance of the stomach and duodenum. Mild dilation of the proximal jejunum measuring up to 3.4 cm with evidence of stasis and mild mucosal thickening. The distal small bowel for returns to normal caliber. Formed stool throughout the colon mild nonspecific mucosal thickening of the redundant descending colon. Vascular/Lymphatic: Aortic atherosclerosis and tortuosity of the aorta. No enlarged abdominal or pelvic lymph nodes. Reproductive: Status post hysterectomy. No adnexal masses. Other: Moderate amount of water density abdominopelvic ascites. Musculoskeletal: Dextroconvex scoliosis of the lumbosacral spine with advanced osteoarthritic changes. Spinal stimulator in place. IMPRESSION: 1. Pneumobilia, usually associated with prior sphincterotomy, stable. Please correlate clinically. 2. Mild dilation of the proximal jejunum with evidence of stasis and mild mucosal thickening, suggestive of infectious/inflammatory changes. 3. Mild nonspecific mucosal thickening of the redundant descending colon, likely due to colitis. 4. Moderate amount of water density abdominopelvic ascites, new from 02/03/2018. 5. Small hiatal hernia. Electronically Signed   By: Fidela Salisbury M.D.   On: 04/06/2018 17:35   Dg  Abd Acute W/chest  Result Date: 04/06/2018 CLINICAL DATA:  Nausea vomiting. EXAM: DG ABDOMEN ACUTE W/ 1V CHEST COMPARISON:  03/22/2018 FINDINGS: Borderline gaseous distension of small bowel loops in the central abdomen with associated mucosal thickening. Moderate large amount of formed stool throughout the colon with preserved colonic bowel gas pattern. Question sliver of free gas outlines the liver shadow. Spinal stimulators noted.  Post cholecystectomy.  Normal cardiac silhouette. Calcific atherosclerotic disease of the aorta. No evidence of airspace consolidation. IMPRESSION: 1. Borderline gaseous distension of small bowel loops in the central abdomen with associated mucosal thickening, small bowel ileus versus early/incomplete small bowel obstruction. 2. Constipation. 3. Question sliver of free intra-abdominal gas. Confirmation with decubital radiograph, left-side-down may be considered. Electronically Signed   By: Fidela Salisbury M.D.   On: 04/06/2018 16:35    EKG: Independently reviewed.  Vent. rate 121 BPM PR interval * ms QRS duration 85 ms QT/QTc 319/453 ms P-R-T axes 93 82 60 Sinus tachycardia Supraventricular bigeminy Borderline right axis deviation Minimal ST depression, inferior leads  Assessment/Plan Principal Problem:   Acute colitis   Sepsis due to undetermined organism ([place]) Admit to stepdown/inpatient. Keep n.p.o. Continue IV fluids. Ciprofloxacin 400 mg IVPB every 12 hours. Metronidazole 500 mg IVPB every 8 hours. Antiemetics as needed. Consider surgery evaluation if it worsens. Prognosis is guarded.  Active Problems:   Abdominal distension Ileus versus early obstruction. She has not vomited any further. Discussed with the family that she will need NGT placement if distention worsens or emesis recurs.  Her family voiced understanding.    Hypomagnesemia Replaced.    Hyperglycemia Follow-up fasting glucose in a.m. Further work-up depending on results.     HYPERCHOLESTEROLEMIA Not on prescription medication. Continue lifestyle modifications    Chronic diastolic CHF (congestive heart failure) (HCC) Lower extremity edema, monitor intake and output.    GERD IV Protonix 40 mg every 24 hours while n.p.o.    COPD (chronic obstructive pulmonary disease) (HCC) Continue supplemental oxygen. Bronchodilators as needed.    Polycythemia Likely hemoconcentration. Continue IV fluids. Follow-up CBC.   DVT prophylaxis: SCDs. Code Status: Full code. Family Communication: Her daughters and granddaughter were present in the ED room. Disposition Plan: Admit for IV hydration and antibiotic therapy. Consults called:  Admission status: Observation/stepdown.   Reubin Milan MD [facility]  04/06/2018, 8:45 PM   This document was prepared using Dragon voice recognition software and may contain some unintended transcription errors.

## 2018-04-07 DIAGNOSIS — R1030 Lower abdominal pain, unspecified: Secondary | ICD-10-CM | POA: Diagnosis not present

## 2018-04-07 DIAGNOSIS — A419 Sepsis, unspecified organism: Secondary | ICD-10-CM | POA: Diagnosis not present

## 2018-04-07 DIAGNOSIS — I5032 Chronic diastolic (congestive) heart failure: Secondary | ICD-10-CM | POA: Diagnosis not present

## 2018-04-07 DIAGNOSIS — R140 Abdominal distension (gaseous): Secondary | ICD-10-CM | POA: Diagnosis present

## 2018-04-07 DIAGNOSIS — E43 Unspecified severe protein-calorie malnutrition: Secondary | ICD-10-CM | POA: Diagnosis not present

## 2018-04-07 DIAGNOSIS — E876 Hypokalemia: Secondary | ICD-10-CM | POA: Diagnosis present

## 2018-04-07 DIAGNOSIS — Z87440 Personal history of urinary (tract) infections: Secondary | ICD-10-CM | POA: Diagnosis not present

## 2018-04-07 DIAGNOSIS — Z681 Body mass index (BMI) 19 or less, adult: Secondary | ICD-10-CM | POA: Diagnosis not present

## 2018-04-07 DIAGNOSIS — K56609 Unspecified intestinal obstruction, unspecified as to partial versus complete obstruction: Secondary | ICD-10-CM | POA: Diagnosis not present

## 2018-04-07 DIAGNOSIS — J449 Chronic obstructive pulmonary disease, unspecified: Secondary | ICD-10-CM | POA: Diagnosis present

## 2018-04-07 DIAGNOSIS — M419 Scoliosis, unspecified: Secondary | ICD-10-CM | POA: Diagnosis present

## 2018-04-07 DIAGNOSIS — K219 Gastro-esophageal reflux disease without esophagitis: Secondary | ICD-10-CM | POA: Diagnosis not present

## 2018-04-07 DIAGNOSIS — R188 Other ascites: Secondary | ICD-10-CM | POA: Diagnosis not present

## 2018-04-07 DIAGNOSIS — R5381 Other malaise: Secondary | ICD-10-CM | POA: Diagnosis present

## 2018-04-07 DIAGNOSIS — K529 Noninfective gastroenteritis and colitis, unspecified: Secondary | ICD-10-CM | POA: Diagnosis not present

## 2018-04-07 DIAGNOSIS — K5289 Other specified noninfective gastroenteritis and colitis: Secondary | ICD-10-CM | POA: Diagnosis not present

## 2018-04-07 DIAGNOSIS — E7800 Pure hypercholesterolemia, unspecified: Secondary | ICD-10-CM | POA: Diagnosis present

## 2018-04-07 DIAGNOSIS — M810 Age-related osteoporosis without current pathological fracture: Secondary | ICD-10-CM | POA: Diagnosis present

## 2018-04-07 DIAGNOSIS — D751 Secondary polycythemia: Secondary | ICD-10-CM | POA: Diagnosis present

## 2018-04-07 DIAGNOSIS — R112 Nausea with vomiting, unspecified: Secondary | ICD-10-CM | POA: Diagnosis not present

## 2018-04-07 DIAGNOSIS — R1110 Vomiting, unspecified: Secondary | ICD-10-CM | POA: Diagnosis not present

## 2018-04-07 DIAGNOSIS — E860 Dehydration: Secondary | ICD-10-CM | POA: Diagnosis present

## 2018-04-07 DIAGNOSIS — J9610 Chronic respiratory failure, unspecified whether with hypoxia or hypercapnia: Secondary | ICD-10-CM | POA: Diagnosis not present

## 2018-04-07 DIAGNOSIS — Z8711 Personal history of peptic ulcer disease: Secondary | ICD-10-CM | POA: Diagnosis not present

## 2018-04-07 DIAGNOSIS — R739 Hyperglycemia, unspecified: Secondary | ICD-10-CM | POA: Diagnosis present

## 2018-04-07 DIAGNOSIS — I110 Hypertensive heart disease with heart failure: Secondary | ICD-10-CM | POA: Diagnosis not present

## 2018-04-07 DIAGNOSIS — E785 Hyperlipidemia, unspecified: Secondary | ICD-10-CM | POA: Diagnosis not present

## 2018-04-07 DIAGNOSIS — G8929 Other chronic pain: Secondary | ICD-10-CM | POA: Diagnosis not present

## 2018-04-07 DIAGNOSIS — R933 Abnormal findings on diagnostic imaging of other parts of digestive tract: Secondary | ICD-10-CM | POA: Diagnosis not present

## 2018-04-07 LAB — COMPREHENSIVE METABOLIC PANEL
ALT: 8 U/L (ref 0–44)
AST: 15 U/L (ref 15–41)
Albumin: 2.4 g/dL — ABNORMAL LOW (ref 3.5–5.0)
Alkaline Phosphatase: 42 U/L (ref 38–126)
Anion gap: 3 — ABNORMAL LOW (ref 5–15)
BUN: 15 mg/dL (ref 8–23)
CO2: 26 mmol/L (ref 22–32)
Calcium: 6.5 mg/dL — ABNORMAL LOW (ref 8.9–10.3)
Chloride: 112 mmol/L — ABNORMAL HIGH (ref 98–111)
Creatinine, Ser: 0.68 mg/dL (ref 0.44–1.00)
GFR calc Af Amer: >60 mL/min (ref >60)
GFR calc non Af Amer: >60 mL/min (ref >60)
Glucose, Bld: 94 mg/dL (ref 70–99)
Potassium: 3.6 mmol/L (ref 3.5–5.1)
Sodium: 141 mmol/L (ref 135–145)
Total Bilirubin: 0.4 mg/dL (ref 0.3–1.2)
Total Protein: 4.4 g/dL — ABNORMAL LOW (ref 6.5–8.1)

## 2018-04-07 LAB — CBC
HCT: 34.9 % — ABNORMAL LOW (ref 36.0–46.0)
Hemoglobin: 10.5 g/dL — ABNORMAL LOW (ref 12.0–15.0)
MCH: 30.3 pg (ref 26.0–34.0)
MCHC: 30.1 g/dL (ref 30.0–36.0)
MCV: 100.9 fL — ABNORMAL HIGH (ref 80.0–100.0)
Platelets: 122 10*3/uL — ABNORMAL LOW (ref 150–400)
RBC: 3.46 MIL/uL — ABNORMAL LOW (ref 3.87–5.11)
RDW: 15.2 % (ref 11.5–15.5)
WBC: 3.8 10*3/uL — ABNORMAL LOW (ref 4.0–10.5)
nRBC: 0 % (ref 0.0–0.2)

## 2018-04-07 MED ORDER — OXYCODONE-ACETAMINOPHEN 7.5-325 MG PO TABS
1.0000 | ORAL_TABLET | Freq: Once | ORAL | Status: AC
  Administered 2018-04-07: 1 via ORAL
  Filled 2018-04-07: qty 1

## 2018-04-07 MED ORDER — PANTOPRAZOLE SODIUM 40 MG IV SOLR
40.0000 mg | Freq: Every day | INTRAVENOUS | Status: DC
  Administered 2018-04-07 – 2018-04-08 (×3): 40 mg via INTRAVENOUS
  Filled 2018-04-07 (×3): qty 40

## 2018-04-07 MED ORDER — MAGNESIUM SULFATE 2 GM/50ML IV SOLN
2.0000 g | Freq: Once | INTRAVENOUS | Status: AC
  Administered 2018-04-07: 2 g via INTRAVENOUS
  Filled 2018-04-07: qty 50

## 2018-04-07 MED ORDER — SODIUM CHLORIDE 0.9 % IV BOLUS: 500.0000 mL | Freq: Once | INTRAVENOUS | Status: DC

## 2018-04-07 MED ORDER — PROMETHAZINE HCL 25 MG RE SUPP
25.0000 mg | Freq: Four times a day (QID) | RECTAL | Status: DC | PRN
  Filled 2018-04-07: qty 1

## 2018-04-07 MED ORDER — POTASSIUM CHLORIDE 10 MEQ/100ML IV SOLN
10.0000 meq | INTRAVENOUS | Status: AC
  Administered 2018-04-07 (×2): 10 meq via INTRAVENOUS
  Filled 2018-04-07 (×2): qty 100

## 2018-04-07 MED ORDER — CALCIUM GLUCONATE-NACL 1-0.675 GM/50ML-% IV SOLN
1.0000 g | Freq: Once | INTRAVENOUS | Status: AC
  Administered 2018-04-07: 1000 mg via INTRAVENOUS
  Filled 2018-04-07: qty 50

## 2018-04-07 MED ORDER — CALCIUM GLUCONATE 10 % IV SOLN: 1.0000 g | Freq: Once | INTRAVENOUS | Status: DC

## 2018-04-07 NOTE — ED Notes (Signed)
Attempted to give report to Mel Almond RN, RN states she has paged hospitalist to see if pt can be downgraded to tele. And would like to hold off on report until pt is revaluated for bed placement.

## 2018-04-07 NOTE — Consult Note (Signed)
Reason for Consult: SBO versus ileus Referring Physician: Dr. Jacki Cones Chief complaint: Nausea and vomiting PCP: Debbrah Alar, NP  [name] [name] is an 85 y.o. female.   HPI: Patient is a 85 year old female who presented to the ED complaining of nausea and vomiting symptoms started about 4 AM yesterday.  She had a bowel movement on Monday 3/9, but has not had one since.  She has had several episodes of enteritis.  Her daughter says she gets admitted every 7 or 8 months, but her last admission was in January.  She was treated for gastroenteritis and aspiration pneumonia at that time.  Currently her symptoms are much better.  She has had 3  prior surgeries, they include a cholecystectomy, appendectomy, and hysterectomy.  Work-up in the ED shows a low-grade temperature 100.1 on admission.  She is tachycardic and hypertensive.  Sats are good on nasal cannula.  Initial labs last evening show a glucose is elevated at 168 but CMP is otherwise normal.  WBC is 13.1, hemoglobin 16, hematocrit 51, platelets 265,000.  Urinalysis shows some bacteria, nitrate negative, 6-10 red cells 0-5 white cells per high-powered field.  CT scan shows mild dilatation of the proximal jejunum with evidence of stasis and mild mucosal thickening suggestive of an infectious/inflammatory changes.  Mild nonspecific mucosal thickening in the descending colon likely due to colitis.  Moderate amount of water density and abdominal pelvic ascites.    The patient just looks tired and worn out.  She is having no further nausea no significant abdominal distention.  She has chronic constipation and is on lactulose daily.  She normally has a bowel movement just about every day with the lactulose.  Her last bowel movement was on 04/05/2018.  Past Medical History:  Diagnosis Date  . Abdominal pain    Resolved  . Cardiac arrhythmia   . CHF (congestive heart failure) ([place])    Diastolic  on ECHO 8657  . Choledocholithiasis   .  Chronic anemia   . Chronic back pain   . COPD (chronic obstructive pulmonary disease) (HCC)    oxygen dependent at  night 2LPM  . Daily headache   . GERD (gastroesophageal reflux disease)   . HCAP (healthcare-associated pneumonia) 03/28/2014  . Hiatal hernia   . Hyperlipidemia   . Hypertension   . On home oxygen therapy    "1L during the day; 2L q hs" (03/28/2014)  . Osteoarthritis    "pretty much q where"   . Osteoporosis   . Pancolitis (HCC)    Infectious vs. inflammatory  . Pancreatitis    History of  . Pneumonia 1930's; 2011 X 2; 02/2014  . PONV (postoperative nausea and vomiting)   . PUD (peptic ulcer disease)   . Reactive airway disease that is not asthma   . Recurrent UTI (urinary tract infection)    "here lately" (03/28/2014)  . Scoliosis deformity of spine    history of intractable back pain    Past Surgical History:  Procedure Laterality Date  . ABDOMINAL HYSTERECTOMY  1970s  . BACK SURGERY     x 5  . BREAST CYST EXCISION Right 1960's  . CERVICAL LAMINECTOMY  1970s  . CHOLECYSTECTOMY  2008  . ERCP W/ SPHICTEROTOMY  02/2010  . ESOPHAGOGASTRODUODENOSCOPY N/A 11/14/2013   Procedure: ESOPHAGOGASTRODUODENOSCOPY (EGD);  Surgeon: Missy Sabins, MD;  Location: [facility] ENDOSCOPY;  Service: Endoscopy;  Laterality: N/A;  . FLEXIBLE SIGMOIDOSCOPY N/A 01/24/2013   Procedure: FLEXIBLE SIGMOIDOSCOPY;  Surgeon: Missy Sabins, MD;  Location: [facility] ENDOSCOPY;  Service: Endoscopy;  Laterality: N/A;  . PAIN PUMP IMPLANTATION  ~ 2010   back, dilaudid and bupravacaine  . SPINAL CORD STIMULATOR IMPLANT  ~ 2009  . SPINE SURGERY  1970's,1995,2007    Family History  Problem Relation Age of Onset  . Cancer Mother        uterine  . Heart disease Father   . Hypertension Other   . Osteoporosis Neg Hx     Social History:  reports that she quit smoking about 30 years ago. Her smoking use included cigarettes. She has a 12.00 pack-year smoking history. She has never used smokeless tobacco. She  reports that she does not drink alcohol or use drugs.  Allergies:  Allergies  Allergen Reactions  . Cefdinir Diarrhea  . Codeine Diarrhea and Nausea And Vomiting    REACTION: n/v/d, HA  . Influenza Vaccines     Stomach pain, headache  . Morphine Diarrhea and Nausea And Vomiting    REACTION: n/v/d, HA  . Sulfa Antibiotics Nausea Only    Sick    . Zoledronic Acid Swelling    REACTION: Severe edema    Medications:  Prior to Admission: (Not in a hospital admission)  Scheduled: . denosumab  60 mg Subcutaneous Once  . pantoprazole (PROTONIX) IV  40 mg Intravenous QHS   Continuous: . sodium chloride 75 mL/hr at 04/07/18 0142  . ciprofloxacin Stopped (04/06/18 2210)  . metronidazole 500 mg (04/07/18 0905)  . sodium chloride     Anti-infectives (From admission, onward)   Start     Dose/Rate Route Frequency Ordered Stop   04/06/18 2359  metroNIDAZOLE (FLAGYL) IVPB 500 mg     500 mg 100 mL/hr over 60 Minutes Intravenous Every 8 hours 04/06/18 2005     04/06/18 2030  ciprofloxacin (CIPRO) IVPB 400 mg     400 mg 200 mL/hr over 60 Minutes Intravenous Every 12 hours 04/06/18 2006     04/06/18 1515  aztreonam (AZACTAM) 2 g in sodium chloride 0.9 % 100 mL IVPB  Status:  Discontinued     2 g 200 mL/hr over 30 Minutes Intravenous  Once 04/06/18 1501 04/06/18 1507   04/06/18 1515  metroNIDAZOLE (FLAGYL) IVPB 500 mg     500 mg 100 mL/hr over 60 Minutes Intravenous  Once 04/06/18 1501 04/06/18 1740   04/06/18 1515  vancomycin (VANCOCIN) IVPB 1000 mg/200 mL premix     1,000 mg 200 mL/hr over 60 Minutes Intravenous  Once 04/06/18 1501 04/06/18 1838   04/06/18 1515  ceFEPIme (MAXIPIME) 2 g in sodium chloride 0.9 % 100 mL IVPB     2 g 200 mL/hr over 30 Minutes Intravenous  Once 04/06/18 1508 04/06/18 1624      Results for orders placed or performed during the hospital encounter of 04/06/18 (from the past 48 hour(s))  CBC with Differential/Platelet     Status: Abnormal   Collection  Time: 04/06/18  2:19 PM  Result Value Ref Range   WBC 13.1 (H) 4.0 - 10.5 K/uL   RBC 5.29 (H) 3.87 - 5.11 MIL/uL   Hemoglobin 16.0 (H) 12.0 - 15.0 g/dL   HCT 51.0 (H) 36.0 - 46.0 %   MCV 96.4 80.0 - 100.0 fL   MCH 30.2 26.0 - 34.0 pg   MCHC 31.4 30.0 - 36.0 g/dL   RDW 14.7 11.5 - 15.5 %   Platelets 265 150 - 400 K/uL   nRBC 0.0 0.0 - 0.2 %   Neutrophils Relative %  90 %   Neutro Abs 11.7 (H) 1.7 - 7.7 K/uL   Lymphocytes Relative 6 %   Lymphs Abs 0.7 0.7 - 4.0 K/uL   Monocytes Relative 4 %   Monocytes Absolute 0.6 0.1 - 1.0 K/uL   Eosinophils Relative 0 %   Eosinophils Absolute 0.0 0.0 - 0.5 K/uL   Basophils Relative 0 %   Basophils Absolute 0.0 0.0 - 0.1 K/uL   Immature Granulocytes 0 %   Abs Immature Granulocytes 0.05 0.00 - 0.07 K/uL    Comment: Performed at [facility], [place] [address] [place], [place] [address]  Comprehensive metabolic panel     Status: Abnormal   Collection Time: 04/06/18  2:19 PM  Result Value Ref Range   Sodium 140 135 - 145 mmol/L   Potassium 3.9 3.5 - 5.1 mmol/L   Chloride 99 98 - 111 mmol/L   CO2 29 22 - 32 mmol/L   Glucose, Bld 168 (H) 70 - 99 mg/dL   BUN 20 8 - 23 mg/dL   Creatinine, Ser 0.84 0.44 - 1.00 mg/dL   Calcium 9.2 8.9 - 10.3 mg/dL   Total Protein 6.9 6.5 - 8.1 g/dL   Albumin 3.8 3.5 - 5.0 g/dL   AST 26 15 - 41 U/L   ALT 14 0 - 44 U/L   Alkaline Phosphatase 80 38 - 126 U/L   Total Bilirubin 0.4 0.3 - 1.2 mg/dL   GFR calc non Af Amer >60 >60 mL/min   GFR calc Af Amer >60 >60 mL/min   Anion gap 12 5 - 15    Comment: Performed at [facility], [place] [address] [place], [place] [address]  Lipase, blood     Status: None   Collection Time: 04/06/18  2:19 PM  Result Value Ref Range   Lipase 29 11 - 51 U/L    Comment: Performed at [facility], [place] [address] [place], [place] [address]  Lactic acid, plasma     Status: None   Collection Time: 04/06/18  2:19 PM  Result Value Ref  Range   Lactic Acid, Venous 1.9 0.5 - 1.9 mmol/L    Comment: Performed at [facility], [place] [address], [place], [place] [address]  Blood Culture (routine x 2)     Status: None (Preliminary result)   Collection Time: 04/06/18  2:19 PM  Result Value Ref Range   Specimen Description      BLOOD LEFT HAND Performed at [facility] [address], [place], [place] [address]    Special Requests      BOTTLES DRAWN AEROBIC ONLY Blood Culture adequate volume Performed at [facility] [address], [place], [place] [address]    Culture      NO GROWTH < 12 HOURS Performed at [facility] [address] [place], [place] [address]    Report Status PENDING   Blood Culture (routine x 2)     Status: None (Preliminary result)   Collection Time: 04/06/18  2:19 PM  Result Value Ref Range   Specimen Description      BLOOD LEFT ANTECUBITAL Performed at [facility] [address] [place], [place] [address]    Special Requests      BOTTLES DRAWN AEROBIC AND ANAEROBIC Blood Culture adequate volume Performed at [facility] [address] [place], [place] [address]    Culture      NO GROWTH <  12 HOURS Performed at [facility], [place] [address] [place], [place] [address]    Report Status PENDING   Magnesium     Status: Abnormal   Collection Time: 04/06/18  2:35 PM  Result Value Ref Range   Magnesium 1.6 (L) 1.7 - 2.4 mg/dL    Comment: Performed at [facility], [place] [address] [place], [place] [address]  Phosphorus     Status: None   Collection Time: 04/06/18  2:35 PM  Result Value Ref Range   Phosphorus 3.0 2.5 - 4.6 mg/dL    Comment: Performed at [facility], [place] [address], [place], [place] [address]  Urinalysis, Routine w reflex microscopic     Status: Abnormal   Collection Time: 04/06/18  5:48 PM  Result Value Ref Range   Color, Urine  YELLOW YELLOW   APPearance CLEAR CLEAR   Specific Gravity, Urine 1.021 1.005 - 1.030   pH 5.0 5.0 - 8.0   Glucose, UA NEGATIVE NEGATIVE mg/dL   Hgb urine dipstick SMALL (A) NEGATIVE   Bilirubin Urine NEGATIVE NEGATIVE   Ketones, ur 5 (A) NEGATIVE mg/dL   Protein, ur 30 (A) NEGATIVE mg/dL   Nitrite NEGATIVE NEGATIVE   Leukocytes,Ua NEGATIVE NEGATIVE   RBC / HPF 6-10 0 - 5 RBC/hpf   WBC, UA 0-5 0 - 5 WBC/hpf   Bacteria, UA NONE SEEN NONE SEEN   Mucus PRESENT    Hyaline Casts, UA PRESENT     Comment: Performed at [facility], [place] [address], [place], [place] [address]    Ct Abdomen Pelvis W Contrast  Result Date: 04/06/2018 CLINICAL DATA:  Abdominal distension and bilious vomiting. EXAM: CT ABDOMEN AND PELVIS WITH CONTRAST TECHNIQUE: Multidetector CT imaging of the abdomen and pelvis was performed using the standard protocol following bolus administration of intravenous contrast. CONTRAST:  70mL OMNIPAQUE IOHEXOL 300 MG/ML  SOLN COMPARISON:  Abdominal radiograph 04/06/2018 FINDINGS: Lower chest: Calcific atherosclerotic disease of the aorta. Small hiatal hernia. Improving nodular opacities in the lingula. Hepatobiliary: Moderate amount of pneumobilia. No definite liver masses. Peripheral dilation of the intrahepatic biliary ducts. Post cholecystectomy. Pancreas: Unremarkable. No pancreatic ductal dilatation or surrounding inflammatory changes. Spleen: Normal in size without focal abnormality. Adrenals/Urinary Tract: Adrenal glands are unremarkable. Kidneys are normal, without renal calculi, focal lesion, or hydronephrosis. Bladder is unremarkable. Stomach/Bowel: Normal appearance of the stomach and duodenum. Mild dilation of the proximal jejunum measuring up to 3.4 cm with evidence of stasis and mild mucosal thickening. The distal small bowel for returns to normal caliber. Formed stool throughout the colon mild nonspecific mucosal thickening of the redundant descending colon.  Vascular/Lymphatic: Aortic atherosclerosis and tortuosity of the aorta. No enlarged abdominal or pelvic lymph nodes. Reproductive: Status post hysterectomy. No adnexal masses. Other: Moderate amount of water density abdominopelvic ascites. Musculoskeletal: Dextroconvex scoliosis of the lumbosacral spine with advanced osteoarthritic changes. Spinal stimulator in place. IMPRESSION: 1. Pneumobilia, usually associated with prior sphincterotomy, stable. Please correlate clinically. 2. Mild dilation of the proximal jejunum with evidence of stasis and mild mucosal thickening, suggestive of infectious/inflammatory changes. 3. Mild nonspecific mucosal thickening of the redundant descending colon, likely due to colitis. 4. Moderate amount of water density abdominopelvic ascites, new from 02/03/2018. 5. Small hiatal hernia. Electronically Signed   By: Fidela Salisbury M.D.   On: 04/06/2018 17:35   Dg Abd Acute W/chest  Result Date: 04/06/2018 CLINICAL DATA:  Nausea vomiting. EXAM: DG ABDOMEN ACUTE W/ 1V CHEST COMPARISON:  03/22/2018  FINDINGS: Borderline gaseous distension of small bowel loops in the central abdomen with associated mucosal thickening. Moderate large amount of formed stool throughout the colon with preserved colonic bowel gas pattern. Question sliver of free gas outlines the liver shadow. Spinal stimulators noted.  Post cholecystectomy. Normal cardiac silhouette. Calcific atherosclerotic disease of the aorta. No evidence of airspace consolidation. IMPRESSION: 1. Borderline gaseous distension of small bowel loops in the central abdomen with associated mucosal thickening, small bowel ileus versus early/incomplete small bowel obstruction. 2. Constipation. 3. Question sliver of free intra-abdominal gas. Confirmation with decubital radiograph, left-side-down may be considered. Electronically Signed   By: Fidela Salisbury M.D.   On: 04/06/2018 16:35    Review of Systems  Constitutional: Positive for  fever (she is not sure). Negative for chills, diaphoresis, malaise/fatigue and weight loss.  HENT: Negative.   Eyes: Negative.   Respiratory: Negative.   Cardiovascular: Negative.   Gastrointestinal: Positive for abdominal pain, constipation (she has a pain pump that is constant delivery), nausea and vomiting. Negative for blood in stool, diarrhea and melena.       Last [facility] Monday  3/9  Genitourinary: Negative.   Musculoskeletal: Positive for back pain and myalgias.  Skin: Negative.        She has an area right cheek where she had a skin cancer removed.  Neurological: Negative.   Endo/Heme/Allergies: Bruises/bleeds easily.  Psychiatric/Behavioral: Negative.    Blood pressure (!) 92/53, pulse 80, temperature 99.4 F (37.4 C), resp. rate (!) 9, height 5' (1.524 m), weight 45.4 kg, SpO2 94 %. Physical Exam  Constitutional: She is oriented to person, place, and time. No distress.  Elderly frail 85 year old female, lying in the bed.  Her abdominal pain and nausea have resolved.  HENT:  Head: Normocephalic and atraumatic.  Mouth/Throat: Oropharynx is clear and moist.  Eyes: Right eye exhibits no discharge. Left eye exhibits no discharge. No scleral icterus.  Pupils are equal  Neck: Normal range of motion. Neck supple. No JVD present. No tracheal deviation present. No thyromegaly present.  Cardiovascular: Normal rate, regular rhythm, normal heart sounds and intact distal pulses.  No murmur heard. Distal pulses are diminished  Respiratory: Effort normal and breath sounds normal. No respiratory distress. She has no wheezes. She has no rales. She exhibits no tenderness.  GI: Soft. She exhibits distension (Minimal distention) and mass (She has an implanted pain pump that is a combination of narcotic and lidocaine like medication.). There is abdominal tenderness (She is sore but there is no point tenderness.). There is no rebound and no guarding.  Musculoskeletal:        General: No tenderness or  edema.  Lymphadenopathy:    She has no cervical adenopathy.  Neurological: She is alert and oriented to person, place, and time. No cranial nerve deficit.  Skin: Skin is warm and dry. No rash noted. She is not diaphoretic. No erythema. No pallor.  She has a 2 cm area on her left cheek covered with ointment side of the skin biopsy-removals of a skin cancer  Psychiatric: She has a normal mood and affect. Her behavior is normal. Judgment and thought content normal.  Daughter is with her but the patient independent lives at home by herself.    Assessment/Plan: Abdominal pain nausea and vomiting Hx chronic constipation -on lactulose Hx recurrent enteritis - last hospitalization 1/08-1/11/20 for gastroenteritis/aspiration pneumonia Jejunal mucosal thickening/possible infectious/inflammatory SBO vs ileus Hx COPD/tobacco use/Mill[place] in the past Hx diastolic heart failure Chronic back pain/scoliosis -with implanted  pain pump - [facility] Severe protein calorie malnutrition Debility/deconditioning   Plan: Patient's nausea and vomiting has resolved.  She is not very distended currently and is feeling better.  I recommend placing her on some sips and chips for comfort right now.  We will repeat a film in the a.m. and see how she is done after some bowel rest and rehydration.  Agree with IV Flagyl, we will follow with you.   Raelynne Ludwick 04/07/2018, 10:14 AM

## 2018-04-07 NOTE — ED Notes (Signed)
Spoke with provider at this time about pt current status. See orders for interventions. Family updated at this time.

## 2018-04-07 NOTE — Progress Notes (Signed)
PROGRESS NOTE    [name] [name]  OEU:[id number] DOB: 07/07/1925 DOA: 04/06/2018 PCP: Debbrah Alar, NP  Brief Narrative49 y.o. female with medical history significant of chronic abdominal pain, chronic diastolic CHF, choledocholithiasis, chronic anemia, chronic back pain, COPD on home oxygen, history of daily headaches, history of healthcare associated pneumonia, GERD, hiatal hernia, hyperlipidemia, hypertension, osteoarthritis, osteoporosis, history of pancolitis, history of pancreatitis, PUD, recurrent UTI, scoliosis who is being brought to the emergency department due to several episodes of nausea and emesis since this morning.  She is currently somnolent and unable to provide much information.  The history is taken from her daughters and granddaughter.  They stated that she was in her usual state of health yesterday.  She is normally very active and fully oriented.  Family also mentioned that she has had 7 previous sepsis episodes due to UTI.    ED Course: Initial temperature 100.1 F I, pulse 130, respirations 19, blood pressure 190/104 mmHg and O2 sat 93% on room air.  The patient received ibuprofen 400 mg p.o. x1, at least 3000 mL of NS bolus, Zofran 4 mg IVP x2, metronidazole, cefepime and vancomycin.  Urinalysis shows small hemoglobinuria, ketonuria of 5 and proteinuria of 30 mg/dL.  Other values were normal.  CBC shows a white count of 13.1 with 90% neutrophils, 6% lymphocytes and 4% monocytes.  Hemoglobin was 16.0 g/dL and platelets 265.  Lactic acid was normal.  Lipase was 29.  Her CMP had a glucose of 168 mg/dL, but all other values were normal.  Phosphorus was 3.0 and magnesium 1.6 mg/dL.  Blood cultures were drawn. Imaging: her abdomen/chest radiograph show borderline gaseous distention of small bowel loops in the central abdomen with associated mucosal thickening, small bowel ileus versus early incomplete small bowel obstruction.  CT abdomen and pelvis with contrast showed  pneumobilia, usually associated with prior sphincterectomy, mild dilatation of the proximal jejunum with evidence of statuses and mild mucosal thickening suggestive of infectious/inflammatory changes.  There is mild nonspecific mucosal thickening of the redundant descending colon, likely due to colitis.  There is moderate amount of water density abdominal pelvic ascites.  Please see images and full radiology report for further detail   Assessment & Plan:   Principal Problem:   Acute colitis Active Problems:   HYPERCHOLESTEROLEMIA   Chronic diastolic CHF (congestive heart failure) (HCC)   GERD   COPD (chronic obstructive pulmonary disease) (HCC)   Polycythemia   Hypomagnesemia   Hyperglycemia   Abdominal distension   Sepsis due to undetermined organism ([place])   #1 sepsis presented with hypotension tachycardia and fever.?  Secondary to colitis.  UA not very impressive.  CT scan shows small bowel wall thickening consistent with infection or inflammation.  Continue Cipro and Flagyl.  #2 ileus versus early obstruction patient appears comfortable with no further vomiting.  CT also showed pneumobilia with ileus versus obstruction.  Surgery consulted.  #3  Chronic diastolic CHF stable  #4 history of GERD continue IV Protonix  #5 multiple electrolyte abnormalities including hypocalcemia hypomagnesemia hypokalemia replace and recheck  DVT prophylaxis: SCDs. Code Status: Full code. Family Communication: Her daughters and granddaughter were present in the ED room. Disposition Plan: Admit for IV hydration and antibiotic therapy. Consults called:  Admission status: Observation/stepdown.      Estimated body mass index is 19.53 kg/m as calculated from the following:   Height as of this encounter: 5' (1.524 m).   Weight as of this encounter: 45.4 kg.    Subjective: Patient resting  in bed family by the bedside she appears comfortable no further nausea vomiting  reported  Objective: Vitals:   04/07/18 0530 04/07/18 0600 04/07/18 0630 04/07/18 0700  BP: (!) 87/51 (!) 98/49 (!) 96/50 (!) 92/53  Pulse: 81 79 81 80  Resp: 12 (!) 9 10 (!) 9  Temp:      TempSrc:      SpO2: 94% 96% 94% 94%  Weight:      Height:        Intake/Output Summary (Last 24 hours) at 04/07/2018 0731 Last data filed at 04/07/2018 0100 Gross per 24 hour  Intake 3544.79 ml  Output 350 ml  Net 3194.79 ml   Filed Weights   04/06/18 1340  Weight: 45.4 kg    Examination:  General exam: Appears calm and comfortable  Respiratory system: Clear to auscultation. Respiratory effort normal. Cardiovascular system: S1 & S2 heard, RRR. No JVD, murmurs, rubs, gallops or clicks. No pedal edema. Gastrointestinal system: Abdomen is nondistended, soft and nontender. No organomegaly or masses felt. Normal bowel sounds heard. Central nervous system: Alert and oriented. No focal neurological deficits. Extremities: Symmetric 5 x 5 power. Skin: No rashes, lesions or ulcers Psychiatry: Judgement and insight appear normal. Mood & affect appropriate.     Data Reviewed: I have personally reviewed following labs and imaging studies  CBC: Recent Labs  Lab 04/06/18 1419  WBC 13.1*  NEUTROABS 11.7*  HGB 16.0*  HCT 51.0*  MCV 96.4  PLT 253   Basic Metabolic Panel: Recent Labs  Lab 04/06/18 1419 04/06/18 1435  NA 140  --   K 3.9  --   CL 99  --   CO2 29  --   GLUCOSE 168*  --   BUN 20  --   CREATININE 0.84  --   CALCIUM 9.2  --   MG  --  1.6*  PHOS  --  3.0   GFR: Estimated Creatinine Clearance: 31.9 mL/min (by C-G formula based on SCr of 0.84 mg/dL). Liver Function Tests: Recent Labs  Lab 04/06/18 1419  AST 26  ALT 14  ALKPHOS 80  BILITOT 0.4  PROT 6.9  ALBUMIN 3.8   Recent Labs  Lab 04/06/18 1419  LIPASE 29   No results for input(s): AMMONIA in the last 168 hours. Coagulation Profile: No results for input(s): INR, PROTIME in the last 168 hours. Cardiac  Enzymes: No results for input(s): CKTOTAL, CKMB, CKMBINDEX, TROPONINI in the last 168 hours. BNP (last 3 results) No results for input(s): PROBNP in the last 8760 hours. HbA1C: No results for input(s): HGBA1C in the last 72 hours. CBG: No results for input(s): GLUCAP in the last 168 hours. Lipid Profile: No results for input(s): CHOL, HDL, LDLCALC, TRIG, CHOLHDL, LDLDIRECT in the last 72 hours. Thyroid Function Tests: No results for input(s): TSH, T4TOTAL, FREET4, T3FREE, THYROIDAB in the last 72 hours. Anemia Panel: No results for input(s): VITAMINB12, FOLATE, FERRITIN, TIBC, IRON, RETICCTPCT in the last 72 hours. Sepsis Labs: Recent Labs  Lab 04/06/18 1419  LATICACIDVEN 1.9    Recent Results (from the past 240 hour(s))  Blood Culture (routine x 2)     Status: None (Preliminary result)   Collection Time: 04/06/18  2:19 PM  Result Value Ref Range Status   Specimen Description   Final    BLOOD LEFT HAND Performed at [facility], [place] [address] [place], [place] [address]    Special Requests   Final    BOTTLES DRAWN AEROBIC ONLY  Blood Culture adequate volume Performed at [facility] [address] [place], [place] [address]    Culture   Final    NO GROWTH < 12 HOURS Performed at [facility] [address], [place], [place] [address]    Report Status PENDING  Incomplete  Blood Culture (routine x 2)     Status: None (Preliminary result)   Collection Time: 04/06/18  2:19 PM  Result Value Ref Range Status   Specimen Description   Final    BLOOD LEFT ANTECUBITAL Performed at [facility] [address] [place], [place] [address]    Special Requests   Final    BOTTLES DRAWN AEROBIC AND ANAEROBIC Blood Culture adequate volume Performed at [facility] [address], [place], [place] [address]    Culture   Final    NO GROWTH < 12 HOURS Performed at [facility] [address], [place], [place] [address]    Report Status PENDING  Incomplete         Radiology Studies: Ct Abdomen Pelvis W Contrast  Result Date: 04/06/2018 CLINICAL DATA:  Abdominal distension and bilious vomiting. EXAM: CT ABDOMEN AND PELVIS WITH CONTRAST TECHNIQUE: Multidetector CT imaging of the abdomen and pelvis was performed using the standard protocol following bolus administration of intravenous contrast. CONTRAST:  51mL OMNIPAQUE IOHEXOL 300 MG/ML  SOLN COMPARISON:  Abdominal radiograph 04/06/2018 FINDINGS: Lower chest: Calcific atherosclerotic disease of the aorta. Small hiatal hernia. Improving nodular opacities in the lingula. Hepatobiliary: Moderate amount of pneumobilia. No definite liver masses. Peripheral dilation of the intrahepatic biliary ducts. Post cholecystectomy. Pancreas: Unremarkable. No pancreatic ductal dilatation or surrounding inflammatory changes. Spleen: Normal in size without focal abnormality. Adrenals/Urinary Tract: Adrenal glands are unremarkable. Kidneys are normal, without renal calculi, focal lesion, or hydronephrosis. Bladder is unremarkable. Stomach/Bowel: Normal appearance of the stomach and duodenum. Mild dilation of the proximal jejunum measuring up to 3.4 cm with evidence of stasis and mild mucosal thickening. The distal small bowel for returns to normal caliber. Formed stool throughout the colon mild nonspecific mucosal thickening of the redundant descending colon. Vascular/Lymphatic: Aortic atherosclerosis and tortuosity of the aorta. No enlarged abdominal or pelvic lymph nodes. Reproductive: Status post hysterectomy. No adnexal masses. Other: Moderate amount of water density abdominopelvic ascites. Musculoskeletal: Dextroconvex scoliosis of the lumbosacral spine with advanced osteoarthritic changes. Spinal stimulator in place. IMPRESSION: 1. Pneumobilia, usually associated with prior sphincterotomy, stable. Please correlate clinically. 2. Mild dilation of the proximal  jejunum with evidence of stasis and mild mucosal thickening, suggestive of infectious/inflammatory changes. 3. Mild nonspecific mucosal thickening of the redundant descending colon, likely due to colitis. 4. Moderate amount of water density abdominopelvic ascites, new from 02/03/2018. 5. Small hiatal hernia. Electronically Signed   By: Fidela Salisbury M.D.   On: 04/06/2018 17:35   Dg Abd Acute W/chest  Result Date: 04/06/2018 CLINICAL DATA:  Nausea vomiting. EXAM: DG ABDOMEN ACUTE W/ 1V CHEST COMPARISON:  03/22/2018 FINDINGS: Borderline gaseous distension of small bowel loops in the central abdomen with associated mucosal thickening. Moderate large amount of formed stool throughout the colon with preserved colonic bowel gas pattern. Question sliver of free gas outlines the liver shadow. Spinal stimulators noted.  Post cholecystectomy. Normal cardiac silhouette. Calcific atherosclerotic disease of the aorta. No evidence of airspace consolidation. IMPRESSION: 1. Borderline gaseous distension of small bowel loops in the central abdomen with associated mucosal thickening, small bowel ileus versus early/incomplete small bowel obstruction. 2. Constipation.  3. Question sliver of free intra-abdominal gas. Confirmation with decubital radiograph, left-side-down may be considered. Electronically Signed   By: Fidela Salisbury M.D.   On: 04/06/2018 16:35        Scheduled Meds: . denosumab  60 mg Subcutaneous Once  . pantoprazole (PROTONIX) IV  40 mg Intravenous QHS   Continuous Infusions: . sodium chloride 75 mL/hr at 04/07/18 0142  . ciprofloxacin Stopped (04/06/18 2210)  . metronidazole 500 mg (04/06/18 2308)  . sodium chloride       LOS: 0 days      Georgette Shell, MD [facility]   If 7PM-7AM, please contact night-coverage [email or web address] Password [facility] 04/07/2018, 7:31 AM

## 2018-04-07 NOTE — ED Notes (Signed)
Family is concerned about BP dropping; however family also states that this is the 3th admisson with the same issue. Pt is NOT symptomatic with BP fluctiations and is currently asleep with BP of 94/44. Pt was able to abmulate to [facility] without symptoms.

## 2018-04-08 ENCOUNTER — Inpatient Hospital Stay (HOSPITAL_COMMUNITY): Payer: Medicare Other

## 2018-04-08 LAB — CBC
HCT: 36.5 % (ref 36.0–46.0)
Hemoglobin: 10.8 g/dL — ABNORMAL LOW (ref 12.0–15.0)
MCH: 30.4 pg (ref 26.0–34.0)
MCHC: 29.6 g/dL — ABNORMAL LOW (ref 30.0–36.0)
MCV: 102.8 fL — ABNORMAL HIGH (ref 80.0–100.0)
Platelets: 116 10*3/uL — ABNORMAL LOW (ref 150–400)
RBC: 3.55 MIL/uL — ABNORMAL LOW (ref 3.87–5.11)
RDW: 15 % (ref 11.5–15.5)
WBC: 3.6 10*3/uL — ABNORMAL LOW (ref 4.0–10.5)
nRBC: 0 % (ref 0.0–0.2)

## 2018-04-08 LAB — COMPREHENSIVE METABOLIC PANEL
ALT: 11 U/L (ref 0–44)
AST: 16 U/L (ref 15–41)
Albumin: 2.6 g/dL — ABNORMAL LOW (ref 3.5–5.0)
Alkaline Phosphatase: 42 U/L (ref 38–126)
Anion gap: 3 — ABNORMAL LOW (ref 5–15)
BUN: 9 mg/dL (ref 8–23)
CO2: 25 mmol/L (ref 22–32)
Calcium: 6.8 mg/dL — ABNORMAL LOW (ref 8.9–10.3)
Chloride: 112 mmol/L — ABNORMAL HIGH (ref 98–111)
Creatinine, Ser: 0.49 mg/dL (ref 0.44–1.00)
GFR calc Af Amer: >60 mL/min (ref >60)
GFR calc non Af Amer: >60 mL/min (ref >60)
Glucose, Bld: 83 mg/dL (ref 70–99)
Potassium: 3.9 mmol/L (ref 3.5–5.1)
Sodium: 140 mmol/L (ref 135–145)
Total Bilirubin: 0.5 mg/dL (ref 0.3–1.2)
Total Protein: 4.8 g/dL — ABNORMAL LOW (ref 6.5–8.1)

## 2018-04-08 LAB — URINE CULTURE: Culture: NO GROWTH

## 2018-04-08 LAB — MAGNESIUM: Magnesium: 2.1 mg/dL (ref 1.7–2.4)

## 2018-04-08 MED ORDER — OXYCODONE-ACETAMINOPHEN 7.5-325 MG PO TABS
1.0000 | ORAL_TABLET | Freq: Two times a day (BID) | ORAL | Status: DC | PRN
  Administered 2018-04-08 – 2018-04-09 (×2): 1 via ORAL
  Filled 2018-04-08 (×2): qty 1

## 2018-04-08 MED ORDER — ENSURE ENLIVE PO LIQD
237.0000 mL | Freq: Three times a day (TID) | ORAL | Status: DC
  Administered 2018-04-08 – 2018-04-09 (×2): 237 mL via ORAL

## 2018-04-08 NOTE — Evaluation (Signed)
Physical Therapy Evaluation Patient Details Name: [name] [name] MRN: [id number] DOB: [birth year] Today's Date: 04/08/2018   History of Present Illness  85 year old with history of essential hypertension, headaches, scoliosis, former smoker with COPD on 2 L nasal cannula at night, GERD, CHF, pancreatitis, CKD stage II admitted to ED on 3/10 with complaints of nausea, vomiting and shortness of breath. Pt with pneumobilia with ileus vs obstruction. Pt recently hospitalized in January 2020 with aspiration pneumonia, possible colitis.  Clinical Impression   Pt presents with LE generalized weakness, unsteadiness in standing, and decreased activity tolerance. Pt to benefit from acute PT to address deficits. Pt ambulated in room with RW for steadying, as pt with unsteadiness upon standing without AD. PT recommending HHPT to address pt deficits. Pt maintained sats >=92% during ambulation on RA. PT to progress mobility as tolerated, and will continue to follow acutely.      Follow Up Recommendations Home health PT;Supervision - Intermittent    Equipment Recommendations  None recommended by PT    Recommendations for Other Services       Precautions / Restrictions Precautions Precautions: Fall Restrictions Weight Bearing Restrictions: No      Mobility  Bed Mobility Overal bed mobility: Needs Assistance Bed Mobility: Supine to Sit     Supine to sit: HOB elevated;Supervision     General bed mobility comments: Supervision for safety. increased time and effort, use of bedrail.   Transfers Overall transfer level: Needs assistance Equipment used: None Transfers: Sit to/from Stand Sit to Stand: Min guard         General transfer comment: Min guard for safety. Upon standing, pt unsteady and reaching for environment.   Ambulation/Gait Ambulation/Gait assistance: Min guard Gait Distance (Feet): 20 Feet Assistive device: Rolling walker (2 wheeled) Gait Pattern/deviations: Step-through  pattern;Decreased stride length Gait velocity: decr    General Gait Details: Min guard for safety. use of RW because pt was noticably unsteady and states "I feel wobbly". Pt also reported feeling woozy, but clarified and just said she felt weak.   Stairs            Wheelchair Mobility    Modified Rankin (Stroke Patients Only)       Balance Overall balance assessment: Needs assistance Sitting-balance support: No upper extremity supported;Feet supported Sitting balance-Leahy Scale: Good Sitting balance - Comments: able to sit EOB without difficulty, can move LEs without change to balance    Standing balance support: Bilateral upper extremity supported Standing balance-Leahy Scale: Poor Standing balance comment: reliant on RW for steadying                              Pertinent Vitals/Pain Pain Assessment: 0-10 Pain Score: 8  Pain Location: head  Pain Descriptors / Indicators: Headache Pain Intervention(s): Limited activity within patient's tolerance;Premedicated before session;Monitored during session    Home Living Family/patient expects to be discharged to:: Private residence Living Arrangements: Alone Available Help at Discharge: Family;Available PRN/intermittently Type of Home: House Home Access: Ramped entrance     Home Layout: One level Home Equipment: Clinical cytogeneticist - 2 wheels;Cane - single point;Bedside commode Additional Comments: Pt lives alone, but sons live closeby and can assist as needed.     Prior Function Level of Independence: Independent with assistive device(s)         Comments: Pt uses cane when ambulating in community, pt reports no falls. Pt reports being very active, and always on the go either  in the community or in her yard.     Hand Dominance   Dominant Hand: Left    Extremity/Trunk Assessment   Upper Extremity Assessment Upper Extremity Assessment: Overall WFL for tasks assessed    Lower Extremity  Assessment Lower Extremity Assessment: Generalized weakness    Cervical / Trunk Assessment Cervical / Trunk Assessment: Normal  Communication   Communication: No difficulties  Cognition Arousal/Alertness: Awake/alert Behavior During Therapy: WFL for tasks assessed/performed Overall Cognitive Status: Within Functional Limits for tasks assessed                                        General Comments General comments (skin integrity, edema, etc.): Pt SpO2 on RA during ambulation 92%, HR WNL.     Exercises     Assessment/Plan    PT Assessment Patient needs continued PT services  PT Problem List Decreased strength;Decreased mobility;Decreased safety awareness;Decreased activity tolerance;Decreased balance;Decreased knowledge of use of DME       PT Treatment Interventions      PT Goals (Current goals can be found in the Care Plan section)  Acute Rehab PT Goals Patient Stated Goal: Go home  PT Goal Formulation: With patient Time For Goal Achievement: 04/22/18 Potential to Achieve Goals: Good    Frequency     Barriers to discharge        Co-evaluation               AM-PAC PT "6 Clicks" Mobility  Outcome Measure Help needed turning from your back to your side while in a flat bed without using bedrails?: A Little Help needed moving from lying on your back to sitting on the side of a flat bed without using bedrails?: A Little Help needed moving to and from a bed to a chair (including a wheelchair)?: A Little Help needed standing up from a chair using your arms (e.g., wheelchair or bedside chair)?: A Little Help needed to walk in hospital room?: A Little Help needed climbing 3-5 steps with a railing? : A Little 6 Click Score: 18    End of Session Equipment Utilized During Treatment: Gait belt Activity Tolerance: Patient tolerated treatment well Patient left: in chair;with chair alarm set;with call bell/phone within reach Nurse Communication:  Mobility status;Other (comment)(sats maintained >=92% on RA during ambulation ) PT Visit Diagnosis: Other abnormalities of gait and mobility (R26.89);Unsteadiness on feet (R26.81)    Time: 1431-1455 PT Time Calculation (min) (ACUTE ONLY): 24 min   Charges:   PT Evaluation $PT Eval Low Complexity: 1 Low PT Treatments $Gait Training: 8-22 mins        Julien Girt, PT Acute Rehabilitation Services Pager [phone]  Office [place] 04/08/2018, 4:30 PM

## 2018-04-08 NOTE — Progress Notes (Signed)
Patient ID: [name] [name], female   DOB: [birth year], 85 y.o.   MRN: [id number]       Subjective: Patient feels much better today.  No nausea.  Denies abdominal pain currently.  Objective: Vital signs in last 24 hours: Temp:  [98.4 F (36.9 C)-99.1 F (37.3 C)] 98.4 F (36.9 C) (03/12 0704) Pulse Rate:  [77-89] 80 (03/12 0704) Resp:  [11-25] 17 (03/12 0704) BP: (107-132)/(53-99) 119/54 (03/12 0704) SpO2:  [97 %-100 %] 97 % (03/12 0704) Weight:  [49.9 kg] 49.9 kg (03/11 1833) Last BM Date: 04/05/18  Intake/Output from previous day: 03/11 0701 - 03/12 0700 In: 3812.6 [I.V.:3137.7; IV Piggyback:674.9] Out: -  Intake/Output this shift: No intake/output data recorded.  PE: Abd: soft, NT, ND, +BS  Lab Results:  Recent Labs    04/07/18 1005 04/08/18 0421  WBC 3.8* 3.6*  HGB 10.5* 10.8*  HCT 34.9* 36.5  PLT 122* 116*   BMET Recent Labs    04/07/18 1049 04/08/18 0421  NA 141 140  K 3.6 3.9  CL 112* 112*  CO2 26 25  GLUCOSE 94 83  BUN 15 9  CREATININE 0.68 0.49  CALCIUM 6.5* 6.8*   PT/INR No results for input(s): LABPROT, INR in the last 72 hours. CMP     Component Value Date/Time   NA 140 04/08/2018 0421   K 3.9 04/08/2018 0421   CL 112 (H) 04/08/2018 0421   CO2 25 04/08/2018 0421   GLUCOSE 83 04/08/2018 0421   BUN 9 04/08/2018 0421   CREATININE 0.49 04/08/2018 0421   CREATININE 0.94 (H) 05/22/2017 1427   CALCIUM 6.8 (L) 04/08/2018 0421   PROT 4.8 (L) 04/08/2018 0421   ALBUMIN 2.6 (L) 04/08/2018 0421   AST 16 04/08/2018 0421   ALT 11 04/08/2018 0421   ALKPHOS 42 04/08/2018 0421   BILITOT 0.5 04/08/2018 0421   GFRNONAA >60 04/08/2018 0421   GFRNONAA 57 (L) 04/27/2017 1446   GFRAA >60 04/08/2018 0421   GFRAA 67 04/27/2017 1446   Lipase     Component Value Date/Time   LIPASE 29 04/06/2018 1419       Studies/Results: Dg Abd 1 View  Result Date: 04/08/2018 CLINICAL DATA:  Small bowel obstruction. Enteritis. EXAM: ABDOMEN - 1 VIEW COMPARISON:   CT abdomen and pelvis 04/06/2018 FINDINGS: No dilated loops of bowel are seen to suggest obstruction. Scattered gas and a small amount of stool are present in the colon. Right upper quadrant abdominal surgical clips, a spinal stimulator, and a spinal infusion pump are noted. There is severe lumbar dextroscoliosis. IMPRESSION: No evidence of bowel obstruction. Electronically Signed   By: Logan Bores M.D.   On: 04/08/2018 08:48   Ct Abdomen Pelvis W Contrast  Result Date: 04/06/2018 CLINICAL DATA:  Abdominal distension and bilious vomiting. EXAM: CT ABDOMEN AND PELVIS WITH CONTRAST TECHNIQUE: Multidetector CT imaging of the abdomen and pelvis was performed using the standard protocol following bolus administration of intravenous contrast. CONTRAST:  92mL OMNIPAQUE IOHEXOL 300 MG/ML  SOLN COMPARISON:  Abdominal radiograph 04/06/2018 FINDINGS: Lower chest: Calcific atherosclerotic disease of the aorta. Small hiatal hernia. Improving nodular opacities in the lingula. Hepatobiliary: Moderate amount of pneumobilia. No definite liver masses. Peripheral dilation of the intrahepatic biliary ducts. Post cholecystectomy. Pancreas: Unremarkable. No pancreatic ductal dilatation or surrounding inflammatory changes. Spleen: Normal in size without focal abnormality. Adrenals/Urinary Tract: Adrenal glands are unremarkable. Kidneys are normal, without renal calculi, focal lesion, or hydronephrosis. Bladder is unremarkable. Stomach/Bowel: Normal appearance of the stomach and  duodenum. Mild dilation of the proximal jejunum measuring up to 3.4 cm with evidence of stasis and mild mucosal thickening. The distal small bowel for returns to normal caliber. Formed stool throughout the colon mild nonspecific mucosal thickening of the redundant descending colon. Vascular/Lymphatic: Aortic atherosclerosis and tortuosity of the aorta. No enlarged abdominal or pelvic lymph nodes. Reproductive: Status post hysterectomy. No adnexal masses.  Other: Moderate amount of water density abdominopelvic ascites. Musculoskeletal: Dextroconvex scoliosis of the lumbosacral spine with advanced osteoarthritic changes. Spinal stimulator in place. IMPRESSION: 1. Pneumobilia, usually associated with prior sphincterotomy, stable. Please correlate clinically. 2. Mild dilation of the proximal jejunum with evidence of stasis and mild mucosal thickening, suggestive of infectious/inflammatory changes. 3. Mild nonspecific mucosal thickening of the redundant descending colon, likely due to colitis. 4. Moderate amount of water density abdominopelvic ascites, new from 02/03/2018. 5. Small hiatal hernia. Electronically Signed   By: Fidela Salisbury M.D.   On: 04/06/2018 17:35   Dg Abd Acute W/chest  Result Date: 04/06/2018 CLINICAL DATA:  Nausea vomiting. EXAM: DG ABDOMEN ACUTE W/ 1V CHEST COMPARISON:  03/22/2018 FINDINGS: Borderline gaseous distension of small bowel loops in the central abdomen with associated mucosal thickening. Moderate large amount of formed stool throughout the colon with preserved colonic bowel gas pattern. Question sliver of free gas outlines the liver shadow. Spinal stimulators noted.  Post cholecystectomy. Normal cardiac silhouette. Calcific atherosclerotic disease of the aorta. No evidence of airspace consolidation. IMPRESSION: 1. Borderline gaseous distension of small bowel loops in the central abdomen with associated mucosal thickening, small bowel ileus versus early/incomplete small bowel obstruction. 2. Constipation. 3. Question sliver of free intra-abdominal gas. Confirmation with decubital radiograph, left-side-down may be considered. Electronically Signed   By: Fidela Salisbury M.D.   On: 04/06/2018 16:35    Anti-infectives: Anti-infectives (From admission, onward)   Start     Dose/Rate Route Frequency Ordered Stop   04/06/18 2359  metroNIDAZOLE (FLAGYL) IVPB 500 mg     500 mg 100 mL/hr over 60 Minutes Intravenous Every 8 hours  04/06/18 2005     04/06/18 2030  ciprofloxacin (CIPRO) IVPB 400 mg     400 mg 200 mL/hr over 60 Minutes Intravenous Every 12 hours 04/06/18 2006     04/06/18 1515  aztreonam (AZACTAM) 2 g in sodium chloride 0.9 % 100 mL IVPB  Status:  Discontinued     2 g 200 mL/hr over 30 Minutes Intravenous  Once 04/06/18 1501 04/06/18 1507   04/06/18 1515  metroNIDAZOLE (FLAGYL) IVPB 500 mg     500 mg 100 mL/hr over 60 Minutes Intravenous  Once 04/06/18 1501 04/06/18 1740   04/06/18 1515  vancomycin (VANCOCIN) IVPB 1000 mg/200 mL premix     1,000 mg 200 mL/hr over 60 Minutes Intravenous  Once 04/06/18 1501 04/06/18 1838   04/06/18 1515  ceFEPIme (MAXIPIME) 2 g in sodium chloride 0.9 % 100 mL IVPB     2 g 200 mL/hr over 30 Minutes Intravenous  Once 04/06/18 1508 04/06/18 1624       Assessment/Plan Recurrent enteritis  -films today are negative for any degree of obstruction.  Patient is feeling better with no further N/V/abdominal pain -adv to clear liquids.  Diet may be advanced per primary service as able. -patient appears to have enteritis with no evidence of bowel obstruction.  No acute surgical indications at this time.  Agree with abx therapy. -if this problems persists she may benefit from GI evaluation, but do not see a clear indication for  that at this time. -we will sign off.   FEN - CLD VTE - none ID - cipro/flagyl   LOS: 1 day    Henreitta Cea , [facility] Surgery 04/08/2018, 10:44 AM Pager: [phone]

## 2018-04-08 NOTE — Progress Notes (Signed)
Initial Nutrition Assessment  DOCUMENTATION CODES:   Not applicable  INTERVENTION:   Ensure Enlive po TID, each supplement provides 350 kcal and 20 grams of protein  NUTRITION DIAGNOSIS:   Inadequate oral intake related to vomiting, nausea as evidenced by meal completion < 50%.  GOAL:   Patient will meet greater than or equal to 90% of their needs  MONITOR:   PO intake, Supplement acceptance, Weight trends, Labs, Diet advancement  REASON FOR ASSESSMENT:   Malnutrition Screening Tool    ASSESSMENT:   Patient with PMH significant for chronic abdominal pain, CHF, choledocholithiasis, COPD, GERD, HLD, HTN, pancolitis, pancreatitis, PUD, and recurrent UTIs. Presents this admission with sepsis 2/2 to enteritis.    Pt showed to have ileus vs early obstruction. Vomiting has resolved. Diet advanced to full liquid.   Pt endorses having a loss in appetite for 4 days PTA due to ongoing vomiting. During this time period she was unable to tolerate anything PO. Pt typically consumes two meals daily that consist of stewed beef, potatoes, chicken, green beans, and potatoes. She snacks in between meals on chips, cookies, and Coke. Pt attempted to try Ensure at home after her hospitalization in Jan, but intake was inconsistent. Discussed the importance of protein intake for preservation of lean body mass. Pt willing to try Ensure this admission.   Pt reports a UBW of 120 lb the last time being at that weight 6 months ago. Records indicate pt has gained weight over the last year from 97 lb to 110 lb. Pt with fat and muscle depletion present on physical exam. With given recall, history of being smaller framed, and weight gain unable to definitively diagnosis malnutrition at this time. Suspect depletions could be a result of advanced age.   Medications reviewed and include: NS @ 75 ml/hr Labs reviewed: corrected calcium 7.9 (L)  NUTRITION - FOCUSED PHYSICAL EXAM:    Most Recent Value  Orbital  Region  Mild depletion  Upper Arm Region  Mild depletion  Thoracic and Lumbar Region  Unable to assess  Buccal Region  Mild depletion  Temple Region  Moderate depletion  Clavicle Bone Region  Severe depletion  Clavicle and Acromion Bone Region  Severe depletion  Scapular Bone Region  Unable to assess  Dorsal Hand  Moderate depletion  Patellar Region  Moderate depletion  Anterior Thigh Region  Moderate depletion  Posterior Calf Region  Moderate depletion  Edema (RD Assessment)  None  Hair  Reviewed  Eyes  Reviewed  Mouth  Reviewed  Skin  Reviewed  Nails  Reviewed     Diet Order:   Diet Order            Diet full liquid Room service appropriate? Yes; Fluid consistency: Thin  Diet effective now              EDUCATION NEEDS:   Education needs have been addressed  Skin:  Skin Assessment: Skin Integrity Issues: Skin Integrity Issues:: Other (Comment) Other: abrasion- face  Last BM:  3/9  Height:   Ht Readings from Last 1 Encounters:  04/07/18 5\' 2"  (1.575 m)    Weight:   Wt Readings from Last 1 Encounters:  04/07/18 49.9 kg    Ideal Body Weight:  50 kg  BMI:  Body mass index is 20.12 kg/m.  Estimated Nutritional Needs:   Kcal:  1400-1600 kcal  Protein:  70-85 grams  Fluid:  >/= 1.4 L/day    Mariana Single RD, LDN Clinical Nutrition Pager # -  [phone]  

## 2018-04-08 NOTE — Progress Notes (Signed)
PROGRESS NOTE    [name] [name]  ZOX:[id number] DOB: Jul 05, 1925 DOA: 04/06/2018 PCP: Debbrah Alar, NP  Brief Narrative89 y.o.femalewith medical history significant ofchronic abdominal pain, chronic diastolic CHF, choledocholithiasis, chronic anemia, chronic back pain, COPD on home oxygen, history of daily headaches, history of healthcare associated pneumonia, GERD, hiatal hernia, hyperlipidemia, hypertension, osteoarthritis, osteoporosis, history of pancolitis, history of pancreatitis, PUD, recurrent UTI, scoliosis who is being brought to the emergency department due to several episodes of nausea and emesis since this morning.She is currently somnolent and unable to provide much information. The history is taken from her daughters and granddaughter. They stated that she was in her usual state of health yesterday. She is normally very active and fully oriented. Family also mentioned that she has had 7 previous sepsis episodes due to UTI.   ED Course:Initial temperature 100.1 F I, pulse 130, respirations 19, blood pressure 190/104 mmHg and O2 sat 93% on room air. The patient received ibuprofen 400 mg p.o. x1, at least 3000 mL of NS bolus, Zofran 4 mg IVP x2, metronidazole, cefepime and vancomycin.  Urinalysis shows small hemoglobinuria, ketonuria of 5 and proteinuria of 30 mg/dL. Other values were normal. CBC shows a white count of 13.1 with 90% neutrophils, 6% lymphocytes and 4% monocytes. Hemoglobin was 16.0 g/dL and platelets 265. Lactic acid was normal. Lipase was 29. Her CMP had a glucose of 168 mg/dL, but all other values were normal. Phosphorus was 3.0 and magnesium 1.6 mg/dL. Blood cultures were drawn.  Imaging:her abdomen/chest radiograph show borderline gaseous distention of small bowel loops in the central abdomen with associated mucosal thickening, small bowel ileus versus early incomplete small bowel obstruction. CT abdomen and pelvis with contrast showed  pneumobilia, usually associated with prior sphincterectomy, mild dilatation of the proximal jejunum with evidence of statuses and mild mucosal thickening suggestive of infectious/inflammatory changes. There is mild nonspecific mucosal thickening of the redundant descending colon, likely due to colitis. There is moderate amount of water density abdominal pelvic ascites. Please see images and full radiology report for further detail   Assessment & Plan:   Principal Problem:   Acute colitis Active Problems:   HYPERCHOLESTEROLEMIA   Chronic diastolic CHF (congestive heart failure) (HCC)   GERD   COPD (chronic obstructive pulmonary disease) (HCC)   Sepsis ([place])   Polycythemia   Hypomagnesemia   Hyperglycemia   Abdominal distension   Sepsis due to undetermined organism ([place])  #1 sepsis presented with hypotension tachycardia and fever.?  Secondary to enteritis UA not very impressive.  CT scan shows small bowel wall thickening consistent with infection or inflammation.  Continue Cipro and Flagyl.  #2 ileus versus early obstruction patient appears comfortable with no further vomiting.  CT also showed pneumobilia with ileus versus obstruction.  Continue antibiotics and advance diet as tolerated.  #3  Chronic diastolic CHF stable  #4 history of GERD continue IV Protonix  #5 multiple electrolyte abnormalities including hypocalcemia hypomagnesemia hypokalemia replaced   DVT prophylaxis:SCDs. Code Status:Full code. Family Communication: No family available in the room Disposition Plan:Admit for IV hydration and antibiotic therapy. Consults called: General surgery.      Estimated body mass index is 20.12 kg/m as calculated from the following:   Height as of this encounter: 5\' 2"  (1.575 m).   Weight as of this encounter: 49.9 kg.    Subjective: Resting complains of a headache reports she takes narcotics at home for headache for many years denies abdominal pain having  flatus did not have a bowel movement  Objective: Vitals:   04/07/18 1829 04/07/18 1833 04/07/18 2248 04/08/18 0704  BP: (!) 109/99  127/66 (!) 119/54  Pulse: 89  78 80  Resp: 16  16 17   Temp: 99.1 F (37.3 C)  98.8 F (37.1 C) 98.4 F (36.9 C)  TempSrc: Oral  Oral Oral  SpO2: 100%  100% 97%  Weight:  49.9 kg    Height:  5\' 2"  (1.575 m)      Intake/Output Summary (Last 24 hours) at 04/08/2018 1120 Last data filed at 04/08/2018 0626 Gross per 24 hour  Intake 1836.72 ml  Output --  Net 1836.72 ml   Filed Weights   04/06/18 1340 04/07/18 1833  Weight: 45.4 kg 49.9 kg    Examination:  General exam: Appears calm and comfortable  Respiratory system: Clear to auscultation. Respiratory effort normal. Cardiovascular system: S1 & S2 heard, RRR. No JVD, murmurs, rubs, gallops or clicks. No pedal edema. Gastrointestinal system: Abdomen is nondistended, soft and nontender. No organomegaly or masses felt. Normal bowel sounds heard. Central nervous system: Alert and oriented. No focal neurological deficits. Extremities: Symmetric 5 x 5 power. Skin: No rashes, lesions or ulcers Psychiatry: Judgement and insight appear normal. Mood & affect appropriate.     Data Reviewed: I have personally reviewed following labs and imaging studies  CBC: Recent Labs  Lab 04/06/18 1419 04/07/18 1005 04/08/18 0421  WBC 13.1* 3.8* 3.6*  NEUTROABS 11.7*  --   --   HGB 16.0* 10.5* 10.8*  HCT 51.0* 34.9* 36.5  MCV 96.4 100.9* 102.8*  PLT 265 122* 017*   Basic Metabolic Panel: Recent Labs  Lab 04/06/18 1419 04/06/18 1435 04/07/18 1049 04/08/18 0421  NA 140  --  141 140  K 3.9  --  3.6 3.9  CL 99  --  112* 112*  CO2 29  --  26 25  GLUCOSE 168*  --  94 83  BUN 20  --  15 9  CREATININE 0.84  --  0.68 0.49  CALCIUM 9.2  --  6.5* 6.8*  MG  --  1.6*  --  2.1  PHOS  --  3.0  --   --    GFR: Estimated Creatinine Clearance: 36.8 mL/min (by C-G formula based on SCr of 0.49 mg/dL). Liver  Function Tests: Recent Labs  Lab 04/06/18 1419 04/07/18 1049 04/08/18 0421  AST 26 15 16   ALT 14 8 11   ALKPHOS 80 42 42  BILITOT 0.4 0.4 0.5  PROT 6.9 4.4* 4.8*  ALBUMIN 3.8 2.4* 2.6*   Recent Labs  Lab 04/06/18 1419  LIPASE 29   No results for input(s): AMMONIA in the last 168 hours. Coagulation Profile: No results for input(s): INR, PROTIME in the last 168 hours. Cardiac Enzymes: No results for input(s): CKTOTAL, CKMB, CKMBINDEX, TROPONINI in the last 168 hours. BNP (last 3 results) No results for input(s): PROBNP in the last 8760 hours. HbA1C: No results for input(s): HGBA1C in the last 72 hours. CBG: No results for input(s): GLUCAP in the last 168 hours. Lipid Profile: No results for input(s): CHOL, HDL, LDLCALC, TRIG, CHOLHDL, LDLDIRECT in the last 72 hours. Thyroid Function Tests: No results for input(s): TSH, T4TOTAL, FREET4, T3FREE, THYROIDAB in the last 72 hours. Anemia Panel: No results for input(s): VITAMINB12, FOLATE, FERRITIN, TIBC, IRON, RETICCTPCT in the last 72 hours. Sepsis Labs: Recent Labs  Lab 04/06/18 1419  LATICACIDVEN 1.9    Recent Results (from the past 240 hour(s))  Blood Culture (routine x 2)  Status: None (Preliminary result)   Collection Time: 04/06/18  2:19 PM  Result Value Ref Range Status   Specimen Description   Final    BLOOD LEFT HAND Performed at [facility] [address] [place], [place] [address]    Special Requests   Final    BOTTLES DRAWN AEROBIC ONLY Blood Culture adequate volume Performed at [facility] [address], [place], [place] [address]    Culture   Final    NO GROWTH 2 DAYS Performed at [facility] [address] [place], [place] [address]    Report Status PENDING  Incomplete  Blood Culture (routine x 2)     Status: None (Preliminary result)   Collection Time: 04/06/18  2:19 PM  Result Value Ref Range Status   Specimen Description   Final    BLOOD  LEFT ANTECUBITAL Performed at [facility] [address] [place], [place] [address]    Special Requests   Final    BOTTLES DRAWN AEROBIC AND ANAEROBIC Blood Culture adequate volume Performed at [facility] [address], [place], [place] [address]    Culture   Final    NO GROWTH 2 DAYS Performed at [facility] [address] [place], [place] [address]    Report Status PENDING  Incomplete  Urine Culture     Status: None   Collection Time: 04/06/18  5:48 PM  Result Value Ref Range Status   Specimen Description   Final    URINE, CATHETERIZED Performed at [facility] [address], [place], [place] [address]    Special Requests   Final    NONE Performed at [facility], [place] [address] [place], [place] [address]    Culture   Final    NO GROWTH Performed at [facility], [place] [address] [place],  [address]    Report Status 04/08/2018 FINAL  Final         Radiology Studies: Dg Abd 1 View  Result Date: 04/08/2018 CLINICAL DATA:  Small bowel obstruction. Enteritis. EXAM: ABDOMEN - 1 VIEW COMPARISON:  CT abdomen and pelvis 04/06/2018 FINDINGS: No dilated loops of bowel are seen to suggest obstruction. Scattered gas and a small amount of stool are present in the colon. Right upper quadrant abdominal surgical clips, a spinal stimulator, and a spinal infusion pump are noted. There is severe lumbar dextroscoliosis. IMPRESSION: No evidence of bowel obstruction. Electronically Signed   By: Logan Bores M.D.   On: 04/08/2018 08:48   Ct Abdomen Pelvis W Contrast  Result Date: 04/06/2018 CLINICAL DATA:  Abdominal distension and bilious vomiting. EXAM: CT ABDOMEN AND PELVIS WITH CONTRAST TECHNIQUE: Multidetector CT imaging of the abdomen and pelvis was performed using the standard protocol following bolus administration of intravenous contrast. CONTRAST:  41mL OMNIPAQUE IOHEXOL 300 MG/ML   SOLN COMPARISON:  Abdominal radiograph 04/06/2018 FINDINGS: Lower chest: Calcific atherosclerotic disease of the aorta. Small hiatal hernia. Improving nodular opacities in the lingula. Hepatobiliary: Moderate amount of pneumobilia. No definite liver masses. Peripheral dilation of the intrahepatic biliary ducts. Post cholecystectomy. Pancreas: Unremarkable. No pancreatic ductal dilatation or surrounding inflammatory changes. Spleen: Normal in size without focal abnormality. Adrenals/Urinary Tract: Adrenal glands are unremarkable. Kidneys are normal, without renal calculi, focal lesion, or hydronephrosis. Bladder is unremarkable. Stomach/Bowel: Normal appearance of the stomach and duodenum. Mild dilation of the proximal jejunum measuring up to 3.4 cm with evidence of stasis  and mild mucosal thickening. The distal small bowel for returns to normal caliber. Formed stool throughout the colon mild nonspecific mucosal thickening of the redundant descending colon. Vascular/Lymphatic: Aortic atherosclerosis and tortuosity of the aorta. No enlarged abdominal or pelvic lymph nodes. Reproductive: Status post hysterectomy. No adnexal masses. Other: Moderate amount of water density abdominopelvic ascites. Musculoskeletal: Dextroconvex scoliosis of the lumbosacral spine with advanced osteoarthritic changes. Spinal stimulator in place. IMPRESSION: 1. Pneumobilia, usually associated with prior sphincterotomy, stable. Please correlate clinically. 2. Mild dilation of the proximal jejunum with evidence of stasis and mild mucosal thickening, suggestive of infectious/inflammatory changes. 3. Mild nonspecific mucosal thickening of the redundant descending colon, likely due to colitis. 4. Moderate amount of water density abdominopelvic ascites, new from 02/03/2018. 5. Small hiatal hernia. Electronically Signed   By: Fidela Salisbury M.D.   On: 04/06/2018 17:35   Dg Abd Acute W/chest  Result Date: 04/06/2018 CLINICAL DATA:  Nausea  vomiting. EXAM: DG ABDOMEN ACUTE W/ 1V CHEST COMPARISON:  03/22/2018 FINDINGS: Borderline gaseous distension of small bowel loops in the central abdomen with associated mucosal thickening. Moderate large amount of formed stool throughout the colon with preserved colonic bowel gas pattern. Question sliver of free gas outlines the liver shadow. Spinal stimulators noted.  Post cholecystectomy. Normal cardiac silhouette. Calcific atherosclerotic disease of the aorta. No evidence of airspace consolidation. IMPRESSION: 1. Borderline gaseous distension of small bowel loops in the central abdomen with associated mucosal thickening, small bowel ileus versus early/incomplete small bowel obstruction. 2. Constipation. 3. Question sliver of free intra-abdominal gas. Confirmation with decubital radiograph, left-side-down may be considered. Electronically Signed   By: Fidela Salisbury M.D.   On: 04/06/2018 16:35        Scheduled Meds:  pantoprazole (PROTONIX) IV  40 mg Intravenous QHS   Continuous Infusions:  sodium chloride 75 mL/hr at 04/07/18 1707   ciprofloxacin 400 mg (04/08/18 1039)   metronidazole 500 mg (04/08/18 0800)   sodium chloride       LOS: 1 day     Georgette Shell, MD [facility] If 7PM-7AM, please contact night-coverage [email or web address] Password [facility] 04/08/2018, 11:20 AM

## 2018-04-09 MED ORDER — METRONIDAZOLE 500 MG PO TABS: 500.0000 mg | ORAL_TABLET | Freq: Three times a day (TID) | ORAL | 0 refills | Status: AC

## 2018-04-09 MED ORDER — CIPROFLOXACIN HCL 500 MG PO TABS: 500.0000 mg | ORAL_TABLET | Freq: Two times a day (BID) | ORAL | 0 refills | Status: AC

## 2018-04-09 MED ORDER — CIPROFLOXACIN HCL 500 MG PO TABS: 500.0000 mg | ORAL_TABLET | Freq: Two times a day (BID) | ORAL | 0 refills | Status: DC

## 2018-04-09 MED ORDER — SACCHAROMYCES BOULARDII 250 MG PO CAPS: 250.0000 mg | ORAL_CAPSULE | Freq: Two times a day (BID) | ORAL | 0 refills | Status: DC

## 2018-04-09 MED ORDER — METRONIDAZOLE 500 MG PO TABS: 500.0000 mg | ORAL_TABLET | Freq: Three times a day (TID) | ORAL | 0 refills | Status: DC

## 2018-04-09 MED ORDER — SACCHAROMYCES BOULARDII 250 MG PO CAPS
250.0000 mg | ORAL_CAPSULE | Freq: Two times a day (BID) | ORAL | Status: DC
  Administered 2018-04-09: 250 mg via ORAL
  Filled 2018-04-09: qty 1

## 2018-04-09 NOTE — Care Management Important Message (Signed)
Important Message  Patient Details  Name: [name] [name] MRN: [id number] Date of Birth: 30-Mar-1925   Medicare Important Message Given:  Yes    Kerin Salen 04/09/2018, 12:18 [place] Message  Patient Details  Name: [name] [name] MRN: [id number] Date of Birth: 09-Sep-1925   Medicare Important Message Given:  Yes    Kerin Salen 04/09/2018, 12:18 PM

## 2018-04-09 NOTE — Discharge Summary (Signed)
Physician Discharge Summary  [name] [name] KDT:[id number] DOB: 03-06-1925 DOA: 04/06/2018  PCP: Debbrah Alar, NP  Admit date: 04/06/2018 Discharge date: 04/09/2018  Admitted From:home Disposition:  home  Recommendations for Outpatient Follow-up:  1. Follow up with PCP in 1-2 weeks 2. Please obtain BMP/CBC/mag/phos in one week to follow-up on her hemoglobin white count platelets 3. Please follow up with dr Watt Climes  Home Health:pt Equipment/Devices:none  Discharge Gle[place] full Diet recommendation:  regular  Brief/Interim Summary:85 y.o.femalewith medical history significant ofchronic abdominal pain, chronic diastolic CHF, choledocholithiasis, chronic anemia, chronic back pain, COPD on home oxygen, history of daily headaches, history of healthcare associated pneumonia, GERD, hiatal hernia, hyperlipidemia, hypertension, osteoarthritis, osteoporosis, history of pancolitis, history of pancreatitis, PUD, recurrent UTI, scoliosis who is being brought to the emergency department due to several episodes of nausea and emesis since this morning.She is currently somnolent and unable to provide much information. The history is taken from her daughters and granddaughter. They stated that she was in her usual state of health yesterday. She is normally very active and fully oriented. Family also mentioned that she has had 7 previous sepsis episodes due to UTI.   ED Course:Initial temperature 100.1 F I, pulse 130, respirations 19, blood pressure 190/104 mmHg and O2 sat 93% on room air. The patient received ibuprofen 400 mg p.o. x1, at least 3000 mL of NS bolus, Zofran 4 mg IVP x2, metronidazole, cefepime and vancomycin.  Urinalysis shows small hemoglobinuria, ketonuria of 5 and proteinuria of 30 mg/dL. Other values were normal. CBC shows a white count of 13.1 with 90% neutrophils, 6% lymphocytes and 4% monocytes. Hemoglobin was 16.0 g/dL and platelets 265. Lactic acid  was normal. Lipase was 29. Her CMP had a glucose of 168 mg/dL, but all other values were normal. Phosphorus was 3.0 and magnesium 1.6 mg/dL. Blood cultures were drawn.  Imaging:her abdomen/chest radiograph show borderline gaseous distention of small bowel loops in the central abdomen with associated mucosal thickening, small bowel ileus versus early incomplete small bowel obstruction. CT abdomen and pelvis with contrast showed pneumobilia, usually associated with prior sphincterectomy, mild dilatation of the proximal jejunum with evidence of statuses and mild mucosal thickening suggestive of infectious/inflammatory changes. There is mild nonspecific mucosal thickening of the redundant descending colon, likely due to colitis. There is moderate amount of water density abdominal pelvic ascites. Please see images and full radiology report for further detail   Discharge Diagnoses:  Principal Problem:   Acute colitis Active Problems:   HYPERCHOLESTEROLEMIA   Chronic diastolic CHF (congestive heart failure) (HCC)   GERD   COPD (chronic obstructive pulmonary disease) ([place])   Sepsis ([place])   Polycythemia   Hypomagnesemia   Hyperglycemia   Abdominal distension   Sepsis due to undetermined organism ([place])   #1 sepsis presented with hypotension tachycardia and fever.?Secondary to enteritisUA not very impressive. CT scan shows small bowel wall thickening consistent with infection or inflammation. Continue Cipro and Flagyl.  #2 ileus versus early obstruction patient appears comfortable with no further vomiting. CT also showed pneumobilia with ileus versus obstruction.  Discharged on Cipro and Flagyl for 7 days.  #3 Chronic diastolic CHF stable  #4 history of GERD continue IV Protonix  #5 multiple electrolyte abnormalities including hypocalcemia hypomagnesemia hypokalemia replaced     Nutrition Problem: Inadequate oral intake Etiology: vomiting, nausea    Signs/Symptoms: meal  completion < 50%     Interventions: Ensure Enlive (each supplement provides 350kcal and 20 grams of protein)  Estimated body mass  index is 20.12 kg/m as calculated from the following:   Height as of this encounter: 5\' 2"  (1.575 m).   Weight as of this encounter: 49.9 kg.  Discharge Instructions   Allergies as of 04/09/2018      Reactions   Cefdinir Diarrhea   Codeine Diarrhea, Nausea And Vomiting   REACTION: n/v/d, HA   Influenza Vaccines    Stomach pain, headache   Morphine Diarrhea, Nausea And Vomiting   REACTION: n/v/d, HA   Sulfa Antibiotics Nausea Only   Sick    Zoledronic Acid Swelling   REACTION: Severe edema      Medication List    STOP taking these medications   gabapentin 100 MG capsule Commonly known as:  NEURONTIN   GOODYS BODY PAIN PO     TAKE these medications   acetaminophen 500 MG tablet Commonly known as:  TYLENOL Take 500 mg by mouth 2 (two) times daily as needed for headache.   albuterol 108 (90 Base) MCG/ACT inhaler Commonly known as:  PROVENTIL HFA;VENTOLIN HFA Inhale 1-2 puffs into the lungs every 6 (six) hours as needed (wheezing & shortness of breath). Reported on 02/28/2015   budesonide-formoterol 160-4.5 MCG/ACT inhaler Commonly known as:  Symbicort Inhale 2 puffs 2 (two) times daily into the lungs.   butalbital-acetaminophen-caffeine 50-325-40 MG tablet Commonly known as:  FIORICET, ESGIC Take 1 tablet by mouth every 4 (four) hours as needed for headache.   Calcium 600-D 600-400 MG-UNIT tablet Generic drug:  Calcium Carbonate-Vitamin D Take 1 tablet by mouth 2 (two) times daily with a meal.   cholecalciferol 1000 units tablet Commonly known as:  VITAMIN D Take 1,000 Units by mouth daily.   ciprofloxacin 500 MG tablet Commonly known as:  Cipro Take 1 tablet (500 mg total) by mouth 2 (two) times daily for 7 days.   diclofenac sodium 1 % Gel Commonly known as:  VOLTAREN Place 1 application onto the skin 2 (two) times daily as  needed.   hyoscyamine 0.125 MG SL tablet Commonly known as:  LEVSIN SL Take 0.125 mg by mouth as needed. Abdominal pain   lactulose 10 GM/15ML solution Commonly known as:  CHRONULAC TAKE 2 TEASPOONFUL (10ML) BY MOUTH AT BEDTIME AS NEEDED FOR CONSTIPATION   metroNIDAZOLE 500 MG tablet Commonly known as:  Flagyl Take 1 tablet (500 mg total) by mouth 3 (three) times daily for 7 days.   multivitamin with minerals tablet Take 1 tablet by mouth daily.   nitroGLYCERIN 0.4 MG SL tablet Commonly known as:  Nitrostat Place 1 tablet (0.4 mg total) under the tongue every 5 (five) minutes as needed for chest pain.   omeprazole 40 MG capsule Commonly known as:  PRILOSEC Take 1 capsule (40 mg total) by mouth 2 (two) times daily.   oxyCODONE-acetaminophen 7.5-325 MG tablet Commonly known as:  PERCOCET Take 1 tablet by mouth 2 (two) times daily as needed for severe pain.   OXYGEN Inhale 2 L into the lungs as needed.   PROBIOTIC DAILY PO Take 1 tablet by mouth daily at 12 noon.   promethazine 25 MG suppository Commonly known as:  PHENERGAN Place 1 suppository (25 mg total) rectally every 6 (six) hours as needed for nausea or vomiting.   vitamin C 500 MG tablet Commonly known as:  ASCORBIC ACID Take 500 mg by mouth daily.   VITAMIN E PO Take 1 tablet by mouth daily.      Follow-up Information    Debbrah Alar, NP Follow up.  Specialty:  Internal Medicine Contact information: [place] [address] [place] [address] [phone]        Clarene Essex, MD Follow up.   Specialty:  Gastroenterology Contact information: [address] [place] [address] [phone]          Allergies  Allergen Reactions  . Cefdinir Diarrhea  . Codeine Diarrhea and Nausea And Vomiting    REACTION: n/v/d, HA  . Influenza Vaccines     Stomach pain, headache  . Morphine Diarrhea and Nausea And Vomiting    REACTION: n/v/d, HA  . Sulfa Antibiotics  Nausea Only    Sick    . Zoledronic Acid Swelling    REACTION: Severe edema    Consultations: Dr. Watt Climes  Procedures/Studies: Dg Abd 1 View  Result Date: 04/08/2018 CLINICAL DATA:  Small bowel obstruction. Enteritis. EXAM: ABDOMEN - 1 VIEW COMPARISON:  CT abdomen and pelvis 04/06/2018 FINDINGS: No dilated loops of bowel are seen to suggest obstruction. Scattered gas and a small amount of stool are present in the colon. Right upper quadrant abdominal surgical clips, a spinal stimulator, and a spinal infusion pump are noted. There is severe lumbar dextroscoliosis. IMPRESSION: No evidence of bowel obstruction. Electronically Signed   By: Logan Bores M.D.   On: 04/08/2018 08:48   Ct Abdomen Pelvis W Contrast  Result Date: 04/06/2018 CLINICAL DATA:  Abdominal distension and bilious vomiting. EXAM: CT ABDOMEN AND PELVIS WITH CONTRAST TECHNIQUE: Multidetector CT imaging of the abdomen and pelvis was performed using the standard protocol following bolus administration of intravenous contrast. CONTRAST:  3mL OMNIPAQUE IOHEXOL 300 MG/ML  SOLN COMPARISON:  Abdominal radiograph 04/06/2018 FINDINGS: Lower chest: Calcific atherosclerotic disease of the aorta. Small hiatal hernia. Improving nodular opacities in the lingula. Hepatobiliary: Moderate amount of pneumobilia. No definite liver masses. Peripheral dilation of the intrahepatic biliary ducts. Post cholecystectomy. Pancreas: Unremarkable. No pancreatic ductal dilatation or surrounding inflammatory changes. Spleen: Normal in size without focal abnormality. Adrenals/Urinary Tract: Adrenal glands are unremarkable. Kidneys are normal, without renal calculi, focal lesion, or hydronephrosis. Bladder is unremarkable. Stomach/Bowel: Normal appearance of the stomach and duodenum. Mild dilation of the proximal jejunum measuring up to 3.4 cm with evidence of stasis and mild mucosal thickening. The distal small bowel for returns to normal caliber. Formed stool  throughout the colon mild nonspecific mucosal thickening of the redundant descending colon. Vascular/Lymphatic: Aortic atherosclerosis and tortuosity of the aorta. No enlarged abdominal or pelvic lymph nodes. Reproductive: Status post hysterectomy. No adnexal masses. Other: Moderate amount of water density abdominopelvic ascites. Musculoskeletal: Dextroconvex scoliosis of the lumbosacral spine with advanced osteoarthritic changes. Spinal stimulator in place. IMPRESSION: 1. Pneumobilia, usually associated with prior sphincterotomy, stable. Please correlate clinically. 2. Mild dilation of the proximal jejunum with evidence of stasis and mild mucosal thickening, suggestive of infectious/inflammatory changes. 3. Mild nonspecific mucosal thickening of the redundant descending colon, likely due to colitis. 4. Moderate amount of water density abdominopelvic ascites, new from 02/03/2018. 5. Small hiatal hernia. Electronically Signed   By: Fidela Salisbury M.D.   On: 04/06/2018 17:35   Dg Abd 2 Views  Result Date: 03/22/2018 CLINICAL DATA:  85 year old female with left lower quadrant pain for 2 weeks. Evaluate for kidney stones/constipation. Subsequent encounter. EXAM: ABDOMEN - 2 VIEW COMPARISON:  02/03/2018 CT. FINDINGS: Moderate stool rectosigmoid region. Stool and gas throughout remainder of colon. Gas-filled top-normal size small bowel loops. Gas-filled stomach. No definitive findings of bowel obstruction. No free intraperitoneal air. Pneumobilia as noted  on prior CT. No kidney stone identified. Scoliosis convex right with superimposed degenerative change. Spine stimulating device and pain pump in place. IMPRESSION: 1. Moderate stool rectosigmoid region. Stool and gas throughout remainder of colon. Gas-filled top-normal size small bowel loops. Gas-filled stomach. No definitive findings of bowel obstruction. No free intraperitoneal air. 2. Pneumobilia as noted on prior CT. 3. No kidney stone identified.  Electronically Signed   By: Genia Del M.D.   On: 03/22/2018 19:06   Dg Abd Acute W/chest  Result Date: 04/06/2018 CLINICAL DATA:  Nausea vomiting. EXAM: DG ABDOMEN ACUTE W/ 1V CHEST COMPARISON:  03/22/2018 FINDINGS: Borderline gaseous distension of small bowel loops in the central abdomen with associated mucosal thickening. Moderate large amount of formed stool throughout the colon with preserved colonic bowel gas pattern. Question sliver of free gas outlines the liver shadow. Spinal stimulators noted.  Post cholecystectomy. Normal cardiac silhouette. Calcific atherosclerotic disease of the aorta. No evidence of airspace consolidation. IMPRESSION: 1. Borderline gaseous distension of small bowel loops in the central abdomen with associated mucosal thickening, small bowel ileus versus early/incomplete small bowel obstruction. 2. Constipation. 3. Question sliver of free intra-abdominal gas. Confirmation with decubital radiograph, left-side-down may be considered. Electronically Signed   By: Fidela Salisbury M.D.   On: 04/06/2018 16:35    (Echo, Carotid, EGD, Colonoscopy, ERCP)    Subjective:   Discharge Exam: Vitals:   04/08/18 2032 04/09/18 0626  BP: 138/71 134/79  Pulse: 88 74  Resp: 16 16  Temp: 98.3 F (36.8 C) 98.3 F (36.8 C)  SpO2: 100% 98%   Vitals:   04/08/18 0704 04/08/18 1343 04/08/18 2032 04/09/18 0626  BP: (!) 119/54 (!) 141/69 138/71 134/79  Pulse: 80 86 88 74  Resp: 17 14 16 16   Temp: 98.4 F (36.9 C) 98.3 F (36.8 C) 98.3 F (36.8 C) 98.3 F (36.8 C)  TempSrc: Oral Oral Oral Oral  SpO2: 97% 100% 100% 98%  Weight:      Height:        General: Pt is alert, awake, not in acute distress Cardiovascular: RRR, S1/S2 +, no rubs, no gallops Respiratory: CTA bilaterally, no wheezing, no rhonchi Abdominal: Soft, NT, ND, bowel sounds + Extremities: no edema, no cyanosis    The results of significant diagnostics from this hospitalization (including imaging,  microbiology, ancillary and laboratory) are listed below for reference.     Microbiology: Recent Results (from the past 240 hour(s))  Blood Culture (routine x 2)     Status: None (Preliminary result)   Collection Time: 04/06/18  2:19 PM  Result Value Ref Range Status   Specimen Description   Final    BLOOD LEFT HAND Performed at [facility], [place] [address], [place], [place] [address]    Special Requests   Final    BOTTLES DRAWN AEROBIC ONLY Blood Culture adequate volume Performed at [facility] [address] [place], [place] [address]    Culture   Final    NO GROWTH 2 DAYS Performed at [facility] [address], [place], [place] [address]    Report Status PENDING  Incomplete  Blood Culture (routine x 2)     Status: None (Preliminary result)   Collection Time: 04/06/18  2:19 PM  Result Value Ref Range Status   Specimen Description   Final    BLOOD LEFT ANTECUBITAL Performed at [facility] [address], [place],  [address]    Special Requests  Final    BOTTLES DRAWN AEROBIC AND ANAEROBIC Blood Culture adequate volume Performed at [facility] [address] [place], [place] [address]    Culture   Final    NO GROWTH 2 DAYS Performed at [facility] [address] [place], [place] [address]    Report Status PENDING  Incomplete  Urine Culture     Status: None   Collection Time: 04/06/18  5:48 PM  Result Value Ref Range Status   Specimen Description   Final    URINE, CATHETERIZED Performed at [facility] [address], [place], [place] [address]    Special Requests   Final    NONE Performed at [facility], [place] [address] [place], [place] [address]    Culture   Final    NO GROWTH Performed at [facility], [place] [address] [place],  [address]    Report Status 04/08/2018 FINAL  Final     Labs: BNP (last 3  results) Recent Labs    02/03/18 2027  BNP 130.8*   Basic Metabolic Panel: Recent Labs  Lab 04/06/18 1419 04/06/18 1435 04/07/18 1049 04/08/18 0421  NA 140  --  141 140  K 3.9  --  3.6 3.9  CL 99  --  112* 112*  CO2 29  --  26 25  GLUCOSE 168*  --  94 83  BUN 20  --  15 9  CREATININE 0.84  --  0.68 0.49  CALCIUM 9.2  --  6.5* 6.8*  MG  --  1.6*  --  2.1  PHOS  --  3.0  --   --    Liver Function Tests: Recent Labs  Lab 04/06/18 1419 04/07/18 1049 04/08/18 0421  AST 26 15 16   ALT 14 8 11   ALKPHOS 80 42 42  BILITOT 0.4 0.4 0.5  PROT 6.9 4.4* 4.8*  ALBUMIN 3.8 2.4* 2.6*   Recent Labs  Lab 04/06/18 1419  LIPASE 29   No results for input(s): AMMONIA in the last 168 hours. CBC: Recent Labs  Lab 04/06/18 1419 04/07/18 1005 04/08/18 0421  WBC 13.1* 3.8* 3.6*  NEUTROABS 11.7*  --   --   HGB 16.0* 10.5* 10.8*  HCT 51.0* 34.9* 36.5  MCV 96.4 100.9* 102.8*  PLT 265 122* 116*   Cardiac Enzymes: No results for input(s): CKTOTAL, CKMB, CKMBINDEX, TROPONINI in the last 168 hours. BNP: Invalid input(s): POCBNP CBG: No results for input(s): GLUCAP in the last 168 hours. D-Dimer No results for input(s): DDIMER in the last 72 hours. Hgb A1c No results for input(s): HGBA1C in the last 72 hours. Lipid Profile No results for input(s): CHOL, HDL, LDLCALC, TRIG, CHOLHDL, LDLDIRECT in the last 72 hours. Thyroid function studies No results for input(s): TSH, T4TOTAL, T3FREE, THYROIDAB in the last 72 hours.  Invalid input(s): FREET3 Anemia work up No results for input(s): VITAMINB12, FOLATE, FERRITIN, TIBC, IRON, RETICCTPCT in the last 72 hours. Urinalysis    Component Value Date/Time   COLORURINE YELLOW 04/06/2018 [place] 04/06/2018 1748   LABSPEC 1.021 04/06/2018 1748   PHURINE 5.0 04/06/2018 [place] 04/06/2018 [place] 03/22/2018 1323   HGBUR SMALL (A) 04/06/2018 [place] 04/06/2018 1748    BILIRUBINUR 1+ 02/16/2018 1234   KETONESUR 5 (A) 04/06/2018 1748   PROTEINUR 30 (A) 04/06/2018 1748   UROBILINOGEN 0.2 03/22/2018 1323   NITRITE  NEGATIVE 04/06/2018 [place] 04/06/2018 1748   Sepsis Labs Invalid input(s): PROCALCITONIN,  WBC,  LACTICIDVEN Microbiology Recent Results (from the past 240 hour(s))  Blood Culture (routine x 2)     Status: None (Preliminary result)   Collection Time: 04/06/18  2:19 PM  Result Value Ref Range Status   Specimen Description   Final    BLOOD LEFT HAND Performed at [facility] [address], [place], [place] [address]    Special Requests   Final    BOTTLES DRAWN AEROBIC ONLY Blood Culture adequate volume Performed at [facility] [address] [place], [place] [address]    Culture   Final    NO GROWTH 2 DAYS Performed at [facility] [address] [place], [place] [address]    Report Status PENDING  Incomplete  Blood Culture (routine x 2)     Status: None (Preliminary result)   Collection Time: 04/06/18  2:19 PM  Result Value Ref Range Status   Specimen Description   Final    BLOOD LEFT ANTECUBITAL Performed at [facility] [address], [place], [place] [address]    Special Requests   Final    BOTTLES DRAWN AEROBIC AND ANAEROBIC Blood Culture adequate volume Performed at [facility] [address], [place], [place] [address]    Culture   Final    NO GROWTH 2 DAYS Performed at [facility] [address] [place], [place] [address]    Report Status PENDING  Incomplete  Urine Culture     Status: None   Collection Time: 04/06/18  5:48 PM  Result Value Ref Range Status   Specimen Description   Final    URINE, CATHETERIZED Performed at [facility] [address], [place], [place] [address]    Special Requests   Final    NONE Performed at [facility], [place] [address] [place], [place] [address]    Culture   Final    NO GROWTH Performed at [facility], [place] [address], [place],  [address]    Report Status 04/08/2018 FINAL  Final     Time coordinating discharge: 33  minutes  SIGNED:   Georgette Shell, MD  [facility] 04/09/2018, 11:39 AM Pager   If 7PM-7AM, please contact night-coverage [email or web address] Password [email or web address]

## 2018-04-09 NOTE — Progress Notes (Signed)
[name] [name] 9:12 AM  Subjective: Patient seen and examined and discussed with the hospital team and her hospital computer chart reviewed and she is well-known to me from years of intermittent similar GI issues currently she is doing well and we are advancing her diet and she is not having any pain and is moving her bowels is not having any more nausea or vomiting  Objective: Vital signs stable afebrile no acute distress abdomen is soft nontender good bowel sounds yesterday's labs okay CT reviewed yesterday's KUB okay  Assessment: Improved GI issues questionable periodic ischemia versus adhesions  Plan: Okay to advance diet and hopefully go home soon and please call us if we could be of any further assistance with this hospital stay otherwise follow-up with me in the office as needed or in a few weeks  [facility]  Pager [phone] After 5PM or if no answer call [phone]

## 2018-04-09 NOTE — TOC Progression Note (Signed)
Transition of Care [facility]) - Progression Note    Patient Details  Name: Arlone Lenhardt MRN: [id number] Date of Birth: 05-27-1925  Transition of Care [facility]) CM/SW Contact  Purcell Mouton, RN Phone Number: 04/09/2018, 3:31 PM  Clinical Narrative:             Expected Discharge Plan and Services  Pt declined [facility]. A call to pt's daughter [name] agreed with pt not having HH. Daughter [name] and brother will be with pt at home.        Expected Discharge Date: (unknown)                         Social Determinants of Health (SDOH) Interventions    Readmission Risk Interventions 30 Day Unplanned Readmission Risk Score     ED to Hosp-Admission (Current) from 04/06/2018 in [facility]  30 Day Unplanned Readmission Risk Score (%)  21 Filed at 04/09/2018 1200     This score is the patient's risk of an unplanned readmission within 30 days of being discharged (0 -100%). The score is based on dignosis, age, lab data, medications, orders, and past utilization.   Low:  0-14.9   Medium: 15-21.9   High: 22-29.9   Extreme: 30 and above       No flowsheet data found.

## 2018-04-11 LAB — CULTURE, BLOOD (ROUTINE X 2)
Culture: NO GROWTH
Culture: NO GROWTH
Special Requests: ADEQUATE
Special Requests: ADEQUATE

## 2018-04-12 ENCOUNTER — Telehealth: Payer: Self-pay | Admitting: *Deleted

## 2018-04-12 NOTE — Telephone Encounter (Signed)
Transition Care Management Follow-up Telephone Call   Date discharged?04/09/18  How have you been since you were released from the hospital? "pretty good"   Do you understand why you were in the hospital? yes   Do you understand the discharge instructions? yes   Where were you discharged to? home   Items Reviewed:  Medications reviewed: pt reports her dtr will bring list with them to PCP appt  Allergies reviewed: yes  Dietary changes reviewed: yes  Referrals reviewed: yes   Functional Questionnaire:   Activities of Daily Living (ADLs):   She states they are independent in the following: ambulation, bathing and hygiene, feeding, continence, grooming, toileting and dressing States they require assistance with the following: reports she has cane and walker, but does not use them in the hourse   Any transportation issues/concerns?: no   Any patient concerns? no   Confirmed importance and date/time of follow-up visits scheduled pt states dtr is at the grocery store, but will have her call to schedule appt upon return  Confirmed with patient if condition begins to worsen call PCP or go to the ER.  Patient was given the office number and encouraged to call back with question or concerns.  : yes

## 2018-04-20 DIAGNOSIS — M545 Low back pain: Secondary | ICD-10-CM | POA: Diagnosis not present

## 2018-04-20 DIAGNOSIS — G8929 Other chronic pain: Secondary | ICD-10-CM | POA: Diagnosis not present

## 2018-04-20 DIAGNOSIS — Z978 Presence of other specified devices: Secondary | ICD-10-CM | POA: Diagnosis not present

## 2018-04-20 DIAGNOSIS — M419 Scoliosis, unspecified: Secondary | ICD-10-CM | POA: Diagnosis not present

## 2018-04-26 ENCOUNTER — Encounter: Payer: Self-pay | Admitting: Family

## 2018-04-27 ENCOUNTER — Ambulatory Visit (INDEPENDENT_AMBULATORY_CARE_PROVIDER_SITE_OTHER): Payer: Medicare Other | Admitting: Family

## 2018-04-27 DIAGNOSIS — R197 Diarrhea, unspecified: Secondary | ICD-10-CM

## 2018-04-27 NOTE — Telephone Encounter (Addendum)
Spoke with pt's daughter. She states she is unable to assist mother with a video visit. Pt must be in [place] for surgery at 5:30am tomorrow. She states that pt has been taking Immodium and started taking Levsin that GI gave to pt previously for as needed abdominal pain. She was unsure if symptoms had improved. I called pt. She states she took Immodium twice yesterday and has taken Levsin and has NOT had any diarrhea since 11am yesterday.  Please see mychart message and advise? [name]'s cell# is [phone].

## 2018-04-27 NOTE — Telephone Encounter (Signed)
Jiles Prows, CMA  Sent: Tue April 27, 2018 2:38 PM  To: Mailyn Steichen, [place], [place]      Message   Appointment today at 5:40 via [facility] with Para March

## 2018-04-27 NOTE — Progress Notes (Signed)
Virtual Visit via Video Note  I connected with [name] [name] on 04/27/18 at  5:40 PM EDT by a video enabled telemedicine application and verified that I am speaking with the correct person using two identifiers.   I discussed the limitations of evaluation and management by telemedicine and the availability of in person appointments. The patient expressed understanding and agreed to proceed.  Only the patient and myself were on today's video visit. The patient was at home and I was in my office at the time of today's visit.   History of Present Illness:  Patient is a 85 yr old female who presents today via telephone with chief complaint of diarrhea.  Of note she was admitted 04/06/18 with colitis/sepsis. She was treated with cipro/flagyl orally for 7 days following her discharge.  Patient reports feeling weak and trembling.  Reports that she moved her bowels twice today- soft stools.  Reports that she is drinking but minimal intake of food.  Denies fever.  Reports occasional nausea with pain.  Denies vomiting. Denies abdominal pain.    Reports that yesterday she had 4-5 loose stools. Was the same the day before that.  Patient and daughter [name] [name] were present for today's phone visit.    Reports that she is scheduled for 6:30 AM tomorrow for a procedure under conscious sedation for a battery exchange in her pain pump.     Observations/Objective:   Gen: Awake, alert, frail appearing elderly white female in no acute distress Resp: Breathing is even and non-labored Psych: calm/pleasant demeanor Neuro: Alert and Oriented x 3, + facial symmetry, speech is clear.  Assessment and Plan:  Diarrhea-  Appears to be improving. Discussed that C diff is a possibility but that the fact that she was recently treated with metronidazole decreases this chance.  I advised pt that I really think she needs some additional lab work including blood work and stool studies.  In the meantime I have advised pt and  daughter that she should she have worsening diarrhea overnight or increased weakness that she should contact her surgeon and cancel the procedure for tomorrow. They both verbalize understanding.  Daughter wishes to have the patient come in for lab work on Thursday instead of tomorrow so she can observe her all day tomorrow.  I advised then that if she develops increased weakness/fever, abdominal pain that she should proceed to the ED.  Follow Up Instructions:    I discussed the assessment and treatment plan with the patient. The patient was provided an opportunity to ask questions and all were answered. The patient agreed with the plan and demonstrated an understanding of the instructions.   The patient was advised to call back or seek an in-person evaluation if the symptoms worsen or if the condition fails to improve as anticipated.    A total of 25  minutes were spent face-to-face with the patient during this encounter and over half of that time was spent on counseling and coordination of care. The patient was counseled on follow up plan, red flags that should prompt return, C. Diff.  Nance Pear, NP

## 2018-04-27 NOTE — Progress Notes (Deleted)
Virtual Visit via Telephone Note  I connected with [name] on 04/27/18 at  5:40 PM EDT by telephone and verified that I am speaking with the correct person using two identifiers.   I discussed the limitations, risks, security and privacy concerns of performing an evaluation and management service by telephone and the availability of in person appointments. I also discussed with the patient that there may be a patient responsible charge related to this service. The patient expressed understanding and agreed to proceed.   History of Present Illness:  Patient is a 85 yr old female who presents today via telephone with chief complaint of diarrhea.  Of note she was admitted 04/06/18 with colitis/sepsis. She was treated with cipro/flagyl.    Patient reports feeling weak and trembling.  Reports that she moved her bowels twice today- soft stools.  Reports that she is drinking but minimal intake of food.  Denies fever.  Reports occasional nausea with pain.  Denies vomiting. Denies abdominal pain.    Reports that yesterday she had 4-5 loose stools. Was the same the day before that.  Patient and daughter [name] were present for today's phone visit.    Reports that she is scheduled for 6:30 AM tomorrow.    Observations/Objective:   Assessment and Plan:   Follow Up Instructions:    I discussed the assessment and treatment plan with the patient. The patient was provided an opportunity to ask questions and all were answered. The patient agreed with the plan and demonstrated an understanding of the instructions.   The patient was advised to call back or seek an in-person evaluation if the symptoms worsen or if the condition fails to improve as anticipated.  I provided *** minutes of non-face-to-face time during this encounter.   Nance Pear, NP

## 2018-04-28 DIAGNOSIS — K219 Gastro-esophageal reflux disease without esophagitis: Secondary | ICD-10-CM | POA: Diagnosis not present

## 2018-04-28 DIAGNOSIS — J449 Chronic obstructive pulmonary disease, unspecified: Secondary | ICD-10-CM | POA: Diagnosis not present

## 2018-04-28 DIAGNOSIS — M069 Rheumatoid arthritis, unspecified: Secondary | ICD-10-CM | POA: Diagnosis not present

## 2018-04-28 DIAGNOSIS — Z888 Allergy status to other drugs, medicaments and biological substances status: Secondary | ICD-10-CM | POA: Diagnosis not present

## 2018-04-28 DIAGNOSIS — Z887 Allergy status to serum and vaccine status: Secondary | ICD-10-CM | POA: Diagnosis not present

## 2018-04-28 DIAGNOSIS — Z885 Allergy status to narcotic agent status: Secondary | ICD-10-CM | POA: Diagnosis not present

## 2018-04-28 DIAGNOSIS — Z881 Allergy status to other antibiotic agents status: Secondary | ICD-10-CM | POA: Diagnosis not present

## 2018-04-28 DIAGNOSIS — Z451 Encounter for adjustment and management of infusion pump: Secondary | ICD-10-CM | POA: Diagnosis not present

## 2018-04-28 DIAGNOSIS — I10 Essential (primary) hypertension: Secondary | ICD-10-CM | POA: Diagnosis not present

## 2018-04-28 DIAGNOSIS — G893 Neoplasm related pain (acute) (chronic): Secondary | ICD-10-CM | POA: Diagnosis not present

## 2018-04-28 DIAGNOSIS — G894 Chronic pain syndrome: Secondary | ICD-10-CM | POA: Diagnosis not present

## 2018-04-28 DIAGNOSIS — Z87891 Personal history of nicotine dependence: Secondary | ICD-10-CM | POA: Diagnosis not present

## 2018-04-28 DIAGNOSIS — Z882 Allergy status to sulfonamides status: Secondary | ICD-10-CM | POA: Diagnosis not present

## 2018-04-28 DIAGNOSIS — M5136 Other intervertebral disc degeneration, lumbar region: Secondary | ICD-10-CM | POA: Diagnosis not present

## 2018-04-28 DIAGNOSIS — Z48811 Encounter for surgical aftercare following surgery on the nervous system: Secondary | ICD-10-CM | POA: Diagnosis not present

## 2018-05-05 DIAGNOSIS — R197 Diarrhea, unspecified: Secondary | ICD-10-CM | POA: Diagnosis not present

## 2018-05-07 ENCOUNTER — Emergency Department (HOSPITAL_BASED_OUTPATIENT_CLINIC_OR_DEPARTMENT_OTHER)
Admission: EM | Admit: 2018-05-07 | Discharge: 2018-05-07 | Disposition: A | Discharge status: Home / Self Care | Payer: Medicare Other | Attending: Emergency Medicine | Admitting: Emergency Medicine

## 2018-05-07 ENCOUNTER — Other Ambulatory Visit: Payer: Self-pay

## 2018-05-07 ENCOUNTER — Encounter (HOSPITAL_BASED_OUTPATIENT_CLINIC_OR_DEPARTMENT_OTHER): Payer: Self-pay | Admitting: Adult Health

## 2018-05-07 ENCOUNTER — Emergency Department (HOSPITAL_BASED_OUTPATIENT_CLINIC_OR_DEPARTMENT_OTHER): Payer: Medicare Other

## 2018-05-07 DIAGNOSIS — R531 Weakness: Secondary | ICD-10-CM | POA: Insufficient documentation

## 2018-05-07 DIAGNOSIS — N182 Chronic kidney disease, stage 2 (mild): Secondary | ICD-10-CM | POA: Insufficient documentation

## 2018-05-07 DIAGNOSIS — Z8719 Personal history of other diseases of the digestive system: Secondary | ICD-10-CM | POA: Insufficient documentation

## 2018-05-07 DIAGNOSIS — I130 Hypertensive heart and chronic kidney disease with heart failure and stage 1 through stage 4 chronic kidney disease, or unspecified chronic kidney disease: Secondary | ICD-10-CM | POA: Insufficient documentation

## 2018-05-07 DIAGNOSIS — E876 Hypokalemia: Secondary | ICD-10-CM

## 2018-05-07 DIAGNOSIS — E860 Dehydration: Secondary | ICD-10-CM | POA: Diagnosis not present

## 2018-05-07 DIAGNOSIS — J449 Chronic obstructive pulmonary disease, unspecified: Secondary | ICD-10-CM | POA: Diagnosis not present

## 2018-05-07 DIAGNOSIS — Z79899 Other long term (current) drug therapy: Secondary | ICD-10-CM | POA: Insufficient documentation

## 2018-05-07 DIAGNOSIS — R000 Tachycardia, unspecified: Secondary | ICD-10-CM | POA: Diagnosis not present

## 2018-05-07 DIAGNOSIS — R197 Diarrhea, unspecified: Secondary | ICD-10-CM | POA: Diagnosis not present

## 2018-05-07 DIAGNOSIS — Z87891 Personal history of nicotine dependence: Secondary | ICD-10-CM | POA: Diagnosis not present

## 2018-05-07 DIAGNOSIS — I5032 Chronic diastolic (congestive) heart failure: Secondary | ICD-10-CM | POA: Diagnosis not present

## 2018-05-07 DIAGNOSIS — R509 Fever, unspecified: Secondary | ICD-10-CM | POA: Diagnosis not present

## 2018-05-07 DIAGNOSIS — R109 Unspecified abdominal pain: Secondary | ICD-10-CM | POA: Diagnosis not present

## 2018-05-07 LAB — COMPREHENSIVE METABOLIC PANEL
ALT: 8 U/L (ref 0–44)
AST: 14 U/L — ABNORMAL LOW (ref 15–41)
Albumin: 3.1 g/dL — ABNORMAL LOW (ref 3.5–5.0)
Alkaline Phosphatase: 67 U/L (ref 38–126)
Anion gap: 7 (ref 5–15)
BUN: 20 mg/dL (ref 8–23)
CO2: 26 mmol/L (ref 22–32)
Calcium: 7.7 mg/dL — ABNORMAL LOW (ref 8.9–10.3)
Chloride: 105 mmol/L (ref 98–111)
Creatinine, Ser: 0.52 mg/dL (ref 0.44–1.00)
GFR calc Af Amer: >60 mL/min (ref >60)
GFR calc non Af Amer: >60 mL/min (ref >60)
Glucose, Bld: 120 mg/dL — ABNORMAL HIGH (ref 70–99)
Potassium: 3.3 mmol/L — ABNORMAL LOW (ref 3.5–5.1)
Sodium: 138 mmol/L (ref 135–145)
Total Bilirubin: 0.4 mg/dL (ref 0.3–1.2)
Total Protein: 5.8 g/dL — ABNORMAL LOW (ref 6.5–8.1)

## 2018-05-07 LAB — CBC WITH DIFFERENTIAL/PLATELET
Abs Immature Granulocytes: 0.06 10*3/uL (ref 0.00–0.07)
Basophils Absolute: 0 10*3/uL (ref 0.0–0.1)
Basophils Relative: 0 %
Eosinophils Absolute: 0 10*3/uL (ref 0.0–0.5)
Eosinophils Relative: 0 %
HCT: 42.9 % (ref 36.0–46.0)
Hemoglobin: 13.7 g/dL (ref 12.0–15.0)
Immature Granulocytes: 1 %
Lymphocytes Relative: 12 %
Lymphs Abs: 1 10*3/uL (ref 0.7–4.0)
MCH: 30.3 pg (ref 26.0–34.0)
MCHC: 31.9 g/dL (ref 30.0–36.0)
MCV: 94.9 fL (ref 80.0–100.0)
Monocytes Absolute: 0.7 10*3/uL (ref 0.1–1.0)
Monocytes Relative: 9 %
Neutro Abs: 6.6 10*3/uL (ref 1.7–7.7)
Neutrophils Relative %: 78 %
Platelets: 245 10*3/uL (ref 150–400)
RBC: 4.52 MIL/uL (ref 3.87–5.11)
RDW: 14.3 % (ref 11.5–15.5)
WBC: 8.3 10*3/uL (ref 4.0–10.5)
nRBC: 0 % (ref 0.0–0.2)

## 2018-05-07 LAB — URINALYSIS, ROUTINE W REFLEX MICROSCOPIC
Bilirubin Urine: NEGATIVE
Glucose, UA: NEGATIVE mg/dL
Ketones, ur: 15 mg/dL — AB
Nitrite: NEGATIVE
Protein, ur: NEGATIVE mg/dL
Specific Gravity, Urine: 1.02 (ref 1.005–1.030)
pH: 6.5 (ref 5.0–8.0)

## 2018-05-07 LAB — URINALYSIS, MICROSCOPIC (REFLEX)

## 2018-05-07 LAB — LIPASE, BLOOD: Lipase: 47 U/L (ref 11–51)

## 2018-05-07 LAB — PROTIME-INR
INR: 1.3 — ABNORMAL HIGH (ref 0.8–1.2)
Prothrombin Time: 16.3 seconds — ABNORMAL HIGH (ref 11.4–15.2)

## 2018-05-07 LAB — LACTIC ACID, PLASMA: Lactic Acid, Venous: 1 mmol/L (ref 0.5–1.9)

## 2018-05-07 MED ORDER — CIPROFLOXACIN HCL 500 MG PO TABS: 500.0000 mg | ORAL_TABLET | Freq: Two times a day (BID) | ORAL | 0 refills | Status: DC

## 2018-05-07 MED ORDER — ACETAMINOPHEN 325 MG PO TABS
ORAL_TABLET | ORAL | Status: AC
  Filled 2018-05-07: qty 2

## 2018-05-07 MED ORDER — SODIUM CHLORIDE 0.9 % IV BOLUS (SEPSIS)
250.0000 mL | Freq: Once | INTRAVENOUS | Status: AC
  Administered 2018-05-07: 250 mL via INTRAVENOUS

## 2018-05-07 MED ORDER — SODIUM CHLORIDE 0.9% FLUSH
3.0000 mL | Freq: Once | INTRAVENOUS | Status: DC
  Filled 2018-05-07: qty 3

## 2018-05-07 MED ORDER — ACETAMINOPHEN 325 MG PO TABS
650.0000 mg | ORAL_TABLET | Freq: Once | ORAL | Status: AC
  Administered 2018-05-07: 650 mg via ORAL

## 2018-05-07 MED ORDER — METOPROLOL SUCCINATE ER 25 MG PO TB24: 25.0000 mg | ORAL_TABLET | Freq: Two times a day (BID) | ORAL | 0 refills | Status: DC

## 2018-05-07 MED ORDER — SODIUM CHLORIDE 0.9 % IV BOLUS (SEPSIS)
1000.0000 mL | Freq: Once | INTRAVENOUS | Status: AC
  Administered 2018-05-07: 13:00:00 1000 mL via INTRAVENOUS

## 2018-05-07 MED ORDER — IOHEXOL 300 MG/ML  SOLN
100.0000 mL | Freq: Once | INTRAMUSCULAR | Status: AC | PRN
  Administered 2018-05-07: 85 mL via INTRAVENOUS

## 2018-05-07 MED ORDER — METOPROLOL TARTRATE 50 MG PO TABS
25.0000 mg | ORAL_TABLET | Freq: Once | ORAL | Status: AC
  Administered 2018-05-07: 25 mg via ORAL
  Filled 2018-05-07: qty 1

## 2018-05-07 MED ORDER — POTASSIUM CHLORIDE 10 MEQ/100ML IV SOLN
10.0000 meq | INTRAVENOUS | Status: AC
  Administered 2018-05-07 (×2): 10 meq via INTRAVENOUS
  Filled 2018-05-07 (×2): qty 100

## 2018-05-07 NOTE — ED Provider Notes (Signed)
Patient is a 85 year old female with history of recent admission for colitis presenting with complaints of abdominal discomfort and low-grade fever for the past few days.  She was initially seen by Dr. Melina Copa, then care was signed out to me awaiting results of a CT scan.  CT scan was read by radiology as basically normal with resolution of the prior colitis findings.  Patient appears clinically well.  Her work-up shows no other significant abnormality with the exception of a low potassium.  She was given IV replacement.  She was also noted to have blood pressure intermittently above 747 systolic.  Most recent measurements are 185B systolic.  At this point, I feel as though the patient is appropriate for discharge.  Her daughter is a Marine scientist and will monitor her at home.  If she experiences additional problems, she is to return to the ER to be reevaluated.     Veryl Speak, MD 05/07/18 1715

## 2018-05-07 NOTE — ED Notes (Signed)
ED Provider at bedside. 

## 2018-05-07 NOTE — Discharge Instructions (Addendum)
Begin taking Metoprolol and cipro as prescribed.  Continue other medications as previously prescribed.  Return to the emergency department if you experience any new and/or concerning symptoms.

## 2018-05-07 NOTE — ED Provider Notes (Signed)
[facility]GENCY DEPARTMENT Provider Note   CSN: [id number] Arrival date & time: 05/07/18  1243    History   Chief Complaint Chief Complaint  Patient presents with   Fever    HPI [name] [name] is a 85 y.o. female.  She is a complicated medical history including CHF COPD sepsis colitis.  She was recently hospitalized at the end of last month for sepsis and colitis.  She has had on and off fever abdominal pain weakness sometimes with diarrhea over the past week plus.  The family has been giving her IV fluids intermittently and she may be back taking metronidazole again for her GI symptoms.  She had a recent C. difficile that was negative.  She also had to get a battery replaced on her pain pump and has a fresh surgical wound in her lower abdomen.  There is been low-grade fevers but not much for respiratory symptoms.  She has had nausea and decreased appetite and some intermittent abdominal pain.  No urinary symptoms with history of UTI/sepsis.  No recent travel but she is been in and out of the hospital.     The history is provided by the patient and a relative.  Fever  Max temp prior to arrival:  100 Temp source:  Oral Severity:  Moderate Onset quality:  Gradual Timing:  Intermittent Progression:  Unchanged Chronicity:  Recurrent Associated symptoms: diarrhea, headaches and nausea   Associated symptoms: no chest pain, no chills, no congestion, no cough, no dysuria and no rash     Past Medical History:  Diagnosis Date   Abdominal pain    Resolved   Cardiac arrhythmia    CHF (congestive heart failure) ([place])    Diastolic  on ECHO 5188   Choledocholithiasis    Chronic anemia    Chronic back pain    COPD (chronic obstructive pulmonary disease) (HCC)    oxygen dependent at  night 2LPM   Daily headache    GERD (gastroesophageal reflux disease)    HCAP (healthcare-associated pneumonia) 03/28/2014   Hiatal hernia    Hyperlipidemia    Hypertension      On home oxygen therapy    "1L during the day; 2L q hs" (03/28/2014)   Osteoarthritis    "pretty much q where"    Osteoporosis    Pancolitis ([place])    Infectious vs. inflammatory   Pancreatitis    History of   Pneumonia 1930's; 2011 X 2; 02/2014   PONV (postoperative nausea and vomiting)    PUD (peptic ulcer disease)    Reactive airway disease that is not asthma    Recurrent UTI (urinary tract infection)    "here lately" (03/28/2014)   Scoliosis deformity of spine    history of intractable back pain    Patient Active Problem List   Diagnosis Date Noted   Abdominal distension 04/07/2018   Sepsis due to undetermined organism ([place]) 04/07/2018   Acute colitis 04/06/2018   Polycythemia 04/06/2018   Hypomagnesemia 04/06/2018   Hyperlipidemia 04/06/2018   Hyperglycemia 04/06/2018   Protein-calorie malnutrition, severe 02/06/2018   Colitis 02/03/2018   CKD (chronic kidney disease), stage II 02/03/2018   Nausea & vomiting 02/03/2018   Abdominal pain 02/03/2018   Chronic respiratory failure ([place]) 05/17/2015   Sepsis ([place]) 11/09/2014   Aspiration pneumonia ([place]) 11/09/2014   Other allergic rhinitis 04/19/2014   COPD (chronic obstructive pulmonary disease) ([place]) 04/19/2014   Acute on chronic respiratory failure with hypoxia (H[facility])    Other pancytopenia (  [place])    Headache(784.0) 08/11/2013   Hypocalcemia 02/07/2013   Memory change 09/28/2012   Abdominal pain, chronic, epigastric 09/28/2012   Chronic tension headaches 09/15/2012   DDD (degenerative disc disease), lumbar 05/31/2012   Vitamin D deficiency 08/17/2010   Chronic back pain 06/02/2010   PANCREATIC INSUFFICIENCY 03/19/2010   ANEMIA 12/27/2009   Hearing loss 08/17/2009   Chronic pulmonary heart disease ([place]) 04/12/2009   Osteoporosis 02/12/2009   HYPERCHOLESTEROLEMIA 02/01/2009   Essential hypertension 02/01/2009   CARDIAC ARRHYTHMIA 02/01/2009   Chronic diastolic CHF  (congestive heart failure) ([place]) 02/01/2009   COPD with emphysema, gold stage C. 02/01/2009   GERD 02/01/2009   Peptic ulcer 02/01/2009   Diaphragmatic hernia 02/01/2009   OSTEOARTHRITIS 02/01/2009    Past Surgical History:  Procedure Laterality Date   ABDOMINAL HYSTERECTOMY  1970s   BACK SURGERY     x 5   BREAST CYST EXCISION Right 1960's   CERVICAL LAMINECTOMY  1970s   CHOLECYSTECTOMY  2008   ERCP W/ SPHICTEROTOMY  02/2010   ESOPHAGOGASTRODUODENOSCOPY N/A 11/14/2013   Procedure: ESOPHAGOGASTRODUODENOSCOPY (EGD);  Surgeon: Missy Sabins, MD;  Location: [facility] ENDOSCOPY;  Service: Endoscopy;  Laterality: N/A;   FLEXIBLE SIGMOIDOSCOPY N/A 01/24/2013   Procedure: FLEXIBLE SIGMOIDOSCOPY;  Surgeon: Missy Sabins, MD;  Location: [facility];  Service: Endoscopy;  Laterality: N/A;   PAIN PUMP IMPLANTATION  ~ 2010   back, dilaudid and bupravacaine   SPINAL CORD STIMULATOR IMPLANT  ~ 2009   SPINE SURGERY  1970's,1995,2007     OB History   No obstetric history on file.      Home Medications    Prior to Admission medications   Medication Sig Start Date End Date Taking? Authorizing Provider  acetaminophen (TYLENOL) 500 MG tablet Take 500 mg by mouth 2 (two) times daily as needed for headache.    [provider]  albuterol (PROVENTIL HFA;VENTOLIN HFA) 108 (90 Base) MCG/ACT inhaler Inhale 1-2 puffs into the lungs every 6 (six) hours as needed (wheezing & shortness of breath). Reported on 02/28/2015 10/09/15   Debbrah Alar, NP  Ascorbic Acid (VITAMIN C) 500 MG tablet Take 500 mg by mouth daily.      [provider]  budesonide-formoterol (SYMBICORT) 160-4.5 MCG/ACT inhaler Inhale 2 puffs 2 (two) times daily into the lungs. 12/05/16   Parrett, Fonnie Mu, NP  butalbital-acetaminophen-caffeine (FIORICET, ESGIC) 50-325-40 MG tablet Take 1 tablet by mouth every 4 (four) hours as needed for headache.     [provider]  Calcium Carbonate-Vitamin D  (CALCIUM 600-D) 600-400 MG-UNIT per tablet Take 1 tablet by mouth 2 (two) times daily with a meal.      [provider]  cholecalciferol (VITAMIN D) 1000 units tablet Take 1,000 Units by mouth daily.    [provider]  diclofenac sodium (VOLTAREN) 1 % GEL Place 1 application onto the skin 2 (two) times daily as needed. 06/06/15   [provider]  hyoscyamine (LEVSIN SL) 0.125 MG SL tablet Take 0.125 mg by mouth as needed. Abdominal pain 11/24/11   [provider]  lactulose (CHRONULAC) 10 GM/15ML solution TAKE 2 TEASPOONFUL (10ML) BY MOUTH AT BEDTIME AS NEEDED FOR CONSTIPATION 03/30/18   Debbrah Alar, NP  Multiple Vitamins-Minerals (MULTIVITAMIN WITH MINERALS) tablet Take 1 tablet by mouth daily. 07/01/15   Debbrah Alar, NP  nitroGLYCERIN (NITROSTAT) 0.4 MG SL tablet Place 1 tablet (0.4 mg total) under the tongue every 5 (five) minutes as needed for chest pain. 11/11/10   Brackbill,  Marcello Moores, MD  omeprazole (PRILOSEC) 40 MG capsule Take 1 capsule (40 mg total) by mouth 2 (two) times daily. 11/16/13   Thurnell Lose, MD  oxyCODONE-acetaminophen (PERCOCET) 7.5-325 MG tablet Take 1 tablet by mouth 2 (two) times daily as needed for severe pain.    [provider]  OXYGEN Inhale 2 L into the lungs as needed.    [provider]  Probiotic Product (PROBIOTIC DAILY PO) Take 1 tablet by mouth daily at 12 noon.    [provider]  promethazine (PHENERGAN) 25 MG suppository Place 1 suppository (25 mg total) rectally every 6 (six) hours as needed for nausea or vomiting. 02/03/18   Debbrah Alar, NP  saccharomyces boulardii (FLORASTOR) 250 MG capsule Take 1 capsule (250 mg total) by mouth 2 (two) times daily. 04/09/18   Georgette Shell, MD  VITAMIN E PO Take 1 tablet by mouth daily.    [provider]    Family History Family History  Problem Relation Age of Onset   Cancer Mother        uterine   Heart disease Father     Hypertension Other    Osteoporosis Neg Hx     Social History Social History   Tobacco Use   Smoking status: Former Smoker    Packs/day: 1.00    Years: 12.00    Pack years: 12.00    Types: Cigarettes    Last attempt to quit: 01/28/1988    Years since quitting: 30.2   Smokeless tobacco: Never Used  Substance Use Topics   Alcohol use: No   Drug use: No     Allergies   Cefdinir; Codeine; Influenza vaccines; Morphine; Sulfa antibiotics; and Zoledronic acid   Review of Systems Review of Systems  Constitutional: Positive for appetite change, fatigue and fever. Negative for chills.  HENT: Negative for congestion.   Eyes: Negative for visual disturbance.  Respiratory: Negative for cough.   Cardiovascular: Negative for chest pain.  Gastrointestinal: Positive for diarrhea and nausea.  Genitourinary: Negative for dysuria.  Musculoskeletal: Negative for joint swelling.  Skin: Negative for rash.  Neurological: Positive for headaches.     Physical Exam Updated Vital Signs BP (!) 177/94 (BP Location: Right Arm)    Pulse (!) 117    Temp 99 F (37.2 C) (Rectal)    Resp (!) 26    Ht 5' (1.524 m)    Wt 40.8 kg    SpO2 95%    BMI 17.58 kg/m   Physical Exam Vitals signs and nursing note reviewed.  Constitutional:      General: She is awake. She is not in acute distress.    Appearance: She is cachectic.  HENT:     Head: Normocephalic and atraumatic.  Eyes:     Conjunctiva/sclera: Conjunctivae normal.  Neck:     Musculoskeletal: Neck supple.  Cardiovascular:     Rate and Rhythm: Regular rhythm. Tachycardia present.     Heart sounds: No murmur.  Pulmonary:     Effort: Pulmonary effort is normal. No respiratory distress.     Breath sounds: Normal breath sounds.  Abdominal:     Palpations: Abdomen is soft. There is mass (pump in RLQ with overlying surgical incision, no errythema or drainage).     Tenderness: There is no abdominal tenderness.  Musculoskeletal: Normal  range of motion.        General: Tenderness present. No deformity.     Right lower leg: No edema.     Left  lower leg: No edema.  Skin:    General: Skin is warm and dry.     Capillary Refill: Capillary refill takes less than 2 seconds.  Neurological:     General: No focal deficit present.     Mental Status: She is alert.      ED Treatments / Results  Labs (all labs ordered are listed, but only abnormal results are displayed) Labs Reviewed  COMPREHENSIVE METABOLIC PANEL - Abnormal; Notable for the following components:      Result Value   Potassium 3.3 (*)    Glucose, Bld 120 (*)    Calcium 7.7 (*)    Total Protein 5.8 (*)    Albumin 3.1 (*)    AST 14 (*)    All other components within normal limits  PROTIME-INR - Abnormal; Notable for the following components:   Prothrombin Time 16.3 (*)    INR 1.3 (*)    All other components within normal limits  URINALYSIS, ROUTINE W REFLEX MICROSCOPIC - Abnormal; Notable for the following components:   Hgb urine dipstick TRACE (*)    Ketones, ur 15 (*)    Leukocytes,Ua LARGE (*)    All other components within normal limits  URINALYSIS, MICROSCOPIC (REFLEX) - Abnormal; Notable for the following components:   Bacteria, UA RARE (*)    All other components within normal limits  CULTURE, BLOOD (ROUTINE X 2)  URINE CULTURE  CULTURE, BLOOD (ROUTINE X 2)  CBC WITH DIFFERENTIAL/PLATELET  LACTIC ACID, PLASMA  LIPASE, BLOOD    EKG EKG Interpretation  Date/Time:  Friday May 07 2018 13:16:02 EDT Ventricular Rate:  109 PR Interval:    QRS Duration: 76 QT Interval:  351 QTC Calculation: 473 R Axis:   86 Text Interpretation:  Sinus tachycardia Borderline right axis deviation Nonspecific T abnormalities, lateral leads similar to prior 3/20 Confirmed by Aletta Edouard [phone]) on 05/07/2018 1:28:26 PM Also confirmed by Aletta Edouard [phone]), editor Philomena Doheny [phone])  on 05/07/2018 1:32:29 PM   Radiology Ct Abdomen Pelvis W  Contrast  Result Date: 05/07/2018 CLINICAL DATA:  Diarrhea, abdominal pain. Recent history of colitis. EXAM: CT ABDOMEN AND PELVIS WITH CONTRAST TECHNIQUE: Multidetector CT imaging of the abdomen and pelvis was performed using the standard protocol following bolus administration of intravenous contrast. CONTRAST:  57mL OMNIPAQUE IOHEXOL 300 MG/ML  SOLN COMPARISON:  04/06/2018 no acute abnormality FINDINGS: Lower chest: Pneumobilia as noted on prior study. Prior cholecystectomy. No suspicious focal hepatic abnormality. Hepatobiliary: No focal abnormality or ductal dilatation. Pancreas: No focal abnormality or ductal dilatation. Spleen: No focal abnormality.  Normal size. Adrenals/Urinary Tract: No adrenal abnormality. No focal renal abnormality. No stones or hydronephrosis. Urinary bladder is unremarkable. Stomach/Bowel: Sigmoid diverticulosis. No active diverticulitis. No evidence of bowel wall thickening or obstruction. Vascular/Lymphatic: Aortic atherosclerosis. No enlarged abdominal or pelvic lymph nodes. Reproductive: Prior hysterectomy.  No adnexal masses. Other: No free fluid or free air. Musculoskeletal: No acute bony abnormality. Severe rightward scoliosis in the lumbar spine and associated degenerative changes. IMPRESSION: Previously seen descending colonic colitis no longer visualized. No current bowel wall thickening. Stable pneumobilia, presumably related to previous sphincterotomy. Aortic atherosclerosis. Prior cholecystectomy and hysterectomy. Electronically Signed   By: Rolm Baptise M.D.   On: 05/07/2018 15:43   Dg Chest Port 1 View  Result Date: 05/07/2018 CLINICAL DATA:  Fever EXAM: PORTABLE CHEST 1 VIEW COMPARISON:  04/06/2018 FINDINGS: Spinal stimulator wires in place. Severe thoracolumbar scoliosis. Lungs clear. Heart is normal size. No acute bony abnormality. IMPRESSION: No  active disease. Electronically Signed   By: Rolm Baptise M.D.   On: 05/07/2018 13:52    Procedures Procedures  (including critical care time)  Medications Ordered in ED Medications  sodium chloride flush (NS) 0.9 % injection 3 mL (3 mLs Intravenous Not Given 05/07/18 1329)  sodium chloride 0.9 % bolus 1,000 mL ( Intravenous Rate/Dose Change 05/07/18 1325)    And  sodium chloride 0.9 % bolus 250 mL (has no administration in time range)  potassium chloride 10 mEq in 100 mL IVPB (10 mEq Intravenous New Bag/Given 05/07/18 1411)     Initial Impression / Assessment and Plan / ED Course  I have reviewed the triage vital signs and the nursing notes.  Pertinent labs & imaging results that were available during my care of the patient were reviewed by me and considered in my medical decision making (see chart for details).  Clinical Course as of May 06 1432  Fri May 07, 2018  1314 Differential diagnosis includes pneumonia, Covid, obstruction, perforation, gastroenteritis, dehydration, infectious colitis, ischemic colitis, UTI/sepsis   [MB]  27 Patient's lab work so far is fairly unremarkable.  Normal white count normal lactic acid.  Her potassium is slightly low at 3.3 and I have ordered IV repletion.  Still try to get a urine.  Fluid bolus infusing.  I have put her in for a CT abdomen and pelvis.   [MB]    Clinical Course User Index [MB] Hayden Rasmussen, MD        Final Clinical Impressions(s) / ED Diagnoses   Final diagnoses:  Weakness  Hypokalemia  Abdominal pain, unspecified abdominal location  History of colitis    ED Discharge Orders         Ordered    metoprolol succinate (TOPROL-XL) 25 MG 24 hr tablet  2 times daily     05/07/18 1756    ciprofloxacin (CIPRO) 500 MG tablet  2 times daily     05/07/18 1756           Hayden Rasmussen, MD 05/08/18 1222

## 2018-05-07 NOTE — ED Triage Notes (Signed)
PResents post hospitalization, with c/o fever, abdominal pain and weakness.

## 2018-05-07 NOTE — ED Notes (Signed)
EDP informed of vitals.

## 2018-05-07 NOTE — ED Notes (Signed)
Patient transported to CT 

## 2018-05-07 NOTE — ED Notes (Signed)
Attempted second IV stick- unsuccessful.

## 2018-05-07 NOTE — ED Notes (Signed)
C/o abd pain w nausea and dry heaves, and low grade fever

## 2018-05-08 ENCOUNTER — Telehealth: Payer: Self-pay | Admitting: Family

## 2018-05-08 NOTE — Telephone Encounter (Signed)
Please call pt's daughter and arrange a follow up visit with me this Friday.

## 2018-05-10 ENCOUNTER — Telehealth: Payer: Self-pay

## 2018-05-10 LAB — URINE CULTURE: Culture: >=100000 — AB

## 2018-05-10 NOTE — Telephone Encounter (Signed)
Patient has been schedule for follow up on 05-12-18.  Daughter wanted Provider to review patient's results from er visit this weekend, and give her a call about results.

## 2018-05-11 ENCOUNTER — Telehealth: Payer: Self-pay | Admitting: Emergency Medicine

## 2018-05-11 NOTE — Telephone Encounter (Signed)
Called daughter- no answer. Left message to call the office if pt has had any change in condition, otherwise will plan to address questions at tomorrow's appointment.

## 2018-05-11 NOTE — Telephone Encounter (Signed)
Post ED Visit - Positive Culture Follow-up: Successful Patient Follow-Up  Culture assessed and recommendations reviewed by:  []  Elenor Quinones, Pharm.D. []  Heide Guile, Pharm.D., BCPS AQ-ID []  Parks Neptune, Pharm.D., BCPS []  Alycia Rossetti, Pharm.D., BCPS []  [place], Pharm.D., BCPS, AAHIVP []  Legrand Como, Pharm.D., BCPS, AAHIVP []  Salome Arnt, PharmD, BCPS []  Johnnette Gourd, PharmD, BCPS []  Hughes Better, PharmD, BCPS []  Leeroy Cha, PharmD Pricilla Riffle PharmD  Positive urine culture  []  Patient discharged without antimicrobial prescription and treatment is now indicated [x]  Organism is resistant to prescribed ED discharge antimicrobial []  Patient with positive blood cultures  Changes discussed with ED provider: Lenn Sink PA New antibiotic prescription Stop Ciprofloxacin, start Amoxicillin 500 mg q 12 hours x 5 days Called to [facility] Contacted daughter    Hazle Nordmann 05/11/2018, 10:03 AM

## 2018-05-12 ENCOUNTER — Ambulatory Visit (INDEPENDENT_AMBULATORY_CARE_PROVIDER_SITE_OTHER): Payer: Medicare Other | Admitting: Family

## 2018-05-12 DIAGNOSIS — R197 Diarrhea, unspecified: Secondary | ICD-10-CM | POA: Diagnosis not present

## 2018-05-12 DIAGNOSIS — G8929 Other chronic pain: Secondary | ICD-10-CM | POA: Diagnosis not present

## 2018-05-12 DIAGNOSIS — G894 Chronic pain syndrome: Secondary | ICD-10-CM | POA: Diagnosis not present

## 2018-05-12 DIAGNOSIS — M545 Low back pain: Secondary | ICD-10-CM | POA: Diagnosis not present

## 2018-05-12 DIAGNOSIS — Z978 Presence of other specified devices: Secondary | ICD-10-CM | POA: Diagnosis not present

## 2018-05-12 DIAGNOSIS — R5383 Other fatigue: Secondary | ICD-10-CM | POA: Diagnosis not present

## 2018-05-12 LAB — CULTURE, BLOOD (ROUTINE X 2)
Culture: NO GROWTH
Special Requests: ADEQUATE

## 2018-05-12 NOTE — Progress Notes (Signed)
Virtual Visit via Video Note  I attempted to connect with [name] [name] at  3:40 PM EDT by a video enabled telemedicine application. Due to technical difficulties we were unable to establish a video session and continued with voice only.  I verified that I am speaking with the correct person using two identifiers. This visit type was conducted due to national recommendations for restrictions regarding the COVID-19 Pandemic (e.g. social distancing).  This format is felt to be most appropriate for this patient at this time.   I discussed the limitations of evaluation and management by telemedicine and the availability of in person appointments. The patient expressed understanding and agreed to proceed.  The patient, her granddaughter and myself were on today's video visit. The patient was at home and I was in my office at the time of today's visit.   History of Present Illness:  Patient is a 86 yr old female who presents today for ED follow up. ED record is reviewed. She was evaluated on 05/07/18 with fever.  She also reported intermittent abdominal pain and diarrhea.  She had a recent change of the battery in her pain pump. Lab work showed potassium 3.3, INR 1.3, large leuks/trace blood in urine, EKG noted NSR.  CT abdomen noted resolution of previously seen descending colonic colitis. CXR was negative.   Daughter reports that she had diarrhea and they started her back on flagyl.  She worsened. Her GI Dr. Amada Kingfisher had her collect a stool specimen.  Family gave her fluids. Reports she was not improved and was "in a daze."  She took her to the ER.  Potassium was repleted IV during her visit.  She was noted to have elevated BP >916 systolic while in the ED as well.   Review of culture data shows that urine grew >100 K of enterococcus avium and faecalis.  Avium was resistant to fluoroquinolones.   Of note, the ED contacted the patient to stop cipro and start amoxicillin 500mg  bid x 5 days. This medication was  reportedly called into [facility] by the ED. Daughter reports that she started amoxicillin on Monday. Daughter reports that she has been inactive. She is starting   No pain in abdomen. Had mucousy loose stool today.   Granddaughter reports bp 114/74 HR 102.  98.7    Observations/Objective:  Gen: Awake, alert, no acute distress, weak sounding on phone Resp: Breathing sounds even and non-labored Psych: calm/pleasant demeanor Neuro: Alert and Oriented x 2 (year "1920"), speech is clear. Abd:  Per granddaughter- not tenderness upon palpation of abdomen  Assessment and Plan:  Diarrhea- will re-order stool for c diff.  Granddaughter will come to the office to pick up specimen cup.  Enterococcus UTI- advised granddaughter and patient to continue amoxicillin. She still has some generalized weakness but vitals look OK. I have advised granddaughter to keep a close eye on patient the next few days and to bring her to the ER if increased confusion, abdominal pain, worsening PO intake, increased diarrhea or fever. She verbalizes understanding.  Follow Up Instructions:  22 minutes spent non-face to face today with patient.    I discussed the assessment and treatment plan with the patient. The patient was provided an opportunity to ask questions and all were answered. The patient agreed with the plan and demonstrated an understanding of the instructions.   The patient was advised to call back or seek an in-person evaluation if the symptoms worsen or if the condition fails to improve as anticipated.  Nance Pear, NP

## 2018-05-13 ENCOUNTER — Other Ambulatory Visit: Payer: Self-pay | Admitting: Family

## 2018-05-13 ENCOUNTER — Telehealth: Payer: Self-pay

## 2018-05-13 NOTE — Telephone Encounter (Signed)
Patients daughter came in to pick up specemin containers for urine and BM. Given instructions per lab on how to return. Daughter expressed understanding.

## 2018-05-14 ENCOUNTER — Ambulatory Visit (INDEPENDENT_AMBULATORY_CARE_PROVIDER_SITE_OTHER): Payer: Medicare Other | Admitting: Family

## 2018-05-14 DIAGNOSIS — E860 Dehydration: Secondary | ICD-10-CM | POA: Diagnosis not present

## 2018-05-14 NOTE — Progress Notes (Signed)
Virtual Visit via Video Note  I connected with [name] [name] on 05/14/18 at  1:40 PM EDT by a video enabled telemedicine application and verified that I am speaking with the correct person using two identifiers. This visit type was conducted due to national recommendations for restrictions regarding the COVID-19 Pandemic (e.g. social distancing).  This format is felt to be most appropriate for this patient at this time.   I discussed the limitations of evaluation and management by telemedicine and the availability of in person appointments. The patient expressed understanding and agreed to proceed.  Only the patient, myself and the patient's daughter were on today's video visit. The patient was at home and I was at home at the time of today's visit.   History of Present Illness:  Patient is a 85 yr old female who presents today for a virtual visit. We saw her on 4/15 for a virtual visit as well.  At that time she was continuing to have diarrhea and we ordered a stool for C. Diff.  We also recommended that she continue amoxicillin for cephalosporin resistant enterococcus.  We advised that the patient call if symptoms worsen or if they fail to improve and go to the ED if severe.    Patient continues to feel nauseated.  Only drinking about 4 oz a day.  Barely eating.  Last bp was 2 days ago which was loose. No fever in several days.  Feeling week.  Yesterday she only had 300 cc of urinary output.    Observations/Objective:  Gen: Awake, slightly alert, pale, thin/weak appearing Resp: Breathing is even and non-labored, wearing oxygen via nasal cannula  Psych: calm demeanor Neuro:  + facial symmetry, speech is clear.   Assessment and Plan:  Dehydration- spoke with daughter.  Patient was only participated slightly in the conversation with yes/no answers.  I advised the daughter that I believe that she is dehydrated and needs further evaluation in the ER.  Daughter states patient is refusing to go  to the hospital.  We discussed that if she is refusing care that we should consider goals of care and referral to home hospice.  She would like to discuss this with the patient and her family and asks that I call her back this afternoon to hear what they have decided.   Follow Up Instructions:     I discussed the assessment and treatment plan with the patient. The patient was provided an opportunity to ask questions and all were answered. The patient agreed with the plan and demonstrated an understanding of the instructions.   The patient was advised to call back or seek an in-person evaluation if the symptoms worsen or if the condition fails to improve as anticipated.    Nance Pear, NP   Addendum: Spoke to daughter [name] [name] and Granddaughter who is RN at 4:30 PM. She states that the family gave the patient a liter of fluid yesterday (cousin is a Psychologist, clinical and pt was given NS).    Reports vitals as follow:  bp 150/82 hr 92 sat 99 resp 18 diminished breath sounds throughout, lungs clear. Temp 99.2  She states pt is adamant that she won't go to the hospital. Discussed hospice but the family does not seem ready for this discussion at this time. [place] daughter plans to watch the patient and "[facility] if she gets worse." I told her I am very limited in knowing how to help her since I don't have  access to labs or chest x-ray currently.  She reports that she will call me Monday to let me know how she is doing and will continue amoxicillin over the weekend.

## 2018-05-17 ENCOUNTER — Telehealth: Payer: Self-pay | Admitting: Family

## 2018-05-17 NOTE — Telephone Encounter (Signed)
Spoke to daughter [name] to check on her Mom. She notes that her mother is improving and has started eating more. Still weak but gaining strength. Advised her to let me know if she does not continue to improve and she verbalizes understanding.

## 2018-06-16 DIAGNOSIS — R1013 Epigastric pain: Secondary | ICD-10-CM | POA: Diagnosis not present

## 2018-06-16 DIAGNOSIS — R634 Abnormal weight loss: Secondary | ICD-10-CM | POA: Diagnosis not present

## 2018-06-16 DIAGNOSIS — R630 Anorexia: Secondary | ICD-10-CM | POA: Diagnosis not present

## 2018-07-13 DIAGNOSIS — R51 Headache: Secondary | ICD-10-CM | POA: Diagnosis not present

## 2018-07-13 DIAGNOSIS — M545 Low back pain: Secondary | ICD-10-CM | POA: Diagnosis not present

## 2018-07-13 DIAGNOSIS — Z978 Presence of other specified devices: Secondary | ICD-10-CM | POA: Diagnosis not present

## 2018-07-13 DIAGNOSIS — G894 Chronic pain syndrome: Secondary | ICD-10-CM | POA: Diagnosis not present

## 2018-07-20 ENCOUNTER — Other Ambulatory Visit: Payer: Self-pay

## 2018-07-20 ENCOUNTER — Ambulatory Visit (INDEPENDENT_AMBULATORY_CARE_PROVIDER_SITE_OTHER): Payer: Medicare Other

## 2018-07-20 DIAGNOSIS — M810 Age-related osteoporosis without current pathological fracture: Secondary | ICD-10-CM

## 2018-07-20 MED ORDER — DENOSUMAB 60 MG/ML ~~LOC~~ SOSY
60.0000 mg | PREFILLED_SYRINGE | Freq: Once | SUBCUTANEOUS | Status: AC
  Administered 2018-07-20: 60 mg via SUBCUTANEOUS

## 2018-07-20 NOTE — Progress Notes (Signed)
Reviewed. ° ° °[name] Herne S O'Sullivan NP °

## 2018-07-20 NOTE — Progress Notes (Signed)
Pre visit review using our clinic review tool, if applicable. No additional management support is needed unless otherwise documented below in the visit note.  Patient here today for prolia injection in left upper arm SQ. Patient tolerated well.

## 2018-08-11 NOTE — Telephone Encounter (Signed)
No notes required.

## 2018-09-07 DIAGNOSIS — M545 Low back pain: Secondary | ICD-10-CM | POA: Diagnosis not present

## 2018-09-07 DIAGNOSIS — R51 Headache: Secondary | ICD-10-CM | POA: Diagnosis not present

## 2018-09-07 DIAGNOSIS — Z79899 Other long term (current) drug therapy: Secondary | ICD-10-CM | POA: Diagnosis not present

## 2018-09-07 DIAGNOSIS — G894 Chronic pain syndrome: Secondary | ICD-10-CM | POA: Diagnosis not present

## 2018-10-25 DIAGNOSIS — M545 Low back pain: Secondary | ICD-10-CM | POA: Diagnosis not present

## 2018-10-25 DIAGNOSIS — G894 Chronic pain syndrome: Secondary | ICD-10-CM | POA: Diagnosis not present

## 2018-10-25 DIAGNOSIS — R51 Headache: Secondary | ICD-10-CM | POA: Diagnosis not present

## 2018-10-25 DIAGNOSIS — Z79899 Other long term (current) drug therapy: Secondary | ICD-10-CM | POA: Diagnosis not present

## 2018-10-25 DIAGNOSIS — Z978 Presence of other specified devices: Secondary | ICD-10-CM | POA: Diagnosis not present

## 2019-01-31 DIAGNOSIS — M545 Low back pain: Secondary | ICD-10-CM | POA: Diagnosis not present

## 2019-01-31 DIAGNOSIS — Z79899 Other long term (current) drug therapy: Secondary | ICD-10-CM | POA: Diagnosis not present

## 2019-01-31 DIAGNOSIS — G8929 Other chronic pain: Secondary | ICD-10-CM | POA: Diagnosis not present

## 2019-01-31 DIAGNOSIS — R519 Headache, unspecified: Secondary | ICD-10-CM | POA: Diagnosis not present

## 2019-01-31 DIAGNOSIS — G894 Chronic pain syndrome: Secondary | ICD-10-CM | POA: Diagnosis not present

## 2019-01-31 DIAGNOSIS — Z5181 Encounter for therapeutic drug level monitoring: Secondary | ICD-10-CM | POA: Diagnosis not present

## 2019-01-31 DIAGNOSIS — Z978 Presence of other specified devices: Secondary | ICD-10-CM | POA: Diagnosis not present

## 2019-02-03 ENCOUNTER — Telehealth: Payer: Self-pay | Admitting: *Deleted

## 2019-02-03 NOTE — Telephone Encounter (Signed)
Copied from [place] [id number]. Topic: General - Other >> Feb 03, 2019  1:31 PM Celene Kras wrote: Reason for CRM: Pts daughter called and is concerned about getting pt the covid vaccine due to the reaction pt has to flu shot. She is requesting to have a call back. Please advise.

## 2019-02-03 NOTE — Telephone Encounter (Signed)
It looks like she had hx of HA and stomach pain with flu shot.  She has tolerated, other vaccines without difficulty. I think benefit outweighs risk and she should obtain the covid vaccine.

## 2019-02-04 NOTE — Telephone Encounter (Signed)
Patient advised of provider's advise and she will call for appointment to get injection

## 2019-03-21 ENCOUNTER — Ambulatory Visit: Payer: Medicare Other | Admitting: Adult Health

## 2019-03-31 ENCOUNTER — Other Ambulatory Visit: Payer: Self-pay

## 2019-03-31 ENCOUNTER — Encounter: Payer: Self-pay | Admitting: Adult Health

## 2019-03-31 ENCOUNTER — Ambulatory Visit (INDEPENDENT_AMBULATORY_CARE_PROVIDER_SITE_OTHER): Payer: Medicare Other | Admitting: Adult Health

## 2019-03-31 DIAGNOSIS — J439 Emphysema, unspecified: Secondary | ICD-10-CM | POA: Diagnosis not present

## 2019-03-31 DIAGNOSIS — J9611 Chronic respiratory failure with hypoxia: Secondary | ICD-10-CM

## 2019-03-31 NOTE — Assessment & Plan Note (Signed)
Continue on oxygen to 2 L at bedtime Overnight oximetry test on room air per DME and insurance requirements

## 2019-03-31 NOTE — Progress Notes (Signed)
@Patient  ID: [name] [name], female    DOB: 1925-10-24, 86 y.o.   MRN: [id number]  Chief Complaint  Patient presents with  . Follow-up    COPD     Referring provider: Debbrah Alar, NP  HPI: 86 year old female former smoker followed for moderate stage II COPD with emphysema and chronic respiratory failure on nocturnal oxygen Medical history significant for congestive heart failure  TEST/EVENTS :  Spirometry 2011 showed FEV1 74%, ratio 69, ++BD response with  Post BD FEV1 90%, ratio 74.  Echo 06/2015 EF was recovered to 60%   03/31/2019 Follow up : COPD , O2 RF  Patient presents for a follow-up for COPD.  She was last seen November 2018. Patient says overall breathing is doing okay.  She denies flare of cough or wheezing. She is on Symbicort, uses on occasion, 2-3 days a week.  No increased albuterol use. She remains on oxygen 2 L at bedtime. Insurance and DME are requiring a Psychologist, counselling . Covid vaccine. She has had a couple of hospitalizations,  one last year for enteritis.  Says that she is improved.  Allergies  Allergen Reactions  . Cefdinir Diarrhea  . Codeine Diarrhea and Nausea And Vomiting    REACTION: n/v/d, HA  . Influenza Vaccines     Stomach pain, headache  . Morphine Diarrhea and Nausea And Vomiting    REACTION: n/v/d, HA  . Sulfa Antibiotics Nausea Only    Sick    . Zoledronic Acid Swelling    REACTION: Severe edema    Immunization History  Administered Date(s) Administered  . Influenza Split 12/05/2010, 10/31/2011  . Influenza Whole 01/16/2009, 11/01/2009  . Influenza, High Dose Seasonal PF 11/08/2014, 11/17/2017  . Influenza,inj,Quad PF,6+ Mos 11/01/2012, 10/31/2013, 10/09/2015  . Influenza,inj,Quad PF,6-35 Mos 11/14/2016  . Pneumococcal Conjugate-13 12/28/2012  . Pneumococcal Polysaccharide-23 02/13/2004, 12/05/2016  . Td 06/29/2015  . Zoster 06/23/2013    Past Medical History:  Diagnosis Date  . Abdominal pain    Resolved  .  Cardiac arrhythmia   . CHF (congestive heart failure) ([place])    Diastolic  on ECHO 0000000  . Choledocholithiasis   . Chronic anemia   . Chronic back pain   . COPD (chronic obstructive pulmonary disease) ([facility])    oxygen dependent at  night 2LPM  . Daily headache   . GERD (gastroesophageal reflux disease)   . HCAP (healthcare-associated pneumonia) 03/28/2014  . Hiatal hernia   . Hyperlipidemia   . Hypertension   . On home oxygen therapy    "1L during the day; 2L q hs" (03/28/2014)  . Osteoarthritis    "pretty much q where"   . Osteoporosis   . Pancolitis ([facility])    Infectious vs. inflammatory  . Pancreatitis    History of  . Pneumonia 1930's; 2011 X 2; 02/2014  . PONV (postoperative nausea and vomiting)   . PUD (peptic ulcer disease)   . Reactive airway disease that is not asthma   . Recurrent UTI (urinary tract infection)    "here lately" (03/28/2014)  . Scoliosis deformity of spine    history of intractable back pain    Tobacco History: Social History   Tobacco Use  Smoking Status Former Smoker  . Packs/day: 1.00  . Years: 12.00  . Pack years: 12.00  . Types: Cigarettes  . Quit date: 01/28/1988  . Years since quitting: 31.1  Smokeless Tobacco Never Used   Counseling given: Not Answered   Outpatient Medications Prior to Visit  Medication Sig  Dispense Refill  . acetaminophen (TYLENOL) 500 MG tablet Take 500 mg by mouth 2 (two) times daily as needed for headache.    . albuterol (PROVENTIL HFA;VENTOLIN HFA) 108 (90 Base) MCG/ACT inhaler Inhale 1-2 puffs into the lungs every 6 (six) hours as needed (wheezing & shortness of breath). Reported on 02/28/2015 1 Inhaler 5  . Ascorbic Acid (VITAMIN C) 500 MG tablet Take 500 mg by mouth daily.      . budesonide-formoterol (SYMBICORT) 160-4.5 MCG/ACT inhaler Inhale 2 puffs 2 (two) times daily into the lungs. 2 Inhaler 0  . butalbital-acetaminophen-caffeine (FIORICET, ESGIC) 50-325-40 MG tablet Take 1 tablet by mouth every 4 (four) hours as  needed for headache.     . Calcium Carbonate-Vitamin D (CALCIUM 600-D) 600-400 MG-UNIT per tablet Take 1 tablet by mouth 2 (two) times daily with a meal.      . cholecalciferol (VITAMIN D) 1000 units tablet Take 1,000 Units by mouth daily.    . diclofenac sodium (VOLTAREN) 1 % GEL Place 1 application onto the skin 2 (two) times daily as needed.    . hyoscyamine (LEVSIN SL) 0.125 MG SL tablet Take 0.125 mg by mouth as needed. Abdominal pain    . lactulose (CHRONULAC) 10 GM/15ML solution TAKE 2 TEASPOONFUL (10ML) BY MOUTH AT BEDTIME AS NEEDED FOR CONSTIPATION 236 mL 0  . metoprolol succinate (TOPROL-XL) 25 MG 24 hr tablet Take 1 tablet (25 mg total) by mouth 2 (two) times daily. 40 tablet 0  . Multiple Vitamins-Minerals (MULTIVITAMIN WITH MINERALS) tablet Take 1 tablet by mouth daily. 30 tablet   . nitroGLYCERIN (NITROSTAT) 0.4 MG SL tablet Place 1 tablet (0.4 mg total) under the tongue every 5 (five) minutes as needed for chest pain. 100 tablet 3  . omeprazole (PRILOSEC) 40 MG capsule Take 1 capsule (40 mg total) by mouth 2 (two) times daily. 60 capsule 3  . oxyCODONE-acetaminophen (PERCOCET) 7.5-325 MG tablet Take 1 tablet by mouth 2 (two) times daily as needed for severe pain.    . OXYGEN Inhale 2 L into the lungs as needed.    . Probiotic Product (PROBIOTIC DAILY PO) Take 1 tablet by mouth daily at 12 noon.    . promethazine (PHENERGAN) 25 MG suppository Place 1 suppository (25 mg total) rectally every 6 (six) hours as needed for nausea or vomiting. 12 each 0  . VITAMIN E PO Take 1 tablet by mouth daily.    . ciprofloxacin (CIPRO) 500 MG tablet Take 1 tablet (500 mg total) by mouth 2 (two) times daily. One po bid x 7 days 14 tablet 0  . saccharomyces boulardii (FLORASTOR) 250 MG capsule Take 1 capsule (250 mg total) by mouth 2 (two) times daily. 60 capsule 0   Facility-Administered Medications Prior to Visit  Medication Dose Route Frequency Provider Last Rate Last Admin  . denosumab (PROLIA)  injection 60 mg  60 mg Subcutaneous Once Debbrah Alar, NP         Review of Systems:   Constitutional:   No  weight loss, night sweats,  Fevers, chills, + fatigue, or  lassitude.  HEENT:   No headaches,  Difficulty swallowing,  Tooth/dental problems, or  Sore throat,                No sneezing, itching, ear ache, nasal congestion, post nasal drip,   CV:  No chest pain,  Orthopnea, PND, swelling in lower extremities, anasarca, dizziness, palpitations, syncope.   GI  No heartburn, indigestion, abdominal pain, nausea, vomiting,  diarrhea, change in bowel habits, loss of appetite, bloody stools.   Resp: No shortness of breath with exertion or at rest.  No excess mucus, no productive cough,  No non-productive cough,  No coughing up of blood.  No change in color of mucus.  No wheezing.  No chest wall deformity  Skin: no rash or lesions.  GU: no dysuria, change in color of urine, no urgency or frequency.  No flank pain, no hematuria   MS:  No joint pain or swelling.  No decreased range of motion.  No back pain.    Physical Exam  BP 138/68 (BP Location: Left Arm, Cuff Size: Normal)   Pulse 97   Temp (!) 97 F (36.1 C) (Temporal)   Ht 5' (1.524 m)   Wt 96 lb 6.4 oz (43.7 kg)   SpO2 95% Comment: RA  BMI 18.83 kg/m   GEN: A/Ox3; pleasant , NAD, elderly and frail   HEENT:  [place]/AT,  NOSE-clear, THROAT-clear, no lesions, no postnasal drip or exudate noted.   NECK:  Supple w/ fair ROM; no JVD; normal carotid impulses w/o bruits; no thyromegaly or nodules palpated; no lymphadenopathy.    RESP  Clear  P & A; w/o, wheezes/ rales/ or rhonchi. no accessory muscle use, no dullness to percussion  CARD:  RRR, no m/r/g, no peripheral edema, pulses intact, no cyanosis or clubbing.  GI:   Soft & nt; nml bowel sounds; no organomegaly or masses detected.   Musco: Warm bil, no deformities or joint swelling noted.   Neuro: alert, no focal deficits noted.    Skin: Warm, no lesions or  rashes    Lab Results:   Imaging: No results found.    No flowsheet data found.  No results found for: NITRICOXIDE      Assessment & Plan:   COPD with emphysema, gold stage C. Compensated on present regimen.  Plan  Patient Instructions  Continue on Symbicort 2 puffs Twice daily  Continue on Oxygen 2lm At bedtime  .  Will set up overnight oximetry test on room air for your insurance requirements.   Follow up with Dr. Elsworth Soho  In 1 year and As needed   Please contact office for sooner follow up if symptoms do not improve or worsen or seek emergency care       Chronic respiratory failure ([place]) Continue on oxygen to 2 L at bedtime Overnight oximetry test on room air per DME and insurance requirements     Rexene Edison, NP 03/31/2019

## 2019-03-31 NOTE — Patient Instructions (Signed)
Continue on Symbicort 2 puffs Twice daily  Continue on Oxygen 2lm At bedtime  .  Will set up overnight oximetry test on room air for your insurance requirements.   Follow up with Dr. Elsworth Soho  In 1 year and As needed   Please contact office for sooner follow up if symptoms do not improve or worsen or seek emergency care

## 2019-03-31 NOTE — Assessment & Plan Note (Signed)
Compensated on present regimen.  Plan  Patient Instructions  Continue on Symbicort 2 puffs Twice daily  Continue on Oxygen 2lm At bedtime  .  Will set up overnight oximetry test on room air for your insurance requirements.   Follow up with Dr. Elsworth Soho  In 1 year and As needed   Please contact office for sooner follow up if symptoms do not improve or worsen or seek emergency care

## 2019-04-06 DIAGNOSIS — M4184 Other forms of scoliosis, thoracic region: Secondary | ICD-10-CM | POA: Diagnosis not present

## 2019-04-06 DIAGNOSIS — M8588 Other specified disorders of bone density and structure, other site: Secondary | ICD-10-CM | POA: Diagnosis not present

## 2019-04-06 DIAGNOSIS — M519 Unspecified thoracic, thoracolumbar and lumbosacral intervertebral disc disorder: Secondary | ICD-10-CM | POA: Diagnosis not present

## 2019-04-06 DIAGNOSIS — M4316 Spondylolisthesis, lumbar region: Secondary | ICD-10-CM | POA: Diagnosis not present

## 2019-04-06 DIAGNOSIS — M549 Dorsalgia, unspecified: Secondary | ICD-10-CM | POA: Diagnosis not present

## 2019-04-06 DIAGNOSIS — M545 Low back pain: Secondary | ICD-10-CM | POA: Diagnosis not present

## 2019-04-06 DIAGNOSIS — Z978 Presence of other specified devices: Secondary | ICD-10-CM | POA: Diagnosis not present

## 2019-04-06 DIAGNOSIS — M4186 Other forms of scoliosis, lumbar region: Secondary | ICD-10-CM | POA: Diagnosis not present

## 2019-04-06 DIAGNOSIS — M5136 Other intervertebral disc degeneration, lumbar region: Secondary | ICD-10-CM | POA: Diagnosis not present

## 2019-04-06 DIAGNOSIS — R519 Headache, unspecified: Secondary | ICD-10-CM | POA: Diagnosis not present

## 2019-04-06 DIAGNOSIS — M419 Scoliosis, unspecified: Secondary | ICD-10-CM | POA: Diagnosis not present

## 2019-04-06 DIAGNOSIS — G894 Chronic pain syndrome: Secondary | ICD-10-CM | POA: Diagnosis not present

## 2019-04-06 DIAGNOSIS — M438X4 Other specified deforming dorsopathies, thoracic region: Secondary | ICD-10-CM | POA: Diagnosis not present

## 2019-04-06 DIAGNOSIS — M5134 Other intervertebral disc degeneration, thoracic region: Secondary | ICD-10-CM | POA: Diagnosis not present

## 2019-04-27 ENCOUNTER — Telehealth: Payer: Self-pay

## 2019-04-27 NOTE — Telephone Encounter (Signed)
Patient daughter called in to see if she could talk to the nurse about the patients medication please call the patient daughter back at [phone]

## 2019-05-02 NOTE — Telephone Encounter (Signed)
Talked to patient's daughter [name] at [phone] and I she is trying to find out if patient can come in for her past due prolia shot. She was waiting to have the covid vaccines before coming in. I advsied her I will call her back with the information as soon as I find out from [facility]. About prolia status.

## 2019-05-04 ENCOUNTER — Telehealth: Payer: Self-pay

## 2019-05-04 NOTE — Telephone Encounter (Signed)
Had to resubmit patients information to prolia portal. Waiting on Summary of benefits. Patient should owe 0 same as las time. Ok to schedule for next week or week after.

## 2019-05-09 NOTE — Telephone Encounter (Signed)
[facility] processing prolia oinjection and keeping patient's family updated.

## 2019-05-09 NOTE — Telephone Encounter (Signed)
Patient advised ok to schedule, she will let her daughter know since she is her transportation.

## 2019-05-11 ENCOUNTER — Telehealth: Payer: Self-pay | Admitting: Adult Health

## 2019-05-11 NOTE — Telephone Encounter (Signed)
LMTCB for [name] [name] 

## 2019-05-12 NOTE — Telephone Encounter (Signed)
Spoke with [name] [name]. Advised her that I faxed the pt's last OV note to [facility]. Nothing further was needed.

## 2019-05-19 ENCOUNTER — Ambulatory Visit: Payer: Medicare Other

## 2019-05-26 ENCOUNTER — Ambulatory Visit (INDEPENDENT_AMBULATORY_CARE_PROVIDER_SITE_OTHER): Payer: Medicare Other

## 2019-05-26 ENCOUNTER — Other Ambulatory Visit: Payer: Self-pay

## 2019-05-26 DIAGNOSIS — M810 Age-related osteoporosis without current pathological fracture: Secondary | ICD-10-CM | POA: Diagnosis not present

## 2019-05-26 MED ORDER — DENOSUMAB 60 MG/ML ~~LOC~~ SOSY
60.0000 mg | PREFILLED_SYRINGE | Freq: Once | SUBCUTANEOUS | Status: AC
  Administered 2019-05-26: 60 mg via SUBCUTANEOUS

## 2019-05-26 NOTE — Progress Notes (Signed)
Patient here today for prolia injection in left upper arm SQ. Patient tolerated well.

## 2019-07-16 ENCOUNTER — Encounter: Payer: Self-pay | Admitting: Family

## 2019-07-18 NOTE — Telephone Encounter (Signed)
Called patient's daughter to make appointment and she has to look at the calender before scheduling. She will call back as soon as she gets home.

## 2019-07-18 NOTE — Telephone Encounter (Signed)
Please call daughter and see if she can bring her in to see me tomorrow afternoon please.

## 2019-07-19 ENCOUNTER — Encounter: Payer: Self-pay | Admitting: Family

## 2019-07-23 ENCOUNTER — Other Ambulatory Visit: Payer: Self-pay | Admitting: Family

## 2019-07-27 ENCOUNTER — Ambulatory Visit (INDEPENDENT_AMBULATORY_CARE_PROVIDER_SITE_OTHER): Payer: Medicare Other | Admitting: Family

## 2019-07-27 ENCOUNTER — Other Ambulatory Visit: Payer: Self-pay

## 2019-07-27 VITALS — BP 156/68 | HR 90 | Temp 97.6°F | Resp 16 | Ht 60.0 in | Wt 94.2 lb

## 2019-07-27 DIAGNOSIS — R413 Other amnesia: Secondary | ICD-10-CM | POA: Diagnosis not present

## 2019-07-27 DIAGNOSIS — N182 Chronic kidney disease, stage 2 (mild): Secondary | ICD-10-CM | POA: Diagnosis not present

## 2019-07-27 DIAGNOSIS — E559 Vitamin D deficiency, unspecified: Secondary | ICD-10-CM | POA: Diagnosis not present

## 2019-07-27 DIAGNOSIS — M818 Other osteoporosis without current pathological fracture: Secondary | ICD-10-CM | POA: Diagnosis not present

## 2019-07-27 DIAGNOSIS — J439 Emphysema, unspecified: Secondary | ICD-10-CM

## 2019-07-27 DIAGNOSIS — I10 Essential (primary) hypertension: Secondary | ICD-10-CM

## 2019-07-27 MED ORDER — PROMETHAZINE HCL 25 MG RE SUPP: 25.0000 mg | Freq: Four times a day (QID) | RECTAL | 0 refills | Status: DC | PRN

## 2019-07-27 MED ORDER — METOPROLOL SUCCINATE ER 25 MG PO TB24: 25.0000 mg | ORAL_TABLET | Freq: Two times a day (BID) | ORAL | 3 refills | Status: DC

## 2019-07-27 NOTE — Progress Notes (Signed)
Subjective:    Patient ID: [name] [name], female    DOB: September 04, 1925, 86 y.o.   MRN: [id number]  HPI  Patient is a 86 yr old female who presents today for follow up.  Her daughter is concerned about progressive memory loss. We made a referral to neurology for this concern back in 2018 but it does not appear that she ever followed through with this. Daughter remembers that pt did not want to go. Pt admits to short term memory problems but reports a good long term memory.   MMSE - Mini Mental State Exam 07/04/2016  Orientation to time 4  Orientation to time comments Disoriented to date.  Orientation to Place 5  Registration 3  Attention/ Calculation 1  Attention/Calculation-comments "I can't do that."  Recall 1  Language- name 2 objects 2  Language- repeat 1  Language- follow 3 step command 3  Language- read & follow direction 1  Write a sentence 1  Copy design 1  Total score 23   COPD- maintained on symbicort and albuterol prn.  Reports breathing is "pretty good."   HTN- previously on metoprolol but pt discontinued.  BP Readings from Last 3 Encounters:  07/27/19 (!) 156/68  03/31/19 138/68  05/07/18 (!) 188/93   Vit d deficiency- on vitamin D supplement.   Osteoporosis- last prolia injection 05/26/19.  GERD- denies heartburn.   .     Review of Systems See HPI  Past Medical History:  Diagnosis Date  . Abdominal pain    Resolved  . Cardiac arrhythmia   . CHF (congestive heart failure) ([facility])    Diastolic  on ECHO 5852  . Choledocholithiasis   . Chronic anemia   . Chronic back pain   . COPD (chronic obstructive pulmonary disease) ([facility])    oxygen dependent at  night 2LPM  . Daily headache   . GERD (gastroesophageal reflux disease)   . HCAP (healthcare-associated pneumonia) 03/28/2014  . Hiatal hernia   . Hyperlipidemia   . Hypertension   . On home oxygen therapy    "1L during the day; 2L q hs" (03/28/2014)  . Osteoarthritis    "pretty much q where"   .  Osteoporosis   . Pancolitis ([facility])    Infectious vs. inflammatory  . Pancreatitis    History of  . Pneumonia 1930's; 2011 X 2; 02/2014  . PONV (postoperative nausea and vomiting)   . PUD (peptic ulcer disease)   . Reactive airway disease that is not asthma   . Recurrent UTI (urinary tract infection)    "here lately" (03/28/2014)  . Scoliosis deformity of spine    history of intractable back pain     Social History   Socioeconomic History  . Marital status: Widowed    Spouse name: Not on file  . Number of children: 3  . Years of education: Not on file  . Highest education level: Not on file  Occupational History    Employer: RETIRED  Tobacco Use  . Smoking status: Former Smoker    Packs/day: 1.00    Years: 12.00    Pack years: 12.00    Types: Cigarettes    Quit date: 01/28/1988    Years since quitting: 31.5  . Smokeless tobacco: Never Used  Substance and Sexual Activity  . Alcohol use: No  . Drug use: No  . Sexual activity: Not Currently  Other Topics Concern  . Not on file  Social History Narrative   1 grandchild at care link--Angelia  Lives with great-granddaughter   Social Determinants of Health   Financial Resource Strain:   . Difficulty of Paying Living Expenses:   Food Insecurity:   . Worried About Charity fundraiser in the Last Year:   . Arboriculturist in the Last Year:   Transportation Needs:   . Film/video editor (Medical):   [name] Lack of Transportation (Non-Medical):   Physical Activity:   . Days of Exercise per Week:   . Minutes of Exercise per Session:   Stress:   . Feeling of Stress :   Social Connections:   . Frequency of Communication with Friends and Family:   . Frequency of Social Gatherings with Friends and Family:   . Attends Religious Services:   . Active Member of Clubs or Organizations:   . Attends Archivist Meetings:   [name] Marital Status:   Intimate Partner Violence:   . Fear of Current or Ex-Partner:   . Emotionally  Abused:   [name] Physically Abused:   . Sexually Abused:     Past Surgical History:  Procedure Laterality Date  . ABDOMINAL HYSTERECTOMY  1970s  . BACK SURGERY     x 5  . BREAST CYST EXCISION Right 1960's  . CERVICAL LAMINECTOMY  1970s  . CHOLECYSTECTOMY  2008  . ERCP W/ SPHICTEROTOMY  02/2010  . ESOPHAGOGASTRODUODENOSCOPY N/A 11/14/2013   Procedure: ESOPHAGOGASTRODUODENOSCOPY (EGD);  Surgeon: Missy Sabins, MD;  Location: [facility] ENDOSCOPY;  Service: Endoscopy;  Laterality: N/A;  . FLEXIBLE SIGMOIDOSCOPY N/A 01/24/2013   Procedure: FLEXIBLE SIGMOIDOSCOPY;  Surgeon: Missy Sabins, MD;  Location: [facility];  Service: Endoscopy;  Laterality: N/A;  . PAIN PUMP IMPLANTATION  ~ 2010   back, dilaudid and bupravacaine  . SPINAL CORD STIMULATOR IMPLANT  ~ 2009  . SPINE SURGERY  1970's,1995,2007    Family History  Problem Relation Age of Onset  . Cancer Mother        uterine  . Heart disease Father   . Hypertension Other   . Osteoporosis Neg Hx     Allergies  Allergen Reactions  . Cefdinir Diarrhea  . Codeine Diarrhea and Nausea And Vomiting    REACTION: n/v/d, HA  . Influenza Vaccines     Stomach pain, headache  . Morphine Diarrhea and Nausea And Vomiting    REACTION: n/v/d, HA  . Sulfa Antibiotics Nausea Only    Sick    . Zoledronic Acid Swelling    REACTION: Severe edema    Current Outpatient Medications on File Prior to Visit  Medication Sig Dispense Refill  . acetaminophen (TYLENOL) 500 MG tablet Take 500 mg by mouth 2 (two) times daily as needed for headache.    . albuterol (PROVENTIL HFA;VENTOLIN HFA) 108 (90 Base) MCG/ACT inhaler Inhale 1-2 puffs into the lungs every 6 (six) hours as needed (wheezing & shortness of breath). Reported on 02/28/2015 1 Inhaler 5  . Ascorbic Acid (VITAMIN C) 500 MG tablet Take 500 mg by mouth daily.      . budesonide-formoterol (SYMBICORT) 160-4.5 MCG/ACT inhaler Inhale 2 puffs 2 (two) times daily into the lungs. 2 Inhaler 0  .  butalbital-acetaminophen-caffeine (FIORICET, ESGIC) 50-325-40 MG tablet Take 1 tablet by mouth every 4 (four) hours as needed for headache.     . Calcium Carbonate-Vitamin D (CALCIUM 600-D) 600-400 MG-UNIT per tablet Take 1 tablet by mouth 2 (two) times daily with a meal.      . cholecalciferol (VITAMIN D) 1000 units tablet Take 1,000  Units by mouth daily.    . diclofenac sodium (VOLTAREN) 1 % GEL Place 1 application onto the skin 2 (two) times daily as needed.    . hyoscyamine (LEVSIN SL) 0.125 MG SL tablet Take 0.125 mg by mouth as needed. Abdominal pain    . lactulose (CHRONULAC) 10 GM/15ML solution TAKE 2 TEASPOONFUL (10ML) BY MOUTH AT BEDTIME AS NEEDED FOR CONSTIPATION 236 mL 0  . Multiple Vitamins-Minerals (MULTIVITAMIN WITH MINERALS) tablet Take 1 tablet by mouth daily. 30 tablet   . nitroGLYCERIN (NITROSTAT) 0.4 MG SL tablet Place 1 tablet (0.4 mg total) under the tongue every 5 (five) minutes as needed for chest pain. 100 tablet 3  . omeprazole (PRILOSEC) 40 MG capsule Take 1 capsule (40 mg total) by mouth 2 (two) times daily. 60 capsule 3  . oxyCODONE-acetaminophen (PERCOCET) 7.5-325 MG tablet Take 1 tablet by mouth 2 (two) times daily as needed for severe pain.    . OXYGEN Inhale 2 L into the lungs as needed.    . promethazine (PHENERGAN) 25 MG suppository Place 1 suppository (25 mg total) rectally every 6 (six) hours as needed for nausea or vomiting. 12 each 0  . VITAMIN E PO Take 1 tablet by mouth daily.    . metoprolol succinate (TOPROL-XL) 25 MG 24 hr tablet Take 1 tablet (25 mg total) by mouth 2 (two) times daily. (Patient not taking: Reported on 07/27/2019) 40 tablet 0   Current Facility-Administered Medications on File Prior to Visit  Medication Dose Route Frequency Provider Last Rate Last Admin  . denosumab (PROLIA) injection 60 mg  60 mg Subcutaneous Once Debbrah Alar, NP        BP (!) 156/68 (BP Location: Left Arm, Patient Position: Sitting, Cuff Size: Small)   Pulse  90   Temp 97.6 F (36.4 C) (Temporal)   Resp 16   Ht 5' (1.524 m)   Wt 94 lb 3.2 oz (42.7 kg)   SpO2 97%   BMI 18.40 kg/m       Objective:   Physical Exam Constitutional:      Appearance: She is well-developed.  Neck:     Thyroid: No thyromegaly.  Cardiovascular:     Rate and Rhythm: Normal rate and regular rhythm.     Heart sounds: Normal heart sounds. No murmur heard.   Pulmonary:     Effort: Pulmonary effort is normal. No respiratory distress.     Breath sounds: Normal breath sounds. No wheezing.  Musculoskeletal:     Cervical back: Neck supple.  Skin:    General: Skin is warm and dry.  Neurological:     Mental Status: She is alert and oriented to person, place, and time.  Psychiatric:        Behavior: Behavior normal.        Thought Content: Thought content normal.        Judgment: Judgment normal.           Assessment & Plan:  Memory loss- Patient declines referral to neurology.  HTN- bp elevated, restart metoprolol.  COPD- stable on current regimen, continue same.  Osteoporosis- continue prolia.  Vit D deficiency- check vit D level. Continue supplement.  This visit occurred during the SARS-CoV-2 public health emergency.  Safety protocols were in place, including screening questions prior to the visit, additional usage of staff PPE, and extensive cleaning of exam room while observing appropriate contact time as indicated for disinfecting solutions.

## 2019-07-27 NOTE — Patient Instructions (Signed)
Please complete lab work prior to leaving. Let me know if you change your mind about referral to Neurology. Restart Metoprolol 25mg  once daily.

## 2019-07-28 ENCOUNTER — Encounter: Payer: Self-pay | Admitting: Family

## 2019-07-28 LAB — COMPREHENSIVE METABOLIC PANEL
ALT: 7 U/L (ref 0–35)
AST: 14 U/L (ref 0–37)
Albumin: 4.1 g/dL (ref 3.5–5.2)
Alkaline Phosphatase: 51 U/L (ref 39–117)
BUN: 24 mg/dL — ABNORMAL HIGH (ref 6–23)
CO2: 26 mEq/L (ref 19–32)
Calcium: 8.4 mg/dL (ref 8.4–10.5)
Chloride: 110 mEq/L (ref 96–112)
Creatinine, Ser: 0.84 mg/dL (ref 0.40–1.20)
GFR: 63.38 mL/min (ref >60.00)
Glucose, Bld: 88 mg/dL (ref 70–99)
Potassium: 4.3 mEq/L (ref 3.5–5.1)
Sodium: 144 mEq/L (ref 135–145)
Total Bilirubin: 0.1 mg/dL — ABNORMAL LOW (ref 0.2–1.2)
Total Protein: 6.2 g/dL (ref 6.0–8.3)

## 2019-07-29 ENCOUNTER — Other Ambulatory Visit: Payer: Self-pay

## 2019-07-29 ENCOUNTER — Telehealth: Payer: Self-pay | Admitting: Family

## 2019-07-29 ENCOUNTER — Encounter: Payer: Self-pay | Admitting: Family

## 2019-07-29 MED ORDER — METOPROLOL SUCCINATE ER 25 MG PO TB24: 25.0000 mg | ORAL_TABLET | Freq: Two times a day (BID) | ORAL | 3 refills | Status: DC

## 2019-07-29 NOTE — Telephone Encounter (Signed)
rx sent

## 2019-07-29 NOTE — Telephone Encounter (Signed)
Patient states she would like for you to send metoprolol succinate (TOPROL-XL) 25 MG 24 hr tablet [ To pharmacy [facility], [place] - [address]  [place], [place] [address]  Phone:  [phone] Fax:  [phone]

## 2019-07-31 LAB — VITAMIN D 1,25 DIHYDROXY
Vitamin D 1, 25 (OH)2 Total: 61 pg/mL (ref 18–72)
Vitamin D2 1, 25 (OH)2: 10 pg/mL
Vitamin D3 1, 25 (OH)2: 51 pg/mL

## 2019-08-02 NOTE — Telephone Encounter (Signed)
Unfortunately I do not agree with this but we have no control over it this is the insurance and homecare company requirements.  insurance will not pay for oxygen unless she does this according to the homecare company. She will need to do the overnight oximetry test without oxygen. Let us reach out to the homecare company to verify this but this is what we have been told in the past.

## 2019-08-02 NOTE — Telephone Encounter (Signed)
Tammy please advise. Thanks. 

## 2019-08-02 NOTE — Telephone Encounter (Signed)
Rx was sent 07-27-2019

## 2019-08-08 ENCOUNTER — Encounter: Payer: Self-pay | Admitting: Family

## 2019-08-08 NOTE — Telephone Encounter (Deleted)
Patient's daughter concerned. Patient has gone without her o

## 2019-08-08 NOTE — Telephone Encounter (Signed)
Please see email about patient's incoherent episodes and is there any further recommendations. Looks like [facility] geologist states she needs an ONO.  I asked daughter for clarification what she was asking [place] to do? I think she might think that episode qualifies her for over night oxygen, but I want clarification.   Aaron Edelman please advise if any recommendations.

## 2019-08-08 NOTE — Telephone Encounter (Signed)
08/08/2019  Unfortunately this episode does not qualify from a Medicare guidelines standpoint.  I would still recommend the patient complete an overnight oximetry on room air as previously recommended by TP NP.  Sounds like patient likely needs oxygen at night.  Wyn Quaker, FNP

## 2019-08-29 ENCOUNTER — Telehealth: Payer: Self-pay | Admitting: Pulmonary Disease

## 2019-08-29 ENCOUNTER — Encounter: Payer: Self-pay | Admitting: Adult Health

## 2019-08-29 NOTE — Telephone Encounter (Signed)
Spoke with patient's daughter, [name] [name], she is concerned about her grandmother doing the ONO on RA to qualify her for oxygen.  She wears oxygen at night and when she went to bed without it accidentally one night, she woke up confused, wear (could not get up and get to the bathroom), cursing (which is not normal for her) and was incontinent.  She spoke with Tammy Parrett previously and was told her hands were tied.  She is concerned with her doing the test off oxygen knowing how she responds. She is wondering if there is a letter that can be written for her to keep her oxygen without doing the test.  They have the equipment, but wonder what else they can do to get her qualified.  Dr. Melvyn Novas, please advise.  Thank you.

## 2019-08-29 NOTE — Telephone Encounter (Signed)
Could try on RA and place 02 at any point  If / when starts having any symptoms as we only need to document 5 minutes of desats to get her 02 approved

## 2019-08-29 NOTE — Telephone Encounter (Signed)
Called and spoke with [name] and relayed Dr. Gustavus Bryant information.  She stated that the patient's HR and BP go way up and she is concerned about other problems.  She verbalized understanding.  Nothing further needed.

## 2019-09-23 NOTE — Telephone Encounter (Signed)
The last ono I can find that our office ordered was on 03/31/19 by TP.  If patient had ono in August it must have been ordered by someone else??  Will route message to triage to follow up if ono was ordered.

## 2019-09-23 NOTE — Telephone Encounter (Signed)
mychart message received by pt's daughter in regards to ONO that was performed 8/2. I checked TP's folder and have not seen any results. PCCs, can you please see if you can help [place] out with this. ONO was done by D[facility].

## 2019-09-26 NOTE — Telephone Encounter (Signed)
Tammy, please see mychart message from pt's daughter [name]. Last ONO was performed March 2021 but based off of the recent message from her, it seems like [employer] is stating that insurance is requiring pt to have to have ONO repeated. Please advise.

## 2019-09-26 NOTE — Telephone Encounter (Signed)
We will need to call [employer] 8/31 as their office is now closed.

## 2019-09-26 NOTE — Telephone Encounter (Signed)
Please call dme to see if ONO was done, do we have results   Please contact office for sooner follow up if symptoms do not improve or worsen or seek emergency care

## 2019-09-27 NOTE — Telephone Encounter (Signed)
Community message has been sent to [facility] with [facility] for help figuring this out. Will update once response has been received.

## 2019-10-12 ENCOUNTER — Telehealth: Payer: Self-pay | Admitting: Pulmonary Disease

## 2019-10-12 NOTE — Telephone Encounter (Signed)
Called [name] but there was no answer so LMTCB x 1

## 2019-10-13 NOTE — Telephone Encounter (Signed)
[name] [name] is returning phone call. [name] phone number is [phone].

## 2019-10-13 NOTE — Telephone Encounter (Signed)
[name] [name] is returning phone call. [name] phone number is [phone].

## 2019-10-13 NOTE — Telephone Encounter (Signed)
Spoke with [name]  She states pt needing appt per [employer] to qualify for o2  Appt scheduled  Nothing further needed

## 2019-10-13 NOTE — Telephone Encounter (Signed)
LMTCB again for [name] [name]  Pt will need appt since not seen since 2018

## 2019-11-08 ENCOUNTER — Encounter: Payer: Self-pay | Admitting: Pulmonary Disease

## 2019-11-08 ENCOUNTER — Ambulatory Visit: Payer: Medicare HMO | Admitting: Pulmonary Disease

## 2019-11-08 ENCOUNTER — Other Ambulatory Visit: Payer: Self-pay

## 2019-11-08 ENCOUNTER — Ambulatory Visit (INDEPENDENT_AMBULATORY_CARE_PROVIDER_SITE_OTHER): Payer: Medicare HMO

## 2019-11-08 VITALS — BP 110/62 | HR 86 | Temp 97.3°F | Ht 60.0 in | Wt 89.8 lb

## 2019-11-08 DIAGNOSIS — J439 Emphysema, unspecified: Secondary | ICD-10-CM

## 2019-11-08 DIAGNOSIS — J9611 Chronic respiratory failure with hypoxia: Secondary | ICD-10-CM

## 2019-11-08 NOTE — Patient Instructions (Signed)
CXR today.  

## 2019-11-08 NOTE — Progress Notes (Signed)
   Subjective:    Patient ID: [name] [name], female    DOB: 03/29/1925, 86 y.o.   MRN: [id number]  HPI  86 yo former smoker with Moderate Stage II COPD with emphysema PMH -systolic CHF, memory loss, osteoporosis  Bought her own O2  - uses 2L at bedtime  Last seen by me 05/2016 , arrives with her daughter today. She has been losing weight, has lost 16 pounds from 105 to 89 pounds in the last 3 years.  She denies abdominal pain CT abdomen 04/2018 showed sigmoid diverticulosis. CT angio chest 03/2014 showed consolidation of both bases  Breathing has been mostly stable Chest x-ray was obtained and independently reviewed which does not show any evidence of new infiltrate or effusions  Significant tests/ events reviewed  Spirometry 2011 showed FEV1 74%, ratio 69, ++BD response with  Post BD FEV1 90%, ratio 74.   Echo 06/2015 EF was recovered to 60%  Review of Systems neg for any significant sore throat, dysphagia, itching, sneezing, nasal congestion or excess/ purulent secretions, fever, chills, sweats,  pleuritic or exertional cp, hempoptysis, orthopnea pnd or change in chronic leg swelling. Also denies presyncope, palpitations, heartburn, abdominal pain, nausea, vomiting, diarrhea or change in bowel or urinary habits, dysuria,hematuria, rash, arthralgias, visual complaints, headache, numbness weakness or ataxia.     Objective:   Physical Exam  Gen. Pleasant, elderly, thin in no distress ENT - no thrush, no pallor/icterus,no post nasal drip Neck: No JVD, no thyromegaly, no carotid bruits Lungs: no use of accessory muscles, no dullness to percussion, decreased without rales or rhonchi  Cardiovascular: Rhythm regular, heart sounds  normal, no murmurs or gallops, no peripheral edema Musculoskeletal: No deformities, no cyanosis or clubbing        Assessment & Plan:

## 2019-11-09 ENCOUNTER — Telehealth: Payer: Self-pay | Admitting: Family

## 2019-11-09 NOTE — Telephone Encounter (Signed)
Please contact pt to schedule an appointment with me to further evaluate her recent weight loss.

## 2019-11-09 NOTE — Assessment & Plan Note (Signed)
Is able to use Symbicort although technique is poor. Continue albuterol on a as needed basis Weight loss remains unexplained

## 2019-11-09 NOTE — Telephone Encounter (Signed)
Called but no answer, lvm for patient to call back 

## 2019-11-09 NOTE — Assessment & Plan Note (Signed)
She continues to use oxygen during sleep.  She has bought her own oxygen tank and does not need qualification

## 2019-11-10 ENCOUNTER — Encounter: Payer: Self-pay | Admitting: Pulmonary Disease

## 2019-11-10 ENCOUNTER — Other Ambulatory Visit: Payer: Self-pay | Admitting: Pulmonary Disease

## 2019-11-10 DIAGNOSIS — J84112 Idiopathic pulmonary fibrosis: Secondary | ICD-10-CM

## 2019-11-10 NOTE — Progress Notes (Signed)
ct 

## 2019-11-10 NOTE — Telephone Encounter (Signed)
Called patient and spoke to her daughter. I offered her a few appointments in the next few days including tomorrow. Daughter reports "this is not an emergency and she can not get patient here in the mornings". Patient was scheduled for Friday 29th at her request.

## 2019-11-14 NOTE — Telephone Encounter (Signed)
Dr. Elsworth Soho, please see pt's mychart message and advise.

## 2019-11-16 ENCOUNTER — Ambulatory Visit (HOSPITAL_BASED_OUTPATIENT_CLINIC_OR_DEPARTMENT_OTHER): Payer: Medicare HMO

## 2019-11-23 NOTE — Telephone Encounter (Signed)
Dr. Elsworth Soho, I called and spoke with [name] [name] with [employer], he advised that if we do not do a repeat ONO on RA, they need a RA saturation in the office within 30 days of her 11/08/19 office visit.  I asked if someone from [employer] could make a visit to the home to do a RA saturation or they can arrange for self pay for the oxygen.  [name] [name] stated he would discuss that with his supervisor and would contact the daughter, [name] [name] to advise her as they are not allowed to contact the office directly.  I let him know that I would reach out to the daughter, [name] [name] and let her know the options.  Dr. Elsworth Soho, what are your thoughts?

## 2019-11-23 NOTE — Telephone Encounter (Signed)
Please clarify from [employer] directly what they need OK to sen oxygen Rx as required. Please try to avoid repeat testing for this patient

## 2019-11-23 NOTE — Telephone Encounter (Signed)
Dr. Elsworth Soho, Please see message from [facility] and advise.  Thank you.

## 2019-11-24 NOTE — Telephone Encounter (Signed)
From my standpoint, no further testing required. Please obtain nocturnal oximetry report. I am willing to send order to different DME if that would help

## 2019-11-25 ENCOUNTER — Encounter: Payer: Self-pay | Admitting: Family

## 2019-11-25 ENCOUNTER — Ambulatory Visit (INDEPENDENT_AMBULATORY_CARE_PROVIDER_SITE_OTHER): Payer: Medicare HMO | Admitting: Family

## 2019-11-25 ENCOUNTER — Other Ambulatory Visit: Payer: Self-pay

## 2019-11-25 VITALS — BP 120/69 | HR 92 | Temp 98.6°F | Resp 16 | Ht <= 58 in | Wt 91.0 lb

## 2019-11-25 DIAGNOSIS — R634 Abnormal weight loss: Secondary | ICD-10-CM | POA: Diagnosis not present

## 2019-11-25 DIAGNOSIS — M810 Age-related osteoporosis without current pathological fracture: Secondary | ICD-10-CM

## 2019-11-25 DIAGNOSIS — R079 Chest pain, unspecified: Secondary | ICD-10-CM

## 2019-11-25 DIAGNOSIS — K219 Gastro-esophageal reflux disease without esophagitis: Secondary | ICD-10-CM

## 2019-11-25 DIAGNOSIS — R413 Other amnesia: Secondary | ICD-10-CM

## 2019-11-25 DIAGNOSIS — I10 Essential (primary) hypertension: Secondary | ICD-10-CM

## 2019-11-25 DIAGNOSIS — M818 Other osteoporosis without current pathological fracture: Secondary | ICD-10-CM | POA: Diagnosis not present

## 2019-11-25 DIAGNOSIS — G8929 Other chronic pain: Secondary | ICD-10-CM

## 2019-11-25 DIAGNOSIS — Z23 Encounter for immunization: Secondary | ICD-10-CM | POA: Diagnosis not present

## 2019-11-25 DIAGNOSIS — M545 Low back pain, unspecified: Secondary | ICD-10-CM

## 2019-11-25 MED ORDER — DENOSUMAB 60 MG/ML ~~LOC~~ SOSY
60.0000 mg | PREFILLED_SYRINGE | Freq: Once | SUBCUTANEOUS | Status: AC
  Administered 2019-11-25: 60 mg via SUBCUTANEOUS

## 2019-11-25 MED ORDER — SHINGRIX 50 MCG/0.5ML IM SUSR: INTRAMUSCULAR | 1 refills | Status: DC

## 2019-11-25 MED ORDER — NITROGLYCERIN 0.4 MG SL SUBL: 0.4000 mg | SUBLINGUAL_TABLET | SUBLINGUAL | 0 refills | Status: DC | PRN

## 2019-11-25 MED ORDER — OMEPRAZOLE 40 MG PO CPDR: 40.0000 mg | DELAYED_RELEASE_CAPSULE | Freq: Two times a day (BID) | ORAL | 3 refills | Status: DC

## 2019-11-25 NOTE — Patient Instructions (Addendum)
Please complete lab work prior to leaving.    High-Protein and High-Calorie Diet Eating high-protein and high-calorie foods can help you to gain weight, heal after an injury, and recover after an illness or surgery. The specific amount of daily protein and calories you need depends on:  Your body weight.  The reason this diet is recommended for you. What is my plan? Generally, a high-protein, high-calorie diet involves:  Eating 250-500 extra calories each day.  Making sure that you get enough of your daily calories from protein. Ask your health care provider how many of your calories should come from protein. Talk with a health care provider, such as a diet and nutrition specialist (dietitian), about how much protein and how many calories you need each day. Follow the diet as directed by your health care provider. What are tips for following this plan?  Preparing meals  Add whole milk, half-and-half, or heavy cream to cereal, pudding, soup, or hot cocoa.  Add whole milk to instant breakfast drinks.  Add peanut butter to oatmeal or smoothies.  Add powdered milk to baked goods, smoothies, or milkshakes.  Add powdered milk, cream, or butter to mashed potatoes.  Add cheese to cooked vegetables.  Make whole-milk yogurt parfaits. Top them with granola, fruit, or nuts.  Add cottage cheese to your fruit.  Add avocado, cheese, or both to sandwiches or salads.  Add meat, poultry, or seafood to rice, pasta, casseroles, salads, and soups.  Use mayonnaise when making egg salad, chicken salad, or tuna salad.  Use peanut butter as a dip for vegetables or as a topping for pretzels, celery, or crackers.  Add beans to casseroles, dips, and spreads.  Add pureed beans to sauces and soups.  Replace calorie-free drinks with calorie-containing drinks, such as milk and fruit juice.  Replace water with milk or heavy cream when making foods such as oatmeal, pudding, or cocoa. General  instructions  Ask your health care provider if you should take a nutritional supplement.  Try to eat six small meals each day instead of three large meals.  Eat a balanced diet. In each meal, include one food that is high in protein.  Keep nutritious snacks available, such as nuts, trail mixes, dried fruit, and yogurt.  If you have kidney disease or diabetes, talk with your health care provider about how much protein is safe for you. Too much protein may put extra stress on your kidneys.  Drink your calories. Choose high-calorie drinks and have them after your meals. What high-protein foods should I eat?  Vegetables Soybeans. Peas. Grains Quinoa. Bulgur wheat. Meats and other proteins Beef, pork, and poultry. Fish and seafood. Eggs. Tofu. Textured vegetable protein (TVP). Peanut butter. Nuts and seeds. Dried beans. Protein powders. Dairy Whole milk. Whole-milk yogurt. Powdered milk. Cheese. [employer]. Eggnog. Beverages High-protein supplement drinks. Soy milk. Other foods Protein bars. The items listed above may not be a complete list of high-protein foods and beverages. Contact a dietitian for more options. What high-calorie foods should I eat? Fruits Dried fruit. Fruit leather. Canned fruit in syrup. Fruit juice. Avocado. Vegetables Vegetables cooked in oil or butter. Fried potatoes. Grains Pasta. Quick breads. Muffins. Pancakes. Ready-to-eat cereal. Meats and other proteins Peanut butter. Nuts and seeds. Dairy Heavy cream. Whipped cream. Cream cheese. Sour cream. Ice cream. Custard. Pudding. Beverages Meal-replacement beverages. Nutrition shakes. Fruit juice. Sugar-sweetened soft drinks. Seasonings and condiments Salad dressing. Mayonnaise. Alfredo sauce. Fruit preserves or jelly. Honey. Syrup. Sweets and desserts Cake. Cookies. Pie.  Pastries. Candy bars. Chocolate. Fats and oils Butter or margarine. Oil. Gravy. Other foods Meal-replacement bars. The items  listed above may not be a complete list of high-calorie foods and beverages. Contact a dietitian for more options. Summary  A high-protein, high-calorie diet can help you gain weight or heal faster after an injury, illness, or surgery.  To increase your protein and calories, add ingredients such as whole milk, peanut butter, cheese, beans, meat, or seafood to meal items.  To get enough extra calories each day, include high-calorie foods and beverages at each meal.  Adding a high-calorie drink or shake can be an easy way to help you get enough calories each day. Talk with your healthcare provider or dietitian about the best options for you. This information is not intended to replace advice given to you by your health care provider. Make sure you discuss any questions you have with your health care provider. Document Revised: 12/26/2016 Document Reviewed: 11/25/2016 Elsevier Patient Education  2020 [employer].

## 2019-11-25 NOTE — Telephone Encounter (Signed)
Called [employer] at the phone number provided by pt's daughter [name] and they are going to fax pt's ONO results to office in Dr. Bari Mantis attn. Once this has been received, we will give to Dr. Elsworth Soho to review. Will keep encounter open until results have been taken care of.

## 2019-11-25 NOTE — Progress Notes (Signed)
Subjective:    Patient ID: [name] [name], female    DOB: 01-Jul-1925, 86 y.o.   MRN: [id number]  HPI  Patient is a 86 yr old female who presents today due to weight loss. Patient reports that she eats 3 meals a day.  Daughter states that she eats small portions and is somewhat picky about what she likes to eat.    Wt Readings from Last 3 Encounters:  11/25/19 91 lb (41.3 kg)  11/08/19 89 lb 12.8 oz (40.7 kg)  07/27/19 94 lb 3.2 oz (42.7 kg)   Memory loss- patient and daughter report that this is about the same.    HTN- not currently on antihypertensives.  BP Readings from Last 3 Encounters:  11/25/19 120/69  11/08/19 110/62  07/27/19 (!) 156/68   COPD- maintained on symbicort. Maintained on oxygen 2 L [place]  GERD- maintained on omeprazole 40mg .  Osteoporosis- due for prolia today.    Vit D deficiency- on vit d supplement.   Chronic back pain- on oxycodone.    Review of Systems See HPI  Past Medical History:  Diagnosis Date  . Abdominal pain    Resolved  . Cardiac arrhythmia   . CHF (congestive heart failure) ([place])    Diastolic  on ECHO 9509  . Choledocholithiasis   . Chronic anemia   . Chronic back pain   . COPD (chronic obstructive pulmonary disease) (HCC)    oxygen dependent at  night 2LPM  . Daily headache   . GERD (gastroesophageal reflux disease)   . HCAP (healthcare-associated pneumonia) 03/28/2014  . Hiatal hernia   . Hyperlipidemia   . Hypertension   . On home oxygen therapy    "1L during the day; 2L q hs" (03/28/2014)  . Osteoarthritis    "pretty much q where"   . Osteoporosis   . Pancolitis (HCC)    Infectious vs. inflammatory  . Pancreatitis    History of  . Pneumonia 1930's; 2011 X 2; 02/2014  . PONV (postoperative nausea and vomiting)   . PUD (peptic ulcer disease)   . Reactive airway disease that is not asthma   . Recurrent UTI (urinary tract infection)    "here lately" (03/28/2014)  . Scoliosis deformity of spine    history of intractable  back pain     Social History   Socioeconomic History  . Marital status: Widowed    Spouse name: Not on file  . Number of children: 3  . Years of education: Not on file  . Highest education level: Not on file  Occupational History    Employer: RETIRED  Tobacco Use  . Smoking status: Former Smoker    Packs/day: 1.00    Years: 12.00    Pack years: 12.00    Types: Cigarettes    Quit date: 01/28/1988    Years since quitting: 31.8  . Smokeless tobacco: Never Used  Substance and Sexual Activity  . Alcohol use: No  . Drug use: No  . Sexual activity: Not Currently  Other Topics Concern  . Not on file  Social History Narrative   1 grandchild [facility]--Angelia   Lives with great-granddaughter   Social Determinants of Health   Financial Resource Strain:   . Difficulty of Paying Living Expenses: Not on file  Food Insecurity:   . Worried About Charity fundraiser in the Last Year: Not on file  . Ran Out of Food in the Last Year: Not on file  Transportation Needs:   .  Lack of Transportation (Medical): Not on file  . Lack of Transportation (Non-Medical): Not on file  Physical Activity:   . Days of Exercise per Week: Not on file  . Minutes of Exercise per Session: Not on file  Stress:   . Feeling of Stress : Not on file  Social Connections:   . Frequency of Communication with Friends and Family: Not on file  . Frequency of Social Gatherings with Friends and Family: Not on file  . Attends Religious Services: Not on file  . Active Member of Clubs or Organizations: Not on file  . Attends Archivist Meetings: Not on file  . Marital Status: Not on file  Intimate Partner Violence:   . Fear of Current or Ex-Partner: Not on file  . Emotionally Abused: Not on file  . Physically Abused: Not on file  . Sexually Abused: Not on file    Past Surgical History:  Procedure Laterality Date  . ABDOMINAL HYSTERECTOMY  1970s  . BACK SURGERY     x 5  . BREAST CYST EXCISION  Right 1960's  . CERVICAL LAMINECTOMY  1970s  . CHOLECYSTECTOMY  2008  . ERCP W/ SPHICTEROTOMY  02/2010  . ESOPHAGOGASTRODUODENOSCOPY N/A 11/14/2013   Procedure: ESOPHAGOGASTRODUODENOSCOPY (EGD);  Surgeon: Missy Sabins, MD;  Location: [facility] ENDOSCOPY;  Service: Endoscopy;  Laterality: N/A;  . FLEXIBLE SIGMOIDOSCOPY N/A 01/24/2013   Procedure: FLEXIBLE SIGMOIDOSCOPY;  Surgeon: Missy Sabins, MD;  Location: [facility];  Service: Endoscopy;  Laterality: N/A;  . PAIN PUMP IMPLANTATION  ~ 2010   back, dilaudid and bupravacaine  . SPINAL CORD STIMULATOR IMPLANT  ~ 2009  . SPINE SURGERY  1970's,1995,2007    Family History  Problem Relation Age of Onset  . Cancer Mother        uterine  . Heart disease Father   . Hypertension Other   . Osteoporosis Neg Hx     Allergies  Allergen Reactions  . Cefdinir Diarrhea  . Codeine Diarrhea and Nausea And Vomiting    REACTION: n/v/d, HA  . Influenza Vaccines     Stomach pain, headache  . Morphine Diarrhea and Nausea And Vomiting    REACTION: n/v/d, HA  . Sulfa Antibiotics Nausea Only    Sick    . Zoledronic Acid Swelling    REACTION: Severe edema  . Sulfasalazine Nausea Only    Sick     Current Outpatient Medications on File Prior to Visit  Medication Sig Dispense Refill  . acetaminophen (TYLENOL) 500 MG tablet Take 500 mg by mouth 2 (two) times daily as needed for headache.    . albuterol (PROVENTIL HFA;VENTOLIN HFA) 108 (90 Base) MCG/ACT inhaler Inhale 1-2 puffs into the lungs every 6 (six) hours as needed (wheezing & shortness of breath). Reported on 02/28/2015 1 Inhaler 5  . Ascorbic Acid (VITAMIN C) 500 MG tablet Take 500 mg by mouth daily.      . budesonide-formoterol (SYMBICORT) 160-4.5 MCG/ACT inhaler Inhale 2 puffs 2 (two) times daily into the lungs. 2 Inhaler 0  . Calcium Carbonate-Vitamin D (CALCIUM 600-D) 600-400 MG-UNIT per tablet Take 1 tablet by mouth 2 (two) times daily with a meal.      . cholecalciferol (VITAMIN D) 1000 units  tablet Take 1,000 Units by mouth daily.    . diclofenac sodium (VOLTAREN) 1 % GEL Place 1 application onto the skin 2 (two) times daily as needed.    . hyoscyamine (LEVSIN SL) 0.125 MG SL tablet Take 0.125 mg by  mouth as needed. Abdominal pain    . lactulose (CHRONULAC) 10 GM/15ML solution TAKE 2 TEASPOONFUL (10ML) BY MOUTH AT BEDTIME AS NEEDED FOR CONSTIPATION 236 mL 0  . Multiple Vitamins-Minerals (MULTIVITAMIN WITH MINERALS) tablet Take 1 tablet by mouth daily. 30 tablet   . oxyCODONE-acetaminophen (PERCOCET) 7.5-325 MG tablet Take 1 tablet by mouth 2 (two) times daily as needed for severe pain.    . OXYGEN Inhale 2 L into the lungs as needed.    . promethazine (PHENERGAN) 25 MG suppository Place 1 suppository (25 mg total) rectally every 6 (six) hours as needed for nausea or vomiting. 12 each 0  . VITAMIN E PO Take 1 tablet by mouth daily.     Current Facility-Administered Medications on File Prior to Visit  Medication Dose Route Frequency Provider Last Rate Last Admin  . denosumab (PROLIA) injection 60 mg  60 mg Subcutaneous Once Debbrah Alar, NP        BP 120/69 (BP Location: Right Arm, Patient Position: Sitting, Cuff Size: Small)   Pulse 92   Temp 98.6 F (37 C) (Oral)   Resp 16   Ht 4' 9.5" (1.461 m)   Wt 91 lb (41.3 kg)   SpO2 96%   BMI 19.35 kg/m       Objective:   Physical Exam Constitutional:      General: She is not in acute distress.    Appearance: She is well-developed and underweight. She is not ill-appearing.  Cardiovascular:     Rate and Rhythm: Normal rate and regular rhythm.     Heart sounds: Normal heart sounds. No murmur heard.   Pulmonary:     Effort: Pulmonary effort is normal. No respiratory distress.     Breath sounds: Normal breath sounds. No wheezing.  Psychiatric:        Behavior: Behavior normal.        Thought Content: Thought content normal.        Judgment: Judgment normal.           Assessment & Plan:    Weight loss- she  is actually up a few pounds over the last 2 weeks. We discussed trying to incorporate higher calorie foods into her diet to help support weight gain. Follow up in 6 weeks for weight follow up.  HTN- not currently on antihypertensive- BP stable. Monitor. She has a hx or CHF and has an order for expired nitroglycerin. I renewed this for her and advised daughter to arrange a routine follow up with cardiology as she has not seen them in several years. Pt appears euvolemic.  Memory loss- unchanged, pt has declined referral.  COPD- stable on current regimen. Management per pulmonology.  GERD- stable on omeprazole, continue 40mg  bid- she follows with Dr. Amada Kingfisher.    Osteoporosis- prolia today. Continue caltrate/vit d.    Chronic low back pain- on oxycodone. Management per pain clinic.  This visit occurred during the SARS-CoV-2 public health emergency.  Safety protocols were in place, including screening questions prior to the visit, additional usage of staff PPE, and extensive cleaning of exam room while observing appropriate contact time as indicated for disinfecting solutions.

## 2019-11-26 LAB — COMPREHENSIVE METABOLIC PANEL
AG Ratio: 2 (calc) (ref 1.0–2.5)
ALT: 7 U/L (ref 6–29)
AST: 15 U/L (ref 10–35)
Albumin: 3.8 g/dL (ref 3.6–5.1)
Alkaline phosphatase (APISO): 52 U/L (ref 37–153)
BUN/Creatinine Ratio: 23 (calc) — ABNORMAL HIGH (ref 6–22)
BUN: 23 mg/dL (ref 7–25)
CO2: 25 mmol/L (ref 20–32)
Calcium: 8.5 mg/dL — ABNORMAL LOW (ref 8.6–10.4)
Chloride: 106 mmol/L (ref 98–110)
Creat: 0.99 mg/dL — ABNORMAL HIGH (ref 0.60–0.88)
Globulin: 1.9 g/dL (calc) (ref 1.9–3.7)
Glucose, Bld: 78 mg/dL (ref 65–99)
Potassium: 4.8 mmol/L (ref 3.5–5.3)
Sodium: 143 mmol/L (ref 135–146)
Total Bilirubin: 0.2 mg/dL (ref 0.2–1.2)
Total Protein: 5.7 g/dL — ABNORMAL LOW (ref 6.1–8.1)

## 2019-11-26 LAB — TSH: TSH: 1.51 mIU/L (ref 0.40–4.50)

## 2019-11-30 ENCOUNTER — Telehealth: Payer: Self-pay | Admitting: Pulmonary Disease

## 2019-12-01 NOTE — Telephone Encounter (Signed)
Noted  

## 2019-12-02 ENCOUNTER — Telehealth: Payer: Self-pay | Admitting: Pulmonary Disease

## 2019-12-02 NOTE — Telephone Encounter (Signed)
ONO on RA: from 08/29/2019 This was an old study, patient is currently on 2L [place] at HS.  No call needed as they are already aware.

## 2020-02-08 ENCOUNTER — Other Ambulatory Visit (HOSPITAL_BASED_OUTPATIENT_CLINIC_OR_DEPARTMENT_OTHER): Payer: Medicare HMO

## 2020-02-08 ENCOUNTER — Encounter (HOSPITAL_BASED_OUTPATIENT_CLINIC_OR_DEPARTMENT_OTHER): Payer: Self-pay

## 2020-09-07 ENCOUNTER — Other Ambulatory Visit: Payer: Self-pay

## 2020-09-07 ENCOUNTER — Encounter: Payer: Self-pay | Admitting: Family Medicine

## 2020-09-07 ENCOUNTER — Telehealth (INDEPENDENT_AMBULATORY_CARE_PROVIDER_SITE_OTHER): Payer: Medicare HMO | Admitting: Family Medicine

## 2020-09-07 DIAGNOSIS — J441 Chronic obstructive pulmonary disease with (acute) exacerbation: Secondary | ICD-10-CM | POA: Diagnosis not present

## 2020-09-07 DIAGNOSIS — U071 COVID-19: Secondary | ICD-10-CM | POA: Diagnosis not present

## 2020-09-07 MED ORDER — PREDNISONE 20 MG PO TABS: 40.0000 mg | ORAL_TABLET | Freq: Every day | ORAL | 0 refills | Status: AC

## 2020-09-07 NOTE — Progress Notes (Signed)
Chief Complaint  Patient presents with   Cough    Post Covid cough and crackles    [name] here for URI complaints. Due to COVID-19 pandemic, we are interacting via telephone. I verified patient's ID using 2 identifiers. Patient's granddaughter agreed to proceed with visit via this method. Patient is at home, I am at office. Patient's granddaughter and I are present for visit.   Duration: 9 days  Associated symptoms: sinus congestion, rhinorrhea, sore throat, wheezing, shortness of breath, myalgia, and coughing Denies: sinus pain, itchy watery eyes, ear pain, ear drainage, and fevers, N/V/D, loss of taste/smell Treatment to date: prednisone helped, Advil cold and sinus, Robitussin, Mucinex No weight gain or swelling Sick contacts: Yes; her daughter Tested + for covid on 08/28/20. She got the initial covid vaccine series with Moderna and a single booster. Pt intermittently confused which is her baseline.  She wears 2 L O2 at night, nothing during the day.   Past Medical History:  Diagnosis Date   Abdominal pain    Resolved   Cardiac arrhythmia    CHF (congestive heart failure) (HCC)    Diastolic  on ECHO 0000000   Choledocholithiasis    Chronic anemia    Chronic back pain    COPD (chronic obstructive pulmonary disease) (HCC)    oxygen dependent at  night 2LPM   Daily headache    GERD (gastroesophageal reflux disease)    HCAP (healthcare-associated pneumonia) 03/28/2014   Hiatal hernia    Hyperlipidemia    Hypertension    On home oxygen therapy    "1L during the day; 2L q hs" (03/28/2014)   Osteoarthritis    "pretty much q where"    Osteoporosis    Pancolitis ([place])    Infectious vs. inflammatory   Pancreatitis    History of   Pneumonia 1930's; 2011 X 2; 02/2014   PONV (postoperative nausea and vomiting)    PUD (peptic ulcer disease)    Reactive airway disease that is not asthma    Recurrent UTI (urinary tract infection)    "here lately" (03/28/2014)   Scoliosis deformity  of spine    history of intractable back pain    Objective No conversational dyspnea Age appropriate judgment and insight Nml affect and mood  COVID-19  COPD exacerbation (HCC) - Plan: predniSONE (DELTASONE) 20 MG tablet  5 d pred burst. Granddaughter noted her BP runs low. Will try to hold off on Lasix, pt should return Mon in person if still having issues, would consider fluid status eval and CXR.  Continue to push fluids, practice good hand hygiene, cover mouth when coughing. F/u prn. If starting to experience fevers, irreplaceable fluid loss, shaking, or shortness of breath, seek immediate care. Total time: 11 min Pt's granddaughter voiced understanding and agreement to the plan.  [place], DO 09/07/20 11:33 AM

## 2020-09-27 ENCOUNTER — Emergency Department (HOSPITAL_BASED_OUTPATIENT_CLINIC_OR_DEPARTMENT_OTHER): Payer: Medicare HMO

## 2020-09-27 ENCOUNTER — Encounter (HOSPITAL_BASED_OUTPATIENT_CLINIC_OR_DEPARTMENT_OTHER): Payer: Self-pay

## 2020-09-27 ENCOUNTER — Other Ambulatory Visit: Payer: Self-pay

## 2020-09-27 ENCOUNTER — Inpatient Hospital Stay (HOSPITAL_BASED_OUTPATIENT_CLINIC_OR_DEPARTMENT_OTHER)
Admission: EM | Admit: 2020-09-27 | Discharge: 2020-10-02 | DRG: 193 | Disposition: A | Discharge status: Home Health | Payer: Medicare HMO | Attending: Internal Medicine | Admitting: Internal Medicine

## 2020-09-27 DIAGNOSIS — M549 Dorsalgia, unspecified: Secondary | ICD-10-CM | POA: Diagnosis present

## 2020-09-27 DIAGNOSIS — J189 Pneumonia, unspecified organism: Secondary | ICD-10-CM | POA: Diagnosis not present

## 2020-09-27 DIAGNOSIS — Z87440 Personal history of urinary (tract) infections: Secondary | ICD-10-CM

## 2020-09-27 DIAGNOSIS — M419 Scoliosis, unspecified: Secondary | ICD-10-CM | POA: Diagnosis present

## 2020-09-27 DIAGNOSIS — G9341 Metabolic encephalopathy: Secondary | ICD-10-CM | POA: Diagnosis present

## 2020-09-27 DIAGNOSIS — Z9049 Acquired absence of other specified parts of digestive tract: Secondary | ICD-10-CM

## 2020-09-27 DIAGNOSIS — D689 Coagulation defect, unspecified: Secondary | ICD-10-CM | POA: Diagnosis present

## 2020-09-27 DIAGNOSIS — H1131 Conjunctival hemorrhage, right eye: Secondary | ICD-10-CM | POA: Diagnosis present

## 2020-09-27 DIAGNOSIS — E872 Acidosis, unspecified: Secondary | ICD-10-CM | POA: Diagnosis present

## 2020-09-27 DIAGNOSIS — Z882 Allergy status to sulfonamides status: Secondary | ICD-10-CM

## 2020-09-27 DIAGNOSIS — I509 Heart failure, unspecified: Secondary | ICD-10-CM | POA: Diagnosis present

## 2020-09-27 DIAGNOSIS — B957 Other staphylococcus as the cause of diseases classified elsewhere: Secondary | ICD-10-CM | POA: Diagnosis present

## 2020-09-27 DIAGNOSIS — M810 Age-related osteoporosis without current pathological fracture: Secondary | ICD-10-CM | POA: Diagnosis present

## 2020-09-27 DIAGNOSIS — Z87891 Personal history of nicotine dependence: Secondary | ICD-10-CM

## 2020-09-27 DIAGNOSIS — J188 Other pneumonia, unspecified organism: Secondary | ICD-10-CM | POA: Diagnosis present

## 2020-09-27 DIAGNOSIS — Z681 Body mass index (BMI) 19 or less, adult: Secondary | ICD-10-CM

## 2020-09-27 DIAGNOSIS — Z885 Allergy status to narcotic agent status: Secondary | ICD-10-CM

## 2020-09-27 DIAGNOSIS — E785 Hyperlipidemia, unspecified: Secondary | ICD-10-CM | POA: Diagnosis present

## 2020-09-27 DIAGNOSIS — N179 Acute kidney failure, unspecified: Secondary | ICD-10-CM | POA: Diagnosis present

## 2020-09-27 DIAGNOSIS — E43 Unspecified severe protein-calorie malnutrition: Secondary | ICD-10-CM | POA: Diagnosis present

## 2020-09-27 DIAGNOSIS — R7881 Bacteremia: Secondary | ICD-10-CM | POA: Diagnosis present

## 2020-09-27 DIAGNOSIS — G894 Chronic pain syndrome: Secondary | ICD-10-CM | POA: Diagnosis present

## 2020-09-27 DIAGNOSIS — U071 COVID-19: Secondary | ICD-10-CM | POA: Diagnosis present

## 2020-09-27 DIAGNOSIS — Z9981 Dependence on supplemental oxygen: Secondary | ICD-10-CM

## 2020-09-27 DIAGNOSIS — Z90710 Acquired absence of both cervix and uterus: Secondary | ICD-10-CM

## 2020-09-27 DIAGNOSIS — R443 Hallucinations, unspecified: Secondary | ICD-10-CM | POA: Diagnosis present

## 2020-09-27 DIAGNOSIS — J9621 Acute and chronic respiratory failure with hypoxia: Secondary | ICD-10-CM | POA: Diagnosis present

## 2020-09-27 DIAGNOSIS — I110 Hypertensive heart disease with heart failure: Secondary | ICD-10-CM | POA: Diagnosis present

## 2020-09-27 DIAGNOSIS — F05 Delirium due to known physiological condition: Secondary | ICD-10-CM | POA: Diagnosis present

## 2020-09-27 DIAGNOSIS — K219 Gastro-esophageal reflux disease without esophagitis: Secondary | ICD-10-CM | POA: Diagnosis present

## 2020-09-27 DIAGNOSIS — D72819 Decreased white blood cell count, unspecified: Secondary | ICD-10-CM

## 2020-09-27 DIAGNOSIS — Z8249 Family history of ischemic heart disease and other diseases of the circulatory system: Secondary | ICD-10-CM

## 2020-09-27 DIAGNOSIS — D649 Anemia, unspecified: Secondary | ICD-10-CM | POA: Diagnosis present

## 2020-09-27 DIAGNOSIS — J440 Chronic obstructive pulmonary disease with acute lower respiratory infection: Secondary | ICD-10-CM | POA: Diagnosis present

## 2020-09-27 LAB — CBC WITH DIFFERENTIAL/PLATELET
Abs Immature Granulocytes: 0.06 10*3/uL (ref 0.00–0.07)
Basophils Absolute: 0 10*3/uL (ref 0.0–0.1)
Basophils Relative: 1 %
Eosinophils Absolute: 0 10*3/uL (ref 0.0–0.5)
Eosinophils Relative: 1 %
HCT: 36 % (ref 36.0–46.0)
Hemoglobin: 11.4 g/dL — ABNORMAL LOW (ref 12.0–15.0)
Immature Granulocytes: 1 %
Lymphocytes Relative: 10 %
Lymphs Abs: 0.6 10*3/uL — ABNORMAL LOW (ref 0.7–4.0)
MCH: 28.9 pg (ref 26.0–34.0)
MCHC: 31.7 g/dL (ref 30.0–36.0)
MCV: 91.1 fL (ref 80.0–100.0)
Monocytes Absolute: 0.4 10*3/uL (ref 0.1–1.0)
Monocytes Relative: 6 %
Neutro Abs: 4.9 10*3/uL (ref 1.7–7.7)
Neutrophils Relative %: 81 %
Platelets: 316 10*3/uL (ref 150–400)
RBC: 3.95 MIL/uL (ref 3.87–5.11)
RDW: 14.9 % (ref 11.5–15.5)
WBC: 5.9 10*3/uL (ref 4.0–10.5)
nRBC: 0 % (ref 0.0–0.2)

## 2020-09-27 LAB — COMPREHENSIVE METABOLIC PANEL
ALT: <5 U/L (ref 0–44)
AST: 11 U/L — ABNORMAL LOW (ref 15–41)
Albumin: 3.9 g/dL (ref 3.5–5.0)
Alkaline Phosphatase: 102 U/L (ref 38–126)
Anion gap: 17 — ABNORMAL HIGH (ref 5–15)
BUN: 25 mg/dL — ABNORMAL HIGH (ref 8–23)
CO2: 20 mmol/L — ABNORMAL LOW (ref 22–32)
Calcium: 9.2 mg/dL (ref 8.9–10.3)
Chloride: 106 mmol/L (ref 98–111)
Creatinine, Ser: 1.22 mg/dL — ABNORMAL HIGH (ref 0.44–1.00)
GFR, Estimated: 41 mL/min — ABNORMAL LOW (ref >60)
Glucose, Bld: 112 mg/dL — ABNORMAL HIGH (ref 70–99)
Potassium: 4 mmol/L (ref 3.5–5.1)
Sodium: 143 mmol/L (ref 135–145)
Total Bilirubin: 0.2 mg/dL — ABNORMAL LOW (ref 0.3–1.2)
Total Protein: 6.9 g/dL (ref 6.5–8.1)

## 2020-09-27 LAB — LACTIC ACID, PLASMA: Lactic Acid, Venous: 1 mmol/L (ref 0.5–1.9)

## 2020-09-27 LAB — RESP PANEL BY RT-PCR (FLU A&B, COVID) ARPGX2
Influenza A by PCR: NEGATIVE
Influenza B by PCR: NEGATIVE
SARS Coronavirus 2 by RT PCR: POSITIVE — AB

## 2020-09-27 LAB — APTT: aPTT: 51 seconds — ABNORMAL HIGH (ref 24–36)

## 2020-09-27 LAB — PROTIME-INR
INR: 1.9 — ABNORMAL HIGH (ref 0.8–1.2)
Prothrombin Time: 21.8 seconds — ABNORMAL HIGH (ref 11.4–15.2)

## 2020-09-27 MED ORDER — ALBUTEROL SULFATE HFA 108 (90 BASE) MCG/ACT IN AERS: 2.0000 | INHALATION_SPRAY | RESPIRATORY_TRACT | Status: DC | PRN

## 2020-09-27 MED ORDER — IOHEXOL 350 MG/ML SOLN
100.0000 mL | Freq: Once | INTRAVENOUS | Status: AC | PRN
  Administered 2020-09-27: 50 mL via INTRAVENOUS

## 2020-09-27 MED ORDER — LACTATED RINGERS IV BOLUS (SEPSIS)
250.0000 mL | Freq: Once | INTRAVENOUS | Status: AC
  Administered 2020-09-27: 250 mL via INTRAVENOUS

## 2020-09-27 MED ORDER — LACTATED RINGERS IV BOLUS (SEPSIS)
1000.0000 mL | Freq: Once | INTRAVENOUS | Status: AC
  Administered 2020-09-27: 1000 mL via INTRAVENOUS

## 2020-09-27 MED ORDER — LACTATED RINGERS IV SOLN: INTRAVENOUS | Status: DC

## 2020-09-27 MED ORDER — SODIUM CHLORIDE 0.9 % IV SOLN
2.0000 g | INTRAVENOUS | Status: AC
  Administered 2020-09-27 – 2020-10-01 (×5): 2 g via INTRAVENOUS
  Filled 2020-09-27 (×5): qty 20

## 2020-09-27 MED ORDER — SODIUM CHLORIDE 0.9 % IV SOLN
500.0000 mg | INTRAVENOUS | Status: DC
  Administered 2020-09-28: 500 mg via INTRAVENOUS
  Filled 2020-09-27: qty 500

## 2020-09-27 NOTE — ED Notes (Signed)
Rocephin 2 GMs charted up at 22:02, but was stopped immediately to draw both sets of blood cultures. Infusion restarted at 22:32.

## 2020-09-27 NOTE — ED Triage Notes (Signed)
Per grand daughter, pt c/o SOB and crackles and noticed that she is confused today  - PMX - COPD  - on 2L  [place]  - had several admission due to  PNA.    From Home

## 2020-09-27 NOTE — ED Provider Notes (Signed)
[facility] EMERGENCY DEPT Provider Note   CSN: [id number] Arrival date & time: 09/27/20  2058     History Chief Complaint  Patient presents with   Shortness of Breath    Reshonda Dios is a 87 y.o. female.  87 year old female presents with shortness of breath and increased confusion x1 day.  History of pneumonia and this is same.  Low-grade temperature at home.  Emesis x2.  History of COPD but is not on chronic oxygen.  Had COVID 5 weeks ago.  No reported diarrhea.  Endorses weakness and able to stand.  Patient normally is able to live by herself      Past Medical History:  Diagnosis Date   Abdominal pain    Resolved   Cardiac arrhythmia    CHF (congestive heart failure) ([place])    Diastolic  on ECHO 0000000   Choledocholithiasis    Chronic anemia    Chronic back pain    COPD (chronic obstructive pulmonary disease) (HCC)    oxygen dependent at  night 2LPM   Daily headache    GERD (gastroesophageal reflux disease)    HCAP (healthcare-associated pneumonia) 03/28/2014   Hiatal hernia    Hyperlipidemia    Hypertension    On home oxygen therapy    "1L during the day; 2L q hs" (03/28/2014)   Osteoarthritis    "pretty much q where"    Osteoporosis    Pancolitis ([place])    Infectious vs. inflammatory   Pancreatitis    History of   Pneumonia 1930's; 2011 X 2; 02/2014   PONV (postoperative nausea and vomiting)    PUD (peptic ulcer disease)    Reactive airway disease that is not asthma    Recurrent UTI (urinary tract infection)    "here lately" (03/28/2014)   Scoliosis deformity of spine    history of intractable back pain    Patient Active Problem List   Diagnosis Date Noted   Abdominal distension 04/07/2018   Polycythemia 04/06/2018   Hypomagnesemia 04/06/2018   Hyperlipidemia 04/06/2018   Hyperglycemia 04/06/2018   Protein-calorie malnutrition, severe 02/06/2018   CKD (chronic kidney disease), stage II 02/03/2018   Abdominal pain 02/03/2018   Chronic  respiratory failure ([place]) 05/17/2015   Other allergic rhinitis 04/19/2014   Other pancytopenia ([place])    Headache(784.0) 08/11/2013   Hypocalcemia 02/07/2013   Memory change 09/28/2012   Abdominal pain, chronic, epigastric 09/28/2012   Chronic tension headaches 09/15/2012   DDD (degenerative disc disease), lumbar 05/31/2012   Vitamin D deficiency 08/17/2010   Chronic back pain 06/02/2010   PANCREATIC INSUFFICIENCY 03/19/2010   ANEMIA 12/27/2009   Hearing loss 08/17/2009   Chronic pulmonary heart disease ([place]) 04/12/2009   Osteoporosis 02/12/2009   Essential hypertension 02/01/2009   CARDIAC ARRHYTHMIA 02/01/2009   Chronic diastolic CHF (congestive heart failure) ([place]) 02/01/2009   COPD with emphysema, gold stage C. 02/01/2009   GERD 02/01/2009   Peptic ulcer 02/01/2009   Diaphragmatic hernia 02/01/2009   OSTEOARTHRITIS 02/01/2009    Past Surgical History:  Procedure Laterality Date   ABDOMINAL HYSTERECTOMY  1970s   BACK SURGERY     x 5   BREAST CYST EXCISION Right 1960's   CERVICAL LAMINECTOMY  1970s   CHOLECYSTECTOMY  2008   ERCP W/ SPHICTEROTOMY  02/2010   ESOPHAGOGASTRODUODENOSCOPY N/A 11/14/2013   Procedure: ESOPHAGOGASTRODUODENOSCOPY (EGD);  Surgeon: Missy Sabins, MD;  Location[facility] ENDOSCOPY;  Service: Endoscopy;  Laterality: N/A;   FLEXIBLE SIGMOIDOSCOPY N/A 01/24/2013   Procedure: FLEXIBLE SIGMOIDOSCOPY;  Surgeon: Missy Sabins, MD;  Location: [facility] ENDOSCOPY;  Service: Endoscopy;  Laterality: N/A;   PAIN PUMP IMPLANTATION  ~ 2010   back, dilaudid and bupravacaine   SPINAL CORD STIMULATOR IMPLANT  ~ 2009   SPINE SURGERY  1970's,1995,2007     OB History   No obstetric history on file.     Family History  Problem Relation Age of Onset   Cancer Mother        uterine   Heart disease Father    Hypertension Other    Osteoporosis Neg Hx     Social History   Tobacco Use   Smoking status: Former    Packs/day: 1.00    Years: 12.00    Pack years: 12.00     Types: Cigarettes    Quit date: 01/28/1988    Years since quitting: 32.6   Smokeless tobacco: Never  Substance Use Topics   Alcohol use: No   Drug use: No    Home Medications Prior to Admission medications   Medication Sig Start Date End Date Taking? Authorizing Provider  acetaminophen (TYLENOL) 500 MG tablet Take 500 mg by mouth 2 (two) times daily as needed for headache.    [provider]  albuterol (PROVENTIL HFA;VENTOLIN HFA) 108 (90 Base) MCG/ACT inhaler Inhale 1-2 puffs into the lungs every 6 (six) hours as needed (wheezing & shortness of breath). Reported on 02/28/2015 10/09/15   Debbrah Alar, NP  Ascorbic Acid (VITAMIN C) 500 MG tablet Take 500 mg by mouth daily.      [provider]  budesonide-formoterol (SYMBICORT) 160-4.5 MCG/ACT inhaler Inhale 2 puffs 2 (two) times daily into the lungs. 12/05/16   Parrett, Fonnie Mu, NP  Calcium Carbonate-Vitamin D 600-400 MG-UNIT tablet Take 1 tablet by mouth 2 (two) times daily with a meal.      [provider]  cholecalciferol (VITAMIN D) 1000 units tablet Take 1,000 Units by mouth daily.    [provider]  diclofenac sodium (VOLTAREN) 1 % GEL Place 1 application onto the skin 2 (two) times daily as needed. 06/06/15   [provider]  hyoscyamine (LEVSIN SL) 0.125 MG SL tablet Take 0.125 mg by mouth as needed. Abdominal pain 11/24/11   [provider]  lactulose (CHRONULAC) 10 GM/15ML solution TAKE 2 TEASPOONFUL (10ML) BY MOUTH AT BEDTIME AS NEEDED FOR CONSTIPATION 03/30/18   Debbrah Alar, NP  Multiple Vitamins-Minerals (MULTIVITAMIN WITH MINERALS) tablet Take 1 tablet by mouth daily. 07/01/15   Debbrah Alar, NP  nitroGLYCERIN (NITROSTAT) 0.4 MG SL tablet Place 1 tablet (0.4 mg total) under the tongue every 5 (five) minutes as needed for chest pain. 11/25/19   Debbrah Alar, NP  omeprazole (PRILOSEC) 40 MG capsule Take 1 capsule (40 mg total) by mouth 2 (two) times daily.  11/25/19   Debbrah Alar, NP  oxyCODONE-acetaminophen (PERCOCET) 7.5-325 MG tablet Take 1 tablet by mouth 2 (two) times daily as needed for severe pain.    [provider]  OXYGEN Inhale 2 L into the lungs as needed.    [provider]  promethazine (PHENERGAN) 25 MG suppository Place 1 suppository (25 mg total) rectally every 6 (six) hours as needed for nausea or vomiting. 07/27/19   Debbrah Alar, NP  VITAMIN E PO Take 1 tablet by mouth daily.    [provider]  Zoster Vaccine Adjuvanted [facility]) injection Inject 0.'5mg'$  IM now and again in 2-6 months. 11/25/19   Debbrah Alar, NP    Allergies  Cefdinir, Codeine, Influenza vaccines, Morphine, Sulfa antibiotics, Zoledronic acid, and Sulfasalazine  Review of Systems   Review of Systems  All other systems reviewed and are negative.  Physical Exam Updated Vital Signs BP (!) 160/87 (BP Location: Left Arm)   Pulse (!) 114   Temp 99.1 F (37.3 C) (Rectal)   Resp (!) 22   Ht 1.499 m ('4\' 11"'$ )   Wt 40.8 kg   SpO2 98%   BMI 18.18 kg/m   Physical Exam Vitals and nursing note reviewed.  Constitutional:      General: She is not in acute distress.    Appearance: Normal appearance. She is well-developed. She is not toxic-appearing.  HENT:     Head: Normocephalic and atraumatic.  Eyes:     General: Lids are normal.     Conjunctiva/sclera: Conjunctivae normal.     Pupils: Pupils are equal, round, and reactive to light.  Neck:     Thyroid: No thyroid mass.     Trachea: No tracheal deviation.  Cardiovascular:     Rate and Rhythm: Normal rate and regular rhythm.     Heart sounds: Normal heart sounds. No murmur heard.   No gallop.  Pulmonary:     Effort: Tachypnea and accessory muscle usage present. No respiratory distress.     Breath sounds: No stridor. Examination of the right-lower field reveals decreased breath sounds. Examination of the left-lower field reveals decreased breath sounds.  Decreased breath sounds present. No wheezing, rhonchi or rales.  Abdominal:     General: There is no distension.     Palpations: Abdomen is soft.     Tenderness: There is no abdominal tenderness. There is no rebound.  Musculoskeletal:        General: No tenderness. Normal range of motion.     Cervical back: Normal range of motion and neck supple.  Skin:    General: Skin is warm and dry.     Findings: No abrasion or rash.  Neurological:     Mental Status: She is alert. She is disoriented.     GCS: GCS eye subscore is 4. GCS verbal subscore is 5. GCS motor subscore is 6.     Cranial Nerves: Cranial nerves are intact. No cranial nerve deficit.     Sensory: No sensory deficit.     Motor: Motor function is intact.  Psychiatric:        Attention and Perception: Attention normal.        Speech: Speech normal.        Behavior: Behavior normal.    ED Results / Procedures / Treatments   Labs (all labs ordered are listed, but only abnormal results are displayed) Labs Reviewed  CBC WITH DIFFERENTIAL/PLATELET - Abnormal; Notable for the following components:      Result Value   Hemoglobin 11.4 (*)    Lymphs Abs 0.6 (*)    All other components within normal limits  RESP PANEL BY RT-PCR (FLU A&B, COVID) ARPGX2  CULTURE, BLOOD (ROUTINE X 2)  CULTURE, BLOOD (ROUTINE X 2)  URINE CULTURE  LACTIC ACID, PLASMA  LACTIC ACID, PLASMA  COMPREHENSIVE METABOLIC PANEL  PROTIME-INR  APTT  URINALYSIS, ROUTINE W REFLEX MICROSCOPIC    EKG None  Radiology DG Chest Portable 1 View  Result Date: 09/27/2020 CLINICAL DATA:  Shortness of breath EXAM: PORTABLE CHEST 1 VIEW COMPARISON:  11/17/2019 FINDINGS: Stable heart size. Atherosclerotic calcification of the aortic knob. Chronically hyperinflated lungs with coarsened interstitial markings. No focal airspace consolidation, pleural effusion,  or pneumothorax. Scoliotic spinal curvature. Thoracic spinal stimulator leads are present. IMPRESSION: No active  disease. Electronically Signed   By: Davina Poke D.O.   On: 09/27/2020 21:48    Procedures Procedures   Medications Ordered in ED Medications  lactated ringers infusion (has no administration in time range)  lactated ringers bolus 1,000 mL (has no administration in time range)    And  lactated ringers bolus 250 mL (has no administration in time range)  cefTRIAXone (ROCEPHIN) 2 g in sodium chloride 0.9 % 100 mL IVPB (has no administration in time range)  azithromycin (ZITHROMAX) 500 mg in sodium chloride 0.9 % 250 mL IVPB (has no administration in time range)    ED Course  I have reviewed the triage vital signs and the nursing notes.  Pertinent labs & imaging results that were available during my care of the patient were reviewed by me and considered in my medical decision making (see chart for details).    MDM Rules/Calculators/A&P                           Patient has evidence of pneumonia on chest x-ray.  Will be admitted to the hospital service Final Clinical Impression(s) / ED Diagnoses Final diagnoses:  None    Rx / DC Orders ED Discharge Orders     None        Lacretia Leigh, MD 10/05/20 1636

## 2020-09-27 NOTE — ED Notes (Signed)
This RN called [facility] for Hospitalist Consultation for [employer], MD.

## 2020-09-27 NOTE — Sepsis Progress Note (Signed)
Following for sepsis monitoring ?

## 2020-09-27 NOTE — Plan of Care (Signed)
[facility] will assume care on arrival to accepting facility. Until arrival, care as per EDP. However, [facility] available 24/7 for questions and assistance.  Nursing staff please page [facility] Admits and Consults ([phone]) as soon as the patient arrives the hospital.   

## 2020-09-28 ENCOUNTER — Other Ambulatory Visit (HOSPITAL_BASED_OUTPATIENT_CLINIC_OR_DEPARTMENT_OTHER): Payer: Self-pay

## 2020-09-28 DIAGNOSIS — Z87440 Personal history of urinary (tract) infections: Secondary | ICD-10-CM | POA: Diagnosis not present

## 2020-09-28 DIAGNOSIS — B957 Other staphylococcus as the cause of diseases classified elsewhere: Secondary | ICD-10-CM

## 2020-09-28 DIAGNOSIS — I110 Hypertensive heart disease with heart failure: Secondary | ICD-10-CM | POA: Diagnosis present

## 2020-09-28 DIAGNOSIS — J9621 Acute and chronic respiratory failure with hypoxia: Secondary | ICD-10-CM

## 2020-09-28 DIAGNOSIS — G894 Chronic pain syndrome: Secondary | ICD-10-CM | POA: Diagnosis present

## 2020-09-28 DIAGNOSIS — R7881 Bacteremia: Secondary | ICD-10-CM

## 2020-09-28 DIAGNOSIS — M810 Age-related osteoporosis without current pathological fracture: Secondary | ICD-10-CM | POA: Diagnosis present

## 2020-09-28 DIAGNOSIS — Z681 Body mass index (BMI) 19 or less, adult: Secondary | ICD-10-CM | POA: Diagnosis not present

## 2020-09-28 DIAGNOSIS — U071 COVID-19: Secondary | ICD-10-CM | POA: Diagnosis present

## 2020-09-28 DIAGNOSIS — G9341 Metabolic encephalopathy: Secondary | ICD-10-CM | POA: Diagnosis present

## 2020-09-28 DIAGNOSIS — J440 Chronic obstructive pulmonary disease with acute lower respiratory infection: Secondary | ICD-10-CM | POA: Diagnosis present

## 2020-09-28 DIAGNOSIS — D649 Anemia, unspecified: Secondary | ICD-10-CM | POA: Diagnosis present

## 2020-09-28 DIAGNOSIS — J189 Pneumonia, unspecified organism: Secondary | ICD-10-CM | POA: Diagnosis present

## 2020-09-28 DIAGNOSIS — D689 Coagulation defect, unspecified: Secondary | ICD-10-CM

## 2020-09-28 DIAGNOSIS — I509 Heart failure, unspecified: Secondary | ICD-10-CM | POA: Diagnosis present

## 2020-09-28 DIAGNOSIS — E785 Hyperlipidemia, unspecified: Secondary | ICD-10-CM | POA: Diagnosis present

## 2020-09-28 DIAGNOSIS — K219 Gastro-esophageal reflux disease without esophagitis: Secondary | ICD-10-CM | POA: Diagnosis present

## 2020-09-28 DIAGNOSIS — E872 Acidosis, unspecified: Secondary | ICD-10-CM | POA: Diagnosis present

## 2020-09-28 DIAGNOSIS — M549 Dorsalgia, unspecified: Secondary | ICD-10-CM | POA: Diagnosis present

## 2020-09-28 DIAGNOSIS — R443 Hallucinations, unspecified: Secondary | ICD-10-CM | POA: Diagnosis present

## 2020-09-28 DIAGNOSIS — N179 Acute kidney failure, unspecified: Secondary | ICD-10-CM | POA: Diagnosis present

## 2020-09-28 DIAGNOSIS — Z9981 Dependence on supplemental oxygen: Secondary | ICD-10-CM | POA: Diagnosis not present

## 2020-09-28 DIAGNOSIS — H1131 Conjunctival hemorrhage, right eye: Secondary | ICD-10-CM | POA: Diagnosis present

## 2020-09-28 DIAGNOSIS — E43 Unspecified severe protein-calorie malnutrition: Secondary | ICD-10-CM | POA: Diagnosis present

## 2020-09-28 DIAGNOSIS — F05 Delirium due to known physiological condition: Secondary | ICD-10-CM | POA: Diagnosis present

## 2020-09-28 DIAGNOSIS — M419 Scoliosis, unspecified: Secondary | ICD-10-CM | POA: Diagnosis present

## 2020-09-28 LAB — CBC WITH DIFFERENTIAL/PLATELET
Abs Immature Granulocytes: 0.03 10*3/uL (ref 0.00–0.07)
Basophils Absolute: 0 10*3/uL (ref 0.0–0.1)
Basophils Relative: 1 %
Eosinophils Absolute: 0 10*3/uL (ref 0.0–0.5)
Eosinophils Relative: 1 %
HCT: 28.1 % — ABNORMAL LOW (ref 36.0–46.0)
Hemoglobin: 9.2 g/dL — ABNORMAL LOW (ref 12.0–15.0)
Immature Granulocytes: 1 %
Lymphocytes Relative: 15 %
Lymphs Abs: 0.4 10*3/uL — ABNORMAL LOW (ref 0.7–4.0)
MCH: 30.1 pg (ref 26.0–34.0)
MCHC: 32.7 g/dL (ref 30.0–36.0)
MCV: 91.8 fL (ref 80.0–100.0)
Monocytes Absolute: 0.3 10*3/uL (ref 0.1–1.0)
Monocytes Relative: 9 %
Neutro Abs: 2 10*3/uL (ref 1.7–7.7)
Neutrophils Relative %: 73 %
Platelets: 214 10*3/uL (ref 150–400)
RBC: 3.06 MIL/uL — ABNORMAL LOW (ref 3.87–5.11)
RDW: 15.2 % (ref 11.5–15.5)
WBC: 2.8 10*3/uL — ABNORMAL LOW (ref 4.0–10.5)
nRBC: 0 % (ref 0.0–0.2)

## 2020-09-28 LAB — URINALYSIS, ROUTINE W REFLEX MICROSCOPIC
Bilirubin Urine: NEGATIVE
Glucose, UA: NEGATIVE mg/dL
Ketones, ur: 15 mg/dL — AB
Leukocytes,Ua: NEGATIVE
Nitrite: NEGATIVE
Specific Gravity, Urine: 1.04 — ABNORMAL HIGH (ref 1.005–1.030)
pH: 6 (ref 5.0–8.0)

## 2020-09-28 LAB — COMPREHENSIVE METABOLIC PANEL
ALT: 7 U/L (ref 0–44)
AST: 14 U/L — ABNORMAL LOW (ref 15–41)
Albumin: 2.3 g/dL — ABNORMAL LOW (ref 3.5–5.0)
Alkaline Phosphatase: 78 U/L (ref 38–126)
Anion gap: 11 (ref 5–15)
BUN: 19 mg/dL (ref 8–23)
CO2: 21 mmol/L — ABNORMAL LOW (ref 22–32)
Calcium: 8 mg/dL — ABNORMAL LOW (ref 8.9–10.3)
Chloride: 110 mmol/L (ref 98–111)
Creatinine, Ser: 1.02 mg/dL — ABNORMAL HIGH (ref 0.44–1.00)
GFR, Estimated: 51 mL/min — ABNORMAL LOW (ref >60)
Glucose, Bld: 81 mg/dL (ref 70–99)
Potassium: 3.4 mmol/L — ABNORMAL LOW (ref 3.5–5.1)
Sodium: 142 mmol/L (ref 135–145)
Total Bilirubin: 0.6 mg/dL (ref 0.3–1.2)
Total Protein: 5.2 g/dL — ABNORMAL LOW (ref 6.5–8.1)

## 2020-09-28 LAB — TYPE AND SCREEN
ABO/RH(D): O POS
Antibody Screen: NEGATIVE

## 2020-09-28 LAB — FIBRINOGEN: Fibrinogen: 399 mg/dL (ref 210–475)

## 2020-09-28 LAB — LACTATE DEHYDROGENASE: LDH: 166 U/L (ref 98–192)

## 2020-09-28 LAB — FERRITIN: Ferritin: 55 ng/mL (ref 11–307)

## 2020-09-28 LAB — PROCALCITONIN: Procalcitonin: 0.13 ng/mL

## 2020-09-28 LAB — MAGNESIUM: Magnesium: 1.7 mg/dL (ref 1.7–2.4)

## 2020-09-28 LAB — TROPONIN I (HIGH SENSITIVITY): Troponin I (High Sensitivity): 19 ng/L — ABNORMAL HIGH (ref <18)

## 2020-09-28 LAB — SALICYLATE LEVEL: Salicylate Lvl: 29.3 mg/dL (ref 7.0–30.0)

## 2020-09-28 LAB — D-DIMER, QUANTITATIVE: D-Dimer, Quant: 1.81 ug/mL-FEU — ABNORMAL HIGH (ref 0.00–0.50)

## 2020-09-28 LAB — PHOSPHORUS: Phosphorus: 3.6 mg/dL (ref 2.5–4.6)

## 2020-09-28 LAB — BRAIN NATRIURETIC PEPTIDE: B Natriuretic Peptide: 161.4 pg/mL — ABNORMAL HIGH (ref 0.0–100.0)

## 2020-09-28 LAB — HEPATITIS B SURFACE ANTIGEN: Hepatitis B Surface Ag: NONREACTIVE

## 2020-09-28 LAB — C-REACTIVE PROTEIN: CRP: 6.6 mg/dL — ABNORMAL HIGH (ref <1.0)

## 2020-09-28 MED ORDER — ACETAMINOPHEN 325 MG PO TABS
650.0000 mg | ORAL_TABLET | Freq: Four times a day (QID) | ORAL | Status: DC | PRN
  Administered 2020-09-28 – 2020-09-29 (×3): 650 mg via ORAL
  Filled 2020-09-28 (×3): qty 2

## 2020-09-28 MED ORDER — ALBUTEROL SULFATE HFA 108 (90 BASE) MCG/ACT IN AERS
2.0000 | INHALATION_SPRAY | Freq: Four times a day (QID) | RESPIRATORY_TRACT | Status: DC
  Administered 2020-09-28: 2 via RESPIRATORY_TRACT
  Filled 2020-09-28: qty 6.7

## 2020-09-28 MED ORDER — HALOPERIDOL LACTATE 5 MG/ML IJ SOLN
2.5000 mg | Freq: Once | INTRAMUSCULAR | Status: AC
  Administered 2020-09-28: 2.5 mg via INTRAVENOUS
  Filled 2020-09-28: qty 1

## 2020-09-28 MED ORDER — SODIUM CHLORIDE 0.9% FLUSH
3.0000 mL | Freq: Two times a day (BID) | INTRAVENOUS | Status: DC
  Administered 2020-09-28 – 2020-10-02 (×9): 3 mL via INTRAVENOUS

## 2020-09-28 MED ORDER — FAMOTIDINE 20 MG PO TABS
20.0000 mg | ORAL_TABLET | Freq: Every day | ORAL | Status: DC
  Administered 2020-09-29 – 2020-10-02 (×4): 20 mg via ORAL
  Filled 2020-09-28 (×4): qty 1

## 2020-09-28 MED ORDER — DEXAMETHASONE SODIUM PHOSPHATE 10 MG/ML IJ SOLN: 6.0000 mg | INTRAMUSCULAR | Status: DC

## 2020-09-28 MED ORDER — ZINC SULFATE 220 (50 ZN) MG PO CAPS
220.0000 mg | ORAL_CAPSULE | Freq: Every day | ORAL | Status: DC
  Administered 2020-09-28 – 2020-10-02 (×5): 220 mg via ORAL
  Filled 2020-09-28 (×5): qty 1

## 2020-09-28 MED ORDER — SODIUM CHLORIDE 0.9 % IV SOLN
100.0000 mg | Freq: Once | INTRAVENOUS | Status: AC
  Administered 2020-09-28: 100 mg via INTRAVENOUS

## 2020-09-28 MED ORDER — ENOXAPARIN SODIUM 300 MG/3ML IJ SOLN
20.0000 mg | INTRAMUSCULAR | Status: DC
  Filled 2020-09-28 (×2): qty 0.2

## 2020-09-28 MED ORDER — ONDANSETRON HCL 4 MG/2ML IJ SOLN
4.0000 mg | Freq: Four times a day (QID) | INTRAMUSCULAR | Status: DC | PRN
  Administered 2020-09-29: 4 mg via INTRAVENOUS
  Filled 2020-09-28: qty 2

## 2020-09-28 MED ORDER — SODIUM CHLORIDE 0.9 % IV SOLN: 100.0000 mg | Freq: Every day | INTRAVENOUS | Status: DC

## 2020-09-28 MED ORDER — ONDANSETRON HCL 4 MG PO TABS: 4.0000 mg | ORAL_TABLET | Freq: Four times a day (QID) | ORAL | Status: DC | PRN

## 2020-09-28 MED ORDER — OXYCODONE-ACETAMINOPHEN 7.5-325 MG PO TABS
1.0000 | ORAL_TABLET | Freq: Three times a day (TID) | ORAL | Status: DC | PRN
Start: 2020-09-28 — End: 2020-10-01
  Administered 2020-09-29 – 2020-10-01 (×5): 1 via ORAL
  Filled 2020-09-28 (×5): qty 1

## 2020-09-28 MED ORDER — ALBUTEROL SULFATE (2.5 MG/3ML) 0.083% IN NEBU: 2.5000 mg | INHALATION_SOLUTION | Freq: Four times a day (QID) | RESPIRATORY_TRACT | Status: DC | PRN

## 2020-09-28 MED ORDER — SODIUM CHLORIDE 0.9 % IV SOLN
500.0000 mg | INTRAVENOUS | Status: DC
  Administered 2020-09-29: 500 mg via INTRAVENOUS
  Filled 2020-09-28: qty 500

## 2020-09-28 MED ORDER — NYSTATIN 100000 UNIT/ML MT SUSP
5.0000 mL | Freq: Four times a day (QID) | OROMUCOSAL | Status: DC
  Administered 2020-09-29 – 2020-10-02 (×14): 500000 [IU] via ORAL
  Filled 2020-09-28 (×14): qty 5

## 2020-09-28 MED ORDER — GUAIFENESIN-DM 100-10 MG/5ML PO SYRP: 10.0000 mL | ORAL_SOLUTION | ORAL | Status: DC | PRN

## 2020-09-28 MED ORDER — LORAZEPAM 2 MG/ML IJ SOLN: 0.5000 mg | Freq: Four times a day (QID) | INTRAMUSCULAR | Status: DC | PRN

## 2020-09-28 MED ORDER — ASCORBIC ACID 500 MG PO TABS
500.0000 mg | ORAL_TABLET | Freq: Every day | ORAL | Status: DC
  Administered 2020-09-28 – 2020-10-02 (×5): 500 mg via ORAL
  Filled 2020-09-28 (×5): qty 1

## 2020-09-28 MED ORDER — POTASSIUM CHLORIDE CRYS ER 20 MEQ PO TBCR
40.0000 meq | EXTENDED_RELEASE_TABLET | ORAL | Status: AC
  Administered 2020-09-28: 40 meq via ORAL
  Filled 2020-09-28: qty 2

## 2020-09-28 MED ORDER — HYOSCYAMINE SULFATE ER 0.375 MG PO TB12
0.3750 mg | ORAL_TABLET | Freq: Two times a day (BID) | ORAL | Status: DC | PRN
  Filled 2020-09-28: qty 1

## 2020-09-28 MED ORDER — STERILE WATER FOR INJECTION IV SOLN
Freq: Once | INTRAVENOUS | Status: AC
  Filled 2020-09-28: qty 1000

## 2020-09-28 MED ORDER — SODIUM CHLORIDE 0.9 % IV SOLN: INTRAVENOUS | Status: DC

## 2020-09-28 NOTE — ED Notes (Signed)
Pt given peanut butter crackers and water. 

## 2020-09-28 NOTE — Progress Notes (Signed)
Pt currently sleeping, family at bedside, all questions answered from family at this time.

## 2020-09-28 NOTE — Progress Notes (Addendum)
After reevaluating patient's labs she was noted to have a salicylate level at the upper limit of normal with drop in hemoglobin down to 9.2 dL(but appears all 3 cell lines dropped like a dilutional effect).  Makes me more concerned for salicylate toxicity as possible factor for patient's acute change in personality reported in combination of everything else that is going on.  Placed patient on a sodium bicarb drip night.  Sure hemoglobin is a stable follow-up on stool guaiac.  Nystatin swish and swallow for reports of yeast infection in patient's mouth by family.

## 2020-09-28 NOTE — ED Notes (Signed)
99% on 2 L [place]

## 2020-09-28 NOTE — H&P (Addendum)
History and Physical    [name] [name] [id number] DOB: [birth year] DOA: 09/27/2020  Referring MD/NP/PA: Shela Leff, MD PCP: Debbrah Alar, NP  Patient coming from: Transfer from [facility]  Chief Complaint: Altered Mental status  I have personally briefly reviewed patient's old medical records in [place]   HPI: [name] [name] is a 87 y.o. female with medical history significant of hypertension, hyperlipidemia, CHF, COPD intermittently using 2 L of nasal cannula oxygen at night, chronic pain s/p spinal stimulator, and PUD/GERD who presents after being noted to be altered by family.  History is obtained from the patient's granddaughter over the phone and adopted granddaughter present at bedside due to he being altered.  At baseline patient lives alone and is normally able to perform all of her ADLs only using a walker intermittently.  Family lives close by and check on her frequently and her shop is  right neext door. Review patient's primary care provider notes reveal patient tested positive for COVID-19 on 8/2 with complaints of cough, congestion, wheezing, and shortness of breath.  She was treated with prednisone taper along with other therapies.  She has previously  vaccinated with Moderna and received a single booster dose.  Particularly over the last 4 days the patient had gone from being normally feisty to mean and combative which was unusual.  She also had not been eating or drinking much.  At baseline she takes [facility] powders  for pain at times although family tries to keep her from doing so as well as CBD oil.  She is supposed to be on 2 L of oxygen at night, but only uses it intermittently.  ED Course: Upon admission into the emergency department patient was seen to be afebrile, tachycardic, tachypneic, and with O2 saturations of 88% with improvement on 2 L nasal cannula oxygen.  Labs significant for hemoglobin 11.4, BUN 25, creatinine 1.22, INR 1.9, APTT 51..  Chest x-ray  showed no acute abnormalities.   CT scan noted a multifocal pneumonia likely viral or atypical in etiology. COVID-19 screening was positive.  Blood cultures had been obtained and she was given bolus of 1.25 L of lactated Ringer's.  She was also started on empiric antibiotics of Rocephin and azithromycin with remdesivir added after COVID-19 screening came back positive.  Patient had been pulling at lines and had been given Haldol prior to transport.  Review of Systems  Unable to perform ROS: Mental status change   Past Medical History:  Diagnosis Date   Abdominal pain    Resolved   Cardiac arrhythmia    CHF (congestive heart failure) (HCC)    Diastolic  on ECHO 0000000   Choledocholithiasis    Chronic anemia    Chronic back pain    COPD (chronic obstructive pulmonary disease) (HCC)    oxygen dependent at  night 2LPM   Daily headache    GERD (gastroesophageal reflux disease)    HCAP (healthcare-associated pneumonia) 03/28/2014   Hiatal hernia    Hyperlipidemia    Hypertension    On home oxygen therapy    "1L during the day; 2L q hs" (03/28/2014)   Osteoarthritis    "pretty much q where"    Osteoporosis    Pancolitis ([place])    Infectious vs. inflammatory   Pancreatitis    History of   Pneumonia 1930's; 2011 X 2; 02/2014   PONV (postoperative nausea and vomiting)    PUD (peptic ulcer disease)    Reactive airway disease that is not asthma  Recurrent UTI (urinary tract infection)    "here lately" (03/28/2014)   Scoliosis deformity of spine    history of intractable back pain    Past Surgical History:  Procedure Laterality Date   ABDOMINAL HYSTERECTOMY  1970s   BACK SURGERY     x 5   BREAST CYST EXCISION Right 1960's   CERVICAL LAMINECTOMY  1970s   CHOLECYSTECTOMY  2008   ERCP W/ SPHICTEROTOMY  02/2010   ESOPHAGOGASTRODUODENOSCOPY N/A 11/14/2013   Procedure: ESOPHAGOGASTRODUODENOSCOPY (EGD);  Surgeon: Missy Sabins, MD;  [facility] END[facility]COPY;  Service: Endoscopy;  Laterality:  N/A;   FLEXIBLE SIGMOIDOSCOPY N/A 01/24/2013   Procedure: FLEXIBLE SIGMOIDOSCOPY;  Surgeon: Missy Sabins, MD;  Location: [facility];  Service: Endoscopy;  Laterality: N/A;   PAIN PUMP IMPLANTATION  ~ 2010   back, dilaudid and bupravacaine   SPINAL CORD STIMULATOR IMPLANT  ~ 2009   SPINE SURGERY  1970's,1995,2007     reports that she quit smoking about 32 years ago. Her smoking use included cigarettes. She has a 12.00 pack-year smoking history. She has never used smokeless tobacco. She reports that she does not drink alcohol and does not use drugs.  Allergies  Allergen Reactions   Cefdinir Diarrhea   Codeine Diarrhea and Nausea And Vomiting    REACTION: n/v/d, HA   Influenza Vaccines     Stomach pain, headache   Morphine Diarrhea and Nausea And Vomiting    REACTION: n/v/d, HA   Sulfa Antibiotics Nausea Only    Sick     Zoledronic Acid Swelling    REACTION: Severe edema   Sulfasalazine Nausea Only    Sick     Family History  Problem Relation Age of Onset   Cancer Mother        uterine   Heart disease Father    Hypertension Other    Osteoporosis Neg Hx     Prior to Admission medications   Medication Sig Start Date End Date Taking? Authorizing Provider  acetaminophen (TYLENOL) 500 MG tablet Take 500 mg by mouth 2 (two) times daily as needed for headache.    [provider]  albuterol (PROVENTIL HFA;VENTOLIN HFA) 108 (90 Base) MCG/ACT inhaler Inhale 1-2 puffs into the lungs every 6 (six) hours as needed (wheezing & shortness of breath). Reported on 02/28/2015 10/09/15   Debbrah Alar, NP  Ascorbic Acid (VITAMIN C) 500 MG tablet Take 500 mg by mouth daily.      [provider]  budesonide-formoterol (SYMBICORT) 160-4.5 MCG/ACT inhaler Inhale 2 puffs 2 (two) times daily into the lungs. 12/05/16   Parrett, Fonnie Mu, NP  Calcium Carbonate-Vitamin D 600-400 MG-UNIT tablet Take 1 tablet by mouth 2 (two) times daily with a meal.      [provider]   cholecalciferol (VITAMIN D) 1000 units tablet Take 1,000 Units by mouth daily.    [provider]  diclofenac sodium (VOLTAREN) 1 % GEL Place 1 application onto the skin 2 (two) times daily as needed. 06/06/15   [provider]  hyoscyamine (LEVSIN SL) 0.125 MG SL tablet Take 0.125 mg by mouth as needed. Abdominal pain 11/24/11   [provider]  lactulose (CHRONULAC) 10 GM/15ML solution TAKE 2 TEASPOONFUL (10ML) BY MOUTH AT BEDTIME AS NEEDED FOR CONSTIPATION 03/30/18   Debbrah Alar, NP  Multiple Vitamins-Minerals (MULTIVITAMIN WITH MINERALS) tablet Take 1 tablet by mouth daily. 07/01/15   Debbrah Alar, NP  nitroGLYCERIN (NITROSTAT) 0.4 MG SL tablet Place 1 tablet (0.4 mg total) under  the tongue every 5 (five) minutes as needed for chest pain. 11/25/19   Debbrah Alar, NP  omeprazole (PRILOSEC) 40 MG capsule Take 1 capsule (40 mg total) by mouth 2 (two) times daily. 11/25/19   Debbrah Alar, NP  oxyCODONE-acetaminophen (PERCOCET) 7.5-325 MG tablet Take 1 tablet by mouth 2 (two) times daily as needed for severe pain.    [provider]  OXYGEN Inhale 2 L into the lungs as needed.    [provider]  promethazine (PHENERGAN) 25 MG suppository Place 1 suppository (25 mg total) rectally every 6 (six) hours as needed for nausea or vomiting. 07/27/19   Debbrah Alar, NP  VITAMIN E PO Take 1 tablet by mouth daily.    [provider]  Zoster Vaccine Adjuvanted [facility]) injection Inject 0.'5mg'$  IM now and again in 2-6 months. 11/25/19   Debbrah Alar, NP    Physical Exam:  Constitutional: Frail elderly female who appears to be in no acute distress Vitals:   09/28/20 0745 09/28/20 0800 09/28/20 0815 09/28/20 1000  BP:    (!) 149/83  Pulse: (!) 102 (!) 104 (!) 102 (!) 105  Resp: (!) '21 17 14 18  '$ Temp:      TempSrc:      SpO2: 96% 97% 96% 99%  Weight:      Height:       Eyes: PERRL, lids and conjunctivae  normal ENMT: Mucous membranes are dry. Posterior pharynx clear of any exudate or lesions.  Neck: normal, supple, no masses, no thyromegaly Respiratory: Decreased overall aeration with intermittent crackles and some rales appreciated.  O2 saturations currently maintained on 3 L nasal cannula oxygen. Cardiovascular: Regular rate and rhythm, no murmurs / rubs / gallops. No extremity edema. 2+ pedal pulses. No carotid bruits.  Abdomen: Device palpated at the right lower quadrant of abdomen. Musculoskeletal: no clubbing / cyanosis.   Skin: no rashes, lesions, ulcers. No induration Neurologic: Patient appears able to move all extremities. Psychiatric: Lethargic, unable to assess orientation at this time as patient only mumbles a few words    Labs on Admission: I have personally reviewed following labs and imaging studies  CBC: Recent Labs  Lab 09/27/20 2120  WBC 5.9  NEUTROABS 4.9  HGB 11.4*  HCT 36.0  MCV 91.1  PLT [id number]   Basic Metabolic Panel: Recent Labs  Lab 09/27/20 2120  NA 143  K 4.0  CL 106  CO2 20*  GLUCOSE 112*  BUN 25*  CREATININE 1.22*  CALCIUM 9.2   GFR: Estimated Creatinine Clearance: 18.6 mL/min (A) (by C-G formula based on SCr of 1.22 mg/dL (H)). Liver Function Tests: Recent Labs  Lab 09/27/20 2120  AST 11*  ALT <5  ALKPHOS 102  BILITOT 0.2*  PROT 6.9  ALBUMIN 3.9   No results for input(s): LIPASE, AMYLASE in the last 168 hours. No results for input(s): AMMONIA in the last 168 hours. Coagulation Profile: Recent Labs  Lab 09/27/20 2120  INR 1.9*   Cardiac Enzymes: No results for input(s): CKTOTAL, CKMB, CKMBINDEX, TROPONINI in the last 168 hours. BNP (last 3 results) No results for input(s): PROBNP in the last 8760 hours. HbA1C: No results for input(s): HGBA1C in the last 72 hours. CBG: No results for input(s): GLUCAP in the last 168 hours. Lipid Profile: No results for input(s): CHOL, HDL, LDLCALC, TRIG, CHOLHDL, LDLDIRECT in the last 72  hours. Thyroid Function Tests: No results for input(s): TSH, T4TOTAL, FREET4, T3FREE, THYROIDAB in the last 72 hours. Anemia Panel: No  results for input(s): VITAMINB12, FOLATE, FERRITIN, TIBC, IRON, RETICCTPCT in the last 72 hours. Urine analysis:    Component Value Date/Time   COLORURINE YELLOW 09/28/2020 0738   APPEARANCEUR CLEAR 09/28/2020 0738   LABSPEC 1.040 (H) 09/28/2020 0738   PHURINE 6.0 09/28/2020 0738   GLUCOSEU NEGATIVE 09/28/2020 0738   GLUCOSEU NEGATIVE 03/22/2018 1323   HGBUR SMALL (A) 09/28/2020 0738   BILIRUBINUR NEGATIVE 09/28/2020 0738   BILIRUBINUR 1+ 02/16/2018 1234   KETONESUR 15 (A) 09/28/2020 0738   PROTEINUR TRACE (A) 09/28/2020 0738   UROBILINOGEN 0.2 03/22/2018 1323   NITRITE NEGATIVE 09/28/2020 0738   LEUKOCYTESUR NEGATIVE 09/28/2020 0738   Sepsis Labs: Recent Results (from the past 240 hour(s))  Resp Panel by RT-PCR (Flu A&B, Covid) Nasopharyngeal Swab     Status: Abnormal   Collection Time: 09/27/20 10:39 PM   Specimen: Nasopharyngeal Swab; Nasopharyngeal(NP) swabs in vial transport medium  Result Value Ref Range Status   SARS Coronavirus 2 by RT PCR POSITIVE (A) NEGATIVE Final    Comment: RESULT CALLED TO, READ BACK BY AND VERIFIED WITH: L. VENEGAS RN 09/27/20 AT 2338 CH (NOTE) SARS-CoV-2 target nucleic acids are DETECTED.  The SARS-CoV-2 RNA is generally detectable in upper respiratory specimens during the acute phase of infection. Positive results are indicative of the presence of the identified virus, but do not rule out bacterial infection or co-infection with other pathogens not detected by the test. Clinical correlation with patient history and other diagnostic information is necessary to determine patient infection status. The expected result is Negative.  Fact Sheet for Patients: EntrepreneurPulse.com.au  Fact Sheet for Healthcare Providers: IncredibleEmployment.be  This test is not yet approved  or cleared by the [place] FDA and  has been authorized for detection and/or diagnosis of SARS-CoV-2 by FDA under an Emergency Use Authorization (EUA).  This EUA will remain in effect (meaning this test can be u sed) for the duration of  the COVID-19 declaration under Section 564(b)(1) of the Act, 21 U.S.C. section 360bbb-3(b)(1), unless the authorization is terminated or revoked sooner.     Influenza A by PCR NEGATIVE NEGATIVE Final   Influenza B by PCR NEGATIVE NEGATIVE Final    Comment: (NOTE) The Xpert Xpress SARS-CoV-2/FLU/RSV plus assay is intended as an aid in the diagnosis of influenza from Nasopharyngeal swab specimens and should not be used as a sole basis for treatment. Nasal washings and aspirates are unacceptable for Xpert Xpress SARS-CoV-2/FLU/RSV testing.  Fact Sheet for Patients: EntrepreneurPulse.com.au  Fact Sheet for Healthcare Providers: IncredibleEmployment.be  This test is not yet approved or cleared by the [place] FDA and has been authorized for detection and/or diagnosis of SARS-CoV-2 by FDA under an Emergency Use Authorization (EUA). This EUA will remain in effect (meaning this test can be used) for the duration of the COVID-19 declaration under Section 564(b)(1) of the Act, 21 U.S.C. section 360bbb-3(b)(1), unless the authorization is terminated or revoked.  Performed at [facility], [address] [place], [place] [address]      Radiological Exams on Admission: CT Angio Chest PE W/Cm &/Or Wo Cm  Result Date: 09/27/2020 CLINICAL DATA:  Concern for pulmonary embolism. EXAM: CT ANGIOGRAPHY CHEST WITH CONTRAST TECHNIQUE: Multidetector CT imaging of the chest was performed using the standard protocol during bolus administration of intravenous contrast. Multiplanar CT image reconstructions and MIPs were obtained to evaluate the vascular anatomy. CONTRAST:  82m OMNIPAQUE IOHEXOL 350 MG/ML  SOLN COMPARISON:  Chest CT dated 03/28/2014 and radiograph dated 09/27/2020. FINDINGS:  Cardiovascular: There is no cardiomegaly or pericardial effusion. There is coronary vascular calcification. Moderate atherosclerotic calcification of the thoracic aorta. The aorta is tortuous. No aneurysmal dilatation or dissection. No CT evidence of pulmonary embolism. Mediastinum/Nodes: No hilar or mediastinal adenopathy. Mild circumferential thickening of the esophagus. Mediastinal fluid collection. Lungs/Pleura: Scattered bilateral nodular densities with somewhat tree-in-bud appearance most consistent with multifocal pneumonia, likely viral or atypical in etiology. No lobar consolidation, pleural effusion, or pneumothorax. The central airways are patent. Upper Abdomen: Pneumobilia. Musculoskeletal: Osteopenia with scoliosis and degenerative changes of the spine. No acute osseous pathology. Thoracic spine stimulator. Review of the MIP images confirms the above findings. IMPRESSION: 1. No CT evidence of pulmonary embolism. 2. Multifocal pneumonia, likely viral or atypical in etiology. Clinical correlation and follow-up to resolution recommended. 3. Aortic Atherosclerosis (ICD10-I70.0). Electronically Signed   By: Anner Crete M.D.   On: 09/27/2020 22:37   DG Chest Portable 1 View  Result Date: 09/27/2020 CLINICAL DATA:  Shortness of breath EXAM: PORTABLE CHEST 1 VIEW COMPARISON:  11/17/2019 FINDINGS: Stable heart size. Atherosclerotic calcification of the aortic knob. Chronically hyperinflated lungs with coarsened interstitial markings. No focal airspace consolidation, pleural effusion, or pneumothorax. Scoliotic spinal curvature. Thoracic spinal stimulator leads are present. IMPRESSION: No active disease. Electronically Signed   By: Davina Poke D.O.   On: 09/27/2020 21:48    EKG: Independently reviewed.  Sinus tachycardia at 101 bpm with borderline right axis deviation.  Assessment/Plan Acute metabolic  encephalopathy: Patient noted to be acutely altered.  Normally patient was noted to be feisty and usually alert and oriented, but have been more argumentative and combative.  She had been given Haldol prior to being transported to hospital due to agitation for which she has been lethargic since this time.  Patient reportedly takes [facility] powders possibly could have taken more than she should have, but is also reportedly uses CBD oil and this on pain medications of oxycodone.  Question of symptoms secondary to infection vs. dehydration vs. ingestion(unintentional salicylate overdose) vs. other. -Admit to a medical telemetry bed -Neurochecks -Check UDS and salicylate level -Limit sedating medications at this time -Consider need of CT scan of head if symptoms do not improve -Please allow family to stay with the patient up at night if needed due to her being acutely altered  Acute on chronic respiratory failure with hypoxia secondary to pneumonia History of COVID-19: Patient at baseline is supposed to be on 2 L of oxygen at night.  Chest x-ray showing multifocal pneumonia.  Tested positive for COVID-19 on 8/2.  Previously treated with steroid taper with some improvement in symptoms.  Question the possibility of superimposed bacterial infection.  Patient has been started on empiric antibiotics of Rocephin and azithromycin in addition to being given remdesivir. -Discontinue contact precautions after obtaining information in regards when patient initially tested positive for COVID-19. -Continuous pulse oximetry with nasal cannula oxygen maintain O2 saturations greater than 92% -Check inflammatory markers and monitor daily -Discontinued remdesivir per pharmacy -Albuterol inhaler changed to albuterol nebs -Continue empiric antibiotics with Rocephin and azithromycin -Antitussives as needed -Vitamin C and zinc  Possible acute kidney injury: Patient presents with creatinine elevated up to 1.22 with BUN 25.   Baseline creatinine previously noted to be around 0.8-0.9.  This could be greater than a 0.3 increase and patient's baseline.  She had been given bolus IV fluids initially and placed on a rate. -Recheck kidney function -Normal saline IV fluids at 75 mL/h  Metabolic acidosis with elevated  anion gap: On admission patient was noted to have CO2 anion gap 17.  Question presentation is secondary to subsalicylate use. -Follow-up salicylate  Coagulopathy: Acute on chronic.  INR elevated at 1.9 and APTT 52.  Records note INR was previously elevated back in 2020 -Continue to monitor  Normocytic anemia: Acute.  Hemoglobin 11.4 g/dL on admission.  Previous baseline hemoglobin was within normal limits back in 2020.  Question possibility of a GI given patient's history NSAID use. -Type and screen for possible need of blood -Serial monitoring of H&H -Check stool guaiac  GERD -Substituted Pepcid for omeprazole  DVT prophylaxis: Discontinued Lovenox and placed on SCDs  Code Status: Full Family Communication: Family updated at bedside and over the phone Disposition Plan: To be determined Consults called: None Admission status: Inpatient, require more than 2 midnight hospital stay  Norval Morton MD [facility]   If 7PM-7AM, please contact night-coverage   09/28/2020, 11:38 AM

## 2020-09-28 NOTE — Sepsis Progress Note (Signed)
Antibiotics were scanned but not run until the blood cultures were drawn per Jeneen Rinks O'Neal's note @ 22:34

## 2020-09-28 NOTE — Plan of Care (Signed)
Return to baseline prior to admission

## 2020-09-28 NOTE — ED Provider Notes (Signed)
  Physical Exam  BP (!) 149/83   Pulse (!) 105   Temp 99.1 F (37.3 C) (Rectal)   Resp 18   Ht '4\' 11"'$  (1.499 m)   Wt 40.8 kg   SpO2 99%   BMI 18.18 kg/m   Physical Exam  ED Course/Procedures     Procedures  MDM    Patient reassessed this morning.  She is 51, being admitted for COVID-19.  Having some hallucinations/delirium overnight. Trying to get up from the bed.  Nursing staff has been trying to direct her.  I have ordered a Haldol, EKG reviewed from yesterday.  ED ECG REPORT   Date: 09/28/2020  Rate: 119  Rhythm: sinus tachycardia  QRS Axis: normal  Intervals: normal  ST/T Wave abnormalities: nonspecific ST/T changes  Conduction Disutrbances:none  Narrative Interpretation:   Old EKG Reviewed: unchanged  I have personally reviewed the EKG tracing and agree with the computerized printout as noted.        Varney Biles, MD 09/28/20 1016

## 2020-09-28 NOTE — ED Notes (Signed)
Attempted to call report. Advised the nurse is busy and will call back.

## 2020-09-29 ENCOUNTER — Inpatient Hospital Stay (HOSPITAL_COMMUNITY): Payer: Medicare HMO

## 2020-09-29 LAB — COMPREHENSIVE METABOLIC PANEL
ALT: 6 U/L (ref 0–44)
AST: 14 U/L — ABNORMAL LOW (ref 15–41)
Albumin: 2.4 g/dL — ABNORMAL LOW (ref 3.5–5.0)
Alkaline Phosphatase: 89 U/L (ref 38–126)
Anion gap: 8 (ref 5–15)
BUN: 19 mg/dL (ref 8–23)
CO2: 24 mmol/L (ref 22–32)
Calcium: 8.1 mg/dL — ABNORMAL LOW (ref 8.9–10.3)
Chloride: 110 mmol/L (ref 98–111)
Creatinine, Ser: 0.93 mg/dL (ref 0.44–1.00)
GFR, Estimated: 57 mL/min — ABNORMAL LOW (ref >60)
Glucose, Bld: 74 mg/dL (ref 70–99)
Potassium: 4.1 mmol/L (ref 3.5–5.1)
Sodium: 142 mmol/L (ref 135–145)
Total Bilirubin: 0.3 mg/dL (ref 0.3–1.2)
Total Protein: 5.4 g/dL — ABNORMAL LOW (ref 6.5–8.1)

## 2020-09-29 LAB — CBC WITH DIFFERENTIAL/PLATELET
Abs Immature Granulocytes: 0.03 10*3/uL (ref 0.00–0.07)
Basophils Absolute: 0 10*3/uL (ref 0.0–0.1)
Basophils Relative: 1 %
Eosinophils Absolute: 0.1 10*3/uL (ref 0.0–0.5)
Eosinophils Relative: 2 %
HCT: 30.6 % — ABNORMAL LOW (ref 36.0–46.0)
Hemoglobin: 9.6 g/dL — ABNORMAL LOW (ref 12.0–15.0)
Immature Granulocytes: 1 %
Lymphocytes Relative: 14 %
Lymphs Abs: 0.5 10*3/uL — ABNORMAL LOW (ref 0.7–4.0)
MCH: 29.4 pg (ref 26.0–34.0)
MCHC: 31.4 g/dL (ref 30.0–36.0)
MCV: 93.6 fL (ref 80.0–100.0)
Monocytes Absolute: 0.3 10*3/uL (ref 0.1–1.0)
Monocytes Relative: 9 %
Neutro Abs: 2.6 10*3/uL (ref 1.7–7.7)
Neutrophils Relative %: 73 %
Platelets: 246 10*3/uL (ref 150–400)
RBC: 3.27 MIL/uL — ABNORMAL LOW (ref 3.87–5.11)
RDW: 15.4 % (ref 11.5–15.5)
WBC: 3.5 10*3/uL — ABNORMAL LOW (ref 4.0–10.5)
nRBC: 0 % (ref 0.0–0.2)

## 2020-09-29 LAB — URINE CULTURE: Culture: NO GROWTH

## 2020-09-29 LAB — PHOSPHORUS: Phosphorus: 3.5 mg/dL (ref 2.5–4.6)

## 2020-09-29 LAB — MAGNESIUM: Magnesium: 1.8 mg/dL (ref 1.7–2.4)

## 2020-09-29 LAB — APTT: aPTT: 52 seconds — ABNORMAL HIGH (ref 24–36)

## 2020-09-29 LAB — D-DIMER, QUANTITATIVE: D-Dimer, Quant: 2.18 ug/mL-FEU — ABNORMAL HIGH (ref 0.00–0.50)

## 2020-09-29 LAB — TSH: TSH: 1.397 u[IU]/mL (ref 0.350–4.500)

## 2020-09-29 LAB — PROTIME-INR
INR: 1.9 — ABNORMAL HIGH (ref 0.8–1.2)
Prothrombin Time: 21.7 seconds — ABNORMAL HIGH (ref 11.4–15.2)

## 2020-09-29 LAB — SALICYLATE LEVEL: Salicylate Lvl: 16.3 mg/dL (ref 7.0–30.0)

## 2020-09-29 LAB — FERRITIN: Ferritin: 63 ng/mL (ref 11–307)

## 2020-09-29 LAB — C-REACTIVE PROTEIN: CRP: 7.9 mg/dL — ABNORMAL HIGH (ref <1.0)

## 2020-09-29 MED ORDER — ENSURE ENLIVE PO LIQD
237.0000 mL | Freq: Two times a day (BID) | ORAL | Status: DC
  Administered 2020-09-30 – 2020-10-02 (×4): 237 mL via ORAL

## 2020-09-29 MED ORDER — AZITHROMYCIN 500 MG PO TABS
500.0000 mg | ORAL_TABLET | Freq: Every day | ORAL | Status: AC
  Administered 2020-09-30 – 2020-10-02 (×3): 500 mg via ORAL
  Filled 2020-09-29 (×3): qty 1

## 2020-09-29 MED ORDER — ENOXAPARIN SODIUM 30 MG/0.3ML IJ SOSY
30.0000 mg | PREFILLED_SYRINGE | INTRAMUSCULAR | Status: DC
  Administered 2020-09-29 – 2020-10-02 (×4): 30 mg via SUBCUTANEOUS
  Filled 2020-09-29 (×4): qty 0.3

## 2020-09-29 NOTE — Progress Notes (Addendum)
PROGRESS NOTE        PATIENT DETAILS Name: [name] [name] Age: 87 y.o. Sex: female Date of Birth: 10/31/1925 Admit Date: 09/27/2020 Admitting Physician Norval Morton, MD PCP:O'Sullivan, Lenna Sciara, NP  Brief Narrative: Patient is a 87 y.o. female with history of HTN, HLD, COPD, chronic hypoxic respiratory failure on 2 L of oxygen (uses intermittently), chronic pain-s/p spinal stimulator in place-recent history of COVID-positive on 8/2-who presented with confusion-she was found to have multifocal pneumonia-and a salicylate level at the upper limit of normal.  Patient was subsequently admitted to the hospitalist service-see below for further details.  Significant events: 8/2>> COVID-positive 9/1>> admit for evaluation of confusion-found to have PNA/ha salicylate level in the upper limit of normal.  Significant studies: 9/1>> CTA chest: No PE, multifocal pneumonia.  Antimicrobial therapy: None  Microbiology data: 9/1>> blood culture: No growth 9/2>> urine culture: Pending  Procedures : None  Consults: None  DVT Prophylaxis : Place and maintain sequential compression device Start: 09/28/20 2217   Prophylactic Lovenox   Subjective: Completely awake and alert this morning-granddaughter at bedside.  Assessment/Plan: Acute metabolic encephalopathy: Felt to be multifactorial-from pneumonia-and salicylate (upper limit of normal).  Treated with IV antibiotics-bicarb infusion-significantly improved-suspect mentation is now back to baseline.    Community-acquired pneumonia: Continue Rocephin/Zithromax-await culture data.  Recent COVID-19 infection: Per H&P-tested positive on 8/2-no need for isolation or any further specific treatment.  AKI: Very mild-likely hemodynamically mediated-resolved.  Anion gap metabolic acidosis: Either from AKI or from salicylates.  Resolved-monitor periodically.  GERD: Continue PPI  Normocytic anemia: Stable-no evidence of  blood loss-suspect this is a chronic issue.  Mild drop in hemoglobin likely to IV fluids.  Chronic hypoxic respiratory failure-on 2 L of oxygen at home  Chronic pain syndrome-back pain/diffuse arthralgias/scoliosis: Spinal cord stimulator in place-have counseled regarding avoidance of further salicylate use.Per granddaughter-on as needed Percocet at home.  Follows with pain management-has been through numerous pain therapies including Lidoderm patches etc.  Chronic debility/deconditioning/frailty: Lives alone-son lives next door-numerous family members in/out of the house multiple times a day.  Await PT/OT eval-suspect will require home health.   Diet: Diet Order             Diet Heart Room service appropriate? Yes; Fluid consistency: Thin  Diet effective now                    Code Status: Full code   Family Communication: [place] daughter at bedside  Disposition Plan: Status is: Inpatient  Remains inpatient appropriate because:Inpatient level of care appropriate due to severity of illness  Dispo: The patient is from: Home              Anticipated d/c is to: Home              Patient currently is not medically stable to d/c.   Difficult to place patient No    Barriers to Discharge: Awaiting repeat salicylate level-awaiting evaluation by rehab services-possible discharge on 9/4.  Antimicrobial agents: Anti-infectives (From admission, onward)    Start     Dose/Rate Route Frequency Ordered Stop   09/29/20 1000  remdesivir 100 mg in sodium chloride 0.9 % 100 mL IVPB  Status:  Discontinued       See Hyperspace for full Linked Orders Report.   100 mg 200 mL/hr over 30 Minutes Intravenous Daily  09/28/20 0147 09/28/20 1213   09/29/20 0500  azithromycin (ZITHROMAX) 500 mg in sodium chloride 0.9 % 250 mL IVPB        500 mg 250 mL/hr over 60 Minutes Intravenous Every 24 hours 09/28/20 0751 10/03/20 0459   09/28/20 0215  remdesivir 100 mg in sodium chloride 0.9 % 100 mL IVPB        See Hyperspace for full Linked Orders Report.   100 mg 200 mL/hr over 30 Minutes Intravenous  Once 09/28/20 0147 09/28/20 0419   09/28/20 0200  remdesivir 100 mg in sodium chloride 0.9 % 100 mL IVPB       See Hyperspace for full Linked Orders Report.   100 mg 200 mL/hr over 30 Minutes Intravenous  Once 09/28/20 0147 09/28/20 0523   09/27/20 2200  cefTRIAXone (ROCEPHIN) 2 g in sodium chloride 0.9 % 100 mL IVPB        2 g 200 mL/hr over 30 Minutes Intravenous Every 24 hours 09/27/20 2146 10/02/20 2159   09/27/20 2200  azithromycin (ZITHROMAX) 500 mg in sodium chloride 0.9 % 250 mL IVPB  Status:  Discontinued        500 mg 250 mL/hr over 60 Minutes Intravenous Every 24 hours 09/27/20 2146 09/28/20 0751        Time spent: 35 minutes-Greater than 50% of this time was spent in counseling, explanation of diagnosis, planning of further management, and coordination of care.  MEDICATIONS: Scheduled Meds:  vitamin C  500 mg Oral Daily   famotidine  20 mg Oral Daily   nystatin  5 mL Oral QID   sodium chloride flush  3 mL Intravenous Q12H   zinc sulfate  220 mg Oral Daily   Continuous Infusions:  azithromycin 500 mg (09/29/20 0410)   cefTRIAXone (ROCEPHIN)  IV Stopped (09/28/20 2139)   PRN Meds:.acetaminophen, albuterol, guaiFENesin-dextromethorphan, hyoscyamine, LORazepam, ondansetron **OR** ondansetron (ZOFRAN) IV, oxyCODONE-acetaminophen   PHYSICAL EXAM: Vital signs: Vitals:   09/28/20 1946 09/28/20 2340 09/29/20 0303 09/29/20 1033  BP: 109/77 120/60 128/60 (!) 142/63  Pulse: 65 78 99 94  Resp: '18 14 15 14  '$ Temp: 97.9 F (36.6 C) 98.1 F (36.7 C) 97.8 F (36.6 C) 99.5 F (37.5 C)  TempSrc: Axillary Oral Oral Oral  SpO2: 97% 100% 100% 98%  Weight:      Height:       Filed Weights   09/27/20 2109  Weight: 40.8 kg   Body mass index is 18.18 kg/m.   Gen Exam:Alert awake-not in any distress HEENT:atraumatic, normocephalic Chest: B/L clear to auscultation  anteriorly CVS:S1S2 regular Abdomen:soft non tender, non distended Extremities:no edema Neurology: Non focal Skin: no rash  I have personally reviewed following labs and imaging studies  LABORATORY DATA: CBC: Recent Labs  Lab 09/27/20 2120 09/28/20 1256 09/29/20 0117  WBC 5.9 2.8* 3.5*  NEUTROABS 4.9 2.0 2.6  HGB 11.4* 9.2* 9.6*  HCT 36.0 28.1* 30.6*  MCV 91.1 91.8 93.6  PLT 316 214 0000000    Basic Metabolic Panel: Recent Labs  Lab 09/27/20 2120 09/28/20 1256 09/29/20 0117  NA 143 142 142  K 4.0 3.4* 4.1  CL 106 110 110  CO2 20* 21* 24  GLUCOSE 112* 81 74  BUN 25* 19 19  CREATININE 1.22* 1.02* 0.93  CALCIUM 9.2 8.0* 8.1*  MG  --  1.7 1.8  PHOS  --  3.6 3.5    GFR: Estimated Creatinine Clearance: 24.3 mL/min (by C-G formula based on SCr of 0.93 mg/dL).  Liver Function Tests: Recent Labs  Lab 09/27/20 2120 09/28/20 1256 09/29/20 0117  AST 11* 14* 14*  ALT '5 7 6  '$ ALKPHOS 102 78 89  BILITOT 0.2* 0.6 0.3  PROT 6.9 5.2* 5.4*  ALBUMIN 3.9 2.3* 2.4*   No results for input(s): LIPASE, AMYLASE in the last 168 hours. No results for input(s): AMMONIA in the last 168 hours.  Coagulation Profile: Recent Labs  Lab 09/27/20 2120 09/29/20 0117  INR 1.9* 1.9*    Cardiac Enzymes: No results for input(s): CKTOTAL, CKMB, CKMBINDEX, TROPONINI in the last 168 hours.  BNP (last 3 results) No results for input(s): PROBNP in the last 8760 hours.  Lipid Profile: No results for input(s): CHOL, HDL, LDLCALC, TRIG, CHOLHDL, LDLDIRECT in the last 72 hours.  Thyroid Function Tests: Recent Labs    09/29/20 0117  TSH 1.397    Anemia Panel: Recent Labs    09/28/20 1256 09/29/20 0117  FERRITIN 55 63    Urine analysis:    Component Value Date/Time   COLORURINE YELLOW 09/28/2020 0738   APPEARANCEUR CLEAR 09/28/2020 0738   LABSPEC 1.040 (H) 09/28/2020 0738   PHURINE 6.0 09/28/2020 0738   GLUCOSEU NEGATIVE 09/28/2020 0738   GLUCOSEU NEGATIVE 03/22/2018 1323    HGBUR SMALL (A) 09/28/2020 0738   BILIRUBINUR NEGATIVE 09/28/2020 0738   BILIRUBINUR 1+ 02/16/2018 1234   KETONESUR 15 (A) 09/28/2020 0738   PROTEINUR TRACE (A) 09/28/2020 0738   UROBILINOGEN 0.2 03/22/2018 1323   NITRITE NEGATIVE 09/28/2020 0738   LEUKOCYTESUR NEGATIVE 09/28/2020 0738    Sepsis Labs: Lactic Acid, Venous    Component Value Date/Time   LATICACIDVEN 1.0 09/27/2020 2215    MICROBIOLOGY: Recent Results (from the past 240 hour(s))  Blood Culture (routine x 2)     Status: None (Preliminary result)   Collection Time: 09/27/20 10:15 PM   Specimen: BLOOD  Result Value Ref Range Status   Specimen Description   Final    BLOOD BOTTLES DRAWN AEROBIC AND ANAEROBIC Performed at [facility], [address], [place], [place] [address]    Special Requests   Final    Blood Culture adequate volume BLOOD LEFT ARM Performed at [facility], [address] [place], [place] [address]    Culture   Final    NO GROWTH < 24 HOURS Performed at [facility], [place] [address] [place], [place] [address]    Report Status PENDING  Incomplete  Blood Culture (routine x 2)     Status: None (Preliminary result)   Collection Time: 09/27/20 10:16 PM   Specimen: BLOOD  Result Value Ref Range Status   Specimen Description   Final    BLOOD BOTTLES DRAWN AEROBIC AND ANAEROBIC Performed at [facility], [address], [place], [place] [address]    Special Requests   Final    Blood Culture adequate volume BLOOD RIGHT ARM Performed at [facility], [address], [place], [place] [address]    Culture   Final    NO GROWTH < 24 HOURS Performed at [facility], [place] [address], [place], [place] [address]    Report Status PENDING  Incomplete  Resp Panel by RT-PCR (Flu A&B, Covid) Nasopharyngeal Swab     Status: Abnormal   Collection Time: 09/27/20 10:39 PM   Specimen: Nasopharyngeal Swab;  Nasopharyngeal(NP) swabs in vial transport medium  Result Value Ref Range Status   SARS Coronavirus 2 by RT PCR POSITIVE (A) NEGATIVE Final  Comment: RESULT CALLED TO, READ BACK BY AND VERIFIED WITH: L. VENEGAS RN 09/27/20 AT 2338 CH (NOTE) SARS-CoV-2 target nucleic acids are DETECTED.  The SARS-CoV-2 RNA is generally detectable in upper respiratory specimens during the acute phase of infection. Positive results are indicative of the presence of the identified virus, but do not rule out bacterial infection or co-infection with other pathogens not detected by the test. Clinical correlation with patient history and other diagnostic information is necessary to determine patient infection status. The expected result is Negative.  Fact Sheet for Patients: EntrepreneurPulse.com.au  Fact Sheet for Healthcare Providers: IncredibleEmployment.be  This test is not yet approved or cleared by the [place] FDA and  has been authorized for detection and/or diagnosis of SARS-CoV-2 by FDA under an Emergency Use Authorization (EUA).  This EUA will remain in effect (meaning this test can be u sed) for the duration of  the COVID-19 declaration under Section 564(b)(1) of the Act, 21 U.S.C. section 360bbb-3(b)(1), unless the authorization is terminated or revoked sooner.     Influenza A by PCR NEGATIVE NEGATIVE Final   Influenza B by PCR NEGATIVE NEGATIVE Final    Comment: (NOTE) The Xpert Xpress SARS-CoV-2/FLU/RSV plus assay is intended as an aid in the diagnosis of influenza from Nasopharyngeal swab specimens and should not be used as a sole basis for treatment. Nasal washings and aspirates are unacceptable for Xpert Xpress SARS-CoV-2/FLU/RSV testing.  Fact Sheet for Patients: EntrepreneurPulse.com.au  Fact Sheet for Healthcare Providers: IncredibleEmployment.be  This test is not yet approved or cleared by the [place] FDA and has been authorized for detection and/or diagnosis of SARS-CoV-2 by FDA under an Emergency Use Authorization (EUA). This EUA will remain in effect (meaning this test can be used) for the duration of the COVID-19 declaration under Section 564(b)(1) of the Act, 21 U.S.C. section 360bbb-3(b)(1), unless the authorization is terminated or revoked.  Performed at [facility], [address] [place], [place] [address]     RADIOLOGY STUDIES/RESULTS: CT Angio Chest PE W/Cm &/Or Wo Cm  Result Date: 09/27/2020 CLINICAL DATA:  Concern for pulmonary embolism. EXAM: CT ANGIOGRAPHY CHEST WITH CONTRAST TECHNIQUE: Multidetector CT imaging of the chest was performed using the standard protocol during bolus administration of intravenous contrast. Multiplanar CT image reconstructions and MIPs were obtained to evaluate the vascular anatomy. CONTRAST:  30m OMNIPAQUE IOHEXOL 350 MG/ML SOLN COMPARISON:  Chest CT dated 03/28/2014 and radiograph dated 09/27/2020. FINDINGS: Cardiovascular: There is no cardiomegaly or pericardial effusion. There is coronary vascular calcification. Moderate atherosclerotic calcification of the thoracic aorta. The aorta is tortuous. No aneurysmal dilatation or dissection. No CT evidence of pulmonary embolism. Mediastinum/Nodes: No hilar or mediastinal adenopathy. Mild circumferential thickening of the esophagus. Mediastinal fluid collection. Lungs/Pleura: Scattered bilateral nodular densities with somewhat tree-in-bud appearance most consistent with multifocal pneumonia, likely viral or atypical in etiology. No lobar consolidation, pleural effusion, or pneumothorax. The central airways are patent. Upper Abdomen: Pneumobilia. Musculoskeletal: Osteopenia with scoliosis and degenerative changes of the spine. No acute osseous pathology. Thoracic spine stimulator. Review of the MIP images confirms the above findings. IMPRESSION: 1. No CT evidence of pulmonary  embolism. 2. Multifocal pneumonia, likely viral or atypical in etiology. Clinical correlation and follow-up to resolution recommended. 3. Aortic Atherosclerosis (ICD10-I70.0). Electronically Signed   By: AAnner CreteM.D.   On: 09/27/2020 22:37   DG Chest Portable 1 View  Result Date: 09/27/2020 CLINICAL DATA:  Shortness of breath EXAM: PORTABLE CHEST 1 VIEW COMPARISON:  11/17/2019 FINDINGS:  Stable heart size. Atherosclerotic calcification of the aortic knob. Chronically hyperinflated lungs with coarsened interstitial markings. No focal airspace consolidation, pleural effusion, or pneumothorax. Scoliotic spinal curvature. Thoracic spinal stimulator leads are present. IMPRESSION: No active disease. Electronically Signed   By: Davina Poke D.O.   On: 09/27/2020 21:48     LOS: 1 day   Oren Binet, MD  [facility]    To contact the attending provider between 7A-7P or the covering provider during after hours 7P-7A, please log into the web site [email or web address] and access using universal [place] password for that web site. If you do not have the password, please call the hospital operator.  09/29/2020, 10:55 AM

## 2020-09-29 NOTE — Progress Notes (Addendum)
HOSPITAL MEDICINE OVERNIGHT EVENT NOTE    Notified by nursing about increasing concern about worsening right eye subconjunctival hemorrhage with small hematoma adjacent to the eye.  Nursing and family also bring about concerned that patient is experiencing a progressively worsening headache that is worsening despite as needed analgesics.  It is worth noting that the patient has not exhibited any focal neurologic deficit.  Family is also concerned due to patient exhibiting an occasional cough when eating.  According to nursing and family subconjunctival hemorrhage was already evaluated by the day provider.  Due to nursing and family concerns about worsening headaches however we will go ahead and proceed with getting noncontrast CT imaging of the head.  We will go ahead and place an SLP evaluation ordered for the morning considering patient's concerns over occasional coughing with ingesting foods.  Concerning the subconjunctival hemorrhage if symptoms persist erythromycin ophthalmic ointment can be considered by the day team.   Vernelle Emerald  MD [facility]    ADDENDUM (9/4 1AM)  CT head reveals no acute disease.  Continue to monitor.   Sherryll Burger Ilyse Tremain

## 2020-09-29 NOTE — Progress Notes (Signed)
PHARMACIST - PHYSICIAN COMMUNICATION  DR: Sloan Leiter  CONCERNING: IV to Oral Route Change Policy  RECOMMENDATION: This patient is receiving azithromycin by the intravenous route.  Based on criteria approved by the Pharmacy and Therapeutics Committee, the intravenous medication(s) is/are being converted to the equivalent oral dose form(s).   DESCRIPTION: These criteria include: The patient is eating (either orally or via tube) and/or has been taking other orally administered medications for a least 24 hours The patient has no evidence of active gastrointestinal bleeding or impaired GI absorption (gastrectomy, short bowel, patient on TNA or NPO).  If you have questions about this conversion, please contact the Pharmacy Department  '[x]'$   [phone] )  [place], [facility] 09/29/2020 2:08 PM

## 2020-09-30 LAB — C-REACTIVE PROTEIN: CRP: 8.6 mg/dL — ABNORMAL HIGH (ref <1.0)

## 2020-09-30 LAB — D-DIMER, QUANTITATIVE: D-Dimer, Quant: 2.06 ug/mL-FEU — ABNORMAL HIGH (ref 0.00–0.50)

## 2020-09-30 MED ORDER — POLYETHYLENE GLYCOL 3350 17 G PO PACK
17.0000 g | PACK | Freq: Every day | ORAL | Status: DC
  Administered 2020-09-30: 17 g via ORAL
  Filled 2020-09-30 (×2): qty 1

## 2020-09-30 MED ORDER — KETOROLAC TROMETHAMINE 15 MG/ML IJ SOLN
15.0000 mg | Freq: Once | INTRAMUSCULAR | Status: AC
  Administered 2020-09-30: 15 mg via INTRAVENOUS
  Filled 2020-09-30: qty 1

## 2020-09-30 NOTE — Plan of Care (Signed)

## 2020-09-30 NOTE — Evaluation (Addendum)
Physical Therapy Evaluation Patient Details Name: [name] [name] MRN: [id number] DOB: 1925-07-08 Today's Date: 09/30/2020   History of Present Illness  87 y.o. female presented 09/27/20 after being noted to be altered by family. Was +COVID 8/2 and on admission. +pneumonia and elevated salicylate level; CTA chest negative for PE  PMH significant of hypertension, hyperlipidemia, CHF, COPD intermittently using 2 L of nasal cannula oxygen at night, chronic pain s/p spinal stimulator, and PUD/GERD  Clinical Impression   Pt admitted secondary to problem above with deficits below. PTA patient was living alone and independent with mobility and basic ADLs.  Pt currently requires minguard to min assist with one loss of balance with stepping backwards to sit on bed (pt reports foot slipped out of her slipper). Family present and report she is mentally clear at times and still having periods of confusion.   Daughter and grandaughter present throughout session. Family member also present via Facetime asking for several days of inpt rehab prior to discharge. Explained to family that she is moving too well and does not have medical diagnosis to support CIR admission. Offered other option of SNF if they cannot provide 24/7 initial support and they ADAMANTLY refused SNf and said they will figure it out. Anticipate patient will benefit from PT to address problems listed below.Will continue to follow acutely to maximize functional mobility independence and safety.       Follow Up Recommendations Home health PT (recommend HHPT for further DME instruction)    Equipment Recommendations  None recommended by PT    Recommendations for Other Services OT consult     Precautions / Restrictions Precautions Precautions: Fall Precaution Comments: denies any previous falls      Mobility  Bed Mobility Overal bed mobility: Modified Independent             General bed mobility comments: HOB slightly elevated supine  <> sit    Transfers Overall transfer level: Needs assistance Equipment used: Rolling walker (2 wheeled) Transfers: Sit to/from Stand Sit to Stand: Min guard         General transfer comment: vc for hand placment (pt does not usually use a RW); no physical assist needed x 3 reps  Ambulation/Gait Ambulation/Gait assistance: Min guard Gait Distance (Feet): 60 Feet Assistive device: Rolling walker (2 wheeled) Gait Pattern/deviations: Step-through pattern;Decreased stride length   Gait velocity interpretation: 1.31 - 2.62 ft/sec, indicative of limited community ambulator General Gait Details: pt able to maneuver RW without assist (around obstacles in room; turn 180 degrees); when stepping backwards had one instance of ?slip vs knee buckling--pt reported she stepped out of her slipper onto back edge of slipper and slipped  Stairs            Wheelchair Mobility    Modified Rankin (Stroke Patients Only)       Balance Overall balance assessment: Needs assistance Sitting-balance support: No upper extremity supported;Feet unsupported Sitting balance-Leahy Scale: Good     Standing balance support: No upper extremity supported Standing balance-Leahy Scale: Fair Standing balance comment: unsteady with stepping backwards x 1 (appeared due to slipper)                             Pertinent Vitals/Pain Pain Assessment: No/denies pain    Home Living Family/patient expects to be discharged to:: Private residence Living Arrangements: Alone Available Help at Discharge: Family;Available 24 hours/day (dtr and grandaughter and extended family plan to arrange) Type of Home:  House Home Access: Stairs to enter Entrance Stairs-Rails: Psychiatric nurse of Steps: 6 Home Layout: One level Home Equipment: [place] - 2 wheels;Cane - single point;Bedside commode;Shower seat;Grab bars - tub/shower;Hand held shower head;Electric scooter Additional Comments: *pt's  home set-up; she may go stay with daughter on discharge vs them stay with her at her house    Prior Function Level of Independence: Independent with assistive device(s)         Comments: usually does not use a device inside home; uses BSC at night if she has to get up; uses cane when she goes out     Hand Dominance   Dominant Hand: Left    Extremity/Trunk Assessment   Upper Extremity Assessment Upper Extremity Assessment: Defer to OT evaluation    Lower Extremity Assessment Lower Extremity Assessment: Generalized weakness    Cervical / Trunk Assessment Cervical / Trunk Assessment: Normal  Communication   Communication: No difficulties  Cognition Arousal/Alertness: Awake/alert Behavior During Therapy: WFL for tasks assessed/performed Overall Cognitive Status: Within Functional Limits for tasks assessed                                 General Comments: pt oriented to self, family members present, month (day, year NT), location; able to provide all home setup correctly per family; some mild confusion related to a daughter in law that normally comes to stay with her (she is currently out of town in [place] per family)      General Comments General comments (skin integrity, edema, etc.): Daughter and grandaughter present throughout session. Family member also present via Facetime asking for several days of inpt rehab prior to discharge. Explained to family that she is moving too well and does not have medical diagnosis to support CIR admission. Offered other option of SNF if they cannot provide 24/7 initial support and they ADAMANTLY refused SNf and said they will figure it out.    Exercises     Assessment/Plan    PT Assessment Patient needs continued PT services  PT Problem List Decreased strength;Decreased balance;Decreased mobility;Decreased cognition;Decreased knowledge of use of DME       PT Treatment Interventions DME instruction;Gait training;Stair  training;Functional mobility training;Therapeutic activities;Therapeutic exercise;Balance training;Cognitive remediation;Patient/family education    PT Goals (Current goals can be found in the Care Plan section)  Acute Rehab PT Goals Patient Stated Goal: return to her home PT Goal Formulation: With patient/family Time For Goal Achievement: 10/14/20 Potential to Achieve Goals: Good    Frequency Min 3X/week   Barriers to discharge Decreased caregiver support Family reports they will find a way to provide 24/7 care    Co-evaluation               AM-PAC PT "6 Clicks" Mobility  Outcome Measure Help needed turning from your back to your side while in a flat bed without using bedrails?: None Help needed moving from lying on your back to sitting on the side of a flat bed without using bedrails?: None Help needed moving to and from a bed to a chair (including a wheelchair)?: A Little Help needed standing up from a chair using your arms (e.g., wheelchair or bedside chair)?: A Little Help needed to walk in hospital room?: A Little Help needed climbing 3-5 steps with a railing? : A Little 6 Click Score: 20    End of Session Equipment Utilized During Treatment: Gait belt Activity Tolerance: Patient tolerated treatment  well Patient left: in bed;with call bell/phone within reach;with bed alarm set;with family/visitor present Nurse Communication: Mobility status;Other (comment) (discharge plan discussion with family) PT Visit Diagnosis: Unsteadiness on feet (R26.81)    Time: 1030-1105 PT Time Calculation (min) (ACUTE ONLY): 35 min   Charges:   PT Evaluation $PT Eval Low Complexity: 1 Low PT Treatments $Gait Training: 8-22 mins         Arby Barrette, PT Pager [phone]   Rexanne Mano 09/30/2020, 12:07 PM

## 2020-09-30 NOTE — Evaluation (Signed)
Clinical/Bedside Swallow Evaluation Patient Details  Name: [name] [name] MRN: [id number] Date of Birth: December 05, 1925  Today's Date: 09/30/2020 Time: SLP Start Time (ACUTE ONLY): [id number] SLP Stop Time (ACUTE ONLY): [id number] SLP Time Calculation (min) (ACUTE ONLY): 22 min  Past Medical History:  Past Medical History:  Diagnosis Date   Abdominal pain    Resolved   Cardiac arrhythmia    CHF (congestive heart failure) (HCC)    Diastolic  on ECHO 0000000   Choledocholithiasis    Chronic anemia    Chronic back pain    COPD (chronic obstructive pulmonary disease) (HCC)    oxygen dependent at  night 2LPM   Daily headache    GERD (gastroesophageal reflux disease)    HCAP (healthcare-associated pneumonia) 03/28/2014   Hiatal hernia    Hyperlipidemia    Hypertension    On home oxygen therapy    "1L during the day; 2L q hs" (03/28/2014)   Osteoarthritis    "pretty much q where"    Osteoporosis    Pancolitis ([place])    Infectious vs. inflammatory   Pancreatitis    History of   Pneumonia 1930's; 2011 X 2; 02/2014   PONV (postoperative nausea and vomiting)    PUD (peptic ulcer disease)    Reactive airway disease that is not asthma    Recurrent UTI (urinary tract infection)    "here lately" (03/28/2014)   Scoliosis deformity of spine    history of intractable back pain   Past Surgical History:  Past Surgical History:  Procedure Laterality Date   ABDOMINAL HYSTERECTOMY  1970s   BACK SURGERY     x 5   BREAST CYST EXCISION Right 1960's   CERVICAL LAMINECTOMY  1970s   CHOLECYSTECTOMY  2008   ERCP W/ SPHICTEROTOMY  02/2010   ESOPHAGOGASTRODUODENOSCOPY N/A 11/14/2013   Procedure: ESOPHAGOGASTRODUODENOSCOPY (EGD);  Surgeon: Missy Sabins, MD;  Location: [facility] ENDOSCOPY;  Service: Endoscopy;  Laterality: N/A;   FLEXIBLE SIGMOIDOSCOPY N/A 01/24/2013   Procedure: FLEXIBLE SIGMOIDOSCOPY;  Surgeon: Missy Sabins, MD;  Location: [facility];  Service: Endoscopy;  Laterality: N/A;   PAIN PUMP IMPLANTATION  ~ 2010    back, dilaudid and bupravacaine   SPINAL CORD STIMULATOR IMPLANT  ~ 2009   SPINE SURGERY  1970's,1995,2007   HPI:  Pt is a 87 y.o. female who presented 09/01 with AMS. CT head negative for acute changes.  CXR 9/1 showed no active disease. SLP consulted due to family's reports of patient exhibiting an occasional cough when eating. PMH: hypertension, hyperlipidemia, CHF, COPD intermittently using 2 L of nasal cannula oxygen at night, chronic pain s/p spinal stimulator, and PUD/GERD, COVID-19 08/28/20. BSE 02/05/18: functional oropharyngeal swallow with adequate attention to and mastication of solids; regular texture diet with thin liquis recommended.   Assessment / Plan / Recommendation Clinical Impression  Pt was seen for bedside swallow evaluation with her daughter present. Pt's daughter reported that the pt typically consumes regular texture/soft solids without overt s/sx of aspiration. However, she stated that since the pt has been ill she has been eating reclined in bed and inconsistently demonstrating coughing with meals. Oral mechanism exam was limited due to pt's difficulty following commands; however, oral motor strength and ROM appeared grossly WFL. Dentition was limited to maxillary dentures. Pt's daughter stated that the pt owns mandibular teeth, but has not been wearing them due to an oral sore. Pt tolerated all solids and liquids without signs or symptoms of aspiration. Mastication was significantly prolonged with dysphagia 3  solids and bolus manipulation persisted despite adequate mastication. Pt ultimately spat these boluses out due to difficulty. SLP suspects that the pt's mentation and reduced bolus awareness are contributing factors to her reported symptoms. A dysphagia 2 diet with thin liquids is recommended at this time with observance of swallowing precautions including upright positioning during meals. SLP will follow pt to assess diet tolerance and for advancement as clinically  indicated. SLP Visit Diagnosis: Dysphagia, unspecified (R13.10)    Aspiration Risk  Mild aspiration risk    Diet Recommendation Dysphagia 2 (Fine chop);Thin liquid   Liquid Administration via: Cup;Straw Medication Administration: Whole meds with liquid Supervision: Patient able to self feed Compensations: Slow rate;Small sips/bites Postural Changes: Seated upright at 90 degrees    Other  Recommendations Oral Care Recommendations: Oral care BID   Follow up Recommendations  (TBD)      Frequency and Duration min 2x/week  2 weeks       Prognosis Prognosis for Safe Diet Advancement: Good Barriers to Reach Goals: Cognitive deficits      Swallow Study   General Date of Onset: 09/29/20 HPI: Pt is a 87 y.o. female who presented 09/01 with AMS. CT head negative for acute changes.  CXR 9/1 showed no active disease. SLP consulted due to family's reports of patient exhibiting an occasional cough when eating. PMH: hypertension, hyperlipidemia, CHF, COPD intermittently using 2 L of nasal cannula oxygen at night, chronic pain s/p spinal stimulator, and PUD/GERD, COVID-19 08/28/20. BSE 02/05/18: functional oropharyngeal swallow with adequate attention to and mastication of solids; regular texture diet with thin liquis recommended. Type of Study: Bedside Swallow Evaluation Previous Swallow Assessment: See HPI Diet Prior to this Study: Regular;Thin liquids Temperature Spikes Noted: No Respiratory Status: Room air History of Recent Intubation: No Behavior/Cognition: Alert;Cooperative;Confused Oral Cavity Assessment: Within Functional Limits Oral Care Completed by SLP: No Oral Cavity - Dentition: Dentures, top (absent mandibular teeth) Vision: Functional for self-feeding Self-Feeding Abilities: Needs assist Patient Positioning: Upright in bed;Postural control adequate for testing Baseline Vocal Quality: Normal Volitional Swallow: Able to elicit    Oral/Motor/Sensory Function Overall Oral  Motor/Sensory Function: Within functional limits   Ice Chips Ice chips: Within functional limits Presentation: Spoon   Thin Liquid Thin Liquid: Within functional limits Presentation: Straw    Nectar Thick Nectar Thick Liquid: Not tested   Honey Thick Honey Thick Liquid: Not tested   Puree Puree: Within functional limits Presentation: Spoon   Solid     Solid: Impaired Presentation: Spoon Oral Phase Impairments: Impaired mastication;Poor awareness of bolus Oral Phase Functional Implications: Impaired mastication;Oral holding     Dorlis Judice I. Hardin Negus, [place], [place] Office number [phone] Pager [phone]   Horton Marshall 09/30/2020,1:13 PM

## 2020-09-30 NOTE — Progress Notes (Signed)
PROGRESS NOTE        PATIENT DETAILS Name: [name] [name] Age: 87 y.o. Sex: female Date of Birth: 1925/08/08 Admit Date: 09/27/2020 Admitting Physician Norval Morton, MD PCP:O'Sullivan, Lenna Sciara, NP  Brief Narrative: Patient is a 87 y.o. female with history of HTN, HLD, COPD, chronic hypoxic respiratory failure on 2 L of oxygen (uses intermittently), chronic pain-s/p spinal stimulator in place-recent history of COVID-positive on 8/2-who presented with confusion-she was found to have multifocal pneumonia-and a salicylate level at the upper limit of normal.  Patient was subsequently admitted to the hospitalist service-see below for further details.  Significant events: 8/2>> COVID-positive 9/1>> admit for evaluation of confusion-found to have PNA/ha salicylate level in the upper limit of normal.  Significant studies: 9/1>> CTA chest: No PE, multifocal pneumonia. 9/3>> CT head: No acute intracranial abnormality.  Antimicrobial therapy: None  Microbiology data: 9/1>> blood culture: No growth 9/2>> urine culture: No growth  Procedures : None  Consults: None  DVT Prophylaxis : enoxaparin (LOVENOX) injection 30 mg Start: 09/29/20 1200 Place and maintain sequential compression device Start: 09/28/20 2217   Prophylactic Lovenox   Subjective: Complains of a nagging headache.  No complaints.  Apparently was confused last night.  Assessment/Plan: Acute metabolic encephalopathy: Improved-suspect she is not far from baseline-etiology felt to be multifactorial from pneumonia and possible salicylate toxicity (levels were in the upper limit of normal).  At risk for delirium given advanced age-possibility of cognitive dysfunction at baseline-explained to daughter at bedside to expect delirium.  Community-acquired pneumonia: Continue Rocephin/Zithromax-culture data negative so far.  Has mild borderline leukopenia (appears to have mild intermittent leukopenia dating  back to 2016).  Continue to monitor CBC periodically.  Recent COVID-19 infection: Per H&P-tested positive on 8/2-no need for isolation or any further specific treatment.  Minimally elevated D-dimer: Not hypoxic-CTA chest negative-unclear clinical significance-low suspicion for VTE given lack of hypoxemia/clinical features.  Continue prophylactic Lovenox.    AKI: Very mild-likely hemodynamically mediated-resolved.  Anion gap metabolic acidosis: Either from AKI or from salicylates.  Resolved-monitor periodically.  GERD: Continue PPI  Normocytic anemia: Stable-no evidence of blood loss-suspect this is a chronic issue.  Mild drop in hemoglobin likely to IV fluids.  Right subconjunctival hemorrhage: Supportive care  Chronic hypoxic respiratory failure-on 2 L of oxygen at home  Chronic pain syndrome-back pain/diffuse arthralgias/scoliosis: Spinal cord stimulator in place-have counseled regarding avoidance of further salicylate use.Per granddaughter-on as needed Percocet at home.  Follows with pain management-has been through numerous pain therapies including Lidoderm patches etc.  Chronic debility/deconditioning/frailty: Lives alone-son lives next door-numerous family members in/out of the house multiple times a day.  Family requesting CIR evaluation-even after explaining that she likely will not qualify given lack of supporting diagnoses.  Suspect home with home and services-but due to family's request-we will go ahead and consult CIR.   Diet: Diet Order             DIET DYS 2 Room service appropriate? Yes with Assist; Fluid consistency: Thin  Diet effective now                    Code Status: Full code   Family Communication: Daughter at bedside-spoke with Angelia Cox (works at [employer] on 9/4  Disposition Plan: Status is: Inpatient  Remains inpatient appropriate because:Inpatient level of care appropriate due to severity of illness  Dispo:  The patient is  from: Home              Anticipated d/c is to: Home              Patient currently is not medically stable to d/c.   Difficult to place patient No    Barriers to Discharge: Continue IV antibiotics-unlikely Home with home health services over the next 1-2 days.  Antimicrobial agents: Anti-infectives (From admission, onward)    Start     Dose/Rate Route Frequency Ordered Stop   09/30/20 1000  azithromycin (ZITHROMAX) tablet 500 mg        500 mg Oral Daily 09/29/20 1407 10/03/20 0959   09/29/20 1000  remdesivir 100 mg in sodium chloride 0.9 % 100 mL IVPB  Status:  Discontinued       See Hyperspace for full Linked Orders Report.   100 mg 200 mL/hr over 30 Minutes Intravenous Daily 09/28/20 0147 09/28/20 1213   09/29/20 0500  azithromycin (ZITHROMAX) 500 mg in sodium chloride 0.9 % 250 mL IVPB  Status:  Discontinued        500 mg 250 mL/hr over 60 Minutes Intravenous Every 24 hours 09/28/20 0751 09/29/20 1407   09/28/20 0215  remdesivir 100 mg in sodium chloride 0.9 % 100 mL IVPB       See Hyperspace for full Linked Orders Report.   100 mg 200 mL/hr over 30 Minutes Intravenous  Once 09/28/20 0147 09/28/20 0419   09/28/20 0200  remdesivir 100 mg in sodium chloride 0.9 % 100 mL IVPB       See Hyperspace for full Linked Orders Report.   100 mg 200 mL/hr over 30 Minutes Intravenous  Once 09/28/20 0147 09/28/20 0523   09/27/20 2200  cefTRIAXone (ROCEPHIN) 2 g in sodium chloride 0.9 % 100 mL IVPB        2 g 200 mL/hr over 30 Minutes Intravenous Every 24 hours 09/27/20 2146 10/02/20 2159   09/27/20 2200  azithromycin (ZITHROMAX) 500 mg in sodium chloride 0.9 % 250 mL IVPB  Status:  Discontinued        500 mg 250 mL/hr over 60 Minutes Intravenous Every 24 hours 09/27/20 2146 09/28/20 0751        Time spent: 35 minutes-Greater than 50% of this time was spent in counseling, explanation of diagnosis, planning of further management, and coordination of care.  MEDICATIONS: Scheduled  Meds:  vitamin C  500 mg Oral Daily   azithromycin  500 mg Oral Daily   enoxaparin (LOVENOX) injection  30 mg Subcutaneous Q24H   famotidine  20 mg Oral Daily   feeding supplement  237 mL Oral BID BM   nystatin  5 mL Oral QID   sodium chloride flush  3 mL Intravenous Q12H   zinc sulfate  220 mg Oral Daily   Continuous Infusions:  cefTRIAXone (ROCEPHIN)  IV 2 g (09/29/20 2120)   PRN Meds:.acetaminophen, albuterol, guaiFENesin-dextromethorphan, hyoscyamine, LORazepam, ondansetron **OR** ondansetron (ZOFRAN) IV, oxyCODONE-acetaminophen   PHYSICAL EXAM: Vital signs: Vitals:   09/30/20 0005 09/30/20 0309 09/30/20 0722 09/30/20 1144  BP: (!) 124/52 (!) 143/63 (!) 169/79 (!) 107/54  Pulse:  94 (!) 101 98  Resp: '16 14 19 14  '$ Temp: 97.9 F (36.6 C) 99.6 F (37.6 C) 99.7 F (37.6 C) 98.3 F (36.8 C)  TempSrc: Axillary Axillary Axillary Axillary  SpO2: 98% 97% 100% 95%  Weight:      Height:       [employer]  09/27/20 2109  Weight: 40.8 kg   Body mass index is 18.18 kg/m.   Gen Exam:Alert awake-not in any distress HEENT:atraumatic, normocephalic Chest: B/L clear to auscultation anteriorly CVS:S1S2 regular Abdomen:soft non tender, non distended Extremities:no edema Neurology: Non focal Skin: no rash  I have personally reviewed following labs and imaging studies  LABORATORY DATA: CBC: Recent Labs  Lab 09/27/20 2120 09/28/20 1256 09/29/20 0117  WBC 5.9 2.8* 3.5*  NEUTROABS 4.9 2.0 2.6  HGB 11.4* 9.2* 9.6*  HCT 36.0 28.1* 30.6*  MCV 91.1 91.8 93.6  PLT 316 214 246     Basic Metabolic Panel: Recent Labs  Lab 09/27/20 2120 09/28/20 1256 09/29/20 0117  NA 143 142 142  K 4.0 3.4* 4.1  CL 106 110 110  CO2 20* 21* 24  GLUCOSE 112* 81 74  BUN 25* 19 19  CREATININE 1.22* 1.02* 0.93  CALCIUM 9.2 8.0* 8.1*  MG  --  1.7 1.8  PHOS  --  3.6 3.5     GFR: Estimated Creatinine Clearance: 24.3 mL/min (by C-G formula based on SCr of 0.93 mg/dL).  Liver  Function Tests: Recent Labs  Lab 09/27/20 2120 09/28/20 1256 09/29/20 0117  AST 11* 14* 14*  ALT '5 7 6  '$ ALKPHOS 102 78 89  BILITOT 0.2* 0.6 0.3  PROT 6.9 5.2* 5.4*  ALBUMIN 3.9 2.3* 2.4*    No results for input(s): LIPASE, AMYLASE in the last 168 hours. No results for input(s): AMMONIA in the last 168 hours.  Coagulation Profile: Recent Labs  Lab 09/27/20 2120 09/29/20 0117  INR 1.9* 1.9*     Cardiac Enzymes: No results for input(s): CKTOTAL, CKMB, CKMBINDEX, TROPONINI in the last 168 hours.  BNP (last 3 results) No results for input(s): PROBNP in the last 8760 hours.  Lipid Profile: No results for input(s): CHOL, HDL, LDLCALC, TRIG, CHOLHDL, LDLDIRECT in the last 72 hours.  Thyroid Function Tests: Recent Labs    09/29/20 0117  TSH 1.397     Anemia Panel: Recent Labs    09/28/20 1256 09/29/20 0117  FERRITIN 55 63     Urine analysis:    Component Value Date/Time   COLORURINE YELLOW 09/28/2020 0738   APPEARANCEUR CLEAR 09/28/2020 0738   LABSPEC 1.040 (H) 09/28/2020 0738   PHURINE 6.0 09/28/2020 0738   GLUCOSEU NEGATIVE 09/28/2020 0738   GLUCOSEU NEGATIVE 03/22/2018 1323   HGBUR SMALL (A) 09/28/2020 0738   BILIRUBINUR NEGATIVE 09/28/2020 0738   BILIRUBINUR 1+ 02/16/2018 1234   KETONESUR 15 (A) 09/28/2020 0738   PROTEINUR TRACE (A) 09/28/2020 0738   UROBILINOGEN 0.2 03/22/2018 1323   NITRITE NEGATIVE 09/28/2020 0738   LEUKOCYTESUR NEGATIVE 09/28/2020 0738    Sepsis Labs: Lactic Acid, Venous    Component Value Date/Time   LATICACIDVEN 1.0 09/27/2020 2215    MICROBIOLOGY: Recent Results (from the past 240 hour(s))  Blood Culture (routine x 2)     Status: None (Preliminary result)   Collection Time: 09/27/20 10:15 PM   Specimen: BLOOD  Result Value Ref Range Status   Specimen Description   Final    BLOOD BOTTLES DRAWN AEROBIC AND ANAEROBIC Performed at [facility], [address] [place], [place] [address]     Special Requests   Final    Blood Culture adequate volume BLOOD LEFT ARM Performed at [facility], [address], [place], [place] [address]    Culture   Final    NO GROWTH 2 DAYS Performed at [facility], [address] [place], [place] [address]    Report Status PENDING  Incomplete  Blood Culture (routine x 2)     Status: None (Preliminary result)   Collection Time: 09/27/20 10:16 PM   Specimen: BLOOD  Result Value Ref Range Status   Specimen Description   Final    BLOOD BOTTLES DRAWN AEROBIC AND ANAEROBIC Performed at [facility], [address], [place], [place] [address]    Special Requests   Final    Blood Culture adequate volume BLOOD RIGHT ARM Performed at [facility], [address], [place], [place] [address]    Culture   Final    NO GROWTH 2 DAYS Performed at [facility], [place] [address] [place], [place] [address]    Report Status PENDING  Incomplete  Resp Panel by RT-PCR (Flu A&B, Covid) Nasopharyngeal Swab     Status: Abnormal   Collection Time: 09/27/20 10:39 PM   Specimen: Nasopharyngeal Swab; Nasopharyngeal(NP) swabs in vial transport medium  Result Value Ref Range Status   SARS Coronavirus 2 by RT PCR POSITIVE (A) NEGATIVE Final    Comment: RESULT CALLED TO, READ BACK BY AND VERIFIED WITH: L. VENEGAS RN 09/27/20 AT 2338 CH (NOTE) SARS-CoV-2 target nucleic acids are DETECTED.  The SARS-CoV-2 RNA is generally detectable in upper respiratory specimens during the acute phase of infection. Positive results are indicative of the presence of the identified virus, but do not rule out bacterial infection or co-infection with other pathogens not detected by the test. Clinical correlation with patient history and other diagnostic information is necessary to determine patient infection status. The expected result is Negative.  Fact Sheet for  Patients: EntrepreneurPulse.com.au  Fact Sheet for Healthcare Providers: IncredibleEmployment.be  This test is not yet approved or cleared by the [place] FDA and  has been authorized for detection and/or diagnosis of SARS-CoV-2 by FDA under an Emergency Use Authorization (EUA).  This EUA will remain in effect (meaning this test can be u sed) for the duration of  the COVID-19 declaration under Section 564(b)(1) of the Act, 21 U.S.C. section 360bbb-3(b)(1), unless the authorization is terminated or revoked sooner.     Influenza A by PCR NEGATIVE NEGATIVE Final   Influenza B by PCR NEGATIVE NEGATIVE Final    Comment: (NOTE) The Xpert Xpress SARS-CoV-2/FLU/RSV plus assay is intended as an aid in the diagnosis of influenza from Nasopharyngeal swab specimens and should not be used as a sole basis for treatment. Nasal washings and aspirates are unacceptable for Xpert Xpress SARS-CoV-2/FLU/RSV testing.  Fact Sheet for Patients: EntrepreneurPulse.com.au  Fact Sheet for Healthcare Providers: IncredibleEmployment.be  This test is not yet approved or cleared by the [place] FDA and has been authorized for detection and/or diagnosis of SARS-CoV-2 by FDA under an Emergency Use Authorization (EUA). This EUA will remain in effect (meaning this test can be used) for the duration of the COVID-19 declaration under Section 564(b)(1) of the Act, 21 U.S.C. section 360bbb-3(b)(1), unless the authorization is terminated or revoked.  Performed at [facility], [address], [place], [place] [address]   Urine Culture     Status: None   Collection Time: 09/28/20  7:39 AM   Specimen: Urine, Clean Catch  Result Value Ref Range Status   Specimen Description   Final    URINE, CLEAN CATCH Performed at [facility], [address] [place], [place] [address]    Special Requests    Final    NONE Performed  at [facility], [address] [place], [place] [address]    Culture   Final    NO GROWTH Performed at [facility], [place] [address], [place], [place] [address]    Report Status 09/29/2020 FINAL  Final    RADIOLOGY STUDIES/RESULTS: CT HEAD WO CONTRAST (5MM)  Result Date: 09/29/2020 CLINICAL DATA:  Headache EXAM: CT HEAD WITHOUT CONTRAST TECHNIQUE: Contiguous axial images were obtained from the base of the skull through the vertex without intravenous contrast. COMPARISON:  Head CT 04/27/2017 FINDINGS: Brain: There is no mass, hemorrhage or extra-axial collection. There is generalized atrophy without lobar predilection. Hypodensity of the white matter is most commonly associated with chronic microvascular disease. Vascular: No abnormal hyperdensity of the major intracranial arteries or dural venous sinuses. No intracranial atherosclerosis. Skull: The visualized skull base, calvarium and extracranial soft tissues are normal. Sinuses/Orbits: No fluid levels or advanced mucosal thickening of the visualized paranasal sinuses. No mastoid or middle ear effusion. The orbits are normal. IMPRESSION: Generalized atrophy and chronic microvascular ischemia without acute intracranial abnormality. Electronically Signed   By: Ulyses Jarred M.D.   On: 09/29/2020 23:01     LOS: 2 days   Oren Binet, MD  [facility]    To contact the attending provider between 7A-7P or the covering provider during after hours 7P-7A, please log into the web site [email or web address] and access using universal [place] password for that web site. If you do not have the password, please call the hospital operator.  09/30/2020, 1:36 PM

## 2020-10-01 LAB — CBC WITH DIFFERENTIAL/PLATELET
Abs Immature Granulocytes: 0 10*3/uL (ref 0.00–0.07)
Basophils Absolute: 0 10*3/uL (ref 0.0–0.1)
Basophils Relative: 1 %
Eosinophils Absolute: 0 10*3/uL (ref 0.0–0.5)
Eosinophils Relative: 0 %
HCT: 28.5 % — ABNORMAL LOW (ref 36.0–46.0)
Hemoglobin: 8.4 g/dL — ABNORMAL LOW (ref 12.0–15.0)
Lymphocytes Relative: 16 %
Lymphs Abs: 0.4 10*3/uL — ABNORMAL LOW (ref 0.7–4.0)
MCH: 29 pg (ref 26.0–34.0)
MCHC: 29.5 g/dL — ABNORMAL LOW (ref 30.0–36.0)
MCV: 98.3 fL (ref 80.0–100.0)
Monocytes Absolute: 0.1 10*3/uL (ref 0.1–1.0)
Monocytes Relative: 6 %
Myelocytes: 1 %
Neutro Abs: 1.7 10*3/uL (ref 1.7–7.7)
Neutrophils Relative %: 76 %
Platelets: 195 10*3/uL (ref 150–400)
RBC: 2.9 MIL/uL — ABNORMAL LOW (ref 3.87–5.11)
RDW: 15.3 % (ref 11.5–15.5)
WBC: 2.3 10*3/uL — ABNORMAL LOW (ref 4.0–10.5)
nRBC: 0 % (ref 0.0–0.2)
nRBC: 4 /100 WBC — ABNORMAL HIGH

## 2020-10-01 MED ORDER — QUETIAPINE FUMARATE 25 MG PO TABS: 25.0000 mg | ORAL_TABLET | Freq: Every evening | ORAL | Status: DC | PRN

## 2020-10-01 MED ORDER — KETOROLAC TROMETHAMINE 15 MG/ML IJ SOLN
15.0000 mg | Freq: Three times a day (TID) | INTRAMUSCULAR | Status: DC | PRN
  Administered 2020-10-01 – 2020-10-02 (×2): 15 mg via INTRAVENOUS
  Filled 2020-10-01 (×2): qty 1

## 2020-10-01 MED ORDER — ADULT MULTIVITAMIN W/MINERALS CH
1.0000 | ORAL_TABLET | Freq: Every day | ORAL | Status: DC
  Administered 2020-10-01 – 2020-10-02 (×2): 1 via ORAL
  Filled 2020-10-01 (×2): qty 1

## 2020-10-01 MED ORDER — LACTULOSE 10 GM/15ML PO SOLN
30.0000 g | Freq: Two times a day (BID) | ORAL | Status: DC | PRN
  Administered 2020-10-01: 30 g via ORAL
  Filled 2020-10-01: qty 45

## 2020-10-01 MED ORDER — OXYCODONE-ACETAMINOPHEN 5-325 MG PO TABS
1.0000 | ORAL_TABLET | Freq: Three times a day (TID) | ORAL | Status: DC | PRN
  Filled 2020-10-01: qty 1

## 2020-10-01 NOTE — Progress Notes (Signed)
Inpatient Rehabilitation Admissions Coordinator   Inpatient rehab consult received. I met with patient and her 2 granddaughters at bedside. They would like to pursue CIR admit prior to patient return home. Patient lived alone and was very independent  before admit. We discussed goals and expectations of a Cir admit and that I question if [facility] Medicare would approve. They would like to pursue. I will begin Auth and follow up when they make their determination.  Danne Baxter, RN, MSN Rehab Admissions Coordinator [phone] 10/01/2020 12:07 PM

## 2020-10-01 NOTE — Progress Notes (Signed)
Initial Nutrition Assessment  DOCUMENTATION CODES:      INTERVENTION:   Ensure Enlive - BID Provides 350 kcal & 20 gm PRO each  Multivitamin - Daily  Encourage good PO intakes   NUTRITION DIAGNOSIS:   Severe Malnutrition related to chronic illness (COPD) as evidenced by severe fat depletion, severe muscle depletion.   GOAL:   Patient will meet greater than or equal to 90% of their needs   MONITOR:   PO intake, Supplement acceptance  REASON FOR ASSESSMENT:   Malnutrition Screening Tool    ASSESSMENT:   Patient admitted for acute encephalopathy and multifactorial pneumonia. Pt with PMH of COPD, CHF, HTN, GERD, pancreatitis, COVID+ on 8/2, and surgical history of cholecystectomy.   Pt family members present at bedside at time of visit and able to provide nutrition related information. Pt primarily takes care of herself, will cook for herself/find something in the house to eat or family members occasionally bring something for her (family lives next door). Pt typically eats softer foods. Typical intake PTA, Breakfast: Frosted Flakes cereal, Lunch: cottage cheese, soup, sandwich, or whatever she may find, Dinner: similar to lunch. Pt typically drinks Coke, water, and coffee. Family reports a UBW of 140-150# about 2 years ago, since then has lost weight. Pt ambulates with no assistance while at home (may lean on furniture), uses a cane/walker when outside the home.  Family was feeding pt during visit and pt was drinking coffee and an Ensure Plus. Pt reports enjoying the Ensure and wants to continue to drink them. Discussed with the family know that she can continue to drink them at home to help with calories and protein. Recorded intakes of 10-50% during admission.   Pt to continue IV antibiotics prior to d/c. Discussion of CIR after dc from [facility].   Medications reviewed and include: Vitamin C, Zinc, Miralax Labs reviewed.  NUTRITION - FOCUSED PHYSICAL EXAM:  Flowsheet Row  Most Recent Value  Orbital Region Severe depletion  Upper Arm Region Severe depletion  Thoracic and Lumbar Region Severe depletion  Buccal Region Moderate depletion  Temple Region Severe depletion  Clavicle Bone Region Severe depletion  Clavicle and Acromion Bone Region Severe depletion  Scapular Bone Region Severe depletion  Dorsal Hand Severe depletion  Patellar Region Moderate depletion  Anterior Thigh Region Moderate depletion  Posterior Calf Region Moderate depletion  Edema (RD Assessment) None  Hair Reviewed  Eyes Reviewed  Mouth Reviewed  [Tongue Smooth]  Skin Reviewed  Nails Reviewed       Diet Order:   Diet Order             DIET DYS 2 Room service appropriate? Yes with Assist; Fluid consistency: Thin  Diet effective now                   EDUCATION NEEDS:   No education needs have been identified at this time  Skin:  Skin Assessment: Reviewed RN Assessment  Last BM:  Prior to admission  Height:   Ht Readings from Last 1 Encounters:  09/27/20 '4\' 11"'$  (1.499 m)    Weight:   Wt Readings from Last 1 Encounters:  09/27/20 40.8 kg    Ideal Body Weight:  95 kg  BMI:  Body mass index is 18.18 kg/m.  Estimated Nutritional Needs:   Kcal:  1450-1650  Protein:  70-80 gm  Fluid:  >/= 1500 mL    Hermina Barters BS, PLDN Clinical Dietitian See AMiON for contact information.

## 2020-10-01 NOTE — Discharge Instructions (Addendum)
Nutrition Post Hospital Stay °Proper nutrition can help your body recover from illness and injury.   °Foods and beverages high in protein, vitamins, and minerals help rebuild muscle loss, promote healing, & reduce fall risk.  ° °•In addition to eating healthy foods, a nutrition shake is an easy, delicious way to get the nutrition you need during and after your hospital stay ° °It is recommended that you continue to drink 2 bottles per day of:       Ensure Plus for at least 1 month (30 days) after your hospital stay  ° °Tips for adding a nutrition shake into your routine: °As allowed, drink one with vitamins or medications instead of water or juice °Enjoy one as a tasty mid-morning or afternoon snack °Drink cold or make a milkshake out of it °Drink one instead of milk with cereal or snacks °Use as a coffee creamer °  °Available at the following grocery stores and pharmacies:           °* [employer] * [employer] * [employer]  °* [employer]          * [employer] * [employer]  °* [facility]      * Target  * [employer]   °* [facility]  * [employer]   °* [facility] [phone]  °          °For COUPONS visit: [email or web address]/join or [email or web address]  ° °Suggested Substitutions °Ensure Plus = Boost Plus = Carnation Breakfast Essentials = Boost Compact ° ° °  ° °

## 2020-10-01 NOTE — Progress Notes (Signed)
  Speech Language Pathology Treatment: Dysphagia  Patient Details Name: [name] [name] MRN: [id number] DOB: [birth year] Today's Date: 10/01/2020 Time: 1320-1340 SLP Time Calculation (min) (ACUTE ONLY): 20 min  Assessment / Plan / Recommendation Clinical Impression  Visited [name] [name] and family at bedside. Family expressed ongoing concerns about prolonged mastication and drooling. We discussed the neuro processes of swallowing and how a softened fully masticated bolus is needed to elicit posterior transit of foods. Pts in general often chew for prolonged periods in times of acute confusion and excess saliva is a result of the body trying to moisten food to elicit transit. [name] [name] always wears her bottom dentures but cant tolerate them currently because hey are causing sores, so mastication is a struggle. Provided further recommendations for family to choose soft moist foods that [name] [name] enjoys without resorting to puree. [name] [name] showed no signs of pharyngeal dysphagia and tolerating sips well when upright and taking one sip at time. Reinforced that an occasional cough with liquids is likely protective and as long as precautions are being followed [name] [name] has low risk of significant aspiration of liquids. [name] [name] to continue dys 2/thin liquid diet.   HPI HPI: [name] [name] is a 87 y.o. female who presented 09/01 with AMS. CT head negative for acute changes.  CXR 9/1 showed no active disease. SLP consulted due to family's reports of patient exhibiting an occasional cough when eating. PMH: hypertension, hyperlipidemia, CHF, COPD intermittently using 2 L of nasal cannula oxygen at night, chronic pain s/p spinal stimulator, and PUD/GERD, COVID-19 08/28/20. BSE 02/05/18: functional oropharyngeal swallow with adequate attention to and mastication of solids; regular texture diet with thin liquis recommended.      SLP Plan  Continue with current plan of care       Recommendations  Diet recommendations: Dysphagia 2 (fine chop);Thin liquid Liquids provided  via: Cup;Straw Medication Administration: Whole meds with liquid Supervision: Staff to assist with self feeding;Trained caregiver to feed patient Compensations: Slow rate;Small sips/bites                SLP Visit Diagnosis: Dysphagia, unspecified (R13.10) Plan: Continue with current plan of care       GO                Lavona Norsworthy, Katherene Ponto 10/01/2020, 1:55 PM

## 2020-10-01 NOTE — Evaluation (Signed)
Occupational Therapy Evaluation Patient Details Name: [name] [name] MRN: [id number] DOB: [birth year] Today's Date: 10/01/2020    History of Present Illness 87 y.o. female presented 09/27/20 after being noted to be altered by family. Was +COVID 8/2 and on admission. +pneumonia and elevated salicylate level; CTA chest negative for PE  PMH significant of hypertension, hyperlipidemia, CHF, COPD intermittently using 2 L of nasal cannula oxygen at night, chronic pain s/p spinal stimulator, and PUD/GERD   Clinical Impression   Patient admitted for the diagnosis above.  PTA she lives alone with PRN check-ins from the family.  She is presenting with mild confusion and unsteadiness.  Family is hoping she can go to CIR for intensive rehab prior to returning home.  MD is placing a consult for CIR to review her case.  She is very active for her age, and could go home with [facility] services and increased assist from family for a short time.   OT will follow her in the acute setting to maximize her functional status, if CIR is accepted, OT can adjust recommendation.      Follow Up Recommendations  Home health OT;Supervision - Intermittent    Equipment Recommendations  None recommended by OT    Recommendations for Other Services       Precautions / Restrictions Precautions Precautions: Fall Restrictions Weight Bearing Restrictions: No      Mobility Bed Mobility Overal bed mobility: Modified Independent               Patient Response: Cooperative  Transfers Overall transfer level: Needs assistance Equipment used: 1 person hand held assist Transfers: Sit to/from [facility] Sit to Stand: Min guard Stand pivot transfers: Min guard            Balance Overall balance assessment: Needs assistance Sitting-balance support: No upper extremity supported;Feet unsupported Sitting balance-Leahy Scale: Good     Standing balance support: Single extremity supported Standing  balance-Leahy Scale: Fair                             ADL either performed or assessed with clinical judgement   ADL       Grooming: Wash/dry hands;Wash/dry face;Oral care;Min guard;Standing       Lower Body Bathing: Min guard;Sit to/from stand       Lower Body Dressing: Sit to/from stand;Minimal assistance   Toilet Transfer: Minimal Teacher, English as a foreign language;Ambulation   Toileting- Clothing Manipulation and Hygiene: Supervision/safety;Sitting/lateral lean               Vision Patient Visual Report: No change from baseline Additional Comments: R eye with bruise and blood to the white of her eye.     Perception  NT   Praxis  NT    Pertinent Vitals/Pain Pain Assessment: No/denies pain     Hand Dominance Left   Extremity/Trunk Assessment Upper Extremity Assessment Upper Extremity Assessment: Generalized weakness   Lower Extremity Assessment Lower Extremity Assessment: Defer to PT evaluation   Cervical / Trunk Assessment Cervical / Trunk Assessment: Normal   Communication Communication Communication: No difficulties   Cognition Arousal/Alertness: Awake/alert Behavior During Therapy: WFL for tasks assessed/performed Overall Cognitive Status: Within Functional Limits for tasks assessed                                 General Comments: forgetfulness noted, with minimal confusion per family.  "getting things here and  at home mixed up"   General Comments       Exercises     Shoulder Instructions      Home Living Family/patient expects to be discharged to:: Private residence Living Arrangements: Alone Available Help at Discharge: Family;Available 24 hours/day Type of Home: House Home Access: Stairs to enter [employer] of Steps: 6 Entrance Stairs-Rails: Right;Left Home Layout: One level     Bathroom Shower/Tub: Occupational psychologist: Standard Bathroom Accessibility: Yes How Accessible: Accessible  via walker Home Equipment: [place] - 2 wheels;Cane - single point;Bedside commode;Shower seat;Grab bars - tub/shower;Hand held shower head;Electric scooter          Prior Functioning/Environment Level of Independence: Independent with assistive device(s)        Comments: usually does not use a device inside home; uses BSC at night if she has to get up; uses cane when she goes out.  Family is close by and check in PRN.  She complete her own ADL, meals, meds and light home management.        OT Problem List: Decreased strength;Decreased activity tolerance;Impaired balance (sitting and/or standing)      OT Treatment/Interventions: Self-care/ADL training;Therapeutic exercise;Balance training;Patient/family education;Therapeutic activities    OT Goals(Current goals can be found in the care plan section) Acute Rehab OT Goals Patient Stated Goal: return to her home OT Goal Formulation: With patient Time For Goal Achievement: 10/15/20 Potential to Achieve Goals: Good ADL Goals Pt Will Perform Grooming: with set-up;standing;sitting Pt Will Perform Lower Body Bathing: with set-up;sit to/from stand Pt Will Perform Lower Body Dressing: with set-up;sit to/from stand Pt Will Transfer to Toilet: with set-up;ambulating;regular height toilet Pt Will Perform Toileting - Clothing Manipulation and hygiene: Independently;sit to/from stand;sitting/lateral leans Pt/caregiver will Perform Home Exercise Program: Increased strength;Both right and left upper extremity;With theraband;With Supervision;With written HEP provided  OT Frequency: Min 2X/week   Barriers to D/C:    none noted       Co-evaluation              AM-PAC OT "6 Clicks" Daily Activity     Outcome Measure Help from another person eating meals?: None Help from another person taking care of personal grooming?: None Help from another person toileting, which includes using toliet, bedpan, or urinal?: A Little Help from another  person bathing (including washing, rinsing, drying)?: A Little Help from another person to put on and taking off regular upper body clothing?: None Help from another person to put on and taking off regular lower body clothing?: A Little 6 Click Score: 21   End of Session Nurse Communication: Mobility status  Activity Tolerance: Patient tolerated treatment well Patient left: in chair;with call bell/phone within reach;with family/visitor present  OT Visit Diagnosis: Unsteadiness on feet (R26.81)                Time: [id number] OT Time Calculation (min): 22 min Charges:  OT General Charges $OT Visit: 1 Visit OT Evaluation $OT Eval Moderate Complexity: 1 Mod  10/01/2020  RP, OTR/L  Acute Rehabilitation Services  Office:  [phone]   Metta Clines 10/01/2020, 12:00 PM

## 2020-10-01 NOTE — Progress Notes (Signed)
PT Cancellation Note  Patient Details Name: [name] [name] MRN: [id number] DOB: 1925-12-06   Cancelled Treatment:    Reason Eval/Treat Not Completed: Fatigue/lethargy limiting ability to participate  Wanted to see pt in pm to assess mobility when she may be more fatigued. Pt currently sound asleep and family reports she normally takes an afternoon nap. Will attempt to see pt later today as schedule permits.    Arby Barrette, PT Pager [phone]  Rexanne Mano 10/01/2020, 2:17 PM

## 2020-10-01 NOTE — Progress Notes (Signed)
PROGRESS NOTE        PATIENT DETAILS Name: [name] [name] Age: 87 y.o. Sex: female Date of Birth: [birth year] Admit Date: 09/27/2020 Admitting Physician Norval Morton, MD PCP:O'Sullivan, Lenna Sciara, NP  Brief Narrative: Patient is a 87 y.o. female with history of HTN, HLD, COPD, chronic hypoxic respiratory failure on 2 L of oxygen (uses intermittently), chronic pain-s/p spinal stimulator in place-recent history of COVID-positive on 8/2-who presented with confusion-she was found to have multifocal pneumonia-and a salicylate level at the upper limit of normal.  Patient was subsequently admitted to the hospitalist service-see below for further details.  Significant events: 8/2>> COVID-positive 9/1>> admit for evaluation of confusion-found to have PNA/ha salicylate level in the upper limit of normal.  Significant studies: 9/1>> CTA chest: No PE, multifocal pneumonia. 9/3>> CT head: No acute intracranial abnormality.  Antimicrobial therapy: Rocephin: 9/1>> Zithromax: 9/1>>  Microbiology data: 9/1>> blood culture: No growth 9/2>> urine culture: No growth  Procedures : None  Consults: None  DVT Prophylaxis : enoxaparin (LOVENOX) injection 30 mg Start: 09/29/20 1200 Place and maintain sequential compression device Start: 09/28/20 2217   Prophylactic Lovenox   Subjective: Granddaughter at bedside (who works with Environmental education officer) patient gets confused in the evenings.  Reassurance provided-explained at length how this elderly patient would be at risk for sundowning.  Currently this morning-patient is completely awake and alert.  Continues to have a mild headache.  Assessment/Plan: Acute metabolic encephalopathy: Improved-suspect she is not far from her baseline.  Suspect encephalopathy was from pneumonia/salicylate toxicity.  Appears to have had some delirium/sundowning yesterday-expect this will continue for as long as she is hospitalized.  Granddaughter requests as  needed medications when this happens-I have added as needed Seroquel.  Community-acquired pneumonia: Continue Rocephin/Zithromax-culture data negative so far.  Has mild borderline leukopenia (appears to have mild intermittent leukopenia dating back to 2016).  Continue to monitor CBC periodically.  Plan 5 days total of Rocephin/Zithromax.  Recent COVID-19 infection: Per H&P-tested positive on 8/2-no need for isolation or any further specific treatment.  Minimally elevated D-dimer: Not hypoxic-CTA chest negative-unclear clinical significance-low suspicion for VTE given lack of hypoxemia/clinical features.  Continue prophylactic Lovenox.    AKI: Very mild-likely hemodynamically mediated-resolved.  Anion gap metabolic acidosis: Either from AKI or from salicylates.  Resolved-monitor periodically.  GERD: Continue PPI  Normocytic anemia: Stable-no evidence of blood loss-suspect this is a chronic issue.  Mild drop in hemoglobin likely to IV fluids.  Right subconjunctival hemorrhage: Supportive care  Chronic hypoxic respiratory failure-on 2 L of oxygen at home  Chronic pain syndrome-back pain/diffuse arthralgias/scoliosis: Spinal cord stimulator in place-have counseled regarding avoidance of further salicylate use.Per granddaughter-on as needed Percocet at home.  Follows with pain management-has been through numerous pain therapies including Lidoderm patches etc.  Chronic debility/deconditioning/frailty: Lives alone-son lives next door-numerous family members in/out of the house multiple times a day.  Family requesting CIR evaluation-even after explaining that she likely will not qualify given lack of supporting diagnoses.  Suspect home with home and services-but due to family's request-we will go ahead and consult CIR.   Diet: Diet Order             DIET DYS 2 Room service appropriate? Yes with Assist; Fluid consistency: Thin  Diet effective now  Code Status: Full  code   Family Communication: Grandaughter at bedside-( Angelia Cox -works at [employer] 9/5  Disposition Plan: Status is: Inpatient  Remains inpatient appropriate because:Inpatient level of care appropriate due to severity of illness  Dispo: The patient is from: Home              Anticipated d/c is to: Home              Patient currently is not medically stable to d/c.   Difficult to place patient No    Barriers to Discharge: Continue IV antibiotics-unlikely Home with home health services over the next 1-2 days.  Antimicrobial agents: Anti-infectives (From admission, onward)    Start     Dose/Rate Route Frequency Ordered Stop   09/30/20 1000  azithromycin (ZITHROMAX) tablet 500 mg        500 mg Oral Daily 09/29/20 1407 10/03/20 0959   09/29/20 1000  remdesivir 100 mg in sodium chloride 0.9 % 100 mL IVPB  Status:  Discontinued       See Hyperspace for full Linked Orders Report.   100 mg 200 mL/hr over 30 Minutes Intravenous Daily 09/28/20 0147 09/28/20 1213   09/29/20 0500  azithromycin (ZITHROMAX) 500 mg in sodium chloride 0.9 % 250 mL IVPB  Status:  Discontinued        500 mg 250 mL/hr over 60 Minutes Intravenous Every 24 hours 09/28/20 0751 09/29/20 1407   09/28/20 0215  remdesivir 100 mg in sodium chloride 0.9 % 100 mL IVPB       See Hyperspace for full Linked Orders Report.   100 mg 200 mL/hr over 30 Minutes Intravenous  Once 09/28/20 0147 09/28/20 0419   09/28/20 0200  remdesivir 100 mg in sodium chloride 0.9 % 100 mL IVPB       See Hyperspace for full Linked Orders Report.   100 mg 200 mL/hr over 30 Minutes Intravenous  Once 09/28/20 0147 09/28/20 0523   09/27/20 2200  cefTRIAXone (ROCEPHIN) 2 g in sodium chloride 0.9 % 100 mL IVPB        2 g 200 mL/hr over 30 Minutes Intravenous Every 24 hours 09/27/20 2146 10/02/20 2159   09/27/20 2200  azithromycin (ZITHROMAX) 500 mg in sodium chloride 0.9 % 250 mL IVPB  Status:  Discontinued        500 mg 250 mL/hr  over 60 Minutes Intravenous Every 24 hours 09/27/20 2146 09/28/20 0751        Time spent: 35 minutes-Greater than 50% of this time was spent in counseling, explanation of diagnosis, planning of further management, and coordination of care.  MEDICATIONS: Scheduled Meds:  vitamin C  500 mg Oral Daily   azithromycin  500 mg Oral Daily   enoxaparin (LOVENOX) injection  30 mg Subcutaneous Q24H   famotidine  20 mg Oral Daily   feeding supplement  237 mL Oral BID BM   nystatin  5 mL Oral QID   polyethylene glycol  17 g Oral Daily   sodium chloride flush  3 mL Intravenous Q12H   zinc sulfate  220 mg Oral Daily   Continuous Infusions:  cefTRIAXone (ROCEPHIN)  IV 2 g (09/30/20 2105)   PRN Meds:.acetaminophen, albuterol, guaiFENesin-dextromethorphan, hyoscyamine, ketorolac, lactulose, LORazepam, ondansetron **OR** ondansetron (ZOFRAN) IV, oxyCODONE-acetaminophen   PHYSICAL EXAM: Vital signs: Vitals:   10/01/20 0400 10/01/20 0814 10/01/20 1200 10/01/20 1244  BP: (!) 133/58 140/63  139/82  Pulse: 82 87    Resp: 19 12 14  Temp: 97.9 F (36.6 C) 98.2 F (36.8 C) 99 F (37.2 C) 99 F (37.2 C)  TempSrc: Axillary Axillary Oral Oral  SpO2: 99% 100%  100%  Weight:      Height:       Filed Weights   09/27/20 2109  Weight: 40.8 kg   Body mass index is 18.18 kg/m.   Gen Exam:Alert awake-not in any distress HEENT:atraumatic, normocephalic Chest: B/L clear to auscultation anteriorly CVS:S1S2 regular Abdomen:soft non tender, non distended Extremities:no edema Neurology: Non focal Skin: no rash   I have personally reviewed following labs and imaging studies  LABORATORY DATA: CBC: Recent Labs  Lab 09/27/20 2120 09/28/20 1256 09/29/20 0117 10/01/20 0101  WBC 5.9 2.8* 3.5* 2.3*  NEUTROABS 4.9 2.0 2.6 1.7  HGB 11.4* 9.2* 9.6* 8.4*  HCT 36.0 28.1* 30.6* 28.5*  MCV 91.1 91.8 93.6 98.3  PLT 316 214 246 195     Basic Metabolic Panel: Recent Labs  Lab 09/27/20 2120  09/28/20 1256 09/29/20 0117  NA 143 142 142  K 4.0 3.4* 4.1  CL 106 110 110  CO2 20* 21* 24  GLUCOSE 112* 81 74  BUN 25* 19 19  CREATININE 1.22* 1.02* 0.93  CALCIUM 9.2 8.0* 8.1*  MG  --  1.7 1.8  PHOS  --  3.6 3.5     GFR: Estimated Creatinine Clearance: 24.3 mL/min (by C-G formula based on SCr of 0.93 mg/dL).  Liver Function Tests: Recent Labs  Lab 09/27/20 2120 09/28/20 1256 09/29/20 0117  AST 11* 14* 14*  ALT '5 7 6  '$ ALKPHOS 102 78 89  BILITOT 0.2* 0.6 0.3  PROT 6.9 5.2* 5.4*  ALBUMIN 3.9 2.3* 2.4*    No results for input(s): LIPASE, AMYLASE in the last 168 hours. No results for input(s): AMMONIA in the last 168 hours.  Coagulation Profile: Recent Labs  Lab 09/27/20 2120 09/29/20 0117  INR 1.9* 1.9*     Cardiac Enzymes: No results for input(s): CKTOTAL, CKMB, CKMBINDEX, TROPONINI in the last 168 hours.  BNP (last 3 results) No results for input(s): PROBNP in the last 8760 hours.  Lipid Profile: No results for input(s): CHOL, HDL, LDLCALC, TRIG, CHOLHDL, LDLDIRECT in the last 72 hours.  Thyroid Function Tests: Recent Labs    09/29/20 0117  TSH 1.397     Anemia Panel: Recent Labs    09/29/20 0117  FERRITIN 63     Urine analysis:    Component Value Date/Time   COLORURINE YELLOW 09/28/2020 0738   APPEARANCEUR CLEAR 09/28/2020 0738   LABSPEC 1.040 (H) 09/28/2020 0738   PHURINE 6.0 09/28/2020 0738   GLUCOSEU NEGATIVE 09/28/2020 0738   GLUCOSEU NEGATIVE 03/22/2018 1323   HGBUR SMALL (A) 09/28/2020 0738   BILIRUBINUR NEGATIVE 09/28/2020 0738   BILIRUBINUR 1+ 02/16/2018 1234   KETONESUR 15 (A) 09/28/2020 0738   PROTEINUR TRACE (A) 09/28/2020 0738   UROBILINOGEN 0.2 03/22/2018 1323   NITRITE NEGATIVE 09/28/2020 0738   LEUKOCYTESUR NEGATIVE 09/28/2020 0738    Sepsis Labs: Lactic Acid, Venous    Component Value Date/Time   LATICACIDVEN 1.0 09/27/2020 2215    MICROBIOLOGY: Recent Results (from the past 240 hour(s))  Blood  Culture (routine x 2)     Status: None (Preliminary result)   Collection Time: 09/27/20 10:15 PM   Specimen: BLOOD  Result Value Ref Range Status   Specimen Description   Final    BLOOD BOTTLES DRAWN AEROBIC AND ANAEROBIC Performed at [facility], [address] [place],  [place] [address]    Special Requests   Final    Blood Culture adequate volume BLOOD LEFT ARM Performed at [facility], [address] [place], [place] [address]    Culture   Final    NO GROWTH 3 DAYS Performed at [facility], [place] [address], [place], [place] [address]    Report Status PENDING  Incomplete  Blood Culture (routine x 2)     Status: None (Preliminary result)   Collection Time: 09/27/20 10:16 PM   Specimen: BLOOD  Result Value Ref Range Status   Specimen Description   Final    BLOOD BOTTLES DRAWN AEROBIC AND ANAEROBIC Performed at [facility], [address], [place], [place] [address]    Special Requests   Final    Blood Culture adequate volume BLOOD RIGHT ARM Performed at [facility], [address] [place], [place] [address]    Culture   Final    NO GROWTH 3 DAYS Performed at [facility], [place] [address] [place], [place] [address]    Report Status PENDING  Incomplete  Resp Panel by RT-PCR (Flu A&B, Covid) Nasopharyngeal Swab     Status: Abnormal   Collection Time: 09/27/20 10:39 PM   Specimen: Nasopharyngeal Swab; Nasopharyngeal(NP) swabs in vial transport medium  Result Value Ref Range Status   SARS Coronavirus 2 by RT PCR POSITIVE (A) NEGATIVE Final    Comment: RESULT CALLED TO, READ BACK BY AND VERIFIED WITH: L. VENEGAS RN 09/27/20 AT 2338 CH (NOTE) SARS-CoV-2 target nucleic acids are DETECTED.  The SARS-CoV-2 RNA is generally detectable in upper respiratory specimens during the acute phase of infection. Positive results are indicative of the presence of the identified virus, but do not  rule out bacterial infection or co-infection with other pathogens not detected by the test. Clinical correlation with patient history and other diagnostic information is necessary to determine patient infection status. The expected result is Negative.  Fact Sheet for Patients: EntrepreneurPulse.com.au  Fact Sheet for Healthcare Providers: IncredibleEmployment.be  This test is not yet approved or cleared by the [place] FDA and  has been authorized for detection and/or diagnosis of SARS-CoV-2 by FDA under an Emergency Use Authorization (EUA).  This EUA will remain in effect (meaning this test can be u sed) for the duration of  the COVID-19 declaration under Section 564(b)(1) of the Act, 21 U.S.C. section 360bbb-3(b)(1), unless the authorization is terminated or revoked sooner.     Influenza A by PCR NEGATIVE NEGATIVE Final   Influenza B by PCR NEGATIVE NEGATIVE Final    Comment: (NOTE) The Xpert Xpress SARS-CoV-2/FLU/RSV plus assay is intended as an aid in the diagnosis of influenza from Nasopharyngeal swab specimens and should not be used as a sole basis for treatment. Nasal washings and aspirates are unacceptable for Xpert Xpress SARS-CoV-2/FLU/RSV testing.  Fact Sheet for Patients: EntrepreneurPulse.com.au  Fact Sheet for Healthcare Providers: IncredibleEmployment.be  This test is not yet approved or cleared by the [place] FDA and has been authorized for detection and/or diagnosis of SARS-CoV-2 by FDA under an Emergency Use Authorization (EUA). This EUA will remain in effect (meaning this test can be used) for the duration of the COVID-19 declaration under Section 564(b)(1) of the Act, 21 U.S.C. section 360bbb-3(b)(1), unless the authorization is terminated or revoked.  Performed at [facility], [address], [place],  [address]   Urine Culture     Status:  None   Collection Time: 09/28/20  7:39 AM   Specimen: Urine, Clean Catch  Result Value Ref Range Status   Specimen Description   Final    URINE, CLEAN CATCH Performed at [facility], [address] [place], [place] [address]    Special Requests   Final    NONE Performed at [facility], [address], [place], [place] [address]    Culture   Final    NO GROWTH Performed at [facility], [place] [address], [place], [place] [address]    Report Status 09/29/2020 FINAL  Final    RADIOLOGY STUDIES/RESULTS: CT HEAD WO CONTRAST (5MM)  Result Date: 09/29/2020 CLINICAL DATA:  Headache EXAM: CT HEAD WITHOUT CONTRAST TECHNIQUE: Contiguous axial images were obtained from the base of the skull through the vertex without intravenous contrast. COMPARISON:  Head CT 04/27/2017 FINDINGS: Brain: There is no mass, hemorrhage or extra-axial collection. There is generalized atrophy without lobar predilection. Hypodensity of the white matter is most commonly associated with chronic microvascular disease. Vascular: No abnormal hyperdensity of the major intracranial arteries or dural venous sinuses. No intracranial atherosclerosis. Skull: The visualized skull base, calvarium and extracranial soft tissues are normal. Sinuses/Orbits: No fluid levels or advanced mucosal thickening of the visualized paranasal sinuses. No mastoid or middle ear effusion. The orbits are normal. IMPRESSION: Generalized atrophy and chronic microvascular ischemia without acute intracranial abnormality. Electronically Signed   By: Ulyses Jarred M.D.   On: 09/29/2020 23:01     LOS: 3 days   Oren Binet, MD  [facility]    To contact the attending provider between 7A-7P or the covering provider during after hours 7P-7A, please log into the web site [email or web address] and access using universal [place] password for that web site. If you do not have the password, please call the hospital  operator.  10/01/2020, 2:13 PM

## 2020-10-02 ENCOUNTER — Other Ambulatory Visit (HOSPITAL_COMMUNITY): Payer: Self-pay

## 2020-10-02 LAB — CBC WITH DIFFERENTIAL/PLATELET
Abs Immature Granulocytes: 0.08 10*3/uL — ABNORMAL HIGH (ref 0.00–0.07)
Basophils Absolute: 0 10*3/uL (ref 0.0–0.1)
Basophils Relative: 1 %
Eosinophils Absolute: 0.1 10*3/uL (ref 0.0–0.5)
Eosinophils Relative: 5 %
HCT: 31.3 % — ABNORMAL LOW (ref 36.0–46.0)
Hemoglobin: 9.4 g/dL — ABNORMAL LOW (ref 12.0–15.0)
Immature Granulocytes: 4 %
Lymphocytes Relative: 24 %
Lymphs Abs: 0.5 10*3/uL — ABNORMAL LOW (ref 0.7–4.0)
MCH: 29.3 pg (ref 26.0–34.0)
MCHC: 30 g/dL (ref 30.0–36.0)
MCV: 97.5 fL (ref 80.0–100.0)
Monocytes Absolute: 0.5 10*3/uL (ref 0.1–1.0)
Monocytes Relative: 21 %
Neutro Abs: 1 10*3/uL — ABNORMAL LOW (ref 1.7–7.7)
Neutrophils Relative %: 45 %
Platelets: 188 10*3/uL (ref 150–400)
RBC: 3.21 MIL/uL — ABNORMAL LOW (ref 3.87–5.11)
RDW: 15 % (ref 11.5–15.5)
WBC: 2.1 10*3/uL — ABNORMAL LOW (ref 4.0–10.5)
nRBC: 0 % (ref 0.0–0.2)

## 2020-10-02 LAB — COMPREHENSIVE METABOLIC PANEL
ALT: 6 U/L (ref 0–44)
AST: 15 U/L (ref 15–41)
Albumin: 2.2 g/dL — ABNORMAL LOW (ref 3.5–5.0)
Alkaline Phosphatase: 67 U/L (ref 38–126)
Anion gap: 4 — ABNORMAL LOW (ref 5–15)
BUN: 12 mg/dL (ref 8–23)
CO2: 33 mmol/L — ABNORMAL HIGH (ref 22–32)
Calcium: 8.3 mg/dL — ABNORMAL LOW (ref 8.9–10.3)
Chloride: 104 mmol/L (ref 98–111)
Creatinine, Ser: 0.77 mg/dL (ref 0.44–1.00)
GFR, Estimated: >60 mL/min (ref >60)
Glucose, Bld: 93 mg/dL (ref 70–99)
Potassium: 3.6 mmol/L (ref 3.5–5.1)
Sodium: 141 mmol/L (ref 135–145)
Total Bilirubin: 0.2 mg/dL — ABNORMAL LOW (ref 0.3–1.2)
Total Protein: 4.9 g/dL — ABNORMAL LOW (ref 6.5–8.1)

## 2020-10-02 LAB — PROTIME-INR
INR: 1 (ref 0.8–1.2)
Prothrombin Time: 13.3 seconds (ref 11.4–15.2)

## 2020-10-02 MED ORDER — NYSTATIN 100000 UNIT/ML MT SUSP: 5.0000 mL | Freq: Four times a day (QID) | OROMUCOSAL | 0 refills | Status: AC

## 2020-10-02 MED ORDER — LACTULOSE 10 GM/15ML PO SOLN: 30.0000 g | Freq: Two times a day (BID) | ORAL | 0 refills | Status: AC | PRN

## 2020-10-02 MED ORDER — ENSURE ENLIVE PO LIQD: 237.0000 mL | Freq: Two times a day (BID) | ORAL | 0 refills | Status: AC

## 2020-10-02 MED ORDER — QUETIAPINE FUMARATE 25 MG PO TABS: 25.0000 mg | ORAL_TABLET | Freq: Every evening | ORAL | 0 refills | Status: DC | PRN

## 2020-10-02 MED ORDER — MAGIC MOUTHWASH: 5.0000 mL | Freq: Four times a day (QID) | ORAL | 0 refills | Status: DC | PRN

## 2020-10-02 NOTE — Progress Notes (Signed)
Physical Therapy Treatment Patient Details Name: [name] [name] MRN: [id number] DOB: [birth year] Today's Date: 10/02/2020    History of Present Illness 87 y.o. female presented 09/27/20 after being noted to be altered by family. Was +COVID 8/2 and on admission. +pneumonia and elevated salicylate level; CTA chest negative for PE  PMH significant of hypertension, hyperlipidemia, CHF, COPD intermittently using 2 L of nasal cannula oxygen at night, chronic pain s/p spinal stimulator, and PUD/GERD    PT Comments    Patient mobilizing well--steady on her feet and able to maneuver RW without assist (including through tight spaces and turning). Cuing needed for sequencing with hand placement for safety with sit to stand. Repeated x 6 with pt not needing cues reps 3-6.     Follow Up Recommendations  Home health PT (recommend HHPT for further DME instruction)     Equipment Recommendations  None recommended by PT    Recommendations for Other Services       Precautions / Restrictions Precautions Precautions: Fall Precaution Comments: denies any previous falls    Mobility  Bed Mobility Overal bed mobility: Modified Independent             General bed mobility comments: HOB slightly elevated supine > sit    Transfers Overall transfer level: Needs assistance Equipment used: 1 person hand held assist;Rolling walker (2 wheeled) Transfers: Sit to/from Stand Sit to Stand: Min guard         General transfer comment: vc for hand placment (pt does not usually use a RW); no physical assist needed x 6 reps  Ambulation/Gait Ambulation/Gait assistance: Min guard Gait Distance (Feet): 90 Feet Assistive device: Rolling walker (2 wheeled) Gait Pattern/deviations: Step-through pattern;Decreased stride length   Gait velocity interpretation: 1.31 - 2.62 ft/sec, indicative of limited community ambulator General Gait Details: pt able to maneuver RW without assist (around obstacles in room; turn  180 degrees)   Stairs             Wheelchair Mobility    Modified Rankin (Stroke Patients Only)       Balance Overall balance assessment: Needs assistance Sitting-balance support: No upper extremity supported;Feet unsupported Sitting balance-Leahy Scale: Good     Standing balance support: No upper extremity supported Standing balance-Leahy Scale: Fair                              Cognition Arousal/Alertness: Awake/alert Behavior During Therapy: WFL for tasks assessed/performed Overall Cognitive Status: Within Functional Limits for tasks assessed                                        Exercises General Exercises - Lower Extremity Long Arc Quad: AROM;Both;20 reps Hip Flexion/Marching: AROM;Both;20 reps;Seated    General Comments General comments (skin integrity, edema, etc.): Family member present      Pertinent Vitals/Pain Pain Assessment: No/denies pain    Home Living                      Prior Function            PT Goals (current goals can now be found in the care plan section) Acute Rehab PT Goals Patient Stated Goal: return to her home Time For Goal Achievement: 10/14/20 Potential to Achieve Goals: Good Progress towards PT goals: Progressing toward goals    Frequency  Min 3X/week      PT Plan Current plan remains appropriate    Co-evaluation              AM-PAC PT "6 Clicks" Mobility   Outcome Measure  Help needed turning from your back to your side while in a flat bed without using bedrails?: None Help needed moving from lying on your back to sitting on the side of a flat bed without using bedrails?: None Help needed moving to and from a bed to a chair (including a wheelchair)?: A Little Help needed standing up from a chair using your arms (e.g., wheelchair or bedside chair)?: A Little Help needed to walk in hospital room?: A Little Help needed climbing 3-5 steps with a railing? : A  Little 6 Click Score: 20    End of Session Equipment Utilized During Treatment: Gait belt Activity Tolerance: Patient tolerated treatment well Patient left: with call bell/phone within reach;with family/visitor present;in chair Nurse Communication: Mobility status PT Visit Diagnosis: Unsteadiness on feet (R26.81)     Time: [id number] PT Time Calculation (min) (ACUTE ONLY): 20 min  Charges:  $Gait Training: 8-22 mins                      Arby Barrette, PT Pager [phone]    Rexanne Mano 10/02/2020, 10:21 AM

## 2020-10-02 NOTE — Discharge Summary (Addendum)
PATIENT DETAILS Name: [name] [name] Age: 87 y.o. Sex: female Date of Birth: [birth year] MRN: [id number]. Admitting Physician: Norval Morton, MD PCP:O'Sullivan, Lenna Sciara, NP  Admit Date: 09/27/2020 Discharge date: 10/02/2020  Recommendations for Outpatient Follow-up:  Follow up with PCP in 1-2 weeks Please obtain CMP/CBC in one week Ensure outpatient follow-up with hematology-to assess for persistent leukopenia.  Admitted From:  Home  Disposition: Home with home health services (insurance denied CIR-family does not want SNF)   Home Health: Yes  Equipment/Devices: None  Discharge Condition: Stable  CODE STATUS: FULL CODE  Diet recommendation:  Diet Order             Diet - low sodium heart healthy           DIET DYS 2 Room service appropriate? Yes with Assist; Fluid consistency: Thin  Diet effective now                    Brief Narrative: Patient is a 87 y.o. female with history of HTN, HLD, COPD, chronic hypoxic respiratory failure on 2 L of oxygen (uses intermittently), chronic pain-s/p spinal stimulator in place-recent history of COVID-positive on 8/2-who presented with confusion-she was found to have multifocal pneumonia-and a salicylate level at the upper limit of normal.  Patient was subsequently admitted to the hospitalist service-see below for further details.   Significant events: 8/2>> COVID-positive 9/1>> admit for evaluation of confusion-found to have PNA/ha salicylate level in the upper limit of normal.   Significant studies: 9/1>> CTA chest: No PE, multifocal pneumonia. 9/3>> CT head: No acute intracranial abnormality.   Antimicrobial therapy: Rocephin: 9/1>>9/6 Zithromax: 9/1>>9/6   Microbiology data: 9/1>> blood culture: No growth 9/2>> urine culture: No growth   Procedures : None   Consults: None    Brief Hospital Course: Acute metabolic encephalopathy: Improved-suspect she is not far from her baseline.  Suspect encephalopathy  was from pneumonia/salicylate toxicity-she is on chronic narcotics at baseline which probably was contributing as well.  Some mild delirium/sundowning during this hospital stay-family provided reassurance.  This morning completely awake and alert-Per RN no major issues overnight.  Granddaughter yesterday-wanted as needed medications if she continues to have delirium at home-we will provide a few day supply of Seroquel.   Community-acquired pneumonia: Stable-afebrile-not hypoxic-on room air-treated with Rocephin/Zithromax x5 days.  Does not need any further antimicrobial therapy on discharge.    Mild leukopenia: Has been ongoing periodically since 2016-we will refer to hematology postdischarge.   Coagulopathy: INR mildly elevated on admission-has resolved spontaneously.  Unclear etiology.  Stable for outpatient follow-up.   Recent COVID-19 infection: Per H&P-tested positive on 8/2-no need for isolation or any further specific treatment.   Minimally elevated D-dimer: Not hypoxic-CTA chest negative-unclear clinical significance-low suspicion for VTE given lack of hypoxemia/clinical features.  Was on prophylactic Lovenox during this hospitalization.     AKI: Very mild-likely hemodynamically mediated-resolved.   Anion gap metabolic acidosis: Either from AKI or from salicylates.  Resolved-monitor periodically.   GERD: Continue PPI   Normocytic anemia: Stable-no evidence of blood loss-suspect this is a chronic issue.  Mild drop in hemoglobin likely to IV fluids.   Right subconjunctival hemorrhage: Supportive care   Chronic hypoxic respiratory failure-on 2 L of oxygen at home   Chronic pain syndrome-back pain/diffuse arthralgias/scoliosis: Spinal cord stimulator in place-have counseled regarding avoidance of further salicylate use (in front of numerous family members over the past few days).Per granddaughter-on as needed Percocet at home.  Follows with pain management-has been  through numerous pain  therapies including Lidoderm patches etc.   Chronic debility/deconditioning/frailty: Lives alone-son lives next door-numerous family members in/out of the house multiple times a day.  At family's request-CIR evaluation done-insurance has denied CIR admission-family not keen on taking patient to SNF-hence being discharged home with home health services.  Suspect some weakness than her baseline is due to acute illness related debility/deconditioning.  Nutrition Status: Nutrition Problem: Severe Malnutrition Etiology: chronic illness (COPD) Signs/Symptoms: severe fat depletion, severe muscle depletion Interventions: Ensure Enlive (each supplement provides 350kcal and 20 grams of protein), MVI    Procedures None  Discharge Diagnoses:  Active Problems:   Normocytic anemia   Acute on chronic respiratory failure with hypoxia (HCC)   Pneumonia   Acute metabolic encephalopathy   Metabolic acidosis with increased anion gap and accumulation of organic acids   Coagulopathy   Coagulopathy ([place])   Discharge Instructions:  Activity:  As tolerated with Full fall precautions use walker/cane & assistance as needed Discharge Instructions     Ambulatory referral to Hematology / Oncology   Complete by: As directed    Call MD for:  difficulty breathing, headache or visual disturbances   Complete by: As directed    Diet - low sodium heart healthy   Complete by: As directed    Discharge instructions   Complete by: As directed    Follow with Primary MD  Debbrah Alar, NP in 1-2 weeks  Please get a complete blood count and chemistry panel checked by your Primary MD at your next visit, and again as instructed by your Primary MD.  Get Medicines reviewed and adjusted: Please take all your medications with you for your next visit with your Primary MD  Laboratory/radiological data: Please request your Primary MD to go over all hospital tests and procedure/radiological results at the follow up,  please ask your Primary MD to get all Hospital records sent to his/her office.  In some cases, they will be blood work, cultures and biopsy results pending at the time of your discharge. Please request that your primary care M.D. follows up on these results.  Also Note the following: If you experience worsening of your admission symptoms, develop shortness of breath, life threatening emergency, suicidal or homicidal thoughts you must seek medical attention immediately by calling 911 or calling your MD immediately  if symptoms less severe.  You must read complete instructions/literature along with all the possible adverse reactions/side effects for all the Medicines you take and that have been prescribed to you. Take any new Medicines after you have completely understood and accpet all the possible adverse reactions/side effects.   Do not drive when taking Pain medications or sleeping medications (Benzodaizepines)  Do not take more than prescribed Pain, Sleep and Anxiety Medications. It is not advisable to combine anxiety,sleep and pain medications without talking with your primary care practitioner  Special Instructions: If you have smoked or chewed Tobacco  in the last 2 yrs please stop smoking, stop any regular Alcohol  and or any Recreational drug use.  Wear Seat belts while driving.  Please note: You were cared for by a hospitalist during your hospital stay. Once you are discharged, your primary care physician will handle any further medical issues. Please note that NO REFILLS for any discharge medications will be authorized once you are discharged, as it is imperative that you return to your primary care physician (or establish a relationship with a primary care physician if you do not have one) for your  post hospital discharge needs so that they can reassess your need for medications and monitor your lab values.   Outpatient referral to hematology (blood doctor) sent-you should receive a  call.  If you do not-please give them a call.  Referral was sent because you have had a low white blood cell count for the past several years.   Increase activity slowly   Complete by: As directed       Allergies as of 10/02/2020       Reactions   Cefdinir Diarrhea   Codeine Diarrhea, Nausea And Vomiting   REACTION: n/v/d, HA   Influenza Vaccines    Stomach pain, headache   Morphine Diarrhea, Nausea And Vomiting   REACTION: n/v/d, HA   Sulfa Antibiotics Nausea Only   Sick    Zoledronic Acid Swelling   REACTION: Severe edema   Sulfasalazine Nausea Only   Sick         Medication List     STOP taking these medications    GOODY HEADACHE PO       TAKE these medications    feeding supplement Liqd Take 237 mLs by mouth 2 (two) times daily between meals.   hyoscyamine 0.375 MG 12 hr tablet Commonly known as: LEVBID Take 0.375 mg by mouth 2 (two) times daily as needed. Stomach pain   lactulose 10 GM/15ML solution Commonly known as: CHRONULAC Take 45 mLs (30 g total) by mouth 2 (two) times daily as needed for moderate constipation.   magic mouthwash Soln Take 5 mLs by mouth 4 (four) times daily as needed for mouth pain.   multivitamin with minerals tablet Take 1 tablet by mouth daily.   nystatin 100000 UNIT/ML suspension Commonly known as: MYCOSTATIN Take 5 mLs (500,000 Units total) by mouth 4 (four) times daily for 2 days.   omeprazole 40 MG capsule Commonly known as: PRILOSEC Take 1 capsule (40 mg total) by mouth 2 (two) times daily.   oxyCODONE-acetaminophen 7.5-325 MG tablet Commonly known as: PERCOCET Take 1 tablet by mouth every 8 (eight) hours as needed for severe pain.   OXYGEN Inhale 2 L into the lungs as needed.   QUEtiapine 25 MG tablet Commonly known as: SEROQUEL Take 1 tablet (25 mg total) by mouth at bedtime as needed (agitation/confusion).   Shingrix injection Generic drug: Zoster Vaccine Adjuvanted Inject 0.'5mg'$  IM now and again in 2-6  months.               Durable Medical Equipment  (From admission, onward)           Start     Ordered   10/02/20 1124  For home use only DME oxygen  Once       Question Answer Comment  Length of Need 6 Months   Mode or (Route) Nasal cannula   Liters per Minute 2   Oxygen delivery system Gas      10/02/20 1123            Follow-up Information     Debbrah Alar, NP. Schedule an appointment as soon as possible for a visit in 1 week(s).   Specialty: Internal Medicine Contact information: [place] [address] [facility] Follow up.   Specialty: Oncology Why: Office will call with date/time, If you dont hear from them,please give them a call Contact information: [place] [id number] [place] [phone]  [phone]               Allergies  Allergen Reactions   Cefdinir Diarrhea   Codeine Diarrhea and Nausea And Vomiting    REACTION: n/v/d, HA   Influenza Vaccines     Stomach pain, headache   Morphine Diarrhea and Nausea And Vomiting    REACTION: n/v/d, HA   Sulfa Antibiotics Nausea Only    Sick     Zoledronic Acid Swelling    REACTION: Severe edema   Sulfasalazine Nausea Only    Sick       Consultations: None   Other Procedures/Studies: CT HEAD WO CONTRAST (5MM)  Result Date: 09/29/2020 CLINICAL DATA:  Headache EXAM: CT HEAD WITHOUT CONTRAST TECHNIQUE: Contiguous axial images were obtained from the base of the skull through the vertex without intravenous contrast. COMPARISON:  Head CT 04/27/2017 FINDINGS: Brain: There is no mass, hemorrhage or extra-axial collection. There is generalized atrophy without lobar predilection. Hypodensity of the white matter is most commonly associated with chronic microvascular disease. Vascular: No abnormal hyperdensity of the major intracranial arteries or dural venous sinuses. No  intracranial atherosclerosis. Skull: The visualized skull base, calvarium and extracranial soft tissues are normal. Sinuses/Orbits: No fluid levels or advanced mucosal thickening of the visualized paranasal sinuses. No mastoid or middle ear effusion. The orbits are normal. IMPRESSION: Generalized atrophy and chronic microvascular ischemia without acute intracranial abnormality. Electronically Signed   By: Ulyses Jarred M.D.   On: 09/29/2020 23:01   CT Angio Chest PE W/Cm &/Or Wo Cm  Result Date: 09/27/2020 CLINICAL DATA:  Concern for pulmonary embolism. EXAM: CT ANGIOGRAPHY CHEST WITH CONTRAST TECHNIQUE: Multidetector CT imaging of the chest was performed using the standard protocol during bolus administration of intravenous contrast. Multiplanar CT image reconstructions and MIPs were obtained to evaluate the vascular anatomy. CONTRAST:  83m OMNIPAQUE IOHEXOL 350 MG/ML SOLN COMPARISON:  Chest CT dated 03/28/2014 and radiograph dated 09/27/2020. FINDINGS: Cardiovascular: There is no cardiomegaly or pericardial effusion. There is coronary vascular calcification. Moderate atherosclerotic calcification of the thoracic aorta. The aorta is tortuous. No aneurysmal dilatation or dissection. No CT evidence of pulmonary embolism. Mediastinum/Nodes: No hilar or mediastinal adenopathy. Mild circumferential thickening of the esophagus. Mediastinal fluid collection. Lungs/Pleura: Scattered bilateral nodular densities with somewhat tree-in-bud appearance most consistent with multifocal pneumonia, likely viral or atypical in etiology. No lobar consolidation, pleural effusion, or pneumothorax. The central airways are patent. Upper Abdomen: Pneumobilia. Musculoskeletal: Osteopenia with scoliosis and degenerative changes of the spine. No acute osseous pathology. Thoracic spine stimulator. Review of the MIP images confirms the above findings. IMPRESSION: 1. No CT evidence of pulmonary embolism. 2. Multifocal pneumonia, likely viral  or atypical in etiology. Clinical correlation and follow-up to resolution recommended. 3. Aortic Atherosclerosis (ICD10-I70.0). Electronically Signed   By: AAnner CreteM.D.   On: 09/27/2020 22:37   DG Chest Portable 1 View  Result Date: 09/27/2020 CLINICAL DATA:  Shortness of breath EXAM: PORTABLE CHEST 1 VIEW COMPARISON:  11/17/2019 FINDINGS: Stable heart size. Atherosclerotic calcification of the aortic knob. Chronically hyperinflated lungs with coarsened interstitial markings. No focal airspace consolidation, pleural effusion, or pneumothorax. Scoliotic spinal curvature. Thoracic spinal stimulator leads are present. IMPRESSION: No active disease. Electronically Signed   By: NDavina PokeD.O.   On: 09/27/2020 21:48     TODAY-DAY OF DISCHARGE:  Subjective:   JRima Deschnertoday has no headache,no chest abdominal pain,no new weakness tingling or numbness, feels much better wants to go home today.   Objective:  Blood pressure 93/75, pulse 80, temperature 98.4 F (36.9 C), temperature source Oral, resp. rate 16, height '4\' 11"'$  (1.499 m), weight 40.8 kg, SpO2 98 %.  Intake/Output Summary (Last 24 hours) at 10/02/2020 1337 Last data filed at 10/01/2020 2359 Gross per 24 hour  Intake 0 ml  Output --  Net 0 ml   Filed Weights   09/27/20 2109  Weight: 40.8 kg    Exam: Awake Alert, Oriented *3, No new F.N deficits, Normal affect Welch.AT,PERRAL Supple Neck,No JVD, No cervical lymphadenopathy appriciated.  Symmetrical Chest wall movement, Good air movement bilaterally, CTAB RRR,No Gallops,Rubs or new Murmurs, No Parasternal Heave +ve B.Sounds, Abd Soft, Non tender, No organomegaly appriciated, No rebound -guarding or rigidity. No Cyanosis, Clubbing or edema, No new Rash or bruise   PERTINENT RADIOLOGIC STUDIES: No results found.   PERTINENT LAB RESULTS: CBC: Recent Labs    10/01/20 0101 10/02/20 0241  WBC 2.3* 2.1*  HGB 8.4* 9.4*  HCT 28.5* 31.3*  PLT 195 188    CMET CMP     Component Value Date/Time   NA 141 10/02/2020 0241   K 3.6 10/02/2020 0241   CL 104 10/02/2020 0241   CO2 33 (H) 10/02/2020 0241   GLUCOSE 93 10/02/2020 0241   BUN 12 10/02/2020 0241   CREATININE 0.77 10/02/2020 0241   CREATININE 0.99 (H) 11/25/2019 1506   CALCIUM 8.3 (L) 10/02/2020 0241   PROT 4.9 (L) 10/02/2020 0241   ALBUMIN 2.2 (L) 10/02/2020 0241   AST 15 10/02/2020 0241   ALT 6 10/02/2020 0241   ALKPHOS 67 10/02/2020 0241   BILITOT 0.2 (L) 10/02/2020 0241   GFRNONAA >60 10/02/2020 0241   GFRNONAA 57 (L) 04/27/2017 1446   GFRAA >60 05/07/2018 1315   GFRAA 67 04/27/2017 1446    GFR Estimated Creatinine Clearance: 28.3 mL/min (by C-G formula based on SCr of 0.77 mg/dL). No results for input(s): LIPASE, AMYLASE in the last 72 hours. No results for input(s): CKTOTAL, CKMB, CKMBINDEX, TROPONINI in the last 72 hours. Invalid input(s): POCBNP Recent Labs    09/30/20 0937  DDIMER 2.06*   No results for input(s): HGBA1C in the last 72 hours. No results for input(s): CHOL, HDL, LDLCALC, TRIG, CHOLHDL, LDLDIRECT in the last 72 hours. No results for input(s): TSH, T4TOTAL, T3FREE, THYROIDAB in the last 72 hours.  Invalid input(s): FREET3 No results for input(s): VITAMINB12, FOLATE, FERRITIN, TIBC, IRON, RETICCTPCT in the last 72 hours. Coags: Recent Labs    10/02/20 0241  INR 1.0   Microbiology: Recent Results (from the past 240 hour(s))  Blood Culture (routine x 2)     Status: None (Preliminary result)   Collection Time: 09/27/20 10:15 PM   Specimen: BLOOD  Result Value Ref Range Status   Specimen Description   Final    BLOOD BOTTLES DRAWN AEROBIC AND ANAEROBIC Performed at [facility], [address] [place], [place] [address]    Special Requests   Final    Blood Culture adequate volume BLOOD LEFT ARM Performed at [facility], [address] [place], [place] [address]    Culture   Final    NO GROWTH  4 DAYS Performed at [facility], [place] [address], [place], [place] [address]    Report Status PENDING  Incomplete  Blood Culture (routine x 2)     Status: None (Preliminary result)   Collection Time: 09/27/20 10:16 PM   Specimen: BLOOD  Result Value Ref Range Status   Specimen Description  Final    BLOOD BOTTLES DRAWN AEROBIC AND ANAEROBIC Performed at [facility], [address] [place], [place] [address]    Special Requests   Final    Blood Culture adequate volume BLOOD RIGHT ARM Performed at [facility], [address] [place], [place] [address]    Culture   Final    NO GROWTH 4 DAYS Performed at [facility], [place] [address] [place], [place] [address]    Report Status PENDING  Incomplete  Resp Panel by RT-PCR (Flu A&B, Covid) Nasopharyngeal Swab     Status: Abnormal   Collection Time: 09/27/20 10:39 PM   Specimen: Nasopharyngeal Swab; Nasopharyngeal(NP) swabs in vial transport medium  Result Value Ref Range Status   SARS Coronavirus 2 by RT PCR POSITIVE (A) NEGATIVE Final    Comment: RESULT CALLED TO, READ BACK BY AND VERIFIED WITH: L. VENEGAS RN 09/27/20 AT 2338 CH (NOTE) SARS-CoV-2 target nucleic acids are DETECTED.  The SARS-CoV-2 RNA is generally detectable in upper respiratory specimens during the acute phase of infection. Positive results are indicative of the presence of the identified virus, but do not rule out bacterial infection or co-infection with other pathogens not detected by the test. Clinical correlation with patient history and other diagnostic information is necessary to determine patient infection status. The expected result is Negative.  Fact Sheet for Patients: EntrepreneurPulse.com.au  Fact Sheet for Healthcare Providers: IncredibleEmployment.be  This test is not yet approved or cleared by the [place] FDA and  has been authorized for detection and/or  diagnosis of SARS-CoV-2 by FDA under an Emergency Use Authorization (EUA).  This EUA will remain in effect (meaning this test can be u sed) for the duration of  the COVID-19 declaration under Section 564(b)(1) of the Act, 21 U.S.C. section 360bbb-3(b)(1), unless the authorization is terminated or revoked sooner.     Influenza A by PCR NEGATIVE NEGATIVE Final   Influenza B by PCR NEGATIVE NEGATIVE Final    Comment: (NOTE) The Xpert Xpress SARS-CoV-2/FLU/RSV plus assay is intended as an aid in the diagnosis of influenza from Nasopharyngeal swab specimens and should not be used as a sole basis for treatment. Nasal washings and aspirates are unacceptable for Xpert Xpress SARS-CoV-2/FLU/RSV testing.  Fact Sheet for Patients: EntrepreneurPulse.com.au  Fact Sheet for Healthcare Providers: IncredibleEmployment.be  This test is not yet approved or cleared by the [place] FDA and has been authorized for detection and/or diagnosis of SARS-CoV-2 by FDA under an Emergency Use Authorization (EUA). This EUA will remain in effect (meaning this test can be used) for the duration of the COVID-19 declaration under Section 564(b)(1) of the Act, 21 U.S.C. section 360bbb-3(b)(1), unless the authorization is terminated or revoked.  Performed at [facility], [address] [place], [place] [address]   Urine Culture     Status: None   Collection Time: 09/28/20  7:39 AM   Specimen: Urine, Clean Catch  Result Value Ref Range Status   Specimen Description   Final    URINE, CLEAN CATCH Performed at [facility], [address] [place], [place] [address]    Special Requests   Final    NONE Performed at [facility], [address], [place], [place] [address]    Culture   Final    NO GROWTH Performed at [facility], [place] [address], [place], [place] [address]    Report Status 09/29/2020  FINAL  Final    FURTHER DISCHARGE INSTRUCTIONS:  Get Medicines reviewed and adjusted: Please take all your medications with you for your next visit with your Primary MD  Laboratory/radiological data: Please request your Primary MD to go over all hospital tests and procedure/radiological results at the follow up, please ask your Primary MD to get all Hospital records sent to his/her office.  In some cases, they will be blood work, cultures and biopsy results pending at the time of your discharge. Please request that your primary care M.D. goes through all the records of your hospital data and follows up on these results.  Also Note the following: If you experience worsening of your admission symptoms, develop shortness of breath, life threatening emergency, suicidal or homicidal thoughts you must seek medical attention immediately by calling 911 or calling your MD immediately  if symptoms less severe.  You must read complete instructions/literature along with all the possible adverse reactions/side effects for all the Medicines you take and that have been prescribed to you. Take any new Medicines after you have completely understood and accpet all the possible adverse reactions/side effects.   Do not drive when taking Pain medications or sleeping medications (Benzodaizepines)  Do not take more than prescribed Pain, Sleep and Anxiety Medications. It is not advisable to combine anxiety,sleep and pain medications without talking with your primary care practitioner  Special Instructions: If you have smoked or chewed Tobacco  in the last 2 yrs please stop smoking, stop any regular Alcohol  and or any Recreational drug use.  Wear Seat belts while driving.  Please note: You were cared for by a hospitalist during your hospital stay. Once you are discharged, your primary care physician will handle any further medical issues. Please note that NO REFILLS for any discharge medications will be  authorized once you are discharged, as it is imperative that you return to your primary care physician (or establish a relationship with a primary care physician if you do not have one) for your post hospital discharge needs so that they can reassess your need for medications and monitor your lab values.  Total Time spent coordinating discharge including counseling, education and face to face time equals 35 minutes.  SignedOren Binet 10/02/2020 1:37 PM

## 2020-10-02 NOTE — Progress Notes (Signed)
Inpatient Rehab Admissions Coordinator:   Following for my colleague, Danne Baxter, this week.  We are waiting to hear back from insurance regarding prior auth for CIR.    Shann Medal, PT, DPT Admissions Coordinator [phone] 10/02/20  10:38 AM

## 2020-10-02 NOTE — Plan of Care (Signed)

## 2020-10-02 NOTE — Progress Notes (Signed)
   10/02/20 1403  AVS Discharge Documentation  AVS Discharge Instructions Including Medications Provided to patient/caregiver;Placed in discharge packet for receiving facility  Name of Person Receiving AVS Discharge Instructions Including Medications [name] [name]  Name of Clinician That Reviewed AVS Discharge Instructions Including Medications Venida Jarvis, RN

## 2020-10-02 NOTE — Care Management Important Message (Signed)
Important Message  Patient Details  Name: [name] [name] MRN: [id number] Date of Birth: 1925/06/06   Medicare Important Message Given:  Yes     [place] [address] 10/02/2020, 3:41 PM

## 2020-10-02 NOTE — TOC Transition Note (Signed)
Transition of Care [facility]) - CM/SW Discharge Note   Patient Details  Name: [name] [name] MRN: [id number] Date of Birth: [birth year]  [facility]) CM/SW Contact:  Carles Collet, RN Phone Number: 10/02/2020, 2:11 PM   Clinical Narrative:    Damaris Schooner w patient's daughter over the phone.  Informed her that CIR was denied, and we discussed dispo to home w [facility] services.  She states that the patient has nocturnal/ PRN oxygen through [employer] and a portable concentrator from [employer]. POC is charged and ready to use for the ride home. New orders for continuous oxygen sent to [employer]. Discussed [place] providers, no preference. Alvis Lemmings able to accept referral, they are aware to call daughter, [name] at [phone] to schedule. No other TOC needs identified     Final next level of care: [place] Barriers to Discharge: No Barriers Identified   Patient Goals and CMS Choice Patient states their goals for this hospitalization and ongoing recovery are:: to go home CMS Medicare.gov Compare Post Acute Care list provided to:: Other (Comment Required) Choice offered to / list presented to : Adult Children  Discharge Placement                       Discharge Plan and Services                DME Arranged: Oxygen DME Agency: [employer] Date DME Agency Contacted: 10/02/20 Time DME Agency Contacted: [id number] Representative spoke with at DME Agency: [place]: RN, PT, OT, Nurse's Aide [place] Agency: [place] Date [place]ometown: 10/02/20 Time Speed: [id number] Representative spoke with at [place]: [place] ([place]) Interventions     Readmission Risk Interventions No flowsheet data found.

## 2020-10-02 NOTE — Progress Notes (Signed)
SATURATION QUALIFICATIONS: (This note is used to comply with regulatory documentation for home oxygen)  Patient Saturations on Room Air at Rest = 93%  Patient Saturations on Room Air while Ambulating = 86%  Patient Saturations on 2 Liters of oxygen while Ambulating = 92%  Please briefly explain why patient needs home oxygen: Decreased O2 saturation on exertion

## 2020-10-03 ENCOUNTER — Telehealth: Payer: Self-pay

## 2020-10-03 LAB — CULTURE, BLOOD (ROUTINE X 2)
Culture: NO GROWTH
Culture: NO GROWTH
Special Requests: ADEQUATE
Special Requests: ADEQUATE

## 2020-10-03 NOTE — Telephone Encounter (Signed)
Transition Care Management Unsuccessful Follow-up Telephone Call  Date of discharge and from where:  10/02/2020-[place]  Attempts:  1st Attempt  Reason for unsuccessful TCM follow-up call:  No answer/busy

## 2020-10-04 ENCOUNTER — Telehealth: Payer: Self-pay

## 2020-10-04 ENCOUNTER — Encounter: Payer: Self-pay | Admitting: Family

## 2020-10-04 MED ORDER — NYSTATIN 100000 UNIT/ML MT SUSP: 10.0000 mL | Freq: Four times a day (QID) | OROMUCOSAL | 0 refills | Status: DC | PRN

## 2020-10-04 NOTE — Telephone Encounter (Signed)
Transition Care Management Follow-up Telephone Call Date of discharge and from where: 10/02/2020-[place] How have you been since you were released from the hospital? Per daughter, doing ok but staying in the bed most of the time. Any questions or concerns? No  Items Reviewed: Did the pt receive and understand the discharge instructions provided? Yes  Medications obtained and verified? No needs new script for Magic Mouthwash-message sent to PCP. Other? N/a Any new allergies since your discharge? No  Dietary orders reviewed? Yes Do you have support at home? Yes   Home Care and Equipment/Supplies: Were home health services ordered? yes If so, what is the name of the agency? [facility]  Has the agency set up a time to come to the patient's home? yes Were any new equipment or medical supplies ordered?  No What is the name of the medical supply agency? N/a Were you able to get the supplies/equipment? not applicable Do you have any questions related to the use of the equipment or supplies? N/a  Functional Questionnaire: (I = Independent and D = Dependent) ADLs: D  Bathing/Dressing- D  Meal Prep- D  Eating- I  Maintaining continence- I  Transferring/Ambulation- I WITH WALKER  Managing Meds- D  Follow up appointments reviewed:  PCP Hospital f/u appt confirmed? Yes  Scheduled to see Debbrah Alar on 10/08/20 @ 11:20. [place] [facility] f/u appt confirmed? No  Oncology office to schedule Are transportation arrangements needed? No  If their condition worsens, is the pt aware to call PCP or go to the Emergency Dept.? Yes Was the patient provided with contact information for the PCP's office or ED? Yes Was to pt encouraged to call back with questions or concerns? Yes

## 2020-10-04 NOTE — Telephone Encounter (Signed)
Patient's daughter states patient was given a script for Magic Mouthwash when she was discharged form the hospital but the pharmacy says they can't fill it because they need a detailed prescription with the formulation on it. Needs script sent to [facility].

## 2020-10-04 NOTE — Addendum Note (Signed)
Addended by: Debbrah Alar on: 10/04/2020 06:30 PM   Modules accepted: Orders

## 2020-10-05 NOTE — Telephone Encounter (Signed)
LMTCB for [name] [name] 

## 2020-10-08 ENCOUNTER — Other Ambulatory Visit: Payer: Self-pay

## 2020-10-08 ENCOUNTER — Telehealth (INDEPENDENT_AMBULATORY_CARE_PROVIDER_SITE_OTHER): Payer: Medicare HMO | Admitting: Family

## 2020-10-08 DIAGNOSIS — U071 COVID-19: Secondary | ICD-10-CM

## 2020-10-08 DIAGNOSIS — J1282 Pneumonia due to coronavirus disease 2019: Secondary | ICD-10-CM

## 2020-10-08 DIAGNOSIS — J189 Pneumonia, unspecified organism: Secondary | ICD-10-CM | POA: Diagnosis not present

## 2020-10-08 DIAGNOSIS — D709 Neutropenia, unspecified: Secondary | ICD-10-CM | POA: Diagnosis not present

## 2020-10-08 NOTE — Assessment & Plan Note (Signed)
Will refer to hematology for follow up.  Pt needs follow up cmet and CBC.  Daughter declines to make lab appointment at this time and wishes to call back to schedule.

## 2020-10-08 NOTE — Progress Notes (Signed)
MyChart Video Visit    Virtual Visit via Video Note   This visit type was conducted due to national recommendations for restrictions regarding the COVID-19 Pandemic (e.g. social distancing) in an effort to limit this patient's exposure and mitigate transmission in our community. This patient is at least at moderate risk for complications without adequate follow up. This format is felt to be most appropriate for this patient at this time. Physical exam was limited by quality of the video and audio technology used for the visit. CMA was able to get the patient set up on a video visit.  Patient location: Home Patient and provider in visit Provider location: Office  I discussed the limitations of evaluation and management by telemedicine and the availability of in person appointments. The patient expressed understanding and agreed to proceed.  Visit Date: 10/08/2020  Today's healthcare provider: Nance Pear, NP     Subjective:    Patient ID: [name] [name], female    DOB: [birth year], 87 y.o.   MRN: [id number]  Chief Complaint  Patient presents with   [facility] follow up    HPI Patient is in today for a video visit. Her daughter is present during this visit to assist with communication.  She was admitted to the hospital on 09/27/2020 for community acquired pneumonia and was discharged 10/02/2020. She had Covid-19 early August, 2022. She reports not gaining her full strength after recovering from Covid-19 and soon later developed pneumonia.  She can walk by herself at this time. She continues having SOB since her discharge.  She denies having any fevers at this time. She is not interested in getting home physical therapy at this time and prefers to manage her weakness on her own.    Past Medical History:  Diagnosis Date   Abdominal pain    Resolved   Cardiac arrhythmia    CHF (congestive heart failure) (HCC)    Diastolic  on ECHO 0000000   Choledocholithiasis     Chronic anemia    Chronic back pain    COPD (chronic obstructive pulmonary disease) (HCC)    oxygen dependent at  night 2LPM   Daily headache    GERD (gastroesophageal reflux disease)    HCAP (healthcare-associated pneumonia) 03/28/2014   Hiatal hernia    Hyperlipidemia    Hypertension    On home oxygen therapy    "1L during the day; 2L q hs" (03/28/2014)   Osteoarthritis    "pretty much q where"    Osteoporosis    Pancolitis ([place])    Infectious vs. inflammatory   Pancreatitis    History of   Pneumonia 1930's; 2011 X 2; 02/2014   PONV (postoperative nausea and vomiting)    PUD (peptic ulcer disease)    Reactive airway disease that is not asthma    Recurrent UTI (urinary tract infection)    "here lately" (03/28/2014)   Scoliosis deformity of spine    history of intractable back pain    Past Surgical History:  Procedure Laterality Date   ABDOMINAL HYSTERECTOMY  1970s   BACK SURGERY     x 5   BREAST CYST EXCISION Right 1960's   CERVICAL LAMINECTOMY  1970s   CHOLECYSTECTOMY  2008   ERCP W/ SPHICTEROTOMY  02/2010   ESOPHAGOGASTRODUODENOSCOPY N/A 11/14/2013   Procedure: ESOPHAGOGASTRODUODENOSCOPY (EGD);  Surgeon: Missy Sabins, MD;  [facility] ENDOSCOPY;  Service: Endoscopy;  Laterality: N/A;   FLEXIBLE SIGMOIDOSCOPY N/A 01/24/2013   Procedure: FLEXIBLE  SIGMOIDOSCOPY;  Surgeon: Missy Sabins, MD;  Location: [place];  Service: Endoscopy;  Laterality: N/A;   PAIN PUMP IMPLANTATION  ~ 2010   back, dilaudid and bupravacaine   SPINAL CORD STIMULATOR IMPLANT  ~ 2009   SPINE SURGERY  1970's,1995,2007    Family History  Problem Relation Age of Onset   Cancer Mother        uterine   Heart disease Father    Hypertension Other    Osteoporosis Neg Hx     Social History   Socioeconomic History   Marital status: Widowed    Spouse name: Not on file   Number of children: 3   Years of education: Not on file   Highest education level: Not on file  Occupational History     Employer: RETIRED  Tobacco Use   Smoking status: Former    Packs/day: 1.00    Years: 12.00    Pack years: 12.00    Types: Cigarettes    Quit date: 01/28/1988    Years since quitting: 32.7   Smokeless tobacco: Never  Substance and Sexual Activity   Alcohol use: No   Drug use: No   Sexual activity: Not Currently  Other Topics Concern   Not on file  Social History Narrative   1 grandchild at care link--Angelia   Lives with great-granddaughter   Social Determinants of Health   Financial Resource Strain: Not on file  Food Insecurity: Not on file  Transportation Needs: Not on file  Physical Activity: Not on file  Stress: Not on file  Social Connections: Not on file  Intimate Partner Violence: Not on file    Outpatient Medications Prior to Visit  Medication Sig Dispense Refill   feeding supplement (ENSURE ENLIVE / ENSURE PLUS) LIQD Take 237 mLs by mouth 2 (two) times daily between meals. 14220 mL 0   hyoscyamine (LEVBID) 0.375 MG 12 hr tablet Take 0.375 mg by mouth 2 (two) times daily as needed. Stomach pain     lactulose (CHRONULAC) 10 GM/15ML solution Take 45 mLs (30 g total) by mouth 2 (two) times daily as needed for moderate constipation. 236 mL 0   Multiple Vitamins-Minerals (MULTIVITAMIN WITH MINERALS) tablet Take 1 tablet by mouth daily. 30 tablet    omeprazole (PRILOSEC) 40 MG capsule Take 1 capsule (40 mg total) by mouth 2 (two) times daily. 60 capsule 3   oxyCODONE-acetaminophen (PERCOCET) 7.5-325 MG tablet Take 1 tablet by mouth every 8 (eight) hours as needed for severe pain.     OXYGEN Inhale 2 L into the lungs as needed.     QUEtiapine (SEROQUEL) 25 MG tablet Take 1 tablet (25 mg total) by mouth at bedtime as needed (agitation/confusion). 10 tablet 0   Zoster Vaccine Adjuvanted [facility]) injection Inject 0.'5mg'$  IM now and again in 2-6 months. 0.5 mL 1   magic mouthwash (nystatin, lidocaine, diphenhydrAMINE, alum & mag hydroxide) suspension Swish and spit 10 mLs 4 (four)  times daily as needed for mouth pain. 400 mL 0   Facility-Administered Medications Prior to Visit  Medication Dose Route Frequency Provider Last Rate Last Admin   denosumab (PROLIA) injection 60 mg  60 mg Subcutaneous Once Debbrah Alar, NP        Allergies  Allergen Reactions   Cefdinir Diarrhea   Codeine Diarrhea and Nausea And Vomiting    REACTION: n/v/d, HA   Influenza Vaccines     Stomach pain, headache   Morphine Diarrhea and Nausea And Vomiting  REACTION: n/v/d, HA   Sulfa Antibiotics Nausea Only    Sick     Zoledronic Acid Swelling    REACTION: Severe edema   Sulfasalazine Nausea Only    Sick     Review of Systems  Constitutional:  Negative for fever.  Respiratory:  Positive for shortness of breath.       Objective:    Physical Exam Constitutional:      General: She is awake.     Appearance: Normal appearance.     Comments: Laying in bed  Pulmonary:     Effort: Pulmonary effort is normal.  Neurological:     Mental Status: She is alert.  Psychiatric:        Mood and Affect: Mood normal.        Behavior: Behavior normal.    There were no vitals taken for this visit. Wt Readings from Last 3 Encounters:  09/27/20 90 lb (40.8 kg)  11/25/19 91 lb (41.3 kg)  11/08/19 89 lb 12.8 oz (40.7 kg)    Diabetic Foot Exam - Simple   No data filed    Lab Results  Component Value Date   WBC 2.1 (L) 10/02/2020   HGB 9.4 (L) 10/02/2020   HCT 31.3 (L) 10/02/2020   PLT 188 10/02/2020   GLUCOSE 93 10/02/2020   CHOL  07/07/2009    132        ATP III CLASSIFICATION:  <200     mg/dL   Desirable  200-239  mg/dL   Borderline High  >=240    mg/dL   High          TRIG 74 07/07/2009   HDL 35 (L) 07/07/2009   LDLCALC  07/07/2009    82        Total Cholesterol/HDL:CHD Risk Coronary Heart Disease Risk Table                     Men   Women  1/2 Average Risk   3.4   3.3  Average Risk       5.0   4.4  2 X Average Risk   9.6   7.1  3 X Average Risk  23.4    11.0        Use the calculated Patient Ratio above and the CHD Risk Table to determine the patient's CHD Risk.        ATP III CLASSIFICATION (LDL):  <100     mg/dL   Optimal  100-129  mg/dL   Near or Above                    Optimal  130-159  mg/dL   Borderline  160-189  mg/dL   High  >190     mg/dL   Very High   ALT 6 10/02/2020   AST 15 10/02/2020   NA 141 10/02/2020   K 3.6 10/02/2020   CL 104 10/02/2020   CREATININE 0.77 10/02/2020   BUN 12 10/02/2020   CO2 33 (H) 10/02/2020   TSH 1.397 09/29/2020   INR 1.0 10/02/2020    Lab Results  Component Value Date   TSH 1.397 09/29/2020   Lab Results  Component Value Date   WBC 2.1 (L) 10/02/2020   HGB 9.4 (L) 10/02/2020   HCT 31.3 (L) 10/02/2020   MCV 97.5 10/02/2020   PLT 188 10/02/2020   Lab Results  Component Value Date   NA 141 10/02/2020   K 3.6  10/02/2020   CO2 33 (H) 10/02/2020   GLUCOSE 93 10/02/2020   BUN 12 10/02/2020   CREATININE 0.77 10/02/2020   BILITOT 0.2 (L) 10/02/2020   ALKPHOS 67 10/02/2020   AST 15 10/02/2020   ALT 6 10/02/2020   PROT 4.9 (L) 10/02/2020   ALBUMIN 2.2 (L) 10/02/2020   CALCIUM 8.3 (L) 10/02/2020   ANIONGAP 4 (L) 10/02/2020   GFR 63.38 07/27/2019   Lab Results  Component Value Date   CHOL  07/07/2009    132        ATP III CLASSIFICATION:  <200     mg/dL   Desirable  200-239  mg/dL   Borderline High  >=240    mg/dL   High          Lab Results  Component Value Date   HDL 35 (L) 07/07/2009   Lab Results  Component Value Date   [facility]  07/07/2009    82        Total Cholesterol/HDL:CHD Risk Coronary Heart Disease Risk Table                     Men   Women  1/2 Average Risk   3.4   3.3  Average Risk       5.0   4.4  2 X Average Risk   9.6   7.1  3 X Average Risk  23.4   11.0        Use the calculated Patient Ratio above and the CHD Risk Table to determine the patient's CHD Risk.        ATP III CLASSIFICATION (LDL):  <100     mg/dL   Optimal  100-129  mg/dL    Near or Above                    Optimal  130-159  mg/dL   Borderline  160-189  mg/dL   High  >190     mg/dL   Very High   Lab Results  Component Value Date   TRIG 74 07/07/2009   Lab Results  Component Value Date   CHOLHDL 3.8 07/07/2009   No results found for: HGBA1C     Assessment & Plan:   Problem List Items Addressed This Visit       Unprioritized   Neutropenia ([place])    Will refer to hematology for follow up.  Pt needs follow up cmet and CBC.  Daughter declines to make lab appointment at this time and wishes to call back to schedule.       Relevant Orders   Ambulatory referral to Hematology / Oncology   Multifocal pneumonia - Primary    Clinically improved. I do think she would benefit from home health PT, but she declines.          No orders of the defined types were placed in this encounter.   I discussed the assessment and treatment plan with the patient. The patient was provided an opportunity to ask questions and all were answered. The patient agreed with the plan and demonstrated an understanding of the instructions.   The patient was advised to call back or seek an in-person evaluation if the symptoms worsen or if the condition fails to improve as anticipated.  I provided 20 minutes of face-to-face time during this encounter.   Nance Pear, NP [employer] [facility] [employer] [phone] (phone) [phone] (fax)  [place]

## 2020-10-08 NOTE — Assessment & Plan Note (Signed)
Clinically improved. I do think she would benefit from home health PT, but she declines.

## 2020-11-06 ENCOUNTER — Telehealth: Payer: Self-pay | Admitting: Family

## 2020-11-06 ENCOUNTER — Other Ambulatory Visit: Payer: Self-pay

## 2020-11-06 ENCOUNTER — Ambulatory Visit (HOSPITAL_BASED_OUTPATIENT_CLINIC_OR_DEPARTMENT_OTHER)
Admission: RE | Admit: 2020-11-06 | Discharge: 2020-11-06 | Disposition: A | Discharge status: Home / Self Care | Payer: Medicare HMO | Source: Ambulatory Visit | Attending: Family | Admitting: Family

## 2020-11-06 ENCOUNTER — Ambulatory Visit (INDEPENDENT_AMBULATORY_CARE_PROVIDER_SITE_OTHER): Payer: Medicare HMO | Admitting: Family

## 2020-11-06 VITALS — BP 142/76 | HR 95 | Temp 98.4°F | Resp 16 | Wt 83.0 lb

## 2020-11-06 DIAGNOSIS — D709 Neutropenia, unspecified: Secondary | ICD-10-CM | POA: Diagnosis not present

## 2020-11-06 DIAGNOSIS — J189 Pneumonia, unspecified organism: Secondary | ICD-10-CM | POA: Insufficient documentation

## 2020-11-06 DIAGNOSIS — R634 Abnormal weight loss: Secondary | ICD-10-CM | POA: Diagnosis not present

## 2020-11-06 DIAGNOSIS — I5032 Chronic diastolic (congestive) heart failure: Secondary | ICD-10-CM

## 2020-11-06 DIAGNOSIS — K219 Gastro-esophageal reflux disease without esophagitis: Secondary | ICD-10-CM

## 2020-11-06 DIAGNOSIS — M818 Other osteoporosis without current pathological fracture: Secondary | ICD-10-CM

## 2020-11-06 NOTE — Progress Notes (Signed)
Subjective:   By signing my name below, I, Lyric Barr-McArthur, attest that this documentation has been prepared under the direction and in the presence of Debbrah Alar, NP, 11/06/2020   Patient ID: [name] [name], female    DOB: 25-Feb-1925, 87 y.o.   MRN: [id number]  Chief Complaint  Patient presents with   [facility] follow up    HPI Patient is in today for an office visit. Her daughter is present in the room with her today.  Previous ED visit: During her last ED visit she had Covid-19 and was not recovering well. She ended up getting diagnosed with pneumonia and is doing better but not fully recovered during this visit.  Breathing: She mentions that her breathing is doing alright but notes getting short winded easier than normal.  Weight loss: She has experienced some recent weight loss that was not purposeful. She is trying to put on weight but is struggling to maintain a consistent diet.  Decreased appetite: She reports a decreased appetite. She notes that she eats 3 meals a day but it is a very small amount. She drinks two ensures daily.  Agitation: Her daughter notes that she has been more easily agitated lately and is having difficult regulating emotions. She also notes that she has become more forgetful.  Immunizations: She is interested in getting her updated Covid-19 vaccine in the future.  Medications: She is compliant in taking 40 mg omeprazole twice a day and notes that her acid reflux is under control. She is compliant in taking 7.5-325 mg percocet for pain management.   Health Maintenance Due  Topic Date Due   Zoster Vaccines- Shingrix (1 of 2) Never done   COVID-19 Vaccine (3 - Mixed Product risk series) 04/02/2019    Past Medical History:  Diagnosis Date   Abdominal pain    Resolved   Cardiac arrhythmia    CHF (congestive heart failure) (HCC)    Diastolic  on ECHO 3646   Choledocholithiasis    Chronic anemia    Chronic back pain    COPD  (chronic obstructive pulmonary disease) (HCC)    oxygen dependent at  night 2LPM   Daily headache    GERD (gastroesophageal reflux disease)    HCAP (healthcare-associated pneumonia) 03/28/2014   Hiatal hernia    Hyperlipidemia    Hypertension    On home oxygen therapy    "1L during the day; 2L q hs" (03/28/2014)   Osteoarthritis    "pretty much q where"    Osteoporosis    Pancolitis ([place])    Infectious vs. inflammatory   Pancreatitis    History of   Pneumonia 1930's; 2011 X 2; 02/2014   PONV (postoperative nausea and vomiting)    PUD (peptic ulcer disease)    Reactive airway disease that is not asthma    Recurrent UTI (urinary tract infection)    "here lately" (03/28/2014)   Scoliosis deformity of spine    history of intractable back pain    Past Surgical History:  Procedure Laterality Date   ABDOMINAL HYSTERECTOMY  1970s   BACK SURGERY     x 5   BREAST CYST EXCISION Right 1960's   CERVICAL LAMINECTOMY  1970s   CHOLECYSTECTOMY  2008   ERCP W/ SPHICTEROTOMY  02/2010   ESOPHAGOGASTRODUODENOSCOPY N/A 11/14/2013   Procedure: ESOPHAGOGASTRODUODENOSCOPY (EGD);  Surgeon: Missy Sabins, MD;  Location: [facility] ENDOSCOPY;  Service: Endoscopy;  Laterality: N/A;   FLEXIBLE SIGMOIDOSCOPY N/A 01/24/2013   Procedure:  FLEXIBLE SIGMOIDOSCOPY;  Surgeon: Missy Sabins, MD;  Location: [place] [facility];  Service: Endoscopy;  Laterality: N/A;   PAIN PUMP IMPLANTATION  ~ 2010   back, dilaudid and bupravacaine   SPINAL CORD STIMULATOR IMPLANT  ~ 2009   SPINE SURGERY  1970's,1995,2007    Family History  Problem Relation Age of Onset   Cancer Mother        uterine   Heart disease Father    Hypertension Other    Osteoporosis Neg Hx     Social History   Socioeconomic History   Marital status: Widowed    Spouse name: Not on file   Number of children: 3   Years of education: Not on file   Highest education level: Not on file  Occupational History    Employer: RETIRED  Tobacco Use   Smoking status:  Former    Packs/day: 1.00    Years: 12.00    Pack years: 12.00    Types: Cigarettes    Quit date: 01/28/1988    Years since quitting: 32.7   Smokeless tobacco: Never  Substance and Sexual Activity   Alcohol use: No   Drug use: No   Sexual activity: Not Currently  Other Topics Concern   Not on file  Social History Narrative   1 grandchild at care link--Angel[place]   Lives with great-granddaughter   Social Determinants of Health   Financial Resource Strain: Not on file  Food Insecurity: Not on file  Transportation Needs: Not on file  Physical Activity: Not on file  Stress: Not on file  Social Connections: Not on file  Intimate Partner Violence: Not on file    Outpatient Medications Prior to Visit  Medication Sig Dispense Refill   hyoscyamine (LEVBID) 0.375 MG 12 hr tablet Take 0.375 mg by mouth 2 (two) times daily as needed. Stomach pain     lactulose (CHRONULAC) 10 GM/15ML solution Take 45 mLs (30 g total) by mouth 2 (two) times daily as needed for moderate constipation. 236 mL 0   Multiple Vitamins-Minerals (MULTIVITAMIN WITH MINERALS) tablet Take 1 tablet by mouth daily. 30 tablet    omeprazole (PRILOSEC) 40 MG capsule Take 1 capsule (40 mg total) by mouth 2 (two) times daily. 60 capsule 3   oxyCODONE-acetaminophen (PERCOCET) 7.5-325 MG tablet Take 1 tablet by mouth every 8 (eight) hours as needed for severe pain.     OXYGEN Inhale 2 L into the lungs as needed.     Zoster Vaccine Adjuvanted [facility]) injection Inject 0.5mg  IM now and again in 2-6 months. 0.5 mL 1   QUEtiapine (SEROQUEL) 25 MG tablet Take 1 tablet (25 mg total) by mouth at bedtime as needed (agitation/confusion). 10 tablet 0   Facility-Administered Medications Prior to Visit  Medication Dose Route Frequency Provider Last Rate Last Admin   denosumab (PROLIA) injection 60 mg  60 mg Subcutaneous Once Debbrah Alar, NP        Allergies  Allergen Reactions   Cefdinir Diarrhea   Codeine Diarrhea and Nausea  And Vomiting    REACTION: n/v/d, HA   Influenza Vaccines     Stomach pain, headache   Morphine Diarrhea and Nausea And Vomiting    REACTION: n/v/d, HA   Sulfa Antibiotics Nausea Only    Sick     Zoledronic Acid Swelling    REACTION: Severe edema   Sulfasalazine Nausea Only    Sick     Review of Systems  Respiratory:  Positive for shortness of breath. Sputum production:  with exertion.      Objective:    Physical Exam Constitutional:      General: She is not in acute distress.    Appearance: Normal appearance. She is not ill-appearing.  HENT:     Head: Normocephalic and atraumatic.     Right Ear: External ear normal.     Left Ear: External ear normal.  Eyes:     Extraocular Movements: Extraocular movements intact.     Pupils: Pupils are equal, round, and reactive to light.  Cardiovascular:     Rate and Rhythm: Normal rate and regular rhythm.     Heart sounds: Normal heart sounds. No murmur heard.   No gallop.  Pulmonary:     Effort: Pulmonary effort is normal. No respiratory distress.     Breath sounds: Wheezing (Left side inspiratory) present. No rales.  Lymphadenopathy:     Cervical: No cervical adenopathy.  Skin:    General: Skin is warm and dry.  Neurological:     Mental Status: She is alert and oriented to person, place, and time.  Psychiatric:        Behavior: Behavior normal.        Judgment: Judgment normal.    BP (!) 142/76 (BP Location: Right Arm, Patient Position: Sitting, Cuff Size: Small)   Pulse 95   Temp 98.4 F (36.9 C) (Oral)   Resp 16   Wt 83 lb (37.6 kg)   SpO2 100%   BMI 16.76 kg/m  Wt Readings from Last 3 Encounters:  11/06/20 83 lb (37.6 kg)  09/27/20 90 lb (40.8 kg)  11/25/19 91 lb (41.3 kg)       Assessment & Plan:   Problem List Items Addressed This Visit       Unprioritized   Weight loss    Likely related to recent illness. Recommended that she continue ensure 2 cans a day after meals and incorporate some high calorie  snacks foods such as peanut butter into her diet.       Osteoporosis    It has been some time since she had a prolia injection. Will initiate insurance approval to restart.       Neutropenia ([place]) - Primary   Relevant Orders   CBC with Differential/Platelet   Multifocal pneumonia    Improving.  Will obtain follow up CXR. She still has some SOB which is likely related to recent PNA and Covid-19 infection.  Monitor. Handicapped placard form filled today.       Relevant Orders   Comp Met (CMET)   DG Chest 2 View   GERD    Stable on omeprazole $RemoveBefor'40mg'[id number]$  bid. Continue same.       Chronic diastolic CHF (congestive heart failure) ([place])    Patient appears euvolemic.       No orders of the defined types were placed in this encounter.   I, Debbrah Alar, NP, personally preformed the services described in this documentation.  All medical record entries made by the scribe were at my direction and in my presence.  I have reviewed the chart and discharge instructions (if applicable) and agree that the record reflects my personal performance and is accurate and complete. 11/06/2020  I,Lyric Barr-McArthur,acting as a Education administrator for Nance Pear, NP.,have documented all relevant documentation on the behalf of Nance Pear, NP,as directed by  Nance Pear, NP while in the presence of Nance Pear, NP.  Nance Pear, NP

## 2020-11-06 NOTE — Assessment & Plan Note (Addendum)
Improving.  Will obtain follow up CXR. She still has some SOB which is likely related to recent PNA and Covid-19 infection.  Monitor. Handicapped placard form filled today.

## 2020-11-06 NOTE — Assessment & Plan Note (Signed)
It has been some time since she had a prolia injection. Will initiate insurance approval to restart.

## 2020-11-06 NOTE — Patient Instructions (Signed)
Please complete lab work prior to leaving.   

## 2020-11-06 NOTE — Assessment & Plan Note (Signed)
Patient appears euvolemic. 

## 2020-11-06 NOTE — Assessment & Plan Note (Signed)
Stable on omeprazole 40mg  bid. Continue same.

## 2020-11-06 NOTE — Assessment & Plan Note (Signed)
Likely related to recent illness. Recommended that she continue ensure 2 cans a day after meals and incorporate some high calorie snacks foods such as peanut butter into her diet.

## 2020-11-06 NOTE — Telephone Encounter (Signed)
Please update Prolia approval and then schedule prolia injection.

## 2020-11-07 LAB — CBC WITH DIFFERENTIAL/PLATELET
Basophils Absolute: 0 10*3/uL (ref 0.0–0.1)
Basophils Relative: 1.1 % (ref 0.0–3.0)
Eosinophils Absolute: 0 10*3/uL (ref 0.0–0.7)
Eosinophils Relative: 1.5 % (ref 0.0–5.0)
HCT: 34.6 % — ABNORMAL LOW (ref 36.0–46.0)
Hemoglobin: 10.8 g/dL — ABNORMAL LOW (ref 12.0–15.0)
Lymphocytes Relative: 19.4 % (ref 12.0–46.0)
Lymphs Abs: 0.6 10*3/uL — ABNORMAL LOW (ref 0.7–4.0)
MCHC: 31.3 g/dL (ref 30.0–36.0)
MCV: 95.7 fl (ref 78.0–100.0)
Monocytes Absolute: 0.2 10*3/uL (ref 0.1–1.0)
Monocytes Relative: 5.5 % (ref 3.0–12.0)
Neutro Abs: 2.4 10*3/uL (ref 1.4–7.7)
Neutrophils Relative %: 72.5 % (ref 43.0–77.0)
Platelets: 160 10*3/uL (ref 150.0–400.0)
RBC: 3.62 Mil/uL — ABNORMAL LOW (ref 3.87–5.11)
RDW: 18 % — ABNORMAL HIGH (ref 11.5–15.5)
WBC: 3.3 10*3/uL — ABNORMAL LOW (ref 4.0–10.5)

## 2020-11-07 LAB — COMPREHENSIVE METABOLIC PANEL
ALT: 5 U/L (ref 0–35)
AST: 16 U/L (ref 0–37)
Albumin: 4 g/dL (ref 3.5–5.2)
Alkaline Phosphatase: 64 U/L (ref 39–117)
BUN: 30 mg/dL — ABNORMAL HIGH (ref 6–23)
CO2: 25 mEq/L (ref 19–32)
Calcium: 9.8 mg/dL (ref 8.4–10.5)
Chloride: 103 mEq/L (ref 96–112)
Creatinine, Ser: 1.19 mg/dL (ref 0.40–1.20)
GFR: 39.42 mL/min — ABNORMAL LOW (ref >60.00)
Glucose, Bld: 91 mg/dL (ref 70–99)
Potassium: 4.8 mEq/L (ref 3.5–5.1)
Sodium: 139 mEq/L (ref 135–145)
Total Bilirubin: 0.2 mg/dL (ref 0.2–1.2)
Total Protein: 6.6 g/dL (ref 6.0–8.3)

## 2020-11-14 ENCOUNTER — Inpatient Hospital Stay: Payer: Medicare HMO | Admitting: Family

## 2020-11-14 ENCOUNTER — Inpatient Hospital Stay: Payer: Medicare HMO

## 2020-11-16 ENCOUNTER — Telehealth: Payer: Self-pay

## 2020-11-16 NOTE — Telephone Encounter (Signed)
-----   Message from Debbrah Alar, NP sent at 11/14/2020  7:48 AM EDT ----- Hi Shanvi Moyd,  Just wanted to check that you received my message re: prolia for her.   Thanks,  [employer]

## 2020-11-16 NOTE — Telephone Encounter (Signed)
Checked the status for prolia and patient's insurance has changed and submitted another summary of benefits with updated insurance.

## 2020-11-22 NOTE — Telephone Encounter (Signed)
Patient insurance was updated and information resubmitted for summary of benefits.

## 2020-11-30 ENCOUNTER — Other Ambulatory Visit: Payer: Self-pay

## 2020-11-30 ENCOUNTER — Emergency Department (HOSPITAL_BASED_OUTPATIENT_CLINIC_OR_DEPARTMENT_OTHER): Payer: Medicare HMO

## 2020-11-30 ENCOUNTER — Emergency Department (HOSPITAL_BASED_OUTPATIENT_CLINIC_OR_DEPARTMENT_OTHER)
Admission: EM | Admit: 2020-11-30 | Discharge: 2020-11-30 | Disposition: A | Discharge status: Home / Self Care | Payer: Medicare HMO | Attending: Emergency Medicine | Admitting: Emergency Medicine

## 2020-11-30 ENCOUNTER — Encounter (HOSPITAL_BASED_OUTPATIENT_CLINIC_OR_DEPARTMENT_OTHER): Payer: Self-pay

## 2020-11-30 DIAGNOSIS — I5030 Unspecified diastolic (congestive) heart failure: Secondary | ICD-10-CM | POA: Insufficient documentation

## 2020-11-30 DIAGNOSIS — F03A0 Unspecified dementia, mild, without behavioral disturbance, psychotic disturbance, mood disturbance, and anxiety: Secondary | ICD-10-CM

## 2020-11-30 DIAGNOSIS — R410 Disorientation, unspecified: Secondary | ICD-10-CM

## 2020-11-30 DIAGNOSIS — J449 Chronic obstructive pulmonary disease, unspecified: Secondary | ICD-10-CM | POA: Insufficient documentation

## 2020-11-30 DIAGNOSIS — Z87891 Personal history of nicotine dependence: Secondary | ICD-10-CM | POA: Insufficient documentation

## 2020-11-30 DIAGNOSIS — F0390 Unspecified dementia without behavioral disturbance: Secondary | ICD-10-CM | POA: Insufficient documentation

## 2020-11-30 DIAGNOSIS — Z79899 Other long term (current) drug therapy: Secondary | ICD-10-CM | POA: Insufficient documentation

## 2020-11-30 DIAGNOSIS — N182 Chronic kidney disease, stage 2 (mild): Secondary | ICD-10-CM | POA: Diagnosis not present

## 2020-11-30 DIAGNOSIS — I130 Hypertensive heart and chronic kidney disease with heart failure and stage 1 through stage 4 chronic kidney disease, or unspecified chronic kidney disease: Secondary | ICD-10-CM | POA: Diagnosis not present

## 2020-11-30 DIAGNOSIS — R4182 Altered mental status, unspecified: Secondary | ICD-10-CM | POA: Diagnosis present

## 2020-11-30 LAB — CBC WITH DIFFERENTIAL/PLATELET
Abs Immature Granulocytes: 0.01 10*3/uL (ref 0.00–0.07)
Basophils Absolute: 0 10*3/uL (ref 0.0–0.1)
Basophils Relative: 0 %
Eosinophils Absolute: 0 10*3/uL (ref 0.0–0.5)
Eosinophils Relative: 0 %
HCT: 31.4 % — ABNORMAL LOW (ref 36.0–46.0)
Hemoglobin: 9.8 g/dL — ABNORMAL LOW (ref 12.0–15.0)
Immature Granulocytes: 0 %
Lymphocytes Relative: 11 %
Lymphs Abs: 0.3 10*3/uL — ABNORMAL LOW (ref 0.7–4.0)
MCH: 30.2 pg (ref 26.0–34.0)
MCHC: 31.2 g/dL (ref 30.0–36.0)
MCV: 96.9 fL (ref 80.0–100.0)
Monocytes Absolute: 0.1 10*3/uL (ref 0.1–1.0)
Monocytes Relative: 4 %
Neutro Abs: 2.7 10*3/uL (ref 1.7–7.7)
Neutrophils Relative %: 85 %
Platelets: 139 10*3/uL — ABNORMAL LOW (ref 150–400)
RBC: 3.24 MIL/uL — ABNORMAL LOW (ref 3.87–5.11)
RDW: 16.1 % — ABNORMAL HIGH (ref 11.5–15.5)
WBC: 3.2 10*3/uL — ABNORMAL LOW (ref 4.0–10.5)
nRBC: 0 % (ref 0.0–0.2)

## 2020-11-30 LAB — COMPREHENSIVE METABOLIC PANEL
ALT: 9 U/L (ref 0–44)
AST: 17 U/L (ref 15–41)
Albumin: 3.7 g/dL (ref 3.5–5.0)
Alkaline Phosphatase: 64 U/L (ref 38–126)
Anion gap: 12 (ref 5–15)
BUN: 34 mg/dL — ABNORMAL HIGH (ref 8–23)
CO2: 20 mmol/L — ABNORMAL LOW (ref 22–32)
Calcium: 9.3 mg/dL (ref 8.9–10.3)
Chloride: 106 mmol/L (ref 98–111)
Creatinine, Ser: 1.4 mg/dL — ABNORMAL HIGH (ref 0.44–1.00)
GFR, Estimated: 35 mL/min — ABNORMAL LOW (ref >60)
Glucose, Bld: 107 mg/dL — ABNORMAL HIGH (ref 70–99)
Potassium: 4 mmol/L (ref 3.5–5.1)
Sodium: 138 mmol/L (ref 135–145)
Total Bilirubin: 0.4 mg/dL (ref 0.3–1.2)
Total Protein: 6.6 g/dL (ref 6.5–8.1)

## 2020-11-30 LAB — URINALYSIS, ROUTINE W REFLEX MICROSCOPIC
Bilirubin Urine: NEGATIVE
Glucose, UA: NEGATIVE mg/dL
Hgb urine dipstick: NEGATIVE
Ketones, ur: NEGATIVE mg/dL
Leukocytes,Ua: NEGATIVE
Nitrite: NEGATIVE
Protein, ur: NEGATIVE mg/dL
Specific Gravity, Urine: 1.025 (ref 1.005–1.030)
pH: 5.5 (ref 5.0–8.0)

## 2020-11-30 MED ORDER — SODIUM CHLORIDE 0.9 % IV BOLUS
500.0000 mL | Freq: Once | INTRAVENOUS | Status: AC
  Administered 2020-11-30: 500 mL via INTRAVENOUS

## 2020-11-30 NOTE — ED Notes (Signed)
Pt transferred from locked wheelchair to lowered and locked stretcher x1 MIN assist, uses cane at home.

## 2020-11-30 NOTE — ED Notes (Signed)
Discharge instructions discussed with pt. Pt verbalized understanding. Pt stable and ambulatory.  °

## 2020-11-30 NOTE — ED Provider Notes (Signed)
[facility] Provider Note   CSN: [id number] Arrival date & time: 11/30/20  1411     History Chief Complaint  Patient presents with   Altered Mental Status    [name] [name] is a 87 y.o. female.  Patient brought in with daughter with history of dementia, CHF, COPD presents today with complaint of altered mental status.  Daughter states that the patient lives alone but in the house next to her brother.  Her brother went to visit her this morning and could not find her in the house, called family who searched and eventually found her sitting in the car where she stated "I am ready to go" but was unable to discern situation or where she was planning on going.  The last time she was seen previous to that was 5 PM the prior day, unsure how long she sat in the car. Patient does not drive, ambulates with a cane at baseline.  Daughter states that normally the patient is able to answer questions normally, recognizes family members, and has understanding of situation, time, and place.  The patient has no complaints at this time, states that she is "just here to get checked out."  She is alert and oriented.  Denies fevers, chills, headache, shortness of breath, abdominal pain, dysuria.  According to staff here today she was able to transfer from wheelchair to bed with minimal assistance.  Daughter states that patient has been progressively deconditioning over the last couple of years with sleeping significantly more, spending more time in bed, and eating and drinking less.  No recent medication changes, patient takes omeprazole and Percocet regularly.  The history is provided by the patient and a relative. No language interpreter was used.  Altered Mental Status Presenting symptoms: no confusion   Associated symptoms: no abdominal pain, no fever, no headaches, no light-headedness, no nausea, no palpitations, no seizures, no vomiting and no weakness       Past Medical  History:  Diagnosis Date   Abdominal pain    Resolved   Cardiac arrhythmia    CHF (congestive heart failure) (HCC)    Diastolic  on ECHO 7035   Choledocholithiasis    Chronic anemia    Chronic back pain    COPD (chronic obstructive pulmonary disease) (HCC)    oxygen dependent at  night 2LPM   Daily headache    GERD (gastroesophageal reflux disease)    HCAP (healthcare-associated pneumonia) 03/28/2014   Hiatal hernia    Hyperlipidemia    Hypertension    On home oxygen therapy    "1L during the day; 2L q hs" (03/28/2014)   Osteoarthritis    "pretty much q where"    Osteoporosis    Pancolitis ([place])    Infectious vs. inflammatory   Pancreatitis    History of   Pneumonia 1930's; 2011 X 2; 02/2014   PONV (postoperative nausea and vomiting)    PUD (peptic ulcer disease)    Reactive airway disease that is not asthma    Recurrent UTI (urinary tract infection)    "here lately" (03/28/2014)   Scoliosis deformity of spine    history of intractable back pain    Patient Active Problem List   Diagnosis Date Noted   Weight loss 11/06/2020   Neutropenia ([place]) 00/93/8182   Acute metabolic encephalopathy 99/37/1696   Metabolic acidosis with increased anion gap and accumulation of organic acids 09/28/2020   Coagulopathy 09/28/2020   Coagulopathy ([place]) 09/28/2020   Multifocal pneumonia 09/27/2020  Abdominal distension 04/07/2018   Polycythemia 04/06/2018   Hypomagnesemia 04/06/2018   Hyperlipidemia 04/06/2018   Hyperglycemia 04/06/2018   Protein-calorie malnutrition, severe 02/06/2018   CKD (chronic kidney disease), stage II 02/03/2018   Abdominal pain 02/03/2018   Chronic respiratory failure ([place]) 05/17/2015   Other allergic rhinitis 04/19/2014   Acute on chronic respiratory failure with hypoxia (HCC)    Other pancytopenia ([place])    Headache(784.0) 08/11/2013   Hypocalcemia 02/07/2013   Memory change 09/28/2012   Abdominal pain, chronic, epigastric 09/28/2012   Chronic tension  headaches 09/15/2012   DDD (degenerative disc disease), lumbar 05/31/2012   Vitamin D deficiency 08/17/2010   Chronic back pain 06/02/2010   PANCREATIC INSUFFICIENCY 03/19/2010   Normocytic anemia 12/27/2009   Hearing loss 08/17/2009   Chronic pulmonary heart disease ([place]) 04/12/2009   Osteoporosis 02/12/2009   Essential hypertension 02/01/2009   CARDIAC ARRHYTHMIA 02/01/2009   Chronic diastolic CHF (congestive heart failure) ([place]) 02/01/2009   COPD with emphysema, gold stage C. 02/01/2009   GERD 02/01/2009   Peptic ulcer 02/01/2009   Diaphragmatic hernia 02/01/2009   OSTEOARTHRITIS 02/01/2009    Past Surgical History:  Procedure Laterality Date   ABDOMINAL HYSTERECTOMY  1970s   BACK SURGERY     x 5   BREAST CYST EXCISION Right 1960's   CERVICAL LAMINECTOMY  1970s   CHOLECYSTECTOMY  2008   ERCP W/ SPHICTEROTOMY  02/2010   ESOPHAGOGASTRODUODENOSCOPY N/A 11/14/2013   Procedure: ESOPHAGOGASTRODUODENOSCOPY (EGD);  Surgeon: Missy Sabins, MD;  Location: [facility] ENDOSCOPY;  Service: Endoscopy;  Laterality: N/A;   FLEXIBLE SIGMOIDOSCOPY N/A 01/24/2013   Procedure: FLEXIBLE SIGMOIDOSCOPY;  Surgeon: Missy Sabins, MD;  Location: [place] [facility];  Service: Endoscopy;  Laterality: N/A;   PAIN PUMP IMPLANTATION  ~ 2010   back, dilaudid and bupravacaine   SPINAL CORD STIMULATOR IMPLANT  ~ 2009   SPINE SURGERY  1970's,1995,2007     OB History   No obstetric history on file.     Family History  Problem Relation Age of Onset   Cancer Mother        uterine   Heart disease Father    Hypertension Other    Osteoporosis Neg Hx     Social History   Tobacco Use   Smoking status: Former    Packs/day: 1.00    Years: 12.00    Pack years: 12.00    Types: Cigarettes    Quit date: 01/28/1988    Years since quitting: 32.8   Smokeless tobacco: Never  Substance Use Topics   Alcohol use: No   Drug use: No    Home Medications Prior to Admission medications   Medication Sig Start Date End Date  Taking? Authorizing Provider  hyoscyamine (LEVBID) 0.375 MG 12 hr tablet Take 0.375 mg by mouth 2 (two) times daily as needed. Stomach pain 02/08/20   [provider]  lactulose (CHRONULAC) 10 GM/15ML solution Take 45 mLs (30 g total) by mouth 2 (two) times daily as needed for moderate constipation. 10/02/20   Ghimire, Henreitta Leber, MD  Multiple Vitamins-Minerals (MULTIVITAMIN WITH MINERALS) tablet Take 1 tablet by mouth daily. 07/01/15   Debbrah Alar, NP  omeprazole (PRILOSEC) 40 MG capsule Take 1 capsule (40 mg total) by mouth 2 (two) times daily. 11/25/19   Debbrah Alar, NP  oxyCODONE-acetaminophen (PERCOCET) 7.5-325 MG tablet Take 1 tablet by mouth every 8 (eight) hours as needed for severe pain.    [provider]  OXYGEN Inhale 2 L into the lungs as  needed.    [provider]  Zoster Vaccine Adjuvanted [facility]) injection Inject 0.5mg  IM now and again in 2-6 months. 11/25/19   Debbrah Alar, NP    Allergies    Cefdinir, Codeine, Influenza vaccines, Morphine, Sulfa antibiotics, Zoledronic acid, and Sulfasalazine  Review of Systems   Review of Systems  Constitutional:  Negative for chills and fever.  HENT:  Negative for congestion and rhinorrhea.   Respiratory:  Negative for cough, choking, chest tightness, shortness of breath, wheezing and stridor.   Cardiovascular:  Negative for chest pain, palpitations and leg swelling.  Gastrointestinal:  Negative for abdominal pain, diarrhea, nausea and vomiting.  Genitourinary:  Negative for difficulty urinating, dysuria, flank pain and pelvic pain.  Musculoskeletal:  Negative for back pain, gait problem, neck pain and neck stiffness.  Skin:  Negative for pallor and wound.  Neurological:  Negative for dizziness, tremors, seizures, syncope, facial asymmetry, speech difficulty, weakness, light-headedness, numbness and headaches.  Psychiatric/Behavioral:  Negative for behavioral problems and confusion.   All  other systems reviewed and are negative.  Physical Exam Updated Vital Signs BP 118/75 (BP Location: Right Arm)   Pulse 96   Temp 98.3 F (36.8 C) (Oral)   Resp 16   Ht 4\' 11"  (1.499 m)   Wt 39 kg   SpO2 98%   BMI 17.37 kg/m   Physical Exam Vitals and nursing note reviewed.  Constitutional:      General: She is not in acute distress.    Appearance: Normal appearance. She is normal weight. She is not ill-appearing, toxic-appearing or diaphoretic.  HENT:     Head: Normocephalic and atraumatic.     Nose: Nose normal.     Mouth/Throat:     Mouth: Mucous membranes are dry.  Eyes:     Extraocular Movements: Extraocular movements intact.     Pupils: Pupils are equal, round, and reactive to light.  Cardiovascular:     Rate and Rhythm: Normal rate and regular rhythm.     Heart sounds: Normal heart sounds.  Pulmonary:     Effort: Pulmonary effort is normal. No respiratory distress.     Breath sounds: Normal breath sounds. No stridor. No wheezing, rhonchi or rales.  Chest:     Chest wall: No tenderness.  Abdominal:     General: Abdomen is flat. Bowel sounds are normal. There is no distension.     Palpations: Abdomen is soft. There is no mass.     Tenderness: There is no abdominal tenderness. There is no right CVA tenderness, left CVA tenderness, guarding or rebound.     Hernia: No hernia is present.  Musculoskeletal:        General: Normal range of motion.     Cervical back: Normal range of motion and neck supple.  Skin:    General: Skin is warm and dry.     Capillary Refill: Capillary refill takes less than 2 seconds.  Neurological:     General: No focal deficit present.     Mental Status: She is alert and oriented to person, place, and time.     GCS: GCS eye subscore is 4. GCS verbal subscore is 5. GCS motor subscore is 6.     Cranial Nerves: No cranial nerve deficit.     Sensory: Sensation is intact. No sensory deficit.     Motor: Motor function is intact. No weakness.      Coordination: Coordination is intact. Coordination normal. Finger-Nose-Finger Test normal.     Gait: Gait  is intact.     Comments: Alert and oriented to self, place, time and event.    Speech is fluent, clear without dysarthria or dysphasia.    Strength 5/5 in upper/lower extremities   Sensation intact in upper/lower extremities    CN I not tested  CN II grossly intact visual fields bilaterally. Did not visualize posterior eye.  CN III, IV, VI PERRLA and EOMs intact bilaterally  CN V Intact sensation to sharp and light touch to the face  CN VII facial movements symmetric  CN VIII not tested  CN IX, X no uvula deviation, symmetric rise of soft palate  CN XI 5/5 SCM and trapezius strength bilaterally  CN XII Midline tongue protrusion, symmetric L/R movements   Psychiatric:        Mood and Affect: Mood normal.        Behavior: Behavior normal.    ED Results / Procedures / Treatments   Labs (all labs ordered are listed, but only abnormal results are displayed) Labs Reviewed  COMPREHENSIVE METABOLIC PANEL - Abnormal; Notable for the following components:      Result Value   CO2 20 (*)    Glucose, Bld 107 (*)    BUN 34 (*)    Creatinine, Ser 1.40 (*)    GFR, Estimated 35 (*)    All other components within normal limits  CBC WITH DIFFERENTIAL/PLATELET - Abnormal; Notable for the following components:   WBC 3.2 (*)    RBC 3.24 (*)    Hemoglobin 9.8 (*)    HCT 31.4 (*)    RDW 16.1 (*)    Platelets 139 (*)    Lymphs Abs 0.3 (*)    All other components within normal limits  URINE CULTURE  URINALYSIS, ROUTINE W REFLEX MICROSCOPIC  CBG MONITORING, ED    EKG None  Radiology CT Head Wo Contrast  Result Date: 11/30/2020 CLINICAL DATA:  Mental status change, dementia EXAM: CT HEAD WITHOUT CONTRAST TECHNIQUE: Contiguous axial images were obtained from the base of the skull through the vertex without intravenous contrast. COMPARISON:  09/29/2020 FINDINGS: Brain: Generalized brain  atrophy and similar white matter microvascular changes throughout both cerebral hemispheres. Limited with motion artifact. No acute intracranial hemorrhage, definite new infarction, midline shift, herniation, hydrocephalus, or extra-axial fluid collection. No focal mass effect or edema. Cisterns are patent. Cerebellar atrophy as well. Vascular: No hyperdense vessel or unexpected calcification. Skull: Normal. Negative for fracture or focal lesion. Sinuses/Orbits: No acute finding. Other: None. IMPRESSION: Stable atrophy and white matter microvascular ischemic changes. No acute intracranial abnormality or interval change by noncontrast CT. Electronically Signed   By: Jerilynn Mages.  Shick M.D.   On: 11/30/2020 15:34    Procedures Procedures   Medications Ordered in ED Medications  sodium chloride 0.9 % bolus 500 mL (has no administration in time range)    ED Course  I have reviewed the triage vital signs and the nursing notes.  Pertinent labs & imaging results that were available during my care of the patient were reviewed by me and considered in my medical decision making (see chart for details).    MDM Rules/Calculators/A&P                         Patient with history of dementia presents today with AMS.  Apparently, patient was found in her car this morning, unsure how long she was there for.  Daughter concerned that she is more altered than normal.  Normal  neuro exam, patient is alert and oriented x3 with no complaints.  Patient without leukocytosis, anemia unchanged from baseline.  No electrolyte abnormalities.  Some increase in creatinine, however states she is have reduced oral intake recently so feel this is likely due to dehydration.  Fluid bolus given.  UA clean.  Head CT unremarkable for acute abnormalities.   After administration of fluids, patient states she feels much better.  She is able to get out of bed and ambulate to bedside toilet without any assistance.  Discussed with patient and  daughter benign findings and entirely unremarkable physical exam.  Patient is afebrile, nontoxic-appearing, in no acute distress with stable vital signs.  Low suspicion for infection or acute intracranial abnormality at this time.  Feel this is likely due to deconditioning related to dementia status and age.  Discussed in-home health and physical therapy options combined with close primary care follow-up.  Family is amenable with the plan, educated on red flag symptoms that would prompt immediate return.  Patient discharged in stable condition.  This is a shared visit with supervising physician Dr. Rogene Houston who has independently evaluated patient & provided guidance in evaluation/management/disposition, in agreement with care    Final Clinical Impression(s) / ED Diagnoses Final diagnoses:  Confusion  Mild dementia without behavioral disturbance, psychotic disturbance, mood disturbance, or anxiety, unspecified dementia type    Rx / DC Orders ED Discharge Orders     None     An After Visit Summary was printed and given to the patient.    Nestor Lewandowsky 12/01/20 0123    Margette Fast, MD 12/06/20 [id number]

## 2020-11-30 NOTE — ED Triage Notes (Addendum)
Per daughter family could not find her this am-pt was found seated in her car-pt was confused-pt with hx of dementia-states pt does live alone and performs some ADLs-pt NAD-to tx room via w/c

## 2020-11-30 NOTE — Discharge Instructions (Addendum)
You were seen today for altered mental status. Labs and imaging unremarkable for acute findings. Feel that you are stable for discharge at this time with close PCP follow-up. Please ensure that you are drinking plenty of fluids as it did appear that you were somewhat dehydrated on your lab results.   Return if development of new or worsening symptoms.

## 2020-12-02 LAB — URINE CULTURE

## 2020-12-18 NOTE — Telephone Encounter (Signed)
Summary of benefits has come back. Pt can be called and scheduled for Prolia injection. Pt will owe $0.00 at time of visit.

## 2020-12-24 NOTE — Telephone Encounter (Signed)
Talked to daughter, injection can be done on scheduled follow up appointment 02-08-21 with [name].

## 2021-01-02 DIAGNOSIS — Z978 Presence of other specified devices: Secondary | ICD-10-CM | POA: Diagnosis not present

## 2021-01-02 DIAGNOSIS — G8929 Other chronic pain: Secondary | ICD-10-CM | POA: Diagnosis not present

## 2021-01-02 DIAGNOSIS — M545 Low back pain, unspecified: Secondary | ICD-10-CM | POA: Diagnosis not present

## 2021-01-02 DIAGNOSIS — Z5181 Encounter for therapeutic drug level monitoring: Secondary | ICD-10-CM | POA: Diagnosis not present

## 2021-01-02 DIAGNOSIS — Z79899 Other long term (current) drug therapy: Secondary | ICD-10-CM | POA: Diagnosis not present

## 2021-01-02 DIAGNOSIS — M419 Scoliosis, unspecified: Secondary | ICD-10-CM | POA: Diagnosis not present

## 2021-01-02 DIAGNOSIS — R519 Headache, unspecified: Secondary | ICD-10-CM | POA: Diagnosis not present

## 2021-01-02 DIAGNOSIS — Z79891 Long term (current) use of opiate analgesic: Secondary | ICD-10-CM | POA: Diagnosis not present

## 2021-01-02 DIAGNOSIS — F1190 Opioid use, unspecified, uncomplicated: Secondary | ICD-10-CM | POA: Diagnosis not present

## 2021-01-29 ENCOUNTER — Telehealth: Payer: Self-pay

## 2021-01-29 NOTE — Telephone Encounter (Signed)
Prolia VOB initiated via [email or web address]  Last OV:  Next OV:  Last Prolia inj: 11/25/19 Next Prolia inj DUE: 05/26/20

## 2021-02-08 ENCOUNTER — Ambulatory Visit: Payer: Medicare HMO | Admitting: Family

## 2021-02-12 ENCOUNTER — Ambulatory Visit (INDEPENDENT_AMBULATORY_CARE_PROVIDER_SITE_OTHER): Payer: Medicare HMO | Admitting: Family

## 2021-02-12 ENCOUNTER — Ambulatory Visit: Payer: Medicare HMO

## 2021-02-12 VITALS — BP 138/65 | HR 89 | Temp 98.3°F | Resp 16 | Wt 85.4 lb

## 2021-02-12 DIAGNOSIS — I10 Essential (primary) hypertension: Secondary | ICD-10-CM

## 2021-02-12 DIAGNOSIS — M818 Other osteoporosis without current pathological fracture: Secondary | ICD-10-CM

## 2021-02-12 DIAGNOSIS — N189 Chronic kidney disease, unspecified: Secondary | ICD-10-CM

## 2021-02-12 DIAGNOSIS — J449 Chronic obstructive pulmonary disease, unspecified: Secondary | ICD-10-CM | POA: Diagnosis not present

## 2021-02-12 DIAGNOSIS — R630 Anorexia: Secondary | ICD-10-CM | POA: Diagnosis not present

## 2021-02-12 DIAGNOSIS — I509 Heart failure, unspecified: Secondary | ICD-10-CM | POA: Diagnosis not present

## 2021-02-12 DIAGNOSIS — I130 Hypertensive heart and chronic kidney disease with heart failure and stage 1 through stage 4 chronic kidney disease, or unspecified chronic kidney disease: Secondary | ICD-10-CM | POA: Diagnosis not present

## 2021-02-12 NOTE — Assessment & Plan Note (Signed)
Initial bp was elevated, follow up recheck was much better. Monitor.

## 2021-02-12 NOTE — Patient Instructions (Signed)
Please complete lab work prior to leaving. Please try to eat a larger dinner and continue your 2 ensures/day.

## 2021-02-12 NOTE — Assessment & Plan Note (Signed)
Unchanged. Her weight is up a couple of pounds from the fall. Continue ensure and plan to add some more food at dinner time rather than a snack.

## 2021-02-12 NOTE — Assessment & Plan Note (Signed)
She is due for prolia, however with the new year, our team is going to recheck pt responsibility since the last approval was in November.

## 2021-02-12 NOTE — Progress Notes (Signed)
Subjective:     Patient ID: [name] [name], female    DOB: 12-21-1925, 88 y.o.   MRN: [id number]  Chief Complaint  Patient presents with   Follow-up   Anorexia    Complains of loss of appetite   Back Pain    Complains of back pain    Back Pain  Patient is in today for follow up.  Anorexia- daughter reports that she eats at 11:30, 3:30 and sometimes a snack around 8:30 PM.  She is also using ensure 2 a day.     Wt Readings from Last 3 Encounters:  02/12/21 85 lb 6.4 oz (38.7 kg)  11/30/20 86 lb (39 kg)  11/06/20 83 lb (37.6 kg)   She continues to follow at the Pain clinic  for her chronic low back pain.   Health Maintenance Due  Topic Date Due   Zoster Vaccines- Shingrix (1 of 2) Never done   COVID-19 Vaccine (3 - Mixed Product risk series) 04/02/2019    Past Medical History:  Diagnosis Date   Abdominal pain    Resolved   Cardiac arrhythmia    CHF (congestive heart failure) (HCC)    Diastolic  on ECHO 0347   Choledocholithiasis    Chronic anemia    Chronic back pain    COPD (chronic obstructive pulmonary disease) (HCC)    oxygen dependent at  night 2LPM   Daily headache    GERD (gastroesophageal reflux disease)    HCAP (healthcare-associated pneumonia) 03/28/2014   Hiatal hernia    Hyperlipidemia    Hypertension    On home oxygen therapy    "1L during the day; 2L q hs" (03/28/2014)   Osteoarthritis    "pretty much q where"    Osteoporosis    Pancolitis ([place])    Infectious vs. inflammatory   Pancreatitis    History of   Pneumonia 1930's; 2011 X 2; 02/2014   PONV (postoperative nausea and vomiting)    PUD (peptic ulcer disease)    Reactive airway disease that is not asthma    Recurrent UTI (urinary tract infection)    "here lately" (03/28/2014)   Scoliosis deformity of spine    history of intractable back pain    Past Surgical History:  Procedure Laterality Date   ABDOMINAL HYSTERECTOMY  1970s   BACK SURGERY     x 5   BREAST CYST EXCISION Right  1960's   CERVICAL LAMINECTOMY  1970s   CHOLECYSTECTOMY  2008   ERCP W/ SPHICTEROTOMY  02/2010   ESOPHAGOGASTRODUODENOSCOPY N/A 11/14/2013   Procedure: ESOPHAGOGASTRODUODENOSCOPY (EGD);  Surgeon: Missy Sabins, MD;  Location[facility] ENDOSCOPY;  Service: Endoscopy;  Laterality: N/A;   FLEXIBLE SIGMOIDOSCOPY N/A 01/24/2013   Procedure: FLEXIBLE SIGMOIDOSCOPY;  Surgeon: Missy Sabins, MD;  Location: [place];  Service: Endoscopy;  Laterality: N/A;   PAIN PUMP IMPLANTATION  ~ 2010   back, dilaudid and bupravacaine   SPINAL CORD STIMULATOR IMPLANT  ~ 2009   SPINE SURGERY  1970's,1995,2007    Family History  Problem Relation Age of Onset   Cancer Mother        uterine   Heart disease Father    Hypertension Other    Osteoporosis Neg Hx     Social History   Socioeconomic History   Marital status: Widowed    Spouse name: Not on file   Number of children: 3   Years of education: Not on file   Highest education level: Not on file  Occupational  History    Employer: RETIRED  Tobacco Use   Smoking status: Former    Packs/day: 1.00    Years: 12.00    Pack years: 12.00    Types: Cigarettes    Quit date: 01/28/1988    Years since quitting: 33.0   Smokeless tobacco: Never  Substance and Sexual Activity   Alcohol use: No   Drug use: No   Sexual activity: Not Currently  Other Topics Concern   Not on file  Social History Narrative   1 grandchild at care link--Angelia   Lives with great-granddaughter   Social Determinants of Health   Financial Resource Strain: Not on file  Food Insecurity: Not on file  Transportation Needs: Not on file  Physical Activity: Not on file  Stress: Not on file  Social Connections: Not on file  Intimate Partner Violence: Not on file    Outpatient Medications Prior to Visit  Medication Sig Dispense Refill   hyoscyamine (LEVBID) 0.375 MG 12 hr tablet Take 0.375 mg by mouth 2 (two) times daily as needed. Stomach pain     lactulose (CHRONULAC) 10 GM/15ML  solution Take 45 mLs (30 g total) by mouth 2 (two) times daily as needed for moderate constipation. 236 mL 0   Multiple Vitamins-Minerals (MULTIVITAMIN WITH MINERALS) tablet Take 1 tablet by mouth daily. 30 tablet    omeprazole (PRILOSEC) 40 MG capsule Take 1 capsule (40 mg total) by mouth 2 (two) times daily. 60 capsule 3   oxyCODONE-acetaminophen (PERCOCET) 7.5-325 MG tablet Take 1 tablet by mouth every 8 (eight) hours as needed for severe pain.     OXYGEN Inhale 2 L into the lungs as needed.     Zoster Vaccine Adjuvanted [facility]) injection Inject 0.5mg  IM now and again in 2-6 months. 0.5 mL 1   Facility-Administered Medications Prior to Visit  Medication Dose Route Frequency Provider Last Rate Last Admin   denosumab (PROLIA) injection 60 mg  60 mg Subcutaneous Once Debbrah Alar, NP        Allergies  Allergen Reactions   Cefdinir Diarrhea   Codeine Diarrhea and Nausea And Vomiting    REACTION: n/v/d, HA   Influenza Vaccines     Stomach pain, headache   Morphine Diarrhea and Nausea And Vomiting    REACTION: n/v/d, HA   Sulfa Antibiotics Nausea Only    Sick     Zoledronic Acid Swelling    REACTION: Severe edema   Sulfasalazine Nausea Only    Sick     Review of Systems  Musculoskeletal:  Positive for back pain.     See HPI  Objective:    Physical Exam Constitutional:      General: She is not in acute distress.    Appearance: Normal appearance. She is well-developed and underweight.  HENT:     Head: Normocephalic and atraumatic.     Right Ear: External ear normal.     Left Ear: External ear normal.  Eyes:     General: No scleral icterus. Neck:     Thyroid: No thyromegaly.  Cardiovascular:     Rate and Rhythm: Normal rate and regular rhythm.     Heart sounds: Normal heart sounds. No murmur heard. Pulmonary:     Effort: Pulmonary effort is normal. No respiratory distress.     Breath sounds: Normal breath sounds. No wheezing.  Musculoskeletal:     Cervical  back: Neck supple.  Skin:    General: Skin is warm and dry.  Neurological:  Mental Status: She is alert and oriented to person, place, and time.  Psychiatric:        Mood and Affect: Mood normal.        Behavior: Behavior normal.        Thought Content: Thought content normal.        Judgment: Judgment normal.    BP 138/65    Pulse 89    Temp 98.3 F (36.8 C) (Oral)    Resp 16    Wt 85 lb 6.4 oz (38.7 kg)    SpO2 98%    BMI 17.25 kg/m  Wt Readings from Last 3 Encounters:  02/12/21 85 lb 6.4 oz (38.7 kg)  11/30/20 86 lb (39 kg)  11/06/20 83 lb (37.6 kg)       Assessment & Plan:   Problem List Items Addressed This Visit       Unprioritized   Osteoporosis    She is due for prolia, however with the new year, our team is going to recheck pt responsibility since the last approval was in November.       Essential hypertension    Initial bp was elevated, follow up recheck was much better. Monitor.       Anorexia    Unchanged. Her weight is up a couple of pounds from the fall. Continue ensure and plan to add some more food at dinner time rather than a snack.       Other Visit Diagnoses     Chronic kidney disease, unspecified CKD stage    -  Primary   Relevant Orders   Basic metabolic panel       I am having [name] maintain her multivitamin with minerals, oxyCODONE-acetaminophen, OXYGEN, omeprazole, Shingrix, hyoscyamine, and lactulose. We will continue to administer denosumab.  No orders of the defined types were placed in this encounter.

## 2021-02-25 ENCOUNTER — Other Ambulatory Visit: Payer: Self-pay | Admitting: Family

## 2021-02-25 NOTE — Telephone Encounter (Signed)
Prior auth required for PROLIA  PA PROCESS DETAILS: Prior authorization and Step Therapy are required & not on file. To initiate verbally or check status please contact [phone]. Requests can be submitted online thru [employer] or at: [email or web address]  

## 2021-02-25 NOTE — Telephone Encounter (Signed)
Prior auth initiated via [email or web address] KEY: [id number]

## 2021-02-26 ENCOUNTER — Telehealth: Payer: Self-pay | Admitting: Family

## 2021-02-26 ENCOUNTER — Other Ambulatory Visit: Payer: Self-pay | Admitting: Family

## 2021-02-26 MED ORDER — PROMETHAZINE HCL 12.5 MG PO TABS: 12.5000 mg | ORAL_TABLET | Freq: Four times a day (QID) | ORAL | 0 refills | Status: DC | PRN

## 2021-02-26 NOTE — Telephone Encounter (Signed)
Spoke to patient. She states zofran did not work for her. Rx sent for low dose phenergan. She is aware that this has more side effects than zofran.

## 2021-02-26 NOTE — Telephone Encounter (Signed)
Ms. Mechele Dawley pt's daughter contact office for med refill for Phenergan tablets.  [place], [place] - [address]  [place], [place] [address]  Phone:  [phone]  Fax:  [phone]

## 2021-02-27 NOTE — Telephone Encounter (Signed)
PA# [id number] Valid 02/26/21-01/26/22

## 2021-02-28 NOTE — Telephone Encounter (Signed)
Pt ready for scheduling on or after 02/28/21  Out-of-pocket cost due at time of visit: $296  Primary: [employer] Prolia co-insurance: 20% (approximately $276) Admin fee co-insurance: 20% (approximately $25)  Secondary: n/a Prolia co-insurance:  Admin fee co-insurance:   Deductible: does not apply  Prior Auth: APPROVED PA# [id number] Valid: 02/26/21-01/26/22  ** This summary of benefits is an estimation of the patient's out-of-pocket cost. Exact cost may vary based on individual plan coverage.

## 2021-04-13 DIAGNOSIS — Z20822 Contact with and (suspected) exposure to covid-19: Secondary | ICD-10-CM | POA: Diagnosis not present

## 2021-04-25 ENCOUNTER — Telehealth: Payer: Self-pay | Admitting: Family

## 2021-04-25 NOTE — Telephone Encounter (Signed)
Left message for patient to call back and schedule Medicare Annual Wellness Visit (AWV)  ? ? ?Last AWV 07/04/16 ? please schedule at anytime with health coach ? ?I left my direct number [phone] ?

## 2021-04-29 DIAGNOSIS — R519 Headache, unspecified: Secondary | ICD-10-CM | POA: Diagnosis not present

## 2021-04-29 DIAGNOSIS — M545 Low back pain, unspecified: Secondary | ICD-10-CM | POA: Diagnosis not present

## 2021-04-29 DIAGNOSIS — Z79899 Other long term (current) drug therapy: Secondary | ICD-10-CM | POA: Diagnosis not present

## 2021-04-29 DIAGNOSIS — Z79891 Long term (current) use of opiate analgesic: Secondary | ICD-10-CM | POA: Diagnosis not present

## 2021-04-29 DIAGNOSIS — M419 Scoliosis, unspecified: Secondary | ICD-10-CM | POA: Diagnosis not present

## 2021-04-29 DIAGNOSIS — Z978 Presence of other specified devices: Secondary | ICD-10-CM | POA: Diagnosis not present

## 2021-04-29 DIAGNOSIS — F1190 Opioid use, unspecified, uncomplicated: Secondary | ICD-10-CM | POA: Diagnosis not present

## 2021-05-07 NOTE — Telephone Encounter (Signed)
Pt's daughter called to schedule prolia shot. She was informed of the $296 out-of-pocket cost to which she stated that insurance told her that they would be covering the entire cost. Please advise. ?

## 2021-05-17 ENCOUNTER — Ambulatory Visit (INDEPENDENT_AMBULATORY_CARE_PROVIDER_SITE_OTHER): Payer: Medicare HMO | Admitting: Family

## 2021-05-17 VITALS — BP 138/76 | HR 90 | Temp 98.3°F | Resp 16 | Ht <= 58 in | Wt 88.4 lb

## 2021-05-17 DIAGNOSIS — M818 Other osteoporosis without current pathological fracture: Secondary | ICD-10-CM | POA: Diagnosis not present

## 2021-05-17 DIAGNOSIS — N189 Chronic kidney disease, unspecified: Secondary | ICD-10-CM | POA: Diagnosis not present

## 2021-05-17 DIAGNOSIS — R519 Headache, unspecified: Secondary | ICD-10-CM

## 2021-05-17 DIAGNOSIS — K219 Gastro-esophageal reflux disease without esophagitis: Secondary | ICD-10-CM | POA: Diagnosis not present

## 2021-05-17 DIAGNOSIS — M5136 Other intervertebral disc degeneration, lumbar region: Secondary | ICD-10-CM | POA: Diagnosis not present

## 2021-05-17 MED ORDER — DENOSUMAB 60 MG/ML ~~LOC~~ SOSY
60.0000 mg | PREFILLED_SYRINGE | Freq: Once | SUBCUTANEOUS | Status: AC
  Administered 2021-05-17: 60 mg via SUBCUTANEOUS

## 2021-05-17 NOTE — Assessment & Plan Note (Signed)
Stable on omeprazole. Continue same.  ?

## 2021-05-17 NOTE — Progress Notes (Signed)
? ?Subjective:  ? ?By signing my name below, I, Carylon Perches, attest that this documentation has been prepared under the direction and in the presence of Debbrah Alar NP, 05/17/2021   ? ? Patient ID: [name] [name], female    DOB: 21-Feb-1925, 88 y.o.   MRN: [id number] ? ?Chief Complaint  ?Patient presents with  ? Hypertension  ?  Here for follow up  ? Headache  ?  Daily headaches "for a long time"  ? ? ?HPI ?Patient is in today for an office visit. ? ?Headaches - She complains of headaches that occurs everyday. Symptoms begun years ago. She has seen a neurologist but symptoms persist.  ? ?Stomach Pain - Her stomach pain is improving. She is taking 40 MG of Omeprazole.  ? ?Percocet - She takes 7.5 - 325 MCG of Percocet twice a day to alleviate her back pain.  ? ?Lactulose - She is taking 10 GM/15ML of Lactulose every 2 - 3 months.  ? ?Immunizations - She has not received the COVID - 19 bivalent vaccine.  ? ?Health Maintenance Due  ?Topic Date Due  ? Zoster Vaccines- Shingrix (1 of 2) Never done  ? COVID-19 Vaccine (3 - Mixed Product risk series) 04/02/2019  ? ? ?Past Medical History:  ?Diagnosis Date  ? Abdominal pain   ? Resolved  ? Cardiac arrhythmia   ? CHF (congestive heart failure) ([facility])   ? Diastolic  on ECHO 6160  ? Choledocholithiasis   ? Chronic anemia   ? Chronic back pain   ? COPD (chronic obstructive pulmonary disease) ([facility])   ? oxygen dependent at  night 2LPM  ? Daily headache   ? GERD (gastroesophageal reflux disease)   ? HCAP (healthcare-associated pneumonia) 03/28/2014  ? Hiatal hernia   ? Hyperlipidemia   ? Hypertension   ? On home oxygen therapy   ? "1L during the day; 2L q hs" (03/28/2014)  ? Osteoarthritis   ? "pretty much q where"   ? Osteoporosis   ? Pancolitis ([place])   ? Infectious vs. inflammatory  ? Pancreatitis   ? History of  ? Pneumonia 1930's; 2011 X 2; 02/2014  ? PONV (postoperative nausea and vomiting)   ? PUD (peptic ulcer disease)   ? Reactive airway disease that is not asthma   ?  Recurrent UTI (urinary tract infection)   ? "here lately" (03/28/2014)  ? Scoliosis deformity of spine   ? history of intractable back pain  ? ? ?Past Surgical History:  ?Procedure Laterality Date  ? ABDOMINAL HYSTERECTOMY  1970s  ? BACK SURGERY    ? x 5  ? BREAST CYST EXCISION Right 1960's  ? CERVICAL LAMINECTOMY  1970s  ? CHOLECYSTECTOMY  2008  ? ERCP W/ SPHICTEROTOMY  02/2010  ? ESOPHAGOGASTRODUODENOSCOPY N/A 11/14/2013  ? Procedure: ESOPHAGOGASTRODUODENOSCOPY (EGD);  Surgeon: Missy Sabins, MD;  Location: [facility] ENDOSCOPY;  Service: Endoscopy;  Laterality: N/A;  ? FLEXIBLE SIGMOIDOSCOPY N/A 01/24/2013  ? Procedure: FLEXIBLE SIGMOIDOSCOPY;  Surgeon: Missy Sabins, MD;  Location: [facility];  Service: Endoscopy;  Laterality: N/A;  ? PAIN PUMP IMPLANTATION  ~ 2010  ? back, dilaudid and bupravacaine  ? SPINAL CORD STIMULATOR IMPLANT  ~ 2009  ? SPINE SURGERY  [phone]  ? ? ?Family History  ?Problem Relation Age of Onset  ? Cancer Mother   ?     uterine  ? Heart disease Father   ? Hypertension Other   ? Osteoporosis Neg Hx   ? ? ?Social History  ? ?  Socioeconomic History  ? Marital status: Widowed  ?  Spouse name: Not on file  ? Number of children: 3  ? Years of education: Not on file  ? Highest education level: Not on file  ?Occupational History  ?  Employer: RETIRED  ?Tobacco Use  ? Smoking status: Former  ?  Packs/day: 1.00  ?  Years: 12.00  ?  Pack years: 12.00  ?  Types: Cigarettes  ?  Quit date: 01/28/1988  ?  Years since quitting: 33.3  ? Smokeless tobacco: Never  ?Substance and Sexual Activity  ? Alcohol use: No  ? Drug use: No  ? Sexual activity: Not Currently  ?Other Topics Concern  ? Not on file  ?Social History Narrative  ? 1 grandchild at care link--[place]  ? Lives with great-granddaughter  ? ?Social Determinants of Health  ? ?Financial Resource Strain: Not on file  ?Food Insecurity: Not on file  ?Transportation Needs: Not on file  ?Physical Activity: Not on file  ?Stress: Not on file  ?Social  Connections: Not on file  ?Intimate Partner Violence: Not on file  ? ? ?Outpatient Medications Prior to Visit  ?Medication Sig Dispense Refill  ? hyoscyamine (LEVBID) 0.375 MG 12 hr tablet Take 0.375 mg by mouth 2 (two) times daily as needed. Stomach pain    ? lactulose (CHRONULAC) 10 GM/15ML solution Take 45 mLs (30 g total) by mouth 2 (two) times daily as needed for moderate constipation. 236 mL 0  ? Multiple Vitamins-Minerals (MULTIVITAMIN WITH MINERALS) tablet Take 1 tablet by mouth daily. 30 tablet   ? omeprazole (PRILOSEC) 40 MG capsule Take 1 capsule (40 mg total) by mouth 2 (two) times daily. 60 capsule 3  ? oxyCODONE-acetaminophen (PERCOCET) 7.5-325 MG tablet Take 1 tablet by mouth every 8 (eight) hours as needed for severe pain.    ? OXYGEN Inhale 2 L into the lungs as needed.    ? promethazine (PHENERGAN) 12.5 MG tablet Take 1 tablet (12.5 mg total) by mouth every 6 (six) hours as needed for nausea or vomiting. 30 tablet 0  ? Zoster Vaccine Adjuvanted [facility]) injection Inject 0.5mg  IM now and again in 2-6 months. 0.5 mL 1  ? ?Facility-Administered Medications Prior to Visit  ?Medication Dose Route Frequency Provider Last Rate Last Admin  ? denosumab (PROLIA) injection 60 mg  60 mg Subcutaneous Once Debbrah Alar, NP      ? ? ?Allergies  ?Allergen Reactions  ? Cefdinir Diarrhea  ? Codeine Diarrhea and Nausea And Vomiting  ?  REACTION: n/v/d, HA  ? Influenza Vaccines   ?  Stomach pain, headache  ? Morphine Diarrhea and Nausea And Vomiting  ?  REACTION: n/v/d, HA  ? Sulfa Antibiotics Nausea Only  ?  Sick  ?  ? Zoledronic Acid Swelling  ?  REACTION: Severe edema  ? Sulfasalazine Nausea Only  ?  Sick   ? ? ?Review of Systems  ?Cardiovascular:  Negative for leg swelling.  ?Neurological:  Positive for headaches.  ? ?   ?Objective:  ?  ?Physical Exam ?Constitutional:   ?   General: She is not in acute distress. ?   Appearance: Normal appearance. She is not ill-appearing.  ?HENT:  ?   Head: Normocephalic  and atraumatic.  ?   Right Ear: External ear normal.  ?   Left Ear: External ear normal.  ?Eyes:  ?   Extraocular Movements: Extraocular movements intact.  ?   Pupils: Pupils are equal, round, and reactive to light.  ?  Cardiovascular:  ?   Rate and Rhythm: Normal rate and regular rhythm.  ?   Heart sounds: Normal heart sounds. No murmur heard. ?  No gallop.  ?Pulmonary:  ?   Effort: Pulmonary effort is normal. No respiratory distress.  ?   Breath sounds: Normal breath sounds. No wheezing or rales.  ?Musculoskeletal:     ?   General: No swelling.  ?Skin: ?   General: Skin is warm and dry.  ?Neurological:  ?   Mental Status: She is alert and oriented to person, place, and time.  ?Psychiatric:     ?   Mood and Affect: Mood normal.     ?   Behavior: Behavior normal.     ?   Judgment: Judgment normal.  ? ? ?BP 138/76 (BP Location: Left Arm, Patient Position: Sitting, Cuff Size: Small)   Pulse 90   Temp 98.3 ?F (36.8 ?C) (Oral)   Resp 16   Ht $R'4\' 10"'LJ$  (1.473 m)   Wt 88 lb 6.4 oz (40.1 kg)   SpO2 96%   BMI 18.48 kg/m?  ?Wt Readings from Last 3 Encounters:  ?05/17/21 88 lb 6.4 oz (40.1 kg)  ?02/12/21 85 lb 6.4 oz (38.7 kg)  ?11/30/20 86 lb (39 kg)  ? ? ?   ?Assessment & Plan:  ? ?Problem List Items Addressed This Visit   ? ?  ? Unprioritized  ? Osteoporosis  ?  Prolia injection today. Continue every 6 months.  ? ?  ?  ? Headache  ?  Chronic, lifelong, unchanged. She has seen neuro in the past and declines referral at this time. Monitor.  ? ?  ?  ? GERD  ?  Stable on omeprazole. Continue same.  ? ?  ?  ? DDD (degenerative disc disease), lumbar  ?  Patient continues percocet prn. She continues to find good relief with percocet. This is prescribed by her pain clinic.  ? ?  ?  ? ?Other Visit Diagnoses   ? ? Chronic kidney disease, unspecified CKD stage    -  Primary  ? Relevant Orders  ? Comp Met (CMET) (Completed)  ? ?  ? ? ? ? ?Meds ordered this encounter  ?Medications  ? denosumab (PROLIA) injection 60 mg  ?  Order  Specific Question:   Patient is enrolled in [employer] program for this medication and I have provided a copy of the Prolia Medication Guide and Patient Brochure.  ?  Answer:   No  ?  Order Specific Question:   I have reviewed with

## 2021-05-18 LAB — COMPREHENSIVE METABOLIC PANEL
AG Ratio: 1.4 (calc) (ref 1.0–2.5)
ALT: 6 U/L (ref 6–29)
AST: 16 U/L (ref 10–35)
Albumin: 3.8 g/dL (ref 3.6–5.1)
Alkaline phosphatase (APISO): 65 U/L (ref 37–153)
BUN/Creatinine Ratio: 31 (calc) — ABNORMAL HIGH (ref 6–22)
BUN: 33 mg/dL — ABNORMAL HIGH (ref 7–25)
CO2: 27 mmol/L (ref 20–32)
Calcium: 9.1 mg/dL (ref 8.6–10.4)
Chloride: 106 mmol/L (ref 98–110)
Creat: 1.07 mg/dL — ABNORMAL HIGH (ref 0.60–0.95)
Globulin: 2.7 g/dL (calc) (ref 1.9–3.7)
Glucose, Bld: 77 mg/dL (ref 65–99)
Potassium: 5 mmol/L (ref 3.5–5.3)
Sodium: 143 mmol/L (ref 135–146)
Total Bilirubin: 0.2 mg/dL (ref 0.2–1.2)
Total Protein: 6.5 g/dL (ref 6.1–8.1)

## 2021-05-18 NOTE — Telephone Encounter (Signed)
Last Prolia inj 05/17/21 ?Next Prolia inj due 11/17/21 ?

## 2021-05-19 NOTE — Assessment & Plan Note (Signed)
Chronic, lifelong, unchanged. She has seen neuro in the past and declines referral at this time. Monitor.  ?

## 2021-05-19 NOTE — Assessment & Plan Note (Signed)
Patient continues percocet prn. She continues to find good relief with percocet. This is prescribed by her pain clinic.  ?

## 2021-05-19 NOTE — Assessment & Plan Note (Signed)
Prolia injection today. Continue every 6 months.  ?

## 2021-05-20 DIAGNOSIS — Z20822 Contact with and (suspected) exposure to covid-19: Secondary | ICD-10-CM | POA: Diagnosis not present

## 2021-07-03 DIAGNOSIS — Z978 Presence of other specified devices: Secondary | ICD-10-CM | POA: Diagnosis not present

## 2021-07-03 DIAGNOSIS — Z5181 Encounter for therapeutic drug level monitoring: Secondary | ICD-10-CM | POA: Diagnosis not present

## 2021-07-03 DIAGNOSIS — Z79899 Other long term (current) drug therapy: Secondary | ICD-10-CM | POA: Diagnosis not present

## 2021-07-03 DIAGNOSIS — F1190 Opioid use, unspecified, uncomplicated: Secondary | ICD-10-CM | POA: Diagnosis not present

## 2021-07-03 DIAGNOSIS — G8929 Other chronic pain: Secondary | ICD-10-CM | POA: Diagnosis not present

## 2021-07-03 DIAGNOSIS — M545 Low back pain, unspecified: Secondary | ICD-10-CM | POA: Diagnosis not present

## 2021-07-03 DIAGNOSIS — R519 Headache, unspecified: Secondary | ICD-10-CM | POA: Diagnosis not present

## 2021-07-03 DIAGNOSIS — Z79891 Long term (current) use of opiate analgesic: Secondary | ICD-10-CM | POA: Diagnosis not present

## 2021-07-03 DIAGNOSIS — M419 Scoliosis, unspecified: Secondary | ICD-10-CM | POA: Diagnosis not present

## 2021-07-09 DIAGNOSIS — L821 Other seborrheic keratosis: Secondary | ICD-10-CM | POA: Diagnosis not present

## 2021-07-09 DIAGNOSIS — Z86007 Personal history of in-situ neoplasm of skin: Secondary | ICD-10-CM | POA: Diagnosis not present

## 2021-07-09 DIAGNOSIS — Z08 Encounter for follow-up examination after completed treatment for malignant neoplasm: Secondary | ICD-10-CM | POA: Diagnosis not present

## 2021-08-23 ENCOUNTER — Telehealth: Payer: Self-pay | Admitting: Family

## 2021-08-23 ENCOUNTER — Other Ambulatory Visit: Payer: Self-pay

## 2021-08-23 MED ORDER — PROMETHAZINE HCL 12.5 MG PO TABS: 12.5000 mg | ORAL_TABLET | Freq: Four times a day (QID) | ORAL | 0 refills | Status: AC | PRN

## 2021-08-23 NOTE — Telephone Encounter (Signed)
Medication:   promethazine (PHENERGAN) 12.5 MG tablet [[id number]]   Has the patient contacted their pharmacy? No. (If no, request that the patient contact the pharmacy for the refill.) (If yes, when and what did the pharmacy advise?)  Preferred Pharmacy (with phone number or street name):   [facility], [place] - [address]  [place], [place] [address]  Phone:  [phone]  Fax:  [phone]   Agent: Please be advised that RX refills may take up to 3 business days. We ask that you follow-up with your pharmacy.

## 2021-09-24 ENCOUNTER — Ambulatory Visit (INDEPENDENT_AMBULATORY_CARE_PROVIDER_SITE_OTHER): Payer: Medicare HMO

## 2021-09-24 VITALS — Ht <= 58 in | Wt 88.0 lb

## 2021-09-24 DIAGNOSIS — Z0000 Encounter for general adult medical examination without abnormal findings: Secondary | ICD-10-CM

## 2021-09-24 NOTE — Patient Instructions (Addendum)
[name] , Thank you for taking time to come for your Medicare Wellness Visit. I appreciate your ongoing commitment to your health goals. Please review the following plan we discussed and let me know if I can assist you in the future.   These are the goals we discussed:  Goals      Continue quilting        This is a list of the screening recommended for you and due dates:  Health Maintenance  Topic Date Due   COVID-19 Vaccine (3 - Moderna risk series) 10/10/2021*   Zoster (Shingles) Vaccine (1 of 2) 12/25/2021*   Tetanus Vaccine  06/28/2025   Pneumonia Vaccine  Completed   DEXA scan (bone density measurement)  Completed   HPV Vaccine  Aged Out   Flu Shot  Discontinued  *Topic was postponed. The date shown is not the original due date.  Opioid Pain Medicine Management Opioids are powerful medicines that are used to treat moderate to severe pain. When used for short periods of time, they can help you to: Sleep better. Do better in physical or occupational therapy. Feel better in the first few days after an injury. Recover from surgery. Opioids should be taken with the supervision of a trained health care provider. They should be taken for the shortest period of time possible. This is because opioids can be addictive, and the longer you take opioids, the greater your risk of addiction. This addiction can also be called opioid use disorder. What are the risks? Using opioid pain medicines for longer than 3 days increases your risk of side effects. Side effects include: Constipation. Nausea and vomiting. Breathing difficulties (respiratory depression). Drowsiness. Confusion. Opioid use disorder. Itching. Taking opioid pain medicine for a long period of time can affect your ability to do daily tasks. It also puts you at risk for: Motor vehicle crashes. Depression. Suicide. Heart attack. Overdose, which can be life-threatening. What is a pain treatment plan? A pain treatment  plan is an agreement between you and your health care provider. Pain is unique to each person, and treatments vary depending on your condition. To manage your pain, you and your health care provider need to work together. To help you do this: Discuss the goals of your treatment, including how much pain you might expect to have and how you will manage the pain. Review the risks and benefits of taking opioid medicines. Remember that a good treatment plan uses more than one approach and minimizes the chance of side effects. Be honest about the amount of medicines you take and about any drug or alcohol use. Get pain medicine prescriptions from only one health care provider. Pain can be managed with many types of alternative treatments. Ask your health care provider to refer you to one or more specialists who can help you manage pain through: Physical or occupational therapy. Counseling (cognitive behavioral therapy). Good nutrition. Biofeedback. Massage. Meditation. Non-opioid medicine. Following a gentle exercise program. How to use opioid pain medicine Taking medicine Take your pain medicine exactly as told by your health care provider. Take it only when you need it. If your pain gets less severe, you may take less than your prescribed dose if your health care provider approves. If you are not having pain, do nottake pain medicine unless your health care provider tells you to take it. If your pain is severe, do nottry to treat it yourself by taking more pills than instructed on your prescription. Contact your health care provider  for help. Write down the times when you take your pain medicine. It is easy to become confused while on pain medicine. Writing the time can help you avoid overdose. Take other over-the-counter or prescription medicines only as told by your health care provider. Keeping yourself and others safe  While you are taking opioid pain medicine: Do not drive, use machinery,  or power tools. Do not sign legal documents. Do not drink alcohol. Do not take sleeping pills. Do not supervise children by yourself. Do not do activities that require climbing or being in high places. Do not go to a lake, river, ocean, spa, or swimming pool. Do not share your pain medicine with anyone. Keep pain medicine in a locked cabinet or in a secure area where pets and children cannot reach it. Stopping your use of opioids If you have been taking opioid medicine for more than a few weeks, you may need to slowly decrease (taper) how much you take until you stop completely. Tapering your use of opioids can decrease your risk of symptoms of withdrawal, such as: Pain and cramping in the abdomen. Nausea. Sweating. Sleepiness. Restlessness. Uncontrollable shaking (tremors). Cravings for the medicine. Do not attempt to taper your use of opioids on your own. Talk with your health care provider about how to do this. Your health care provider may prescribe a step-down schedule based on how much medicine you are taking and how long you have been taking it. Getting rid of leftover pills Do not save any leftover pills. Get rid of leftover pills safely by: Taking the medicine to a prescription take-back program. This is usually offered by the county or law enforcement. Bringing them to a pharmacy that has a drug disposal container. Flushing them down the toilet. Check the label or package insert of your medicine to see whether this is safe to do. Throwing them out in the trash. Check the label or package insert of your medicine to see whether this is safe to do. If it is safe to throw it out, remove the medicine from the original container, put it into a sealable bag or container, and mix it with used coffee grounds, food scraps, dirt, or cat litter before putting it in the trash. Follow these instructions at home: Activity Do exercises as told by your health care provider. Avoid activities  that make your pain worse. Return to your normal activities as told by your health care provider. Ask your health care provider what activities are safe for you. General instructions You may need to take these actions to prevent or treat constipation: Drink enough fluid to keep your urine pale yellow. Take over-the-counter or prescription medicines. Eat foods that are high in fiber, such as beans, whole grains, and fresh fruits and vegetables. Limit foods that are high in fat and processed sugars, such as fried or sweet foods. Keep all follow-up visits. This is important. Where to find support If you have been taking opioids for a long time, you may benefit from receiving support for quitting from a local support group or counselor. Ask your health care provider for a referral to these resources in your area. Where to find more information Centers for Disease Control and Prevention (CDC): http://www.wolf.info/ [place] Food and Drug Administration (FDA): [employer] Get help right away if: You may have taken too much of an opioid (overdosed). Common symptoms of an overdose: Your breathing is slower or more shallow than normal. You have a very slow heartbeat (pulse). You have slurred  speech. You have nausea and vomiting. Your pupils become very small. You have other potential symptoms: You are very confused. You faint or feel like you will faint. You have cold, clammy skin. You have blue lips or fingernails. You have thoughts of harming yourself or harming others. These symptoms may represent a serious problem that is an emergency. Do not wait to see if the symptoms will go away. Get medical help right away. Call your local emergency services (911 in the [place] Do not drive yourself to the hospital.  If you ever feel like you may hurt yourself or others, or have thoughts about taking your own life, get help right away. Go to your nearest emergency department or: Call your local emergency services (911  in the [place] Call the [facility] (412) 653-5424 in the [place] Call a suicide crisis helpline, such as the [address] at (651)211-8354 or 988 in the [place]. This is open 24 hours a day in the [place] Text the Crisis Text Line at 725-096-0634 (in the [place].). Summary Opioid medicines can help you manage moderate to severe pain for a short period of time. A pain treatment plan is an agreement between you and your health care provider. Discuss the goals of your treatment, including how much pain you might expect to have and how you will manage the pain. If you think that you or someone else may have taken too much of an opioid, get medical help right away. This information is not intended to replace advice given to you by your health care provider. Make sure you discuss any questions you have with your health care provider. Document Revised: 08/08/2020 Document Reviewed: 04/25/2020 Elsevier Patient Education  [place] 65 Years and Older, Female Preventive care refers to lifestyle choices and visits with your health care provider that can promote health and wellness. What does preventive care include? A yearly physical exam. This is also called an annual well check. Dental exams once or twice a year. Routine eye exams. Ask your health care provider how often you should have your eyes checked. Personal lifestyle choices, including: Daily care of your teeth and gums. Regular physical activity. Eating a healthy diet. Avoiding tobacco and drug use. Limiting alcohol use. Practicing safe sex. Taking low-dose aspirin every day. Taking vitamin and mineral supplements as recommended by your health care provider. What happens during an annual well check? The services and screenings done by your health care provider during your annual well check will depend on your age, overall health, lifestyle risk factors, and family history of  disease. Counseling  Your health care provider may ask you questions about your: Alcohol use. Tobacco use. Drug use. Emotional well-being. Home and relationship well-being. Sexual activity. Eating habits. History of falls. Memory and ability to understand (cognition). Work and work Statistician. Reproductive health. Screening  You may have the following tests or measurements: Height, weight, and BMI. Blood pressure. Lipid and cholesterol levels. These may be checked every 5 years, or more frequently if you are over 31 years old. Skin check. Lung cancer screening. You may have this screening every year starting at age 55 if you have a 30-pack-year history of smoking and currently smoke or have quit within the past 15 years. Fecal occult blood test (FOBT) of the stool. You may have this test every year starting at age 12. Flexible sigmoidoscopy or colonoscopy. You may have a sigmoidoscopy every 5 years or a colonoscopy every 10  years starting at age 79. Hepatitis C blood test. Hepatitis B blood test. Sexually transmitted disease (STD) testing. Diabetes screening. This is done by checking your blood sugar (glucose) after you have not eaten for a while (fasting). You may have this done every 1-3 years. Bone density scan. This is done to screen for osteoporosis. You may have this done starting at age 37. Mammogram. This may be done every 1-2 years. Talk to your health care provider about how often you should have regular mammograms. Talk with your health care provider about your test results, treatment options, and if necessary, the need for more tests. Vaccines  Your health care provider may recommend certain vaccines, such as: Influenza vaccine. This is recommended every year. Tetanus, diphtheria, and acellular pertussis (Tdap, Td) vaccine. You may need a Td booster every 10 years. Zoster vaccine. You may need this after age 46. Pneumococcal 13-valent conjugate (PCV13) vaccine. One  dose is recommended after age 61. Pneumococcal polysaccharide (PPSV23) vaccine. One dose is recommended after age 95. Talk to your health care provider about which screenings and vaccines you need and how often you need them. This information is not intended to replace advice given to you by your health care provider. Make sure you discuss any questions you have with your health care provider. Document Released: 02/09/2015 Document Revised: 10/03/2015 Document Reviewed: 11/14/2014 Elsevier Interactive Patient Education  2017 [place] Prevention in the Home Falls can cause injuries. They can happen to people of all ages. There are many things you can do to make your home safe and to help prevent falls. What can I do on the outside of my home? Regularly fix the edges of walkways and driveways and fix any cracks. Remove anything that might make you trip as you walk through a door, such as a raised step or threshold. Trim any bushes or trees on the path to your home. Use bright outdoor lighting. Clear any walking paths of anything that might make someone trip, such as rocks or tools. Regularly check to see if handrails are loose or broken. Make sure that both sides of any steps have handrails. Any raised decks and porches should have guardrails on the edges. Have any leaves, snow, or ice cleared regularly. Use sand or salt on walking paths during winter. Clean up any spills in your garage right away. This includes oil or grease spills. What can I do in the bathroom? Use night lights. Install grab bars by the toilet and in the tub and shower. Do not use towel bars as grab bars. Use non-skid mats or decals in the tub or shower. If you need to sit down in the shower, use a plastic, non-slip stool. Keep the floor dry. Clean up any water that spills on the floor as soon as it happens. Remove soap buildup in the tub or shower regularly. Attach bath mats securely with double-sided  non-slip rug tape. Do not have throw rugs and other things on the floor that can make you trip. What can I do in the bedroom? Use night lights. Make sure that you have a light by your bed that is easy to reach. Do not use any sheets or blankets that are too big for your bed. They should not hang down onto the floor. Have a firm chair that has side arms. You can use this for support while you get dressed. Do not have throw rugs and other things on the floor that can make you trip.  What can I do in the kitchen? Clean up any spills right away. Avoid walking on wet floors. Keep items that you use a lot in easy-to-reach places. If you need to reach something above you, use a strong step stool that has a grab bar. Keep electrical cords out of the way. Do not use floor polish or wax that makes floors slippery. If you must use wax, use non-skid floor wax. Do not have throw rugs and other things on the floor that can make you trip. What can I do with my stairs? Do not leave any items on the stairs. Make sure that there are handrails on both sides of the stairs and use them. Fix handrails that are broken or loose. Make sure that handrails are as long as the stairways. Check any carpeting to make sure that it is firmly attached to the stairs. Fix any carpet that is loose or worn. Avoid having throw rugs at the top or bottom of the stairs. If you do have throw rugs, attach them to the floor with carpet tape. Make sure that you have a light switch at the top of the stairs and the bottom of the stairs. If you do not have them, ask someone to add them for you. What else can I do to help prevent falls? Wear shoes that: Do not have high heels. Have rubber bottoms. Are comfortable and fit you well. Are closed at the toe. Do not wear sandals. If you use a stepladder: Make sure that it is fully opened. Do not climb a closed stepladder. Make sure that both sides of the stepladder are locked into place. Ask  someone to hold it for you, if possible. Clearly mark and make sure that you can see: Any grab bars or handrails. First and last steps. Where the edge of each step is. Use tools that help you move around (mobility aids) if they are needed. These include: Canes. Walkers. Scooters. Crutches. Turn on the lights when you go into a dark area. Replace any light bulbs as soon as they burn out. Set up your furniture so you have a clear path. Avoid moving your furniture around. If any of your floors are uneven, fix them. If there are any pets around you, be aware of where they are. Review your medicines with your doctor. Some medicines can make you feel dizzy. This can increase your chance of falling. Ask your doctor what other things that you can do to help prevent falls. This information is not intended to replace advice given to you by your health care provider. Make sure you discuss any questions you have with your health care provider. Document Released: 11/09/2008 Document Revised: 06/21/2015 Document Reviewed: 02/17/2014 Elsevier Interactive Patient Education  2017 [employer].

## 2021-09-24 NOTE — Progress Notes (Signed)
Subjective:   [name] [name] is a 88 y.o. female who presents for Medicare Annual (Subsequent) preventive examination.  Review of Systems    No ROS.  Medicare Wellness Virtual Visit.  Visual/audio telehealth visit, UTA vital signs.   See social history for additional risk factors.   Cardiac Risk Factors include: advanced age (>75mn, >>69women);hypertension     Objective:    Today's Vitals   09/24/21 1502  Weight: 88 lb (39.9 kg)  Height: '4\' 10"'$  (1.473 m)   Body mass index is 18.39 kg/m.     09/24/2021    3:09 PM 09/29/2020    3:47 PM 09/27/2020    9:10 PM 05/07/2018    1:03 PM 04/06/2018    7:22 PM 04/06/2018    1:41 PM 02/04/2018    7:50 PM  Advanced Directives  Does Patient Have a Medical Advance Directive? No  No No Yes No;Yes No  Type of Advance Directive     [place]  Does patient want to make changes to medical advance directive?     No - Patient declined    [place] of [place]?     No - copy requested    Would patient like information on creating a medical advance directive? No - Patient declined No - Patient declined  No - Patient declined   No - Patient declined    Current Medications (verified) Outpatient Encounter Medications as of 09/24/2021  Medication Sig   hyoscyamine (LEVBID) 0.375 MG 12 hr tablet Take 0.375 mg by mouth 2 (two) times daily as needed. Stomach pain   lactulose (CHRONULAC) 10 GM/15ML solution Take 45 mLs (30 g total) by mouth 2 (two) times daily as needed for moderate constipation.   Multiple Vitamins-Minerals (MULTIVITAMIN WITH MINERALS) tablet Take 1 tablet by mouth daily.   omeprazole (PRILOSEC) 40 MG capsule Take 1 capsule (40 mg total) by mouth 2 (two) times daily.   oxyCODONE-acetaminophen (PERCOCET) 7.5-325 MG tablet Take 1 tablet by mouth every 8 (eight) hours as needed for severe pain.   OXYGEN Inhale 2 L into the lungs as needed.   promethazine  (PHENERGAN) 12.5 MG tablet Take 1 tablet (12.5 mg total) by mouth every 6 (six) hours as needed for nausea or vomiting.   Zoster Vaccine Adjuvanted ([facility] injection Inject 0.'5mg'$  IM now and again in 2-6 months.   Facility-Administered Encounter Medications as of 09/24/2021  Medication   denosumab (PROLIA) injection 60 mg    Allergies (verified) Cefdinir, Codeine, Influenza vaccines, Morphine, Sulfa antibiotics, Zoledronic acid, and Sulfasalazine   History: Past Medical History:  Diagnosis Date   Abdominal pain    Resolved   Cardiac arrhythmia    CHF (congestive heart failure) (HCC)    Diastolic  on ECHO 21740  Choledocholithiasis    Chronic anemia    Chronic back pain    COPD (chronic obstructive pulmonary disease) (HCC)    oxygen dependent at  night 2LPM   Daily headache    GERD (gastroesophageal reflux disease)    HCAP (healthcare-associated pneumonia) 03/28/2014   Hiatal hernia    Hyperlipidemia    Hypertension    On home oxygen therapy    "1L during the day; 2L q hs" (03/28/2014)   Osteoarthritis    "pretty much q where"    Osteoporosis    [place]    Infectious vs. inflammatory   Pancreatitis    History of  Pneumonia 1930's; 2011 X 2; 02/2014   PONV (postoperative nausea and vomiting)    PUD (peptic ulcer disease)    Reactive airway disease that is not asthma    Recurrent UTI (urinary tract infection)    "here lately" (03/28/2014)   Scoliosis deformity of spine    history of intractable back pain   Past Surgical History:  Procedure Laterality Date   ABDOMINAL HYSTERECTOMY  1970s   BACK SURGERY     x 5   BREAST CYST EXCISION Right 1960's   CERVICAL LAMINECTOMY  1970s   CHOLECYSTECTOMY  2008   ERCP W/ SPHICTEROTOMY  02/2010   ESOPHAGOGASTRODUODENOSCOPY N/A 11/14/2013   Procedure: ESOPHAGOGASTRODUODENOSCOPY (EGD);  Surgeon: Missy Sabins, MD;  [facility] ENDOSCOPY;  Service: Endoscopy;  Laterality: N/A;   FLEXIBLE SIGMOIDOSCOPY N/A 01/24/2013    Procedure: FLEXIBLE SIGMOIDOSCOPY;  Surgeon: Missy Sabins, MD;  Location: [place];  Service: Endoscopy;  Laterality: N/A;   PAIN PUMP IMPLANTATION  ~ 2010   back, dilaudid and bupravacaine   SPINAL CORD STIMULATOR IMPLANT  ~ 2009   SPINE SURGERY  1970's,1995,2007   Family History  Problem Relation Age of Onset   Cancer Mother        uterine   Heart disease Father    Hypertension Other    Osteoporosis Neg Hx    Social History   Socioeconomic History   Marital status: Widowed    Spouse name: Not on file   Number of children: 3   Years of education: Not on file   Highest education level: Not on file  Occupational History    Employer: RETIRED  Tobacco Use   Smoking status: Former    Packs/day: 1.00    Years: 12.00    Total pack years: 12.00    Types: Cigarettes    Quit date: 01/28/1988    Years since quitting: 33.6   Smokeless tobacco: Never  Substance and Sexual Activity   Alcohol use: No   Drug use: No   Sexual activity: Not Currently  Other Topics Concern   Not on file  Social History Narrative   1 grandchild at care link--Angelia   Lives with great-granddaughter   Social Determinants of Health   Financial Resource Strain: Low Risk  (09/24/2021)   Overall Financial Resource Strain (CARDIA)    Difficulty of Paying Living Expenses: Not hard at all  Food Insecurity: No Food Insecurity (09/24/2021)   Hunger Vital Sign    Worried About Running Out of Food in the Last Year: Never true    [place] in the Last Year: Never true  Transportation Needs: No Transportation Needs (09/24/2021)   PRAPARE - Hydrologist (Medical): No    Lack of Transportation (Non-Medical): No  Physical Activity: Unknown (09/24/2021)   Exercise Vital Sign    Days of Exercise per Week: 0 days    Minutes of Exercise per Session: Not on file  Stress: No Stress Concern Present (09/24/2021)   [place]    Feeling of Stress : Not at all  Social Connections: Unknown (09/24/2021)   Social Connection and Isolation Panel [NHANES]    Frequency of Communication with Friends and Family: Not on file    Frequency of Social Gatherings with Friends and Family: More than three times a week    Attends Religious Services: Not on file    Active Member of Clubs or Organizations: Not on  file    Attends Archivist Meetings: Not on file    Marital Status: Not on file    Tobacco Counseling Counseling given: Not Answered   Clinical Intake:  Pre-visit preparation completed: Yes        Diabetes: No  How often do you need to have someone help you when you read instructions, pamphlets, or other written materials from your doctor or pharmacy?: 1 - Never   Interpreter Needed?: No      Activities of Daily Living    09/24/2021    3:19 PM 09/29/2020    3:47 PM  In your present state of health, do you have any difficulty performing the following activities:  Hearing? 1 1  Comment Hearing aids sometimes   Vision? 0 0  Difficulty concentrating or making decisions? 0 1  Comment Age appropriate. Some long/short term memory.   Walking or climbing stairs? 1 0  Comment Cane in use   Dressing or bathing? 0 1  Doing errands, shopping? 1   Comment Family Diplomatic Services operational officer and eating ? Y   Comment Daughter meal preps. Self feeds.   Using the Toilet? N   In the past six months, have you accidently leaked urine? N   Do you have problems with loss of bowel control? N   Managing your Medications? Y   Comment Daughter assist   Managing your Finances? Y   Comment Daughter assist   Housekeeping or managing your Housekeeping? Y   Comment Daughter assist     Patient Care Team: Debbrah Alar, NP as PCP - General (Internal Medicine) Rigoberto Noel, MD as Consulting Physician (Pulmonary Disease) [facility] Cl[facility], MD as Consulting Physician  (Gastroenterology) Willow Ora, OD as Consulting Physician (Optometry)  Indicate any recent Medical Services you may have received from other than [facility] providers in the past year (date may be approximate).     Assessment:   This is a routine wellness examination for Ariany.  Virtual Visit via Telephone Note  I connected with  [name] [name] on 09/24/21 at  3:00 PM EDT by telephone and verified that I am speaking with the correct person using two identifiers.  Location: Patient: home Provider: office Persons participating in the virtual visit: patient/daughter/Nurse Health Advisor   I discussed the limitations of performing an evaluation and management service by telehealth. We continued and completed visit with audio only. Some vital signs may be absent or patient reported.   Hearing/Vision screen Hearing Screening - Comments:: Able to hear conversational tones, some difficulty. Wears bilateral hearing aids sometimes. Follows w/ [facility]. Vision Screening - Comments:: Follows w/ Dr. Willow Ora Wears readers sometime  Dietary issues and exercise activities discussed: Current Exercise Habits: The patient does not participate in regular exercise at present Regular diet Good water intake   Goals Addressed             This Visit's Progress    Continue quilting   On track      Depression Screen    09/24/2021    3:08 PM 02/12/2021    2:11 PM 07/27/2019    2:26 PM 07/04/2016    2:26 PM 09/19/2015    3:34 PM 12/13/2014    1:48 PM 09/28/2012    1:55 PM  PHQ 2/9 Scores  PHQ - 2 Score 0 0 0 0 0 0 0    Fall Risk    09/24/2021    3:18 PM 02/12/2021    2:11  PM 07/27/2019    2:27 PM 08/18/2016   12:54 PM 07/04/2016    2:26 PM  Fall Risk   Falls in the past year? 1 0 0 No No  Comment    Emmi Telephone Survey: data to providers prior to load   Number falls in past yr: 0 0 0    Injury with Fall? 0 0 0    Follow up Falls evaluation completed        FALL RISK PREVENTION PERTAINING  TO THE HOME: Home free of loose throw rugs in walkways, pet beds, electrical cords, etc? Yes  Adequate lighting in your home to reduce risk of falls? Yes   ASSISTIVE DEVICES UTILIZED TO PREVENT FALLS: Life alert? Yes  Use of a cane, walker or w/c? Yes  Grab bars in the bathroom? Yes  Shower chair or bench in shower? Yes  Elevated toilet seat or a handicapped toilet? Yes   TIMED UP AND GO: Was the test performed? No .   Cognitive Function: Patient is alert.     07/04/2016    2:26 PM  MMSE - Mini Mental State Exam  Orientation to time 4  Orientation to time comments Disoriented to date.  Orientation to Place 5  Registration 3  Attention/ Calculation 1  Attention/Calculation-comments "I can't do that."  Recall 1  Language- name 2 objects 2  Language- repeat 1  Language- follow 3 step command 3  Language- read & follow direction 1  Write a sentence 1  Copy design 1  Total score 23        09/24/2021    3:40 PM  6CIT Screen  What Year? 0 points  What month? 0 points  What time? 0 points    Immunizations Immunization History  Administered Date(s) Administered   Influenza Split 12/05/2010, 10/31/2011   Influenza Whole 01/16/2009, 11/01/2009   Influenza, High Dose Seasonal PF 11/08/2014, 11/17/2017   Influenza,inj,Quad PF,6+ Mos 11/01/2012, 10/31/2013, 10/09/2015   Influenza,inj,Quad PF,6-35 Mos 11/14/2016   Moderna Sars-Covid-2 Vaccination 02/12/2019, 03/05/2019   Pneumococcal Conjugate-13 12/28/2012   Pneumococcal Polysaccharide-23 02/13/2004, 12/05/2016   Td 06/29/2015   Zoster, Live 06/23/2013   Shingrix Completed?: No.    Education has been provided regarding the importance of this vaccine. Patient has been advised to call insurance company to determine out of pocket expense if they have not yet received this vaccine. Advised may also receive vaccine at local pharmacy or Health Dept. Verbalized acceptance and understanding.  Screening Tests Health Maintenance   Topic Date Due   COVID-19 Vaccine (3 - Moderna risk series) 10/10/2021 (Originally 04/02/2019)   Zoster Vaccines- Shingrix (1 of 2) 12/25/2021 (Originally 05/11/1944)   TETANUS/TDAP  06/28/2025   Pneumonia Vaccine 38+ Years old  Completed   DEXA SCAN  Completed   HPV VACCINES  Aged Out   INFLUENZA VACCINE  Discontinued   Health Maintenance There are no preventive care reminders to display for this patient.  Lung Cancer Screening: (Low Dose CT Chest recommended if Age 53-80 years, 30 pack-year currently smoking OR have quit w/in 15years.) does not qualify.   Hepatitis C Screening: does not qualify  Vision Screening: Recommended annual ophthalmology exams for early detection of glaucoma and other disorders of the eye.  Dental Screening: Recommended annual dental exams for proper oral hygiene  Community Resource Referral / Chronic Care Management: CRR required this visit?  No   CCM required this visit?  No      Plan:     I  have personally reviewed and noted the following in the patient's chart:   Medical and social history Use of alcohol, tobacco or illicit drugs  Current medications and supplements including opioid prescriptions. Patient is currently taking opioid prescriptions. Information provided to patient regarding non-opioid alternatives. Patient advised to discuss non-opioid treatment plan with their provider. Followed by [facility]. Dr. Felipe Drone. Functional ability and status Nutritional status Physical activity Advanced directives List of other physicians Hospitalizations, surgeries, and ER visits in previous 12 months Vitals Screenings to include cognitive, depression, and falls Referrals and appointments  In addition, I have reviewed and discussed with patient certain preventive protocols, quality metrics, and best practice recommendations. A written personalized care plan for preventive services as well as general preventive health  recommendations were provided to patient.     Varney Biles, LPN   [id number]

## 2021-10-05 NOTE — Telephone Encounter (Signed)
Prolia VOB initiated via [email or web address] 

## 2021-11-26 ENCOUNTER — Other Ambulatory Visit (HOSPITAL_COMMUNITY): Payer: Self-pay

## 2021-11-26 DIAGNOSIS — M545 Low back pain, unspecified: Secondary | ICD-10-CM | POA: Diagnosis not present

## 2021-11-26 DIAGNOSIS — Z978 Presence of other specified devices: Secondary | ICD-10-CM | POA: Diagnosis not present

## 2021-11-26 DIAGNOSIS — Z79899 Other long term (current) drug therapy: Secondary | ICD-10-CM | POA: Diagnosis not present

## 2021-11-26 DIAGNOSIS — G8929 Other chronic pain: Secondary | ICD-10-CM | POA: Diagnosis not present

## 2021-11-26 DIAGNOSIS — M069 Rheumatoid arthritis, unspecified: Secondary | ICD-10-CM | POA: Diagnosis not present

## 2021-11-26 DIAGNOSIS — R519 Headache, unspecified: Secondary | ICD-10-CM | POA: Diagnosis not present

## 2021-11-26 NOTE — Telephone Encounter (Signed)
Pharmacy Patient Advocate Encounter  Insurance verification completed.    The patient is insured through [employer] test claims for: Prolia '60mg'$ .  Pharmacy benefit copay: $95.00

## 2021-11-26 NOTE — Telephone Encounter (Signed)
Last Prolia injection 05/17/2021

## 2022-01-08 DIAGNOSIS — Z5181 Encounter for therapeutic drug level monitoring: Secondary | ICD-10-CM | POA: Diagnosis not present

## 2022-01-08 DIAGNOSIS — G894 Chronic pain syndrome: Secondary | ICD-10-CM | POA: Diagnosis not present

## 2022-01-08 DIAGNOSIS — F1190 Opioid use, unspecified, uncomplicated: Secondary | ICD-10-CM | POA: Diagnosis not present

## 2022-01-08 DIAGNOSIS — Z978 Presence of other specified devices: Secondary | ICD-10-CM | POA: Diagnosis not present

## 2022-01-08 DIAGNOSIS — M545 Low back pain, unspecified: Secondary | ICD-10-CM | POA: Diagnosis not present

## 2022-01-08 DIAGNOSIS — Z79899 Other long term (current) drug therapy: Secondary | ICD-10-CM | POA: Diagnosis not present

## 2022-01-08 DIAGNOSIS — R519 Headache, unspecified: Secondary | ICD-10-CM | POA: Diagnosis not present

## 2022-04-16 DIAGNOSIS — Z978 Presence of other specified devices: Secondary | ICD-10-CM | POA: Diagnosis not present

## 2022-04-16 DIAGNOSIS — F1190 Opioid use, unspecified, uncomplicated: Secondary | ICD-10-CM | POA: Diagnosis not present

## 2022-04-16 DIAGNOSIS — M545 Low back pain, unspecified: Secondary | ICD-10-CM | POA: Diagnosis not present

## 2022-04-16 DIAGNOSIS — Z79899 Other long term (current) drug therapy: Secondary | ICD-10-CM | POA: Diagnosis not present

## 2022-04-16 DIAGNOSIS — R519 Headache, unspecified: Secondary | ICD-10-CM | POA: Diagnosis not present

## 2022-07-08 DIAGNOSIS — F1190 Opioid use, unspecified, uncomplicated: Secondary | ICD-10-CM | POA: Diagnosis not present

## 2022-07-08 DIAGNOSIS — R519 Headache, unspecified: Secondary | ICD-10-CM | POA: Diagnosis not present

## 2022-07-08 DIAGNOSIS — G8929 Other chronic pain: Secondary | ICD-10-CM | POA: Diagnosis not present

## 2022-07-08 DIAGNOSIS — M545 Low back pain, unspecified: Secondary | ICD-10-CM | POA: Diagnosis not present

## 2022-07-08 DIAGNOSIS — Z978 Presence of other specified devices: Secondary | ICD-10-CM | POA: Diagnosis not present

## 2022-10-08 ENCOUNTER — Ambulatory Visit (INDEPENDENT_AMBULATORY_CARE_PROVIDER_SITE_OTHER): Payer: Medicare HMO | Admitting: *Deleted

## 2022-10-08 DIAGNOSIS — Z0000 Encounter for general adult medical examination without abnormal findings: Secondary | ICD-10-CM | POA: Diagnosis not present

## 2022-10-08 NOTE — Progress Notes (Signed)
Subjective:   [name] [name] is a 89 y.o. female who presents for Medicare Annual (Subsequent) preventive examination.  Visit Complete: Virtual  I connected with  [name] [name] on 10/08/22 by a audio enabled telemedicine application and verified that I am speaking with the correct person using two identifiers.  Patient Location: Home  Provider Location: Office/Clinic  I discussed the limitations of evaluation and management by telemedicine. The patient expressed understanding and agreed to proceed.    Review of Systems     Cardiac Risk Factors include: advanced age (>42men, >69 women);dyslipidemia;hypertension     Objective:   Vital Signs: Unable to obtain new vitals due to this being a telehealth visit.  Today's Vitals   10/08/22 1427  PainSc: 4    There is no height or weight on file to calculate BMI.     09/24/2021    3:09 PM 09/29/2020    3:47 PM 09/27/2020    9:10 PM 05/07/2018    1:03 PM 04/06/2018    7:22 PM 04/06/2018    1:41 PM 02/04/2018    7:50 PM  Advanced Directives  Does Patient Have a Medical Advance Directive? No  No No Yes No;Yes No  Type of Advance Directive     Healthcare Power of Attorney Living will;Healthcare Power of Attorney   Does patient want to make changes to medical advance directive?     No - Patient declined    Copy of Healthcare Power of Attorney in Chart?     No - copy requested    Would patient like information on creating a medical advance directive? No - Patient declined No - Patient declined  No - Patient declined   No - Patient declined    Current Medications (verified) Outpatient Encounter Medications as of 10/08/2022  Medication Sig   hyoscyamine (LEVBID) 0.375 MG 12 hr tablet Take 0.375 mg by mouth 2 (two) times daily as needed. Stomach pain   lactulose (CHRONULAC) 10 GM/15ML solution Take 45 mLs (30 g total) by mouth 2 (two) times daily as needed for moderate constipation.   Multiple Vitamins-Minerals (MULTIVITAMIN WITH MINERALS)  tablet Take 1 tablet by mouth daily.   omeprazole (PRILOSEC) 40 MG capsule Take 1 capsule (40 mg total) by mouth 2 (two) times daily.   oxyCODONE-acetaminophen (PERCOCET) 7.5-325 MG tablet Take 1 tablet by mouth every 8 (eight) hours as needed for severe pain.   OXYGEN Inhale 2 L into the lungs as needed.   promethazine (PHENERGAN) 12.5 MG tablet Take 1 tablet (12.5 mg total) by mouth every 6 (six) hours as needed for nausea or vomiting.   Zoster Vaccine Adjuvanted [facility]) injection Inject 0.5mg  IM now and again in 2-6 months.   Facility-Administered Encounter Medications as of 10/08/2022  Medication   denosumab (PROLIA) injection 60 mg    Allergies (verified) Cefdinir, Codeine, Influenza vaccines, Morphine, Sulfa antibiotics, Zoledronic acid, and Sulfasalazine   History: Past Medical History:  Diagnosis Date   Abdominal pain    Resolved   Cardiac arrhythmia    CHF (congestive heart failure) (HCC)    Diastolic  on ECHO 2013   Choledocholithiasis    Chronic anemia    Chronic back pain    COPD (chronic obstructive pulmonary disease) (HCC)    oxygen dependent at  night 2LPM   Daily headache    GERD (gastroesophageal reflux disease)    HCAP (healthcare-associated pneumonia) 03/28/2014   Hiatal hernia    Hyperlipidemia    Hypertension    On home  oxygen therapy    "1L during the day; 2L q hs" (03/28/2014)   Osteoarthritis    "pretty much q where"    Osteoporosis    Pancolitis (HCC)    Infectious vs. inflammatory   Pancreatitis    History of   Pneumonia 1930's; 2011 X 2; 02/2014   PONV (postoperative nausea and vomiting)    PUD (peptic ulcer disease)    Reactive airway disease that is not asthma    Recurrent UTI (urinary tract infection)    "here lately" (03/28/2014)   Scoliosis deformity of spine    history of intractable back pain   Past Surgical History:  Procedure Laterality Date   ABDOMINAL HYSTERECTOMY  1970s   BACK SURGERY     x 5   BREAST CYST EXCISION Right  1960's   CERVICAL LAMINECTOMY  1970s   CHOLECYSTECTOMY  2008   ERCP W/ SPHICTEROTOMY  02/2010   ESOPHAGOGASTRODUODENOSCOPY N/A 11/14/2013   Procedure: ESOPHAGOGASTRODUODENOSCOPY (EGD);  Surgeon: Barrie Folk, MD;  Location[facility] ENDOSCOPY;  Service: Endoscopy;  Laterality: N/A;   FLEXIBLE SIGMOIDOSCOPY N/A 01/24/2013   Procedure: FLEXIBLE SIGMOIDOSCOPY;  Surgeon: Barrie Folk, MD;  Location: [facility] ENDOSCOPY;  Service: Endoscopy;  Laterality: N/A;   PAIN PUMP IMPLANTATION  ~ 2010   back, dilaudid and bupravacaine   SPINAL CORD STIMULATOR IMPLANT  ~ 2009   SPINE SURGERY  1970's,1995,2007   Family History  Problem Relation Age of Onset   Cancer Mother        uterine   Heart disease Father    Hypertension Other    Osteoporosis Neg Hx    Social History   Socioeconomic History   Marital status: Widowed    Spouse name: Not on file   Number of children: 3   Years of education: Not on file   Highest education level: Not on file  Occupational History    Employer: RETIRED  Tobacco Use   Smoking status: Former    Current packs/day: 0.00    Average packs/day: 1 pack/day for 12.0 years (12.0 ttl pk-yrs)    Types: Cigarettes    Start date: 01/28/1976    Quit date: 01/28/1988    Years since quitting: 34.7   Smokeless tobacco: Never  Substance and Sexual Activity   Alcohol use: No   Drug use: No   Sexual activity: Not Currently  Other Topics Concern   Not on file  Social History Narrative   1 grandchild at care link--Angelia   Lives with great-granddaughter   Social Determinants of Health   Financial Resource Strain: Low Risk  (10/08/2022)   Overall Financial Resource Strain (CARDIA)    Difficulty of Paying Living Expenses: Not hard at all  Food Insecurity: No Food Insecurity (10/08/2022)   Hunger Vital Sign    Worried About Running Out of Food in the Last Year: Never true    Ran Out of Food in the Last Year: Never true  Transportation Needs: No Transportation Needs (09/24/2021)    PRAPARE - Administrator, Civil Service (Medical): No    Lack of Transportation (Non-Medical): No  Physical Activity: Inactive (10/08/2022)   Exercise Vital Sign    Days of Exercise per Week: 0 days    Minutes of Exercise per Session: 0 min  Stress: No Stress Concern Present (10/08/2022)   [employer] of Occupational Health - Occupational Stress Questionnaire    Feeling of Stress : Not at all  Social Connections: Socially Isolated (10/08/2022)  Social Connection and Isolation Panel [NHANES]    Frequency of Communication with Friends and Family: More than three times a week    Frequency of Social Gatherings with Friends and Family: Twice a week    Attends Religious Services: Never    Database administrator or Organizations: No    Attends Banker Meetings: Never    Marital Status: Widowed    Tobacco Counseling Counseling given: Not Answered   Clinical Intake:  Pre-visit preparation completed: Yes  Pain : 0-10 Pain Score: 4  Pain Type: Chronic pain Pain Location: Back Pain Orientation: Lower Pain Onset: More than a month ago Pain Frequency: Constant   Nutritional Risks: None Diabetes: No  How often do you need to have someone help you when you read instructions, pamphlets, or other written materials from your doctor or pharmacy?: 1 - Never  Interpreter Needed?: No  Information entered by :: Donne Anon, CMA   Activities of Daily Living    10/08/2022    2:29 PM  In your present state of health, do you have any difficulty performing the following activities:  Hearing? 1  Vision? 0  Difficulty concentrating or making decisions? 1  Walking or climbing stairs? 1  Dressing or bathing? 0  Doing errands, shopping? 1  Preparing Food and eating ? N  Using the Toilet? N  In the past six months, have you accidently leaked urine? N  Do you have problems with loss of bowel control? N  Managing your Medications? N  Managing your Finances? N   Housekeeping or managing your Housekeeping? N    Patient Care Team: Sandford Craze, NP as PCP - General (Internal Medicine) Oretha Milch, MD as Consulting Physician (Pulmonary Disease) [facility] Vida Rigger, MD as Consulting Physician (Gastroenterology) Marcille Buffy, OD as Consulting Physician (Optometry)  Indicate any recent Medical Services you may have received from other than [facility] providers in the past year (date may be approximate).     Assessment:   This is a routine wellness examination for Junell.  Hearing/Vision screen No results found.   Goals Addressed   None    Depression Screen    10/08/2022    2:35 PM 09/24/2021    3:08 PM 02/12/2021    2:11 PM 07/27/2019    2:26 PM 07/04/2016    2:26 PM 09/19/2015    3:34 PM 12/13/2014    1:48 PM  PHQ 2/9 Scores  PHQ - 2 Score 0 0 0 0 0 0 0    Fall Risk    10/08/2022    2:29 PM 09/24/2021    3:18 PM 02/12/2021    2:11 PM 07/27/2019    2:27 PM 08/18/2016   12:54 PM  Fall Risk   Falls in the past year? 0 1 0 0 No  Comment     Emmi Telephone Survey: data to providers prior to load  Number falls in past yr: 0 0 0 0   Injury with Fall? 0 0 0 0   Risk for fall due to : Impaired balance/gait      Follow up Falls evaluation completed Falls evaluation completed       MEDICARE RISK AT HOME: Medicare Risk at Home Any stairs in or around the home?: Yes If so, are there any without handrails?: No Home free of loose throw rugs in walkways, pet beds, electrical cords, etc?: Yes Adequate lighting in your home to reduce risk of falls?: Yes Life alert?: Yes Use  of a cane, walker or w/c?: Yes Grab bars in the bathroom?: Yes Shower chair or bench in shower?: Yes Elevated toilet seat or a handicapped toilet?: No  TIMED UP AND GO:  Was the test performed?  No    Cognitive Function:    07/04/2016    2:26 PM  MMSE - Mini Mental State Exam  Orientation to time 4  Orientation to time comments Disoriented to  date.  Orientation to Place 5  Registration 3  Attention/ Calculation 1  Attention/Calculation-comments "I can't do that."  Recall 1  Language- name 2 objects 2  Language- repeat 1  Language- follow 3 step command 3  Language- read & follow direction 1  Write a sentence 1  Copy design 1  Total score 23        10/08/2022    2:36 PM 09/24/2021    3:40 PM  6CIT Screen  What Year? 4 points 0 points  What month? 3 points 0 points  What time? 0 points 0 points  Count back from 20 2 points   Months in reverse 2 points   Repeat phrase 10 points   Total Score 21 points     Immunizations Immunization History  Administered Date(s) Administered   Influenza Split 12/05/2010, 10/31/2011   Influenza Whole 01/16/2009, 11/01/2009   Influenza, High Dose Seasonal PF 11/08/2014, 11/17/2017   Influenza,inj,Quad PF,6+ Mos 11/01/2012, 10/31/2013, 10/09/2015   Influenza,inj,Quad PF,6-35 Mos 11/14/2016   Moderna Sars-Covid-2 Vaccination 02/12/2019, 03/05/2019   Pneumococcal Conjugate-13 12/28/2012   Pneumococcal Polysaccharide-23 02/13/2004, 12/05/2016   Td 06/29/2015   Zoster, Live 06/23/2013    TDAP status: Up to date  Flu Vaccine status: Declined, Education has been provided regarding the importance of this vaccine but patient still declined. Advised may receive this vaccine at local pharmacy or Health Dept. Aware to provide a copy of the vaccination record if obtained from local pharmacy or Health Dept. Verbalized acceptance and understanding.  Pneumococcal vaccine status: Up to date  Covid-19 vaccine status: Information provided on how to obtain vaccines.   Qualifies for Shingles Vaccine? Yes   Zostavax completed Yes   Shingrix Completed?: No.    Education has been provided regarding the importance of this vaccine. Patient has been advised to call insurance company to determine out of pocket expense if they have not yet received this vaccine. Advised may also receive vaccine at local  pharmacy or Health Dept. Verbalized acceptance and understanding.  Screening Tests Health Maintenance  Topic Date Due   Zoster Vaccines- Shingrix (1 of 2) 05/11/1944   COVID-19 Vaccine (3 - Moderna risk series) 04/02/2019   Medicare Annual Wellness (AWV)  09/25/2022   DTaP/Tdap/Td (2 - Tdap) 06/28/2025   Pneumonia Vaccine 11+ Years old  Completed   DEXA SCAN  Completed   HPV VACCINES  Aged Out   INFLUENZA VACCINE  Discontinued    Health Maintenance  Health Maintenance Due  Topic Date Due   Zoster Vaccines- Shingrix (1 of 2) 05/11/1944   COVID-19 Vaccine (3 - Moderna risk series) 04/02/2019   Medicare Annual Wellness (AWV)  09/25/2022    Colorectal cancer screening: No longer required.   Mammogram status: No longer required due to age.  Bone Density status: pt declined.  Lung Cancer Screening: (Low Dose CT Chest recommended if Age 41-80 years, 20 pack-year currently smoking OR have quit w/in 15years.) does not qualify.   Additional Screening:  Hepatitis C Screening: does not qualify  Vision Screening: Recommended annual ophthalmology exams for  early detection of glaucoma and other disorders of the eye. Is the patient up to date with their annual eye exam?  No  Who is the provider or what is the name of the office in which the patient attends annual eye exams? Doesn't have eye doctor currently If pt is not established with a provider, would they like to be referred to a provider to establish care? No .   Dental Screening: Recommended annual dental exams for proper oral hygiene  Diabetic Foot Exam: N/a  Community Resource Referral / Chronic Care Management: CRR required this visit?  No   CCM required this visit?  No     Plan:     I have personally reviewed and noted the following in the patient's chart:   Medical and social history Use of alcohol, tobacco or illicit drugs  Current medications and supplements including opioid prescriptions. Patient is currently  taking opioid prescriptions. Information provided to patient regarding non-opioid alternatives. Patient advised to discuss non-opioid treatment plan with their provider. Functional ability and status Nutritional status Physical activity Advanced directives List of other physicians Hospitalizations, surgeries, and ER visits in previous 12 months Vitals Screenings to include cognitive, depression, and falls Referrals and appointments  In addition, I have reviewed and discussed with patient certain preventive protocols, quality metrics, and best practice recommendations. A written personalized care plan for preventive services as well as general preventive health recommendations were provided to patient.     Donne Anon, CMA   10/08/2022   After Visit Summary: (MyChart) Due to this being a telephonic visit, the after visit summary with patients personalized plan was offered to patient via MyChart   Nurse Notes: None

## 2022-10-08 NOTE — Patient Instructions (Signed)
[name] , Thank you for taking time to come for your Medicare Wellness Visit. I appreciate your ongoing commitment to your health goals. Please review the following plan we discussed and let me know if I can assist you in the future.     This is a list of the screening recommended for you and due dates:  Health Maintenance  Topic Date Due   Zoster (Shingles) Vaccine (1 of 2) 05/11/1944   COVID-19 Vaccine (3 - Moderna risk series) 04/02/2019   Medicare Annual Wellness Visit  10/08/2023   DTaP/Tdap/Td vaccine (2 - Tdap) 06/28/2025   Pneumonia Vaccine  Completed   DEXA scan (bone density measurement)  Completed   HPV Vaccine  Aged Out   Flu Shot  Discontinued     Next appointment: Follow up in one year for your annual wellness visit.   Preventive Care 89 Years and Older, Female Preventive care refers to lifestyle choices and visits with your health care provider that can promote health and wellness. What does preventive care include? A yearly physical exam. This is also called an annual well check. Dental exams once or twice a year. Routine eye exams. Ask your health care provider how often you should have your eyes checked. Personal lifestyle choices, including: Daily care of your teeth and gums. Regular physical activity. Eating a healthy diet. Avoiding tobacco and drug use. Limiting alcohol use. Practicing safe sex. Taking low-dose aspirin every day. Taking vitamin and mineral supplements as recommended by your health care provider. What happens during an annual well check? The services and screenings done by your health care provider during your annual well check will depend on your age, overall health, lifestyle risk factors, and family history of disease. Counseling  Your health care provider may ask you questions about your: Alcohol use. Tobacco use. Drug use. Emotional well-being. Home and relationship well-being. Sexual activity. Eating habits. History of  falls. Memory and ability to understand (cognition). Work and work Astronomer. Reproductive health. Screening  You may have the following tests or measurements: Height, weight, and BMI. Blood pressure. Lipid and cholesterol levels. These may be checked every 5 years, or more frequently if you are over 41 years old. Skin check. Lung cancer screening. You may have this screening every year starting at age 73 if you have a 30-pack-year history of smoking and currently smoke or have quit within the past 15 years. Fecal occult blood test (FOBT) of the stool. You may have this test every year starting at age 55. Flexible sigmoidoscopy or colonoscopy. You may have a sigmoidoscopy every 5 years or a colonoscopy every 10 years starting at age 27. Hepatitis C blood test. Hepatitis B blood test. Sexually transmitted disease (STD) testing. Diabetes screening. This is done by checking your blood sugar (glucose) after you have not eaten for a while (fasting). You may have this done every 1-3 years. Bone density scan. This is done to screen for osteoporosis. You may have this done starting at age 57. Mammogram. This may be done every 1-2 years. Talk to your health care provider about how often you should have regular mammograms. Talk with your health care provider about your test results, treatment options, and if necessary, the need for more tests. Vaccines  Your health care provider may recommend certain vaccines, such as: Influenza vaccine. This is recommended every year. Tetanus, diphtheria, and acellular pertussis (Tdap, Td) vaccine. You may need a Td booster every 10 years. Zoster vaccine. You may need this after age  60. Pneumococcal 13-valent conjugate (PCV13) vaccine. One dose is recommended after age 21. Pneumococcal polysaccharide (PPSV23) vaccine. One dose is recommended after age 40. Talk to your health care provider about which screenings and vaccines you need and how often you need  them. This information is not intended to replace advice given to you by your health care provider. Make sure you discuss any questions you have with your health care provider. Document Released: 02/09/2015 Document Revised: 10/03/2015 Document Reviewed: 11/14/2014 Elsevier Interactive Patient Education  2017 [employer].  Fall Prevention in the Home Falls can cause injuries. They can happen to people of all ages. There are many things you can do to make your home safe and to help prevent falls. What can I do on the outside of my home? Regularly fix the edges of walkways and driveways and fix any cracks. Remove anything that might make you trip as you walk through a door, such as a raised step or threshold. Trim any bushes or trees on the path to your home. Use bright outdoor lighting. Clear any walking paths of anything that might make someone trip, such as rocks or tools. Regularly check to see if handrails are loose or broken. Make sure that both sides of any steps have handrails. Any raised decks and porches should have guardrails on the edges. Have any leaves, snow, or ice cleared regularly. Use sand or salt on walking paths during winter. Clean up any spills in your garage right away. This includes oil or grease spills. What can I do in the bathroom? Use night lights. Install grab bars by the toilet and in the tub and shower. Do not use towel bars as grab bars. Use non-skid mats or decals in the tub or shower. If you need to sit down in the shower, use a plastic, non-slip stool. Keep the floor dry. Clean up any water that spills on the floor as soon as it happens. Remove soap buildup in the tub or shower regularly. Attach bath mats securely with double-sided non-slip rug tape. Do not have throw rugs and other things on the floor that can make you trip. What can I do in the bedroom? Use night lights. Make sure that you have a light by your bed that is easy to reach. Do not use  any sheets or blankets that are too big for your bed. They should not hang down onto the floor. Have a firm chair that has side arms. You can use this for support while you get dressed. Do not have throw rugs and other things on the floor that can make you trip. What can I do in the kitchen? Clean up any spills right away. Avoid walking on wet floors. Keep items that you use a lot in easy-to-reach places. If you need to reach something above you, use a strong step stool that has a grab bar. Keep electrical cords out of the way. Do not use floor polish or wax that makes floors slippery. If you must use wax, use non-skid floor wax. Do not have throw rugs and other things on the floor that can make you trip. What can I do with my stairs? Do not leave any items on the stairs. Make sure that there are handrails on both sides of the stairs and use them. Fix handrails that are broken or loose. Make sure that handrails are as long as the stairways. Check any carpeting to make sure that it is firmly attached to the stairs. Fix  any carpet that is loose or worn. Avoid having throw rugs at the top or bottom of the stairs. If you do have throw rugs, attach them to the floor with carpet tape. Make sure that you have a light switch at the top of the stairs and the bottom of the stairs. If you do not have them, ask someone to add them for you. What else can I do to help prevent falls? Wear shoes that: Do not have high heels. Have rubber bottoms. Are comfortable and fit you well. Are closed at the toe. Do not wear sandals. If you use a stepladder: Make sure that it is fully opened. Do not climb a closed stepladder. Make sure that both sides of the stepladder are locked into place. Ask someone to hold it for you, if possible. Clearly mark and make sure that you can see: Any grab bars or handrails. First and last steps. Where the edge of each step is. Use tools that help you move around (mobility aids)  if they are needed. These include: Canes. Walkers. Scooters. Crutches. Turn on the lights when you go into a dark area. Replace any light bulbs as soon as they burn out. Set up your furniture so you have a clear path. Avoid moving your furniture around. If any of your floors are uneven, fix them. If there are any pets around you, be aware of where they are. Review your medicines with your doctor. Some medicines can make you feel dizzy. This can increase your chance of falling. Ask your doctor what other things that you can do to help prevent falls. This information is not intended to replace advice given to you by your health care provider. Make sure you discuss any questions you have with your health care provider. Document Released: 11/09/2008 Document Revised: 06/21/2015 Document Reviewed: 02/17/2014 Elsevier Interactive Patient Education  2017 [employer].

## 2022-10-29 DIAGNOSIS — Z79899 Other long term (current) drug therapy: Secondary | ICD-10-CM | POA: Diagnosis not present

## 2022-10-29 DIAGNOSIS — M545 Low back pain, unspecified: Secondary | ICD-10-CM | POA: Diagnosis not present

## 2022-10-29 DIAGNOSIS — F1190 Opioid use, unspecified, uncomplicated: Secondary | ICD-10-CM | POA: Diagnosis not present

## 2022-10-29 DIAGNOSIS — R519 Headache, unspecified: Secondary | ICD-10-CM | POA: Diagnosis not present

## 2022-10-29 DIAGNOSIS — Z978 Presence of other specified devices: Secondary | ICD-10-CM | POA: Diagnosis not present

## 2022-10-29 DIAGNOSIS — G894 Chronic pain syndrome: Secondary | ICD-10-CM | POA: Diagnosis not present

## 2023-01-06 DIAGNOSIS — M545 Low back pain, unspecified: Secondary | ICD-10-CM | POA: Diagnosis not present

## 2023-01-06 DIAGNOSIS — Z5181 Encounter for therapeutic drug level monitoring: Secondary | ICD-10-CM | POA: Diagnosis not present

## 2023-01-06 DIAGNOSIS — Z79899 Other long term (current) drug therapy: Secondary | ICD-10-CM | POA: Diagnosis not present

## 2023-01-06 DIAGNOSIS — F1190 Opioid use, unspecified, uncomplicated: Secondary | ICD-10-CM | POA: Diagnosis not present

## 2023-01-06 DIAGNOSIS — M419 Scoliosis, unspecified: Secondary | ICD-10-CM | POA: Diagnosis not present

## 2023-01-06 DIAGNOSIS — Z978 Presence of other specified devices: Secondary | ICD-10-CM | POA: Diagnosis not present

## 2023-01-06 DIAGNOSIS — R519 Headache, unspecified: Secondary | ICD-10-CM | POA: Diagnosis not present

## 2023-01-06 DIAGNOSIS — G894 Chronic pain syndrome: Secondary | ICD-10-CM | POA: Diagnosis not present

## 2023-04-07 DIAGNOSIS — M79642 Pain in left hand: Secondary | ICD-10-CM | POA: Diagnosis not present

## 2023-04-07 DIAGNOSIS — M25532 Pain in left wrist: Secondary | ICD-10-CM | POA: Diagnosis not present

## 2023-04-08 ENCOUNTER — Telehealth: Payer: Self-pay | Admitting: *Deleted

## 2023-04-08 NOTE — Telephone Encounter (Signed)
 Pt was on my list for Prolia.  Her last Prolia was 05/17/21 and has not seen you since 2023.  She did her AWV last year.  Would you like for [place] to see if she can come in to discuss?

## 2023-04-13 NOTE — Telephone Encounter (Signed)
 Spoke to daughter Eunice Blase and she reports he mother is stable with progressing dementia. She will like to call back later today to set up appointment.

## 2023-04-15 DIAGNOSIS — G8929 Other chronic pain: Secondary | ICD-10-CM | POA: Diagnosis not present

## 2023-04-15 DIAGNOSIS — Z978 Presence of other specified devices: Secondary | ICD-10-CM | POA: Diagnosis not present

## 2023-04-15 DIAGNOSIS — F1190 Opioid use, unspecified, uncomplicated: Secondary | ICD-10-CM | POA: Diagnosis not present

## 2023-04-15 DIAGNOSIS — M545 Low back pain, unspecified: Secondary | ICD-10-CM | POA: Diagnosis not present

## 2023-04-15 DIAGNOSIS — G894 Chronic pain syndrome: Secondary | ICD-10-CM | POA: Diagnosis not present

## 2023-04-15 DIAGNOSIS — Z79899 Other long term (current) drug therapy: Secondary | ICD-10-CM | POA: Diagnosis not present

## 2023-04-15 DIAGNOSIS — R519 Headache, unspecified: Secondary | ICD-10-CM | POA: Diagnosis not present

## 2023-04-24 DIAGNOSIS — M25532 Pain in left wrist: Secondary | ICD-10-CM | POA: Diagnosis not present

## 2023-04-24 DIAGNOSIS — M1812 Unilateral primary osteoarthritis of first carpometacarpal joint, left hand: Secondary | ICD-10-CM | POA: Diagnosis not present

## 2023-05-21 ENCOUNTER — Ambulatory Visit: Payer: Self-pay

## 2023-05-21 NOTE — Telephone Encounter (Signed)
 Chief Complaint: worsening memory loss Symptoms: worsening memory loss over 1 month Frequency: chronic and worsening Pertinent Negatives: Patient denies one-sided weakness/numbness, fever, CP, SOB (per daughter) Disposition: [] ED /[] Urgent Care (no appt availability in office) / [x] Appointment(In office/virtual)/ []  [place] [facility]/ [] Home Care/ [] Refused Recommended Disposition /[] [place] [facility]/ []  Follow-up with PCP Additional Notes: RN spoke to pt's daughter Stephenie Einstein. Stephenie Einstein states she has noticed pt's memory loss has been worsening over a month. Pt was diagnosed with dementia 1 yr ago. Stephenie Einstein states pt refused help with house-cleaning lately and states she doesn't need the help. [name] states pt was holding a phone book in the kitchen stating she needs to call someone, adding "I can't remember anything." Stephenie Einstein states pt fell recently and states the pt said she did not know how she fell. [name] brought pt to [facility] at that time. Pt eats well one week and does not eat well the next. Pt is eating well this week. Denies one-sided weakness. Does endorse pt has been sleeping often "for a few years." Stephenie Einstein states a month ago she started noticing intermittent slurred speech. Denies urinary symptoms to her knowledge. Denies aggressive or unsafe behavior. Pt takes dilaudid  and bupivacaine administered at a set rate by the pain clinic aas well as oxycodone  for breakthrough pain but those meds aren't new. RN scheduled pt for 4/30 with [name] O'Sullivan. RN advised Stephenie Einstein if pt has one-sided weakness or numbness, vomiting, lethargy, falls, has a fever, urinary symptoms, SOB, CP, or exhibits unsafe behavior she needs to go to the ED.     Reason for Disposition  [1] Worsening confusion AND [2] very gradual onset (weeks to months)  Answer Assessment - Initial Assessment Questions 1. MAIN CONCERN OR SYMPTOM:  "What is your main concern right now?" "What questions do you have?" "What's the  main symptom you're worried about?" (e.g., confusion, memory loss)     Worsening memory loss 2. ONSET:  "When did the symptom start (or worsen)?" (minutes, hours, days, weeks)    "1 month ago pt told family she didn't need help cleaning her house (she does)", yesterday pt was standing in the kitchen with a phone book and said she needed to call someone - then pt put down the book and said she "couldn't remember anything" 3. BETTER-SAME-WORSE: "Are you (the patient) getting better, staying the same, or getting worse compared to the day you (they) were diagnosed or most recent hospital discharge ?"     Getting worse  4. DIAGNOSIS: "Was the dementia diagnosed by a doctor?" If Yes, ask: "When?" (e.g., days, months, years ago)     10 yrs ago 5. MEDICINES: "Has there been any change in medicines recently?" (e.g., narcotics, antihistamines, benzodiazepines, etc.)     "She takes oxycodone  for breakthrough pain. She has a pump that delivers bupivacaine and dilaudid " - "when she goes to the pain clinic they set it" 6. OTHER SYMPTOMS: "Are there any other symptoms?" (e.g., fever, cough, pain, falling)     Been suffering with dementia for 10 years with a recent decline in the past month. Daughter states pt ate good this week. States pt will eat well one week and then barely eat the next week. Daughter states she believes the pt fell "but she won't own up to it." Daughter states she thinks pt fell on the back porch, states she injured her arm. Daughter took her to [facility] for eval. Pt states she didn't know how or why she fell.  Daughter "wonders if there is a UTI." Daughter hasn't noticed any UTI symptoms.  Daughter endorses pt is "extra sleepy" and sleeping a lot "for a few years." Daughter endorses she has noticed some slurred speech intermittently for 1 month. "She sounds like somebody intoxicated" "One time we stopped at [employer] after the pain clinic and she had a burger and fries and I started to get out  and she wasn't moving and I said "can you get out?" And she said "I don't know." (That was 5 years ago) Denies one-sided weakness or numbness.  "A couple yrs ago when she went to the hospital and she came home she was aggressive. In the past she's done that plenty of times, I dont know what happens, she gets aggressive for a few days." Daughter denies aggressive behavior recently  Protocols used: Dementia Symptoms and Questions-A-AH

## 2023-05-27 ENCOUNTER — Ambulatory Visit: Admitting: Family

## 2023-05-27 ENCOUNTER — Telehealth: Payer: Self-pay | Admitting: Family

## 2023-05-27 VITALS — BP 130/57 | HR 84 | Temp 98.8°F | Resp 16 | Ht <= 58 in | Wt 89.0 lb

## 2023-05-27 DIAGNOSIS — R413 Other amnesia: Secondary | ICD-10-CM

## 2023-05-27 DIAGNOSIS — J439 Emphysema, unspecified: Secondary | ICD-10-CM

## 2023-05-27 DIAGNOSIS — S8992XA Unspecified injury of left lower leg, initial encounter: Secondary | ICD-10-CM | POA: Diagnosis not present

## 2023-05-27 DIAGNOSIS — T148XXA Other injury of unspecified body region, initial encounter: Secondary | ICD-10-CM

## 2023-05-27 DIAGNOSIS — R634 Abnormal weight loss: Secondary | ICD-10-CM

## 2023-05-27 DIAGNOSIS — K219 Gastro-esophageal reflux disease without esophagitis: Secondary | ICD-10-CM

## 2023-05-27 DIAGNOSIS — M818 Other osteoporosis without current pathological fracture: Secondary | ICD-10-CM

## 2023-05-27 DIAGNOSIS — G8929 Other chronic pain: Secondary | ICD-10-CM

## 2023-05-27 DIAGNOSIS — M545 Low back pain, unspecified: Secondary | ICD-10-CM

## 2023-05-27 MED ORDER — ALBUTEROL SULFATE HFA 108 (90 BASE) MCG/ACT IN AERS
2.0000 | INHALATION_SPRAY | Freq: Four times a day (QID) | RESPIRATORY_TRACT | Status: DC | PRN
Start: 2023-05-27 — End: 2023-10-23

## 2023-05-27 MED ORDER — DOXYCYCLINE HYCLATE 100 MG PO TABS
100.0000 mg | ORAL_TABLET | Freq: Two times a day (BID) | ORAL | 0 refills | Status: DC
Start: 2023-05-27 — End: 2023-10-23

## 2023-05-27 MED ORDER — OXYCODONE-ACETAMINOPHEN 7.5-325 MG PO TABS: 1.0000 | ORAL_TABLET | Freq: Three times a day (TID) | ORAL | Status: AC | PRN

## 2023-05-27 NOTE — Assessment & Plan Note (Signed)
 Wt Readings from Last 3 Encounters:  05/27/23 89 lb (40.4 kg)  09/24/21 88 lb (39.9 kg)  05/17/21 88 lb 6.4 oz (40.1 kg)   Reports appetite is stable most of the time.

## 2023-05-27 NOTE — Assessment & Plan Note (Signed)
 Has albuterol  on hand for prn use. No recent symptoms noted.

## 2023-05-27 NOTE — Telephone Encounter (Signed)
 Can we please re-initiate Prolia  injections for her?

## 2023-05-27 NOTE — Assessment & Plan Note (Signed)
 Stable on omeprazole once daily.

## 2023-05-27 NOTE — Progress Notes (Signed)
 Subjective:     Patient ID: [name] [name], female    DOB: 1925-05-17, 89 y.o.   MRN: [id number]  Chief Complaint  Patient presents with   Wound Check    Patient has a wound on left leg for about 9 months   Memory Loss    Here to discuss possible treatment for memory loss and confusion     HPI  Discussed the use of AI scribe software for clinical note transcription with the patient, who gave verbal consent to proceed.  History of Present Illness History of Present Illness [name] [name] is a 89 year old female who presents with a chronic non-healing wound and memory loss.  She has had a chronic wound for nine months, frequently traumatized at night, and kept wrapped to prevent further injury. Her great-granddaughter sometimes applies silver nitrate when the wound is dry. Recently, a malodor was noted, suggesting possible infection. The wound appears raw with no abdominal drainage.  She experiences memory loss and confusion, worsening over time, with difficulty in short-term memory and confusion about the day of the week. She lives independently with frequent family check-ins and reads regularly to keep her mind active.  Her appetite fluctuates, but her weight is stable though low. She has not received Prolia  injections for osteoporosis in about two years. She takes omeprazole  daily for stomach issues and occasionally uses Lopid for stomach pain. She has an inhaler but has not needed it recently.  She experiences back pain due to her ribs being on her pelvis and vertebrae rotation, managed with oxycodone  and acetaminophen  7.5 mg and an implanted pain pump. She occasionally uses Phenergan  for nausea. She lives independently with family support nearby. No current pain reported.       Health Maintenance Due  Topic Date Due   Zoster Vaccines- Shingrix  (1 of 2) 05/11/1944   COVID-19 Vaccine (3 - Moderna risk series) 04/02/2019    Past Medical History:  Diagnosis Date   Abdominal  pain    Resolved   Cardiac arrhythmia    CHF (congestive heart failure) (HCC)    Diastolic  on ECHO 2013   Choledocholithiasis    Chronic anemia    Chronic back pain    COPD (chronic obstructive pulmonary disease) (HCC)    oxygen  dependent at  night 2LPM   Daily headache    GERD (gastroesophageal reflux disease)    HCAP (healthcare-associated pneumonia) 03/28/2014   Hiatal hernia    Hyperlipidemia    Hypertension    On home oxygen  therapy    "1L during the day; 2L q hs" (03/28/2014)   Osteoarthritis    "pretty much q where"    Osteoporosis    Pancolitis (HCC)    Infectious vs. inflammatory   Pancreatitis    History of   Pneumonia 1930's; 2011 X 2; 02/2014   PONV (postoperative nausea and vomiting)    PUD (peptic ulcer disease)    Reactive airway disease that is not asthma    Recurrent UTI (urinary tract infection)    "here lately" (03/28/2014)   Scoliosis deformity of spine    history of intractable back pain    Past Surgical History:  Procedure Laterality Date   ABDOMINAL HYSTERECTOMY  1970s   BACK SURGERY     x 5   BREAST CYST EXCISION Right 1960's   CERVICAL LAMINECTOMY  1970s   CHOLECYSTECTOMY  2008   ERCP W/ SPHICTEROTOMY  02/2010   ESOPHAGOGASTRODUODENOSCOPY N/A 11/14/2013   Procedure: ESOPHAGOGASTRODUODENOSCOPY (EGD);  Surgeon: Barbie Boon, MD;  [facility] END[facility];  Service: Endoscopy;  Laterality: N/A;   FLEXIBLE SIGMOIDOSCOPY N/A 01/24/2013   Procedure: FLEXIBLE SIGMOIDOSCOPY;  Surgeon: Barbie Boon, MD;  Location: [facility] ENDOSCOPY;  Service: Endoscopy;  Laterality: N/A;   PAIN PUMP IMPLANTATION  ~ 2010   back, dilaudid  and bupravacaine   SPINAL CORD STIMULATOR IMPLANT  ~ 2009   SPINE SURGERY  1970's,1995,2007    Family History  Problem Relation Age of Onset   Cancer Mother        uterine   Heart disease Father    Hypertension Other    Osteoporosis Neg Hx     Social History   Socioeconomic History   Marital status: Widowed    Spouse name: Not on  file   Number of children: 3   Years of education: Not on file   Highest education level: Not on file  Occupational History    Employer: RETIRED  Tobacco Use   Smoking status: Former    Current packs/day: 0.00    Average packs/day: 1 pack/day for 12.0 years (12.0 ttl pk-yrs)    Types: Cigarettes    Start date: 01/28/1976    Quit date: 01/28/1988    Years since quitting: 35.3   Smokeless tobacco: Never  Substance and Sexual Activity   Alcohol use: No   Drug use: No   Sexual activity: Not Currently  Other Topics Concern   Not on file  Social History Narrative   1 grandchild at care link--Angelia   Lives with great-granddaughter   Social Drivers of Health   Financial Resource Strain: Low Risk  (10/08/2022)   Overall Financial Resource Strain (CARDIA)    Difficulty of Paying Living Expenses: Not hard at all  Food Insecurity: No Food Insecurity (10/08/2022)   Hunger Vital Sign    Worried About Running Out of Food in the Last Year: Never true    Ran Out of Food in the Last Year: Never true  Transportation Needs: No Transportation Needs (09/24/2021)   PRAPARE - Administrator, Civil Service (Medical): No    Lack of Transportation (Non-Medical): No  Physical Activity: Inactive (10/08/2022)   Exercise Vital Sign    Days of Exercise per Week: 0 days    Minutes of Exercise per Session: 0 min  Stress: No Stress Concern Present (10/08/2022)   [employer] of Occupational Health - Occupational Stress Questionnaire    Feeling of Stress : Not at all  Social Connections: Socially Isolated (10/08/2022)   Social Connection and Isolation Panel [NHANES]    Frequency of Communication with Friends and Family: More than three times a week    Frequency of Social Gatherings with Friends and Family: Twice a week    Attends Religious Services: Never    Database administrator or Organizations: No    Attends Banker Meetings: Never    Marital Status: Widowed  Intimate  Partner Violence: Not At Risk (10/08/2022)   Humiliation, Afraid, Rape, and Kick questionnaire    Fear of Current or Ex-Partner: No    Emotionally Abused: No    Physically Abused: No    Sexually Abused: No    Outpatient Medications Prior to Visit  Medication Sig Dispense Refill   hyoscyamine  (LEVBID) 0.375 MG 12 hr tablet Take 0.375 mg by mouth 2 (two) times daily as needed. Stomach pain     lactulose  (CHRONULAC ) 10 GM/15ML solution Take 45 mLs (30 g total) by mouth 2 (two)  times daily as needed for moderate constipation. 236 mL 0   Multiple Vitamins-Minerals (MULTIVITAMIN WITH MINERALS) tablet Take 1 tablet by mouth daily. 30 tablet    omeprazole  (PRILOSEC) 40 MG capsule Take 1 capsule (40 mg total) by mouth 2 (two) times daily. 60 capsule 3   OXYGEN  Inhale 2 L into the lungs as needed.     promethazine  (PHENERGAN ) 12.5 MG tablet Take 1 tablet (12.5 mg total) by mouth every 6 (six) hours as needed for nausea or vomiting. 30 tablet 0   Zoster Vaccine Adjuvanted (SHINGRIX ) injection Inject 0.5mg  IM now and again in 2-6 months. 0.5 mL 1   oxyCODONE -acetaminophen  (PERCOCET) 7.5-325 MG tablet Take 1 tablet by mouth every 8 (eight) hours as needed for severe pain.     Facility-Administered Medications Prior to Visit  Medication Dose Route Frequency Provider Last Rate Last Admin   denosumab  (PROLIA ) injection 60 mg  60 mg Subcutaneous Once O'Sullivan, Jacqueline Delapena, NP        Allergies  Allergen Reactions   Cefdinir  Diarrhea   Codeine Diarrhea and Nausea And Vomiting    REACTION: n/v/d, HA   Influenza Vaccines     Stomach pain, headache   Morphine Diarrhea and Nausea And Vomiting    REACTION: n/v/d, HA   Sulfa  Antibiotics Nausea Only    Sick     Zoledronic Acid Swelling    REACTION: Severe edema   Sulfasalazine Nausea Only    Sick     ROS     Objective:    Physical Exam Constitutional:      General: She is not in acute distress.    Appearance: Normal appearance. She is  well-developed.  HENT:     Head: Normocephalic and atraumatic.     Right Ear: External ear normal.     Left Ear: External ear normal.  Eyes:     General: No scleral icterus. Neck:     Thyroid : No thyromegaly.  Cardiovascular:     Rate and Rhythm: Normal rate and regular rhythm.     Heart sounds: Normal heart sounds. No murmur heard. Pulmonary:     Effort: Pulmonary effort is normal. No respiratory distress.     Breath sounds: No wheezing.     Comments: Breath sounds decreased throughout Musculoskeletal:     Cervical back: Neck supple.  Skin:    General: Skin is warm and dry.     Comments: Skin wound noted left shin  Neurological:     Mental Status: She is alert and oriented to person, place, and time.  Psychiatric:        Mood and Affect: Mood normal.        Behavior: Behavior normal.        Thought Content: Thought content normal.        Judgment: Judgment normal.       BP (!) 130/57 (BP Location: Right Arm, Patient Position: Sitting, Cuff Size: Normal)   Pulse 84   Temp 98.8 F (37.1 C) (Oral)   Resp 16   Ht 4\' 10"  (1.473 m)   Wt 89 lb (40.4 kg)   SpO2 95%   BMI 18.60 kg/m  Wt Readings from Last 3 Encounters:  05/27/23 89 lb (40.4 kg)  09/24/21 88 lb (39.9 kg)  05/17/21 88 lb 6.4 oz (40.1 kg)       Assessment & Plan:   Problem List Items Addressed This Visit       Unprioritized   Weight loss   Wt  Readings from Last 3 Encounters:  05/27/23 89 lb (40.4 kg)  09/24/21 88 lb (39.9 kg)  05/17/21 88 lb 6.4 oz (40.1 kg)   Reports appetite is stable most of the time.      Osteoporosis    Osteoporosis management with Prolia  injections lapsed for two years. Bone density test overdue.  - Reinitiate Prolia  injections.  - Order and schedule bone density test.        Relevant Orders   DG Bone Density   Nonhealing nonsurgical wound limited to breakdown of skin - Primary    Chronic wound for nine months. Possible underlying skin issues. Frequent trauma  may be impeding healing.  - Applied antibiotic ointment and dressed wound with nonadherent gauze and coban wrap. Advised daughter to change daily and apply protective dressing. - Refer to wound care center for evaluation.  - Prescribe antibiotic for potential infection       Relevant Medications   doxycycline  (VIBRA -TABS) 100 MG tablet   Other Relevant Orders   AMB referral to wound care center   Memory loss    Progressive memory loss and confusion. Neurology consultation needed for further assessment and possible treatment. She is skeptical but open to evaluation. - Refer to neurology department in [place] for memory evaluation. - Order blood work: B12, folic acid, thyroid , syphilis screen.       Relevant Orders   Ambulatory referral to Neurology   TSH (Completed)   B12 and Folate Panel (Completed)   RPR   GERD   Stable on omeprazole  once daily.      COPD with emphysema, gold stage C.   Has albuterol  on hand for prn use. No recent symptoms noted.      Relevant Medications   albuterol  (VENTOLIN  HFA) 108 (90 Base) MCG/ACT inhaler   Chronic back pain   Maintained on oxycodone  and pain pump, both managed by the pain clinic.       Relevant Medications   oxyCODONE -acetaminophen  (PERCOCET) 7.5-325 MG tablet    I have changed [name] [name]'s oxyCODONE -acetaminophen . I am also having her start on doxycycline  and albuterol . Additionally, I am having her maintain her multivitamin with minerals, OXYGEN , omeprazole , Shingrix , hyoscyamine , lactulose , and promethazine . We will continue to administer denosumab .  Meds ordered this encounter  Medications   doxycycline  (VIBRA -TABS) 100 MG tablet    Sig: Take 1 tablet (100 mg total) by mouth 2 (two) times daily.    Dispense:  14 tablet    Refill:  0    Supervising Provider:   Randie Bustle A [[id number]]   albuterol  (VENTOLIN  HFA) 108 (90 Base) MCG/ACT inhaler    Sig: Inhale 2 puffs into the lungs every 6 (six) hours as needed for wheezing  or shortness of breath.    Supervising Provider:   Randie Bustle A [[id number]]   oxyCODONE -acetaminophen  (PERCOCET) 7.5-325 MG tablet    Sig: Take 1 tablet by mouth every 8 (eight) hours as needed for severe pain (pain score 7-10).    Supervising Provider:   Randie Bustle A [phone]

## 2023-05-28 DIAGNOSIS — T148XXA Other injury of unspecified body region, initial encounter: Secondary | ICD-10-CM | POA: Insufficient documentation

## 2023-05-28 LAB — B12 AND FOLATE PANEL
Folate: 15.3 ng/mL (ref >5.9)
Vitamin B-12: 246 pg/mL (ref 211–911)

## 2023-05-28 LAB — RPR: RPR Ser Ql: NONREACTIVE

## 2023-05-28 LAB — TSH: TSH: 1.77 u[IU]/mL (ref 0.35–5.50)

## 2023-05-28 NOTE — Assessment & Plan Note (Signed)
  Chronic wound for nine months. Possible underlying skin issues. Frequent trauma may be impeding healing.  - Applied antibiotic ointment and dressed wound with nonadherent gauze and coban wrap. Advised daughter to change daily and apply protective dressing. - Refer to wound care center for evaluation.  - Prescribe antibiotic for potential infection

## 2023-05-28 NOTE — Assessment & Plan Note (Signed)
  Osteoporosis management with Prolia  injections lapsed for two years. Bone density test overdue.  - Reinitiate Prolia  injections.  - Order and schedule bone density test.

## 2023-05-28 NOTE — Patient Instructions (Signed)
 VISIT SUMMARY:  Today, we addressed your chronic wound, memory loss, back pain, osteoporosis, and GERD. We discussed treatment plans and next steps for each of these issues to help manage your symptoms and improve your overall health.  YOUR PLAN:  -CHRONIC NON-HEALING WOUND: A chronic non-healing wound is a wound that has not healed over a long period of time. To help with healing, you should apply antibiotic ointment and dress the wound regularly. We will also refer you to a wound care center for further evaluation and prescribe an antibiotic to address any potential infection.  -MEMORY LOSS AND CONFUSION: Memory loss and confusion can be symptoms of various underlying conditions. We will refer you to the neurology department in [facility] for a thorough memory evaluation. Additionally, we will conduct blood tests to check for vitamin B12, folic acid, thyroid  function, and screen for syphilis.  -CHRONIC BACK PAIN WITH VERTEBRAL ROTATION: Chronic back pain with vertebral rotation is a long-term condition where the vertebrae are misaligned, causing pain. Your current management with oxycodone  and an implanted pain pump will continue. Follow up with the pain clinic in [place] as needed.  -OSTEOPOROSIS: Osteoporosis is a condition where bones become weak and brittle. We will restart your Prolia  injections and schedule a bone density test to assess your bone health.  -GERD: GERD is a condition where stomach acid frequently flows back into the tube connecting your mouth and stomach. Continue taking omeprazole  once daily and use Lopid as needed for stomach pain.  INSTRUCTIONS:  Please follow up with the neurology department in [facility] for your memory evaluation and with the pain clinic in [place] as needed. Additionally, schedule your bone density test and follow up with the wound care center for your chronic wound.

## 2023-05-28 NOTE — Assessment & Plan Note (Signed)
  Progressive memory loss and confusion. Neurology consultation needed for further assessment and possible treatment. She is skeptical but open to evaluation. - Refer to neurology department in [place] for memory evaluation. - Order blood work: B12, folic acid, thyroid , syphilis screen.

## 2023-05-28 NOTE — Assessment & Plan Note (Signed)
 Maintained on oxycodone  and pain pump, both managed by the pain clinic.

## 2023-05-29 ENCOUNTER — Telehealth: Payer: Self-pay | Admitting: Family

## 2023-05-29 DIAGNOSIS — E538 Deficiency of other specified B group vitamins: Secondary | ICD-10-CM | POA: Insufficient documentation

## 2023-05-29 NOTE — Telephone Encounter (Signed)
 Vitamin B12 is low. This could worsen her memory. I would like her to start an over the counter vitamin b12 supplement 1000 mcg once daily.   Other labs are normal.

## 2023-05-29 NOTE — Telephone Encounter (Signed)
Results given to patient's daughter.

## 2023-05-29 NOTE — Telephone Encounter (Signed)
 Left message on machine that we are referring to ortho osteoporosis management clinic.

## 2023-06-03 ENCOUNTER — Encounter: Payer: Self-pay | Admitting: Physician Assistant

## 2023-06-30 DIAGNOSIS — M545 Low back pain, unspecified: Secondary | ICD-10-CM | POA: Diagnosis not present

## 2023-06-30 DIAGNOSIS — Z79899 Other long term (current) drug therapy: Secondary | ICD-10-CM | POA: Diagnosis not present

## 2023-06-30 DIAGNOSIS — F1190 Opioid use, unspecified, uncomplicated: Secondary | ICD-10-CM | POA: Diagnosis not present

## 2023-06-30 DIAGNOSIS — R519 Headache, unspecified: Secondary | ICD-10-CM | POA: Diagnosis not present

## 2023-06-30 DIAGNOSIS — Z5181 Encounter for therapeutic drug level monitoring: Secondary | ICD-10-CM | POA: Diagnosis not present

## 2023-06-30 DIAGNOSIS — Z79891 Long term (current) use of opiate analgesic: Secondary | ICD-10-CM | POA: Diagnosis not present

## 2023-06-30 DIAGNOSIS — Z978 Presence of other specified devices: Secondary | ICD-10-CM | POA: Diagnosis not present

## 2023-06-30 DIAGNOSIS — G894 Chronic pain syndrome: Secondary | ICD-10-CM | POA: Diagnosis not present

## 2023-07-02 ENCOUNTER — Telehealth (HOSPITAL_BASED_OUTPATIENT_CLINIC_OR_DEPARTMENT_OTHER): Payer: Self-pay

## 2023-07-09 ENCOUNTER — Other Ambulatory Visit (HOSPITAL_BASED_OUTPATIENT_CLINIC_OR_DEPARTMENT_OTHER)

## 2023-07-14 ENCOUNTER — Encounter (HOSPITAL_BASED_OUTPATIENT_CLINIC_OR_DEPARTMENT_OTHER): Attending: General Surgery | Admitting: General Surgery

## 2023-07-14 ENCOUNTER — Other Ambulatory Visit (HOSPITAL_BASED_OUTPATIENT_CLINIC_OR_DEPARTMENT_OTHER): Payer: Self-pay | Admitting: General Surgery

## 2023-07-14 DIAGNOSIS — G8929 Other chronic pain: Secondary | ICD-10-CM | POA: Diagnosis not present

## 2023-07-14 DIAGNOSIS — J439 Emphysema, unspecified: Secondary | ICD-10-CM | POA: Insufficient documentation

## 2023-07-14 DIAGNOSIS — L97822 Non-pressure chronic ulcer of other part of left lower leg with fat layer exposed: Secondary | ICD-10-CM | POA: Diagnosis not present

## 2023-07-14 DIAGNOSIS — I5032 Chronic diastolic (congestive) heart failure: Secondary | ICD-10-CM | POA: Diagnosis not present

## 2023-07-14 DIAGNOSIS — C44719 Basal cell carcinoma of skin of left lower limb, including hip: Secondary | ICD-10-CM | POA: Diagnosis not present

## 2023-07-17 LAB — DERMATOLOGY PATHOLOGY

## 2023-07-19 NOTE — Progress Notes (Addendum)
 Assessment/Plan:     [name] [name] is a very pleasant 89 y.o. year old RH female with a history of hypertension, hyperlipidemia, normocytic anemia, polycythemia, COPD, GERD, chronic diastolic heart failure (from Sepsis x4), B12 deficiency, PUD, arthritis, chronic back pain and vitamin D  deficiency, chronic tension headaches, CKD, and a diagnosis of dementia dating back to 2018 seen today for evaluation of memory loss. MoCA unable to be tested, MMSE is 20/30.  In view of her advanced age, we discussed that medications for memory may not be indicated due to potential side effects of the medication. Patient needs assistance with some ADLs.  Patient no longer drives.  Dementia of unclear etiology, concern for Alzheimer's disease and vascular etiology  MRI brain without contrast to assess for underlying structural abnormality and assess vascular load, in view of worsening symptoms over the last couple of months.   Replenish B12 (246) Recommend good control of cardiovascular risk factors.   Continue to control mood as per PCP Continue to check hearing in an effort to improve comprehension Folllow up pending the results of the MRI of the brain  Subjective:    The patient is accompanied by her daughter who supplements her history.  How long did patient have memory difficulties?  For the last 5-6 years . Both STM And LTM Are involved, worse over the last 2 months   Patient has difficulty remembering new information, recent conversations and names of people.  Daughter states that she may confabulate. repeats oneself?  Endorsed Disoriented when walking into a room?  Denies Leaving objects in unusual places?  Not placing back, she has trouble finding them -her daughter says. Wandering behavior? Denies.   Any personality changes, or depression, anxiety?  Has moments of irritability.  Hallucinations or paranoia?  She took her daughter the other day that Someone called her and ask for help and then  took her.  (This sounds more like a confabulation than a hallucination).  Patient is concerned that someone may break into her house.    Seizures? Denies.    Any sleep changes?  Sleeps well, a lot during the day, not as much at night .Denies vivid dreams, REM behavior or sleepwalking.   Sleep apnea? Denies.   Any hygiene concerns?  Denies.   Independent of bathing and dressing?  Needs assistance getting dressed.  Who is in charge of the medications?  Daughter is in charge   Who is in charge of the finances?  Daughter is in charge     Any changes in appetite?  Decreased.  She is to start taking supplements. Patient have trouble swallowing?  Denies.   Does the patient cook?  No   Any headaches?  Denies.   Any vision changes?  She needs glasses. Chronic back pain?  Denies.   Ambulates with difficulty? Needs a walker to ambulate, severe scoliosis .     Recent falls or head injuries? Denies.     Vision changes? Denies. Stroke like symptoms?  Denies.   Any tremors?  Denies.   Any anosmia?  Denies.   Any incontinence of urine? Denies.   Any bowel dysfunction? Denies.      Patient lives alone, next door to her son who monitors her on a frequent basis History of heavy alcohol intake? Denies.   History of heavy tobacco use? Denies.   Family history of dementia?  Mo and 2 brothers with AD   Does patient drive? 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    Allergies  Allergen Reactions   Cefdinir  Diarrhea   Codeine Diarrhea and Nausea And Vomiting    REACTION: n/v/d, HA   Influenza Vaccines     Stomach pain, headache   Morphine Diarrhea and Nausea And Vomiting    REACTION: n/v/d, HA   Sulfa  Antibiotics Nausea Only    Sick      Zoledronic Acid Swelling    REACTION: Severe edema   Sulfasalazine Nausea Only    Sick     Current Outpatient Medications  Medication Instructions   albuterol  (VENTOLIN  HFA) 108 (90 Base) MCG/ACT inhaler 2 puffs, Inhalation, Every 6 hours PRN   doxycycline  (VIBRA -TABS) 100 mg, Oral, 2 times daily   hyoscyamine  (LEVBID ) 0.375 mg, 2 times daily PRN   lactulose  (CHRONULAC ) 30 g, Oral, 2 times daily PRN   Multiple Vitamins-Minerals (MULTIVITAMIN WITH MINERALS) tablet 1 tablet, Oral, Daily   omeprazole  (PRILOSEC) 40 mg, Oral, 2 times daily   oxyCODONE -acetaminophen  (PERCOCET) 7.5-325 MG tablet 1 tablet, Oral, Every 8 hours PRN   OXYGEN  2 L, As needed   promethazine  (PHENERGAN ) 12.5 mg, Oral, Every 6 hours PRN   Zoster Vaccine Adjuvanted (SHINGRIX ) injection Inject 0.5mg  IM now and again in 2-6 months.     VITALS:   Vitals:   07/22/23 1314 07/22/23 1324  BP:  124/69  Pulse:  87  Resp:  18  SpO2:  98%  Weight:  87 lb (39.5 kg)  Height: 4' 10 (1.473 m) 4' 10 (1.473 m)           No data to display             07/22/2023    5:00 PM 07/04/2016    2:26 PM  MMSE - Mini Mental State Exam  Orientation to time 2 4   Orientation to time comments  Disoriented to date.   Orientation to Place 3 5   Registration 3 3   Attention/ Calculation 4 1   Attention/Calculation-comments  I can't do that.   Recall 1 1   Language- name 2 objects 2 2   Language- repeat 1 1  Language- follow 3 step command 2 3   Language- read & follow direction 1 1   Write a sentence 1 1   Copy design 0 1   Total score 20 23      Data saved with a previous flowsheet row definition     PHYSICAL EXAM   HEENT:  Normocephalic, atraumatic. The mucous membranes are moist. The superficial temporal arteries are without ropiness or tenderness. Cardiovascular: Regular rate and rhythm. Lungs: Clear to auscultation bilaterally. Neck: There are no carotid bruits noted bilaterally. Orientation:  Alert and  oriented to person, not to place and time. No aphasia or dysarthria. Fund of knowledge is reduced. Recent and remote memory impaired.  Attention and concentration are reduced.  Able to name objects and unable to repeat phrases. Delayed recall 1/3 Cranial nerves: There is good facial symmetry. Extraocular muscles are intact and visual fields are full to confrontational testing. Speech is fluent and clear, no tongue deviation. Hearing is decreased to conversational tone.  Tone: Tone is good throughout. Sensation: Sensation is intact to light touch and pinprick throughout. Vibration is intact at the bilateral big toe.  Coordination: The patient has no difficulty with RAM's or FNF bilaterally. Normal finger to nose  Motor: Strength is 5/5 in the bilateral upper and lower extremities. There is no pronator drift. There are no fasciculations noted. DTR's: Deep tendon reflexes are 2/4 .  Plantar responses are downgoing bilaterally. Gait and Station: The patient is able to ambulate with difficulty. Needs a walker to ambulate he has severe scoliosis.  Gait is cautious and narrow.      Thank you for allowing us  the opportunity to participate in the care of this nice patient. Please do not hesitate to contact us  for any questions or concerns.   Total time spent on today's visit was 60 minutes dedicated to this patient today, preparing to see patient, examining the patient, ordering tests and/or medications and counseling the patient, documenting clinical information in the EHR or other health record, independently interpreting results and communicating results to the patient/family, discussing treatment and goals, answering patient's questions and coordinating care.  Cc:  Daryl Setter, NP  Camie Sevin 07/22/2023 2:09 PM

## 2023-07-22 ENCOUNTER — Encounter: Payer: Self-pay | Admitting: Physician Assistant

## 2023-07-22 ENCOUNTER — Ambulatory Visit (INDEPENDENT_AMBULATORY_CARE_PROVIDER_SITE_OTHER): Admitting: Physician Assistant

## 2023-07-22 ENCOUNTER — Ambulatory Visit (HOSPITAL_BASED_OUTPATIENT_CLINIC_OR_DEPARTMENT_OTHER): Admitting: General Surgery

## 2023-07-22 ENCOUNTER — Ambulatory Visit

## 2023-07-22 VITALS — BP 124/69 | HR 87 | Resp 18 | Ht <= 58 in | Wt 87.0 lb

## 2023-07-22 DIAGNOSIS — R413 Other amnesia: Secondary | ICD-10-CM | POA: Diagnosis not present

## 2023-07-22 NOTE — Patient Instructions (Signed)
 It was a pleasure to see you today at our office.   Recommendations:   MRI of the brain, the radiology office will call you to arrange you appointment    Replenish B12  Follow up  pending on the results of the MRI  Recommend visiting the website :  Dementia Success Path to better understand some behaviors related to memory loss.  For psychiatric meds, mood meds: Please have your primary care physician manage these medications.  If you have any severe symptoms of a stroke, or other severe issues such as confusion,severe chills or fever, etc call 911 or go to the ER as you may need to be evaluated further    For assessment of decision of mental capacity and competency:  Call Dr. Rosaline Nine, geriatric psychiatrist at [phone] Counseling regarding caregiver distress, including caregiver depression, anxiety and issues regarding community resources, adult day care programs, adult living facilities, or memory care questions:  please contact your  Primary Doctor's Social Worker   FOR Memory  decline, memory medications: Call our office [phone]    [email or web address]   RECOMMENDATIONS FOR ALL PATIENTS WITH MEMORY PROBLEMS: 1. Continue to exercise (Recommend 30 minutes of walking everyday, or 3 hours every week) 2. Increase social interactions - continue going to [place] and enjoy social gatherings with friends and family 3. Eat healthy, avoid fried foods and eat more fruits and vegetables 4. Maintain adequate blood pressure, blood sugar, and blood cholesterol level. Reducing the risk of stroke and cardiovascular disease also helps promoting better memory. 5. Avoid stressful situations. Live a simple life and avoid aggravations. Organize your time and prepare for the next day in anticipation. 6. Sleep well, avoid any interruptions of sleep and avoid any distractions in the bedroom that may interfere with adequate sleep quality 7.  Avoid sugar, avoid sweets as there is a strong link between excessive sugar intake, diabetes, and cognitive impairment We discussed the Mediterranean diet, which has been shown to help patients reduce the risk of progressive memory disorders and reduces cardiovascular risk. This includes eating fish, eat fruits and green leafy vegetables, nuts like almonds and hazelnuts, walnuts, and also use olive oil. Avoid fast foods and fried foods as much as possible. Avoid sweets and sugar as sugar use has been linked to worsening of memory function.  There is always a concern of gradual progression of memory problems. If this is the case, then we may need to adjust level of care according to patient needs. Support, both to the patient and caregiver, should then be put into place.      You have been referred for a neuropsychological evaluation (i.e., evaluation of memory and thinking abilities). Please bring someone with you to this appointment if possible, as it is helpful for the doctor to hear from both you and another adult who knows you well. Please bring eyeglasses and hearing aids if you wear them.    The evaluation will take approximately 3 hours and has two parts:   The first part is a clinical interview with the neuropsychologist (Dr. Richie or Dr. Gayland). During the interview, the neuropsychologist will speak with you and the individual you brought to the appointment.    The second part of the evaluation is testing with the doctor's technician Neal or Luke). During the testing, the technician will ask you to remember different types of material, solve problems, and answer some questionnaires. Your family member will not be present for this portion of the evaluation.  Please note: We must reserve several hours of the neuropsychologist's time and the psychometrician's time for your evaluation appointment. As such, there is a No-Show fee of $100. If you are unable to attend any of your appointments,  please contact our office as soon as possible to reschedule.      DRIVING: Regarding driving, in patients with progressive memory problems, driving will be impaired. We advise to have someone else do the driving if trouble finding directions or if minor accidents are reported. Independent driving assessment is available to determine safety of driving.   If you are interested in the driving assessment, you can contact the following:  [facility] in [place] [phone]  [facility] [phone]  [facility] [phone]  [facility] [phone] or [phone]   FALL PRECAUTIONS: Be cautious when walking. Scan the area for obstacles that may increase the risk of trips and falls. When getting up in the mornings, sit up at the edge of the bed for a few minutes before getting out of bed. Consider elevating the bed at the head end to avoid drop of blood pressure when getting up. Walk always in a well-lit room (use night lights in the walls). Avoid area rugs or power cords from appliances in the middle of the walkways. Use a walker or a cane if necessary and consider physical therapy for balance exercise. Get your eyesight checked regularly.  FINANCIAL OVERSIGHT: Supervision, especially oversight when making financial decisions or transactions is also recommended.  HOME SAFETY: Consider the safety of the kitchen when operating appliances like stoves, microwave oven, and blender. Consider having supervision and share cooking responsibilities until no longer able to participate in those. Accidents with firearms and other hazards in the house should be identified and addressed as well.   ABILITY TO BE LEFT ALONE: If patient is unable to contact 911 operator, consider using LifeLine, or when the need is there, arrange for someone to stay with patients. Smoking is a fire hazard, consider supervision or cessation. Risk of wandering should be assessed  by caregiver and if detected at any point, supervision and safe proof recommendations should be instituted.  MEDICATION SUPERVISION: Inability to self-administer medication needs to be constantly addressed. Implement a mechanism to ensure safe administration of the medications.      Mediterranean Diet A Mediterranean diet refers to food and lifestyle choices that are based on the traditions of countries located on the [place]. This way of eating has been shown to help prevent certain conditions and improve outcomes for people who have chronic diseases, like kidney disease and heart disease. What are tips for following this plan? Lifestyle  Cook and eat meals together with your family, when possible. Drink enough fluid to keep your urine clear or pale yellow. Be physically active every day. This includes: Aerobic exercise like running or swimming. Leisure activities like gardening, walking, or housework. Get 7-8 hours of sleep each night. If recommended by your health care provider, drink red wine in moderation. This means 1 glass a day for nonpregnant women and 2 glasses a day for men. A glass of wine equals 5 oz (150 mL). Reading food labels  Check the serving size of packaged foods. For foods such as rice and pasta, the serving size refers to the amount of cooked product, not dry. Check the total fat in packaged foods. Avoid foods that have saturated fat or trans fats. Check the ingredients list for added sugars, such as corn syrup. Shopping  At the grocery store, buy most of your food from the areas near the walls of the store. This includes: Fresh fruits and vegetables (produce). Grains, beans, nuts, and seeds. Some of these may be available in unpackaged forms or large amounts (in bulk). Fresh seafood. Poultry and eggs. Low-fat dairy products. Buy whole ingredients instead of prepackaged foods. Buy fresh fruits and vegetables in-season from local farmers markets. Buy  frozen fruits and vegetables in resealable bags. If you do not have access to quality fresh seafood, buy precooked frozen shrimp or canned fish, such as tuna, salmon, or sardines. Buy small amounts of raw or cooked vegetables, salads, or olives from the deli or salad bar at your store. Stock your pantry so you always have certain foods on hand, such as olive oil, canned tuna, canned tomatoes, rice, pasta, and beans. Cooking  Cook foods with extra-virgin olive oil instead of using butter or other vegetable oils. Have meat as a side dish, and have vegetables or grains as your main dish. This means having meat in small portions or adding small amounts of meat to foods like pasta or stew. Use beans or vegetables instead of meat in common dishes like chili or lasagna. Experiment with different cooking methods. Try roasting or broiling vegetables instead of steaming or sauteing them. Add frozen vegetables to soups, stews, pasta, or rice. Add nuts or seeds for added healthy fat at each meal. You can add these to yogurt, salads, or vegetable dishes. Marinate fish or vegetables using olive oil, lemon juice, garlic, and fresh herbs. Meal planning  Plan to eat 1 vegetarian meal one day each week. Try to work up to 2 vegetarian meals, if possible. Eat seafood 2 or more times a week. Have healthy snacks readily available, such as: Vegetable sticks with hummus. Greek yogurt. Fruit and nut trail mix. Eat balanced meals throughout the week. This includes: Fruit: 2-3 servings a day Vegetables: 4-5 servings a day Low-fat dairy: 2 servings a day Fish, poultry, or lean meat: 1 serving a day Beans and legumes: 2 or more servings a week Nuts and seeds: 1-2 servings a day Whole grains: 6-8 servings a day Extra-virgin olive oil: 3-4 servings a day Limit red meat and sweets to only a few servings a month What are my food choices? Mediterranean diet Recommended Grains: Whole-grain pasta. Brown rice. Bulgar  wheat. Polenta. Couscous. Whole-wheat bread. [place] Made[place]. Vegetables: Artichokes. Beets. Broccoli. Cabbage. Carrots. Eggplant. Green beans. Chard. Kale. Spinach. Onions. Leeks. Peas. Squash. Tomatoes. Peppers. Radishes. Fruits: Apples. Apricots. Avocado. Berries. Bananas. Cherries. Dates. Figs. Grapes. Lemons. Melon. Oranges. Peaches. Plums. Pomegranate. Meats and other protein foods: Beans. Almonds. Sunflower seeds. Pine nuts. Peanuts. Cod. Salmon. Scallops. Shrimp. Tuna. Tilapia. Clams. Oysters. Eggs. Dairy: Low-fat milk. Cheese. Greek yogurt. Beverages: Water . Red wine. Herbal tea. Fats and oils: Extra virgin olive oil. Avocado oil. Grape seed oil. Sweets and desserts: [place] yogurt with honey. Baked apples. Poached pears. Trail mix. Seasoning and other foods: Basil. Cilantro. Coriander. Cumin. Mint. Parsley. Sage. Rosemary. Tarragon. Garlic. Oregano. Thyme. Pepper. Balsalmic vinegar. Tahini. Hummus. Tomato sauce. Olives. Mushrooms. Limit these Grains: Prepackaged pasta or rice dishes. Prepackaged cereal with added sugar. Vegetables: Deep fried potatoes (french fries). Fruits: Fruit canned in syrup. Meats and other protein foods: Beef. Pork. Lamb. Poultry with skin. Hot dogs. [place]. Dairy: Ice cream. Sour cream. Whole milk. Beverages: Juice. Sugar-sweetened soft drinks. Beer. Liquor and spirits. Fats and oils: Butter. Canola oil. Vegetable oil. Beef fat (tallow). Lard. Sweets and desserts: Cookies.  Cakes. Pies. Candy. Seasoning and other foods: Mayonnaise. Premade sauces and marinades. The items listed may not be a complete list. Talk with your dietitian about what dietary choices are right for you. Summary The Mediterranean diet includes both food and lifestyle choices. Eat a variety of fresh fruits and vegetables, beans, nuts, seeds, and whole grains. Limit the amount of red meat and sweets that you eat. Talk with your health care provider about whether it is safe for you to drink red  wine in moderation. This means 1 glass a day for nonpregnant women and 2 glasses a day for men. A glass of wine equals 5 oz (150 mL). This information is not intended to replace advice given to you by your health care provider. Make sure you discuss any questions you have with your health care provider. Document Released: 09/06/2015 Document Revised: 10/09/2015 Document Reviewed: 09/06/2015 Elsevier Interactive Patient Education  2017 [employer].

## 2023-07-29 ENCOUNTER — Encounter (HOSPITAL_BASED_OUTPATIENT_CLINIC_OR_DEPARTMENT_OTHER): Attending: General Surgery | Admitting: General Surgery

## 2023-07-29 DIAGNOSIS — C44719 Basal cell carcinoma of skin of left lower limb, including hip: Secondary | ICD-10-CM | POA: Diagnosis not present

## 2023-07-29 DIAGNOSIS — L97822 Non-pressure chronic ulcer of other part of left lower leg with fat layer exposed: Secondary | ICD-10-CM | POA: Diagnosis not present

## 2023-08-04 DIAGNOSIS — L97822 Non-pressure chronic ulcer of other part of left lower leg with fat layer exposed: Secondary | ICD-10-CM | POA: Diagnosis not present

## 2023-08-04 DIAGNOSIS — G8929 Other chronic pain: Secondary | ICD-10-CM | POA: Diagnosis not present

## 2023-08-04 DIAGNOSIS — C44719 Basal cell carcinoma of skin of left lower limb, including hip: Secondary | ICD-10-CM | POA: Diagnosis not present

## 2023-08-04 DIAGNOSIS — I5032 Chronic diastolic (congestive) heart failure: Secondary | ICD-10-CM | POA: Diagnosis not present

## 2023-08-04 DIAGNOSIS — J439 Emphysema, unspecified: Secondary | ICD-10-CM | POA: Diagnosis not present

## 2023-08-13 ENCOUNTER — Telehealth: Payer: Self-pay

## 2023-08-13 NOTE — Telephone Encounter (Signed)
 MRI brain WO contrast was ordered multiple attempts from [facility] has not been scheduled. FYI.

## 2023-08-17 ENCOUNTER — Telehealth: Payer: Self-pay

## 2023-08-17 NOTE — Telephone Encounter (Signed)
 MRI brain wo contrast was ordered, mutliple attempts by [facility] imaging to reach patient. FYI.

## 2023-09-01 ENCOUNTER — Telehealth: Payer: Self-pay | Admitting: Radiation Oncology

## 2023-09-01 NOTE — Telephone Encounter (Signed)
 Spoke to pt's daughter, options were provided. Pt's daughter advised she would need to make sure pt was comfortable with appt option before scheduling. Daughter advised she would c/b after talking with pt.

## 2023-09-02 NOTE — Progress Notes (Signed)
 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  SAFETY ISSUES: Prior radiation? None Pacemaker/ICD? None Possible current pregnancy? None Is the patient on methotrexate? None  Current Complaints / other details:  None BP (!) 162/75 (BP Location: Left Arm, Patient Position: Sitting)   Pulse 90   Temp 97.9 F (36.6 C) (Oral)   Resp 16   Ht 4' 11 (1.499 m)   Wt 87 lb 3.2 oz (39.6 kg)   SpO2 100%   BMI 17.61 kg/m    Wt Readings from Last 3 Encounters:  09/09/23 87 lb 3.2 oz (39.6 kg)  07/22/23 87 lb (39.5 kg)  05/27/23 89 lb (40.4 kg)

## 2023-09-08 NOTE — Progress Notes (Signed)
 Radiation Oncology         [phone] ________________________________  Initial outpatient Consultation  Name: [name] [name] MRN: [id number]  Date: 09/09/2023  DOB: April 01, 1925  CC:O'Sullivan, Eleanor, NP  [name] Husband, MD   REFERRING PHYSICIAN: [name] Husband, MD  DIAGNOSIS:    ICD-10-CM   1. Primary basal cell carcinoma of left lower extremity  C44.719      Basal cell carcinoma of left lower leg  At least T2N0M0  (Possible cancers of the right lower extremity, biopsy pending)  CHIEF COMPLAINT: Here to discuss management of skin cancer  HISTORY OF PRESENT ILLNESS:[name] [name] is a 89 y.o. female who presented to her PCP in early June with complains of a wound located of her left lower leg. Patient reported that would has been present for the prior 10-11 months and has not been healing well.   She was then prescribed doxycyline to aid in wound healing and was referred to wound care for further management. Subsequently, she was seen at the wound care team on 07/14/23 during which wound has not improved. Lesion was classified as a non-pressure chronic ulcer with fat layer exposed. She was referred to Dr. [name] on 08/31/2023 and proceeded with a punch biopsy. Pathology showed basal cell carcinoma with nodular patters with lesion extending to her peripheral margins.   Today the patient is present with her supportive daughter, who shares much of the history. She states that this lesion has been present for at least 6 months. Patient's daughter has been changing her dressing for her. She also notes a recent history of two additional skin lesions on her right pre-tibia and posterior right lower extremity. Patient lives independently in her own home.    PREVIOUS RADIATION THERAPY: No  PAST MEDICAL HISTORY:  has a past medical history of Abdominal pain, Cardiac arrhythmia, CHF (congestive heart failure) (HCC), Choledocholithiasis, Chronic anemia, Chronic back pain, COPD (chronic  obstructive pulmonary disease) (HCC), Daily headache, GERD (gastroesophageal reflux disease), HCAP (healthcare-associated pneumonia) (03/28/2014), Hiatal hernia, Hyperlipidemia, Hypertension, On home oxygen  therapy, Osteoarthritis, Osteoporosis, Pancolitis (HCC), Pancreatitis, Pneumonia (1930's; 2011 X 2; 02/2014), PONV (postoperative nausea and vomiting), PUD (peptic ulcer disease), Reactive airway disease that is not asthma, Recurrent UTI (urinary tract infection), and Scoliosis deformity of spine.    PAST SURGICAL HISTORY: Past Surgical History:  Procedure Laterality Date   ABDOMINAL HYSTERECTOMY  1970s   BACK SURGERY     x 5   BREAST CYST EXCISION Right 1960's   CERVICAL LAMINECTOMY  1970s   CHOLECYSTECTOMY  2008   ERCP W/ SPHICTEROTOMY  02/2010   ESOPHAGOGASTRODUODENOSCOPY N/A 11/14/2013   Procedure: ESOPHAGOGASTRODUODENOSCOPY (EGD);  Surgeon: Norleen JAYSON Hint, MD;  Location: [facility] ENDOSCOPY;  Service: Endoscopy;  Laterality: N/A;   FLEXIBLE SIGMOIDOSCOPY N/A 01/24/2013   Procedure: FLEXIBLE SIGMOIDOSCOPY;  Surgeon: Norleen JAYSON Hint, MD;  Location: [facility] ENDOSCOPY;  Service: Endoscopy;  Laterality: N/A;   PAIN PUMP IMPLANTATION  ~ 2010   back, dilaudid  and bupravacaine   SPINAL CORD STIMULATOR IMPLANT  ~ 2009   SPINE SURGERY  1970's,1995,2007    FAMILY HISTORY: family history includes Cancer in her mother; Heart disease in her father; Hypertension in an other family member.  SOCIAL HISTORY:  reports that she quit smoking about 35 years ago. Her smoking use included cigarettes. She started smoking about 47 years ago. She has a 12 pack-year smoking history. She has never used smokeless tobacco. She reports that she does not drink alcohol and does not use drugs.  ALLERGIES: Cefdinir ,  Codeine, Influenza vaccines, Morphine, Sulfa  antibiotics, Zoledronic acid, and Sulfasalazine  MEDICATIONS:  Current Outpatient Medications  Medication Sig Dispense Refill   albuterol  (VENTOLIN  HFA) 108 (90 Base) MCG/ACT  inhaler Inhale 2 puffs into the lungs every 6 (six) hours as needed for wheezing or shortness of breath.     hyoscyamine  (LEVBID ) 0.375 MG 12 hr tablet Take 0.375 mg by mouth 2 (two) times daily as needed. Stomach pain     lactulose  (CHRONULAC ) 10 GM/15ML solution Take 45 mLs (30 g total) by mouth 2 (two) times daily as needed for moderate constipation. 236 mL 0   omeprazole  (PRILOSEC) 40 MG capsule Take 1 capsule (40 mg total) by mouth 2 (two) times daily. 60 capsule 3   oxyCODONE -acetaminophen  (PERCOCET) 7.5-325 MG tablet Take 1 tablet by mouth every 8 (eight) hours as needed for severe pain (pain score 7-10).     promethazine  (PHENERGAN ) 12.5 MG tablet Take 1 tablet (12.5 mg total) by mouth every 6 (six) hours as needed for nausea or vomiting. 30 tablet 0   doxycycline  (VIBRA -TABS) 100 MG tablet Take 1 tablet (100 mg total) by mouth 2 (two) times daily. (Patient not taking: Reported on 09/09/2023) 14 tablet 0   Multiple Vitamins-Minerals (MULTIVITAMIN WITH MINERALS) tablet Take 1 tablet by mouth daily. (Patient not taking: Reported on 09/09/2023) 30 tablet    OXYGEN  Inhale 2 L into the lungs as needed. (Patient not taking: Reported on 09/09/2023)     Zoster Vaccine Adjuvanted (SHINGRIX ) injection Inject 0.5mg  IM now and again in 2-6 months. 0.5 mL 1   Current Facility-Administered Medications  Medication Dose Route Frequency Provider Last Rate Last Admin   denosumab  (PROLIA ) injection 60 mg  60 mg Subcutaneous Once Daryl Setter, NP        REVIEW OF SYSTEMS:  Notable for that above.   PHYSICAL EXAM:  height is 4' 11 (1.499 m) and weight is 87 lb 3.2 oz (39.6 kg). Her oral temperature is 97.9 F (36.6 C). Her blood pressure is 162/75 (abnormal) and her pulse is 90. Her respiration is 16 and oxygen  saturation is 100%.     In general this is a well appearing female in no acute distress. She's alert and oriented x4 and appropriate throughout the examination. Cardiopulmonary assessment is  negative for acute distress and she exhibits normal effort.   She is hard of hearing  Skin lesion approximately 4 cm in greatest dimension on the left pre-tibia with mild bleeding and surrounding erythema. Scaly, brown skin lesion approximately 6 cm on the right pre-tibial region and a darker skin lesion on the posterior right lower extremity approximately 3.5 cm (See pictures below).  Left pre-tibia   Right pre-tibia   Right posterior calf    ECOG = 2  0 - Asymptomatic (Fully active, able to carry on all predisease activities without restriction)  1 - Symptomatic but completely ambulatory (Restricted in physically strenuous activity but ambulatory and able to carry out work of a light or sedentary nature. For example, light housework, office work)  2 - Symptomatic, <50% in bed during the day (Ambulatory and capable of all self care but unable to carry out any work activities. Up and about more than 50% of waking hours)  3 - Symptomatic, >50% in bed, but not bedbound (Capable of only limited self-care, confined to bed or chair 50% or more of waking hours)  4 - Bedbound (Completely disabled. Cannot carry on any self-care. Totally confined to bed or chair)  5 -  Death   Raylene MM, Creech RH, Tormey DC, et al. [phone]). Toxicity and response criteria of the [facility]. Am. DOROTHA Bridges. Oncol. 5 (6): 649-55   LABORATORY DATA:  Lab Results  Component Value Date   WBC 3.2 (L) 11/30/2020   HGB 9.8 (L) 11/30/2020   HCT 31.4 (L) 11/30/2020   MCV 96.9 11/30/2020   PLT 139 (L) 11/30/2020   CMP     Component Value Date/Time   NA 143 05/17/2021 1422   K 5.0 05/17/2021 1422   CL 106 05/17/2021 1422   CO2 27 05/17/2021 1422   GLUCOSE 77 05/17/2021 1422   BUN 33 (H) 05/17/2021 1422   CREATININE 1.07 (H) 05/17/2021 1422   CALCIUM  9.1 05/17/2021 1422   PROT 6.5 05/17/2021 1422   ALBUMIN 3.7 11/30/2020 1508   AST 16 05/17/2021 1422   ALT 6 05/17/2021 1422    ALKPHOS 64 11/30/2020 1508   BILITOT 0.2 05/17/2021 1422   GFR 39.42 (L) 11/06/2020 1457   GFRNONAA 35 (L) 11/30/2020 1508   GFRNONAA 57 (L) 04/27/2017 1446         RADIOGRAPHY: No results found.    IMPRESSION/PLAN: Basal cell carcinoma of left lower leg    This is a lovely 89 yo with biopsy-proven basal cell carcinoma present on the left pre-tibia. She was not deemed a candidate for surgical resection, given the location of her carcinoma and other factor. Dr. Izell offered palliative radiation therapy to this area, and though radiation is not without risks, the lesion is causing pain and is weeping; and Dr. Charisse assessment the risks of the cancer outweigh the potential risks of radiation therapy.  Typically radiation therapy is given 5 days a week for 4 to 6 weeks.  But this is not realistic given the patient's frail status and age.  The patient and her daughter also feel that it would be unrealistic for her to come in 5 days a week for any.  Of time.  Therefore, Dr. Izell proposed that the patient get treated every other day for approximately 1-1/2 weeks to a palliative dose of radiation and then have some time to heal from the effects of treatment.  She can then be reassessed to see how the lesion has clinically changed and be reconsidered for more radiation therapy as appropriate  We discussed the increased risk of a non-healing wound and even exposed bone after giving radiation to the lower extremities, particularly the pretibial area which has poor vascularization. In efforts to minimize this risk, Dr. Izell recommends that the patient receive a lower dose of radiation than typically delivered, followed by a follow-up visit to evaluate treatment response as described above. Based on the response of the skin lesion, she may consider an additional short course of radiation if needed.   She present today with two additional areas of concern on her right lower extremity, detailed above. We  have personally reached out to Dr. Marolyn today for consideration of biopsy to both of these areas. If proven to be cancerous, we could potentially treat these at the same time.  If she is a candidate for additional biopsies, we will wait for these results to be available before treatment is initiated to the right leg.  We will try to coordinate care so that biopsy is performed on the same day as CT simulation as transportation is difficult for the patient and her family.  We discussed the available radiation techniques, and focused on the details of  logistics and delivery.  We reviewed the anticipated acute and late sequelae associated with radiation in this setting. Given the location of her carcinoma, we took special care to acknowledge the increased risk of a non-healing wound. The patient and her daughter were  encouraged to ask questions that I answered to the best of my ability. A patient consent form was discussed and signed.  We retained a copy for our records.  The patient and daughter would like to proceed with radiation and will be scheduled for CT simulation.    On date of service, in total, we spent 60 minutes on this encounter. Patient was seen in person. Note signed after encounter date; minutes pertain to date of service, only.    __________________________________________    Leeroy Due, PA-C   Lauraine Golden, MD    [facility]  Radiation Oncology Direct Dial: [phone]  Fax: [phone] [email or web address]    This document serves as a record of services personally performed by Lauraine Golden, MD. It was created on her behalf by Reymundo Cartwright, a trained medical scribe. The creation of this record is based on the scribe's personal observations and the provider's statements to them. This document has been checked and approved by the attending provider.

## 2023-09-09 ENCOUNTER — Ambulatory Visit
Admission: RE | Admit: 2023-09-09 | Discharge: 2023-09-09 | Disposition: A | Discharge status: Home / Self Care | Source: Ambulatory Visit | Attending: Radiation Oncology | Admitting: Radiation Oncology

## 2023-09-09 ENCOUNTER — Ambulatory Visit: Admitting: Physician Assistant

## 2023-09-09 ENCOUNTER — Ambulatory Visit

## 2023-09-09 VITALS — BP 162/75 | HR 90 | Temp 97.9°F | Resp 16 | Ht 59.0 in | Wt 87.2 lb

## 2023-09-09 DIAGNOSIS — C44719 Basal cell carcinoma of skin of left lower limb, including hip: Secondary | ICD-10-CM | POA: Diagnosis not present

## 2023-09-11 ENCOUNTER — Encounter: Payer: Self-pay | Admitting: Radiation Oncology

## 2023-09-11 ENCOUNTER — Telehealth: Payer: Self-pay | Admitting: Radiation Oncology

## 2023-09-11 NOTE — Telephone Encounter (Signed)
 SPOKE WITH [name] PENDRY AND SCHEDULED THE CT SIM FOR 09-21-23 @ 2 PM PER HER REQUEST. REACHED OUT TO JANIA @ WOUND CARE AND SHE WILL CONTACT [name] TO SCHEDULE. [name] NO LONGER WANTS THE APPT SAME DAY.

## 2023-09-21 ENCOUNTER — Ambulatory Visit
Admission: RE | Admit: 2023-09-21 | Discharge: 2023-09-21 | Disposition: A | Discharge status: Home / Self Care | Source: Ambulatory Visit | Attending: Radiation Oncology | Admitting: Radiation Oncology

## 2023-09-21 DIAGNOSIS — C44719 Basal cell carcinoma of skin of left lower limb, including hip: Secondary | ICD-10-CM | POA: Diagnosis not present

## 2023-09-21 DIAGNOSIS — Z87891 Personal history of nicotine dependence: Secondary | ICD-10-CM | POA: Diagnosis not present

## 2023-09-22 DIAGNOSIS — C44719 Basal cell carcinoma of skin of left lower limb, including hip: Secondary | ICD-10-CM | POA: Diagnosis not present

## 2023-09-22 DIAGNOSIS — Z87891 Personal history of nicotine dependence: Secondary | ICD-10-CM | POA: Diagnosis not present

## 2023-09-30 ENCOUNTER — Ambulatory Visit
Admission: RE | Admit: 2023-09-30 | Discharge: 2023-09-30 | Disposition: A | Discharge status: Home / Self Care | Source: Ambulatory Visit | Attending: Radiation Oncology | Admitting: Radiation Oncology

## 2023-09-30 ENCOUNTER — Other Ambulatory Visit: Payer: Self-pay

## 2023-09-30 DIAGNOSIS — Z87891 Personal history of nicotine dependence: Secondary | ICD-10-CM | POA: Insufficient documentation

## 2023-09-30 DIAGNOSIS — C44719 Basal cell carcinoma of skin of left lower limb, including hip: Secondary | ICD-10-CM | POA: Diagnosis not present

## 2023-09-30 LAB — RAD ONC ARIA SESSION SUMMARY
Course Elapsed Days: 0
Plan Fractions Treated to Date: 1
Plan Prescribed Dose Per Fraction: 3.6 Gy
Plan Total Fractions Prescribed: 5
Plan Total Prescribed Dose: 18 Gy
Reference Point Dosage Given to Date: 3.6 Gy
Reference Point Session Dosage Given: 3.6 Gy
Session Number: 1

## 2023-10-01 ENCOUNTER — Ambulatory Visit

## 2023-10-02 ENCOUNTER — Ambulatory Visit
Admission: RE | Admit: 2023-10-02 | Discharge: 2023-10-02 | Disposition: A | Discharge status: Home / Self Care | Source: Ambulatory Visit | Attending: Radiation Oncology | Admitting: Radiation Oncology

## 2023-10-02 ENCOUNTER — Other Ambulatory Visit: Payer: Self-pay

## 2023-10-02 DIAGNOSIS — C44719 Basal cell carcinoma of skin of left lower limb, including hip: Secondary | ICD-10-CM | POA: Diagnosis not present

## 2023-10-02 LAB — RAD ONC ARIA SESSION SUMMARY
Course Elapsed Days: 2
Plan Fractions Treated to Date: 2
Plan Prescribed Dose Per Fraction: 3.6 Gy
Plan Total Fractions Prescribed: 5
Plan Total Prescribed Dose: 18 Gy
Reference Point Dosage Given to Date: 7.2 Gy
Reference Point Session Dosage Given: 3.6 Gy
Session Number: 2

## 2023-10-05 ENCOUNTER — Ambulatory Visit
Admission: RE | Admit: 2023-10-05 | Discharge: 2023-10-05 | Disposition: A | Discharge status: Home / Self Care | Source: Ambulatory Visit | Attending: Radiation Oncology

## 2023-10-05 ENCOUNTER — Other Ambulatory Visit: Payer: Self-pay

## 2023-10-05 DIAGNOSIS — Z87891 Personal history of nicotine dependence: Secondary | ICD-10-CM | POA: Diagnosis not present

## 2023-10-05 DIAGNOSIS — C44719 Basal cell carcinoma of skin of left lower limb, including hip: Secondary | ICD-10-CM | POA: Diagnosis not present

## 2023-10-05 LAB — RAD ONC ARIA SESSION SUMMARY
Course Elapsed Days: 5
Plan Fractions Treated to Date: 3
Plan Prescribed Dose Per Fraction: 3.6 Gy
Plan Total Fractions Prescribed: 5
Plan Total Prescribed Dose: 18 Gy
Reference Point Dosage Given to Date: 10.8 Gy
Reference Point Session Dosage Given: 3.6 Gy
Session Number: 3

## 2023-10-07 ENCOUNTER — Ambulatory Visit

## 2023-10-07 ENCOUNTER — Other Ambulatory Visit: Payer: Self-pay

## 2023-10-07 ENCOUNTER — Ambulatory Visit
Admission: RE | Admit: 2023-10-07 | Discharge: 2023-10-07 | Disposition: A | Discharge status: Home / Self Care | Source: Ambulatory Visit | Attending: Radiation Oncology | Admitting: Radiation Oncology

## 2023-10-07 ENCOUNTER — Telehealth: Payer: Self-pay | Admitting: Radiation Oncology

## 2023-10-07 DIAGNOSIS — C44719 Basal cell carcinoma of skin of left lower limb, including hip: Secondary | ICD-10-CM | POA: Diagnosis not present

## 2023-10-07 LAB — RAD ONC ARIA SESSION SUMMARY
Course Elapsed Days: 7
Plan Fractions Treated to Date: 4
Plan Prescribed Dose Per Fraction: 3.6 Gy
Plan Total Fractions Prescribed: 5
Plan Total Prescribed Dose: 18 Gy
Reference Point Dosage Given to Date: 14.4 Gy
Reference Point Session Dosage Given: 3.6 Gy
Session Number: 4

## 2023-10-07 NOTE — Telephone Encounter (Signed)
 9/10 @ 11:35 am Left voicemail on both pt's contact number for patient to call our office to be sch for follow up appt.

## 2023-10-09 ENCOUNTER — Ambulatory Visit
Admission: RE | Admit: 2023-10-09 | Discharge: 2023-10-09 | Disposition: A | Discharge status: Home / Self Care | Source: Ambulatory Visit | Attending: Radiation Oncology | Admitting: Radiation Oncology

## 2023-10-09 ENCOUNTER — Other Ambulatory Visit: Payer: Self-pay

## 2023-10-09 DIAGNOSIS — Z510 Encounter for antineoplastic radiation therapy: Secondary | ICD-10-CM | POA: Diagnosis not present

## 2023-10-09 DIAGNOSIS — C44719 Basal cell carcinoma of skin of left lower limb, including hip: Secondary | ICD-10-CM | POA: Diagnosis not present

## 2023-10-09 LAB — RAD ONC ARIA SESSION SUMMARY
Course Elapsed Days: 9
Plan Fractions Treated to Date: 5
Plan Prescribed Dose Per Fraction: 3.6 Gy
Plan Total Fractions Prescribed: 5
Plan Total Prescribed Dose: 18 Gy
Reference Point Dosage Given to Date: 18 Gy
Reference Point Session Dosage Given: 3.6 Gy
Session Number: 5

## 2023-10-13 NOTE — Radiation Completion Notes (Signed)
 Patient Name: [name] [name], [name] [name] MRN: [id number] Date of Birth: 12-15-1925 Referring Physician: SAMULE LUNGER, M.D. Date of Service: 2023-10-13 Radiation Oncologist: Lauraine Golden, M.D. [facility]                             RADIATION ONCOLOGY END OF TREATMENT NOTE     Diagnosis: C44.719 Basal cell carcinoma of skin of left lower limb, including hip Intent: Palliative     ==========DELIVERED PLANS==========  First Treatment Date: 2023-09-30 Last Treatment Date: 2023-10-09   Plan Name: Ext_L_PreTib Site: Tibia, Left Technique: Electron Mode: Electron Dose Per Fraction: 3.6 Gy Prescribed Dose (Delivered / Prescribed): 18 Gy / 18 Gy Prescribed Fxs (Delivered / Prescribed): 5 / 5     ==========ON TREATMENT VISIT DATES========== 2023-10-05     ==========UPCOMING VISITS========== 11/27/2023 [facility] OFFICE VISIT Daryl Setter, NP  11/09/2023 [facility] CT SIMULATION Golden Lauraine, MD  11/09/2023 [facility] FOLLOW UP NEW Golden Lauraine, MD  11/09/2023 [facility] NURSE EVAL [facility] NURSE        ==========APPENDIX - ON TREATMENT VISIT NOTES==========   See weekly On Treatment Notes in Epic for details in the Media tab (listed as Progress notes on the On Treatment Visit Dates listed above).

## 2023-10-14 ENCOUNTER — Emergency Department (HOSPITAL_COMMUNITY)

## 2023-10-14 ENCOUNTER — Other Ambulatory Visit: Payer: Self-pay

## 2023-10-14 ENCOUNTER — Emergency Department (HOSPITAL_COMMUNITY)
Admission: EM | Admit: 2023-10-14 | Discharge: 2023-10-15 | Disposition: A | Discharge status: Home / Self Care | Attending: Emergency Medicine | Admitting: Emergency Medicine

## 2023-10-14 ENCOUNTER — Other Ambulatory Visit

## 2023-10-14 ENCOUNTER — Telehealth: Payer: Self-pay

## 2023-10-14 ENCOUNTER — Encounter (HOSPITAL_COMMUNITY): Payer: Self-pay

## 2023-10-14 DIAGNOSIS — R410 Disorientation, unspecified: Secondary | ICD-10-CM | POA: Diagnosis not present

## 2023-10-14 DIAGNOSIS — Z743 Need for continuous supervision: Secondary | ICD-10-CM | POA: Diagnosis not present

## 2023-10-14 DIAGNOSIS — D649 Anemia, unspecified: Secondary | ICD-10-CM

## 2023-10-14 DIAGNOSIS — I509 Heart failure, unspecified: Secondary | ICD-10-CM | POA: Diagnosis not present

## 2023-10-14 DIAGNOSIS — R4182 Altered mental status, unspecified: Secondary | ICD-10-CM | POA: Diagnosis not present

## 2023-10-14 DIAGNOSIS — I110 Hypertensive heart disease with heart failure: Secondary | ICD-10-CM | POA: Insufficient documentation

## 2023-10-14 DIAGNOSIS — Z9682 Presence of neurostimulator: Secondary | ICD-10-CM | POA: Diagnosis not present

## 2023-10-14 DIAGNOSIS — J323 Chronic sphenoidal sinusitis: Secondary | ICD-10-CM | POA: Insufficient documentation

## 2023-10-14 DIAGNOSIS — R456 Violent behavior: Secondary | ICD-10-CM | POA: Diagnosis not present

## 2023-10-14 DIAGNOSIS — I6789 Other cerebrovascular disease: Secondary | ICD-10-CM | POA: Diagnosis not present

## 2023-10-14 DIAGNOSIS — D696 Thrombocytopenia, unspecified: Secondary | ICD-10-CM

## 2023-10-14 DIAGNOSIS — J449 Chronic obstructive pulmonary disease, unspecified: Secondary | ICD-10-CM | POA: Insufficient documentation

## 2023-10-14 DIAGNOSIS — I700 Atherosclerosis of aorta: Secondary | ICD-10-CM | POA: Diagnosis not present

## 2023-10-14 DIAGNOSIS — R404 Transient alteration of awareness: Secondary | ICD-10-CM | POA: Diagnosis not present

## 2023-10-14 DIAGNOSIS — I771 Stricture of artery: Secondary | ICD-10-CM | POA: Diagnosis not present

## 2023-10-14 DIAGNOSIS — I6782 Cerebral ischemia: Secondary | ICD-10-CM | POA: Diagnosis not present

## 2023-10-14 LAB — CBC WITH DIFFERENTIAL/PLATELET
Abs Immature Granulocytes: 0.03 K/uL (ref 0.00–0.07)
Basophils Absolute: 0 K/uL (ref 0.0–0.1)
Basophils Relative: 1 %
Eosinophils Absolute: 0.1 K/uL (ref 0.0–0.5)
Eosinophils Relative: 2 %
HCT: 32.6 % — ABNORMAL LOW (ref 36.0–46.0)
Hemoglobin: 9.6 g/dL — ABNORMAL LOW (ref 12.0–15.0)
Immature Granulocytes: 1 %
Lymphocytes Relative: 18 %
Lymphs Abs: 0.7 K/uL (ref 0.7–4.0)
MCH: 27.7 pg (ref 26.0–34.0)
MCHC: 29.4 g/dL — ABNORMAL LOW (ref 30.0–36.0)
MCV: 94.2 fL (ref 80.0–100.0)
Monocytes Absolute: 0.4 K/uL (ref 0.1–1.0)
Monocytes Relative: 11 %
Neutro Abs: 2.4 K/uL (ref 1.7–7.7)
Neutrophils Relative %: 67 %
Platelets: 129 K/uL — ABNORMAL LOW (ref 150–400)
RBC: 3.46 MIL/uL — ABNORMAL LOW (ref 3.87–5.11)
RDW: 17.5 % — ABNORMAL HIGH (ref 11.5–15.5)
WBC: 3.6 K/uL — ABNORMAL LOW (ref 4.0–10.5)
nRBC: 0 % (ref 0.0–0.2)

## 2023-10-14 LAB — URINALYSIS, W/ REFLEX TO CULTURE (INFECTION SUSPECTED)
Bilirubin Urine: NEGATIVE
Glucose, UA: NEGATIVE mg/dL
Ketones, ur: 5 mg/dL — AB
Leukocytes,Ua: NEGATIVE
Nitrite: NEGATIVE
Protein, ur: NEGATIVE mg/dL
Specific Gravity, Urine: 1.016 (ref 1.005–1.030)
pH: 5 (ref 5.0–8.0)

## 2023-10-14 LAB — COMPREHENSIVE METABOLIC PANEL WITH GFR
ALT: 5 U/L (ref 0–44)
AST: 21 U/L (ref 15–41)
Albumin: 3.9 g/dL (ref 3.5–5.0)
Alkaline Phosphatase: 111 U/L (ref 38–126)
Anion gap: 13 (ref 5–15)
BUN: 19 mg/dL (ref 8–23)
CO2: 21 mmol/L — ABNORMAL LOW (ref 22–32)
Calcium: 9.1 mg/dL (ref 8.9–10.3)
Chloride: 110 mmol/L (ref 98–111)
Creatinine, Ser: 0.97 mg/dL (ref 0.44–1.00)
GFR, Estimated: 53 mL/min — ABNORMAL LOW (ref >60)
Glucose, Bld: 85 mg/dL (ref 70–99)
Potassium: 4 mmol/L (ref 3.5–5.1)
Sodium: 145 mmol/L (ref 135–145)
Total Bilirubin: <0.2 mg/dL (ref 0.0–1.2)
Total Protein: 6.5 g/dL (ref 6.5–8.1)

## 2023-10-14 LAB — RESP PANEL BY RT-PCR (RSV, FLU A&B, COVID)  RVPGX2
Influenza A by PCR: NEGATIVE
Influenza B by PCR: NEGATIVE
Resp Syncytial Virus by PCR: NEGATIVE
SARS Coronavirus 2 by RT PCR: NEGATIVE

## 2023-10-14 NOTE — ED Triage Notes (Addendum)
 Pt bib EMS from home. Pt was anxious and agitated this morning. Seroquel  25 mg that was prescribed in 2022 that daughter gave this morning that pt doesn't usually take..  Dementia at baseline A&Ox3. Family interested in making her a DNR. Family called pt was not responding, family tried to get urine sample to check for UTI. Pt was responsive for EMS but sleepy. CBG 99 95% RA 104 HR 159/74 BP. EMS reporting pt is more alert than when they picked her up. Pt uses a cane at baseline. Pt also lives alone

## 2023-10-14 NOTE — Telephone Encounter (Signed)
 Patient's daughter notified we will need a urine specimen today. She will come in to get cup left at FO.

## 2023-10-14 NOTE — Telephone Encounter (Signed)
 Copied from CRM [id number]. Topic: Clinical - Medical Advice >> Oct 14, 2023 11:40 AM Maisie BROCKS wrote: Reason for CRM: Debbbie, pt's daughter, called and asked for a nurse to call her because she believes the patient may have a UTI. I offered to schedule her an appt but she declined and stated she wants a nurse to call. Please call at [phone].

## 2023-10-14 NOTE — ED Provider Notes (Signed)
 Care assumed form Dr. Charlyn, patient with altered mental status with unremarkable labs. CT of head is pending, plan is for discharge if no significant findings.  CT of head shows no acute intracranial abnormality, chronic microvascular ischemic disease, chronic left sphenoid sinusitis.  Have independently viewed the images, and agree with the radiologist's interpretation.  Family has requested IV fluids and I have given her liter of normal saline.  She is safe for discharge but I have advised the family to return if she has any difficulties at home.   CT Head Wo Contrast Result Date: 10/15/2023 CLINICAL DATA:  Mental status change, unknown cause. EXAM: CT HEAD WITHOUT CONTRAST TECHNIQUE: Contiguous axial images were obtained from the base of the skull through the vertex without intravenous contrast. RADIATION DOSE REDUCTION: This exam was performed according to the departmental dose-optimization program which includes automated exposure control, adjustment of the mA and/or kV according to patient size and/or use of iterative reconstruction technique. COMPARISON:  CT head November 30, 2020. FINDINGS: Brain: No evidence of acute infarction, hemorrhage, hydrocephalus, extra-axial collection or mass lesion/mass effect. Cerebral atrophy. Patchy white matter within sees are compatible with chronic microvascular disease. Vascular: No hyperdense vessel or unexpected calcification. Skull: No acute fracture. Sinuses/Orbits: Opacified left sphenoid sinus with surrounding osteitis. No acute orbital findings. Other: No mastoid effusions. IMPRESSION: 1. No evidence of acute intracranial abnormality. 2. Chronic microvascular ischemic disease and cerebral atrophy (ICD10-.9). 3. Chronic left sphenoid sinusitis. Electronically Signed   By: Gilmore GORMAN Molt M.D.   On: 10/15/2023 00:56   DG Chest Port 1 View Result Date: 10/14/2023 CLINICAL DATA:  Altered mental status, assess for pneumonia. EXAM: PORTABLE CHEST 1 VIEW  COMPARISON:  11/06/2020 FINDINGS: The cardiomediastinal contours are stable. Aortic atherosclerosis and tortuosity, unchanged. Pulmonary vasculature is normal. No consolidation, pleural effusion, or pneumothorax. Scoliosis. Spinal stimulator in place per IMPRESSION: No acute chest findings.  Particularly, no evidence of pneumonia. Electronically Signed   By: Andrea Gasman M.D.   On: 10/14/2023 20:19      Raford Lenis, MD 10/15/23 [phone]

## 2023-10-14 NOTE — ED Provider Notes (Signed)
 [place] EMERGENCY DEPARTMENT AT [facility] Provider Note   CSN: [id number] Arrival date & time: 10/14/23  1711     Patient presents with: Altered Mental Status   [name] [name] is a 89 y.o. female.   HPI    Patient comes in with chief complaint of altered mental status. Patient brought in by great granddaughter and granddaughter.  According to them, patient has history of hypertension, cardiac arrhythmia, CHF, COPD.  Patient lives at home, she is fairly independent.  Family is nearby and they are in and out.  Over the last 2 to 3 days, patient has had more confusion.  Today patient was noted to be dressed up ready to go somewhere when family came to check on her.  Patient was agitated thereafter.  Family had as needed Seroquel  from several years ago, they decided to give her 1 tab of it.  Thereafter patient fell asleep and has been sluggish and confused, therefore they decided to bring her to the ER.  Confusion is described as patient talking about things that either are not making sense or are not occurring.  No recent illnesses, falls, nausea, vomiting.  Prior to Admission medications   Medication Sig Start Date End Date Taking? Authorizing Provider  albuterol  (VENTOLIN  HFA) 108 (90 Base) MCG/ACT inhaler Inhale 2 puffs into the lungs every 6 (six) hours as needed for wheezing or shortness of breath. 05/27/23   O'Sullivan, [name], NP  doxycycline  (VIBRA -TABS) 100 MG tablet Take 1 tablet (100 mg total) by mouth 2 (two) times daily. Patient not taking: Reported on 09/09/2023 05/27/23   O'Sullivan, [name], NP  hyoscyamine  (LEVBID ) 0.375 MG 12 hr tablet Take 0.375 mg by mouth 2 (two) times daily as needed. Stomach pain 02/08/20   [provider]  lactulose  (CHRONULAC ) 10 GM/15ML solution Take 45 mLs (30 g total) by mouth 2 (two) times daily as needed for moderate constipation. 10/02/20   Ghimire, Donalda HERO, MD  Multiple Vitamins-Minerals (MULTIVITAMIN WITH MINERALS)  tablet Take 1 tablet by mouth daily. Patient not taking: Reported on 09/09/2023 07/01/15   Daryl Setter, NP  omeprazole  (PRILOSEC) 40 MG capsule Take 1 capsule (40 mg total) by mouth 2 (two) times daily. 11/25/19   O'Sullivan, [name], NP  oxyCODONE -acetaminophen  (PERCOCET) 7.5-325 MG tablet Take 1 tablet by mouth every 8 (eight) hours as needed for severe pain (pain score 7-10). 05/27/23   O'Sullivan, [name], NP  OXYGEN  Inhale 2 L into the lungs as needed. Patient not taking: Reported on 09/09/2023    [provider]  promethazine  (PHENERGAN ) 12.5 MG tablet Take 1 tablet (12.5 mg total) by mouth every 6 (six) hours as needed for nausea or vomiting. 08/23/21   Daryl Setter, NP  Zoster Vaccine Adjuvanted (SHINGRIX ) injection Inject 0.5mg  IM now and again in 2-6 months. 11/25/19   O'Sullivan, [name], NP    Allergies: Cefdinir , Codeine, Influenza vaccines, Morphine, Sulfa  antibiotics, Zoledronic acid, and Sulfasalazine    Review of Systems  All other systems reviewed and are negative.   Updated Vital Signs BP (!) 160/76   Pulse 99   Temp 97.8 F (36.6 C)   Resp 16   Ht 4' 11 (1.499 m)   Wt 39.6 kg   SpO2 97%   BMI 17.63 kg/m   Physical Exam Vitals and nursing note reviewed.  Constitutional:      Appearance: She is well-developed.  HENT:     Head: Atraumatic.  Cardiovascular:     Rate and Rhythm: Normal rate.  Pulmonary:     Effort: Pulmonary effort is normal.  Musculoskeletal:     Cervical back: Normal range of motion and neck supple.  Skin:    General: Skin is warm and dry.  Neurological:     Mental Status: She is alert. She is disoriented.     Cranial Nerves: No cranial nerve deficit.     Sensory: No sensory deficit.     Motor: No weakness.     Coordination: Coordination normal.     Comments: Oriented to self. Able to tell me her birthdate and age Follows simple motor commands     (all labs ordered are listed, but only abnormal results are  displayed) Labs Reviewed  COMPREHENSIVE METABOLIC PANEL WITH GFR - Abnormal; Notable for the following components:      Result Value   CO2 21 (*)    GFR, Estimated 53 (*)    All other components within normal limits  CBC WITH DIFFERENTIAL/PLATELET - Abnormal; Notable for the following components:   WBC 3.6 (*)    RBC 3.46 (*)    Hemoglobin 9.6 (*)    HCT 32.6 (*)    MCHC 29.4 (*)    RDW 17.5 (*)    Platelets 129 (*)    All other components within normal limits  URINALYSIS, W/ REFLEX TO CULTURE (INFECTION SUSPECTED) - Abnormal; Notable for the following components:   Hgb urine dipstick SMALL (*)    Ketones, ur 5 (*)    Bacteria, UA RARE (*)    All other components within normal limits  RESP PANEL BY RT-PCR (RSV, FLU A&B, COVID)  [id number]    EKG: None  Radiology: [facility] Chest Port 1 View Result Date: 10/14/2023 CLINICAL DATA:  Altered mental status, assess for pneumonia. EXAM: PORTABLE CHEST 1 VIEW COMPARISON:  11/06/2020 FINDINGS: The cardiomediastinal contours are stable. Aortic atherosclerosis and tortuosity, unchanged. Pulmonary vasculature is normal. No consolidation, pleural effusion, or pneumothorax. Scoliosis. Spinal stimulator in place per IMPRESSION: No acute chest findings.  Particularly, no evidence of pneumonia. Electronically Signed   By: Andrea Gasman M.D.   On: 10/14/2023 20:19     Procedures   Medications Ordered in the ED - No data to display                                  Medical Decision Making Amount and/or Complexity of Data Reviewed Labs: ordered. Radiology: ordered.   89 year old patient with pertinent past medical history of COPD, hypertension comes in with chief complaint of acute altered mental status change.  Collateral history provided by patient's granddaughter and great granddaughter.  I have reviewed patient's previous records including the medications.  Differential diagnosis considered for this patient includes: ICH / Stroke, acute  coronary syndrome, Infection - UTI/Pneumonia/soft tissue infection leading to encephalopathy, encephalopathy due to electrolyte abnormality or drug interactions or toxins or metabolic conditions like adrenal insufficiency, hyperglycemia, paraneoplastic process   Based on initial assessment, higher suspicion for delirium.  Plan is to get basic labs and CT scan of the brain.  Reassessment: I have independently reviewed the following labs: No evidence of UTI, basic labs are reassuring.   Patient was also reassessed at 11 PM.  She appears to be delirious.  There is some inattention.  Patient is difficult to direct.  She is unable to carry on a conversation like she was earlier.  There is some aphasia like findings, as patient  will respond to a query with something that is completely unrelated.  Based on the overall workup, suspicion is high for delirium, however stroke could be a possibility.  There is no MRI available right now.  Patient's last known normal is well outside of any TNK window.  I think patient overall will have more harm by staying in the ER with this delirium.  Family is comfortable taking her home and staying with her tonight.  If patient's symptoms progress, then they will bring her back to the ER.  Given that this is all new for the patient overall, if she returns we should consider MRI of her brain. Disposition: Discharge if CT scan is negative.   Final diagnoses:  Delirium    ED Discharge Orders     None          Charlyn Sora, MD 10/15/23 0005

## 2023-10-14 NOTE — ED Notes (Signed)
 Pt requesting pain medications.  MD notified, awaiting orders.

## 2023-10-14 NOTE — Telephone Encounter (Signed)
 Per patient's daughter EMS is at the house and will try to take patient to emergency room for care.

## 2023-10-14 NOTE — Telephone Encounter (Unsigned)
 Copied from CRM [id number]. Topic: General - Other >> Oct 14, 2023  4:09 PM Frederich PARAS wrote: Reason for CRM: pt daughter calling to adv that she will not be bringing the urine sample back for her mom, mom was being very combative, the daughter called the police.

## 2023-10-15 ENCOUNTER — Encounter: Payer: Self-pay | Admitting: Family

## 2023-10-15 ENCOUNTER — Encounter

## 2023-10-15 MED ORDER — ACETAMINOPHEN 325 MG PO TABS
650.0000 mg | ORAL_TABLET | Freq: Once | ORAL | Status: AC
  Administered 2023-10-15: 650 mg via ORAL
  Filled 2023-10-15: qty 2

## 2023-10-15 MED ORDER — SODIUM CHLORIDE 0.9 % IV BOLUS
1000.0000 mL | Freq: Once | INTRAVENOUS | Status: AC
  Administered 2023-10-15: 1000 mL via INTRAVENOUS

## 2023-10-15 MED ORDER — AMOXICILLIN-POT CLAVULANATE 875-125 MG PO TABS: 1.0000 | ORAL_TABLET | Freq: Two times a day (BID) | ORAL | 0 refills | Status: DC

## 2023-10-15 NOTE — Discharge Instructions (Signed)
 We suspect that Ms. Selbe is having delirium.  Please focus on providing a calm, familiar environment with consistent routine.  Make sure that she is hydrating well.  Maintain a regular sleep-wake cycle and appropriate daylight exposure.   Avoid any new medications.  If Ms. Lafever symptoms are progressing, then please bring her back to the emergency room as soon as possible.  Symptoms to watch out for include seizure-like activity, one-sided weakness, numbness, slurring of the speech, inability for patient to follow commands.

## 2023-10-20 ENCOUNTER — Ambulatory Visit: Payer: Self-pay

## 2023-10-20 NOTE — Telephone Encounter (Addendum)
 FYI Only or Action Required?: Action required by provider: request for appointment.  Patient was last seen in primary care on 05/27/2023 by Daryl Setter, NP.  Called Nurse Triage reporting Headache.  Symptoms began several days ago.  Interventions attempted: Nothing.  Symptoms are: unchanged.  Triage Disposition: See Physician Within 24 Hours  Patient/caregiver understands and will follow disposition?: Yes  Unable to give address of alternative clinic, due to pt's daughter ending call.   Copied from CRM [id number]. Topic: Clinical - Red Word Triage >> Oct 20, 2023  9:35 AM Mia F wrote: Red Word that prompted transfer to Nurse Triage: Pt daughter says pt has a headache every day. She says the headache seems to be worsening. She was diagnosed with a sinus infection. Pt has been sleeping a lot lately. She think that may be due to the radiation she has been taking. Pt is currently on an antibiotic. Pt daughter says pt is on Augmentin  and she is supposed to be taking it twice a day but because patient is sleeping all day and not eating much, she is only taking the medication once a day. Reason for Disposition  [1] MODERATE weakness (e.g., interferes with work, school, normal activities) AND [2] persists > 3 days  Answer Assessment - Initial Assessment Questions Pt's daughter reports cannot take patient today to PCP or UC and request for later appt tomorrow. Scheduled 10/21/23 at 3:40 PM.  Unable to finish advising UC/ED if symptoms worsen, pt's daughter said she has to go and ended call.   Pt's daughter reports patient is not taking antibiotic as prescribed: Augmentin  twice daily, only once due to patient decreased appeitite and fatigue.  1. DESCRIPTION: Describe how you are feeling.     She is just wiped out from being in ED; she has HA everyday but for past week she said HA getting worse and it's making her sick. 2. SEVERITY: How bad is it?  Can you stand and walk?      yes 3. ONSET: When did these symptoms begin? (e.g., hours, days, weeks, months)     Since 10/14/23 ED 4. CAUSE: What do you think is causing the weakness or fatigue? (e.g., not drinking enough fluids, medical problem, trouble sleeping)     nauseous 5. NEW MEDICINES:  Have you started on any new medicines recently? (e.g., opioid pain medicines, benzodiazepines, muscle relaxants, antidepressants, antihistamines, neuroleptics, beta blockers)     Augmentin  6. OTHER SYMPTOMS: Do you have any other symptoms? (e.g., chest pain, fever, cough, SOB, vomiting, diarrhea, bleeding, other areas of pain)     Denies vomit, fever, chills,  Augmentin  only being given 1x daily, pt not eating or drinking enough.  Protocols used: Weakness (Generalized) and Fatigue-A-AH

## 2023-10-20 NOTE — Telephone Encounter (Signed)
 Appt scheduled

## 2023-10-20 NOTE — Telephone Encounter (Signed)
 Called [facility] and spoke with [facility]. Unable to work pt in, okay with appt tomorrow

## 2023-10-21 ENCOUNTER — Telehealth: Payer: Self-pay | Admitting: Family

## 2023-10-21 ENCOUNTER — Ambulatory Visit: Admitting: Internal Medicine

## 2023-10-21 DIAGNOSIS — G894 Chronic pain syndrome: Secondary | ICD-10-CM | POA: Diagnosis not present

## 2023-10-21 DIAGNOSIS — F1190 Opioid use, unspecified, uncomplicated: Secondary | ICD-10-CM | POA: Diagnosis not present

## 2023-10-21 DIAGNOSIS — M545 Low back pain, unspecified: Secondary | ICD-10-CM | POA: Diagnosis not present

## 2023-10-21 MED ORDER — AMOXICILLIN-POT CLAVULANATE 400-57 MG/5ML PO SUSR: 875.0000 mg | Freq: Two times a day (BID) | ORAL | 0 refills | Status: DC

## 2023-10-21 NOTE — Telephone Encounter (Signed)
 Copied from CRM #[id number]. Topic: Clinical - Medication Question >> Oct 21, 2023 12:27 PM Berneda FALCON wrote: Reason for CRM: Daughter, Marval, states that the patient is on Augmentin  and she is having a hard time taking the pills. She choked on the pills one day and so 2 of the days, she only 1 pill when she should have had 2 per day. Daughter states she would like to know if we could do this as an injection or something a little easier for her to take. She states she is cutting it into 4ths but they are still very big and needs something different if at all possible please. She would also like someone to review the urine sample results with her as well, please.  Please call [name] back at [phone]

## 2023-10-21 NOTE — Telephone Encounter (Signed)
 Patient's daughter advised of new prescription

## 2023-10-21 NOTE — Telephone Encounter (Signed)
 I sent an rx for augmentin  liquid to her pharmacy.

## 2023-10-23 ENCOUNTER — Encounter: Payer: Self-pay | Admitting: Family Medicine

## 2023-10-23 ENCOUNTER — Ambulatory Visit: Payer: Self-pay | Admitting: Family Medicine

## 2023-10-23 ENCOUNTER — Ambulatory Visit (INDEPENDENT_AMBULATORY_CARE_PROVIDER_SITE_OTHER): Admitting: Family Medicine

## 2023-10-23 VITALS — BP 120/80 | HR 79 | Temp 98.2°F | Resp 16 | Ht 59.0 in | Wt 86.6 lb

## 2023-10-23 DIAGNOSIS — D649 Anemia, unspecified: Secondary | ICD-10-CM

## 2023-10-23 DIAGNOSIS — K219 Gastro-esophageal reflux disease without esophagitis: Secondary | ICD-10-CM | POA: Diagnosis not present

## 2023-10-23 DIAGNOSIS — E538 Deficiency of other specified B group vitamins: Secondary | ICD-10-CM | POA: Diagnosis not present

## 2023-10-23 DIAGNOSIS — E559 Vitamin D deficiency, unspecified: Secondary | ICD-10-CM | POA: Diagnosis not present

## 2023-10-23 DIAGNOSIS — Z87898 Personal history of other specified conditions: Secondary | ICD-10-CM

## 2023-10-23 LAB — POC URINALSYSI DIPSTICK (AUTOMATED)
Blood, UA: NEGATIVE
Glucose, UA: NEGATIVE
Ketones, UA: NEGATIVE
Leukocytes, UA: NEGATIVE
Nitrite, UA: NEGATIVE
Protein, UA: NEGATIVE
Spec Grav, UA: 1.02 (ref 1.010–1.025)
Urobilinogen, UA: 0.2 U/dL
pH, UA: 5 (ref 5.0–8.0)

## 2023-10-23 NOTE — Assessment & Plan Note (Signed)
 Check labs  Pt has not been to hematology

## 2023-10-23 NOTE — Progress Notes (Signed)
 Subjective:    Patient ID: [name] [name], female    DOB: 1925/11/30, 89 y.o.   MRN: [id number]  Chief Complaint  Patient presents with   Hospitalization Follow-up    HPI Patient is in today for hosp f/u.  Discussed the use of AI scribe software for clinical note transcription with the patient, who gave verbal consent to proceed.  History of Present Illness [name] [name] is a 89 year old female who presents for follow-up after a recent emergency department visit for confusion and a sinus infection.  She was taken to the emergency department due to confusion and was diagnosed with an infection in her left sinus. A urine dip showed bacteria and a little blood, although a urine culture was ordered but not completed. Tests for COVID-19, flu, and RSV were negative.  Her hemoglobin level was 9.6, and there is no known history of anemia. Her hemoglobin levels have been low in the past, with records showing levels of 8.4 and 9.0 over the last three years. She has not been evaluated by a gastroenterologist for this.  She has experienced a decrease in appetite and weight loss, losing one pound since August 13th, now weighing 86 pounds. Her appetite is improving, but she was unable to eat for about a week while she was sick. She is currently able to eat but is 'just not hungry'.  She is taking antibiotics in liquid form after having difficulty swallowing tablets. This is her second day on the liquid antibiotics. No blood in the toilet or burning during urination. Occasional stomach pain but no current pain or heartburn. She does not take B12 supplements despite a history of low levels.  She has not received a flu shot this year and does not wish to receive one. She also reports difficulty sleeping.    Past Medical History:  Diagnosis Date   Abdominal pain    Resolved   Cardiac arrhythmia    CHF (congestive heart failure) (HCC)    Diastolic  on ECHO 2013   Choledocholithiasis    Chronic  anemia    Chronic back pain    COPD (chronic obstructive pulmonary disease) (HCC)    oxygen  dependent at  night 2LPM   Daily headache    GERD (gastroesophageal reflux disease)    HCAP (healthcare-associated pneumonia) 03/28/2014   Hiatal hernia    Hyperlipidemia    Hypertension    On home oxygen  therapy    1L during the day; 2L q hs (03/28/2014)   Osteoarthritis    pretty much q where    Osteoporosis    Pancolitis    Infectious vs. inflammatory   Pancreatitis    History of   Pneumonia 1930's; 2011 X 2; 02/2014   PONV (postoperative nausea and vomiting)    PUD (peptic ulcer disease)    Reactive airway disease that is not asthma    Recurrent UTI (urinary tract infection)    here lately (03/28/2014)   Scoliosis deformity of spine    history of intractable back pain    Past Surgical History:  Procedure Laterality Date   ABDOMINAL HYSTERECTOMY  1970s   BACK SURGERY     x 5   BREAST CYST EXCISION Right 1960's   CERVICAL LAMINECTOMY  1970s   CHOLECYSTECTOMY  2008   ERCP W/ SPHICTEROTOMY  02/2010   ESOPHAGOGASTRODUODENOSCOPY N/A 11/14/2013   Procedure: ESOPHAGOGASTRODUODENOSCOPY (EGD);  Surgeon: Norleen JAYSON Hint, MD;  Location: [facility] ENDOSCOPY;  Service: Endoscopy;  Laterality: N/A;   FLEXIBLE  SIGMOIDOSCOPY N/A 01/24/2013   Procedure: FLEXIBLE SIGMOIDOSCOPY;  Surgeon: Norleen JAYSON Hint, MD;  Location: [facility] ENDOSCOPY;  Service: Endoscopy;  Laterality: N/A;   PAIN PUMP IMPLANTATION  ~ 2010   back, dilaudid  and bupravacaine   SPINAL CORD STIMULATOR IMPLANT  ~ 2009   SPINE SURGERY  1970's,1995,2007    Family History  Problem Relation Age of Onset   Cancer Mother        uterine   Heart disease Father    Hypertension Other    Osteoporosis Neg Hx     Social History   Socioeconomic History   Marital status: Widowed    Spouse name: Not on file   Number of children: 3   Years of education: Not on file   Highest education level: Not on file  Occupational History    Employer: RETIRED   Tobacco Use   Smoking status: Former    Current packs/day: 0.00    Average packs/day: 1 pack/day for 12.0 years (12.0 ttl pk-yrs)    Types: Cigarettes    Start date: 01/28/1976    Quit date: 01/28/1988    Years since quitting: 35.7   Smokeless tobacco: Never  Substance and Sexual Activity   Alcohol use: No   Drug use: No   Sexual activity: Not Currently  Other Topics Concern   Not on file  Social History Narrative   1 grandchild at care link--Angelia   Lives with great-granddaughter   Social Drivers of Health   Financial Resource Strain: Low Risk  (10/08/2022)   Overall Financial Resource Strain (CARDIA)    Difficulty of Paying Living Expenses: Not hard at all  Food Insecurity: No Food Insecurity (09/09/2023)   Hunger Vital Sign    Worried About Running Out of Food in the Last Year: Never true    Ran Out of Food in the Last Year: Never true  Transportation Needs: No Transportation Needs (09/09/2023)   PRAPARE - Administrator, Civil Service (Medical): No    Lack of Transportation (Non-Medical): No  Physical Activity: Inactive (10/08/2022)   Exercise Vital Sign    Days of Exercise per Week: 0 days    Minutes of Exercise per Session: 0 min  Stress: No Stress Concern Present (10/08/2022)   [employer] of Occupational Health - Occupational Stress Questionnaire    Feeling of Stress : Not at all  Social Connections: Socially Isolated (10/08/2022)   Social Connection and Isolation Panel    Frequency of Communication with Friends and Family: More than three times a week    Frequency of Social Gatherings with Friends and Family: Twice a week    Attends Religious Services: Never    Database administrator or Organizations: No    Attends Banker Meetings: Never    Marital Status: Widowed  Intimate Partner Violence: Not At Risk (09/09/2023)   Humiliation, Afraid, Rape, and Kick questionnaire    Fear of Current or Ex-Partner: No    Emotionally Abused: No     Physically Abused: No    Sexually Abused: No    Outpatient Medications Prior to Visit  Medication Sig Dispense Refill   amoxicillin -clavulanate (AUGMENTIN ) 400-57 MG/5ML suspension Take 10.9 mLs (875 mg total) by mouth 2 (two) times daily. 200 mL 0   hyoscyamine  (LEVBID ) 0.375 MG 12 hr tablet Take 0.375 mg by mouth 2 (two) times daily as needed. Stomach pain     lactulose  (CHRONULAC ) 10 GM/15ML solution Take 45 mLs (30 g total) by  mouth 2 (two) times daily as needed for moderate constipation. 236 mL 0   omeprazole  (PRILOSEC) 40 MG capsule Take 1 capsule (40 mg total) by mouth 2 (two) times daily. 60 capsule 3   oxyCODONE -acetaminophen  (PERCOCET) 7.5-325 MG tablet Take 1 tablet by mouth every 8 (eight) hours as needed for severe pain (pain score 7-10).     promethazine  (PHENERGAN ) 12.5 MG tablet Take 1 tablet (12.5 mg total) by mouth every 6 (six) hours as needed for nausea or vomiting. 30 tablet 0   albuterol  (VENTOLIN  HFA) 108 (90 Base) MCG/ACT inhaler Inhale 2 puffs into the lungs every 6 (six) hours as needed for wheezing or shortness of breath. (Patient not taking: Reported on 10/23/2023)     OXYGEN  Inhale 2 L into the lungs as needed. (Patient not taking: Reported on 10/23/2023)     Zoster Vaccine Adjuvanted (SHINGRIX ) injection Inject 0.5mg  IM now and again in 2-6 months. (Patient not taking: Reported on 10/23/2023) 0.5 mL 1   doxycycline  (VIBRA -TABS) 100 MG tablet Take 1 tablet (100 mg total) by mouth 2 (two) times daily. (Patient not taking: Reported on 10/23/2023) 14 tablet 0   Multiple Vitamins-Minerals (MULTIVITAMIN WITH MINERALS) tablet Take 1 tablet by mouth daily. (Patient not taking: Reported on 10/23/2023) 30 tablet    Facility-Administered Medications Prior to Visit  Medication Dose Route Frequency Provider Last Rate Last Admin   denosumab  (PROLIA ) injection 60 mg  60 mg Subcutaneous Once O'Sullivan, [name], NP        Allergies  Allergen Reactions   Cefdinir  Diarrhea   Codeine  Diarrhea and Nausea And Vomiting    REACTION: n/v/d, HA   Influenza Vaccines     Stomach pain, headache   Morphine Diarrhea and Nausea And Vomiting    REACTION: n/v/d, HA   Sulfa  Antibiotics Nausea Only    Sick     Zoledronic Acid Swelling    REACTION: Severe edema   Sulfasalazine Nausea Only    Sick     Review of Systems  Constitutional:  Negative for fever and malaise/fatigue.  HENT:  Negative for congestion.   Eyes:  Negative for blurred vision.  Respiratory:  Negative for cough and shortness of breath.   Cardiovascular:  Negative for chest pain, palpitations and leg swelling.  Gastrointestinal:  Negative for vomiting.  Musculoskeletal:  Negative for back pain.  Skin:  Negative for rash.  Neurological:  Negative for loss of consciousness and headaches.  Psychiatric/Behavioral:  Positive for memory loss.        Objective:    Physical Exam Vitals and nursing note reviewed.  Constitutional:      General: She is not in acute distress.    Appearance: Normal appearance. She is well-developed.  HENT:     Head: Normocephalic and atraumatic.  Eyes:     General: No scleral icterus.       Right eye: No discharge.        Left eye: No discharge.  Cardiovascular:     Rate and Rhythm: Normal rate and regular rhythm.     Heart sounds: Murmur heard.  Pulmonary:     Effort: Pulmonary effort is normal. No respiratory distress.     Breath sounds: Normal breath sounds.  Abdominal:     Tenderness: There is no abdominal tenderness. There is no guarding or rebound.  Musculoskeletal:        General: Normal range of motion.     Cervical back: Normal range of motion and neck supple.  Right lower leg: No edema.     Left lower leg: No edema.  Skin:    General: Skin is warm and dry.  Neurological:     Mental Status: She is alert and oriented to person, place, and time.  Psychiatric:        Mood and Affect: Mood normal.        Behavior: Behavior normal.        Thought Content:  Thought content normal.        Judgment: Judgment normal.     BP 120/80 (BP Location: Left Arm, Patient Position: Sitting, Cuff Size: Small)   Pulse 79   Temp 98.2 F (36.8 C) (Oral)   Resp 16   Ht 4' 11 (1.499 m)   Wt 86 lb 9.6 oz (39.3 kg)   SpO2 95%   BMI 17.49 kg/m  Wt Readings from Last 3 Encounters:  10/23/23 86 lb 9.6 oz (39.3 kg)  10/14/23 87 lb 4.8 oz (39.6 kg)  09/09/23 87 lb 3.2 oz (39.6 kg)    Diabetic Foot Exam - Simple   No data filed    Lab Results  Component Value Date   WBC 3.6 (L) 10/14/2023   HGB 9.6 (L) 10/14/2023   HCT 32.6 (L) 10/14/2023   PLT 129 (L) 10/14/2023   GLUCOSE 85 10/14/2023   CHOL  07/07/2009    132        ATP III CLASSIFICATION:  <200     mg/dL   Desirable  799-760  mg/dL   Borderline High  >=759    mg/dL   High          TRIG 74 07/07/2009   HDL 35 (L) 07/07/2009   LDLCALC  07/07/2009    82        Total Cholesterol/HDL:CHD Risk Coronary Heart Disease Risk Table                     Men   Women  1/2 Average Risk   3.4   3.3  Average Risk       5.0   4.4  2 X Average Risk   9.6   7.1  3 X Average Risk  23.4   11.0        Use the calculated Patient Ratio above and the CHD Risk Table to determine the patient's CHD Risk.        ATP III CLASSIFICATION (LDL):  <100     mg/dL   Optimal  899-870  mg/dL   Near or Above                    Optimal  130-159  mg/dL   Borderline  839-810  mg/dL   High  >809     mg/dL   Very High   ALT 5 90/82/7974   AST 21 10/14/2023   NA 145 10/14/2023   K 4.0 10/14/2023   CL 110 10/14/2023   CREATININE 0.97 10/14/2023   BUN 19 10/14/2023   CO2 21 (L) 10/14/2023   TSH 1.77 05/27/2023   INR 1.0 10/02/2020    Lab Results  Component Value Date   TSH 1.77 05/27/2023   Lab Results  Component Value Date   WBC 3.6 (L) 10/14/2023   HGB 9.6 (L) 10/14/2023   HCT 32.6 (L) 10/14/2023   MCV 94.2 10/14/2023   PLT 129 (L) 10/14/2023   Lab Results  Component Value Date   NA 145 10/14/2023  K 4.0 10/14/2023   CO2 21 (L) 10/14/2023   GLUCOSE 85 10/14/2023   BUN 19 10/14/2023   CREATININE 0.97 10/14/2023   BILITOT <0.2 10/14/2023   ALKPHOS 111 10/14/2023   AST 21 10/14/2023   ALT 5 10/14/2023   PROT 6.5 10/14/2023   ALBUMIN 3.9 10/14/2023   CALCIUM  9.1 10/14/2023   ANIONGAP 13 10/14/2023   GFR 39.42 (L) 11/06/2020   Lab Results  Component Value Date   CHOL  07/07/2009    132        ATP III CLASSIFICATION:  <200     mg/dL   Desirable  799-760  mg/dL   Borderline High  >=759    mg/dL   High          Lab Results  Component Value Date   HDL 35 (L) 07/07/2009   Lab Results  Component Value Date   LDLCALC  07/07/2009    82        Total Cholesterol/HDL:CHD Risk Coronary Heart Disease Risk Table                     Men   Women  1/2 Average Risk   3.4   3.3  Average Risk       5.0   4.4  2 X Average Risk   9.6   7.1  3 X Average Risk  23.4   11.0        Use the calculated Patient Ratio above and the CHD Risk Table to determine the patient's CHD Risk.        ATP III CLASSIFICATION (LDL):  <100     mg/dL   Optimal  899-870  mg/dL   Near or Above                    Optimal  130-159  mg/dL   Borderline  839-810  mg/dL   High  >809     mg/dL   Very High   Lab Results  Component Value Date   TRIG 74 07/07/2009   Lab Results  Component Value Date   CHOLHDL 3.8 07/07/2009   No results found for: HGBA1C     Assessment & Plan:  History of confusion -     POCT Urinalysis Dipstick (Automated)  B12 deficiency -     POCT Urinalysis Dipstick (Automated) -     CBC with Differential/Platelet -     Comprehensive metabolic panel with GFR -     Iron, TIBC and Ferritin Panel -     Vitamin B12  Gastroesophageal reflux disease without esophagitis -     CBC with Differential/Platelet -     Iron, TIBC and Ferritin Panel -     Vitamin B12 -     VITAMIN D  25 Hydroxy (Vit-D Deficiency, Fractures)  Anemia, unspecified type  Assessment and Plan Assessment &  Plan Left acute sinus infection   She has a left acute sinus infection with associated confusion and is currently on Augmentin , showing improvement. Continue Augmentin  as prescribed.  Possible urinary tract infection   Urine dip showed bacteria and hematuria, but no culture was done. Symptoms include confusion and possible dehydration. Augmentin  should address any infection if present. Attempt to obtain a urine sample for culture. Send the sample home if unable to collect in the office. Continue Augmentin  as prescribed.  Anemia, unspecified   Chronic anemia with hemoglobin at 9.6, consistent with past levels. No workup with a gastroenterologist.  No current symptoms of gastrointestinal bleeding or heartburn. Low B12 levels. Recheck hemoglobin levels and discuss with primary provider [name] regarding anemia management.  Abnormal weight loss and decreased appetite   Recent weight loss of 1 pound since the last visit, with decreased appetite during illness. Appetite is improving. Monitor weight and appetite.    Dima Mini R Lowne Chase, DO

## 2023-10-24 LAB — CBC WITH DIFFERENTIAL/PLATELET
Absolute Lymphocytes: 731 {cells}/uL — ABNORMAL LOW (ref 850–3900)
Absolute Monocytes: 521 {cells}/uL (ref 200–950)
Basophils Absolute: 21 {cells}/uL (ref 0–200)
Basophils Relative: 0.5 %
Eosinophils Absolute: 109 {cells}/uL (ref 15–500)
Eosinophils Relative: 2.6 %
HCT: 33.2 % — ABNORMAL LOW (ref 35.0–45.0)
Hemoglobin: 10.1 g/dL — ABNORMAL LOW (ref 11.7–15.5)
MCH: 27.5 pg (ref 27.0–33.0)
MCHC: 30.4 g/dL — ABNORMAL LOW (ref 32.0–36.0)
MCV: 90.5 fL (ref 80.0–100.0)
MPV: 11.9 fL (ref 7.5–12.5)
Monocytes Relative: 12.4 %
Neutro Abs: 2818 {cells}/uL (ref 1500–7800)
Neutrophils Relative %: 67.1 %
Platelets: 121 Thousand/uL — ABNORMAL LOW (ref 140–400)
RBC: 3.67 Million/uL — ABNORMAL LOW (ref 3.80–5.10)
RDW: 16 % — ABNORMAL HIGH (ref 11.0–15.0)
Total Lymphocyte: 17.4 %
WBC: 4.2 Thousand/uL (ref 3.8–10.8)

## 2023-10-24 LAB — COMPREHENSIVE METABOLIC PANEL WITH GFR
AG Ratio: 1.4 (calc) (ref 1.0–2.5)
ALT: 3 U/L — ABNORMAL LOW (ref 6–29)
AST: 14 U/L (ref 10–35)
Albumin: 3.9 g/dL (ref 3.6–5.1)
Alkaline phosphatase (APISO): 100 U/L (ref 37–153)
BUN: 17 mg/dL (ref 7–25)
CO2: 27 mmol/L (ref 20–32)
Calcium: 9 mg/dL (ref 8.6–10.4)
Chloride: 103 mmol/L (ref 98–110)
Creat: 0.94 mg/dL (ref 0.60–0.95)
Globulin: 2.8 g/dL (ref 1.9–3.7)
Glucose, Bld: 92 mg/dL (ref 65–99)
Potassium: 4.2 mmol/L (ref 3.5–5.3)
Sodium: 142 mmol/L (ref 135–146)
Total Bilirubin: 0.2 mg/dL (ref 0.2–1.2)
Total Protein: 6.7 g/dL (ref 6.1–8.1)
eGFR: 56 mL/min/1.73m2 — ABNORMAL LOW (ref >=60)

## 2023-10-24 LAB — IRON,TIBC AND FERRITIN PANEL
%SAT: 8 % — ABNORMAL LOW (ref 16–45)
Ferritin: 25 ng/mL (ref 16–288)
Iron: 28 ug/dL — ABNORMAL LOW (ref 45–160)
TIBC: 361 ug/dL (ref 250–450)

## 2023-10-24 LAB — VITAMIN D 25 HYDROXY (VIT D DEFICIENCY, FRACTURES): Vit D, 25-Hydroxy: 27 ng/mL — ABNORMAL LOW (ref 30–100)

## 2023-10-24 LAB — VITAMIN B12: Vitamin B-12: 472 pg/mL (ref 200–1100)

## 2023-10-26 ENCOUNTER — Telehealth: Payer: Self-pay

## 2023-10-26 NOTE — Telephone Encounter (Signed)
Patient's daughter notified of this information.

## 2023-10-26 NOTE — Telephone Encounter (Signed)
 Copied from CRM #[id number]. Topic: Clinical - Medication Question >> Oct 26, 2023 10:39 AM Pinkey ORN wrote: Reason for CRM: Antibiotic >> Oct 26, 2023 10:43 AM Pinkey ORN wrote: [name] [name] daughter called on behalf of patient, states she was given a liquid antibiotic because she got chocked up on the tablets. But also states the patient has already taken 6 tablets and wants to know should she take all the liquid since she's already taken the 6 tablets. Please follow up with daughter at [phone]

## 2023-10-28 ENCOUNTER — Inpatient Hospital Stay: Admitting: Family

## 2023-11-04 NOTE — Progress Notes (Signed)
 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 Wound had not been healing well. Patient does have headaches that is a 5 out of 10. Takes over the counter tylenol . 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   SAFETY ISSUES: Prior radiation? None Pacemaker/ICD? None Possible current pregnancy? None Is the patient on methotrexate? None

## 2023-11-07 NOTE — Progress Notes (Signed)
 Radiation Oncology         [phone]) [phone] ________________________________  Follow-Up New Visit   Name: [name] [name] MRN: [id number]  Date: 11/09/2023  DOB: 1925-04-24  CC:O'Sullivan, Eleanor, NP  [name] Husband, MD   REFERRING PHYSICIAN: [name] Husband, MD  DIAGNOSIS: No diagnosis found.   Cancer Staging  No matching staging information was found for the patient.  Basal cell carcinoma - lesion located on left lower leg, s/p radiation therapy   CHIEF COMPLAINT: Here to discuss management of skin cancer  HISTORY OF PRESENT ILLNESS::[name] [name] is a 89 y.o. female who is known to us  for her history of BCC of the left lower extremity, s/p radiation therapy completed on 10/09/23. She returns today to ***. Her initial history is detailed as follows.   She presented to the [facility] on 07/14/23 for evaluation of a wound on her left lower leg that had been present for approximately 10-11 months. She was also seen by her PCP for this is April of 2025 and was given an Rx for doxycycline  before being referred to the [facility] for further management.   A skin bunch biopsy of the left leg wound was collected at that time and showed findings consistent with basal cell carcinoma (nodular pattern) extending to the peripheral margin   PREVIOUS RADIATION THERAPY: Yes   Intent: Palliative  Radiation Treatment Dates: First Treatment Date: 2023-09-30 -- Last Treatment Date: 2023-10-09 Radiation Treatment Dates:  Plan Name: Ext_L_PreTib Site: Tibia, Left Technique: Electron Mode: Electron Dose Per Fraction: 3.6 Gy Prescribed Dose (Delivered / Prescribed): 18 Gy / 18 Gy Prescribed Fxs (Delivered / Prescribed): 5 / 5  PAST MEDICAL HISTORY:  has a past medical history of Abdominal pain, Cardiac arrhythmia, CHF (congestive heart failure) (HCC), Choledocholithiasis, Chronic anemia, Chronic back pain, COPD (chronic obstructive pulmonary disease) (HCC), Daily  headache, GERD (gastroesophageal reflux disease), HCAP (healthcare-associated pneumonia) (03/28/2014), Hiatal hernia, Hyperlipidemia, Hypertension, On home oxygen  therapy, Osteoarthritis, Osteoporosis, Pancolitis, Pancreatitis, Pneumonia (1930's; 2011 X 2; 02/2014), PONV (postoperative nausea and vomiting), PUD (peptic ulcer disease), Reactive airway disease that is not asthma, Recurrent UTI (urinary tract infection), and Scoliosis deformity of spine.    PAST SURGICAL HISTORY: Past Surgical History:  Procedure Laterality Date   ABDOMINAL HYSTERECTOMY  1970s   BACK SURGERY     x 5   BREAST CYST EXCISION Right 1960's   CERVICAL LAMINECTOMY  1970s   CHOLECYSTECTOMY  2008   ERCP W/ SPHICTEROTOMY  02/2010   ESOPHAGOGASTRODUODENOSCOPY N/A 11/14/2013   Procedure: ESOPHAGOGASTRODUODENOSCOPY (EGD);  Surgeon: Norleen JAYSON Hint, MD;  Location: [facility] ENDOSCOPY;  Service: Endoscopy;  Laterality: N/A;   FLEXIBLE SIGMOIDOSCOPY N/A 01/24/2013   Procedure: FLEXIBLE SIGMOIDOSCOPY;  Surgeon: Norleen JAYSON Hint, MD;  Location: [facility] ENDOSCOPY;  Service: Endoscopy;  Laterality: N/A;   PAIN PUMP IMPLANTATION  ~ 2010   back, dilaudid  and bupravacaine   SPINAL CORD STIMULATOR IMPLANT  ~ 2009   SPINE SURGERY  1970's,1995,2007    FAMILY HISTORY: family history includes Cancer in her mother; Heart disease in her father; Hypertension in an other family member.  SOCIAL HISTORY:  reports that she quit smoking about 35 years ago. Her smoking use included cigarettes. She started smoking about 47 years ago. She has a 12 pack-year smoking history. She has never used smokeless tobacco. She reports that she does not drink alcohol and does not use drugs.  ALLERGIES: Cefdinir , Codeine, Influenza vaccines, Morphine, Sulfa  antibiotics, Zoledronic acid, and Sulfasalazine  MEDICATIONS:  Current Outpatient Medications  Medication Sig Dispense Refill   amoxicillin -clavulanate (AUGMENTIN ) 400-57 MG/5ML suspension Take 10.9 mLs (875 mg total) by  mouth 2 (two) times daily. 200 mL 0   hyoscyamine  (LEVBID ) 0.375 MG 12 hr tablet Take 0.375 mg by mouth 2 (two) times daily as needed. Stomach pain     lactulose  (CHRONULAC ) 10 GM/15ML solution Take 45 mLs (30 g total) by mouth 2 (two) times daily as needed for moderate constipation. 236 mL 0   omeprazole  (PRILOSEC) 40 MG capsule Take 1 capsule (40 mg total) by mouth 2 (two) times daily. 60 capsule 3   oxyCODONE -acetaminophen  (PERCOCET) 7.5-325 MG tablet Take 1 tablet by mouth every 8 (eight) hours as needed for severe pain (pain score 7-10).     promethazine  (PHENERGAN ) 12.5 MG tablet Take 1 tablet (12.5 mg total) by mouth every 6 (six) hours as needed for nausea or vomiting. 30 tablet 0   Current Facility-Administered Medications  Medication Dose Route Frequency Provider Last Rate Last Admin   denosumab  (PROLIA ) injection 60 mg  60 mg Subcutaneous Once Daryl Setter, NP        REVIEW OF SYSTEMS:  Notable for that above.   PHYSICAL EXAM:  vitals were not taken for this visit.   General: Alert and oriented, in no acute distress *** HEENT: Head is normocephalic. Extraocular movements are intact. Oropharynx is clear. Neck: Neck is supple, no palpable cervical or supraclavicular lymphadenopathy. Heart: Regular in rate and rhythm with no murmurs, rubs, or gallops. Chest: Clear to auscultation bilaterally, with no rhonchi, wheezes, or rales. Abdomen: Soft, nontender, nondistended, with no rigidity or guarding. Extremities: No cyanosis or edema. Lymphatics: see Neck Exam Skin: No concerning lesions. Musculoskeletal: symmetric strength and muscle tone throughout. Neurologic: Cranial nerves II through XII are grossly intact. No obvious focalities. Speech is fluent. Coordination is intact. Psychiatric: Judgment and insight are intact. Affect is appropriate.   ECOG = ***  0 - Asymptomatic (Fully active, able to carry on all predisease activities without restriction)  1 - Symptomatic but  completely ambulatory (Restricted in physically strenuous activity but ambulatory and able to carry out work of a light or sedentary nature. For example, light housework, office work)  2 - Symptomatic, <50% in bed during the day (Ambulatory and capable of all self care but unable to carry out any work activities. Up and about more than 50% of waking hours)  3 - Symptomatic, >50% in bed, but not bedbound (Capable of only limited self-care, confined to bed or chair 50% or more of waking hours)  4 - Bedbound (Completely disabled. Cannot carry on any self-care. Totally confined to bed or chair)  5 - Death   Raylene MM, Creech RH, Tormey DC, et al. [phone]). Toxicity and response criteria of the [facility]. Am. DOROTHA Bridges. Oncol. 5 (6): 649-55   LABORATORY DATA:  Lab Results  Component Value Date   WBC 4.2 10/23/2023   HGB 10.1 (L) 10/23/2023   HCT 33.2 (L) 10/23/2023   MCV 90.5 10/23/2023   PLT 121 (L) 10/23/2023   CMP     Component Value Date/Time   NA 142 10/23/2023 1449   K 4.2 10/23/2023 1449   CL 103 10/23/2023 1449   CO2 27 10/23/2023 1449   GLUCOSE 92 10/23/2023 1449   BUN 17 10/23/2023 1449   CREATININE 0.94 10/23/2023 1449   CALCIUM  9.0 10/23/2023 1449   PROT 6.7 10/23/2023 1449   ALBUMIN 3.9 10/14/2023 2038   AST  14 10/23/2023 1449   ALT 3 (L) 10/23/2023 1449   ALKPHOS 111 10/14/2023 2038   BILITOT 0.2 10/23/2023 1449   GFR 39.42 (L) 11/06/2020 1457   EGFR 56 (L) 10/23/2023 1449   GFRNONAA 53 (L) 10/14/2023 2038   GFRNONAA 57 (L) 04/27/2017 1446         RADIOGRAPHY: CT Head Wo Contrast Result Date: 10/15/2023 CLINICAL DATA:  Mental status change, unknown cause. EXAM: CT HEAD WITHOUT CONTRAST TECHNIQUE: Contiguous axial images were obtained from the base of the skull through the vertex without intravenous contrast. RADIATION DOSE REDUCTION: This exam was performed according to the departmental dose-optimization program which includes automated  exposure control, adjustment of the mA and/or kV according to patient size and/or use of iterative reconstruction technique. COMPARISON:  CT head November 30, 2020. FINDINGS: Brain: No evidence of acute infarction, hemorrhage, hydrocephalus, extra-axial collection or mass lesion/mass effect. Cerebral atrophy. Patchy white matter within sees are compatible with chronic microvascular disease. Vascular: No hyperdense vessel or unexpected calcification. Skull: No acute fracture. Sinuses/Orbits: Opacified left sphenoid sinus with surrounding osteitis. No acute orbital findings. Other: No mastoid effusions. IMPRESSION: 1. No evidence of acute intracranial abnormality. 2. Chronic microvascular ischemic disease and cerebral atrophy (ICD10-.9). 3. Chronic left sphenoid sinusitis. Electronically Signed   By: Gilmore GORMAN Molt M.D.   On: 10/15/2023 00:56   DG Chest Port 1 View Result Date: 10/14/2023 CLINICAL DATA:  Altered mental status, assess for pneumonia. EXAM: PORTABLE CHEST 1 VIEW COMPARISON:  11/06/2020 FINDINGS: The cardiomediastinal contours are stable. Aortic atherosclerosis and tortuosity, unchanged. Pulmonary vasculature is normal. No consolidation, pleural effusion, or pneumothorax. Scoliosis. Spinal stimulator in place per IMPRESSION: No acute chest findings.  Particularly, no evidence of pneumonia. Electronically Signed   By: Andrea Gasman M.D.   On: 10/14/2023 20:19      IMPRESSION/PLAN:***    On date of service, in total, I spent *** minutes on this encounter. Patient was seen in person.   __________________________________________   Lauraine Golden, MD  This document serves as a record of services personally performed by Lauraine Golden, MD. It was created on her behalf by Dorthy Fuse, a trained medical scribe. The creation of this record is based on the scribe's personal observations and the provider's statements to them. This document has been checked and approved by the attending  provider.

## 2023-11-09 ENCOUNTER — Ambulatory Visit
Admission: RE | Admit: 2023-11-09 | Discharge: 2023-11-09 | Disposition: A | Discharge status: Home / Self Care | Payer: Self-pay | Source: Ambulatory Visit | Attending: Radiation Oncology | Admitting: Radiation Oncology

## 2023-11-09 ENCOUNTER — Encounter: Payer: Self-pay | Admitting: Radiation Oncology

## 2023-11-09 ENCOUNTER — Ambulatory Visit: Payer: Self-pay | Admitting: Radiation Oncology

## 2023-11-09 VITALS — BP 147/88 | HR 88 | Temp 97.5°F | Resp 20 | Ht 59.0 in | Wt 85.6 lb

## 2023-11-09 DIAGNOSIS — C44702 Unspecified malignant neoplasm of skin of right lower limb, including hip: Secondary | ICD-10-CM | POA: Diagnosis present

## 2023-11-09 DIAGNOSIS — Z87891 Personal history of nicotine dependence: Secondary | ICD-10-CM | POA: Insufficient documentation

## 2023-11-09 DIAGNOSIS — Z79899 Other long term (current) drug therapy: Secondary | ICD-10-CM | POA: Diagnosis not present

## 2023-11-09 DIAGNOSIS — Z8719 Personal history of other diseases of the digestive system: Secondary | ICD-10-CM | POA: Insufficient documentation

## 2023-11-09 DIAGNOSIS — Z8711 Personal history of peptic ulcer disease: Secondary | ICD-10-CM | POA: Insufficient documentation

## 2023-11-09 DIAGNOSIS — Z809 Family history of malignant neoplasm, unspecified: Secondary | ICD-10-CM | POA: Insufficient documentation

## 2023-11-09 DIAGNOSIS — E785 Hyperlipidemia, unspecified: Secondary | ICD-10-CM | POA: Insufficient documentation

## 2023-11-09 DIAGNOSIS — I509 Heart failure, unspecified: Secondary | ICD-10-CM | POA: Insufficient documentation

## 2023-11-09 DIAGNOSIS — M810 Age-related osteoporosis without current pathological fracture: Secondary | ICD-10-CM | POA: Diagnosis not present

## 2023-11-09 DIAGNOSIS — C44719 Basal cell carcinoma of skin of left lower limb, including hip: Secondary | ICD-10-CM

## 2023-11-09 DIAGNOSIS — I700 Atherosclerosis of aorta: Secondary | ICD-10-CM | POA: Diagnosis not present

## 2023-11-09 DIAGNOSIS — Z8701 Personal history of pneumonia (recurrent): Secondary | ICD-10-CM | POA: Insufficient documentation

## 2023-11-09 DIAGNOSIS — J4489 Other specified chronic obstructive pulmonary disease: Secondary | ICD-10-CM | POA: Insufficient documentation

## 2023-11-09 DIAGNOSIS — I110 Hypertensive heart disease with heart failure: Secondary | ICD-10-CM | POA: Insufficient documentation

## 2023-11-09 DIAGNOSIS — M1990 Unspecified osteoarthritis, unspecified site: Secondary | ICD-10-CM | POA: Diagnosis not present

## 2023-11-09 DIAGNOSIS — Z923 Personal history of irradiation: Secondary | ICD-10-CM | POA: Diagnosis not present

## 2023-11-09 DIAGNOSIS — G8929 Other chronic pain: Secondary | ICD-10-CM | POA: Diagnosis not present

## 2023-11-27 ENCOUNTER — Telehealth (INDEPENDENT_AMBULATORY_CARE_PROVIDER_SITE_OTHER): Admitting: Family

## 2023-11-27 ENCOUNTER — Telehealth: Payer: Self-pay | Admitting: Family

## 2023-11-27 VITALS — Ht 59.0 in | Wt 85.0 lb

## 2023-11-27 DIAGNOSIS — M069 Rheumatoid arthritis, unspecified: Secondary | ICD-10-CM | POA: Insufficient documentation

## 2023-11-27 DIAGNOSIS — E440 Moderate protein-calorie malnutrition: Secondary | ICD-10-CM

## 2023-11-27 DIAGNOSIS — F03A0 Unspecified dementia, mild, without behavioral disturbance, psychotic disturbance, mood disturbance, and anxiety: Secondary | ICD-10-CM | POA: Insufficient documentation

## 2023-11-27 DIAGNOSIS — K219 Gastro-esophageal reflux disease without esophagitis: Secondary | ICD-10-CM

## 2023-11-27 DIAGNOSIS — M818 Other osteoporosis without current pathological fracture: Secondary | ICD-10-CM

## 2023-11-27 MED ORDER — OMEPRAZOLE 20 MG PO CPDR: 20.0000 mg | DELAYED_RELEASE_CAPSULE | Freq: Every day | ORAL | Status: AC

## 2023-11-27 NOTE — Assessment & Plan Note (Signed)
 Stable with omeprazole  otc.

## 2023-11-27 NOTE — Assessment & Plan Note (Signed)
 Continues ensure, appetite seems sufficient.

## 2023-11-27 NOTE — Assessment & Plan Note (Signed)
 Stable not on medication for RA.  Monitor.

## 2023-11-27 NOTE — Assessment & Plan Note (Signed)
 Stable

## 2023-11-27 NOTE — Progress Notes (Signed)
 MyChart Video Visit    Virtual Visit via Video Note    Patient location: Home. Patient and provider in visit Provider location: Office  I discussed the limitations of evaluation and management by telemedicine and the availability of in person appointments. The patient expressed understanding and agreed to proceed.  Visit Date: 11/27/2023  Today's healthcare provider: Eleanor GORMAN Ponto, NP     Subjective:    Patient ID: [name] [name], female    DOB: Mar 01, 1925, 89 y.o.   MRN: [id number]  Chief Complaint  Patient presents with   Follow-up    HPI Patient is a 89 yr old female who presents today for virtual visit with daughter.  Daughter reports that patient is eating 2 meals/day plus one can of ensure and one snack. Weight has been stable.    She continues to have chronic pain and this is treated by the pain clinic with oxycodone .   Past Medical History:  Diagnosis Date   Abdominal pain    Resolved   Cardiac arrhythmia    CHF (congestive heart failure) (HCC)    Diastolic  on ECHO 2013   Choledocholithiasis    Chronic anemia    Chronic back pain    COPD (chronic obstructive pulmonary disease) (HCC)    oxygen  dependent at  night 2LPM   Daily headache    GERD (gastroesophageal reflux disease)    HCAP (healthcare-associated pneumonia) 03/28/2014   Hiatal hernia    Hyperlipidemia    Hypertension    On home oxygen  therapy    1L during the day; 2L q hs (03/28/2014)   Osteoarthritis    pretty much q where    Osteoporosis    Pancolitis    Infectious vs. inflammatory   Pancreatitis    History of   Pneumonia 1930's; 2011 X 2; 02/2014   PONV (postoperative nausea and vomiting)    PUD (peptic ulcer disease)    Reactive airway disease that is not asthma    Recurrent UTI (urinary tract infection)    here lately (03/28/2014)   Scoliosis deformity of spine    history of intractable back pain    Past Surgical History:  Procedure Laterality Date   ABDOMINAL  HYSTERECTOMY  1970s   BACK SURGERY     x 5   BREAST CYST EXCISION Right 1960's   CERVICAL LAMINECTOMY  1970s   CHOLECYSTECTOMY  2008   ERCP W/ SPHICTEROTOMY  02/2010   ESOPHAGOGASTRODUODENOSCOPY N/A 11/14/2013   Procedure: ESOPHAGOGASTRODUODENOSCOPY (EGD);  Surgeon: Norleen JAYSON Hint, MD;  Location: [facility] ENDOSCOPY;  Service: Endoscopy;  Laterality: N/A;   FLEXIBLE SIGMOIDOSCOPY N/A 01/24/2013   Procedure: FLEXIBLE SIGMOIDOSCOPY;  Surgeon: Norleen JAYSON Hint, MD;  Location: [facility] ENDOSCOPY;  Service: Endoscopy;  Laterality: N/A;   PAIN PUMP IMPLANTATION  ~ 2010   back, dilaudid  and bupravacaine   SPINAL CORD STIMULATOR IMPLANT  ~ 2009   SPINE SURGERY  1970's,1995,2007    Family History  Problem Relation Age of Onset   Cancer Mother        uterine   Heart disease Father    Hypertension Other    Osteoporosis Neg Hx     Social History   Socioeconomic History   Marital status: Widowed    Spouse name: Not on file   Number of children: 3   Years of education: Not on file   Highest education level: Not on file  Occupational History    Employer: RETIRED  Tobacco Use   Smoking status: Former  Current packs/day: 0.00    Average packs/day: 1 pack/day for 12.0 years (12.0 ttl pk-yrs)    Types: Cigarettes    Start date: 01/28/1976    Quit date: 01/28/1988    Years since quitting: 35.8   Smokeless tobacco: Never  Substance and Sexual Activity   Alcohol use: No   Drug use: No   Sexual activity: Not Currently  Other Topics Concern   Not on file  Social History Narrative   1 grandchild at care link--Angelia   Lives with great-granddaughter   Social Drivers of Health   Financial Resource Strain: Low Risk  (10/08/2022)   Overall Financial Resource Strain (CARDIA)    Difficulty of Paying Living Expenses: Not hard at all  Food Insecurity: No Food Insecurity (11/09/2023)   Hunger Vital Sign    Worried About Running Out of Food in the Last Year: Never true    Ran Out of Food in the Last Year:  Never true  Transportation Needs: No Transportation Needs (11/09/2023)   PRAPARE - Administrator, Civil Service (Medical): No    Lack of Transportation (Non-Medical): No  Physical Activity: Inactive (10/08/2022)   Exercise Vital Sign    Days of Exercise per Week: 0 days    Minutes of Exercise per Session: 0 min  Stress: No Stress Concern Present (10/08/2022)   [employer] of Occupational Health - Occupational Stress Questionnaire    Feeling of Stress : Not at all  Social Connections: Socially Isolated (10/08/2022)   Social Connection and Isolation Panel    Frequency of Communication with Friends and Family: More than three times a week    Frequency of Social Gatherings with Friends and Family: Twice a week    Attends Religious Services: Never    Database Administrator or Organizations: No    Attends Banker Meetings: Never    Marital Status: Widowed  Intimate Partner Violence: Not At Risk (11/09/2023)   Humiliation, Afraid, Rape, and Kick questionnaire    Fear of Current or Ex-Partner: No    Emotionally Abused: No    Physically Abused: No    Sexually Abused: No    Outpatient Medications Prior to Visit  Medication Sig Dispense Refill   hyoscyamine  (LEVBID ) 0.375 MG 12 hr tablet Take 0.375 mg by mouth 2 (two) times daily as needed. Stomach pain     lactulose  (CHRONULAC ) 10 GM/15ML solution Take 45 mLs (30 g total) by mouth 2 (two) times daily as needed for moderate constipation. 236 mL 0   omeprazole  (PRILOSEC) 40 MG capsule Take 1 capsule (40 mg total) by mouth 2 (two) times daily. 60 capsule 3   oxyCODONE -acetaminophen  (PERCOCET) 7.5-325 MG tablet Take 1 tablet by mouth every 8 (eight) hours as needed for severe pain (pain score 7-10).     promethazine  (PHENERGAN ) 12.5 MG tablet Take 1 tablet (12.5 mg total) by mouth every 6 (six) hours as needed for nausea or vomiting. 30 tablet 0   amoxicillin -clavulanate (AUGMENTIN ) 400-57 MG/5ML suspension Take 10.9  mLs (875 mg total) by mouth 2 (two) times daily. (Patient not taking: Reported on 11/09/2023) 200 mL 0   Facility-Administered Medications Prior to Visit  Medication Dose Route Frequency Provider Last Rate Last Admin   denosumab  (PROLIA ) injection 60 mg  60 mg Subcutaneous Once O'Sullivan, Huxton Glaus, NP        Allergies  Allergen Reactions   Cefdinir  Diarrhea   Codeine Diarrhea and Nausea And Vomiting    REACTION: n/v/d, HA  Influenza Vaccines     Stomach pain, headache   Morphine Diarrhea and Nausea And Vomiting    REACTION: n/v/d, HA   Sulfa  Antibiotics Nausea Only    Sick     Zoledronic Acid Swelling    REACTION: Severe edema   Sulfasalazine Nausea Only    Sick     ROS See HPI    Objective:    Physical Exam  Ht 4' 11 (1.499 m)   Wt 85 lb (38.6 kg)   BMI 17.17 kg/m  Wt Readings from Last 3 Encounters:  11/27/23 85 lb (38.6 kg)  11/09/23 85 lb 9.6 oz (38.8 kg)  10/23/23 86 lb 9.6 oz (39.3 kg)    Gen: Awake, alert, no acute distress Resp: Breathing is even and non-labored Psych: calm/pleasant demeanor Neuro: Alert and Oriented x 3, + facial symmetry, speech is clear.     Assessment & Plan:   Problem List Items Addressed This Visit       Unprioritized   Rheumatoid arthritis, involving unspecified site, unspecified whether rheumatoid factor present (HCC) - Primary   Stable not on medication for RA.  Monitor.      Protein-calorie malnutrition   Continues ensure, appetite seems sufficient.      Mild dementia without behavioral disturbance, psychotic disturbance, mood disturbance, or anxiety, unspecified dementia type (HCC)   Stable.       GERD   Stable with omeprazole  otc.        I have discontinued [place]'s amoxicillin -clavulanate. I am also having her maintain her omeprazole , hyoscyamine , lactulose , promethazine , and oxyCODONE -acetaminophen . We will continue to administer denosumab .  No orders of the defined types were placed in this  encounter.   I discussed the assessment and treatment plan with the patient. The patient was provided an opportunity to ask questions and all were answered. The patient agreed with the plan and demonstrated an understanding of the instructions.   The patient was advised to call back or seek an in-person evaluation if the symptoms worsen or if the condition fails to improve as anticipated.   Eleanor GORMAN Ponto, NP [place]  [facility] [phone] (phone) [phone] (fax)  [facility]

## 2023-11-27 NOTE — Telephone Encounter (Signed)
 Could you please check status of Prolia  injection for her?

## 2023-12-02 ENCOUNTER — Telehealth: Payer: Self-pay

## 2023-12-02 ENCOUNTER — Other Ambulatory Visit (HOSPITAL_COMMUNITY): Payer: Self-pay

## 2023-12-02 MED ORDER — DENOSUMAB 60 MG/ML ~~LOC~~ SOSY: 60.0000 mg | PREFILLED_SYRINGE | Freq: Once | SUBCUTANEOUS | Status: AC

## 2023-12-02 NOTE — Telephone Encounter (Signed)
 Prolia  has been initiated.  Not sure if I ever got the message.  It did look like it was a referral placed to the osteoporosis clinic but it was closed.  I will hope to have this back next week.

## 2023-12-02 NOTE — Telephone Encounter (Signed)
 Prolia VOB initiated via [email or web address]  Next Prolia inj DUE: NOW

## 2023-12-03 ENCOUNTER — Other Ambulatory Visit (HOSPITAL_COMMUNITY): Payer: Self-pay

## 2023-12-03 NOTE — Telephone Encounter (Signed)
 SABRA

## 2023-12-03 NOTE — Telephone Encounter (Signed)
 BUY AND BILL PA SUBMITTED VIA [employer].  Authorization Number : [id number]   PHARMACY COPAY: $645.79

## 2023-12-04 ENCOUNTER — Other Ambulatory Visit (HOSPITAL_COMMUNITY): Payer: Self-pay

## 2023-12-04 NOTE — Telephone Encounter (Signed)
 Pt ready for scheduling for PROLIA  on or after : 12/04/23  Option# 1: Buy/Bill (Office supplied medication)  Out-of-pocket cost due at time of clinic visit: $357  Number of injection/visits approved: 2  Primary: [employer]-MEDIC[employer] Prolia  co-insurance: 20% Admin fee co-insurance: 20%  Secondary: --- Prolia  co-insurance:  Admin fee co-insurance:   Medical Benefit Details: Date Benefits were checked: 12/02/23 Deductible: NO/ Coinsurance: 20%/ Admin Fee: 20%  Prior Auth: APPROVED PA# [id number] Expiration Date: 12/03/23-12/02/24   # of doses approved: 2 ----------------------------------------------------------------------- Option# 2- Med Obtained from pharmacy:  Pharmacy benefit: Copay $645.79 (Paid to pharmacy) Admin Fee: 20% (Pay at clinic)  Prior Auth: N/A PA# Expiration Date:   # of doses approved:   If patient wants fill through the pharmacy benefit please send prescription to: [facility], and include estimated need by date in rx notes. Pharmacy will ship medication directly to the office.  Patient NOT eligible for Prolia  Copay Card. Copay Card can make patient's cost as little as $25. Link to apply: [email or web address]://[email or web address]  ** This summary of benefits is an estimation of the patient's out-of-pocket cost. Exact cost may very based on individual plan coverage.

## 2023-12-07 NOTE — Telephone Encounter (Signed)
 Left message on machine to call back.  Pt can schedule at anytime, cost is $357 OOP.

## 2023-12-07 NOTE — Telephone Encounter (Unsigned)
 Copied from CRM [id number]. Topic: Appointments - Scheduling Inquiry for Clinic >> Dec 07, 2023 10:54 AM Suzen RAMAN wrote: Reason for CRM: please contact patient to schedule Prolia  injection.

## 2023-12-09 NOTE — Telephone Encounter (Signed)
 Left message on machine to call back

## 2023-12-10 NOTE — Telephone Encounter (Signed)
 Spoke with daughter and they declined Prolia  due to cost.

## 2023-12-10 NOTE — Telephone Encounter (Signed)
 Unfortunately, the only other class of medications that she could use does not appear to be covered by her insurance.

## 2023-12-10 NOTE — Telephone Encounter (Signed)
 Spoke with daughter and they declined Prolia  due to cost ($357).  Not sure if you have any other recommendations.

## 2023-12-11 NOTE — Telephone Encounter (Signed)
 Left detailed message on daughter Debbies machine.
